# Patient Record
Sex: Female | Born: 1945 | Race: Black or African American | Hispanic: No | Smoking: Never smoker
Health system: Southern US, Community
[De-identification: ages and names within clinical notes are randomized; demographics above are authoritative.]

## PROBLEM LIST (undated history)

## (undated) DIAGNOSIS — I10 Essential (primary) hypertension: Secondary | ICD-10-CM

## (undated) DIAGNOSIS — R000 Tachycardia, unspecified: Secondary | ICD-10-CM

## (undated) DIAGNOSIS — N6019 Diffuse cystic mastopathy of unspecified breast: Secondary | ICD-10-CM

## (undated) DIAGNOSIS — J40 Bronchitis, not specified as acute or chronic: Secondary | ICD-10-CM

## (undated) DIAGNOSIS — K589 Irritable bowel syndrome without diarrhea: Secondary | ICD-10-CM

## (undated) DIAGNOSIS — I82A11 Acute embolism and thrombosis of right axillary vein: Secondary | ICD-10-CM

## (undated) DIAGNOSIS — IMO0002 Reserved for concepts with insufficient information to code with codable children: Secondary | ICD-10-CM

## (undated) DIAGNOSIS — I499 Cardiac arrhythmia, unspecified: Secondary | ICD-10-CM

## (undated) DIAGNOSIS — Z780 Asymptomatic menopausal state: Secondary | ICD-10-CM

## (undated) DIAGNOSIS — K7689 Other specified diseases of liver: Secondary | ICD-10-CM

## (undated) DIAGNOSIS — F329 Major depressive disorder, single episode, unspecified: Secondary | ICD-10-CM

## (undated) DIAGNOSIS — R079 Chest pain, unspecified: Secondary | ICD-10-CM

## (undated) DIAGNOSIS — R7302 Impaired glucose tolerance (oral): Secondary | ICD-10-CM

## (undated) DIAGNOSIS — I742 Embolism and thrombosis of arteries of the upper extremities: Secondary | ICD-10-CM

## (undated) DIAGNOSIS — F32A Depression, unspecified: Secondary | ICD-10-CM

## (undated) DIAGNOSIS — K5792 Diverticulitis of intestine, part unspecified, without perforation or abscess without bleeding: Secondary | ICD-10-CM

## (undated) HISTORY — DX: Major depressive disorder, single episode, unspecified: F32.9

## (undated) HISTORY — DX: Asymptomatic menopausal state: Z78.0

## (undated) HISTORY — PX: CHOLECYSTECTOMY: SHX55

## (undated) HISTORY — DX: Chest pain, unspecified: R07.9

## (undated) HISTORY — PX: TUBAL LIGATION: SHX77

## (undated) HISTORY — DX: Depression, unspecified: F32.A

## (undated) HISTORY — PX: BREAST SURGERY: SHX581

## (undated) HISTORY — PX: EXTERNAL FIXATION ANKLE FRACTURE: SHX1548

## (undated) HISTORY — DX: Other specified diseases of liver: K76.89

## (undated) HISTORY — DX: Impaired glucose tolerance (oral): R73.02

## (undated) HISTORY — PX: ABDOMINAL HYSTERECTOMY: SHX81

## (undated) HISTORY — PX: BACK SURGERY: SHX140

## (undated) HISTORY — DX: Diffuse cystic mastopathy of unspecified breast: N60.19

## (undated) HISTORY — PX: KNEE ARTHROCENTESIS: SUR44

## (undated) HISTORY — DX: Acute embolism and thrombosis of right axillary vein: I82.A11

## (undated) HISTORY — DX: Irritable bowel syndrome, unspecified: K58.9

---

## 2001-01-26 ENCOUNTER — Ambulatory Visit (HOSPITAL_COMMUNITY): Admission: RE | Admit: 2001-01-26 | Discharge: 2001-01-26 | Payer: Self-pay | Admitting: Cardiology

## 2001-02-27 ENCOUNTER — Encounter: Payer: Self-pay | Admitting: Family Medicine

## 2001-02-27 ENCOUNTER — Ambulatory Visit (HOSPITAL_COMMUNITY): Admission: RE | Admit: 2001-02-27 | Discharge: 2001-02-27 | Payer: Self-pay | Admitting: Family Medicine

## 2001-05-22 ENCOUNTER — Encounter: Payer: Self-pay | Admitting: Emergency Medicine

## 2001-05-22 ENCOUNTER — Emergency Department (HOSPITAL_COMMUNITY): Admission: EM | Admit: 2001-05-22 | Discharge: 2001-05-22 | Payer: Self-pay | Admitting: Emergency Medicine

## 2001-10-10 ENCOUNTER — Emergency Department (HOSPITAL_COMMUNITY): Admission: EM | Admit: 2001-10-10 | Discharge: 2001-10-10 | Payer: Self-pay | Admitting: Emergency Medicine

## 2002-04-02 ENCOUNTER — Ambulatory Visit (HOSPITAL_COMMUNITY): Admission: RE | Admit: 2002-04-02 | Discharge: 2002-04-02 | Payer: Self-pay | Admitting: Family Medicine

## 2002-04-02 ENCOUNTER — Encounter: Payer: Self-pay | Admitting: Family Medicine

## 2002-04-03 ENCOUNTER — Ambulatory Visit (HOSPITAL_COMMUNITY): Admission: RE | Admit: 2002-04-03 | Discharge: 2002-04-03 | Payer: Self-pay | Admitting: Family Medicine

## 2002-04-03 ENCOUNTER — Encounter: Payer: Self-pay | Admitting: Family Medicine

## 2002-07-20 ENCOUNTER — Encounter: Payer: Self-pay | Admitting: Family Medicine

## 2002-07-20 ENCOUNTER — Ambulatory Visit (HOSPITAL_COMMUNITY): Admission: RE | Admit: 2002-07-20 | Discharge: 2002-07-20 | Payer: Self-pay | Admitting: Family Medicine

## 2002-07-23 ENCOUNTER — Encounter: Payer: Self-pay | Admitting: Family Medicine

## 2002-07-23 ENCOUNTER — Ambulatory Visit (HOSPITAL_COMMUNITY): Admission: RE | Admit: 2002-07-23 | Discharge: 2002-07-23 | Payer: Self-pay | Admitting: Family Medicine

## 2002-08-03 ENCOUNTER — Encounter: Payer: Self-pay | Admitting: Family Medicine

## 2002-08-03 ENCOUNTER — Ambulatory Visit (HOSPITAL_COMMUNITY): Admission: RE | Admit: 2002-08-03 | Discharge: 2002-08-03 | Payer: Self-pay | Admitting: Family Medicine

## 2002-08-10 ENCOUNTER — Encounter: Payer: Self-pay | Admitting: Family Medicine

## 2002-08-10 ENCOUNTER — Ambulatory Visit (HOSPITAL_COMMUNITY): Admission: RE | Admit: 2002-08-10 | Discharge: 2002-08-10 | Payer: Self-pay | Admitting: Family Medicine

## 2003-05-08 ENCOUNTER — Ambulatory Visit (HOSPITAL_COMMUNITY): Admission: RE | Admit: 2003-05-08 | Discharge: 2003-05-08 | Payer: Self-pay | Admitting: Family Medicine

## 2003-05-08 ENCOUNTER — Encounter: Payer: Self-pay | Admitting: Family Medicine

## 2003-05-13 ENCOUNTER — Ambulatory Visit (HOSPITAL_COMMUNITY): Admission: RE | Admit: 2003-05-13 | Discharge: 2003-05-13 | Payer: Self-pay | Admitting: Family Medicine

## 2003-05-13 ENCOUNTER — Encounter: Payer: Self-pay | Admitting: Family Medicine

## 2003-08-29 ENCOUNTER — Ambulatory Visit (HOSPITAL_COMMUNITY): Admission: RE | Admit: 2003-08-29 | Discharge: 2003-08-29 | Payer: Self-pay | Admitting: Family Medicine

## 2003-09-09 ENCOUNTER — Ambulatory Visit (HOSPITAL_COMMUNITY): Admission: RE | Admit: 2003-09-09 | Discharge: 2003-09-09 | Payer: Self-pay | Admitting: Family Medicine

## 2004-07-03 ENCOUNTER — Ambulatory Visit (HOSPITAL_COMMUNITY): Admission: RE | Admit: 2004-07-03 | Discharge: 2004-07-03 | Payer: Self-pay | Admitting: Family Medicine

## 2004-07-16 ENCOUNTER — Encounter (HOSPITAL_COMMUNITY): Admission: RE | Admit: 2004-07-16 | Discharge: 2004-08-15 | Payer: Self-pay | Admitting: Family Medicine

## 2004-07-28 ENCOUNTER — Ambulatory Visit: Payer: Self-pay | Admitting: *Deleted

## 2004-07-28 ENCOUNTER — Observation Stay (HOSPITAL_COMMUNITY): Admission: AD | Admit: 2004-07-28 | Discharge: 2004-07-29 | Payer: Self-pay | Admitting: Family Medicine

## 2004-07-29 ENCOUNTER — Ambulatory Visit: Payer: Self-pay | Admitting: Cardiovascular Disease

## 2004-08-07 ENCOUNTER — Ambulatory Visit: Payer: Self-pay | Admitting: *Deleted

## 2004-10-23 ENCOUNTER — Ambulatory Visit (HOSPITAL_COMMUNITY): Admission: RE | Admit: 2004-10-23 | Discharge: 2004-10-23 | Payer: Self-pay | Admitting: Family Medicine

## 2004-12-04 ENCOUNTER — Ambulatory Visit (HOSPITAL_COMMUNITY): Admission: RE | Admit: 2004-12-04 | Discharge: 2004-12-04 | Payer: Self-pay | Admitting: Internal Medicine

## 2004-12-04 ENCOUNTER — Ambulatory Visit: Payer: Self-pay | Admitting: Internal Medicine

## 2004-12-04 HISTORY — PX: COLONOSCOPY: SHX174

## 2005-01-21 ENCOUNTER — Emergency Department (HOSPITAL_COMMUNITY): Admission: EM | Admit: 2005-01-21 | Discharge: 2005-01-21 | Payer: Self-pay | Admitting: Emergency Medicine

## 2005-04-23 ENCOUNTER — Ambulatory Visit (HOSPITAL_COMMUNITY): Admission: RE | Admit: 2005-04-23 | Discharge: 2005-04-23 | Payer: Self-pay | Admitting: Family Medicine

## 2005-07-14 ENCOUNTER — Ambulatory Visit (HOSPITAL_COMMUNITY): Admission: RE | Admit: 2005-07-14 | Discharge: 2005-07-14 | Payer: Self-pay | Admitting: Family Medicine

## 2005-07-17 ENCOUNTER — Ambulatory Visit (HOSPITAL_COMMUNITY): Admission: RE | Admit: 2005-07-17 | Discharge: 2005-07-17 | Payer: Self-pay | Admitting: Family Medicine

## 2005-08-13 ENCOUNTER — Ambulatory Visit (HOSPITAL_COMMUNITY): Admission: RE | Admit: 2005-08-13 | Discharge: 2005-08-13 | Payer: Self-pay | Admitting: Neurosurgery

## 2005-09-09 ENCOUNTER — Encounter: Admission: RE | Admit: 2005-09-09 | Discharge: 2005-09-09 | Payer: Self-pay | Admitting: Neurosurgery

## 2005-09-21 ENCOUNTER — Encounter: Admission: RE | Admit: 2005-09-21 | Discharge: 2005-09-21 | Payer: Self-pay | Admitting: Neurosurgery

## 2006-07-29 ENCOUNTER — Ambulatory Visit (HOSPITAL_COMMUNITY): Admission: RE | Admit: 2006-07-29 | Discharge: 2006-07-29 | Payer: Self-pay | Admitting: Family Medicine

## 2007-01-24 ENCOUNTER — Encounter: Payer: Self-pay | Admitting: Emergency Medicine

## 2007-01-24 ENCOUNTER — Inpatient Hospital Stay (HOSPITAL_COMMUNITY): Admission: AD | Admit: 2007-01-24 | Discharge: 2007-01-28 | Payer: Self-pay | Admitting: Cardiology

## 2007-01-24 ENCOUNTER — Ambulatory Visit: Payer: Self-pay | Admitting: Cardiology

## 2007-02-03 ENCOUNTER — Ambulatory Visit: Payer: Self-pay

## 2007-02-03 ENCOUNTER — Ambulatory Visit: Payer: Self-pay | Admitting: Cardiology

## 2007-02-06 ENCOUNTER — Emergency Department (HOSPITAL_COMMUNITY): Admission: EM | Admit: 2007-02-06 | Discharge: 2007-02-06 | Payer: Self-pay | Admitting: Emergency Medicine

## 2007-02-09 ENCOUNTER — Ambulatory Visit (HOSPITAL_COMMUNITY): Admission: RE | Admit: 2007-02-09 | Discharge: 2007-02-09 | Payer: Self-pay | Admitting: Cardiology

## 2007-02-09 ENCOUNTER — Ambulatory Visit: Payer: Self-pay | Admitting: Cardiology

## 2007-02-24 ENCOUNTER — Ambulatory Visit: Payer: Self-pay | Admitting: Cardiology

## 2007-04-10 ENCOUNTER — Emergency Department (HOSPITAL_COMMUNITY): Admission: EM | Admit: 2007-04-10 | Discharge: 2007-04-10 | Payer: Self-pay | Admitting: Emergency Medicine

## 2007-08-04 ENCOUNTER — Ambulatory Visit (HOSPITAL_COMMUNITY): Admission: RE | Admit: 2007-08-04 | Discharge: 2007-08-04 | Payer: Self-pay | Admitting: Family Medicine

## 2007-08-18 ENCOUNTER — Ambulatory Visit (HOSPITAL_COMMUNITY): Admission: RE | Admit: 2007-08-18 | Discharge: 2007-08-18 | Payer: Self-pay | Admitting: Family Medicine

## 2007-09-29 ENCOUNTER — Ambulatory Visit: Payer: Self-pay | Admitting: Internal Medicine

## 2007-10-06 ENCOUNTER — Ambulatory Visit (HOSPITAL_COMMUNITY): Admission: RE | Admit: 2007-10-06 | Discharge: 2007-10-06 | Payer: Self-pay | Admitting: Internal Medicine

## 2007-10-06 ENCOUNTER — Ambulatory Visit: Payer: Self-pay | Admitting: Internal Medicine

## 2007-10-06 ENCOUNTER — Encounter: Payer: Self-pay | Admitting: Internal Medicine

## 2007-11-03 ENCOUNTER — Ambulatory Visit: Payer: Self-pay | Admitting: Internal Medicine

## 2007-11-17 ENCOUNTER — Encounter (HOSPITAL_COMMUNITY): Admission: RE | Admit: 2007-11-17 | Discharge: 2007-12-17 | Payer: Self-pay | Admitting: Internal Medicine

## 2008-02-26 ENCOUNTER — Ambulatory Visit (HOSPITAL_COMMUNITY): Admission: EM | Admit: 2008-02-26 | Discharge: 2008-02-26 | Payer: Self-pay | Admitting: Emergency Medicine

## 2008-02-26 ENCOUNTER — Ambulatory Visit: Payer: Self-pay | Admitting: Gastroenterology

## 2008-02-26 ENCOUNTER — Encounter: Payer: Self-pay | Admitting: Gastroenterology

## 2008-07-16 ENCOUNTER — Encounter (HOSPITAL_COMMUNITY): Admission: RE | Admit: 2008-07-16 | Discharge: 2008-08-15 | Payer: Self-pay | Admitting: Family Medicine

## 2008-08-09 ENCOUNTER — Ambulatory Visit (HOSPITAL_COMMUNITY): Admission: RE | Admit: 2008-08-09 | Discharge: 2008-08-09 | Payer: Self-pay | Admitting: Family Medicine

## 2008-08-29 DIAGNOSIS — F329 Major depressive disorder, single episode, unspecified: Secondary | ICD-10-CM

## 2008-08-29 DIAGNOSIS — M1990 Unspecified osteoarthritis, unspecified site: Secondary | ICD-10-CM

## 2008-08-29 DIAGNOSIS — K7689 Other specified diseases of liver: Secondary | ICD-10-CM | POA: Insufficient documentation

## 2008-08-29 DIAGNOSIS — E7800 Pure hypercholesterolemia, unspecified: Secondary | ICD-10-CM

## 2008-08-29 DIAGNOSIS — K5900 Constipation, unspecified: Secondary | ICD-10-CM | POA: Insufficient documentation

## 2008-08-29 DIAGNOSIS — F3289 Other specified depressive episodes: Secondary | ICD-10-CM | POA: Insufficient documentation

## 2008-08-29 DIAGNOSIS — R110 Nausea: Secondary | ICD-10-CM

## 2008-08-29 DIAGNOSIS — K449 Diaphragmatic hernia without obstruction or gangrene: Secondary | ICD-10-CM | POA: Insufficient documentation

## 2008-08-29 DIAGNOSIS — I10 Essential (primary) hypertension: Secondary | ICD-10-CM

## 2008-08-29 DIAGNOSIS — R109 Unspecified abdominal pain: Secondary | ICD-10-CM | POA: Insufficient documentation

## 2008-08-29 DIAGNOSIS — K279 Peptic ulcer, site unspecified, unspecified as acute or chronic, without hemorrhage or perforation: Secondary | ICD-10-CM | POA: Insufficient documentation

## 2008-08-29 DIAGNOSIS — K920 Hematemesis: Secondary | ICD-10-CM

## 2008-08-30 HISTORY — PX: CARDIAC CATHETERIZATION: SHX172

## 2008-10-11 ENCOUNTER — Inpatient Hospital Stay (HOSPITAL_COMMUNITY): Admission: RE | Admit: 2008-10-11 | Discharge: 2008-10-17 | Payer: Self-pay | Admitting: Neurosurgery

## 2008-11-12 ENCOUNTER — Encounter (HOSPITAL_COMMUNITY): Admission: RE | Admit: 2008-11-12 | Discharge: 2008-12-12 | Payer: Self-pay | Admitting: Neurosurgery

## 2008-12-13 ENCOUNTER — Encounter (HOSPITAL_COMMUNITY): Admission: RE | Admit: 2008-12-13 | Discharge: 2009-01-12 | Payer: Self-pay | Admitting: Neurosurgery

## 2008-12-18 ENCOUNTER — Emergency Department (HOSPITAL_COMMUNITY): Admission: EM | Admit: 2008-12-18 | Discharge: 2008-12-18 | Payer: Self-pay | Admitting: Emergency Medicine

## 2009-02-03 ENCOUNTER — Encounter (HOSPITAL_COMMUNITY): Admission: RE | Admit: 2009-02-03 | Discharge: 2009-03-05 | Payer: Self-pay | Admitting: Neurosurgery

## 2009-07-21 ENCOUNTER — Ambulatory Visit (HOSPITAL_COMMUNITY): Admission: RE | Admit: 2009-07-21 | Discharge: 2009-07-21 | Payer: Self-pay | Admitting: Family Medicine

## 2009-12-15 ENCOUNTER — Encounter (INDEPENDENT_AMBULATORY_CARE_PROVIDER_SITE_OTHER): Payer: Self-pay

## 2010-03-13 ENCOUNTER — Inpatient Hospital Stay (HOSPITAL_COMMUNITY): Admission: EM | Admit: 2010-03-13 | Discharge: 2010-03-17 | Payer: Self-pay | Admitting: Emergency Medicine

## 2010-09-17 ENCOUNTER — Ambulatory Visit (HOSPITAL_COMMUNITY)
Admission: RE | Admit: 2010-09-17 | Discharge: 2010-09-17 | Payer: Self-pay | Source: Home / Self Care | Attending: Family Medicine | Admitting: Family Medicine

## 2010-09-29 NOTE — Letter (Signed)
Summary: Recall Colonoscopy/Endoscopy, Change to Office Visit  [facility]  [address]   [place], [place] [address]   Phone: [phone]  Fax: [phone]      December 15, 2009   [name] [address] [place], [place]  [address] 1946-02-10   Dear [name],   According to our records, it is time for you to schedule a Colonoscopy/Endoscopy. However, after reviewing your medical record, we recommend an office visit in order to determine your need for a repeat procedure.  Please call [phone] at your convenience to schedule an office visit. If you have any questions or concerns, please feel free to contact our office.   Sincerely,   Cloria Spring LPN  [facility] Ph: [phone]   Fax: [phone]

## 2010-11-14 LAB — DIFFERENTIAL
Basophils Absolute: 0 10*3/uL (ref 0.0–0.1)
Basophils Absolute: 0 K/uL (ref 0.0–0.1)
Basophils Absolute: 0.1 10*3/uL (ref 0.0–0.1)
Basophils Relative: 0 % (ref 0–1)
Basophils Relative: 1 % (ref 0–1)
Basophils Relative: 1 % (ref 0–1)
Eosinophils Absolute: 0.1 10*3/uL (ref 0.0–0.7)
Eosinophils Absolute: 0.2 10*3/uL (ref 0.0–0.7)
Eosinophils Absolute: 0.2 K/uL (ref 0.0–0.7)
Eosinophils Relative: 5 % (ref 0–5)
Lymphocytes Relative: 33 % (ref 12–46)
Lymphs Abs: 1.6 K/uL (ref 0.7–4.0)
Monocytes Absolute: 0.7 10*3/uL (ref 0.1–1.0)
Monocytes Absolute: 0.8 K/uL (ref 0.1–1.0)
Monocytes Relative: 13 % — ABNORMAL HIGH (ref 3–12)
Monocytes Relative: 17 % — ABNORMAL HIGH (ref 3–12)
Neutro Abs: 2.2 K/uL (ref 1.7–7.7)
Neutro Abs: 2.5 10*3/uL (ref 1.7–7.7)
Neutro Abs: 7 10*3/uL (ref 1.7–7.7)
Neutrophils Relative %: 43 % (ref 43–77)
Neutrophils Relative %: 45 % (ref 43–77)
Neutrophils Relative %: 68 % (ref 43–77)

## 2010-11-14 LAB — CBC
HCT: 33 % — ABNORMAL LOW (ref 36.0–46.0)
HCT: 39.5 % (ref 36.0–46.0)
Hemoglobin: 10.4 g/dL — ABNORMAL LOW (ref 12.0–15.0)
Hemoglobin: 10.9 g/dL — ABNORMAL LOW (ref 12.0–15.0)
Hemoglobin: 13 g/dL (ref 12.0–15.0)
MCH: 25.2 pg — ABNORMAL LOW (ref 26.0–34.0)
MCH: 25.3 pg — ABNORMAL LOW (ref 26.0–34.0)
MCH: 25.5 pg — ABNORMAL LOW (ref 26.0–34.0)
MCHC: 32.9 g/dL (ref 30.0–36.0)
MCHC: 32.9 g/dL (ref 30.0–36.0)
MCV: 76.5 fL — ABNORMAL LOW (ref 78.0–100.0)
MCV: 77.5 fL — ABNORMAL LOW (ref 78.0–100.0)
Platelets: 222 K/uL (ref 150–400)
Platelets: 242 10*3/uL (ref 150–400)
Platelets: 275 K/uL (ref 150–400)
RBC: 4.11 MIL/uL (ref 3.87–5.11)
RBC: 4.26 MIL/uL (ref 3.87–5.11)
RBC: 5.16 MIL/uL — ABNORMAL HIGH (ref 3.87–5.11)
RDW: 17.6 % — ABNORMAL HIGH (ref 11.5–15.5)
RDW: 17.9 % — ABNORMAL HIGH (ref 11.5–15.5)
WBC: 10.3 K/uL (ref 4.0–10.5)
WBC: 4.8 10*3/uL (ref 4.0–10.5)
WBC: 5.8 K/uL (ref 4.0–10.5)

## 2010-11-14 LAB — COMPREHENSIVE METABOLIC PANEL WITH GFR
ALT: 18 U/L (ref 0–35)
ALT: 21 U/L (ref 0–35)
AST: 20 U/L (ref 0–37)
AST: 21 U/L (ref 0–37)
Albumin: 2.9 g/dL — ABNORMAL LOW (ref 3.5–5.2)
Albumin: 3.8 g/dL (ref 3.5–5.2)
Alkaline Phosphatase: 61 U/L (ref 39–117)
Alkaline Phosphatase: 87 U/L (ref 39–117)
BUN: 13 mg/dL (ref 6–23)
BUN: 4 mg/dL — ABNORMAL LOW (ref 6–23)
CO2: 28 meq/L (ref 19–32)
CO2: 29 meq/L (ref 19–32)
Calcium: 8.3 mg/dL — ABNORMAL LOW (ref 8.4–10.5)
Calcium: 9.5 mg/dL (ref 8.4–10.5)
Chloride: 105 meq/L (ref 96–112)
Chloride: 98 meq/L (ref 96–112)
Creatinine, Ser: 0.84 mg/dL (ref 0.4–1.2)
Creatinine, Ser: 0.87 mg/dL (ref 0.4–1.2)
GFR calc non Af Amer: >60 mL/min
GFR calc non Af Amer: >60 mL/min
Glucose, Bld: 106 mg/dL — ABNORMAL HIGH (ref 70–99)
Glucose, Bld: 96 mg/dL (ref 70–99)
Potassium: 3.9 meq/L (ref 3.5–5.1)
Potassium: 3.9 meq/L (ref 3.5–5.1)
Sodium: 135 meq/L (ref 135–145)
Sodium: 136 meq/L (ref 135–145)
Total Bilirubin: 0.4 mg/dL (ref 0.3–1.2)
Total Bilirubin: 0.7 mg/dL (ref 0.3–1.2)
Total Protein: 6.2 g/dL (ref 6.0–8.3)
Total Protein: 7.5 g/dL (ref 6.0–8.3)

## 2010-11-14 LAB — BASIC METABOLIC PANEL
CO2: 28 mEq/L (ref 19–32)
Calcium: 8.1 mg/dL — ABNORMAL LOW (ref 8.4–10.5)
Creatinine, Ser: 0.8 mg/dL (ref 0.4–1.2)
GFR calc Af Amer: >60 mL/min (ref >60)
GFR calc non Af Amer: >60 mL/min (ref >60)
Glucose, Bld: 107 mg/dL — ABNORMAL HIGH (ref 70–99)
Sodium: 135 mEq/L (ref 135–145)

## 2010-11-14 LAB — IRON AND TIBC
Iron: 41 ug/dL — ABNORMAL LOW (ref 42–135)
Saturation Ratios: 16 % — ABNORMAL LOW (ref 20–55)
TIBC: 250 ug/dL (ref 250–470)
UIBC: 209 ug/dL

## 2010-11-14 LAB — URINALYSIS, ROUTINE W REFLEX MICROSCOPIC
Bilirubin Urine: NEGATIVE
Glucose, UA: NEGATIVE mg/dL
Hgb urine dipstick: NEGATIVE
Ketones, ur: NEGATIVE mg/dL
Nitrite: NEGATIVE
Protein, ur: NEGATIVE mg/dL
Specific Gravity, Urine: 1.01 (ref 1.005–1.030)
Urobilinogen, UA: 0.2 mg/dL (ref 0.0–1.0)
pH: 5 (ref 5.0–8.0)

## 2010-11-14 LAB — HEMOCCULT GUIAC POC 1CARD (OFFICE): Fecal Occult Bld: NEGATIVE

## 2010-11-14 LAB — LIPASE, BLOOD: Lipase: 22 U/L (ref 11–59)

## 2010-12-15 LAB — COMPREHENSIVE METABOLIC PANEL
ALT: 27 U/L (ref 0–35)
Alkaline Phosphatase: 74 U/L (ref 39–117)
BUN: 18 mg/dL (ref 6–23)
CO2: 29 mEq/L (ref 19–32)
GFR calc non Af Amer: >60 mL/min (ref >60)
Glucose, Bld: 99 mg/dL (ref 70–99)
Potassium: 4.4 mEq/L (ref 3.5–5.1)
Sodium: 140 mEq/L (ref 135–145)

## 2010-12-15 LAB — CBC
HCT: 41.5 % (ref 36.0–46.0)
Hemoglobin: 13.8 g/dL (ref 12.0–15.0)
MCHC: 33.3 g/dL (ref 30.0–36.0)
RBC: 5.27 MIL/uL — ABNORMAL HIGH (ref 3.87–5.11)

## 2010-12-15 LAB — PROTIME-INR: Prothrombin Time: 13 seconds (ref 11.6–15.2)

## 2011-01-12 NOTE — Assessment & Plan Note (Signed)
[facility]                            CARDIOLOGY OFFICE NOTE   NAME:[name]               MRN:          [id number]  DATE:02/03/2007                            DOB:          [birth year]    PRIMARY CARE PHYSICIAN:  Scott A. Gerda Diss, M.D.   HISTORY OF PRESENT ILLNESS:  [name] is a 65 year old woman, who  underwent cardiac catheterization on May 27 by Dr. Gala Romney.  She tells  me she had a hematoma in her groin upon discharge.  It has continued to  be sore.  She has not had any further syncope or presyncope.  She has  had no fevers, chills or night sweats.  She was recently prescribed some  Darvocet N 100 for this.  She has taken this only once.  She has not had  any calf or foot discoloration or pain.   CURRENT MEDICATIONS:  1. Aspirin 81 mg daily.  2. Lipitor 10 mg daily.   Ultrasound of her groin shows no AV fistula or pseudo-aneurysm.  There  is an overlying hematoma, without impairment of arterial or venous flow.   EXAM:  She is generally well-appearing, in no distress.  The heart rate  is 56, blood pressure 134/69, weight of 213 pounds.  Respiratory effort  is normal.  Lungs are clear to auscultation.  She has a nondisplaced  point of maximal cardiac impulse.  There is a regular rate and rhythm  without murmur, rub or gallop.  The abdomen is soft, nondistended,  nontender.  There is no hepatosplenomegaly.  Bowel sounds are normal.  Right leg has a large hematoma with ecchymosis extending to the mid-  thigh.  Patient says this is no different than when she went home.   IMPRESSION/RECOMMENDATIONS:  Large groin hematoma.  I told her to  continue with the Darvocet on a p.r.n. basis.  Follow up with Dr.  Dietrich Pates in two weeks.     Salvadore Farber, MD  Electronically Signed    WED/MedQ  DD: 02/03/2007  DT: 02/03/2007  Job #: [id number]   cc:   Lorin Picket A. Gerda Diss, MD  Gerrit Friends. Dietrich Pates, MD, [facility]

## 2011-01-12 NOTE — Assessment & Plan Note (Signed)
NAMEMarland Kitchen  [name], [name]   CHIEF COMPLAINT:  Followup right upper quadrant upper abdominal pain and  peptic ulcer disease.   SUBJECTIVE:  The patient is a 65 year old  female.  She was evaluated by  Dr. Jena Gauss for history of chronic epigastric and upper abdominal  discomfort.  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  It has increased in size, but appears to be benign by  ultrasound and CT imaging.  Her LFTs are completely normal.  Her gall  bladder is in situ.  She had an EGD which showed three prepyloric antral  ulcers measuring 2 to 3 mm in dimension.  She had biopsy which showed  mild chronic gastritis and focal mucosal erosion without presence of H.  pylori.  She had negative H. pylori serology.  She denies any NSAID use.  She continues to complain of a band like rope around her upper abdomen  which intermittently tightens.  She describes the pain as lasting about  three minutes.  She does note that the pain radiates around to her right  flank and right mid back.  She does have some nausea, but denies any  vomiting.  The pain does seem to worsen with movement.  At times she  feels as though she is going to pass out.  She has noticed decreased  urinary frequency for about the last week.  She has noted some chills as  well.  She rates the pain 9/10 on pain scale.  She does take lactulose  b.i.d. for constipation.  She has previously failed MiraLax.  She does  work as a Government social research officer and does do some twisting and pulling with  work as well.  She has been taking Protonix 40 mg daily since her EGD.  She has been taking Darvocet p.r.n. for the pain, which does seem to  help.   CURRENT MEDICATIONS:  See the list from 11/03/2007.   ALLERGIES:  Penicillin.   OBJECTIVE:  VITAL SIGNS:  Weight 218 pounds.  Height 65 inches.  Temperature 97.8.  Blood pressure 142/84  and pulse 60.  GENERAL:  [name] is a 65 year old obese female who is alert,  oriented, pleasant, cooperative, in no acute distress.  HEENT:  Sclerae clear, nonicteric.  Conjunctivae pink.  Oropharynx pink  and moist without any lesions.  CHEST:  Heart regular rate and rhythm.  Normal S1, S2 without any  murmurs, clicks, rubs or gallops.  ABDOMEN:  Protuberant with positive bowel sounds x4.  No bruits  auscultated.  Soft, nondistended.  She does have right costal margin  tenderness as well as tenderness to the right flank.  She has abdominal  wall tenderness with a positive Carnett sign.  She does not have rebound  tenderness or guarding.  Am unable to palpate hepatosplenomegaly or  mass.  Exam is limited given patient's body habitus.  EXTREMITIES:  Without clubbing or edema bilaterally.   ASSESSMENT:  1. The patient is a 65 year old female with peptic ulcer disease on      proton pump inhibitor.  She will need followup EGD in three months      to verify healing.  2. She has chronic upper abdominal pain mostly right upper quadrant  and etiology of this pain is unclear.  It remains to be seen how      much pain may be attributed to a large benign appearing hepatic      cyst versus abdominal wall pain versus biliary etiology.  3. She also has some decreased urinary frequency and given her right      flank pain, we will check a urinalysis today.   PLAN:  1. EGD in three months, which would be around May 6. 2009.  2. Check a UA.  3. I have discussed this case with Dr. Jena Gauss and he would like to      pursue a HIDA scan prior to drainage of large hepatic cyst through      interventional radiology.  4. Darvocet-N 100, one p.o. q. 4-6 hours p.r.n. severe pain #20 with      no refills.  5. Continue Protonix 40 mg daily.  6. Further recommendations pending HIDA scan.       Lorenza Burton, N.P.  Electronically Signed     R. Roetta Sessions, M.D.  Electronically Signed     KJ/MEDQ  D:  11/03/2007  T:  11/03/2007  Job:  [id number]   cc:   Lorin Picket A. Gerda Diss, MD

## 2011-01-12 NOTE — Op Note (Signed)
[name], [name]        ACCOUNT NO.:  [id number]   MEDICAL RECORD NO.:  [id number]          PATIENT TYPE:  AMB   LOCATION:  DAY                           FACILITY:  [facility]   PHYSICIAN:  R. Roetta Sessions, M.D. DATE OF BIRTH:  Jun 19, 1946   DATE OF PROCEDURE:  10/06/2007  DATE OF DISCHARGE:                               OPERATIVE REPORT   PROCEDURE:  Esophagogastroduodenoscopy with biopsy.   INDICATIONS FOR PROCEDURE:  A 65 year old lady with a several month  history of epigastric upper abdominal discomfort.  She has a known large  hepatic cyst that dates back a good 10 years.  Her LFTs are completely  normal.  Abdominal ultrasound also came back negative as far as  gallbladder, etc., is concerned.  Otherwise, EGD is now being done.  This approach has been discussed with the patient at length.  Potential  risks, benefits and alternatives have been reviewed and questions  answered.  She is agreeable.  Please see documentation on the medical  record.   PROCEDURE NOTE:  O2 saturation, blood pressure, pulses, and respirations  were monitored throughout the entire procedure.  Conscious sedation with  Versed 4 mg IV and Demerol 75 mg IV in divided doses.   INSTRUMENT:  Pentax video chip system.   FINDINGS:  Examination of the tubular esophagus revealed no mucosal  abnormalities.  EG junction was easily traversed.   STOMACH:  Gastric cavity was empty and insufflated well with air.  A  thorough examination of the gastric mucosa, including a retroflexed view  of the proximal stomach and esophagogastric junction demonstrated three  prepyloric antral ulcers, approximately 2-3 mm in dimensions.  They  appeared to be benign lesions.  The remainder of the gastric mucosa  appeared normal.  The pylorus was patent and easily transversed.  Examination of the bulb and second portion revealed no abnormalities.   THERAPEUTICS/DIAGNOSTIC MANEUVERS PERFORMED:  The ulcerated areas were  biopsied for histologic study.  Patient tolerated the procedure well and  was reactive.   ENDOSCOPY IMPRESSION:  Normal esophagus with three small prepyloric  antral ulcers, otherwise normal gastric mucosa and patent pylorus,  normal D1, D2.   These are suspicious for NSAID-induced lesions; however, there is no  history of NSAID use.  These lesions could easily be contributing to  this lady's epigastric pain.   RECOMMENDATIONS:  1. We will follow up on path, obtain H. pylori serology.  2. We will begin Protonix 40 mg orally daily.  3. Further recommendations to follow.      Jonathon Bellows, M.D.  Electronically Signed     RMR/MEDQ  D:  10/06/2007  T:  10/07/2007  Job:  [id number]   cc:   Dr. Gerda Diss

## 2011-01-12 NOTE — H&P (Signed)
NAMEMarland [name]  MLISS, WEDIN NO.:  [id number]   MEDICAL RECORD NO.:  [id number]          PATIENT TYPE:  INP   LOCATION:  4735                         FACILITY:  [facility]   PHYSICIAN:  Luis Abed, MD, FACCDATE OF BIRTH:  28-Sep-1945   DATE OF ADMISSION:  01/24/2007  DATE OF DISCHARGE:                              HISTORY & PHYSICAL   CARDIOLOGIST:  She had previously been seen by Dr. Vida Roller in  [place]; she will be new to Dr. Ambler Bing.   PRIMARY CARE PHYSICIAN:  Dr. Lilyan Punt.   CHIEF COMPLAINT:  Chest pain.   HISTORY OF PRESENT ILLNESS:  [name] is a 65 year old female  patient with a history of normal coronary arteries by catheterization,  November 2005, who presents to [facility] today in transfer  from [facility] Room with complaints of chest pain.  She began to notice left-sided chest discomfort about a month ago,  somewhat made worse by upper body movements.  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  With lifting or pushing, she  noticed more chest discomfort.  She walks on a daily basis and rides a  stationary bicycle 3 times a week.  Over the last month since she has  been noticing chest discomfort, she denies any exertional chest pain.  Yesterday, she noted that she was quite fatigued.  She began to notice  worsening left-sided chest discomfort; this was off and on throughout  the day.  She had difficulty sleeping last night and thinks that the  chest pain woke her up several times.  She noted shortness of breath  with this as well as dyspnea with exertion.  She did note some nausea as  well as radiation to her left arm today.  She also noted some  diaphoresis.  She eventually called her primary care physician and was  instructed to the emergency room.  Thus far EKG and point-of-care  markers have been negative.  She has received nitroglycerin x3 with  possibly some relief.   She still having some mild chest discomfort at  this time.   PAST MEDICAL HISTORY:  1. Normal coronaries by catheterization, July 29, 2004.  2. EF 65% by catheterization, November 2005.  3. Hypertension.  4. Diet-controlled diabetes mellitus.  5. Gastroesophageal reflux disease/hiatal hernia.  6. Status post total abdominal hysterectomy/bilateral salpingo-      oophorectomy.  7. Status post left knee surgery.   ALLERGIES:  PENICILLIN.   MEDICATIONS PRIOR TO ADMISSION:  1. Atenolol, unknown dosage, half a tablet twice a day.  2. Hydrochlorothiazide 25 mg daily.  3. Axid -- she ran out of this a month ago.   SOCIAL HISTORY:  She lives in [place] with her husband.  She has 3  children.  She denies any tobacco or alcohol abuse.  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   FAMILY HISTORY:  Significant for stroke in her mother at an unknown age.  She has 2 brothers, both with coronary disease; 1 had bypass in his 60s;  1 had a  myocardial infarction in his 50s.   REVIEW OF SYSTEMS:  Please see HPI.  Denies any fevers, chills or cough.  No nocturia, hematuria or dysuria.  She denies any orthopnea or  paroxysmal dyspnea.  She does have occasional bilateral ankle edema.  She did note some palpitations today while she was in the hospital at  [facility].  Her husband notes that she snores and she does  admit to some occasional daytime hypersomnolence.  She also does admit  to decreased sleep.  She does admit to occasional dysphagia with certain  types of foods.  She also notes occasional chest discomfort with certain  types of foods.  She does note water brash symptoms.  The rest of the  review of systems are negative.   PHYSICAL EXAM:  GENERAL:  She is a well-nourished, well-developed  female.  VITAL SIGNS:  She has blood pressure of 120/58, pulse 55, respirations  20, temperature 97.7, oxygen saturation of 100% on 2 L.   HEENT:  Normal.  NECK:  Without JVD.  LYMPHATICS:  Without lymphadenopathy.  ENDOCRINE:  Without thyromegaly.  VASCULAR:  Carotids without bruits bilaterally.  CARDIAC:  Normal S1 and S2.  Regular rate and rhythm without murmur.  LUNGS:  Clear to auscultation bilaterally without wheezing, rhonchi or  rales.  ABDOMEN:  Soft and nontender with normoactive bowel sounds, no  organomegaly, no rebound, no guarding.  EXTREMITIES:  Without clubbing, cyanosis or edema.  MUSCULOSKELETAL:  Without spine or CVA tenderness.  She does have left-  sided chest discomfort with palpation.  NEUROLOGIC:  She is alert and  oriented x3.  Cranial nerves II-XII are grossly intact.  SKIN:  Warm and dry.   RADIOLOGY AND ACCESSORY CLINICAL DATA:  Chest x-ray reveals no acute  disease.   EKG reveals sinus bradycardia with a heart rate of 54, left axis  deviation, nonspecific ST-T wave changes.   LABORATORY DATA:  Sodium 140, potassium 3.7, BUN 20, creatinine 0.84,  glucose 104.  Hemoglobin 12.9, white count 5600, platelet count 245,000.  Point-of-care markers negative x1.   IMPRESSION:  1. Normal coronaries by catheterization, November 2005.  2. Good left ventricular function.  3. Hypertension.  4. Gastroesophageal reflux disease.  5. Diet-controlled diabetes mellitus.  6. Fatigue/daytime hypersomnolence.      a.     Rule out sleep apnea.  7. Family history of coronary artery disease.  8. Obesity.   PLAN:  The patient was also interviewed and examined by Dr. Willa Rough.  At this point in time, we plan to admit the patient for further  evaluation.  She will be maintained on telemetry and we will treat her  with aspirin and Lovenox 1 mg/kg subcu q.12 h.  Her atenolol dosage is  unknown.  We will continue her on Lopressor 12.5 mg twice a day.  We  will also give her Protonix 40 mg daily; will start with an IV dose  today and continue with p.o. dose tomorrow.  She will also be given Ultram p.r.n.  pain.  If her enzymes return positive for myocardial  infarction, she will go for cardiac catheterization tomorrow.  If her  enzymes return negative and she is feeling better in the morning, we  will plan on discharge with outpatient cardiac workup and  gastroenterology evaluation.  If she is still having symptoms in the  morning, then we will proceed with in-hospital stress Myoview scan.  Her  pain is certainly atypical.  She may require  inpatient gastroenterology  evaluation.  Given her fatigue, we will check a TSH.  She should also be  considered for an outpatient sleep study; this would be to rule out  sleep apnea.  We will start by screening her with an overnight pulse  oximetry tonight.      Tereso Newcomer, PA-C      Luis Abed, MD, [facility]  Electronically Signed    SW/MEDQ  D:  01/24/2007  T:  01/25/2007  Job:  [id number]

## 2011-01-12 NOTE — Op Note (Signed)
NAMEDAPHYNE, MIGUEZ        ACCOUNT NO.:  [id number]   MEDICAL RECORD NO.:  [id number]          PATIENT TYPE:  OIB   LOCATION:  3025                         FACILITY:  [facility]   PHYSICIAN:  Coletta Memos, M.D.     DATE OF BIRTH:  08-22-1946   DATE OF PROCEDURE:  10/11/2008  DATE OF DISCHARGE:                               OPERATIVE REPORT   PREOPERATIVE DIAGNOSIS:  Right lower extremity pain.   POSTOPERATIVE DIAGNOSIS:  Right lower extremity pain.   PROCEDURE:  Right L4-5 semihemilaminectomy and diskectomy with  microdissection.   COMPLICATIONS:  None.   FINDINGS:  Thin dura with some dural leaking.  There was no tear in the  dura.  There was no pooching of arachnoid.   OPERATIVE NOTE:  Ms. Enrico was brought to the operating room,  intubated and placed under general anesthetic without difficulty.  She  was rolled prone onto a Wilson frame and all pressure points were  properly padded.  Her back was prepped.  She was draped in a sterile  fashion.  I infiltrated 20 mL of 0.5% lidocaine with 1:200,000 strength  epinephrine into the lumbar region.  I then opened the skin with a #10  blade and I took this initial incision down through the subcutaneous fat  which was significant to the thoracolumbar fascia.  I then exposed the  lamina of L4 and L5 on the right side.  I then performed the  semihemilaminectomy using a high-speed electric drill, removed the  ligament and exposed the thecal sac between L4 and L5.  The microscope  was brought into the operative field and with microdissection, Dr.  Newell Coral and I, then proceeded with the diskectomy.   I opened the disk space and removal was a great deal, of degenerated  disk material.  There was also a good deal of material just in the  lateral recess going out into the neuroforamen.  That was removed also  without difficulty.  I, when retracting the thecal sac, noticed that  there was a very thin area that was leaking spinal  fluid.  There was no  visible tear that I could see.  There was certainly nothing to sew  together though.  We thoroughly decompressed the lateral side of the  disk space on the right side.  I felt that there were no free spaces  left.  Thecal sac and nerve root were decompressed.  I placed a piece of  DuraGen overlying this thin area.  The thecal sac remained inflated  throughout the entire operation.  We had no unusual epidural bleeding  either to indicate that there was a significant loss of spinal fluid.  I  then closed the wound in layered fashion using Vicryl sutures to  reapproximate the thoracolumbar subcutaneous and subcuticular layers.  I  then used a 3-0 nylon suture to reapproximate the skin edges.  Sterile  dressing was applied.           ______________________________  Coletta Memos, M.D.     KC/MEDQ  D:  10/11/2008  T:  10/12/2008  Job:  [id number]

## 2011-01-12 NOTE — Discharge Summary (Signed)
[name], [name]        ACCOUNT NO.:  [id number]   MEDICAL RECORD NO.:  [id number]          PATIENT TYPE:  INP   LOCATION:  3025                         FACILITY:  [facility]   PHYSICIAN:  Coletta Memos, M.D.     DATE OF BIRTH:  13-Apr-1946   DATE OF ADMISSION:  10/11/2008  DATE OF DISCHARGE:  10/17/2008                               DISCHARGE SUMMARY   ADMITTING DIAGNOSIS:  Displaced disk, right L4-5.   DISCHARGE DIAGNOSIS:  Displaced disk, right L4-5.   PROCEDURE:  Right L4-5 semi-hemilaminectomy and diskectomy with  microdissection.   COMPLICATIONS:  None.   INDICATIONS:  [name] presented with right lower extremity pain  of several months' duration.  She had no relief with conservative  treatment and eventually sought to undergo operative decompression.  She  was taken to the operating room on October 11, 2008.  At that time, the  disk was removed.  She had a very thin dura and there was some dural  leaking from this area.  No tear was identified.  A piece of Duragen was  placed and she was kept flat for 72 hours.  Postoperatively, she had  good deal of pain in her back.  She said her right lower extremity felt  better, had some numbness in her great toe on the right side.  At  discharge, she is alert, oriented x4, answering all questions  appropriately.  Memory length attention, span, and fund of knowledge are  all normal.  No leak from the wound.  No headache when she gets up.   DISCHARGE MEDICATIONS:  Vicodin and Flexeril.           ______________________________  Coletta Memos, M.D.     KC/MEDQ  D:  10/17/2008  T:  10/18/2008  Job:  [id number]

## 2011-01-12 NOTE — Cardiovascular Report (Signed)
[name], BARIL NO.:  [id number]   MEDICAL RECORD NO.:  [id number]          PATIENT TYPE:  INP   LOCATION:  4735                         FACILITY:  [facility]   PHYSICIAN:  Bevelyn Buckles. Bensimhon, MDDATE OF BIRTH:  1946/02/22   DATE OF PROCEDURE:  01/25/2007  DATE OF DISCHARGE:                            CARDIAC CATHETERIZATION   PRIMARY CARE PHYSICIAN:  Dr. Lilyan Punt.   CARDIOLOGIST:  Dr. Donnamarie Rossetti.   PATIENT IDENTIFICATION:  [name] is a 65 year old woman with  history of hypertension, diabetes and gastroesophageal reflux disease.  She underwent cardiac catheterization in November 2005 which showed  normal coronaries.  She is relatively active at baseline.  Last night,  she was admitted with chest pain concerning for unstable angina.  EKG  and cardiac markers were normal.  We have discussed the risks and  benefits of cardiac catheterization, and she and her family were eager  to proceed with catheterization.   PROCEDURES PERFORMED:  1. Selective coronary angiography.  2. Left heart catheterization.  3. Left ventriculogram.   DESCRIPTION OF PROCEDURE:  Risks and benefits of the catheterization  were explained.  Consent was signed and placed on the chart.  A 5-French  arterial sheath was placed in the right femoral artery using a modified  Seldinger technique.  Standard catheters including JL-4, JR-4 and angled  pigtail were used for the procedure.  All catheter exchanges were made  over a wire.  There were no apparent complications.   Central aortic pressure 129/57 with a mean of 83.  LV pressure is 150/6  with an EDP of 13.  There is no aortic stenosis.   Left main was short, angiographically normal.   LAD was a long vessel wrapping the apex that  gave off 2 small  diagonals.  There was a 30-40% tubular lesion in the midsection.   Left circumflex was a moderate-sized vessel that gave off a small OM-1,  a small OM-2 and a large branching  OM-3, angiographically normal.   Right coronary artery was a moderate-sized vessel that gave off a  moderate-to-large size acute marginal branch, a small PDA and a small  posterolateral that was angiographically normal.   Left ventriculogram done in the RAO position showed an EF of 65% with no  wall motion abnormalities or mitral regurgitation.   ASSESSMENT:  1. Minimal nonobstructive coronary artery disease in the LAD as      described above.  2. Normal LV function.   PLAN/DISCUSSION:  I suspect her chest pain is noncardiac.  Would  continue with aggressive risk factor modification.  She should be  suitable for discharge home in the morning if her groin remains stable.  She did have some bradycardia, but this seems to have been resolved with  holding her atenolol, and I would continue to hold her beta blockers.      Bevelyn Buckles. Bensimhon, MD  Electronically Signed     DRB/MEDQ  D:  01/25/2007  T:  01/25/2007  Job:  [id number]

## 2011-01-12 NOTE — Op Note (Signed)
NAMEJILLIANNE, [name]        ACCOUNT NO.:  [id number]   MEDICAL RECORD NO.:  [id number]          PATIENT TYPE:  AMB   LOCATION:  DAY                           FACILITY:  [facility]   PHYSICIAN:  Kassie Mends, M.D.      DATE OF BIRTH:  [birth year]   DATE OF PROCEDURE:  02/26/2008  DATE OF DISCHARGE:                               OPERATIVE REPORT   REFERRING PHYSICIAN:  Rhae Lerner. Margretta Ditty, M.D.   PRIMARY PHYSICIAN:  Scott A. Gerda Diss, M.D.   PRIMARY GASTROENTEROLOGIST:  Jonathon Bellows, M.D.   PROCEDURE:  Esophagogastroduodenoscopy with cold forceps biopsy.   INDICATION FOR EXAM:  [name] is a 65 year old female who  presented to the emergency department complaining of vomiting of blood.  She had a laparoscopic cholecystectomy 6 weeks ago.   FINDINGS:  1. Normal esophagus without evidence of Barrett's mass, erosion,      ulceration, or stricture.  2. Patchy erythema in the antrum with occasional erosion.  Biopsies      obtained via cold forceps to evaluate for H. pylori gastritis.  3. Normal duodenal bulb, second portion of the duodenum with moderate      bile staining.   DIAGNOSES:  1. No source of [name]'s hematemesis identified.  2. Mild gastritis.   RECOMMENDATIONS:  1. No aspirin, NSAIDs, or anticoagulation for 14 days.  2. Clear liquids for 24 hours then she may resume her previous diet.  3. Will call [name] with results of her biopsies.  4. She is given a handout on gastritis.  5. She has no high-risk lesion in her upper GI tract so she will be      discharged to home.   MEDICATIONS:  1. Demerol 75 mg IV.  2. Versed 4 mg IV.   PROCEDURE TECHNIQUE:  Physical exam was performed.  Informed consent was  obtained from the patient explaining the benefits, risks, and  alternatives to the procedure.  The patient connected to the monitor and  placed in the left lateral position.  Continuous oxygen was provided by  nasal cannula.  IV medicine  administered through an indwelling cannula.  After administration of sedation , the patient's esophagus was  intubated.  The scope was advanced under direct visualization to the  second portion of the duodenum.  Scope was removed slowly by carefully  examining the color, texture, anatomy, and integrity mucosa on the way  out.  The patient was recovered in endoscopy and discharged home in  satisfactory condition.   PATH:  Reactive gastropathy. Continue PPI.      Kassie Mends, M.D.  Electronically Signed     SM/MEDQ  D:  02/26/2008  T:  02/27/2008  Job:  [id number]   cc:   Lorin Picket A. Gerda Diss, MD  Fax: [phone]   R. Roetta Sessions, M.D.  [address]  [place]  [place] [address]

## 2011-01-12 NOTE — Discharge Summary (Signed)
NAMEFABIANA, [name] NO.:  [id number]   MEDICAL RECORD NO.:  [id number]          PATIENT TYPE:  INP   LOCATION:  4735                         FACILITY:  [facility]   PHYSICIAN:  [name], ANP DATE OF BIRTH:  01-Nov-1945   DATE OF ADMISSION:  01/24/2007  DATE OF DISCHARGE:  01/28/2007                               DISCHARGE SUMMARY   PRIMARY CARDIOLOGIST:  Kathleen Friends. Dietrich Pates, MD, [name]   PRIMARY CARE [name]:  [name]. [name], M.D.   DISCHARGE DIAGNOSIS:  Chest pain.   SECONDARY DIAGNOSES:  Syncope, hypertension with hypotension during this  admission, diet-controlled type 2 diabetes mellitus, GERD, hiatal  hernia, hyperlipidemia, status post total abdominal hysterectomy with  bilateral salpingo-oophorectomy, status post left knee surgery, sinus  bradycardia while on beta blocker therapy.   ALLERGIES:  PENICILLIN.   PROCEDURES:  Left heart cardiac catheterization, head CT.   HISTORY OF PRESENT ILLNESS:  [age]-year-old African-American  female with a prior history of normal coronary arteries by cardiac  catheterization in 2005, who presented to the [facility] emergency room  with a one-month history of left-sided chest discomfort that was worse  with upper body movements.  The day prior to ER visit, she was quite  fatigued and had intermittent symptoms throughout the day, eventually  prompting her to contact her primary care [name], who recommended  presentation to the ED.  In the ED, her enzymes were negative and ECG  was without any acute changes.  She was transferred to [name] for  further evaluation.   [name] COURSE:  [name] ruled out for MI and underwent left  heart cardiac catheterization on May 28, revealing normal coronary  arteries, with only a 30-40 percent stenosis in the mid LAD.  She had an  EF of 65%.  Post catheterization on May 29, while standing to be  weighed, she had a syncopal episode, lasting approximately 5-8  seconds.  She was noted by the R.N. to be unresponsive and when she came to, she  complained of diaphoresis.  Vital signs showed a blood pressure of 95/54  and a heart rate of 54.  There were no arrhythmia on telemetry and her  CBG was 160.  A stat head CT was performed and this was negative for any  acute processes.  Our suspicion was that she likely became hypotensive  as a result of orthostasis and she was aggressively hydrated, with  improvement in symptoms.   Due to bradycardia while on low-dose beta blocker therapy, we have  discontinued her beta blocker.  We note that she was taking atenolol at  home previously.  Further, because of her history of hypertension and  diabetes, we tried to initiate ACE inhibitor therapy, however, she  complained of dizziness and lightheadedness with standing.  She was not  found to be orthostatic, however.  Nonetheless, we have discontinued ACE  inhibitor therapy for the time being and I recommended retrial as an  outpatient.  As the patient has not been able to tolerate any form of  blood pressure medication, we discontinued her HCTZ as well, which she  had been on in the  past.  Following hydration last-night, [name]  is feeling better this morning and is ready for discharge.   DISCHARGE LABS:  Hemoglobin 10.6, hematocrit 32.6, WBC 5.6, platelets  231, MCV 78.8.  Sodium 140, potassium 4.0, chloride 109, CO2 of 26, BUN  15, creatinine 0.65, glucose 113.  PT 13.3, INR 1.0, PTT 36, total  bilirubin 0.6, alkaline phosphatase 60, AST 21, ALT 16, albumin 3.3.  Cardiac enzymes negative x2.  Total cholesterol 176, triglycerides 68,  HDL 42, LDL 120, calcium 8.5.  Hemoglobin A1C is pending.  TSH 1.093.  D-  dimer 0.34.   CT of the head was normal.   Chest x-ray showed no acute disease.   DISPOSITION:  Patient is being discharged home today in good condition.   FOLLOWUP PLANS AND APPOINTMENTS:  She was asked to follow up with Dr.  Gerda [name] in  approximately 1-2 weeks.  Our office will contact her for  follow up with Dr. Dietrich [name] in approximately one month.   DISCHARGE MEDICATIONS:  Aspirin 81 mg daily, Lipitor 10 mg daily.   OUTSTANDING LAB STUDIES:  Hemoglobin A1C  is pending.   DURATION DISCHARGE ENCOUNTER:  Forty-five minutes, including physician  time.      [name], ANP     CB/MEDQ  D:  01/28/2007  T:  01/28/2007  Job:  [id number]   cc:   Lorin Picket A. Kathleen Diss, MD

## 2011-01-12 NOTE — Consult Note (Signed)
[name], [name]        ACCOUNT NO.:  [id number]   MEDICAL RECORD NO.:  [id number]          PATIENT TYPE:  AMB   LOCATION:  DAY                           FACILITY:  [facility]   PHYSICIAN:  Kassie Mends, M.D.      DATE OF BIRTH:  03-21-1946   DATE OF CONSULTATION:  DATE OF DISCHARGE:                                 CONSULTATION   REFERRING PHYSICIAN:  Rhae Lerner. Margretta Ditty, M.D.   PRIMARY PHYSICIAN:  Scott A. Gerda Diss, M.D.   PRIMARY GASTROENTEROLOGIST:  Jonathon Bellows, M.D.   REASON FOR CONSULTATION:  Hematemesis.   HISTORY OF PRESENT ILLNESS:  [name] is a 65 year old female who  has chronic abdominal pain.  She had a large hepatic cyst which is seen  and was seen and evaluated at [facility].  She subsequently had a  laparoscopic cholecystectomy 6 weeks ago.  She complained of waking up  this morning with vomiting blood.  She denies any problems swallowing.  She does have nausea.  She has chronic abdominal pain.  She denied any  black tarry stool or bright red blood per rectum.  She denies the use of  aspirin.  She is only taking Percocet for pain.   PAST MEDICAL HISTORY:  1. Hyperlipidemia.  2. Depression.  3. Hiatal hernia.  4. Diverticulosis.   PAST SURGICAL HISTORY:  1. Tubal ligation.  2. Hysterectomy.  3. Knee surgery   ALLERGIES:  PENICILLIN.   MEDICATIONS:  1. Percocet.  2. Lipitor.  3. Colace.  4. Xanax.  5. AcipHex.  6. Phenergan as needed.   FAMILY HISTORY:  She has a family history of breast cancer, aneurysm and  stroke.   SOCIAL HISTORY:  She denies any tobacco or alcohol use.   REVIEW OF SYSTEMS:  Per the HPI; otherwise, all systems negative.   PHYSICAL EXAM:  Afebrile and hemodynamically stable.  GENERAL:  She is in no apparent distress, alert and oriented x4.  HEENT EXAM:  Atraumatic, normocephalic.  Mouth:  No oral lesions.  Posterior pharynx without erythema or exudate.  NECK:  Full range of motion.  No lymphadenopathy.  LUNGS:   Clear to auscultation bilaterally.  CARDIOVASCULAR:  Shows regular rhythm, no murmur, normal S1 and S2.  ABDOMEN:  Bowel sounds are present, soft, nondistended, mild tenderness  to palpation in all four quadrants without rebound or guarding.  EXTREMITIES:  Have no cyanosis or edema.  NEURO:  She has no focal neurologic deficits.   LABORATORY:  White count 5.1, hemoglobin 12.6, platelets 267.  INR 0.9.  Potassium 5, BUN 17, creatinine 0.69, alk phos 89, AST 24, ALT 18,  albumin 3.5, amylase 44, lipase 20.   ASSESSMENT:  [name] is a 65 year old female with chronic  abdominal pain and status post laparoscopic cholecystectomy.  She  reports having hematemesis.  The differential diagnosis includes Mallory-  Weiss tear, peptic ulcer disease, and low likelihood of arteriovenous  malformation or gastrointestinal bleed secondary to bleeding from the  bile duct.  She reports having a liver biopsy at [facility].  The  reports are not available to me on today.   Thank you  for allowing me to see [name] in consultation.  My  recommendations follow.   RECOMMENDATIONS:  1. Will perform EGD on today and assess the risk for re-bleeding and      need for admission.  2. She should continue her pain medicine and her Phenergan as an      outpatient.      Kassie Mends, M.D.  Electronically Signed     SM/MEDQ  D:  02/26/2008  T:  02/26/2008  Job:  [id number]   cc:   Lorin Picket A. Gerda Diss, MD  Fax: [phone]   R. Roetta Sessions, M.D.  [address]  [place]  [place] [address]

## 2011-01-12 NOTE — Letter (Signed)
February 24, 2007    Scott A. Gerda Diss, MD  [address], [address]  [place], [place] [address]   RE:  Kathleen, AVALOS  MRN:  [id number]  /  DOB:  05-Jun-1946   Dear Lorin Picket:   [name] returns to the office to verify that her post-  catheterization has resolved.  There was no charge for this visit.  She  has continued to have some discomfort in the right inguinal region, but  this is markedly improved.  She is not any analgesics for the past 48  hours.  She wishes to return to work, although she would like to start  with a half day schedule.   CURRENT MEDICATIONS:  Include only aspirin and atorvastatin.   EXAMINATION:  Pleasant woman in no acute distress.  The weight is 215  pounds, 2 pounds more than at her last visit.  Blood pressure 125/70,  heart rate 65 and regular, respirations 16.  There is no residual  hematoma.  The femoral arterial pulse is normal.  There is no arterial  bruit.   LAB:  Total cholesterol is 154 with triglycerides of 95 and LDL of 82.  Coagulation studies were normal at the time of her last visit.   IMPRESSION:  [name] is doing generally well.  There should be no  sequelae as a result of her post-catheterization bleed.  I will be happy  to see this nice woman at any time you deem appropriate.    Sincerely,      Gerrit Friends. Dietrich Pates, MD, [facility]  Electronically Signed    RMR/MedQ  DD: 02/24/2007  DT: 02/24/2007  Job #: [id number]

## 2011-01-12 NOTE — Consult Note (Signed)
[name], [name]        ACCOUNT NO.:  [id number]   MEDICAL RECORD NO.:  [id number]          PATIENT TYPE:  AMB   LOCATION:  DAY                           FACILITY:  [facility]   PHYSICIAN:  R. Roetta Sessions, M.D. DATE OF BIRTH:  November 12, 1945   DATE OF CONSULTATION:  09/29/2007  DATE OF DISCHARGE:                                 CONSULTATION   REASON FOR CONSULTATION:  Large hepatic cyst.   PHYSICIAN REQUESTING CONSULTATION:  Scott A. Gerda Diss, M.D.   HISTORY OF PRESENT ILLNESS:  Ms. Scheidegger is a pleasant 65 year old  African-American female who presents today for further evaluation of a  large hepatic cyst.  She is known to have a large hepatic cyst dating  back at least a couple of years, however, because of the size and the  fact that she has been having some abdominal discomfort, she has been  referred for our opinion.  Looking back through her records, back in  1999 this hepatic cysts was noted on an abdominal ultrasound.  At the  time it was 5.7 cm in diameter.  In 2006 this hepatic cyst measured 7.8  x 5.7 cm in the dome of the liver.  It appeared benign.  Recently on an  abdominal ultrasound which was done for abdominal pain the cyst measured  8.8 x 7.8 x 7.2 cm.  Gallbladder was unremarkable.  Common bile duct  measured 2 mm.  She describes having intermittent upper abdominal pain  off and on for several months.  She states she has been on Prevacid and  Mylanta in the past without any results.  She describes the pain as  indigestion or fullness in the epigastrium.  She has difficulty  describing it and states it just hurts.  It occurs with meals and  without meals.  She complains of a decreased appetite.  She has had an  intentional weight loss of 70 pounds over the last 1 year.  She denies  any typical heartburn-type symptoms.  She has had acid reflux in the  past, however.  Denies any dysphagia, odynophagia, melena or  rectal  bleeding.  She has had constipation  recently, has been taking lactulose  with good results.  Denies any dysuria, hematuria.   CURRENT MEDICATIONS:  Os-Cal daily, Darvocet N 100 every 4 hours, Xanax  0.25 mg b.i.d. as needed but rarely takes, Lipitor 10 mg daily, Celexa  20 mg daily, lactulose b.i.d.   ALLERGIES:  PENICILLIN.   PAST MEDICAL HISTORY:  Hypercholesterolemia, depression, large hepatic  cyst as outlined above.  Prior EGD in 1999 revealed a small sliding  hiatal hernia.  Colonoscopy in April 2006 showed a few small diverticula  at the sigmoid colon along with one at the descending colon, small cecal  polyps, two tiny polyps at the sigmoid colon.  The one at the cecum was  focally adenomatous.  She is due for followup colonoscopy in April 2011.  She has had a hysterectomy, knee surgery and has had benign breast  biopsy, bilateral tubal ligation as well.  Negative cardiac cath.  History of osteoarthritis, hypertension.   FAMILY HISTORY:  She had a  sister with breast cancer.  Mother at age 33  died with aneurysm.  Father died of old age at age 19.  A brother died  of stroke  last week.   SOCIAL HISTORY:  She is married and has three children.  She is employed  at [employer].  She has never been a smoker.  No alcohol use.   REVIEW OF SYSTEMS:  See HPI for GI.  CONSTITUTIONAL:  Intentional weight  loss of 70 pounds over 1 year.  CARDIOPULMONARY:  No chest pain or  shortness of breath.   PHYSICAL EXAMINATION:  VITAL SIGNS:  Weight 218, height 5'5,  temperature 98.4, blood pressure 112/80, pulse 60.  GENERAL:  Pleasant obese African-American female in no acute distress.  SKIN:  Warm and dry.  No jaundice.  HEENT:  Sclerae nonicteric.  Oropharyngeal mucosa moist and pink.  No  lesions, erythema or exudate.  No lymphadenopathy, thyromegaly.  CHEST:  Lungs are clear to auscultation.  CARDIAC:  Exam reveals regular rate and rhythm.  Normal S1-S2.  No  murmurs, rubs or gallops.  ABDOMEN:  Positive bowel  sounds.  The abdomen is soft, obese.  She has  mild epigastric right upper quadrant tenderness to deep palpation.  No  organomegaly or masses appreciated.  No rebound tenderness or guarding.  No abdominal bruits or hernias.  LOWER EXTREMITIES:  No edema.   IMPRESSION:  Ms. Mabin is a pleasant 65 year old lady who presents  with intermittent several-month history of epigastric/upper abdominal  discomfort, fullness.  Occurs with and without meals.  Recently  gallbladder was unremarkable on ultrasound.  Denies any typical  heartburn-type symptoms at this time.  I do not feel that all of her  upper GI symptoms are related to the large hepatic cyst.  With this in  mind, we have offered her an upper endoscopy to further evaluate her  symptoms.  Discussed the risks, alternatives and benefits, and the  patient is agreeable to proceed.  She has a very large hepatic cyst  which has been present dating back since 1999, although it has enlarged  with time but more stable over the last couple of years.  We will review  abdominal ultrasound with radiologist and make further recommendations.  If it is felt that if this cyst is symptomatic we could pursue  aspiration of the cyst down in interventional radiology in [place].   PLAN:  1. LFTs.  2. EGD with Dr. Jena Gauss.  3. Trial of Aciphex 20 mg daily, #20.  Samples provided.  4. We will review x-ray with Dr. Doyce Para the first of the week and make      further recommendations.   I would like to thank Dr. Lilyan Punt for allowing [place] to take part in  the care of this patient.      Tana Coast, P.AJonathon Bellows, M.D.  Electronically Signed    LL/MEDQ  D:  09/29/2007  T:  09/29/2007  Job:  [id number]   cc:   Lorin Picket A. Gerda Diss, MD  Fax: [phone]   R. Roetta Sessions, M.D.  [address]  [place]  [place] [address]

## 2011-01-15 NOTE — Discharge Summary (Signed)
[name], [name] NO.:  [id number]   MEDICAL RECORD NO.:  [id number]          PATIENT TYPE:  OIB   LOCATION:  2856                         FACILITY:  [facility]   PHYSICIAN:  Arturo Morton. Riley Kill, M.D. [facility] OF BIRTH:  Nov 24, 1945   DATE OF ADMISSION:  07/29/2004  DATE OF DISCHARGE:  07/29/2004                           DISCHARGE SUMMARY - REFERRING   DISCHARGE DIAGNOSIS:  1.  Chest pain felt to be noncardiac.  2.  Diabetes mellitus on oral agents.  3.  Hypertension, treated.   HOSPITAL COURSE:  [name] is a 65 year old female who was admitted to  [facility] July 28, 2004, with history of chest pain.  She has  a history of hypertension as well as diet controlled diabetes.  Stress  Cardiolite July 17, 2004, revealed poor exercise tolerance, EF 72%,  there was a subtle area of nonreversible reperfusion in the anterior septal  wall, there was questionable rest attenuation. Because the patient was  having chest pain, she was transferred to [facility] for cardiac  catheterization.  She underwent cardiac catheterization on July 29, 2004, and was found to have angiographically normal coronary arteries and is  scheduled for discharge to home.  She is discharged to home in stable  condition on her home medications which include HCTZ 25 mg daily, potassium  10 mEq daily, Aciphex daily, and OS-Cal daily.  Regular Tylenol as needed.  No straining or lifting over 10 pounds for one week.  Remain on a low fat  diet.  Clean the cath site with soap and water, no scrubbing.  Call for  questions or concerns.  Return for an office visit to have a groin check  with Jae Dire, P.A.-C. next week.  Otherwise, she is to call and schedule  a follow up with Dr. Gerda Diss for further evaluation of her chest pain which  is felt to be noncardiac.       LB/MEDQ  D:  07/29/2004  T:  07/29/2004  Job:  [id number]   cc:   Lorin Picket A. Gerda Diss, MD  [address],  [address]  [place]  [place] [address]  Fax: [phone]

## 2011-01-15 NOTE — Procedures (Signed)
[name], [name]        ACCOUNT NO.:  [id number]   MEDICAL RECORD NO.:  [id number]          PATIENT TYPE:  OBV   LOCATION:  IC06                          FACILITY:  [facility]   PHYSICIAN:  Scott A. Gerda Diss, MD    DATE OF BIRTH:  1945-11-25   DATE OF PROCEDURE:  DATE OF DISCHARGE:  07/29/2004                                EKG INTERPRETATION   PROCEDURE:  EKG.   INTERPRETATION:  Sinus bradycardia.  No acute ST segment changes.  Normal  EKG.      SAL/MEDQ  D:  09/23/2004  T:  09/23/2004  Job:  [id number]

## 2011-01-15 NOTE — Procedures (Signed)
[name], [name] NO.:  [id number]   MEDICAL RECORD NO.:  [id number]           PATIENT TYPE:   LOCATION:                                 FACILITY:   PHYSICIAN:  Donna Bernard, M.D.     DATE OF BIRTH:   DATE OF PROCEDURE:  07/28/2004  DATE OF DISCHARGE:  07/29/2004                                EKG INTERPRETATION   TIME OF PROCEDURE:  1700 hours.   PROCEDURE:  EKG.   DESCRIPTION OF PROCEDURE:  EKG reveals sinus bradycardia with no significant  ST-T changes.  Otherwise within normal limits.     Kristine Royal   WSL/MEDQ  D:  08/25/2004  T:  08/25/2004  Job:  [id number]

## 2011-01-15 NOTE — H&P (Signed)
NAMEBRIGID, [name]        ACCOUNT NO.:  [id number]   MEDICAL RECORD NO.:  [id number]          PATIENT TYPE:  INP   LOCATION:  IC06                          FACILITY:  [facility]   PHYSICIAN:  Scott A. Gerda Diss, MD    DATE OF BIRTH:  09-Aug-1946   DATE OF ADMISSION:  07/28/2004  DATE OF DISCHARGE:  LH                                HISTORY & PHYSICAL   CHIEF COMPLAINT:  Chest pressure and discomfort.   HISTORY OF PRESENT ILLNESS:  A 65 year old black female who presents to the  office with chest pressure.  It woke her up this morning from her sleep at  about 5 or 5:30.  She describes it as substernal, radiating into the  shoulder with some intermittent tingling into the left arm.  She feels very  weak like she is going to pass out.  She is slightly nauseated and slightly  shortness of breath, mainly from a standpoint that it does not feel like she  can take a deep breath.  She denies fevers, chills or cough.  She denies  previous chest pain over the past several days, but she did have chest pain  earlier this month, and she had a stress Cardiolite done which overall  looked pretty good, and most likely had some slight changes due to breast  attenuation.  She was set up to see Dr. Dorethea Clan on December 9 when she had  this event occur today.   PAST MEDICAL HISTORY:  1.  Hypertension.  2.  Impaired glucose tolerance.  3.  Osteoarthritis.  4.  Fibrocystic breast disease.   SURGICAL HISTORY:  A hysterectomy for benign reasons, colonoscopy with  diverticulitis in 2000.  Benign breast biopsy in 1998, and a negative  Cardiolite in 1999.   FAMILY HISTORY:  Breast cancer, hypertension, diabetes, heart disease and  cholesterol.   SOCIAL HISTORY:  She does not smoke.  She is married and has three children.   ALLERGIES:  PENICILLIN.   MEDICATIONS:  1.  Hydrochlorothiazide 25 mg daily.  2.  Kcl 10 mEq one daily.  3.  Vicodin q.4hl p.r.n.  4.  Os-Cal daily.   PHYSICAL EXAMINATION:   GENERAL:  No acute distress.  The patient is somewhat  anxious, but alert and oriented, complaining of chest discomfort.  HEENT:  TMs NLT-NO.  NECK:  Supple.  CHEST:  Clear to auscultation.  RNL.  No crackles.  HEART:  Regular.  No gallop, no murmurs.  ABDOMEN:  Soft, no masses, no tenderness.  EXTREMITIES:  No edema.  SKIN:  Warm and dry.  VITAL SIGNS:  Blood pressure 130/86.   The patient was given a full aspirin, 325 mg in the office along with one  sublingual nitroglycerin before being transported by EMS to ICU.   LABORATORY DATA:  Initial cardiac enzymes negative.   EKG:  No severe, acute changes noted.   ASSESSMENT/PLAN:  1.  Chest pain, even with the Cardiolite test, this could be a false      negative.  I really feel she needs to have further identification done      in order to define if  she does have an myocardial infarction going on.  2.  I recommend that we continue with serial enzymes along with using      heparin plus also cardiology consultation to probably arrange for a      catheterization on the patient.     Scot   SAL/MEDQ  D:  07/28/2004  T:  07/28/2004  Job:  [id number]

## 2011-01-15 NOTE — Procedures (Signed)
[name], [name]        ACCOUNT NO.:  [id number]   MEDICAL RECORD NO.:  [id number]          PATIENT TYPE:  OUT   LOCATION:  RAD                           FACILITY:  [facility]   PHYSICIAN:  Scott A. Gerda Diss, MD    DATE OF BIRTH:  1945-09-24   DATE OF PROCEDURE:  DATE OF DISCHARGE:  07/03/2004                                    STRESS TEST   FINDINGS:  1.  Resting EKG, no acute ST-segment changes are noted.  2.  Stress test/ Bruce protocol, Cardiolite.  Indication, chest discomfort.  3.  ST segment response to exercise:  The patient did not have any      concerning ST-segment responses to exercise.  He was overall fine.  4.  Arrhythmia.  The patient did have occasional PVCs but nothing      pathologic.  5.  Resting EKGs in recovery returned quickly but normal.  6.  Heart rate response to exercise:  The patient obtained target heart rate      at approximately 3-1/2 minutes of exercise.  7.  The test was modified to be a little bit slow in stage 2 because of      dyspnea.  8.  Blood pressure response to exercise.  The patient had moderate blood      pressure response to exercise but nothing severely abnormal.   INTERPRETATION:  Normal EKG portion of the stress test except for exercise  tolerance.  We will work on building up exercise routine, but at this point,  __________ hold doing that until the Cardiolite images are back.     Scot   SAL/MEDQ  D:  07/16/2004  T:  07/16/2004  Job:  [id number]

## 2011-01-15 NOTE — Consult Note (Signed)
[name], Kathleen NO.:  [id number]   MEDICAL RECORD NO.:  [id number]          PATIENT TYPE:  INP   LOCATION:  IC06                          FACILITY:  [facility]   PHYSICIAN:  Vida Roller, M.D.   DATE OF BIRTH:  1946-05-04   DATE OF CONSULTATION:  03/27/2004  DATE OF DISCHARGE:                                   CONSULTATION   DATE OF CONSULTATION:  July 28, 2004.   HISTORY OF PRESENT ILLNESS:  [name] is a 65 year old African  American female with no known coronary artery disease, but risk factors of  diabetes, hyperlipidemia and hypertension who presents through her primary-  care providers office today complaining of discomfort in her chest, left  anterior chest heaviness radiating down her left arm into her back.  It  started when she was lying in bed.  It is associated with shortness of  breath, nausea and diabetes.  She denies any previous episodes of chest  discomfort.  She was seen in their office back two weeks ago complaining of  dyspnea on exertion.  She underwent a stress Cardiolite on November 18 of  this year at [facility] which showed evidence of non-reversible  decreased perfusion in the anteroseptal wall, question of ischemia versus  breast attenuation.  Ejection fraction was normal at 72%.  She has mild  ventilatory defect on her PFT's done recently, likely secondary to her  obesity.  She has had a hysterectomy, a bilateral tubal ligation, breast  biopsy which was benign, and left knee arthroplasty.   SOCIAL HISTORY:  She lives in [place] with her husband.  She is a  uniform Merchandiser, retail.  She is married.  She has three children.  She does not  smoke, drink or use drugs.  Her mother died at age 45 of coronary artery  disease.  Her father died at age 65 of hypertension and kidney disease.  She  is the youngest of 14 siblings.  Two of her brothers have coronary disease,  one sister has had a myocardial infarction.   REVIEW  OF SYSTEMS:  Her review of systems is generally negative except for  that reviewed in the history of present illness.   MEDICATIONS PRIOR TO ADMISSION:  1.  Hydrochlorothiazide 25 mg once a day.  2.  Potassium chloride, 10 mEq a day.  3.  Aciphex.  4.  Os-Cal.   MEDICATIONS IN HOSPITAL:  1.  Aspirin.  2.  Heparin.  3.  Lopressor p.o. 25 mg b.i.d.  4.  Protonix 40 mg a day.  5.  Nitroglycerin drip.   Her discomfort in her chest has been waxing and waning over the course of  the last few weeks.   PHYSICAL EXAMINATION:  GENERAL:  She is an obese, black female, in no  apparent distress except for the waxing and waning discomfort in her chest.  VITAL SIGNS:  Pulse 56, respirations 18, blood pressure 145/52.  She is  saturating 100% on two liters.  HEENT:  Examination of the head, ears, eyes,  nose and throat is unremarkable.  NECK:  Supple.  There is no jugular venous distension  or carotid bruits.  CHEST:  Clear to auscultation.  CARDIAC:  Regular.  She has a 1/6 systolic murmur.  ABDOMEN:  Obese, soft, nontender, normoactive bowel sounds.  EXTREMITIES:  Lower extremities are without significant clubbing, cyanosis  or edema.  Pulses are 1+ throughout.  NEUROLOGIC:  Nonfocal.   STUDIES:  Chest x-ray is pending at the time of this dictation.   Her electrocardiogram shows sinus bradycardia at a rate of 51 with normal  intervals and a normal axis.  She has inverted T waves in the inferior leads  which are, from what we can tell, old from her evaluation from her  Cardiolite exam.   LABORATORY DATA:  White blood cell count 7.0, H&H of 13 and 38, platelets  count 218,000.  Sodium 139, potassium 3.9, chloride 103, bicarbonate 30, BUN  18, creatinine 0.9, blood sugar is 99.  Liver function studies within normal  limits.  One set of cardiac enzymes is not consistent with acute myocardial  infarction.   ASSESSMENT/PLAN:  This is a woman with discomfort in her chest which is not  classic  for coronary disease, but a question of a perfusion defect on  Cardiolite exam with a normal ejection fraction.  He cardiac enzymes and EKG  are not particularly concerning.  However, her primary feels very strongly  that she needs a heart catheterization, and the patient also feels this  would be the appropriate way to risk stratify her on conversation.  Her  hypertension is reasonably well controlled on the nitroglycerin.  Her  diabetes needs to be further addressed, as well as her lipid status.   So, at this point, at the request of both her primary and the patient, we  will facilitate transfer to [facility] for further evaluation including  heart catheterization.  We will continue to treat her with aspirin, heparin  and nitroglycerin, and we will treat her discomfort with a little bit of  morphine sulfate to see if we can control the waxing and waning  characteristics of her pain.   Her repeat electrocardiogram is essentially unchanged from her admission  echocardiogram.  At this point, I do not feel that she has an ongoing,  dangerous acute coronary syndrome.     Trey Paula   JH/MEDQ  D:  07/28/2004  T:  07/28/2004  Job:  [id number]

## 2011-01-15 NOTE — Op Note (Signed)
NAMEAPOLLONIA, AMINI        ACCOUNT NO.:  [id number]   MEDICAL RECORD NO.:  [id number]          PATIENT TYPE:  AMB   LOCATION:  DAY                           FACILITY:  [facility]   PHYSICIAN:  Lionel December, M.D.    DATE OF BIRTH:  06/21/1946   DATE OF PROCEDURE:  12/04/2004  DATE OF DISCHARGE:                                 OPERATIVE REPORT   PROCEDURE:  Colonoscopy.   INDICATION:  [name] is a 65 year old Afro-American female who is here for  screening colonoscopy. Family history is negative for colorectal carcinoma.  Procedure risks were reviewed with the patient and informed consent was  obtained.   PREMEDICATION:  Demerol 50 mg IV, Versed 4 mg IV.   FINDINGS:  Procedure performed in endoscopy suite. The patient's vital signs  and O2 saturations were monitored during the procedure and remained stable.  The patient was placed in left lateral position and rectal examination  performed. No abnormality noted on external or digital exam. Olympus  videoscope was placed in the rectum and advanced under vision into sigmoid  colon. Preparation was satisfactory. She had few scattered diverticula at  sigmoid colon along with one at ascending colon. Scope was advanced to cecum  which was identified by appendiceal orifice and ileocecal valve. Pictures  taken for the record. There was a small polyp above appendiceal orifice  which was ablated via cold biopsy. There two tiny polyps located side-by-  side at sigmoid colon which were ablated via cold biopsy and submitted one  container. Mucosa rest of the colon was normal. Rectal mucosa similarly was  normal. Scope was retroflexed to examine anorectal junction which was  unremarkable. Endoscope was straightened and withdrawn. The patient  tolerated the procedure well.   FINAL DIAGNOSIS:  1.  Few small diverticula at sigmoid colon along with one at ascending      colon.  2.  Small cecal polyp ablated via cold biopsy.  3.  Two tiny  polyps at sigmoid colon that were also ablated via cold biopsy      and submitted in one container.   RECOMMENDATIONS:  1.  Standard instructions given.  2.  High-fiber diet. Citrucel or equivalent one tablespoonful daily if she      is not on high-fiber diet.  3.  I will contact the patient with biopsy results and further      recommendations.      NR/MEDQ  D:  12/04/2004  T:  12/04/2004  Job:  [id number]   cc:   Lorin Picket A. Gerda Diss, MD  [address], [address]  [place]  [place] [address]  Fax: [phone]

## 2011-01-15 NOTE — Cardiovascular Report (Signed)
NAMEKANDEE, ESCALANTE NO.:  [id number]   MEDICAL RECORD NO.:  [id number]          PATIENT TYPE:  OIB   LOCATION:  2856                         FACILITY:  [facility]   PHYSICIAN:  Charlton Haws, M.D.     DATE OF BIRTH:  [birth year]   DATE OF PROCEDURE:  DATE OF DISCHARGE:                              CARDIAC CATHETERIZATION   INDICATIONS:  Chest pain.   Cardiac catheterization is done from the right femoral artery.   Left main coronary artery was normal.   The left anterior descending artery was normal.   Circumflex coronary artery is normal.   Right coronary artery was somewhat small but dominant.  It was normal.   Right ventriculography:  The right ventriculography showed hyperdynamic LV  function, ejection fraction of 65%.  There was no gradient across the aortic  valve and no MR.  The aortic pressure was 129/66, LV pressure is 130/90.   IMPRESSION:  The patient has no evidence of significant coronary artery  disease on RAO ventriculogram.  The ascending aorta and descending thoracic  aorta looked fine with no evidence of dissection.  Her chest pain would  appear to be noncardiac in etiology.  She will be discharged later today to  follow up with her primary care M.D.   The patient tolerated the procedure well.       PN/MEDQ  D:  07/29/2004  T:  07/29/2004  Job:  [id number]   cc:   Vida Roller, M.D.  Fax: [phone]   Scott A. Gerda Diss, MD  [address], [address]  [place]  [place] [address]  Fax: [phone]

## 2011-01-15 NOTE — Procedures (Signed)
NAME:  [name], [name]                  ACCOUNT NO.:  [id number]   MEDICAL RECORD NO.:  [id number]                   PATIENT TYPE:  OUT   LOCATION:  RAD                                  FACILITY:  [facility]   PHYSICIAN:  Edward L. Juanetta Gosling, M.D.             DATE OF BIRTH:  June 05, 1946   DATE OF PROCEDURE:  09/09/2003  DATE OF DISCHARGE:                              PULMONARY FUNCTION TEST   Spirometry shows a mild ventilatory defect without evidence of airflow  obstruction.      ___________________________________________                                            Oneal Deputy Juanetta Gosling, M.D.   ELH/MEDQ  D:  09/09/2003  T:  09/10/2003  Job:  [id number]   cc:   Lorin Picket A. Gerda Diss, M.D.  [address], [address]  [place]  [place] [address]  Fax: [phone]

## 2011-01-15 NOTE — Procedures (Signed)
NAMECLARIECE, [name] NO.:  [id number]   MEDICAL RECORD NO.:  [id number]          PATIENT TYPE:  OBV   LOCATION:  IC06                          FACILITY:  [facility]   PHYSICIAN:  Donna Bernard, M.D.DATE OF BIRTH:  [birth year]   DATE OF PROCEDURE:  07/28/2004  DATE OF DISCHARGE:                                EKG INTERPRETATION   PROCEDURE:  EKG.   DESCRIPTION OF PROCEDURE:  EKG reveals sinus bradycardia with no significant  ST-T changes.   TIME OF PROCEDURE:  1400 hours.     Kristine Royal   WSL/MEDQ  D:  08/25/2004  T:  08/25/2004  Job:  [id number]

## 2011-05-09 ENCOUNTER — Emergency Department (HOSPITAL_COMMUNITY): Payer: Self-pay

## 2011-05-09 ENCOUNTER — Other Ambulatory Visit: Payer: Self-pay

## 2011-05-09 ENCOUNTER — Encounter: Payer: Self-pay | Admitting: Emergency Medicine

## 2011-05-09 ENCOUNTER — Inpatient Hospital Stay (HOSPITAL_COMMUNITY)
Admission: EM | Admit: 2011-05-09 | Discharge: 2011-05-11 | DRG: 313 | Disposition: A | Discharge status: Home / Self Care | Payer: Self-pay | Attending: Internal Medicine | Admitting: Internal Medicine

## 2011-05-09 DIAGNOSIS — R001 Bradycardia, unspecified: Secondary | ICD-10-CM | POA: Diagnosis present

## 2011-05-09 DIAGNOSIS — K279 Peptic ulcer, site unspecified, unspecified as acute or chronic, without hemorrhage or perforation: Secondary | ICD-10-CM | POA: Diagnosis present

## 2011-05-09 DIAGNOSIS — K449 Diaphragmatic hernia without obstruction or gangrene: Secondary | ICD-10-CM

## 2011-05-09 DIAGNOSIS — K5900 Constipation, unspecified: Secondary | ICD-10-CM | POA: Diagnosis present

## 2011-05-09 DIAGNOSIS — R110 Nausea: Secondary | ICD-10-CM

## 2011-05-09 DIAGNOSIS — R0789 Other chest pain: Principal | ICD-10-CM | POA: Diagnosis present

## 2011-05-09 DIAGNOSIS — K920 Hematemesis: Secondary | ICD-10-CM

## 2011-05-09 DIAGNOSIS — R42 Dizziness and giddiness: Secondary | ICD-10-CM | POA: Diagnosis present

## 2011-05-09 DIAGNOSIS — T448X5A Adverse effect of centrally-acting and adrenergic-neuron-blocking agents, initial encounter: Secondary | ICD-10-CM | POA: Diagnosis present

## 2011-05-09 DIAGNOSIS — I10 Essential (primary) hypertension: Secondary | ICD-10-CM | POA: Diagnosis present

## 2011-05-09 DIAGNOSIS — K7689 Other specified diseases of liver: Secondary | ICD-10-CM

## 2011-05-09 DIAGNOSIS — E7800 Pure hypercholesterolemia, unspecified: Secondary | ICD-10-CM

## 2011-05-09 DIAGNOSIS — R079 Chest pain, unspecified: Secondary | ICD-10-CM | POA: Diagnosis present

## 2011-05-09 DIAGNOSIS — E669 Obesity, unspecified: Secondary | ICD-10-CM | POA: Diagnosis present

## 2011-05-09 DIAGNOSIS — E876 Hypokalemia: Secondary | ICD-10-CM | POA: Diagnosis present

## 2011-05-09 DIAGNOSIS — F329 Major depressive disorder, single episode, unspecified: Secondary | ICD-10-CM

## 2011-05-09 DIAGNOSIS — M1990 Unspecified osteoarthritis, unspecified site: Secondary | ICD-10-CM

## 2011-05-09 DIAGNOSIS — R109 Unspecified abdominal pain: Secondary | ICD-10-CM

## 2011-05-09 HISTORY — DX: Reserved for concepts with insufficient information to code with codable children: IMO0002

## 2011-05-09 HISTORY — DX: Diverticulitis of intestine, part unspecified, without perforation or abscess without bleeding: K57.92

## 2011-05-09 HISTORY — DX: Essential (primary) hypertension: I10

## 2011-05-09 LAB — DIFFERENTIAL
Basophils Absolute: 0 10*3/uL (ref 0.0–0.1)
Basophils Relative: 0 % (ref 0–1)
Eosinophils Absolute: 0.3 10*3/uL (ref 0.0–0.7)
Monocytes Relative: 10 % (ref 3–12)
Neutro Abs: 3.2 10*3/uL (ref 1.7–7.7)
Neutrophils Relative %: 30 % — ABNORMAL LOW (ref 43–77)

## 2011-05-09 LAB — CARDIAC PANEL(CRET KIN+CKTOT+MB+TROPI)
Total CK: 144 U/L (ref 7–177)
Troponin I: <0.3 ng/mL (ref <0.30)

## 2011-05-09 LAB — BASIC METABOLIC PANEL
BUN: 24 mg/dL — ABNORMAL HIGH (ref 6–23)
Chloride: 98 mEq/L (ref 96–112)
GFR calc Af Amer: >60 mL/min (ref >60)
GFR calc non Af Amer: 52 mL/min — ABNORMAL LOW (ref >60)
Potassium: 3 mEq/L — ABNORMAL LOW (ref 3.5–5.1)

## 2011-05-09 LAB — CBC
MCH: 25.2 pg — ABNORMAL LOW (ref 26.0–34.0)
MCHC: 32.4 g/dL (ref 30.0–36.0)
Platelets: 318 10*3/uL (ref 150–400)
RDW: 16.3 % — ABNORMAL HIGH (ref 11.5–15.5)

## 2011-05-09 MED ORDER — ASPIRIN 81 MG PO CHEW
324.0000 mg | CHEWABLE_TABLET | Freq: Once | ORAL | Status: AC
Start: 2011-05-09 — End: 2011-05-09
  Administered 2011-05-09: 324 mg via ORAL

## 2011-05-09 MED ORDER — ONDANSETRON HCL 4 MG/2ML IJ SOLN
4.0000 mg | Freq: Once | INTRAMUSCULAR | Status: AC
  Administered 2011-05-09: 4 mg via INTRAVENOUS
  Filled 2011-05-09: qty 2

## 2011-05-09 MED ORDER — MORPHINE SULFATE 4 MG/ML IJ SOLN
4.0000 mg | Freq: Once | INTRAMUSCULAR | Status: AC
  Administered 2011-05-09: 4 mg via INTRAVENOUS
  Filled 2011-05-09: qty 1

## 2011-05-09 MED ORDER — ASPIRIN 81 MG PO CHEW
CHEWABLE_TABLET | ORAL | Status: AC
  Administered 2011-05-09: 324 mg via ORAL
  Filled 2011-05-09: qty 4

## 2011-05-09 NOTE — ED Notes (Signed)
Pt complaining of chest tightness, sob, and high blood pressure since this afternoon

## 2011-05-09 NOTE — ED Notes (Signed)
Placed on 2l o2 per n/c 

## 2011-05-10 ENCOUNTER — Inpatient Hospital Stay (HOSPITAL_COMMUNITY): Payer: Self-pay

## 2011-05-10 ENCOUNTER — Encounter (HOSPITAL_COMMUNITY): Payer: Self-pay | Admitting: Cardiology

## 2011-05-10 ENCOUNTER — Encounter (HOSPITAL_COMMUNITY): Payer: Self-pay

## 2011-05-10 DIAGNOSIS — R072 Precordial pain: Secondary | ICD-10-CM

## 2011-05-10 DIAGNOSIS — R079 Chest pain, unspecified: Secondary | ICD-10-CM

## 2011-05-10 DIAGNOSIS — R42 Dizziness and giddiness: Secondary | ICD-10-CM | POA: Diagnosis present

## 2011-05-10 DIAGNOSIS — E876 Hypokalemia: Secondary | ICD-10-CM | POA: Diagnosis present

## 2011-05-10 DIAGNOSIS — R001 Bradycardia, unspecified: Secondary | ICD-10-CM | POA: Diagnosis present

## 2011-05-10 LAB — URINALYSIS, ROUTINE W REFLEX MICROSCOPIC
Bilirubin Urine: NEGATIVE
Glucose, UA: NEGATIVE mg/dL
Hgb urine dipstick: NEGATIVE
Specific Gravity, Urine: >1.03 — ABNORMAL HIGH (ref 1.005–1.030)
Urobilinogen, UA: 0.2 mg/dL (ref 0.0–1.0)
pH: 5.5 (ref 5.0–8.0)

## 2011-05-10 LAB — COMPREHENSIVE METABOLIC PANEL
Albumin: 3.3 g/dL — ABNORMAL LOW (ref 3.5–5.2)
BUN: 23 mg/dL (ref 6–23)
Calcium: 9.1 mg/dL (ref 8.4–10.5)
GFR calc Af Amer: >60 mL/min (ref >60)
Glucose, Bld: 105 mg/dL — ABNORMAL HIGH (ref 70–99)
Sodium: 136 mEq/L (ref 135–145)
Total Protein: 6.8 g/dL (ref 6.0–8.3)

## 2011-05-10 LAB — CBC
HCT: 39.6 % (ref 36.0–46.0)
Hemoglobin: 12.7 g/dL (ref 12.0–15.0)
MCH: 25 pg — ABNORMAL LOW (ref 26.0–34.0)
MCHC: 32.1 g/dL (ref 30.0–36.0)
RDW: 16.2 % — ABNORMAL HIGH (ref 11.5–15.5)

## 2011-05-10 LAB — CARDIAC PANEL(CRET KIN+CKTOT+MB+TROPI)
CK, MB: 3.2 ng/mL (ref 0.3–4.0)
Troponin I: <0.3 ng/mL (ref <0.30)

## 2011-05-10 LAB — MAGNESIUM: Magnesium: 2 mg/dL (ref 1.5–2.5)

## 2011-05-10 MED ORDER — FLEET ENEMA 7-19 GM/118ML RE ENEM: 1.0000 | ENEMA | RECTAL | Status: DC | PRN

## 2011-05-10 MED ORDER — TRAZODONE HCL 50 MG PO TABS: 25.0000 mg | ORAL_TABLET | Freq: Every evening | ORAL | Status: DC | PRN

## 2011-05-10 MED ORDER — SODIUM CHLORIDE 0.9 % IJ SOLN
INTRAMUSCULAR | Status: AC
  Administered 2011-05-10: 10 mL
  Filled 2011-05-10: qty 10

## 2011-05-10 MED ORDER — ACETAMINOPHEN 650 MG RE SUPP: 650.0000 mg | Freq: Four times a day (QID) | RECTAL | Status: DC | PRN

## 2011-05-10 MED ORDER — PANTOPRAZOLE SODIUM 40 MG PO TBEC
40.0000 mg | DELAYED_RELEASE_TABLET | Freq: Every day | ORAL | Status: DC
  Administered 2011-05-10: 40 mg via ORAL
  Filled 2011-05-10: qty 1

## 2011-05-10 MED ORDER — ENOXAPARIN SODIUM 40 MG/0.4ML ~~LOC~~ SOLN
40.0000 mg | Freq: Every day | SUBCUTANEOUS | Status: DC
  Administered 2011-05-10 – 2011-05-11 (×2): 40 mg via SUBCUTANEOUS
  Filled 2011-05-10 (×2): qty 0.4

## 2011-05-10 MED ORDER — BIOTENE DRY MOUTH MT LIQD
Freq: Two times a day (BID) | OROMUCOSAL | Status: DC
  Administered 2011-05-10 (×2): via OROMUCOSAL

## 2011-05-10 MED ORDER — POTASSIUM CHLORIDE IN NACL 40-0.9 MEQ/L-% IV SOLN
INTRAVENOUS | Status: AC
  Filled 2011-05-10: qty 1000

## 2011-05-10 MED ORDER — POLYETHYLENE GLYCOL 3350 17 G PO PACK: 17.0000 g | PACK | Freq: Every day | ORAL | Status: DC | PRN

## 2011-05-10 MED ORDER — HYDROCHLOROTHIAZIDE 12.5 MG PO CAPS
25.0000 mg | ORAL_CAPSULE | Freq: Every day | ORAL | Status: DC
  Administered 2011-05-10 – 2011-05-11 (×2): 25 mg via ORAL
  Filled 2011-05-10 (×2): qty 2

## 2011-05-10 MED ORDER — PANTOPRAZOLE SODIUM 40 MG IV SOLR
40.0000 mg | Freq: Once | INTRAVENOUS | Status: AC
  Administered 2011-05-10: 40 mg via INTRAVENOUS
  Filled 2011-05-10: qty 40

## 2011-05-10 MED ORDER — ACETAMINOPHEN 325 MG PO TABS
650.0000 mg | ORAL_TABLET | Freq: Four times a day (QID) | ORAL | Status: DC | PRN
Start: 2011-05-10 — End: 2011-05-11
  Administered 2011-05-10 – 2011-05-11 (×2): 650 mg via ORAL
  Filled 2011-05-10 (×2): qty 2

## 2011-05-10 MED ORDER — DICYCLOMINE HCL 20 MG PO TABS
20.0000 mg | ORAL_TABLET | Freq: Three times a day (TID) | ORAL | Status: DC
  Filled 2011-05-10: qty 1

## 2011-05-10 MED ORDER — POTASSIUM CHLORIDE CRYS ER 20 MEQ PO TBCR
40.0000 meq | EXTENDED_RELEASE_TABLET | ORAL | Status: AC
  Administered 2011-05-10 (×2): 40 meq via ORAL
  Filled 2011-05-10 (×2): qty 2

## 2011-05-10 MED ORDER — SODIUM CHLORIDE 0.9 % IV SOLN
INTRAVENOUS | Status: DC
  Administered 2011-05-10: 03:00:00 via INTRAVENOUS
  Filled 2011-05-10 (×3): qty 1000

## 2011-05-10 MED ORDER — MORPHINE SULFATE 2 MG/ML IJ SOLN
1.0000 mg | INTRAMUSCULAR | Status: DC | PRN
  Administered 2011-05-10: 1 mg via INTRAVENOUS
  Filled 2011-05-10: qty 1

## 2011-05-10 MED ORDER — BISACODYL 10 MG RE SUPP: 10.0000 mg | RECTAL | Status: DC | PRN

## 2011-05-10 MED ORDER — ONDANSETRON HCL 4 MG PO TABS: 4.0000 mg | ORAL_TABLET | Freq: Four times a day (QID) | ORAL | Status: DC | PRN

## 2011-05-10 MED ORDER — ASPIRIN EC 81 MG PO TBEC
81.0000 mg | DELAYED_RELEASE_TABLET | Freq: Every day | ORAL | Status: DC
  Administered 2011-05-10 – 2011-05-11 (×2): 81 mg via ORAL
  Filled 2011-05-10 (×2): qty 1

## 2011-05-10 MED ORDER — DICYCLOMINE HCL 10 MG PO CAPS
20.0000 mg | ORAL_CAPSULE | Freq: Three times a day (TID) | ORAL | Status: DC
  Administered 2011-05-10 (×2): 20 mg via ORAL
  Filled 2011-05-10 (×2): qty 2

## 2011-05-10 MED ORDER — POTASSIUM CHLORIDE 10 MEQ/100ML IV SOLN
10.0000 meq | Freq: Once | INTRAVENOUS | Status: AC
  Administered 2011-05-10: 10 meq via INTRAVENOUS
  Filled 2011-05-10: qty 100

## 2011-05-10 MED ORDER — LISINOPRIL 10 MG PO TABS
10.0000 mg | ORAL_TABLET | Freq: Every day | ORAL | Status: DC
  Administered 2011-05-11: 10 mg via ORAL
  Filled 2011-05-10: qty 1

## 2011-05-10 MED ORDER — ATENOLOL 25 MG PO TABS: 25.0000 mg | ORAL_TABLET | Freq: Every day | ORAL | Status: DC

## 2011-05-10 MED ORDER — ONDANSETRON HCL 4 MG/2ML IJ SOLN: 4.0000 mg | Freq: Four times a day (QID) | INTRAMUSCULAR | Status: DC | PRN

## 2011-05-10 NOTE — H&P (Signed)
PCP:   Lilyan Punt, MD   Chief Complaint:  Chest pressure and dizziness,  Lunch time today  HPI: 65yo AAF, with h/o HTN, DM controlled on diet, and obesity, has been having headache past several days; visited PCP and was started on Atenolol 50 mg daily for uncontrolled HTN. While eating out with her husband today patient began to feel very ill and dizzy was having chest pressure radiating to the left side of her chest she described as an 8/10 associated with dizziness and sweating. Patient was transported to the emergency room by her husband, she did not receive nitroglycerin because of her headache, but did receive morphine and aspirin which relieved the pain. She continues to feel weak and dizzy, aggravated by sitting up.  She denies fever cough or cold she denies vomiting or diarrhea she does tend to have acid reflux for which she takes over-the-counter omeprazole.  She had a cardiac catheterization 4 years ago and was found to have a negligible coronary artery disease.  Review of Systems:  The patient denies anorexia, fever, weight loss,, vision loss, decreased hearing, hoarseness,  syncope, dyspnea on exertion, peripheral edema, balance deficits, hemoptysis, abdominal pain, melena, hematochezia, severe indigestion/heartburn, hematuria, incontinence, genital sores, muscle weakness, suspicious skin lesions, transient blindness, difficulty walking, depression, unusual weight change, abnormal bleeding, enlarged lymph nodes, angioedema, and breast masses.  Past Medical History: Past Medical History  Diagnosis Date  . Hypertension   . Diverticulitis   . Cyst on liver  . Diabetes mellitus    Past Surgical History  Procedure Date  . Back surgery   . Cholecystectomy   . Knee arthrocentesis left  . Breast surgery right    cyst removed  . Abdominal hysterectomy   . Tubal ligation     Medications: Prior to Admission medications   Medication Sig Start Date End Date Taking? Authorizing  Provider  atenolol (TENORMIN) 50 MG tablet Take 25 mg by mouth 2 (two) times daily.     Yes Historical Provider, MD  dicyclomine (BENTYL) 20 MG tablet Take 20 mg by mouth 3 (three) times daily.     Yes Historical Provider, MD  PROBIOTIC CAPS Take 1 capsule by mouth daily.     Yes Historical Provider, MD  triamterene-hydrochlorothiazide (MAXZIDE) 75-50 MG per tablet Take 1 tablet by mouth daily. Patient takes 1/2 tablet daily    Yes Historical Provider, MD    Allergies:   Allergies  Allergen Reactions  . Penicillins     REACTION: unknown reaction    Social History:  reports that she has never smoked. She has never used smokeless tobacco. She reports that she does not drink alcohol or use illicit drugs.  Family History: History reviewed. No pertinent family history.  Physical Exam: Filed Vitals:   05/10/11 0315 05/10/11 0320 05/10/11 0325 05/10/11 0531  BP: 117/67 149/77 157/78 155/68  Pulse: 43 45 48 44  Temp: 97.7 F (36.5 C)   97.5 F (36.4 C)  TempSrc: Oral   Oral  Resp: 20     Height:      Weight:      SpO2: 94%   100%   General appearance: alert, cooperative, fatigued and morbidly obese Head: Normocephalic, without obvious abnormality, atraumatic Eyes: conjunctivae/corneas clear. PERRL, Throat: lips, mucosa, and tongue normal; teeth and gums normal Neck: Thick neck; no adenopathy, no carotid bruit, no JVD, supple, symmetrical, trachea midline and thyroid not enlarged, symmetric, no tenderness/mass/nodules Back: symmetric, no curvature. ROM normal. No CVA tenderness. Resp: clear  to auscultation bilaterally Chest wall: no tenderness Cardio: regular rate and rhythm, S1, S2 normal, no murmur, click, rub or gallop GI: obese, soft, non-tender; bowel sounds normal; no masses,  no organomegaly Extremities: extremities normal, atraumatic, no cyanosis or edema Pulses:symetrical Skin: no rash, nor ulcers. Neurologic:   Labs on Admission:   [facility] 05/09/11 2144  NA 136    K 3.0*  CL 98  CO2 30  GLUCOSE 109*  BUN 24*  CREATININE 1.06  CALCIUM 9.8  MG --  PHOS --   No results found for this basename: AST:2,ALT:2,ALKPHOS:2,BILITOT:2,PROT:2,ALBUMIN:2 in the last 72 hours No results found for this basename: LIPASE:2,AMYLASE:2 in the last 72 hours  Basename 05/09/11 2144  WBC 10.9*  NEUTROABS 3.2  HGB 13.2  HCT 40.8  MCV 78.0  PLT 318    Basename 05/09/11 2144  CKTOTAL 144  CKMB 3.8  CKMBINDEX --  TROPONINI <0.30     Radiological Exams on Admission: Dg Chest Portable 1 View  05/09/2011  *RADIOLOGY REPORT*  Clinical Data: Chest pain  PORTABLE CHEST - 1 VIEW  Comparison: 01/24/2007  Findings: Limited by portable technique and patient body habitus. Hypoaeration results in interstitial crowding.  No definite focal consolidation.  Cardiomediastinal contours are unchanged, within normal limits.  Multilevel degenerative changes.  No definite acute osseous abnormality though evaluation is suboptimal due to technical limitations described above.  IMPRESSION: No definite acute process identified.  Original Report Authenticated By: Waneta Martins, M.D.    Assessment/Plan Present on Admission:  .Chest pain, unspecified .Hypokalemia .Bradycardia .Giddiness .HYPERCHOLESTEROLEMIA .DEPRESSION .PEPTIC ULCER DISEASE .HYPERTENSION .CONSTIPATION  Will bring this lady in for a chest pain workup including serial cardiac enzymes and will consider cardiology evaluation in the morning. Because she is somewhat hypokalemic and bradycardic, we'll replete her potassium but reduce the dose of her diuretic and will also reduce the dose of her atenolol for the time being and monitor her blood pressure in hospital.  If her headache persists may consider further cerebral evaluation. Other plans as per orders.  Juanmanuel Marohl 05/10/2011, 6:07 AM

## 2011-05-10 NOTE — Progress Notes (Signed)
*  PRELIMINARY RESULTS* Echocardiogram Echocardiogram Stress Test has been performed.  [name] 05/10/2011, 12:52 PM

## 2011-05-10 NOTE — ED Provider Notes (Signed)
Medical screening examination/treatment/procedure(s) were conducted as a shared visit with non-physician practitioner(s) and myself.  I personally evaluated the patient during the encounter....chest pain and sob c risk factors.  Heart, lung exam normal.  admit  Donnetta Hutching, MD 05/10/11 2342

## 2011-05-10 NOTE — ED Provider Notes (Signed)
History     CSN: [id number] Arrival date & time: 05/09/2011  9:22 PM  Chief Complaint  Patient presents with  . Chest Pain  . Shortness of Breath   Patient is a 65 y.o. female presenting with chest pain and shortness of breath. The history is provided by the patient.  Chest Pain The chest pain began 6 - 12 hours ago. Chest pain occurs constantly. The chest pain is unchanged. Associated with: She also describes increased weakness and lightheadedness,  especially since she was started on atenolol this week. At its most intense, the pain is at 8/10. The pain is currently at 8/10. The quality of the pain is described as pressure-like (She describes a pressure like sensation in her left chest,  but also describes a sharp headache which started this afternoon around 1 pm.). The pain does not radiate. Exacerbated by: nothing. Primary symptoms include shortness of breath. Pertinent negatives for primary symptoms include no fever, no wheezing, no palpitations, no abdominal pain, no nausea and no dizziness.  Associated symptoms include weakness.  Pertinent negatives for associated symptoms include no diaphoresis, no lower extremity edema, no near-syncope, no numbness and no orthopnea. She tried nothing for the symptoms.  Her past medical history is significant for hyperlipidemia and hypertension.  Procedure history is positive for cardiac catheterization. Procedure history comments: she had a cardiac cath in 5/08 which showed minimal disease.[name]    Shortness of Breath  Associated symptoms include chest pain and shortness of breath. Pertinent negatives include no orthopnea, no fever, no sore throat and no wheezing.    Past Medical History  Diagnosis Date  . Hypertension   . Diverticulitis     History reviewed. No pertinent past surgical history.  History reviewed. No pertinent family history.  History  Substance Use Topics  . Smoking status: Never Smoker   . Smokeless tobacco: Not on file  .  Alcohol Use: No    OB History    Grav Para Term Preterm Abortions TAB SAB Ect Mult Living                  Review of Systems  Constitutional: Positive for appetite change. Negative for fever and diaphoresis.  HENT: Negative for congestion, sore throat and neck pain.   Eyes: Negative.   Respiratory: Positive for chest tightness and shortness of breath. Negative for wheezing.   Cardiovascular: Positive for chest pain. Negative for palpitations, orthopnea, leg swelling and near-syncope.  Gastrointestinal: Negative for nausea and abdominal pain.  Genitourinary: Negative.   Musculoskeletal: Negative for joint swelling and arthralgias.  Skin: Negative.  Negative for rash and wound.  Neurological: Positive for weakness. Negative for dizziness, light-headedness, numbness and headaches.  Hematological: Negative.   Psychiatric/Behavioral: Negative.     Physical Exam  BP 140/61  Temp(Src) 98.3 F (36.8 C) (Oral)  Resp 16  Ht 5\' 5"  (1.651 m)  Wt 220 lb (99.791 kg)  BMI 36.61 kg/m2  SpO2 99%  Physical Exam  Nursing note and vitals reviewed. Constitutional: She is oriented to person, place, and time. She appears well-developed and well-nourished.  HENT:  Head: Normocephalic and atraumatic.  Eyes: Conjunctivae are normal.  Neck: Normal range of motion.  Cardiovascular: Regular rhythm, normal heart sounds and intact distal pulses.  Bradycardia present.   No murmur heard. Pulmonary/Chest: Effort normal and breath sounds normal. She has no wheezes.  Abdominal: Soft. Bowel sounds are normal. There is no tenderness.  Musculoskeletal: Normal range of motion. She exhibits no edema and  no tenderness.  Neurological: She is alert and oriented to person, place, and time.  Skin: Skin is warm and dry.  Psychiatric: She has a normal mood and affect.    ED Course  Procedures  Patient received aspirin in ed.  Given morphine 4 mg IV with moderate relief of headache and chest  pressure.  MDM Patient with multiple risk factors for coronary source of pain.  Suspect bradycardia from new medicine atenolol.  Call to hospitalists for admission.       Candis Musa, PA 05/10/11 0023    Date: 05/10/2011  Rate: 50  Rhythm: sinus bradycardia  QRS Axis: normal  Intervals: normal  ST/T Wave abnormalities: normal  Conduction Disutrbances:none  Narrative Interpretation:   Old EKG Reviewed: none available    Candis Musa, PA 05/10/11 0030

## 2011-05-10 NOTE — Progress Notes (Signed)
Stress Lab Nurses Notes - [facility]  LILYANNE MCQUOWN 05/10/2011  Reason for doing test: Chest Pain  Type of test: Stress Echo Inpatient Room 315  Nurse performing test: Parke Poisson, RN  Nuclear Medicine Tech: Not Applicable  Echo Tech: Karrie Doffing  MD performing test: R. Rothbart  Family MD:   Test explained and consent signed: yes  IV started: 20g jelco, No redness or edema and Saline lock from floor  Symptoms: Fatigue and SOB  Treatment/Intervention: None  Reason test stopped: fatigue  After recovery IV was: No redness or edema  Patient to return to Nuc. Med at :NA  Patient discharged: Home  Patient's Condition upon discharge was: stable  Comments: Peak BP during test 188/68 & HR 126.  Recovery BP 128/60 & HR 55.  Symptom resolved in recovery. Erskine Speed T

## 2011-05-10 NOTE — Consult Note (Signed)
CARDIOLOGY CONSULT NOTE  Patient ID: MAHALIE KANNER MRN: [id number] DOB/AGE: 1945/10/15 65 y.o.  Admit date: 05/09/2011 Referring Physician: [facility] Primary Physician: Gerda Diss Primary Cardiologist: Dietrich Pates Reason for Consultation: Chest Pain  HPI: 65 y/o patient of Dr. Dietrich Pates with history of nonobstructive CAD per cardiac cath in 2008, Diabetes, and HTN who came to ER with complaints of feeling chest pressure and dizziness and mild diaphoresis.  She was admitted to R/O MI. She states the symptoms began before eating lunch, "just didn't feel good."  Went to lunch and did not eat very much. Checked her BP and it was found to be normal . Got home and still didn't feel well, chest pressure, headache and diaphoresis began, prompting ER evaluation.  In ER she was found to be hypokalemic and bradycardic. K+ was 3.0. HR 47-50.  Normal BP readings. WBC's were mildly elevated at 10.9. She was treated with potassium replacement, morphine and ASA. She has had no further complaints with the exception of continued HA and some mild "soreness in mychest."   Patient believes that a recently added medication for hypertension cause these symptoms.   Review of systems complete and found to be negative unless listed above  Past Medical History  Diagnosis Date  . Hypertension   . Diverticulitis   . Cyst on liver  . Diabetes mellitus     History reviewed. No pertinent family history.  History   Social History  . Marital Status: Married    Spouse Name: N/A    Number of Children: N/A  . Years of Education: N/A   Occupational History  . Not on file.   Social History Main Topics  . Smoking status: Never Smoker   . Smokeless tobacco: Never Used  . Alcohol Use: No  . Drug Use: No  . Sexually Active: Yes   Other Topics Concern  . Not on file   Social History Narrative  . No narrative on file    Past Surgical History  Procedure Date  . Back surgery   . Cholecystectomy   . Knee arthrocentesis  left  . Breast surgery right    cyst removed  . Abdominal hysterectomy   . Tubal ligation      Prescriptions prior to admission  Medication Sig Dispense Refill  . atenolol (TENORMIN) 50 MG tablet Take 25 mg by mouth 2 (two) times daily.        [name] dicyclomine (BENTYL) 20 MG tablet Take 20 mg by mouth 3 (three) times daily.        [name] PROBIOTIC CAPS Take 1 capsule by mouth daily.        [name] triamterene-hydrochlorothiazide (MAXZIDE) 75-50 MG per tablet Take 1 tablet by mouth daily. Patient takes 1/2 tablet daily         Physical Exam: Blood pressure 107/61, pulse 45, temperature 97.5 F (36.4 C), temperature source Oral, resp. rate 18, height 5\' 5"  (1.651 m), weight 220 lb (99.791 kg), SpO2 100.00%.  General: Well developed, obese, well nourished, in no acute distress Head: Eyes PERRLA, No xanthomas.   Normal cephalic and atramatic  Lungs: Clear bilaterally to auscultation and percussion. Heart: HRRR S1 S2, without MRG.  Pulses are 2+ & equal.            No carotid bruit. No JVD.  No abdominal bruits. No femoral bruits. Abdomen: Bowel sounds are positive, abdomen soft and non-tender without masses or  Hernia's noted. Msk:  Back normal, normal gait. Normal strength and tone for age. Extremities: No clubbing, cyanosis or edema.  DP +1 Neuro: Alert and oriented X 3. Psych:  Good affect, responds appropriately  Labs:   Lab Results  Component Value Date   WBC 7.2 05/10/2011   HGB 12.7 05/10/2011   HCT 39.6 05/10/2011   MCV 78.0 05/10/2011   PLT 299 05/10/2011    Lab 05/10/11 0430  NA 136  K 3.6  CL 100  CO2 28  BUN 23  CREATININE 0.87  CALCIUM 9.1  PROT 6.8  BILITOT 0.2*  ALKPHOS 89  ALT 16  AST 20  GLUCOSE 105*   Lab Results  Component Value Date   CKTOTAL 120 05/10/2011   CKMB 3.2 05/10/2011   TROPONINI <0.30 05/10/2011        Radiology: CXR 05/09/11) Findings: Limited by portable technique and patient body habitus.Hypoaeration results in interstitial  crowding. No definite focal consolidation. Cardiomediastinal contours are unchanged, within normal limits. Multilevel degenerative changes. No definite acute osseous abnormality though evaluation is suboptimal due to technical limitations described above.  IMPRESSION: No definite acute process identified.    RUE:AVWUJ bradycardia 50 bpm.    ASSESSMENT AND PLAN:  1.  Chest Pain: EKG and cardiac enzymes are normal at this time.  She has had a stress test this am, results are pending  If negative, she will be recommended for discharge. She has risk factors for CAD, but recent cath in 2008 showed nonobstructive disease, with the only lesion a tubular mid LAD stenosis of 30%. She was found to be hypokalemic on admission and since replacement of this, pain is essentially eliminated.   2. Bradycardia:  I see no history of thyroid disease. Would check TSH assessment,  3. Hypertension: Currently controlled on atenolol and HCTZ,. If she remains bradycardic, may need to discontinue atenolol and start amlodipine or ACE in this patient with diabetes.  Thank you for the consult. Signed: Bettey Mare. Lawrence NP 05/10/2011, 4:24 PM  Cardiology Attending   Patient interviewed and examined. Above note edited and appended as necessary.   Patient has had occasional episodes of chest discomfort in the past with catheterizations in 2005 and 2008 that were essentially unremarkable. She now presents with recurrent discomfort in the setting of starting a new medication for hypertension, I assume atenolol. She was also hypokalemic as a result of her other antihypertensive, Dyazide.   The use of an ACE inhibitor is reasonable in this woman with diabetes, but may be less effective in a black patient. We will treat her with both amlodipine and lisinopril and discontinue beta blocker and diuretic in light of the adverse effects she has experienced.   Chest pain is quite atypical, it is reproduced by chest wall pressure,  and her stress echocardiogram was negative,  She can be discharged at the discretion of [facility]. We will arrange followup as an outpatient.

## 2011-05-10 NOTE — Progress Notes (Signed)
Subjective: This lady was admitted yesterday with chest pain. The pain she describes doesn't sound cardiac in nature with a dull pressing chest pain radiating more to the left chest associated with nausea and sweating, lasting several hours. Unfortunately, her electrocardiogram was completely normal and cardiac enzymes so far are negative. She denies any heavy lifting, excessive coughing. The pain is not medically worse related to respiration. She did have a cardiac catheterization in 2008 which showed nonobstructive coronary artery disease and at that time the chest pain that she did have at that time was not felt to be due to coronary artery disease. She says this pain is somewhat different.           Physical Exam: Blood pressure 155/68, pulse 44, temperature 97.5 F (36.4 C), temperature source Oral, resp. rate 20, height 5\' 5"  (1.651 m), weight 99.791 kg (220 lb), SpO2 100.00%. She looks systemically well. Heart sounds are present and normal without pericardial rub. Lung fields are clear. She is critically tender in the left anterior chest wall, reproducing her pain.   Investigations: Results for orders placed during the hospital encounter of 05/09/11 (from the past 48 hour(s))  CBC     Status: Abnormal   Collection Time   05/09/11  9:44 PM      Component Value Range Comment   WBC 10.9 (*) 4.0 - 10.5 (K/uL)    RBC 5.23 (*) 3.87 - 5.11 (MIL/uL)    Hemoglobin 13.2  12.0 - 15.0 (g/dL)    HCT 16.1  09.6 - 04.5 (%)    MCV 78.0  78.0 - 100.0 (fL)    MCH 25.2 (*) 26.0 - 34.0 (pg)    MCHC 32.4  30.0 - 36.0 (g/dL)    RDW 40.9 (*) 81.1 - 15.5 (%)    Platelets 318  150 - 400 (K/uL)   DIFFERENTIAL     Status: Abnormal   Collection Time   05/09/11  9:44 PM      Component Value Range Comment   Neutrophils Relative 30 (*) 43 - 77 (%)    Neutro Abs 3.2  1.7 - 7.7 (K/uL)    Lymphocytes Relative 57 (*) 12 - 46 (%)    Lymphs Abs 6.2 (*) 0.7 - 4.0 (K/uL)    Monocytes Relative 10  3 - 12 (%)    Monocytes Absolute 1.1 (*) 0.1 - 1.0 (K/uL)    Eosinophils Relative 3  0 - 5 (%)    Eosinophils Absolute 0.3  0.0 - 0.7 (K/uL)    Basophils Relative 0  0 - 1 (%)    Basophils Absolute 0.0  0.0 - 0.1 (K/uL)   BASIC METABOLIC PANEL     Status: Abnormal   Collection Time   05/09/11  9:44 PM      Component Value Range Comment   Sodium 136  135 - 145 (mEq/L)    Potassium 3.0 (*) 3.5 - 5.1 (mEq/L)    Chloride 98  96 - 112 (mEq/L)    CO2 30  19 - 32 (mEq/L)    Glucose, Bld 109 (*) 70 - 99 (mg/dL)    BUN 24 (*) 6 - 23 (mg/dL)    Creatinine, Ser 9.14  0.50 - 1.10 (mg/dL)    Calcium 9.8  8.4 - 10.5 (mg/dL)    GFR calc non Af Amer 52 (*) >60 (mL/min)    GFR calc Af Amer >60  >60 (mL/min)   CARDIAC PANEL(CRET KIN+CKTOT+MB+TROPI)     Status: Abnormal  Collection Time   05/09/11  9:44 PM      Component Value Range Comment   Total CK 144  7 - 177 (U/L)    CK, MB 3.8  0.3 - 4.0 (ng/mL)    Troponin I <0.30  <0.30 (ng/mL)    Relative Index 2.6 (*) 0.0 - 2.5    CBC     Status: Abnormal   Collection Time   05/10/11  4:30 AM      Component Value Range Comment   WBC 7.2  4.0 - 10.5 (K/uL)    RBC 5.08  3.87 - 5.11 (MIL/uL)    Hemoglobin 12.7  12.0 - 15.0 (g/dL)    HCT 16.1  09.6 - 04.5 (%)    MCV 78.0  78.0 - 100.0 (fL)    MCH 25.0 (*) 26.0 - 34.0 (pg)    MCHC 32.1  30.0 - 36.0 (g/dL)    RDW 40.9 (*) 81.1 - 15.5 (%)    Platelets 299  150 - 400 (K/uL)   COMPREHENSIVE METABOLIC PANEL     Status: Abnormal   Collection Time   05/10/11  4:30 AM      Component Value Range Comment   Sodium 136  135 - 145 (mEq/L)    Potassium 3.6  3.5 - 5.1 (mEq/L)    Chloride 100  96 - 112 (mEq/L)    CO2 28  19 - 32 (mEq/L)    Glucose, Bld 105 (*) 70 - 99 (mg/dL)    BUN 23  6 - 23 (mg/dL)    Creatinine, Ser 9.14  0.50 - 1.10 (mg/dL)    Calcium 9.1  8.4 - 10.5 (mg/dL)    Total Protein 6.8  6.0 - 8.3 (g/dL)    Albumin 3.3 (*) 3.5 - 5.2 (g/dL)    AST 20  0 - 37 (U/L)    ALT 16  0 - 35 (U/L)    Alkaline Phosphatase  89  39 - 117 (U/L)    Total Bilirubin 0.2 (*) 0.3 - 1.2 (mg/dL)    GFR calc non Af Amer >60  >60 (mL/min)    GFR calc Af Amer >60  >60 (mL/min)   MAGNESIUM     Status: Normal   Collection Time   05/10/11  4:30 AM      Component Value Range Comment   Magnesium 2.0  1.5 - 2.5 (mg/dL)   CARDIAC PANEL(CRET KIN+CKTOT+MB+TROPI)     Status: Abnormal   Collection Time   05/10/11  6:00 AM      Component Value Range Comment   Total CK 120  7 - 177 (U/L)    CK, MB 3.2  0.3 - 4.0 (ng/mL)    Troponin I <0.30  <0.30 (ng/mL)    Relative Index 2.7 (*) 0.0 - 2.5     No results found for this or any previous visit (from the past 240 hour(s)).  Dg Chest Portable 1 View  05/09/2011  *RADIOLOGY REPORT*  Clinical Data: Chest pain  PORTABLE CHEST - 1 VIEW  Comparison: 01/24/2007  Findings: Limited by portable technique and patient body habitus. Hypoaeration results in interstitial crowding.  No definite focal consolidation.  Cardiomediastinal contours are unchanged, within normal limits.  Multilevel degenerative changes.  No definite acute osseous abnormality though evaluation is suboptimal due to technical limitations described above.  IMPRESSION: No definite acute process identified.  Original Report Authenticated By: Waneta Martins, M.D.      Medications: I have reviewed the patient's current medications.  Impression: 1. Chest pain, possibly musculoskeletal. 2. Hyperlipidemia. 3. Obesity. 4. Hypertension.     Plan: 1. Cardiology consultation. This lady may require stress test. 2. Possible discharge soon depending on cardiology input.     LOS: 1 day   GOSRANI,NIMISH C 05/10/2011, 9:28 AM

## 2011-05-11 ENCOUNTER — Other Ambulatory Visit: Payer: Self-pay

## 2011-05-11 MED ORDER — LISINOPRIL 10 MG PO TABS: 10.0000 mg | ORAL_TABLET | Freq: Every day | ORAL | Status: DC

## 2011-05-11 MED ORDER — HYDROCHLOROTHIAZIDE 12.5 MG PO CAPS: 25.0000 mg | ORAL_CAPSULE | Freq: Every day | ORAL | Status: DC

## 2011-05-11 MED ORDER — ASPIRIN 81 MG PO TBEC: 81.0000 mg | DELAYED_RELEASE_TABLET | Freq: Every day | ORAL | Status: DC

## 2011-05-11 NOTE — Discharge Summary (Signed)
Physician Discharge Summary  Patient ID: [name] MRN: [id number] DOB/AGE: 1946-08-17 65 y.o.  Admit date: 05/09/2011 Discharge date: 05/11/2011    Discharge Diagnoses:  1. Chest pain, stress test negative. 2. Hypertension. 3. Obesity.   Current Discharge Medication List    START taking these medications   Details  aspirin EC 81 MG EC tablet Take 1 tablet (81 mg total) by mouth daily. Qty: 30 tablet, Refills: 0    hydrochlorothiazide (,MICROZIDE/HYDRODIURIL,) 12.5 MG capsule Take 2 capsules (25 mg total) by mouth daily. Qty: 30 capsule, Refills: 0    lisinopril (PRINIVIL,ZESTRIL) 10 MG tablet Take 1 tablet (10 mg total) by mouth daily. Qty: 30 tablet, Refills: 0   Associated Diagnoses: Unspecified essential hypertension      CONTINUE these medications which have NOT CHANGED   Details  dicyclomine (BENTYL) 20 MG tablet Take 20 mg by mouth 3 (three) times daily.      PROBIOTIC CAPS Take 1 capsule by mouth daily.        STOP taking these medications     atenolol (TENORMIN) 50 MG tablet      triamterene-hydrochlorothiazide (MAXZIDE) 75-50 MG per tablet         Discharged Condition: Stable and improved.    Consults: Cardiology, Dr. Dietrich Pates.  Significant Diagnostic Studies: Dg Chest Portable 1 View  05/09/2011  *RADIOLOGY REPORT*  Clinical Data: Chest pain  PORTABLE CHEST - 1 VIEW  Comparison: 01/24/2007  Findings: Limited by portable technique and patient body habitus. Hypoaeration results in interstitial crowding.  No definite focal consolidation.  Cardiomediastinal contours are unchanged, within normal limits.  Multilevel degenerative changes.  No definite acute osseous abnormality though evaluation is suboptimal due to technical limitations described above.  IMPRESSION: No definite acute process identified.  Original Report Authenticated By: Waneta Martins, M.D.    Lab Results: Results for orders placed during the hospital encounter of 05/09/11  (from the past 48 hour(s))  CBC     Status: Abnormal   Collection Time   05/09/11  9:44 PM      Component Value Range Comment   WBC 10.9 (*) 4.0 - 10.5 (K/uL)    RBC 5.23 (*) 3.87 - 5.11 (MIL/uL)    Hemoglobin 13.2  12.0 - 15.0 (g/dL)    HCT 40.9  81.1 - 91.4 (%)    MCV 78.0  78.0 - 100.0 (fL)    MCH 25.2 (*) 26.0 - 34.0 (pg)    MCHC 32.4  30.0 - 36.0 (g/dL)    RDW 78.2 (*) 95.6 - 15.5 (%)    Platelets 318  150 - 400 (K/uL)   DIFFERENTIAL     Status: Abnormal   Collection Time   05/09/11  9:44 PM      Component Value Range Comment   Neutrophils Relative 30 (*) 43 - 77 (%)    Neutro Abs 3.2  1.7 - 7.7 (K/uL)    Lymphocytes Relative 57 (*) 12 - 46 (%)    Lymphs Abs 6.2 (*) 0.7 - 4.0 (K/uL)    Monocytes Relative 10  3 - 12 (%)    Monocytes Absolute 1.1 (*) 0.1 - 1.0 (K/uL)    Eosinophils Relative 3  0 - 5 (%)    Eosinophils Absolute 0.3  0.0 - 0.7 (K/uL)    Basophils Relative 0  0 - 1 (%)    Basophils Absolute 0.0  0.0 - 0.1 (K/uL)   BASIC METABOLIC PANEL     Status: Abnormal   Collection  Time   05/09/11  9:44 PM      Component Value Range Comment   Sodium 136  135 - 145 (mEq/L)    Potassium 3.0 (*) 3.5 - 5.1 (mEq/L)    Chloride 98  96 - 112 (mEq/L)    CO2 30  19 - 32 (mEq/L)    Glucose, Bld 109 (*) 70 - 99 (mg/dL)    BUN 24 (*) 6 - 23 (mg/dL)    Creatinine, Ser 1.61  0.50 - 1.10 (mg/dL)    Calcium 9.8  8.4 - 10.5 (mg/dL)    GFR calc non Af Amer 52 (*) >60 (mL/min)    GFR calc Af Amer >60  >60 (mL/min)   CARDIAC PANEL(CRET KIN+CKTOT+MB+TROPI)     Status: Abnormal   Collection Time   05/09/11  9:44 PM      Component Value Range Comment   Total CK 144  7 - 177 (U/L)    CK, MB 3.8  0.3 - 4.0 (ng/mL)    Troponin I <0.30  <0.30 (ng/mL)    Relative Index 2.6 (*) 0.0 - 2.5    CBC     Status: Abnormal   Collection Time   05/10/11  4:30 AM      Component Value Range Comment   WBC 7.2  4.0 - 10.5 (K/uL)    RBC 5.08  3.87 - 5.11 (MIL/uL)    Hemoglobin 12.7  12.0 - 15.0 (g/dL)    HCT  09.6  04.5 - 40.9 (%)    MCV 78.0  78.0 - 100.0 (fL)    MCH 25.0 (*) 26.0 - 34.0 (pg)    MCHC 32.1  30.0 - 36.0 (g/dL)    RDW 81.1 (*) 91.4 - 15.5 (%)    Platelets 299  150 - 400 (K/uL)   COMPREHENSIVE METABOLIC PANEL     Status: Abnormal   Collection Time   05/10/11  4:30 AM      Component Value Range Comment   Sodium 136  135 - 145 (mEq/L)    Potassium 3.6  3.5 - 5.1 (mEq/L)    Chloride 100  96 - 112 (mEq/L)    CO2 28  19 - 32 (mEq/L)    Glucose, Bld 105 (*) 70 - 99 (mg/dL)    BUN 23  6 - 23 (mg/dL)    Creatinine, Ser 7.82  0.50 - 1.10 (mg/dL)    Calcium 9.1  8.4 - 10.5 (mg/dL)    Total Protein 6.8  6.0 - 8.3 (g/dL)    Albumin 3.3 (*) 3.5 - 5.2 (g/dL)    AST 20  0 - 37 (U/L)    ALT 16  0 - 35 (U/L)    Alkaline Phosphatase 89  39 - 117 (U/L)    Total Bilirubin 0.2 (*) 0.3 - 1.2 (mg/dL)    GFR calc non Af Amer >60  >60 (mL/min)    GFR calc Af Amer >60  >60 (mL/min)   MAGNESIUM     Status: Normal   Collection Time   05/10/11  4:30 AM      Component Value Range Comment   Magnesium 2.0  1.5 - 2.5 (mg/dL)   CARDIAC PANEL(CRET KIN+CKTOT+MB+TROPI)     Status: Abnormal   Collection Time   05/10/11  6:00 AM      Component Value Range Comment   Total CK 120  7 - 177 (U/L)    CK, MB 3.2  0.3 - 4.0 (ng/mL)    Troponin I <0.30  <  0.30 (ng/mL)    Relative Index 2.7 (*) 0.0 - 2.5    CARDIAC PANEL(CRET KIN+CKTOT+MB+TROPI)     Status: Abnormal   Collection Time   05/10/11  3:54 PM      Component Value Range Comment   Total CK 131  7 - 177 (U/L)    CK, MB 3.4  0.3 - 4.0 (ng/mL)    Troponin I <0.30  <0.30 (ng/mL)    Relative Index 2.6 (*) 0.0 - 2.5    TSH     Status: Normal   Collection Time   05/10/11  4:46 PM      Component Value Range Comment   TSH 3.469  0.350 - 4.500 (uIU/mL)   URINALYSIS, ROUTINE W REFLEX MICROSCOPIC     Status: Abnormal   Collection Time   05/10/11  5:45 PM      Component Value Range Comment   Color, Urine YELLOW  YELLOW     Appearance CLEAR  CLEAR      Specific Gravity, Urine >1.030 (*) 1.005 - 1.030     pH 5.5  5.0 - 8.0     Glucose, UA NEGATIVE  NEGATIVE (mg/dL)    Hgb urine dipstick NEGATIVE  NEGATIVE     Bilirubin Urine NEGATIVE  NEGATIVE     Ketones, ur NEGATIVE  NEGATIVE (mg/dL)    Protein, ur NEGATIVE  NEGATIVE (mg/dL)    Urobilinogen, UA 0.2  0.0 - 1.0 (mg/dL)    Nitrite NEGATIVE  NEGATIVE     Leukocytes, UA NEGATIVE  NEGATIVE  MICROSCOPIC NOT DONE ON URINES WITH NEGATIVE PROTEIN, BLOOD, LEUKOCYTES, NITRITE, OR GLUCOSE <1000 mg/dL.      Hospital Course: This 64 year old lady was admitted with chest pressure associated with dizziness. Please see initial history and physical examination done by Dr. Vania Rea. The patient was appropriately admitted to telemetry and serial cardiac enzymes were cycled. Her pain did improve. Her electrocardiogram did not show any acute ST-T wave changes. She was seen by cardiology, Dr. Dietrich Pates and she underwent a stress test which was negative. Her symptoms started after she began atenolol for her hypertension. Her symptoms are probably related to this. Today she feels well and is symptom free.  Discharge Exam: Blood pressure 91/45, pulse 52, temperature 97.4 F (36.3 C), temperature source Oral, resp. rate 24, height 5\' 5"  (1.651 m), weight 99.791 kg (220 lb), SpO2 100.00%. She looks systemically well. Heart sounds are present and normal without gallop rhythm. Lung fields are clear. She is alert and orientated without any focal neurological signs.  Disposition: Home. She will followup with cardiology within a week.  Discharge Orders    Future Appointments: Provider: Department: Dept Phone: Center:   05/18/2011 1:40 PM Joni Reining, NP [facility] [phone] [id number]     Future Orders Please Complete By Expires   Diet - low sodium heart healthy      Increase activity slowly         Follow-up Information    Follow up with Joni Reining, NP. Make an appointment on  05/18/2011. (1:40 [facility]  Needs labs first)    Contact information:   [address] [place] [address] [phone]          Signed: Wilson Singer 05/11/2011, 8:57 AM

## 2011-05-11 NOTE — Consult Note (Signed)
PROGRESS NOTE: Cardiology  SUBJECTIVE:No chest pain or dyspnea.   Filed Vitals:   05/10/11 0531 05/10/11 1400 05/10/11 2200 05/11/11 0547  BP: 155/68 107/61 105/46 91/45  Pulse: 44 45 82 52  Temp: 97.5 F (36.4 C) 97.5 F (36.4 C) 97.6 F (36.4 C) 97.4 F (36.3 C)  TempSrc: Oral Oral    Resp:  18 24 24   Height:      Weight:      SpO2: 100% 100% 93% 100%    Intake/Output Summary (Last 24 hours) at 05/11/11 0837 Last data filed at 05/10/11 1800  Gross per 24 hour  Intake    180 ml  Output    150 ml  Net     30 ml   PHYSICAL EXAM General: Well developed, well nourished, in no acute distress Head: Eyes PERRLA, No xanthomas.   Normal cephalic and atramatic  Lungs: Clear bilaterally to auscultation and percussion. Heart: HRRR S1 S2, with soft S4 murmur.  Pulses are 2+ & equal.            No carotid bruit. No JVD.  No abdominal bruits. No femoral bruits. Abdomen: Bowel sounds are positive, abdomen soft and non-tender without masses or                  Hernia's noted. Msk:  Back normal, normal gait. Normal strength and tone for age. Extremities: No clubbing, cyanosis or edema.  DP +1 Neuro: Alert and oriented X 3. Psych:  Good affect, responds appropriately  TELEMETRY: Reviewed telemetry-NSR   ASSESSMENT AND PLAN:  1. Chest Pain: EKG and cardiac enzymes are normal at this time. She has had a stress test which was negative, she is recommended for discharge. She has risk factors for CAD, but recent cath in 2008 showed nonobstructive disease. She was found to be hypokalemic on admission and since replacement of this, pain is essentially eliminated.  2. Bradycardia: I see no history of thyroid disease. TSH is WNL. Will stop atenolol.This change has been discussed with the patient. She is now aware not to continue medication at home. 3. Hypertension: Continue lisinopril 10 mg daily.  Will follow-up in the office in 1 week for re-evaluation of BP control. Can consider BID dosing if  necessary.  Check BMET prior appointment for K+ status and renal fx.  Bettey Mare. Lawrence NP [facility]  Cardiology Attending  Symptoms have resolved.  There is no evidence for progression of minimal coronary disease seen on coronary angiography 3 years ago.  Okay for discharge with office followup for management of hypertension and other cardiovascular risk factors.  Zillah Bing, M.D.

## 2011-05-11 NOTE — Progress Notes (Signed)
Pt discharged home today per Dr. Karilyn Cota. Pt's IV site D/C'd and WNL. Pt's VS stable at this time. Pt provided with home medication list, discharge instructions and prescriptions. Pt verbalized understanding. Pt left floor via WC accompanied by Dagoberto Ligas, RN in stable condition.

## 2011-05-12 LAB — URINE CULTURE: Culture  Setup Time: 201209111326

## 2011-05-18 ENCOUNTER — Ambulatory Visit (INDEPENDENT_AMBULATORY_CARE_PROVIDER_SITE_OTHER): Payer: Self-pay | Admitting: Adult Health

## 2011-05-18 ENCOUNTER — Encounter: Payer: Self-pay | Admitting: Adult Health

## 2011-05-18 ENCOUNTER — Encounter: Payer: Self-pay | Admitting: *Deleted

## 2011-05-18 VITALS — BP 124/75 | HR 57 | Resp 18 | Ht 66.0 in | Wt 246.8 lb

## 2011-05-18 DIAGNOSIS — I10 Essential (primary) hypertension: Secondary | ICD-10-CM

## 2011-05-18 DIAGNOSIS — R079 Chest pain, unspecified: Secondary | ICD-10-CM

## 2011-05-18 DIAGNOSIS — Z7901 Long term (current) use of anticoagulants: Secondary | ICD-10-CM

## 2011-05-18 MED ORDER — LISINOPRIL 10 MG PO TABS: 10.0000 mg | ORAL_TABLET | Freq: Every day | ORAL | Status: DC

## 2011-05-18 MED ORDER — HYDROCHLOROTHIAZIDE 12.5 MG PO CAPS: 25.0000 mg | ORAL_CAPSULE | Freq: Every day | ORAL | Status: DC

## 2011-05-18 MED ORDER — NITROGLYCERIN 0.4 MG SL SUBL: 0.4000 mg | SUBLINGUAL_TABLET | SUBLINGUAL | Status: DC | PRN

## 2011-05-18 NOTE — Assessment & Plan Note (Addendum)
She continues to have exertional dyspnea and chest discomfort occuring with and without exertion.  She has normal stress test during hospitalization in early Sept, and nonobstructive disease in 2008. We have discussed this with her and she requests repeat catheterization. We have discussed risks and benefits. She has had two cardiac cath's before and is aware of the procedure. I mentioned that the cardiologist may use her wrist for insertion site and she became quite anxious about this. Now she wishes to think about having the cath or not.  I have given her Rx for NTG sublingual.  We have discussed other reasons for fatigue and shortness of breath, and possibility of doing sleep study has she had the body habitus for OSA.  She wishes to think about this as well. She will call and let us know if she wishes to proceed. The patient has been seen by Dr. Diona Browner on site as well.

## 2011-05-18 NOTE — Progress Notes (Signed)
Patient of Dr. Dietrich Pates seen by me with Ms. Lawrence today. History was reviewed. She was recently hospitalized with chest pain in early September, ruled out for myocardial infarction, and had a negative exercise echocardiogram. Previous history includes mild, nonobstructive CAD at catheterization, most recently in 2008. Since hospital discharge she reports progressive dyspnea on exertion and fatigue, also recent chest "heaviness." She is very concerned about her symptoms, and wanted to discuss proceeding to heart catheterization. Although her symptoms could be multifactorial, perhaps even related to endothelial dysfunction, the recent progression likely warrants additional assessment. We reviewed the risks and benefits of a diagnostic cardiac catheterization (most likely left and right heart) and asked her to consider this further. Procedure can be arranged this week if she agrees.

## 2011-05-18 NOTE — Assessment & Plan Note (Signed)
She was taken off of atenolol secondary to bradycardia, which we thought was contributing to her fatigue and chest discomfort. TSH was ordered and found to be 3.469 during admission.  She is tolerating only lisinopril with stable BP. May consider changing to CCB if she is having vasospastic chest discomfort for BP control. Will reassess on follow-up.

## 2011-05-18 NOTE — Progress Notes (Signed)
HPI: [name] is an anxious 65 y/o obese patient of Dr. Dietrich Pates we are seeing post hospitalization for complaints of chest pain. She was seen in consultation and had stress test that was found to be negative. She has a history of nonobstructive CAD with most recent cardiac cath in 2008, with 30%-40% tubular lesion in the midsection of the LAD.  She was given reassurance and discharged.  Since coming home she is experiencing worsening left sided chest pain, described as heaviness, with radiation to her left upper back and squeezing in the upper left arm. She is having more DOE and more fatigue. She has become very concerned about this and "just wants to stop hurting." She is requesting a repeat cardiac catheterization.  Allergies  Allergen Reactions  . Penicillins     REACTION: unknown reaction    Current Outpatient Prescriptions  Medication Sig Dispense Refill  . aspirin EC 81 MG EC tablet Take 1 tablet (81 mg total) by mouth daily.  30 tablet  0  . dicyclomine (BENTYL) 20 MG tablet Take 20 mg by mouth 3 (three) times daily.        . hydrochlorothiazide (MICROZIDE) 12.5 MG capsule Take 2 capsules (25 mg total) by mouth daily.  60 capsule  6  . lisinopril (PRINIVIL,ZESTRIL) 10 MG tablet Take 1 tablet (10 mg total) by mouth daily.  30 tablet  6  . PROBIOTIC CAPS Take 1 capsule by mouth daily.        . nitroGLYCERIN (NITROSTAT) 0.4 MG SL tablet Place 1 tablet (0.4 mg total) under the tongue every 5 (five) minutes as needed for chest pain.  25 tablet  6    Past Medical History  Diagnosis Date  . Hypertension   . Diverticulitis   . Cyst on liver  . Diabetes mellitus     Past Surgical History  Procedure Date  . Back surgery   . Cholecystectomy   . Knee arthrocentesis left  . Breast surgery right    cyst removed  . Abdominal hysterectomy   . Tubal ligation     ZOX:WRUEAV of systems complete and found to be negative unless listed above PHYSICAL EXAM BP 124/75  Pulse 57   Resp 18  Ht 5\' 6"  (1.676 m)  Wt 246 lb 12.8 oz (111.948 kg)  BMI 39.83 kg/m2  SpO2 98%  EKG: NSR rate of 62 bpm, with Q-waves noted in lead I.  ASSESSMENT AND PLAN

## 2011-05-18 NOTE — Patient Instructions (Signed)
Your physician recommends that you schedule a follow-up appointment in: after cath  Your physician has recommended you make the following change in your medication: KEEP Nitro nearby for chest pain   Your physician has requested that you have a cardiac catheterization. Cardiac catheterization is used to diagnose and/or treat various heart conditions. Doctors may recommend this procedure for a number of different reasons. The most common reason is to evaluate chest pain. Chest pain can be a symptom of coronary artery disease (CAD), and cardiac catheterization can show whether plaque is narrowing or blocking your heart's arteries. This procedure is also used to evaluate the valves, as well as measure the blood flow and oxygen levels in different parts of your heart. For further information please visit [email or web address] Please follow instruction sheet, as given. PLEASE HOLD HCTZ ON THE MORNING OF YOUR HEART CATH!!!!

## 2011-05-19 ENCOUNTER — Ambulatory Visit (HOSPITAL_COMMUNITY)
Admission: RE | Admit: 2011-05-19 | Discharge: 2011-05-19 | Disposition: A | Discharge status: Home / Self Care | Payer: Self-pay | Source: Ambulatory Visit | Attending: Cardiovascular Disease | Admitting: Cardiovascular Disease

## 2011-05-19 DIAGNOSIS — R5381 Other malaise: Secondary | ICD-10-CM | POA: Insufficient documentation

## 2011-05-19 DIAGNOSIS — R0609 Other forms of dyspnea: Secondary | ICD-10-CM | POA: Insufficient documentation

## 2011-05-19 DIAGNOSIS — I2510 Atherosclerotic heart disease of native coronary artery without angina pectoris: Secondary | ICD-10-CM | POA: Insufficient documentation

## 2011-05-19 DIAGNOSIS — R0989 Other specified symptoms and signs involving the circulatory and respiratory systems: Secondary | ICD-10-CM | POA: Insufficient documentation

## 2011-05-19 DIAGNOSIS — E119 Type 2 diabetes mellitus without complications: Secondary | ICD-10-CM | POA: Insufficient documentation

## 2011-05-19 DIAGNOSIS — R079 Chest pain, unspecified: Secondary | ICD-10-CM

## 2011-05-19 DIAGNOSIS — E669 Obesity, unspecified: Secondary | ICD-10-CM | POA: Insufficient documentation

## 2011-05-19 DIAGNOSIS — I10 Essential (primary) hypertension: Secondary | ICD-10-CM | POA: Insufficient documentation

## 2011-05-19 LAB — CBC
HCT: 41.2 % (ref 36.0–46.0)
MCH: 25 pg — ABNORMAL LOW (ref 26.0–34.0)
MCV: 75.9 fL — ABNORMAL LOW (ref 78.0–100.0)
Platelets: 296 10*3/uL (ref 150–400)
RBC: 5.43 MIL/uL — ABNORMAL HIGH (ref 3.87–5.11)

## 2011-05-19 LAB — BASIC METABOLIC PANEL
BUN: 16 mg/dL (ref 6–23)
CO2: 30 mEq/L (ref 19–32)
Calcium: 9.6 mg/dL (ref 8.4–10.5)
Chloride: 97 mEq/L (ref 96–112)
Creatinine, Ser: 0.86 mg/dL (ref 0.50–1.10)

## 2011-05-19 LAB — GLUCOSE, CAPILLARY
Glucose-Capillary: 119 mg/dL — ABNORMAL HIGH (ref 70–99)
Glucose-Capillary: 106 mg/dL — ABNORMAL HIGH (ref 70–99)

## 2011-05-19 LAB — PROTIME-INR: Prothrombin Time: 13.8 seconds (ref 11.6–15.2)

## 2011-05-19 NOTE — Cardiovascular Report (Signed)
  NAMEMICHELA, [name] NO.:  [id number]  MEDICAL RECORD NO.:  [id number]  LOCATION:  [facility]                         FACILITY:  [facility]  PHYSICIAN:  Verne Carrow, MDDATE OF BIRTH:  [birth year]  DATE OF PROCEDURE:  05/19/2011 DATE OF DISCHARGE:                           CARDIAC CATHETERIZATION   PRIMARY CARDIOLOGIST:  Gerrit Friends. Dietrich Pates, MD, [facility]  PROCEDURES PERFORMED: 1. Left heart catheterization. 2. Selective coronary angiography. 3. Left ventricular angiogram.  OPERATOR:  Verne Carrow, MD  INDICATIONS:  Chest pain in an obese African American female with hypertension and diabetes mellitus who underwent cardiac catheterization in 2008 and was found to have a mild 30% stenosis in the mid left anterior descending artery.  The patient has had recent worsening left- sided chest pain described as heaviness with radiation to her left upper back and squeezing in the upper left arm.  She also complained of more dyspnea on exertion and fatigue.  Diagnostic catheterization was arranged by the providers in the [facility] office.  PROCEDURE IN DETAIL:  The patient was brought to the main cardiac catheterization laboratory after signing informed consent for the procedure.  An Freida Busman test was performed on the right wrist and was negative.  Because of this, we elected to proceed with access through the right femoral artery.  The right groin was prepped and draped in a sterile fashion.  Lidocaine 1% was used for local anesthesia.  A 5- French sheath was inserted into the right femoral artery without difficulty.  Standard diagnostic catheters were used to perform selective coronary angiography.  A pigtail catheter was used to perform a left ventricular angiogram.  There were no immediate complications. The patient tolerated the procedure well.  The patient was taken to the recovery area in stable condition.  HEMODYNAMIC FINDINGS:  Central  aortic pressure 141/67.  Left ventricular pressure 147/6/11.  ANGIOGRAPHIC FINDINGS: 1. The left main coronary artery was a short segment and had no     disease. 2. The left anterior descending was a large vessel that coursed to the     apex and gave off a moderate-sized diagonal branch.  The midportion     of the left anterior descending artery had a mild 20% stenosis.     The diagonal branch was free of any disease. 3. Circumflex artery is a moderate-sized vessel that gives off a     moderate-sized bifurcating obtuse marginal branch.  This vessel is     free of any disease. 4. The right coronary artery is a moderate-sized dominant vessel with     no evidence of disease. 5. Left ventricular angiogram was performed in the RAO projection and     it showed normal left ventricular systolic function with ejection     fraction of 65-70%.  IMPRESSION: 1. Mild nonobstructive coronary artery disease. 2. Normal left ventricular systolic function. 3. Noncardiac chest pain.  RECOMMENDATIONS:  I recommend continued medical management.     Verne Carrow, MD     CM/MEDQ  D:  05/19/2011  T:  05/19/2011  Job:  [id number]  cc:   Gerrit Friends. Dietrich Pates, MD, [facility] Bettey Mare. Lyman Bishop, NP  Electronically Signed by Verne Carrow MD on 05/19/2011 04:08:34 PM

## 2011-05-20 ENCOUNTER — Encounter: Payer: Self-pay | Admitting: Adult Health

## 2011-05-20 ENCOUNTER — Telehealth: Payer: Self-pay | Admitting: Adult Health

## 2011-05-20 NOTE — Telephone Encounter (Signed)
PT HAD CATH DONE YESTERDAY AND WOULD LIKE TO TALK TO A NURSE ABOUT IT.

## 2011-05-27 ENCOUNTER — Encounter: Payer: Self-pay | Admitting: Adult Health

## 2011-05-27 ENCOUNTER — Ambulatory Visit (INDEPENDENT_AMBULATORY_CARE_PROVIDER_SITE_OTHER): Payer: Self-pay | Admitting: Adult Health

## 2011-05-27 VITALS — BP 132/66 | HR 56 | Resp 18 | Ht 65.0 in | Wt 245.0 lb

## 2011-05-27 DIAGNOSIS — R079 Chest pain, unspecified: Secondary | ICD-10-CM

## 2011-05-27 LAB — CBC
HCT: 37.9
Hemoglobin: 12.6
MCHC: 33.3
MCV: 77.9 — ABNORMAL LOW
RBC: 4.87
RDW: 15.5

## 2011-05-27 LAB — DIFFERENTIAL
Basophils Absolute: 0
Basophils Relative: 0
Eosinophils Relative: 2
Monocytes Absolute: 0.4

## 2011-05-27 LAB — COMPREHENSIVE METABOLIC PANEL
ALT: 18
BUN: 17
CO2: 29
Calcium: 9.2
Creatinine, Ser: 0.69
GFR calc non Af Amer: >60
Glucose, Bld: 101 — ABNORMAL HIGH
Sodium: 143
Total Protein: 6.9

## 2011-05-27 LAB — LIPASE, BLOOD: Lipase: 20

## 2011-05-27 NOTE — Patient Instructions (Signed)
Your physician recommends that you schedule a follow-up appointment in: 1 year  Please follow a low carb diet - under 2000 calories daily

## 2011-05-27 NOTE — Progress Notes (Signed)
HPI: [name] is an anxious 65 y/o obese patient of Dr. Dietrich Pates we are seeing post hospitalization for complaints of chest pain. She was seen in consultation and had stress test that was found to be negative. She has a history of nonobstructive CAD with most recent cardiac cath in 2008, with 30%-40% tubular lesion in the midsection of the LAD.  She was given reassurance and discharged.  Since coming home she is experiencing worsening left sided chest pain, described as heaviness, with radiation to her left upper back and squeezing in the upper left arm. She is having more DOE and more fatigue. She has become very concerned about this and "just wants to stop hurting." She is requesting a repeat cardiac catheterization. This is completed.  She is here on follow-up after catheterization.  Allergies  Allergen Reactions  . Penicillins     REACTION: unknown reaction    Current Outpatient Prescriptions  Medication Sig Dispense Refill  . aspirin EC 81 MG EC tablet Take 1 tablet (81 mg total) by mouth daily.  30 tablet  0  . dicyclomine (BENTYL) 20 MG tablet Take 20 mg by mouth 3 (three) times daily.        . hydrochlorothiazide (MICROZIDE) 12.5 MG capsule Take 2 capsules (25 mg total) by mouth daily.  60 capsule  6  . lisinopril (PRINIVIL,ZESTRIL) 10 MG tablet Take 1 tablet (10 mg total) by mouth daily.  30 tablet  6  . nitroGLYCERIN (NITROSTAT) 0.4 MG SL tablet Place 1 tablet (0.4 mg total) under the tongue every 5 (five) minutes as needed for chest pain.  25 tablet  6  . omeprazole (PRILOSEC) 20 MG capsule Take 20 mg by mouth daily.        [name] PROBIOTIC CAPS Take 1 capsule by mouth daily.          Past Medical History  Diagnosis Date  . Hypertension   . Diverticulitis   . Cyst on liver  . Diabetes mellitus     Past Surgical History  Procedure Date  . Back surgery   . Cholecystectomy   . Knee arthrocentesis left  . Breast surgery right    cyst removed  . Abdominal hysterectomy     . Tubal ligation     NWG:NFAOZH of systems complete and found to be negative unless listed above PHYSICAL EXAM BP 132/66  Pulse 56  Resp 18  Ht 5\' 5"  (1.651 m)  Wt 245 lb (111.131 kg)  BMI 40.77 kg/m2  SpO2 93% General: Well developed, well nourished, in no acute distress, very anxious Lungs: Clear bilaterally to auscultation and percussion. Heart: HRRR S1 S2, without MRG.  Pulses are 2+ & equal. No carotid bruit. No JVD.  No abdominal bruits. No femoral bruits.  Neuro: Alert and oriented X 3. Psych:  Good affect, responds appropriately  EKG: NSR rate of 62 bpm, with Q-waves noted in lead I.  ASSESSMENT AND PLAN

## 2011-05-27 NOTE — Assessment & Plan Note (Addendum)
I have reviewed cardiac catheterization with her in detail. "Mild nonobstructive CAD, normal LVEF of 65%-70%.  Noncardiac chest pain. She is given ample reassurance.  Spent over 30 minutes with this patient discussing her need to increase exercise, lose wt, and not to take her BP several times a day. She is advised to speak with Dr. Gerda Diss concerning work-up for non cardiac chest pain, RE: GI, musculoskeletal. We will see her in one year. I have given her literature for low carb diet.  I have discussed walking regimen and going to [facility] and swimming. She and her husband verbalize understanding. TSH is WNL and this was also discussed with the patient.  Fatigue is not related to thyroid disease.

## 2011-10-07 ENCOUNTER — Other Ambulatory Visit: Payer: Self-pay | Admitting: Family Medicine

## 2011-10-07 DIAGNOSIS — Z139 Encounter for screening, unspecified: Secondary | ICD-10-CM

## 2011-10-14 ENCOUNTER — Ambulatory Visit (HOSPITAL_COMMUNITY)
Admission: RE | Admit: 2011-10-14 | Discharge: 2011-10-14 | Disposition: A | Discharge status: Home / Self Care | Payer: BC Managed Care – PPO | Source: Ambulatory Visit | Attending: Family Medicine | Admitting: Family Medicine

## 2011-10-14 DIAGNOSIS — Z139 Encounter for screening, unspecified: Secondary | ICD-10-CM

## 2012-01-11 ENCOUNTER — Other Ambulatory Visit: Payer: Self-pay | Admitting: Family Medicine

## 2012-01-11 DIAGNOSIS — R52 Pain, unspecified: Secondary | ICD-10-CM

## 2012-01-12 ENCOUNTER — Ambulatory Visit (HOSPITAL_COMMUNITY)
Admission: RE | Admit: 2012-01-12 | Discharge: 2012-01-12 | Disposition: A | Discharge status: Home / Self Care | Payer: Self-pay | Source: Ambulatory Visit | Attending: Family Medicine | Admitting: Family Medicine

## 2012-01-12 DIAGNOSIS — M79609 Pain in unspecified limb: Secondary | ICD-10-CM | POA: Insufficient documentation

## 2012-01-12 DIAGNOSIS — M7989 Other specified soft tissue disorders: Secondary | ICD-10-CM | POA: Insufficient documentation

## 2012-01-12 DIAGNOSIS — R52 Pain, unspecified: Secondary | ICD-10-CM

## 2012-01-26 ENCOUNTER — Other Ambulatory Visit: Payer: Self-pay | Admitting: Family Medicine

## 2012-01-26 ENCOUNTER — Ambulatory Visit (HOSPITAL_COMMUNITY)
Admission: RE | Admit: 2012-01-26 | Discharge: 2012-01-26 | Disposition: A | Discharge status: Home / Self Care | Payer: Self-pay | Source: Ambulatory Visit | Attending: Family Medicine | Admitting: Family Medicine

## 2012-01-26 DIAGNOSIS — R059 Cough, unspecified: Secondary | ICD-10-CM | POA: Insufficient documentation

## 2012-01-26 DIAGNOSIS — R05 Cough: Secondary | ICD-10-CM

## 2012-01-27 ENCOUNTER — Other Ambulatory Visit: Payer: Self-pay | Admitting: Family Medicine

## 2012-01-27 DIAGNOSIS — M79603 Pain in arm, unspecified: Secondary | ICD-10-CM

## 2012-01-27 DIAGNOSIS — R609 Edema, unspecified: Secondary | ICD-10-CM

## 2012-01-28 ENCOUNTER — Ambulatory Visit (HOSPITAL_COMMUNITY)
Admission: RE | Admit: 2012-01-28 | Discharge: 2012-01-28 | Disposition: A | Discharge status: Home / Self Care | Payer: Self-pay | Source: Ambulatory Visit | Attending: Family Medicine | Admitting: Family Medicine

## 2012-01-28 DIAGNOSIS — M79603 Pain in arm, unspecified: Secondary | ICD-10-CM

## 2012-01-28 DIAGNOSIS — M7989 Other specified soft tissue disorders: Secondary | ICD-10-CM | POA: Insufficient documentation

## 2012-01-28 DIAGNOSIS — R609 Edema, unspecified: Secondary | ICD-10-CM

## 2012-01-28 DIAGNOSIS — M79609 Pain in unspecified limb: Secondary | ICD-10-CM | POA: Insufficient documentation

## 2012-01-28 DIAGNOSIS — I82A19 Acute embolism and thrombosis of unspecified axillary vein: Secondary | ICD-10-CM | POA: Insufficient documentation

## 2012-02-12 ENCOUNTER — Inpatient Hospital Stay (HOSPITAL_COMMUNITY)
Admission: EM | Admit: 2012-02-12 | Discharge: 2012-02-16 | DRG: 563 | Disposition: A | Discharge status: Home / Self Care | Payer: MEDICAID | Attending: Orthopedic Surgery | Admitting: Orthopedic Surgery

## 2012-02-12 ENCOUNTER — Emergency Department (HOSPITAL_COMMUNITY): Payer: Self-pay

## 2012-02-12 ENCOUNTER — Encounter (HOSPITAL_COMMUNITY): Payer: Self-pay | Admitting: Emergency Medicine

## 2012-02-12 DIAGNOSIS — W19XXXA Unspecified fall, initial encounter: Secondary | ICD-10-CM | POA: Diagnosis present

## 2012-02-12 DIAGNOSIS — I742 Embolism and thrombosis of arteries of the upper extremities: Secondary | ICD-10-CM | POA: Diagnosis present

## 2012-02-12 DIAGNOSIS — S40011A Contusion of right shoulder, initial encounter: Secondary | ICD-10-CM

## 2012-02-12 DIAGNOSIS — E119 Type 2 diabetes mellitus without complications: Secondary | ICD-10-CM | POA: Diagnosis present

## 2012-02-12 DIAGNOSIS — Z7901 Long term (current) use of anticoagulants: Secondary | ICD-10-CM

## 2012-02-12 DIAGNOSIS — S82843A Displaced bimalleolar fracture of unspecified lower leg, initial encounter for closed fracture: Principal | ICD-10-CM | POA: Diagnosis present

## 2012-02-12 DIAGNOSIS — I10 Essential (primary) hypertension: Secondary | ICD-10-CM | POA: Diagnosis present

## 2012-02-12 DIAGNOSIS — Z880 Allergy status to penicillin: Secondary | ICD-10-CM

## 2012-02-12 DIAGNOSIS — T45515A Adverse effect of anticoagulants, initial encounter: Secondary | ICD-10-CM

## 2012-02-12 DIAGNOSIS — S82899A Other fracture of unspecified lower leg, initial encounter for closed fracture: Secondary | ICD-10-CM

## 2012-02-12 DIAGNOSIS — Z833 Family history of diabetes mellitus: Secondary | ICD-10-CM

## 2012-02-12 DIAGNOSIS — S93409A Sprain of unspecified ligament of unspecified ankle, initial encounter: Secondary | ICD-10-CM

## 2012-02-12 DIAGNOSIS — S40019A Contusion of unspecified shoulder, initial encounter: Secondary | ICD-10-CM | POA: Diagnosis present

## 2012-02-12 DIAGNOSIS — S82891A Other fracture of right lower leg, initial encounter for closed fracture: Secondary | ICD-10-CM

## 2012-02-12 HISTORY — DX: Embolism and thrombosis of arteries of the upper extremities: I74.2

## 2012-02-12 LAB — CBC
MCH: 24.8 pg — ABNORMAL LOW (ref 26.0–34.0)
MCV: 76.6 fL — ABNORMAL LOW (ref 78.0–100.0)
Platelets: 313 10*3/uL (ref 150–400)
RBC: 5 MIL/uL (ref 3.87–5.11)
RDW: 16.3 % — ABNORMAL HIGH (ref 11.5–15.5)
WBC: 7.2 10*3/uL (ref 4.0–10.5)

## 2012-02-12 LAB — BASIC METABOLIC PANEL
Calcium: 9.2 mg/dL (ref 8.4–10.5)
GFR calc Af Amer: >90 mL/min (ref >90)
GFR calc non Af Amer: 90 mL/min — ABNORMAL LOW (ref >90)
Potassium: 4 mEq/L (ref 3.5–5.1)
Sodium: 137 mEq/L (ref 135–145)

## 2012-02-12 LAB — DIFFERENTIAL
Basophils Absolute: 0 10*3/uL (ref 0.0–0.1)
Eosinophils Absolute: 0.2 10*3/uL (ref 0.0–0.7)
Eosinophils Relative: 3 % (ref 0–5)
Neutrophils Relative %: 60 % (ref 43–77)

## 2012-02-12 LAB — PROTIME-INR: Prothrombin Time: 22.8 seconds — ABNORMAL HIGH (ref 11.6–15.2)

## 2012-02-12 MED ORDER — DICYCLOMINE HCL 20 MG PO TABS
20.0000 mg | ORAL_TABLET | Freq: Three times a day (TID) | ORAL | Status: DC | PRN
  Filled 2012-02-12: qty 1

## 2012-02-12 MED ORDER — METOCLOPRAMIDE HCL 5 MG/ML IJ SOLN
10.0000 mg | Freq: Four times a day (QID) | INTRAMUSCULAR | Status: AC
  Administered 2012-02-12 – 2012-02-14 (×7): 10 mg via INTRAVENOUS
  Filled 2012-02-12 (×7): qty 2

## 2012-02-12 MED ORDER — HYDROCODONE-ACETAMINOPHEN 5-325 MG PO TABS
1.0000 | ORAL_TABLET | ORAL | Status: DC | PRN
  Administered 2012-02-12 – 2012-02-14 (×9): 1 via ORAL
  Filled 2012-02-12 (×9): qty 1

## 2012-02-12 MED ORDER — HYDROCHLOROTHIAZIDE 25 MG PO TABS
25.0000 mg | ORAL_TABLET | Freq: Every day | ORAL | Status: DC
  Administered 2012-02-13 – 2012-02-16 (×4): 25 mg via ORAL
  Filled 2012-02-12 (×5): qty 1

## 2012-02-12 MED ORDER — NITROGLYCERIN 0.4 MG SL SUBL: 0.4000 mg | SUBLINGUAL_TABLET | SUBLINGUAL | Status: DC | PRN

## 2012-02-12 MED ORDER — FENTANYL CITRATE 0.05 MG/ML IJ SOLN
50.0000 ug | Freq: Once | INTRAMUSCULAR | Status: AC
  Administered 2012-02-12: 50 ug via INTRAVENOUS
  Filled 2012-02-12: qty 2

## 2012-02-12 MED ORDER — ONDANSETRON HCL 4 MG/2ML IJ SOLN
4.0000 mg | Freq: Once | INTRAMUSCULAR | Status: AC
  Administered 2012-02-12: 4 mg via INTRAVENOUS
  Filled 2012-02-12: qty 2

## 2012-02-12 MED ORDER — HYDROMORPHONE HCL PF 1 MG/ML IJ SOLN
1.0000 mg | Freq: Once | INTRAMUSCULAR | Status: AC
  Administered 2012-02-12: 1 mg via INTRAVENOUS
  Filled 2012-02-12: qty 1

## 2012-02-12 MED ORDER — POTASSIUM CHLORIDE CRYS ER 10 MEQ PO TBCR
10.0000 meq | EXTENDED_RELEASE_TABLET | Freq: Every morning | ORAL | Status: DC
  Administered 2012-02-13 – 2012-02-16 (×4): 10 meq via ORAL
  Filled 2012-02-12 (×6): qty 1

## 2012-02-12 MED ORDER — ONDANSETRON HCL 4 MG/2ML IJ SOLN
4.0000 mg | Freq: Four times a day (QID) | INTRAMUSCULAR | Status: DC | PRN
  Administered 2012-02-12 (×2): 4 mg via INTRAVENOUS
  Filled 2012-02-12: qty 2

## 2012-02-12 MED ORDER — WARFARIN SODIUM 2.5 MG PO TABS: 2.5000 mg | ORAL_TABLET | Freq: Every day | ORAL | Status: DC

## 2012-02-12 MED ORDER — WARFARIN - PHYSICIAN DOSING INPATIENT
Freq: Every day | Status: DC
  Administered 2012-02-14 – 2012-02-15 (×2)

## 2012-02-12 MED ORDER — METHOCARBAMOL 500 MG PO TABS
500.0000 mg | ORAL_TABLET | Freq: Four times a day (QID) | ORAL | Status: DC | PRN
  Administered 2012-02-12 – 2012-02-16 (×7): 500 mg via ORAL
  Filled 2012-02-12 (×7): qty 1

## 2012-02-12 MED ORDER — ONDANSETRON HCL 4 MG PO TABS
4.0000 mg | ORAL_TABLET | Freq: Once | ORAL | Status: AC
  Administered 2012-02-12: 4 mg via ORAL
  Filled 2012-02-12: qty 1

## 2012-02-12 MED ORDER — HYDROMORPHONE HCL PF 1 MG/ML IJ SOLN: 0.5000 mg | INTRAMUSCULAR | Status: DC | PRN

## 2012-02-12 MED ORDER — SODIUM CHLORIDE 0.9 % IV SOLN
INTRAVENOUS | Status: DC
  Administered 2012-02-12: 1000 mL via INTRAVENOUS
  Administered 2012-02-13 – 2012-02-14 (×2): via INTRAVENOUS

## 2012-02-12 MED ORDER — ONDANSETRON HCL 4 MG/2ML IJ SOLN
4.0000 mg | Freq: Four times a day (QID) | INTRAMUSCULAR | Status: DC | PRN
  Filled 2012-02-12 (×2): qty 2

## 2012-02-12 MED ORDER — MORPHINE SULFATE 4 MG/ML IJ SOLN
4.0000 mg | INTRAMUSCULAR | Status: DC | PRN
  Administered 2012-02-12 – 2012-02-16 (×12): 4 mg via INTRAVENOUS
  Filled 2012-02-12 (×12): qty 1

## 2012-02-12 MED ORDER — PANTOPRAZOLE SODIUM 40 MG PO TBEC
40.0000 mg | DELAYED_RELEASE_TABLET | Freq: Every day | ORAL | Status: DC
  Administered 2012-02-13 – 2012-02-16 (×4): 40 mg via ORAL
  Filled 2012-02-12 (×5): qty 1

## 2012-02-12 MED ORDER — LISINOPRIL 10 MG PO TABS
10.0000 mg | ORAL_TABLET | Freq: Every day | ORAL | Status: DC
  Administered 2012-02-13 – 2012-02-16 (×3): 10 mg via ORAL
  Filled 2012-02-12 (×5): qty 1

## 2012-02-12 MED ORDER — WARFARIN SODIUM 2.5 MG PO TABS
2.5000 mg | ORAL_TABLET | Freq: Every day | ORAL | Status: DC
  Administered 2012-02-12 – 2012-02-15 (×4): 2.5 mg via ORAL
  Filled 2012-02-12 (×4): qty 1

## 2012-02-12 MED ORDER — HYDROMORPHONE HCL PF 1 MG/ML IJ SOLN
0.5000 mg | INTRAMUSCULAR | Status: DC | PRN
  Administered 2012-02-12: 0.5 mg via INTRAVENOUS
  Filled 2012-02-12: qty 1

## 2012-02-12 MED ORDER — PROBIOTIC PO CAPS
1.0000 | ORAL_CAPSULE | Freq: Every morning | ORAL | Status: DC
  Filled 2012-02-12 (×2): qty 1

## 2012-02-12 NOTE — H&P (Signed)
[name] is an 66 y.o. female.   Chief Complaint: right shoulder pain, bilateral ankle pain  HPI: 66 y.o. 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 Hypertension   . Diverticulitis   . Cyst on liver  . Diabetes mellitus   . Blood clot of artery under arm     Past Surgical History  Procedure Date  . Back surgery   . Cholecystectomy   . Knee arthrocentesis left  . Breast surgery right    cyst removed  . Abdominal hysterectomy   . Tubal ligation     Family History  Problem Relation Age of Onset  . Stroke Mother   . Cancer Sister   . Diabetes Sister   . Seizures Brother   . Diabetes Brother    Social History:  reports that she has never smoked. She has never used smokeless tobacco. She reports that she does not drink alcohol or use illicit drugs.  Allergies:  Allergies  Allergen Reactions  . Penicillins Hives    Medications Prior to Admission  Medication Sig Dispense Refill  . dicyclomine (BENTYL) 20 MG tablet Take 20 mg by mouth 3 (three) times daily as needed. For stomach pain      . hydrochlorothiazide (HYDRODIURIL) 25 MG tablet Take 25 mg by mouth daily.      . naproxen (NAPROSYN) 500 MG tablet Take 500 mg by mouth 2 (two) times daily with a meal.      . nitroGLYCERIN (NITROSTAT) 0.4 MG SL tablet Place 0.4 mg under the tongue every 5 (five) minutes x 3 doses as needed.      [name] omeprazole (PRILOSEC) 20 MG capsule Take 20 mg by mouth every morning.       . potassium chloride (K-DUR) 10 MEQ tablet Take 10 mEq by mouth every morning.      [name] PROBIOTIC CAPS Take 1 capsule by mouth every morning.       . warfarin (COUMADIN) 5 MG tablet Take 2.5-5 mg by mouth See admin instructions. Take 1/2 tablet (2.5 mg) on tuesdays,thursdays and sundays.  Take 1  tablet (5mg ) on all other days        Results for orders placed during the hospital encounter of 02/12/12 (from the past 48 hour(s))  PROTIME-INR     Status: Abnormal   Collection Time   02/12/12 11:23 AM      Component Value Range Comment   Prothrombin Time 22.8 (*) 11.6 - 15.2 seconds    INR 1.97 (*) 0.00 - 1.49   CBC     Status: Abnormal   Collection Time   02/12/12 11:23 AM      Component Value Range Comment   WBC 7.2  4.0 - 10.5 K/uL    RBC 5.00  3.87 - 5.11 MIL/uL    Hemoglobin 12.4  12.0 - 15.0 g/dL    HCT 30.8  65.7 - 84.6 %    MCV 76.6 (*) 78.0 - 100.0 fL    MCH 24.8 (*) 26.0 - 34.0 pg    MCHC 32.4  30.0 - 36.0 g/dL    RDW 96.2 (*) 95.2 - 15.5 %    Platelets 313  150 - 400 K/uL   DIFFERENTIAL     Status: Normal  Collection Time   02/12/12 11:23 AM      Component Value Range Comment   Neutrophils Relative 60  43 - 77 %    Neutro Abs 4.3  1.7 - 7.7 K/uL    Lymphocytes Relative 30  12 - 46 %    Lymphs Abs 2.2  0.7 - 4.0 K/uL    Monocytes Relative 7  3 - 12 %    Monocytes Absolute 0.5  0.1 - 1.0 K/uL    Eosinophils Relative 3  0 - 5 %    Eosinophils Absolute 0.2  0.0 - 0.7 K/uL    Basophils Relative 0  0 - 1 %    Basophils Absolute 0.0  0.0 - 0.1 K/uL   BASIC METABOLIC PANEL     Status: Abnormal   Collection Time   02/12/12 11:23 AM      Component Value Range Comment   Sodium 137  135 - 145 mEq/L    Potassium 4.0  3.5 - 5.1 mEq/L    Chloride 100  96 - 112 mEq/L    CO2 28  19 - 32 mEq/L    Glucose, Bld 116 (*) 70 - 99 mg/dL    BUN 17  6 - 23 mg/dL    Creatinine, Ser 4.54  0.50 - 1.10 mg/dL    Calcium 9.2  8.4 - 09.8 mg/dL    GFR calc non Af Amer 90 (*) >90 mL/min    GFR calc Af Amer >90  >90 mL/min    Dg Pelvis 1-2 Views  02/12/2012  *RADIOLOGY REPORT*  Clinical Data: Fall  PELVIS - 1-2 VIEW  Comparison: CT abdomen pelvis dated 03/13/2010  Findings: No fracture or dislocation is seen.  Degenerative changes/joint space narrowing of the bilateral hips, right  greater than left.  Visualized bony pelvis appears intact.  IMPRESSION: No fracture or dislocation is seen.  Original Report Authenticated By: Charline Bills, M.D.   Dg Shoulder Right  02/12/2012  *RADIOLOGY REPORT*  Clinical Data: Fall, arm pain  RIGHT SHOULDER - 2+ VIEW  Comparison: None.  Findings: No fracture or dislocation is seen.  Degenerative changes.  Subacromial spurring.  The visualized soft tissues are unremarkable.  Visualized right lung is essentially clear.  IMPRESSION: No fracture or dislocation is seen.  Original Report Authenticated By: Charline Bills, M.D.   Dg Tibia/fibula Left  02/12/2012  *RADIOLOGY REPORT*  Clinical Data: Fall  LEFT TIBIA AND FIBULA - 2 VIEW  Comparison: None.  Findings: Please refer to ankle radiographs for evaluation of the distal tibia / fibula.  No fracture or dislocation is seen.  Mild degenerative changes of the knee.  Mild soft tissue swelling overlying the distal fibula.  IMPRESSION: No fracture or dislocation is seen in the proximal/mid tibia and fibula.  Original Report Authenticated By: Charline Bills, M.D.   Dg Tibia/fibula Right  02/12/2012  *RADIOLOGY REPORT*  Clinical Data: Fall, ankle pain  RIGHT TIBIA AND FIBULA - 2 VIEW  Comparison: None.  Findings: Please refer to ankle radiographs for evaluation of the distal tibia / fibula.  No fracture or dislocation is seen.  Mild degenerative changes of the knee joint.  The visualized soft tissues are unremarkable.  IMPRESSION: No fracture or dislocation is seen in the proximal/mid tibia and fibula.  Original Report Authenticated By: Charline Bills, M.D.   Dg Ankle Complete Left  02/12/2012  *RADIOLOGY REPORT*  Clinical Data: Fall, bilateral ankle pain/swelling  LEFT ANKLE COMPLETE - 3+ VIEW  Comparison: None.  Findings:  Mild irregularity involving the distal aspect of the medial and lateral malleoli, age indeterminate, tiny avulsion fractures not excluded.  The ankle mortise is otherwise intact.   The base of the fifth metatarsal is unremarkable.  Moderate soft tissue swelling, most prominent laterally.  Plantar calcaneal enthesophyte.  IMPRESSION: Mild irregularity involving the distal aspect of the medial and lateral malleoli, age indeterminate, tiny avulsion fractures not excluded.  Correlate with point tenderness.  Moderate soft tissue swelling, most prominent laterally.  Original Report Authenticated By: Charline Bills, M.D.   Dg Ankle Complete Right  02/12/2012  *RADIOLOGY REPORT*  Clinical Data: Fall, ankle pain  RIGHT ANKLE - COMPLETE 3+ VIEW  Comparison: None.  Findings: Mildly displaced oblique distal fibular fracture.  The ankle mortise is otherwise intact.  The base of the fifth metatarsal is unremarkable.  Plantar and posterior calcaneal enthesophytes.  Small to moderate soft tissue swelling, most prominent laterally.  IMPRESSION: Mildly displaced oblique distal fibular fracture.  Small to moderate soft tissue swelling, most prominent laterally.  Original Report Authenticated By: Charline Bills, M.D.    Review of Systems  Constitutional: Negative.   HENT: Negative.   Eyes: Negative.   Respiratory: Negative.   Cardiovascular: Negative.   Gastrointestinal: Negative.   Genitourinary: Negative.   Musculoskeletal: Positive for myalgias, joint pain and falls.  Skin: Negative.   Neurological: Negative.   Endo/Heme/Allergies: Bruises/bleeds easily.  Psychiatric/Behavioral: Negative.     Blood pressure 132/75, pulse 55, temperature 97.7 F (36.5 C), temperature source Oral, resp. rate 20, height 5\' 6"  (1.676 m), weight 108.8 kg (239 lb 13.8 oz), SpO2 97.00%. Physical Exam  Musculoskeletal:       Vital signs are stable as recorded  General appearance is normal  The patient is alert and oriented x3  The patient's mood and affect are normal  Gait assessment: can not bear weight either leg  The cardiovascular exam reveals normal pulses and temperature without edema  swelling.  The lymphatic system is negative for palpable lymph nodes  The sensory exam is normal.  There are no pathologic reflexes.  Balance is normal.   Exam of the right upper extremity shows swelling from the clot slight decreased range of motion. Stability normal strength normal skin intact no specific tenderness  Right lower extremity is in a splint  Left lower extremity is in an Aircast the neurovascular exam is intact  Left upper extremity normal     Assessment/Plan X-rays are reviewed including a pelvis tib-fib and ankle x-rays shoulder x-rays  Positive findings include a right Weber B. stable ankle fracture fibula only and a left bilateral malleolar avulsion fracture of the ankle which is essentially a bad ankle sprain  Recommend elevation ice and then walking boot for the left leg/ankle in a cast when swelling goes down for the right leg. Continue Coumadin.  Physical therapy consult  And Robaxin for muscle spasms  Fuller Canada 02/12/2012, 4:45 PM

## 2012-02-12 NOTE — Progress Notes (Signed)
Patient ID: [name], female   DOB: 1945/11/06, 66 y.o.   MRN: [id number] Chart and xrays reviewd  No surgery planned   Will be in later today to complete admission

## 2012-02-12 NOTE — ED Provider Notes (Addendum)
History     CSN: [id number]  Arrival date & time 02/12/12  1002   First MD Initiated Contact with Patient 02/12/12 1101      Chief Complaint  Patient presents with  . Fall  . Ankle Pain  . Arm Pain    (Consider location/radiation/quality/duration/timing/severity/associated sxs/prior treatment) HPI Comments: Patient presents to the emergency department by the MS after sustaining a fall down porch steps. The patient states that time she hit her knee, but is basically having pain of both of her ankles her and her right arm. It is of note that the patient has a history of a blood clot in the right arm in the past and she is currently on Coumadin for this. The patient required fentanyl IV for pain which she received by EMS. Patient denies hitting her head she denies any loss of consciousness. She denies any rib or chest area pain.  The history is provided by the patient.    Past Medical History  Diagnosis Date  . Hypertension   . Diverticulitis   . Cyst on liver  . Diabetes mellitus   . Blood clot of artery under arm     Past Surgical History  Procedure Date  . Back surgery   . Cholecystectomy   . Knee arthrocentesis left  . Breast surgery right    cyst removed  . Abdominal hysterectomy   . Tubal ligation     Family History  Problem Relation Age of Onset  . Stroke Mother   . Cancer Sister   . Diabetes Sister   . Seizures Brother   . Diabetes Brother     History  Substance Use Topics  . Smoking status: Never Smoker   . Smokeless tobacco: Never Used  . Alcohol Use: No    OB History    Grav Para Term Preterm Abortions TAB SAB Ect Mult Living   3 3 3       3       Review of Systems  Constitutional: Negative for activity change.       All ROS Neg except as noted in HPI  HENT: Negative for nosebleeds and neck pain.   Eyes: Negative for photophobia and discharge.  Respiratory: Negative for cough, shortness of breath and wheezing.   Cardiovascular: Negative for  chest pain and palpitations.  Gastrointestinal: Negative for abdominal pain and blood in stool.  Genitourinary: Negative for dysuria, frequency and hematuria.  Musculoskeletal: Positive for back pain and arthralgias.  Skin: Negative.   Neurological: Negative for dizziness, seizures and speech difficulty.  Psychiatric/Behavioral: Negative for hallucinations and confusion.    Allergies  Penicillins  Home Medications   Current Outpatient Rx  Name Route Sig Dispense Refill  . ASPIRIN 81 MG PO TBEC Oral Take 1 tablet (81 mg total) by mouth daily. 30 tablet 0  . DICYCLOMINE HCL 20 MG PO TABS Oral Take 20 mg by mouth 3 (three) times daily.      [name] HYDROCHLOROTHIAZIDE 12.5 MG PO CAPS Oral Take 2 capsules (25 mg total) by mouth daily. 60 capsule 6  . LISINOPRIL 10 MG PO TABS Oral Take 1 tablet (10 mg total) by mouth daily. 30 tablet 6  . NITROGLYCERIN 0.4 MG SL SUBL Sublingual Place 1 tablet (0.4 mg total) under the tongue every 5 (five) minutes as needed for chest pain. 25 tablet 6  . OMEPRAZOLE 20 MG PO CPDR Oral Take 20 mg by mouth daily.      [name] PROBIOTIC PO CAPS Oral  Take 1 capsule by mouth daily.        BP 139/50  Pulse 67  Temp 98.1 F (36.7 C) (Oral)  Resp 20  Ht 5\' 6"  (1.676 m)  Wt 240 lb (108.863 kg)  BMI 38.74 kg/m2  SpO2 98%  Physical Exam  Nursing note and vitals reviewed. Constitutional: She is oriented to person, place, and time. She appears well-developed and well-nourished.  Non-toxic appearance.  HENT:  Head: Normocephalic.  Right Ear: Tympanic membrane and external ear normal.  Left Ear: Tympanic membrane and external ear normal.  Eyes: EOM and lids are normal. Pupils are equal, round, and reactive to light.  Neck: Normal range of motion. Neck supple. Carotid bruit is not present.  Cardiovascular: Normal rate, regular rhythm, normal heart sounds, intact distal pulses and normal pulses.   Pulmonary/Chest: Breath sounds normal. No respiratory distress.  Abdominal:  Soft. Bowel sounds are normal. There is no tenderness. There is no guarding.  Musculoskeletal: Normal range of motion.       Pain and swelling of the right and left ankle area. Distal pulse wnl. Splints in place. Good cap refill. No broken skin areas.  Lymphadenopathy:       Head (right side): No submandibular adenopathy present.       Head (left side): No submandibular adenopathy present.    She has no cervical adenopathy.  Neurological: She is alert and oriented to person, place, and time. She has normal strength. No cranial nerve deficit or sensory deficit.  Skin: Skin is warm and dry.  Psychiatric: She has a normal mood and affect. Her speech is normal.    ED Course  Procedures (including critical care time)  Labs Reviewed  PROTIME-INR - Abnormal; Notable for the following:    Prothrombin Time 22.8 (*)     INR 1.97 (*)     All other components within normal limits  CBC - Abnormal; Notable for the following:    MCV 76.6 (*)     MCH 24.8 (*)     RDW 16.3 (*)     All other components within normal limits  BASIC METABOLIC PANEL - Abnormal; Notable for the following:    Glucose, Bld 116 (*)     GFR calc non Af Amer 90 (*)     All other components within normal limits  DIFFERENTIAL   Dg Pelvis 1-2 Views  02/12/2012  *RADIOLOGY REPORT*  Clinical Data: Fall  PELVIS - 1-2 VIEW  Comparison: CT abdomen pelvis dated 03/13/2010  Findings: No fracture or dislocation is seen.  Degenerative changes/joint space narrowing of the bilateral hips, right greater than left.  Visualized bony pelvis appears intact.  IMPRESSION: No fracture or dislocation is seen.  Original Report Authenticated By: Charline Bills, M.D.   Dg Shoulder Right  02/12/2012  *RADIOLOGY REPORT*  Clinical Data: Fall, arm pain  RIGHT SHOULDER - 2+ VIEW  Comparison: None.  Findings: No fracture or dislocation is seen.  Degenerative changes.  Subacromial spurring.  The visualized soft tissues are unremarkable.  Visualized right  lung is essentially clear.  IMPRESSION: No fracture or dislocation is seen.  Original Report Authenticated By: Charline Bills, M.D.   Dg Tibia/fibula Left  02/12/2012  *RADIOLOGY REPORT*  Clinical Data: Fall  LEFT TIBIA AND FIBULA - 2 VIEW  Comparison: None.  Findings: Please refer to ankle radiographs for evaluation of the distal tibia / fibula.  No fracture or dislocation is seen.  Mild degenerative changes of the knee.  Mild soft tissue  swelling overlying the distal fibula.  IMPRESSION: No fracture or dislocation is seen in the proximal/mid tibia and fibula.  Original Report Authenticated By: Charline Bills, M.D.   Dg Tibia/fibula Right  02/12/2012  *RADIOLOGY REPORT*  Clinical Data: Fall, ankle pain  RIGHT TIBIA AND FIBULA - 2 VIEW  Comparison: None.  Findings: Please refer to ankle radiographs for evaluation of the distal tibia / fibula.  No fracture or dislocation is seen.  Mild degenerative changes of the knee joint.  The visualized soft tissues are unremarkable.  IMPRESSION: No fracture or dislocation is seen in the proximal/mid tibia and fibula.  Original Report Authenticated By: Charline Bills, M.D.   Dg Ankle Complete Left  02/12/2012  *RADIOLOGY REPORT*  Clinical Data: Fall, bilateral ankle pain/swelling  LEFT ANKLE COMPLETE - 3+ VIEW  Comparison: None.  Findings: Mild irregularity involving the distal aspect of the medial and lateral malleoli, age indeterminate, tiny avulsion fractures not excluded.  The ankle mortise is otherwise intact.  The base of the fifth metatarsal is unremarkable.  Moderate soft tissue swelling, most prominent laterally.  Plantar calcaneal enthesophyte.  IMPRESSION: Mild irregularity involving the distal aspect of the medial and lateral malleoli, age indeterminate, tiny avulsion fractures not excluded.  Correlate with point tenderness.  Moderate soft tissue swelling, most prominent laterally.  Original Report Authenticated By: Charline Bills, M.D.   Dg Ankle  Complete Right  02/12/2012  *RADIOLOGY REPORT*  Clinical Data: Fall, ankle pain  RIGHT ANKLE - COMPLETE 3+ VIEW  Comparison: None.  Findings: Mildly displaced oblique distal fibular fracture.  The ankle mortise is otherwise intact.  The base of the fifth metatarsal is unremarkable.  Plantar and posterior calcaneal enthesophytes.  Small to moderate soft tissue swelling, most prominent laterally.  IMPRESSION: Mildly displaced oblique distal fibular fracture.  Small to moderate soft tissue swelling, most prominent laterally.  Original Report Authenticated By: Charline Bills, M.D.     No diagnosis found.    MDM  I have reviewed nursing notes, vital signs, and all appropriate lab and imaging results for this patient. Pt was brought to ED by EMS with c/o bilat ankle pain following a fall. Pt fell on porch steps. She is on coumadin. INR 2.12. Hgb and Hct non critical. Pt has a mildly displaced rt  fibular fx. No pelvis fracture.  Left ankle xray reveals mild irregularity of the distal medial and latera malleoli. Pt to be admitted by orthopedics.     Kathie Dike, PA 03/04/12 2320  Kathie Dike, [place] 04/10/12 1718

## 2012-02-12 NOTE — ED Notes (Addendum)
Patient brought in via EMS. Alert and oriented. Airway patent. Patient c/o bilaterally ankle and knee pain, and right arm pain from fall trying to step down from porch. Per patient hit knees with fall and hurt right arm trying to catch self. Patient reports hx of blood clot in right arm. Denies hitting head or LOC. Patient given of fentanyl IV by EMS.

## 2012-02-13 LAB — PROTIME-INR
INR: 2.43 — ABNORMAL HIGH (ref 0.00–1.49)
Prothrombin Time: 26.8 seconds — ABNORMAL HIGH (ref 11.6–15.2)

## 2012-02-13 LAB — CBC
HCT: 35.1 % — ABNORMAL LOW (ref 36.0–46.0)
Platelets: 272 10*3/uL (ref 150–400)
RDW: 16.3 % — ABNORMAL HIGH (ref 11.5–15.5)
WBC: 6.9 10*3/uL (ref 4.0–10.5)

## 2012-02-13 MED ORDER — DICYCLOMINE HCL 10 MG PO CAPS: 20.0000 mg | ORAL_CAPSULE | Freq: Three times a day (TID) | ORAL | Status: DC | PRN

## 2012-02-13 MED ORDER — FLORA-Q PO CAPS
1.0000 | ORAL_CAPSULE | Freq: Every day | ORAL | Status: DC
  Administered 2012-02-13 – 2012-02-16 (×4): 1 via ORAL
  Filled 2012-02-13 (×5): qty 1

## 2012-02-13 NOTE — Progress Notes (Signed)
Subjective: C/o pain  Cramps better Nausea better    Objective: Vital signs in last 24 hours: Temp:  [97.7 F (36.5 C)-98.1 F (36.7 C)] 97.8 F (36.6 C) (06/16 0435) Pulse Rate:  [52-60] 52  (06/16 0435) Resp:  [20] 20  (06/16 0435) BP: (122-132)/(45-76) 128/76 mmHg (06/16 0435) SpO2:  [92 %-99 %] 99 % (06/16 0435) Weight:  [108.8 kg (239 lb 13.8 oz)] 108.8 kg (239 lb 13.8 oz) (06/15 1555)  Intake/Output from previous day: 06/15 0701 - 06/16 0700 In: 661.7 [I.V.:661.7] Out: 750 [Urine:750] Intake/Output this shift: Total I/O In: 120 [P.O.:120] Out: -    Basename 02/13/12 0902 02/12/12 1123  HGB 11.2* 12.4    Basename 02/13/12 0902 02/12/12 1123  WBC 6.9 7.2  RBC 4.53 5.00  HCT 35.1* 38.3  PLT 272 313    Basename 02/12/12 1123  NA 137  K 4.0  CL 100  CO2 28  BUN 17  CREATININE 0.71  GLUCOSE 116*  CALCIUM 9.2    Basename 02/13/12 0902 02/12/12 1123  LABPT -- --  INR 2.43* 1.97*    Neurologically intact Neurovascular intact Sensation intact distally Intact pulses distally Dorsiflexion/Plantar flexion intact  Assessment/Plan: Bilateral ankle fractures   Waiting PT eval    Fuller Canada 02/13/2012, 10:51 AM

## 2012-02-13 NOTE — Progress Notes (Signed)
Recommend cast application right ankl

## 2012-02-13 NOTE — Progress Notes (Signed)
Clinical Social Work Department BRIEF PSYCHOSOCIAL ASSESSMENT 02/13/2012  Patient:  [name], [name]     Account Number:  [id number]     Admit date:  02/12/2012  Clinical Social Worker:  Candie Chroman  Date/Time:  02/13/2012 03:20 PM  Referred by:  Physician  Date Referred:  02/13/2012 Referred for  SNF Placement   Other Referral:   Interview type:  Patient Other interview type:    PSYCHOSOCIAL DATA Living Status:  FAMILY Admitted from facility:   Level of care:   Primary support name:  Calliope Delangel Primary support relationship to patient:  SPOUSE Degree of support available:   available during the day ( works night shift )    CURRENT CONCERNS Current Concerns  Post-Acute Placement   Other Concerns:   Financial concerns r/t no health insurance.    SOCIAL WORK ASSESSMENT / PLAN Pt ia a 66 yr old female living at home prior to hospitalization. Met with pt to assist with d/c planning. Pt fell and has a R ankle fx and L ankle sprain. PT eval is pending. SNF placement may be needed. Pt is willing to consider ST rehab if recommended. Pt does have support at home. Spouse works nights and is available during the day to assist pt. Pt's son is home with her at night. Pt has no health insurance to cover cost of SNF placement. If placement is recommended by PT CSW will request LOG from Director of CSW to assist with placement cost.   Assessment/plan status:  Psychosocial Support/Ongoing Assessment of Needs Other assessment/ plan:   Home with [facility] services and 24/7 family support.   Information/referral to community resources:   D/C planning options reviewed. SNF list to be provided if placement needed.    PATIENT'S/FAMILY'S RESPONSE TO PLAN OF CARE: Pt willing to consider all d/c options.   Cori Razor LCSW [phone]

## 2012-02-14 ENCOUNTER — Encounter (HOSPITAL_COMMUNITY): Payer: Self-pay | Admitting: *Deleted

## 2012-02-14 ENCOUNTER — Encounter (HOSPITAL_COMMUNITY)
Admission: EM | Disposition: A | Discharge status: Home / Self Care | Payer: Self-pay | Source: Home / Self Care | Attending: Orthopedic Surgery

## 2012-02-14 HISTORY — PX: CAST APPLICATION: SHX380

## 2012-02-14 LAB — SURGICAL PCR SCREEN
MRSA, PCR: NEGATIVE
Staphylococcus aureus: NEGATIVE

## 2012-02-14 LAB — PROTIME-INR
INR: 2.54 — ABNORMAL HIGH (ref 0.00–1.49)
Prothrombin Time: 27.8 seconds — ABNORMAL HIGH (ref 11.6–15.2)

## 2012-02-14 LAB — CBC
Hemoglobin: 10.6 g/dL — ABNORMAL LOW (ref 12.0–15.0)
RBC: 4.27 MIL/uL (ref 3.87–5.11)
WBC: 5.8 10*3/uL (ref 4.0–10.5)

## 2012-02-14 SURGERY — APPLICATION, CAST
Anesthesia: LOCAL | Site: Ankle | Laterality: Right

## 2012-02-14 SURGICAL SUPPLY — 12 items
CLOTH BEACON ORANGE TIMEOUT ST (SAFETY) IMPLANT
PAD CAST 4YDX4 CTTN HI CHSV (CAST SUPPLIES) IMPLANT
PADDING CAST ABS 3INX4YD NS (CAST SUPPLIES) ×2
PADDING CAST ABS COTTON 3X4 (CAST SUPPLIES) ×2 IMPLANT
PADDING CAST COTTON 4X4 STRL (CAST SUPPLIES)
PADDING CAST COTTON 6X4 STRL (CAST SUPPLIES) IMPLANT
STOCKINETTE TUBE COTTON 3IN (GAUZE/BANDAGES/DRESSINGS) IMPLANT
STOCKINETTE TUBULAR 6 INCH (GAUZE/BANDAGES/DRESSINGS) IMPLANT
STOCKINETTE TUBULAR COTT 4X25 (GAUZE/BANDAGES/DRESSINGS) ×2 IMPLANT
STOCKINETTE TUBULAR COTTON 3IN (GAUZE/BANDAGES/DRESSINGS)
TAPE CAST 4X4 WHT DELT L NS LF (CAST SUPPLIES) ×4 IMPLANT
WATER STERILE IRR 1000ML POUR (IV SOLUTION) ×4 IMPLANT

## 2012-02-14 NOTE — Procedures (Signed)
Cast applied left ankle   SLNWB   No complications

## 2012-02-14 NOTE — Care Management Note (Signed)
    Page 1 of 2   02/16/2012     10:46:07 AM   CARE MANAGEMENT NOTE 02/16/2012  Patient:  [name], [name]   Account Number:  [id number]  Date Initiated:  02/14/2012  Documentation initiated by:  Sharrie Rothman  Subjective/Objective Assessment:   Pt admitted from home with bilateral ankle fractures. Pt lives with husband and there is a son who lives in the home also. Pt would like to go to SNF if possible.     Action/Plan:   CSW has spoken to pt about SNF. Pt is self pay and understands that it may not be possible. CM offered HH and DME assistance if pt goes home. Will continue to follow.   Anticipated DC Date:  02/15/2012   Anticipated DC Plan:  HOME W HOME HEALTH SERVICES  In-house referral  Clinical Social Worker  Artist      DC Planning Services  CM consult      [facility] Choice  HOME HEALTH  DURABLE MEDICAL EQUIPMENT   Choice offered to / List presented to:  C-1 Patient   DME arranged  BEDSIDE COMMODE  WHEELCHAIR - MANUAL  OTHER - SEE COMMENT      DME agency  [facility]     HH arranged  HH-2 PT      [facility] agency  [facility]   Status of service:  Completed, signed off Medicare Important Message given?   (If response is "NO", the following Medicare IM given date fields will be blank) Date Medicare IM given:   Date Additional Medicare IM given:    Discharge Disposition:  HOME W HOME HEALTH SERVICES  Per UR Regulation:    If discussed at Long Length of Stay Meetings, dates discussed:    Comments:  02/16/12 1038 Arlyss Queen, RN BSN CM Pt discharged home with [facility] PT and DME BSC, wheelchair, and hospital bed. Alroy Bailiff of [facility] is aware of pt discharge and per Crissie Reese of [facility] wheelchair will be delivered to the pts room prior to discharge. Other equipment will be delivered to pts home. CSW is checking prices for EMS transport vs. [facility] transport.  02/15/12 1400 Arlyss Queen, RN BSN CM Pt has been arranged  with [facility] for PT and DME. Alroy Bailiff of [facility] is aware and will collect the pts information from the chart. Alroy Bailiff made pt and family aware of cost for therapy and DME since pt is self pay.   02/14/12 1124 Arlyss Queen, RN BSN CM

## 2012-02-14 NOTE — OR Nursing (Signed)
Report called to Lyda Jester RN.  Pt. Transported back to 307 via her bed

## 2012-02-14 NOTE — Clinical Social Work Psychosocial (Signed)
     Clinical Social Work Department BRIEF PSYCHOSOCIAL ASSESSMENT 02/14/2012  Patient:  [name], [name]     Account Number:  [id number]     Admit date:  02/12/2012  Clinical Social Worker:  [name]  Date/Time:  02/13/2012 03:20 PM  Referred by:  Physician  Date Referred:  02/13/2012 Referred for  SNF Placement   Other Referral:   Interview type:  Patient Other interview type:    PSYCHOSOCIAL DATA Living Status:  FAMILY Admitted from facility:   Level of care:   Primary support name:  [name] Primary support relationship to patient:  SPOUSE Degree of support available:   availble during the day ( works night shift ).  Son and daughter are also available to help w patient at night. States she has many famliy members who will assist.    CURRENT CONCERNS Current Concerns  Post-Acute Placement   Other Concerns:   Financial concerns r/t no health insurance.    SOCIAL WORK ASSESSMENT / PLAN Pt ia a 66 yr old female living at home prior to hospitalization. Met with pt to assist with d/c planning. Pt fell and has a R ankle fx and L ankle sprain. PT eval is pending. SNF placement may be needed. Pt is willing to consider ST rehab if recommended. Pt does have support at home. Spouse works nights and is available during the day to assist pt. Pt's son is home with her at night. Pt has no health insurance to cover cost of SNF placement. If placement is recommended by PT CSW will request LOG from Director of CSW.    Spoke w [name] and asked her to assess status of patient re: Medicaid.  [name] says she has previously advised patient to apply for Medicaid, but patient did not follow through.  Patient states that she was denied to to husband's income.  [name] asked to assess income and assets and call CSW [name] w feasibility of Medicaid application.    Also spoke w [name] re:  LOG.  [name] stated that we could assist w LOG if all family resources have  been exhausted after patient pays for SNF care via her own resources.    PT very concerned about patient being able to care for self at home w family.  States that she is unable to transfer from bed to chair safely, and that she must use bedpan for toileting.   Assessment/plan status:  Psychosocial Support/Ongoing Assessment of Needs Other assessment/ plan:   Home with HH services and 24/7 family support vs SNF placement   Information/referral to community resources:   D/C planning options reviewed. SNF list to be provided if placement needed.    Family given list of SNFs in [place] and [place]. Advised that if LOG is offered, patient will need to select facility willing to accept LOG.    PATIENTS/FAMILYS RESPONSE TO PLAN OF CARE: Pt willing to consider all d/c options.

## 2012-02-14 NOTE — Progress Notes (Signed)
UR chart review completed.  

## 2012-02-14 NOTE — Progress Notes (Signed)
Physical Therapy Treatment Patient Details Name: [name] MRN: [id number] DOB: 06/22/1946 Today's Date: 02/14/2012 Time: 4098-1191 PT Time Calculation (min): 31 min  PT Assessment / Plan / Recommendation Comments on Treatment Session  Pt with less pain this afternoon and seems to feel more comfortable moving her LEs.  Transfer bed to chair with a sliding board is still very labored, but with repetition it became slightly less effortful.  Her obesity is a major obstacle for her as well as underlying deconditioning (she is very sedentary at home due to back dysfunction and spends a lot of time in bed).  I continue to recommend SNF at d.c for this pt.    Follow Up Recommendations  Skilled nursing facility    Barriers to Discharge None      Equipment Recommendations  Defer to next venue    Recommendations for Other Services    Frequency 7X/week   Plan Discharge plan remains appropriate;Frequency remains appropriate    Precautions / Restrictions Precautions Precautions: Fall Required Braces or Orthoses: Other Brace/Splint Other Brace/Splint: CAM walker LLE Restrictions Weight Bearing Restrictions: Yes RLE Weight Bearing: Non weight bearing LLE Weight Bearing: Weight bearing as tolerated   Pertinent Vitals/Pain     Mobility  Bed Mobility Bed Mobility: Supine to Sit;Sit to Supine Supine to Sit: 5: Supervision;HOB elevated (HOB at 30 deg) Sit to Supine: 6: Modified independent (Device/Increase time) Details for Bed Mobility Assistance: instruction needed for using hip and knee musculature to move LEs in bed Transfers Transfers: Lateral/Scoot Transfers (use of sliding board) Sit to Stand: Not tested (comment) Stand to Sit: Not tested (comment) Stand Pivot Transfers: Not tested (comment) Squat Pivot Transfers: Not tested (comment) Lateral/Scoot Transfers: 4: Min guard;With armrests removed Details for Transfer Assistance: transfer is still very labored, but with  repetion it bacame a bit less effortful Ambulation/Gait Ambulation/Gait Assistance: Not tested (comment) (unable)    Exercises     PT Diagnosis: Acute pain (difficulty with transfers)  PT Problem List: Decreased mobility;Decreased activity tolerance;Decreased knowledge of use of DME;Decreased safety awareness;Obesity;Pain PT Treatment Interventions: DME instruction;Functional mobility training;Patient/family education   PT Goals Acute Rehab PT Goals PT Goal Formulation: With patient Time For Goal Achievement: 02/21/12 Potential to Achieve Goals: Good Pt will go Supine/Side to Sit: with supervision;with HOB 0 degrees PT Goal: Supine/Side to Sit - Progress: Progressing toward goal Pt will Transfer Bed to Chair/Chair to Bed: with modified independence;Other (comment) (with sliding board) PT Transfer Goal: Bed to Chair/Chair to Bed - Progress: Progressing toward goal  Visit Information  Last PT Received On: 02/14/12    Subjective Data  Subjective: I'm feeling better Patient Stated Goal: none stated   Cognition  Overall Cognitive Status: Appears within functional limits for tasks assessed/performed Arousal/Alertness: Awake/alert Orientation Level: Appears intact for tasks assessed Behavior During Session: [facility] for tasks performed    Balance  Balance Balance Assessed: No  End of Session PT - End of Session Equipment Utilized During Treatment: Gait belt Activity Tolerance: Patient tolerated treatment well Patient left: in bed;with call bell/phone within reach Nurse Communication: Mobility status    Konrad Penta 02/14/2012, 3:49 PM

## 2012-02-14 NOTE — Progress Notes (Signed)
Appr. 200cc bright yellow urine in cath bag

## 2012-02-14 NOTE — Evaluation (Signed)
Physical Therapy Evaluation Patient Details Name: [name] MRN: [id number] DOB: Aug 03, 1946 Today's Date: 02/14/2012 Time: 4098-1191 PT Time Calculation (min): 91 min  PT Assessment / Plan / Recommendation Clinical Impression  Pt is alert and cooperative, mobility severely compromised by bilateral ankle fx.  Cam walker was fitted to LLE.  She was instructed in bed mobility and able to tolerate sitting at EOB.  However, she cannot tolerate any weight on LLE in order to try to stand.  We opted to instruct in lateral scoot to w/c which she was able to complete from bed to w/c but had to have a sliding board to move from w/c to bed.  This transfer is so laborious that it would not be functional for getting to a BSC.  I strongly recommend SNF at d/c.                             PT Assessment  Patient needs continued PT services    Follow Up Recommendations  Skilled nursing facility    Barriers to Discharge None      lEquipment Recommendations  Defer to next venue    Recommendations for Other Services     Frequency 7X/week    Precautions / Restrictions Precautions Precautions: Fall Required Braces or Orthoses: Other Brace/Splint Other Brace/Splint: CAM walker LLE Restrictions Weight Bearing Restrictions: Yes RLE Weight Bearing: Non weight bearing LLE Weight Bearing: Weight bearing as tolerated   Pertinent Vitals/Pain       Mobility  Bed Mobility Bed Mobility: Supine to Sit;Sit to Supine Supine to Sit: 4: Min guard;HOB elevated Sit to Supine: 4: Min guard;HOB elevated Details for Bed Mobility Assistance: instruction needed for using hip and knee musculature to move LEs in bed Transfers Transfers: Lateral/Scoot Transfers (use of sliding board) Stand to Sit: Not tested (comment) (unable to tolerate weight on LLE) Squat Pivot Transfers: Not tested (comment) Lateral/Scoot Transfers: 4: Min guard;With slide board;With armrests removed (extremely labored and slow  transfer) Ambulation/Gait Ambulation/Gait Assistance: Not tested (comment) (unable)    Exercises     PT Diagnosis: Acute pain (difficulty with transfers)  PT Problem List: Decreased mobility;Decreased activity tolerance;Decreased knowledge of use of DME;Decreased safety awareness;Obesity;Pain PT Treatment Interventions: DME instruction;Functional mobility training;Patient/family education   PT Goals Acute Rehab PT Goals PT Goal Formulation: With patient Time For Goal Achievement: 02/21/12 Potential to Achieve Goals: Good Pt will go Supine/Side to Sit: with supervision;with HOB 0 degrees PT Goal: Supine/Side to Sit - Progress: Goal set today Pt will Transfer Bed to Chair/Chair to Bed: with modified independence;Other (comment) (with sliding board) PT Transfer Goal: Bed to Chair/Chair to Bed - Progress: Goal set today  Visit Information  Last PT Received On: 02/14/12    Subjective Data  Subjective: my  ankles hurt Patient Stated Goal: none stated   Prior Functioning  Home Living Lives With: Spouse Available Help at Discharge: Family Type of Home: House Home Access: Level entry Home Layout: One level Firefighter: Standard Home Adaptive Equipment: None Prior Function Level of Independence: Independent Able to Take Stairs?: Yes Driving: Yes Communication Communication: No difficulties    Cognition  Overall Cognitive Status: Appears within functional limits for tasks assessed/performed Arousal/Alertness: Awake/alert Orientation Level: Appears intact for tasks assessed Behavior During Session: [facility] for tasks performed    Extremity/Trunk Assessment Right Upper Extremity Assessment RUE ROM/Strength/Tone: [facility] for tasks assessed Left Upper Extremity Assessment LUE ROM/Strength/Tone: [facility] for tasks assessed Right Lower  Extremity Assessment RLE ROM/Strength/Tone: Unable to fully assess;Due to pain Left Lower Extremity Assessment LLE ROM/Strength/Tone: Unable to fully  assess;Due to pain   Balance Balance Balance Assessed: No  End of Session PT - End of Session Equipment Utilized During Treatment: Gait belt Activity Tolerance: Patient limited by fatigue;Patient limited by pain Patient left: in bed;with call bell/phone within reach;with family/visitor present Nurse Communication: Mobility status   Konrad Penta 02/14/2012, 12:21 PM

## 2012-02-14 NOTE — Clinical Social Work Placement (Signed)
Clinical Social Work Department CLINICAL SOCIAL WORK PLACEMENT NOTE 02/14/2012  Patient:  [name], [name]  Account Number:  [id number] Admit date:  02/12/2012  Clinical Social Worker:  Sherrlyn Hock  Date/time:  02/14/2012 03:30 PM  Clinical Social Work is seeking post-discharge placement for this patient at the following level of care:   SKILLED NURSING   (*CSW will update this form in Epic as items are completed)   02/14/2012  Patient/family provided with [facility] Department of Clinical Social Work's list of facilities offering this level of care within the geographic area requested by the patient (or if unable, by the patient's family).  02/14/2012  Patient/family informed of their freedom to choose among providers that offer the needed level of care, that participate in Medicare, Medicaid or managed care program needed by the patient, have an available bed and are willing to accept the patient.  02/14/2012  Patient/family informed of [facility]' ownership interest in [facility], as well as of the fact that they are under no obligation to receive care at this facility.  PASARR submitted to EDS on 02/14/2012 PASARR number received from EDS on 02/14/2012  FL2 transmitted to all facilities in geographic area requested by pt/family on  02/14/2012 FL2 transmitted to all facilities within larger geographic area on 02/14/2012  Patient informed that his/her managed care company has contracts with or will negotiate with  certain facilities, including the following:   N/A PROBABLE LOG SUBMITTED TO [facility] ON 6/17 FOR APPROVAL     Patient/family informed of bed offers received:   Patient chooses bed at  Physician recommends and patient chooses bed at    Patient to be transferred to  on   Patient to be transferred to facility by   The following physician request were entered in Epic:   Additional Comments:  Derenda Fennel, LCSW [phone]

## 2012-02-14 NOTE — Clinical Social Work Note (Signed)
Spoke w [name], Artist, about patient's insurance.  Ratliffe states that pt was advised to apply for Medicaid at last hospitalization last year, but patient has not followed up.  Ratliffe will see patient today.  If PT recommends SNF, patient will need LOG to access.  CSW will continue to follow.  Clovis Cao Clinical Social Worker [phone])

## 2012-02-14 NOTE — Clinical Social Work Note (Signed)
CSW met with pt to further discuss d/c planning.  Per Kathie Rhodes, Artist, pt will not qualify for Medicaid due to assets.  Kathie Rhodes will follow up with husband to see about retirement accounts. CSW spoke with pt who reports she cannot pay much up front, which facilities require for admission.  Per Timothy Lasso, CSW Chiropodist, okay to move forward with LOG.  CSW faxed FL2 to surrounding SNFs and will return with bed offers and LOG decision.  LOG submitted for approval to [facility].  CSW to follow up tomorrow.   Derenda Fennel, [place] [phone]

## 2012-02-15 LAB — CBC
MCHC: 31.8 g/dL (ref 30.0–36.0)
Platelets: 269 10*3/uL (ref 150–400)
RDW: 16.3 % — ABNORMAL HIGH (ref 11.5–15.5)
WBC: 6.2 10*3/uL (ref 4.0–10.5)

## 2012-02-15 LAB — PROTIME-INR: INR: 2.12 — ABNORMAL HIGH (ref 0.00–1.49)

## 2012-02-15 MED ORDER — HYDROCODONE-ACETAMINOPHEN 10-325 MG PO TABS
1.0000 | ORAL_TABLET | ORAL | Status: DC | PRN
  Administered 2012-02-15 – 2012-02-16 (×3): 1 via ORAL
  Filled 2012-02-15 (×3): qty 1

## 2012-02-15 MED ORDER — SODIUM CHLORIDE 0.9 % IJ SOLN
INTRAMUSCULAR | Status: AC
  Administered 2012-02-15: 12:00:00
  Filled 2012-02-15: qty 3

## 2012-02-15 NOTE — Progress Notes (Signed)
Subjective: Less pain   Pain greater left than right    Objective: Vital signs in last 24 hours: Temp:  [98.1 F (36.7 C)-98.3 F (36.8 C)] 98.1 F (36.7 C) (06/18 1449) Pulse Rate:  [57-62] 62  (06/18 1449) Resp:  [16] 16  (06/18 1449) BP: (108-139)/(66-70) 139/70 mmHg (06/18 1449) SpO2:  [95 %-96 %] 95 % (06/18 1449)  Intake/Output from previous day: 06/17 0701 - 06/18 0700 In: 804 [P.O.:240; I.V.:564] Out: 1475 [Urine:1475] Intake/Output this shift:     Basename 02/15/12 0525 02/14/12 0502 02/13/12 0902  HGB 10.7* 10.6* 11.2*    Basename 02/15/12 0525 02/14/12 0502  WBC 6.2 5.8  RBC 4.34 4.27  HCT 33.6* 33.4*  PLT 269 287   No results found for this basename: NA:2,K:2,CL:2,CO2:2,BUN:2,CREATININE:2,GLUCOSE:2,CALCIUM:2 in the last 72 hours  Basename 02/15/12 0525 02/14/12 0502  LABPT -- --  INR 2.12* 2.54*    Neurologically intact Neurovascular intact Sensation intact distally Intact pulses distally  Assessment/Plan: Bed search  PT      Fuller Canada 02/15/2012, 7:13 PM

## 2012-02-15 NOTE — Progress Notes (Signed)
Physical Therapy Treatment Patient Details Name: [name] MRN: [id number] DOB: [birth year] Today's Date: 02/15/2012 Time: 7829-5621 PT Time Calculation (min): 49 min  PT Assessment / Plan / Recommendation Comments on Treatment Session  Attempted minimal weight bearing on LLE today but pt is unable to tolerate any weight on this extremity.  Daughter was present during tx and both pt and she were instructed in bed to chair transfers with sliding board.  Daughter understands technique.  Pt still has significant difficulty with transfer due to obesity and deconditioning.  We discussed their home setting and they may need a hospital bed...her beds at home are thick mattresses which will not be conducive for a sliding board.  I continue to recommend short term SNF prior to going home.    Follow Up Recommendations  Home health PT;Skilled nursing facility    Barriers to Discharge        Equipment Recommendations  3 in 1 bedside comode;Wheelchair (measurements);Hospital bed;Other (comment) (removable armrest on BSC, sliding board)    Recommendations for Other Services    Frequency     Plan Discharge plan needs to be updated;Equipment recommendations need to be updated    Precautions / Restrictions Restrictions Weight Bearing Restrictions: Yes RLE Weight Bearing: Non weight bearing   Pertinent Vitals/Pain     Mobility  Bed Mobility Bed Mobility: Scooting to HOB Supine to Sit: 6: Modified independent (Device/Increase time);HOB flat Sit to Supine: 6: Modified independent (Device/Increase time);HOB flat Scooting to HOB: 4: Min guard Transfers Sit to Stand: Other (comment) (attempted-could not tolerate weight on LLE) Lateral/Scoot Transfers: 4: Min guard;With armrests removed Details for Transfer Assistance: transfer continues to be very labored...instructed daughter in transfer technique Ambulation/Gait Ambulation/Gait Assistance: Not tested (comment)    Exercises     PT  Diagnosis:    PT Problem List:   PT Treatment Interventions:     PT Goals Acute Rehab PT Goals PT Goal: Supine/Side to Sit - Progress: Met PT Transfer Goal: Bed to Chair/Chair to Bed - Progress: Progressing toward goal  Visit Information  Last PT Received On: 02/15/12    Subjective Data  Subjective: feels better   Cognition       Balance     End of Session PT - End of Session Equipment Utilized During Treatment: Gait belt (sliding board) Activity Tolerance: Patient tolerated treatment well Patient left: in bed;with call bell/phone within reach;with family/visitor present Nurse Communication: Mobility status    Konrad Penta 02/15/2012, 9:41 AM

## 2012-02-15 NOTE — Progress Notes (Signed)
Physical Therapy Treatment Patient Details Name: [name] MRN: [id number] DOB: November 17, 1945 Today's Date: 02/15/2012 Time: 4098-1191 PT Time Calculation (min): 62 min  PT Assessment / Plan / Recommendation Comments on Treatment Session  Pt is more able to tolerate min weight on LLE today which facilitates bed to chair transfer.She stood momentarily with assist of Huntley Dec lift and subsequently her sliding board transfer is much less labored..  We discussed car transfers and decided that she will need EMS transport at d/c.  She is afraid of going home and not being succesful, so have tried to reassure her.                Follow Up Recommendations  Home health PT    Barriers to Discharge        Equipment Recommendations       Recommendations for Other Services    Frequency     Plan Discharge plan remains appropriate    Precautions / Restrictions     Pertinent Vitals/Pain     Mobility  Bed Mobility Supine to Sit: 6: Modified independent (Device/Increase time);HOB flat Sit to Supine: HOB flat;6: Modified independent (Device/Increase time) Scooting to [facility]: 6: Modified independent (Device/Increase time) Transfers Sit to Stand: 1: +2 Total assist (Sara lift used to begin conditioning LLE to weight bear) Sit to Stand: Patient Percentage: 20% Lateral/Scoot Transfers: 5: Supervision Transfer via Lift Equipment: Hydrographic surveyor Details for Transfer Assistance: after experiencing standing with Huntley Dec lift, pt was able to use LLE to assist with sliding board transfer and was much more adept Ambulation/Gait Ambulation/Gait Assistance: Not tested (comment) Naval architect Mobility: Yes Wheelchair Assistance: 4: Administrator, sports Details (indicate cue type and reason): instructed in using LLE to assist in propelling w/c Wheelchair Propulsion: Both upper extremities Wheelchair Parts Management: Needs assistance    Exercises     PT Diagnosis:    PT Problem  List:   PT Treatment Interventions:     PT Goals Acute Rehab PT Goals PT Transfer Goal: Bed to Chair/Chair to Bed - Progress: Progressing toward goal  Visit Information  Last PT Received On: 02/15/12    Subjective Data  Subjective: plans to go home at d/c   Cognition       Balance     End of Session PT - End of Session Equipment Utilized During Treatment: Gait belt;Other (comment) (sliding board) Patient left: in bed;with call bell/phone within reach    Konrad Penta 02/15/2012, 3:33 PM

## 2012-02-15 NOTE — Clinical Social Work Note (Signed)
CSW met with pt and pt's family.  Pt has decided to return home at d/c.  Dr. Romeo Apple notified of decision and plans d/c for tomorrow.  CM aware for equipment and home health needs.  Pt has adequate family support who are willing to provide assistance.  CSW will sign off unless further needs arise prior to d/c.  Derenda Fennel, LCSW [phone]

## 2012-02-15 NOTE — Clinical Social Work Note (Signed)
Spoke w family, explained that CSW Reubin Milan would follow up w them this afternoon.  Husband and two adult children were present in room, stated that they are discussing how to handle care for mother at discharge.  Appreciated information given by [facility] on cost of various elements of care needed, are working on plan to bring mother home at discharge and provide coverage through family.  State they have a family member who is a CNA and who will assist w care.  Are planning to get necessary equipment and supplies, understand that discharge may occur tomorrow or next day.  At this point, family is declining SNF placement due to cost.  Clovis Cao Clinical Social Worker [phone])

## 2012-02-16 ENCOUNTER — Telehealth: Payer: Self-pay | Admitting: Orthopedic Surgery

## 2012-02-16 MED ORDER — HYDROCODONE-ACETAMINOPHEN 10-325 MG PO TABS: 1.0000 | ORAL_TABLET | ORAL | Status: AC | PRN

## 2012-02-16 MED ORDER — IBUPROFEN 800 MG PO TABS: 800.0000 mg | ORAL_TABLET | Freq: Three times a day (TID) | ORAL | Status: AC | PRN

## 2012-02-16 MED ORDER — WARFARIN SODIUM 2.5 MG PO TABS: 2.5000 mg | ORAL_TABLET | Freq: Every day | ORAL | Status: DC

## 2012-02-16 NOTE — Progress Notes (Signed)
Physical Therapy Treatment Patient Details Name: [name] MRN: [id number] DOB: 1946-02-21 Today's Date: 02/16/2012 Time: 1000-1032 PT Time Calculation (min): 32 min  PT Assessment / Plan / Recommendation Comments on Treatment Session  Pt advised that she has been discharged today.  We again instructed in sliding board transfer, this time bed to [facility]  She was able to perform without assist.  We also initiated standing with a walker at bedside, elevating the bed in order to facilitate transfer.   She did well, able to stand for about 20 sec x 3 reps with walker.  she should be able to transition home at d/c    Follow Up Recommendations       Barriers to Discharge        Equipment Recommendations       Recommendations for Other Services    Frequency     Plan All goals met and education completed, patient dischaged from PT services    Precautions / Restrictions Restrictions Weight Bearing Restrictions: Yes RLE Weight Bearing: Non weight bearing   Pertinent Vitals/Pain     Mobility  Bed Mobility Bed Mobility: Supine to Sit;Sit to Supine Supine to Sit: 6: Modified independent (Device/Increase time);HOB flat Sit to Supine: 6: Modified independent (Device/Increase time);HOB flat Scooting to HOB: 6: Modified independent (Device/Increase time) Transfers Sit to Stand: 3: Mod assist;With upper extremity assist;From bed (bed height elevated, NWB R) Sit to Stand: Patient Percentage: 70% Stand to Sit: 4: Min assist Stand Pivot Transfers: Not tested (comment) Lateral/Scoot Transfers: 6: Modified independent (Device/Increase time);With armrests removed;With slide board Details for Transfer Assistance: Pt now able to tolerate more weight bearing on RLE...able to stand for about 20 sec with walker, x 3 reps with min assist to maintain stance Ambulation/Gait Ambulation/Gait Assistance: Not tested (comment)    Exercises     PT Diagnosis:    PT Problem List:   PT Treatment  Interventions:     PT Goals Acute Rehab PT Goals PT Transfer Goal: Bed to Chair/Chair to Bed - Progress: Met  Visit Information  Last PT Received On: 02/16/12    Subjective Data  Subjective: I might go home today ofr tomorrow   Cognition       Balance     End of Session PT - End of Session Equipment Utilized During Treatment: Gait belt Activity Tolerance: Patient tolerated treatment well Patient left: in bed;with call bell/phone within reach;with family/visitor present    Konrad Penta 02/16/2012, 10:39 AM

## 2012-02-16 NOTE — Discharge Instructions (Signed)
Ankle Fracture A fracture is a break in the bone. A cast or splint is used to protect and keep your injured bone from moving.  HOME CARE INSTRUCTIONS     To lessen the swelling, keep the injured leg elevated while sitting or lying down.   Apply ice to the injury for 15 to 20 minutes, 3 to 4 times per day while awake for 2 days. Put the ice in a plastic bag and place a thin towel between the bag of ice and your cast.   If you have a plaster or fiberglass cast:   Do not try to scratch the skin under the cast using sharp or pointed objects.   Check the skin around the cast every day. You may put lotion on any red or sore areas.   Keep your cast dry and clean.   If you have a plaster splint:   Wear the splint as directed.   You may loosen the elastic around the splint if your toes become numb, tingle, or turn cold or blue.   Do not put pressure on any part of your cast or splint; it may break. Rest your cast only on a pillow the first 24 hours until it is fully hardened.   Your cast or splint can be protected during bathing with a plastic bag. Do not lower the cast or splint into water.   Take medications as directed by your caregiver. Only take over-the-counter or prescription medicines for pain, discomfort, or fever as directed by your caregiver.   Do not drive a vehicle until your caregiver specifically tells you it is safe to do so.   If your caregiver has given you a follow-up appointment, it is very important to keep that appointment. Not keeping the appointment could result in a chronic or permanent injury, pain, and disability. If there is any problem keeping the appointment, you must call back to this facility for assistance.  SEEK IMMEDIATE MEDICAL CARE IF:   Your cast gets damaged or breaks.   You have continued severe pain or more swelling than you did before the cast was put on.   Your skin or toenails below the injury turn blue or gray, or feel cold or numb.   There  is a bad smell or new stains and/or purulent (pus like) drainage coming from under the cast.  If you do not have a window in your cast for observing the wound, a discharge or minor bleeding may show up as a stain on the outside of your cast. Report these findings to your caregiver. MAKE SURE YOU:   Understand these instructions.   Will watch your condition.   Will get help right away if you are not doing well or get worse.  Document Released: 08/13/2000 Document Revised: 08/05/2011 Document Reviewed: 03/19/2008 [facility] Patient Information 2012 [place], [place].  [facility] for physical therapy and bedside commode, wheelchair, slide board, and hospital bed.

## 2012-02-16 NOTE — Clinical Social Work Note (Signed)
Plan is for patient to go to [facility] in [place] for 2 weeks of rehab w LOG.  Z Shon Baton will contact [facility] to let them know we will be doing LOG.  Oncoming CSW Derenda Fennel will finish discharge process.  Clovis Cao Clinical Social Worker [phone])

## 2012-02-16 NOTE — Telephone Encounter (Signed)
At 12:50pm, per voice message, received call from Santa Genera, social worker at [facility], notifying Dr. Romeo Apple of the following information (copied and pasted from patient's chart):   Plan is for patient to go to [facility]  in [place] for 2 weeks of rehab w LOG. Z Shon Baton will contact [facility] to let them know we will be doing LOG. Oncoming CSW Derenda Fennel will finish discharge process.  [name], LCSW  Clinical Social Worker [phone])  I called ph# back, [phone], to relay that we received the message, and to contact our office if any further questions.

## 2012-02-16 NOTE — Discharge Summary (Signed)
Physician Discharge Summary  Patient ID: [name] MRN: [id number] DOB/AGE: 1945/12/28 66 y.o.  Admit date: 02/12/2012 Discharge date: 02/16/2012  Admission Diagnoses: BILATERAL ANKLE FRACTURES   Discharge Diagnoses: SAME Active Problems:  Bilateral ankle fractures  Contusion of shoulder, right   Discharged Condition: stable  Hospital Course: UNREMARKABLE  Consults: None  Significant Diagnostic Studies: labs: INR/Prothrombin Time REMAINED  2-2.3 ON 2.5 MG WARFARIN Treatments: PT  Discharge Exam: Blood pressure 98/51, pulse 74, temperature 98.5 F (36.9 C), temperature source Oral, resp. rate 18, height 5\' 6"  (1.676 m), weight 108.8 kg (239 lb 13.8 oz), SpO2 95.00%. Extremities: extremities normal, atraumatic, no cyanosis or edema  Disposition: 01-Home or Self Care  Discharge Orders    Future Orders Please Complete By Expires   Diet - low sodium heart healthy      Increase activity slowly      Discharge instructions      Comments:   WEIGHT BEARING AS TOLERATED ON THE LEFT NON WEIGHT ON THE RIGHT     Medication List  As of 02/16/2012  8:08 AM   STOP taking these medications         naproxen 500 MG tablet         TAKE these medications         dicyclomine 20 MG tablet   Commonly known as: BENTYL   Take 20 mg by mouth 3 (three) times daily as needed. For stomach pain      hydrochlorothiazide 25 MG tablet   Commonly known as: HYDRODIURIL   Take 25 mg by mouth daily.      HYDROcodone-acetaminophen 10-325 MG per tablet   Commonly known as: NORCO   Take 1 tablet by mouth every 4 (four) hours as needed.      ibuprofen 800 MG tablet   Commonly known as: ADVIL,MOTRIN   Take 1 tablet (800 mg total) by mouth every 8 (eight) hours as needed for pain.      nitroGLYCERIN 0.4 MG SL tablet   Commonly known as: NITROSTAT   Place 0.4 mg under the tongue every 5 (five) minutes x 3 doses as needed.      omeprazole 20 MG capsule   Commonly known as: PRILOSEC     Take 20 mg by mouth every morning.      potassium chloride 10 MEQ tablet   Commonly known as: K-DUR   Take 10 mEq by mouth every morning.      PROBIOTIC Caps   Take 1 capsule by mouth every morning.      warfarin 2.5 MG tablet   Commonly known as: COUMADIN   Take 1 tablet (2.5 mg total) by mouth daily at 6 PM.           Follow-up Information    Follow up with Fuller Canada, MD on 03/09/2012.   Contact information:   [address] [place] [address] [phone]          Signed: Fuller Canada 02/16/2012, 8:08 AM

## 2012-02-16 NOTE — Clinical Social Work Note (Signed)
[facility] agreeable to LOG and report okay to send pt.  Pt's sister is paying for EMS transport.  Pt requests for husband and daughter to not find out about payment.  EMS and RN aware.  Thurston Hole, CSW, completed d/c packet and faxed d/c summary.  Pt agreeable to above.  Derenda Fennel, [place] [phone]

## 2012-02-16 NOTE — Clinical Social Work Placement (Signed)
Clinical Social Work Department CLINICAL SOCIAL WORK PLACEMENT NOTE 02/16/2012  Patient:  [name], [name]  Account Number:  [id number] Admit date:  02/12/2012  Clinical Social Worker:  Derenda Fennel, LCSW  Date/time:  02/14/2012 03:30 PM  Clinical Social Work is seeking post-discharge placement for this patient at the following level of care:   SKILLED NURSING   (*CSW will update this form in Epic as items are completed)   02/14/2012  Patient/family provided with [facility] Department of Clinical Social Work's list of facilities offering this level of care within the geographic area requested by the patient (or if unable, by the patient's family).  02/14/2012  Patient/family informed of their freedom to choose among providers that offer the needed level of care, that participate in Medicare, Medicaid or managed care program needed by the patient, have an available bed and are willing to accept the patient.  02/14/2012  Patient/family informed of [facility]' ownership interest in [facility], as well as of the fact that they are under no obligation to receive care at this facility.  PASARR submitted to EDS on 02/14/2012 PASARR number received from EDS on 02/14/2012  FL2 transmitted to all facilities in geographic area requested by pt/family on  02/14/2012 FL2 transmitted to all facilities within larger geographic area on 02/14/2012  Patient informed that his/her managed care company has contracts with or will negotiate with  certain facilities, including the following:   N/A PROBABLE LOG SUBMITTED TO [facility] ON 6/17 FOR APPROVAL     Patient/family informed of bed offers received:  02/16/2012 Patient chooses bed at [facility] Physician recommends and patient chooses bed at  [facility]  Patient to be transferred to [facility] on  02/16/2012 Patient to be transferred to facility by [facility]  The following physician  request were entered in Epic:   Additional Comments:  Derenda Fennel, LCSW [phone]

## 2012-02-17 NOTE — Progress Notes (Signed)
Patient discharged to SNF in stable condition.  

## 2012-02-17 NOTE — Clinical Social Work Placement (Signed)
     Clinical Social Work Department CLINICAL SOCIAL WORK PLACEMENT NOTE 02/17/2012  Patient:  [name], [name]  Account Number:  [id number] Admit date:  02/12/2012  Clinical Social Worker:  Sherrlyn Hock  Date/time:  02/14/2012 03:30 PM  Clinical Social Work is seeking post-discharge placement for this patient at the following level of care:   SKILLED NURSING   (*CSW will update this form in Epic as items are completed)   02/14/2012  Patient/family provided with [facility] Department of Clinical Social Works list of facilities offering this level of care within the geographic area requested by the patient (or if unable, by the patients family).  02/14/2012  Patient/family informed of their freedom to choose among providers that offer the needed level of care, that participate in Medicare, Medicaid or managed care program needed by the patient, have an available bed and are willing to accept the patient.  02/14/2012  Patient/family informed of [facility] ownership interest in [facility], as well as of the fact that they are under no obligation to receive care at this facility.  PASARR submitted to EDS on 02/14/2012 PASARR number received from EDS on 02/14/2012  FL2 transmitted to all facilities in geographic area requested by pt/family on  02/14/2012 FL2 transmitted to all facilities within larger geographic area on 02/14/2012  Patient informed that his/her managed care company has contracts with or will negotiate with  certain facilities, including the following:   N/A PROBABLE LOG SUBMITTED TO [facility] ON 6/17 FOR APPROVAL     Patient/family informed of bed offers received:  02/16/2012 Patient chooses bed at [facility] Physician recommends and patient chooses bed at  [facility]  Patient to be transferred to [facility] on  02/16/2012 Patient to be transferred to facility by [facility]  The following  physician request were entered in Epic:   Additional Comments:

## 2012-02-21 ENCOUNTER — Encounter (HOSPITAL_COMMUNITY): Payer: Self-pay | Admitting: Orthopedic Surgery

## 2012-03-05 NOTE — ED Provider Notes (Signed)
Medical screening examination/treatment/procedure(s) were performed by non-physician practitioner and as supervising physician I was immediately available for consultation/collaboration.   Laray Anger, DO 03/05/12 0216

## 2012-03-15 ENCOUNTER — Encounter: Payer: Self-pay | Admitting: Orthopedic Surgery

## 2012-03-15 ENCOUNTER — Ambulatory Visit (INDEPENDENT_AMBULATORY_CARE_PROVIDER_SITE_OTHER): Payer: Self-pay | Admitting: Orthopedic Surgery

## 2012-03-15 ENCOUNTER — Ambulatory Visit (INDEPENDENT_AMBULATORY_CARE_PROVIDER_SITE_OTHER): Payer: Self-pay

## 2012-03-15 VITALS — BP 136/80 | Ht 66.0 in | Wt 239.0 lb

## 2012-03-15 DIAGNOSIS — S82891A Other fracture of right lower leg, initial encounter for closed fracture: Secondary | ICD-10-CM

## 2012-03-15 DIAGNOSIS — S82899A Other fracture of unspecified lower leg, initial encounter for closed fracture: Secondary | ICD-10-CM

## 2012-03-15 MED ORDER — HYDROCODONE-ACETAMINOPHEN 10-325 MG PO TABS: 1.0000 | ORAL_TABLET | Freq: Four times a day (QID) | ORAL | Status: AC | PRN

## 2012-03-15 NOTE — Patient Instructions (Signed)
Wear left ankle brace when walking   Full weight bearing on right ankle

## 2012-03-15 NOTE — Progress Notes (Signed)
Patient ID: [name], female   DOB: Oct 03, 1945, 66 y.o.   MRN: [id number] Chief Complaint  Patient presents with  . Ankle Injury    hospital follow up, bilateral ankle fractures, DOI 02/12/12    Right ankle fracture   Left ankle sprain / avulsion fractures   X-rays right ankle normal   Left ankle sprain CAM walker   1 month x-rays right ankle

## 2012-04-05 ENCOUNTER — Telehealth: Payer: Self-pay | Admitting: *Deleted

## 2012-04-05 NOTE — Telephone Encounter (Signed)
She is a self-pay patient  If she is ambulatory and can get to therapy he would be cheaper to have outpatient therapy  However if she can't get a ride to outpatient therapy she would meet the criteria. For home therapy

## 2012-04-10 NOTE — Telephone Encounter (Signed)
[name] states she wants to wait to see Dr Romeo Apple on 8/20 before ordering more PT

## 2012-04-11 NOTE — ED Provider Notes (Signed)
Medical screening examination/treatment/procedure(s) were performed by non-physician practitioner and as supervising physician I was immediately available for consultation/collaboration.   Whalen Trompeter M Chakia Counts, DO 04/11/12 1519 

## 2012-04-12 ENCOUNTER — Emergency Department (HOSPITAL_COMMUNITY)
Admission: EM | Admit: 2012-04-12 | Discharge: 2012-04-12 | Disposition: A | Discharge status: Home / Self Care | Payer: Self-pay | Attending: Emergency Medicine | Admitting: Emergency Medicine

## 2012-04-12 ENCOUNTER — Emergency Department (HOSPITAL_COMMUNITY): Payer: Self-pay

## 2012-04-12 DIAGNOSIS — Z79899 Other long term (current) drug therapy: Secondary | ICD-10-CM | POA: Insufficient documentation

## 2012-04-12 DIAGNOSIS — Z9089 Acquired absence of other organs: Secondary | ICD-10-CM | POA: Insufficient documentation

## 2012-04-12 DIAGNOSIS — R51 Headache: Secondary | ICD-10-CM | POA: Insufficient documentation

## 2012-04-12 DIAGNOSIS — Z7901 Long term (current) use of anticoagulants: Secondary | ICD-10-CM | POA: Insufficient documentation

## 2012-04-12 DIAGNOSIS — I10 Essential (primary) hypertension: Secondary | ICD-10-CM | POA: Insufficient documentation

## 2012-04-12 DIAGNOSIS — E119 Type 2 diabetes mellitus without complications: Secondary | ICD-10-CM | POA: Insufficient documentation

## 2012-04-12 MED ORDER — DIPHENHYDRAMINE HCL 50 MG/ML IJ SOLN
25.0000 mg | Freq: Once | INTRAMUSCULAR | Status: AC
  Administered 2012-04-12: 25 mg via INTRAVENOUS
  Filled 2012-04-12: qty 1

## 2012-04-12 MED ORDER — ONDANSETRON HCL 4 MG/2ML IJ SOLN
4.0000 mg | Freq: Once | INTRAMUSCULAR | Status: AC
  Administered 2012-04-12: 4 mg via INTRAVENOUS
  Filled 2012-04-12: qty 2

## 2012-04-12 MED ORDER — PROCHLORPERAZINE 25 MG RE SUPP: RECTAL | Status: DC

## 2012-04-12 MED ORDER — METOCLOPRAMIDE HCL 5 MG/ML IJ SOLN
10.0000 mg | Freq: Once | INTRAMUSCULAR | Status: AC
  Administered 2012-04-12: 10 mg via INTRAVENOUS
  Filled 2012-04-12: qty 2

## 2012-04-12 MED ORDER — SODIUM CHLORIDE 0.9 % IV SOLN
INTRAVENOUS | Status: DC
  Administered 2012-04-12: 16:00:00 via INTRAVENOUS

## 2012-04-12 NOTE — ED Provider Notes (Signed)
History    This chart was scribed for Ward Givens, MD, MD by Smitty Pluck. The patient was seen in room APA07 and the patient's care was started at 3:57PM.   CSN: [id number]  Arrival date & time 04/12/12  1505   First MD Initiated Contact with Patient 04/12/12 1548      Chief Complaint  Patient presents with  . Headache    (Consider location/radiation/quality/duration/timing/severity/associated sxs/prior treatment) The history is provided by the patient.   [name] is a 66 y.o. female who presents to the Emergency Department complaining of moderate, throbbing headache 1 day ago at 7:30PM. Pt reports sudden onset while going to bed. Pt reports headache was located across right frontal. Pt reports that she could not sleep due to headache. She reports she felt pop over right eye sometime during the night. Reports having right blurred vision and numbness in fingers of both hands. Denies nausea and vomiting. Pt reports that she has been having headaches frequently within the last week. Pt had coumadin checked 1 day ago and it was 1.8.She is to increase her coumadin dose on Mon and Fridays.No photophobia.   Pt has hx of blood clot in her left arm. Denies family hx of blood clots. Pt reports having broken ankles due to falling down steps in April. Denies smoking and drinking alcohol.   PCP is Dr. Lilyan Punt  Ophthalmologist Dr Alexia Freestone in [place] Orthopedist Dr Romeo Apple  Past Medical History  Diagnosis Date  . Hypertension   . Diverticulitis   . Cyst on liver  . Blood clot of artery under arm   . Diabetes mellitus     controlled by diet    Past Surgical History  Procedure Date  . Back surgery   . Cholecystectomy   . Knee arthrocentesis left  . Breast surgery right    cyst removed  . Abdominal hysterectomy   . Tubal ligation   . Cast application 02/14/2012    Procedure: CAST APPLICATION;  Surgeon: Vickki Hearing, MD;  Location: [facility];  Service: Orthopedics;   Laterality: Right;  procedure room     Family History  Problem Relation Age of Onset  . Stroke Mother   . Cancer Sister   . Diabetes Sister   . Seizures Brother   . Diabetes Brother     History  Substance Use Topics  . Smoking status: Never Smoker   . Smokeless tobacco: Never Used  . Alcohol Use: No  Lives with spouse Lives at home  OB History    Grav Para Term Preterm Abortions TAB SAB Ect Mult Living   3 3 3       3       Review of Systems  Neurological: Positive for numbness and headaches.  All other systems reviewed and are negative.  10 Systems reviewed and all are negative for acute change except as noted in the HPI.    Allergies  Penicillins  Home Medications   Current Outpatient Rx  Name Route Sig Dispense Refill  . DICYCLOMINE HCL 20 MG PO TABS Oral Take 20 mg by mouth 3 (three) times daily as needed. For stomach pain    . HYDROCHLOROTHIAZIDE 25 MG PO TABS Oral Take 25 mg by mouth daily.    [name] NITROGLYCERIN 0.4 MG SL SUBL Sublingual Place 0.4 mg under the tongue every 5 (five) minutes x 3 doses as needed.    [name] OMEPRAZOLE 20 MG PO CPDR Oral Take 20 mg by mouth every  morning.     [name] POTASSIUM CHLORIDE ER 10 MEQ PO TBCR Oral Take 10 mEq by mouth every morning.    [name] PROBIOTIC PO CAPS Oral Take 1 capsule by mouth every morning.     . WARFARIN SODIUM 2.5 MG PO TABS Oral Take 1 tablet (2.5 mg total) by mouth daily at 6 PM. 30 tablet 0    BP 129/46  Pulse 73  Temp 98.1 F (36.7 C) (Oral)  Resp 18  Ht 5\' 5"  (1.651 m)  Wt 220 lb (99.791 kg)  BMI 36.61 kg/m2  SpO2 99%  Vital signs normal    Physical Exam  Nursing note and vitals reviewed. Constitutional: She is oriented to person, place, and time. She appears well-developed and well-nourished.  Non-toxic appearance. She does not appear ill. No distress.  HENT:  Head: Normocephalic and atraumatic.  Right Ear: External ear normal.  Left Ear: External ear normal.  Nose: Nose normal. No mucosal edema or  rhinorrhea.  Mouth/Throat: Oropharynx is clear and moist and mucous membranes are normal. No dental abscesses or uvula swelling.       Soreness to right forehead just above the eyebrow   Eyes: EOM are normal. Pupils are equal, round, and reactive to light. Right eye exhibits no discharge and no exudate. Right conjunctiva is injected (diffusely). Right conjunctiva has no hemorrhage.  Neck: Normal range of motion and full passive range of motion without pain. Neck supple.  Cardiovascular: Normal rate, regular rhythm and normal heart sounds.  Exam reveals no gallop and no friction rub.   No murmur heard. Pulmonary/Chest: Effort normal and breath sounds normal. No respiratory distress. She has no wheezes. She has no rhonchi. She has no rales. She exhibits no tenderness and no crepitus.  Abdominal: Soft. Normal appearance and bowel sounds are normal. She exhibits no distension. There is no tenderness. There is no rebound and no guarding.  Musculoskeletal: Normal range of motion. She exhibits no edema and no tenderness.       Moves all extremities well.   Neurological: She is alert and oriented to person, place, and time. She has normal strength. No cranial nerve deficit.       Equal grip strength No pronator drift Fiberglass short leg splint on right with post op shoe Cam walker on left   Skin: Skin is warm, dry and intact. No rash noted. No erythema. No pallor.  Psychiatric: She has a normal mood and affect. Her speech is normal and behavior is normal. Her mood appears not anxious.    ED Course  Procedures (including critical care time) DIAGNOSTIC STUDIES: Oxygen Saturation is 99% on room air, normal by my interpretation.    COORDINATION OF CARE: 4:04PM EDP discusses pt ED treatment with pt  4:04PM EDP ordered medication: Scheduled Meds:   . diphenhydrAMINE  25 mg Intravenous Once  . metoCLOPramide  10 mg Intravenous Once  . ondansetron  4 mg Intravenous Once   Continuous Infusions:    . sodium chloride 100 mL/hr at 04/12/12 1617   PRN Meds:.  5:38PM EDP rechecks pt and discusses pt's labs. Pt's headache is almost gone and she is feeling better after medication.      Ct Head Wo Contrast  04/12/2012  *RADIOLOGY REPORT*  Clinical Data: Headache.  On Coumadin  CT HEAD WITHOUT CONTRAST  Technique:  Contiguous axial images were obtained from the base of the skull through the vertex without contrast.  Comparison: CT 01/26/2007  Findings: Ventricle size is normal.  Negative for intracranial hemorrhage.  Negative for infarct or mass.  No edema.  Calvarium is intact.  IMPRESSION: Normal  Original Report Authenticated By: Camelia Phenes, M.D.      1. Headache    New Prescriptions   PROCHLORPERAZINE (COMPAZINE) 25 MG SUPPOSITORY    Unwrap and insert 1 PR TID  PRN nausea, vomiting or headache    Plan discharge  Devoria Albe, MD, FACEP    MDM   I personally performed the services described in this documentation, which was scribed in my presence. The recorded information has been reviewed and considered.  Devoria Albe, MD, Armando Gang    Ward Givens, MD 04/13/12 [phone]

## 2012-04-12 NOTE — ED Notes (Addendum)
Headache since last pm .  Had blood clot to rt arm in April. Has cast on rt leg and splint on lt leg , Fx both ankles in April  Felt a "pop" in rt forehead last night , and discomfort to rt eye.  Increased coumadin recently.

## 2012-04-18 ENCOUNTER — Encounter: Payer: Self-pay | Admitting: Orthopedic Surgery

## 2012-04-18 ENCOUNTER — Ambulatory Visit (INDEPENDENT_AMBULATORY_CARE_PROVIDER_SITE_OTHER): Payer: Self-pay

## 2012-04-18 ENCOUNTER — Ambulatory Visit (INDEPENDENT_AMBULATORY_CARE_PROVIDER_SITE_OTHER): Payer: Self-pay | Admitting: Orthopedic Surgery

## 2012-04-18 VITALS — BP 110/60 | Ht 65.0 in | Wt 220.0 lb

## 2012-04-18 DIAGNOSIS — S82899A Other fracture of unspecified lower leg, initial encounter for closed fracture: Secondary | ICD-10-CM

## 2012-04-18 DIAGNOSIS — S82891A Other fracture of right lower leg, initial encounter for closed fracture: Secondary | ICD-10-CM

## 2012-04-18 NOTE — Progress Notes (Signed)
Patient ID: [name], female   DOB: [birth year], 66 y.o.   MRN: [id number] Chief Complaint  Patient presents with  . Wound Check    1 month follow up and xrays Right ankle, DOS 02/14/12    BP 110/60  Ht 5\' 5"  (1.651 m)  Wt 220 lb (99.791 kg)  BMI 36.61 kg/m2  Followup visit right lateral malleolus fracture  Left avulsion fractures.  Right ankle treated with a short-leg cast  Left ankle treated with Cam Walker.  The ankle x-ray showed that the fibular fracture is healing the ankle mortise is intact  Clinical exam there's tenderness over the fibula.  The left ankle has some swelling but is stable  Recommend Cam Walker for the right ankle weightbearing as tolerated both ankles return in a month for x-ray right ankle  Resume home health for gait training weight bearing as tolerated

## 2012-04-18 NOTE — Patient Instructions (Addendum)
wbat both leg  [facility]

## 2012-04-24 ENCOUNTER — Telehealth: Payer: Self-pay | Admitting: Orthopedic Surgery

## 2012-04-24 NOTE — Telephone Encounter (Signed)
Patient called, is concerned about call received from [facility], following their receipt of Dr. Mort Sawyers orders for continued home therapy.  Patient relays that she has been unable to pursue insurance with Medicaid at this time, and has no insurance; states has applied for "100% hospital discount" only.  She was quoted a fee of $175 per home visit, per [facility], which she said she would be unable to do.  Is she a candidate for out-patient therapy, or are there other options for the home therapy?  She states she is trying to do home exercise program, but feels she is not doing them correctly. Her ph # is [phone].

## 2012-04-25 NOTE — Telephone Encounter (Signed)
Ok cancel therapy if she cant afford it

## 2012-05-17 ENCOUNTER — Ambulatory Visit (INDEPENDENT_AMBULATORY_CARE_PROVIDER_SITE_OTHER): Payer: Self-pay

## 2012-05-17 ENCOUNTER — Ambulatory Visit (INDEPENDENT_AMBULATORY_CARE_PROVIDER_SITE_OTHER): Payer: Self-pay | Admitting: Orthopedic Surgery

## 2012-05-17 ENCOUNTER — Encounter: Payer: Self-pay | Admitting: Orthopedic Surgery

## 2012-05-17 VITALS — BP 120/78 | Ht 65.0 in | Wt 220.0 lb

## 2012-05-17 DIAGNOSIS — S93409A Sprain of unspecified ligament of unspecified ankle, initial encounter: Secondary | ICD-10-CM

## 2012-05-17 DIAGNOSIS — S82899A Other fracture of unspecified lower leg, initial encounter for closed fracture: Secondary | ICD-10-CM

## 2012-05-17 NOTE — Progress Notes (Signed)
Patient ID: [name], female   DOB: Mar 31, 1946, 66 y.o.   MRN: [id number] Chief Complaint  Patient presents with  . Follow-up    recheck and xray Rt ankle fracture, DOS 02/14/12    The patient is improving chest x-ray of her right ankle today which had a lateral malleolar fracture looks to be clinically healed  Chest and tenderness and swelling and difficulty with her ambulation. We recommend physical therapy for 6-8 weeks and followup for a checkup to see how she is doing  She can remove the Cam Walker. She is weightbearing as tolerated with a walker.

## 2012-05-17 NOTE — Patient Instructions (Addendum)
Progressive activities as tolerated  Gradually increase  activities as tolerated.  CALL HOSPITAL TO SCHEDULE THERAPY APPOINTMENT

## 2012-05-18 ENCOUNTER — Telehealth: Payer: Self-pay | Admitting: Orthopedic Surgery

## 2012-05-18 NOTE — Telephone Encounter (Signed)
Place brace back on for 10 days

## 2012-05-18 NOTE — Telephone Encounter (Signed)
Patient advised.

## 2012-05-18 NOTE — Telephone Encounter (Signed)
[name] says her ankle is hurting worse this morning. She is taking the Hydrocodone every 6 hours and still having pain.  Asking if she can take the Hydrocodone more often, or do you want to add another medicine to go along with this.  She uses [facility] in [place]

## 2012-05-23 ENCOUNTER — Ambulatory Visit (HOSPITAL_COMMUNITY)
Admission: RE | Admit: 2012-05-23 | Discharge: 2012-05-23 | Disposition: A | Discharge status: Home / Self Care | Payer: Self-pay | Source: Ambulatory Visit | Attending: Orthopedic Surgery | Admitting: Orthopedic Surgery

## 2012-05-23 DIAGNOSIS — M25676 Stiffness of unspecified foot, not elsewhere classified: Secondary | ICD-10-CM | POA: Insufficient documentation

## 2012-05-23 DIAGNOSIS — IMO0001 Reserved for inherently not codable concepts without codable children: Secondary | ICD-10-CM | POA: Insufficient documentation

## 2012-05-23 DIAGNOSIS — R262 Difficulty in walking, not elsewhere classified: Secondary | ICD-10-CM | POA: Insufficient documentation

## 2012-05-23 DIAGNOSIS — M6281 Muscle weakness (generalized): Secondary | ICD-10-CM | POA: Insufficient documentation

## 2012-05-23 DIAGNOSIS — M25673 Stiffness of unspecified ankle, not elsewhere classified: Secondary | ICD-10-CM | POA: Insufficient documentation

## 2012-05-23 DIAGNOSIS — M25579 Pain in unspecified ankle and joints of unspecified foot: Secondary | ICD-10-CM | POA: Insufficient documentation

## 2012-05-23 DIAGNOSIS — I10 Essential (primary) hypertension: Secondary | ICD-10-CM | POA: Insufficient documentation
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
## 2012-05-24 ENCOUNTER — Ambulatory Visit (HOSPITAL_COMMUNITY)
Admission: RE | Admit: 2012-05-24 | Discharge: 2012-05-24 | Disposition: A | Discharge status: Home / Self Care | Payer: Self-pay | Source: Ambulatory Visit | Attending: Orthopedic Surgery | Admitting: Orthopedic Surgery

## 2012-05-24 NOTE — Progress Notes (Signed)
Physical Therapy Treatment Patient Details  Name: [name] MRN: [id number] Date of Birth: [birth year]  Today's Date: 05/24/2012 Time: 8295-6213 PT Time Calculation (min): 43 min  Visit#: 2  of 12   Re-eval: 06/22/12  Charge: Gait 23', therex 12', self care 8'  Subjective: Symptoms/Limitations Symptoms: Pt stated pain free this session, has taken pain meds prior this session.  Pt excited she will be able to take off CAM boot on Saturday. Pain Assessment Currently in Pain?: No/denies  Objective:   Exercise/Treatments Standing Gait Training: Outdoor gait training with RW CGA incline slopes x25 min Other Standing Knee Exercises: Romberg with eyes open 10", 40" no HHA  Sidelying Hip ABduction: 15 reps Prone  Hamstring Curl: 15 reps Hip Extension: 15 reps      Physical Therapy Assessment and Plan PT Assessment and Plan Clinical Impression Statement: Began OPPT session per PT POC with focus on proper gait mechanic in outdoor setting ambulating with RW, CGA with incline/decline slopes, pt with no LOB episodes.  Also began low level balance activity, pt did require assistance with LOB episode with romberg stance eyes open.  Pt was able to donn/doff CAM boot with min assistance. PT Plan: Continue outdoor ambulation with RW progress to cane (may want to start indoors with [facility]) and continue low level balance activities to improve safety and pt.'s confidence/ reduce fear of falling.    Goals    Problem List Patient Active Problem List  Diagnosis  . HYPERCHOLESTEROLEMIA  . DEPRESSION  . HYPERTENSION  . PEPTIC ULCER DISEASE  . HIATAL HERNIA  . CONSTIPATION  . HEPATIC CYST  . HEMATEMESIS  . OSTEOARTHRITIS  . NAUSEA ALONE  . ABDOMINAL PAIN, CHRONIC  . Chest pain, unspecified  . Hypokalemia  . Bradycardia  . Giddiness  . Bilateral ankle fractures  . Contusion of shoulder, right  . Ankle fracture  . Ankle sprain    PT - End of Session Activity Tolerance:  Patient tolerated treatment well General Behavior During Session: [facility] for tasks performed Cognition: [facility] for tasks performed  GP    Juel Burrow 05/24/2012, 11:26 AM

## 2012-05-25 ENCOUNTER — Ambulatory Visit (HOSPITAL_COMMUNITY)
Admission: RE | Admit: 2012-05-25 | Discharge: 2012-05-25 | Disposition: A | Discharge status: Home / Self Care | Payer: Self-pay | Source: Ambulatory Visit | Attending: Family Medicine | Admitting: Family Medicine

## 2012-05-25 NOTE — Progress Notes (Signed)
Physical Therapy Treatment Patient Details  Name: [name] MRN: [id number] Date of Birth: [birth year]  Today's Date: 05/25/2012 Time: 1302-1402 PT Time Calculation (min): 60 min  Visit#: 3  of 12   Re-eval: 06/22/12  Charge: Gait 23', NMR 15', therex 12', ice 10'  Subjective: Symptoms/Limitations Symptoms: Pt had MD apt this morning and stated he was happy with progress.  Pt given permission to ambulate wtihout CAM boot.  Pain Assessment Currently in Pain?: No/denies  Objective:   Exercise/Treatments Aerobic Stationary Bike: 6' @ 1.0 seat 8 Standing Heel Raises: 5 reps;Limitations Heel Raises Limitations: Toe raises 10 x Gait Training: Gait training with SPC, min assistance/vc-ing for correct sequencing x 23 min Other Standing Knee Exercises: Romberg with eyes open 1x 1', eyes closed 2x 30" no HHA Other Standing Knee Exercises: Tandem stance 2x 30" no HHA   Modalities Modalities: Cryotherapy Cryotherapy Number Minutes Cryotherapy: 10 Minutes Cryotherapy Location: Ankle Type of Cryotherapy: Ice pack  Physical Therapy Assessment and Plan PT Assessment and Plan Clinical Impression Statement: Began gait training indoors with SPC min assistance, pt with good gait mechanics following demonstration and vc-ing for proper sequencing andtechnique.  Pt able to romberg stance 1' with eyes open no HHA with no LOB following  vc-ing for spatial awareness, min assistance with eyes closed PT Plan: Continue gait training and low level balance activities to improve safety and pt.'s confidence    Goals    Problem List Patient Active Problem List  Diagnosis  . HYPERCHOLESTEROLEMIA  . DEPRESSION  . HYPERTENSION  . PEPTIC ULCER DISEASE  . HIATAL HERNIA  . CONSTIPATION  . HEPATIC CYST  . HEMATEMESIS  . OSTEOARTHRITIS  . NAUSEA ALONE  . ABDOMINAL PAIN, CHRONIC  . Chest pain, unspecified  . Hypokalemia  . Bradycardia  . Giddiness  . Bilateral ankle fractures  .  Contusion of shoulder, right  . Ankle fracture  . Ankle sprain    PT - End of Session Equipment Utilized During Treatment: Gait belt Activity Tolerance: Patient tolerated treatment well General Behavior During Session: [facility] for tasks performed Cognition: [facility] for tasks performed  GP    Juel Burrow 05/25/2012, 4:05 PM

## 2012-05-30 ENCOUNTER — Ambulatory Visit (HOSPITAL_COMMUNITY)
Admission: RE | Admit: 2012-05-30 | Discharge: 2012-05-30 | Disposition: A | Discharge status: Home / Self Care | Payer: Self-pay | Source: Ambulatory Visit | Attending: Orthopedic Surgery | Admitting: Orthopedic Surgery

## 2012-05-30 DIAGNOSIS — M6281 Muscle weakness (generalized): Secondary | ICD-10-CM | POA: Insufficient documentation

## 2012-05-30 DIAGNOSIS — IMO0001 Reserved for inherently not codable concepts without codable children: Secondary | ICD-10-CM | POA: Insufficient documentation

## 2012-05-30 DIAGNOSIS — I10 Essential (primary) hypertension: Secondary | ICD-10-CM | POA: Insufficient documentation

## 2012-05-30 DIAGNOSIS — M25579 Pain in unspecified ankle and joints of unspecified foot: Secondary | ICD-10-CM | POA: Insufficient documentation

## 2012-05-30 DIAGNOSIS — M25673 Stiffness of unspecified ankle, not elsewhere classified: Secondary | ICD-10-CM | POA: Insufficient documentation

## 2012-05-30 DIAGNOSIS — R262 Difficulty in walking, not elsewhere classified: Secondary | ICD-10-CM | POA: Insufficient documentation

## 2012-05-30 DIAGNOSIS — M25676 Stiffness of unspecified foot, not elsewhere classified: Secondary | ICD-10-CM | POA: Insufficient documentation

## 2012-05-30 NOTE — Progress Notes (Signed)
Physical Therapy Treatment Patient Details  Name: [name] MRN: [id number] Date of Birth: 10-Jun-1946  Today's Date: 05/30/2012 Time: 0932-1015 PT Time Calculation (min): 43 min  Visit#: 4  of 12   Re-eval: 06/22/12 Charges:  therex 28', gait 10'  Subjective: Symptoms/Limitations Symptoms: Pt. brought SPC to treatment today.  States she's not having any pain today but has taken a pain pill (had a 2/10 prior to meds). Pain Assessment Currently in Pain?: No/denies   Exercise/Treatments Aerobic Stationary Bike: 6' @ 2.0 seat 8 Standing Heel Raises: 10 reps Heel Raises Limitations: Toe raises 10 x SLS: L: 10", R: 4" max of 3 Gait Training: Gait training with SPC, CGA w/ vc-ing for correct sequencing x 125' in 10 minutes Other Standing Knee Exercises: Tandem stance 2x 30" no HHA    Physical Therapy Assessment and Plan PT Assessment and Plan Clinical Impression Statement: Pt. with improved sequencing and general gait mechanics today using SPC using less assistance.  Noted weakness/ shaking in R LE with gait.  Pt. with personal goal to return to church X 2 weeks using SPC.  Advised to continue using RW outdoors until balance/strength improves. PT Plan: Continue gait training with SPC, low level balance activities and LE strengthening exercises to work towards goals.     Problem List Patient Active Problem List  Diagnosis  . HYPERCHOLESTEROLEMIA  . DEPRESSION  . HYPERTENSION  . PEPTIC ULCER DISEASE  . HIATAL HERNIA  . CONSTIPATION  . HEPATIC CYST  . HEMATEMESIS  . OSTEOARTHRITIS  . NAUSEA ALONE  . ABDOMINAL PAIN, CHRONIC  . Chest pain, unspecified  . Hypokalemia  . Bradycardia  . Giddiness  . Bilateral ankle fractures  . Contusion of shoulder, right  . Ankle fracture  . Ankle sprain      Lurena Nida, PTA/CLT 05/30/2012, 10:26 AM

## 2012-06-01 ENCOUNTER — Ambulatory Visit (HOSPITAL_COMMUNITY)
Admission: RE | Admit: 2012-06-01 | Discharge: 2012-06-01 | Disposition: A | Discharge status: Home / Self Care | Payer: Self-pay | Source: Ambulatory Visit | Attending: Orthopedic Surgery | Admitting: Orthopedic Surgery

## 2012-06-01 NOTE — Progress Notes (Signed)
Physical Therapy Treatment Patient Details  Name: [name] MRN: [id number] Date of Birth: 12/16/1945  Today's Date: 06/01/2012 Time: 0927-1020 PT Time Calculation (min): 53 min 34' therex 8' gait 10'ice   Visit#: 5  of 12   Re-eval: 06/22/12    Authorization:    Authorization Time Period:    Authorization Visit#:   of     Subjective: Symptoms/Limitations Symptoms: Pt c/o of L ankle swelling and 2/10 pain around ankle and in heel. Pain pill taken before appointment  Precautions/Restrictions     Exercise/Treatments Mobility/Balance        Stretches   Aerobic Stationary Bike: 6' @ 2.0 seat 8 Machines for Strengthening   Plyometrics   Standing Heel Raises: 15 reps Heel Raises Limitations: Toe raises x15 SLS: L: 5", R: 2" max of 3 Gait Training: Gait training with SPC, CGA w/ vc-ing for correct sequencing x 125' in 10 minutes Other Standing Knee Exercises: Tandem stance 2x 30" no HHA Seated Long Arc Quad: 20 reps (right) Other Seated Knee Exercises: hip flexion;right only (10 x2) Supine   Sidelying Hip ABduction: 20 reps Prone  Hamstring Curl: 15 reps (2#) Hip Extension: 20 reps   Ankle Stretches   Aerobic Exercises   Machines for Strengthening   Ankle Plyometrics   Ankle Exercises - Standing Other Standing Ankle Exercises: foam standing feet close together 1' x2;no hands Ankle Exercises - Seated   Ankle Exercises - Supine   Ankle Exercises - Sidelying   Modalities Modalities: Cryotherapy Cryotherapy Number Minutes Cryotherapy: 10 Minutes Cryotherapy Location: Ankle (left) Type of Cryotherapy: Ice pack  Physical Therapy Assessment and Plan PT Assessment and Plan Clinical Impression Statement: Patient continues to do well with general gait mechanics with SPC;no LOB.Add weights to LE strengthening exercises next treatment. Continue adding low level balance activities and ankle strengthening. Continue with PT plan of care. PT Plan:  continue with PT plan of care    Goals    Problem List Patient Active Problem List  Diagnosis  . HYPERCHOLESTEROLEMIA  . DEPRESSION  . HYPERTENSION  . PEPTIC ULCER DISEASE  . HIATAL HERNIA  . CONSTIPATION  . HEPATIC CYST  . HEMATEMESIS  . OSTEOARTHRITIS  . NAUSEA ALONE  . ABDOMINAL PAIN, CHRONIC  . Chest pain, unspecified  . Hypokalemia  . Bradycardia  . Giddiness  . Bilateral ankle fractures  . Contusion of shoulder, right  . Ankle fracture  . Ankle sprain    PT - End of Session Equipment Utilized During Treatment: Gait belt Activity Tolerance: Patient tolerated treatment well General Behavior During Session: [facility] for tasks performed Cognition: [facility] for tasks performed  GP    [name] 06/01/2012, 11:05 AM

## 2012-06-02 ENCOUNTER — Ambulatory Visit (HOSPITAL_COMMUNITY)
Admission: RE | Admit: 2012-06-02 | Discharge: 2012-06-02 | Disposition: A | Discharge status: Home / Self Care | Payer: Self-pay | Source: Ambulatory Visit | Attending: Family Medicine | Admitting: Family Medicine

## 2012-06-02 NOTE — Progress Notes (Signed)
Physical Therapy Treatment Patient Details  Name: SHIZUE KASEMAN MRN: [id number] Date of Birth: 05/23/1946  Today's Date: 06/02/2012 Time: 4098-1191 PT Time Calculation (min): 72 min 15' ice 8' gait 30' therex 10' self care  Visit#: 6  of 12   Re-eval: 06/22/12    Authorization:    Authorization Time Period:    Authorization Visit#:   of     Subjective: Symptoms/Limitations Symptoms: I took pain medicine this morning, so I am not in pain, just a little sore in bith heels  Precautions/Restrictions     Exercise/Treatments Mobility/Balance        Stretches   Aerobic Stationary Bike: 6' @ 2.0 seat 8 Machines for Strengthening   Plyometrics   Standing Heel Raises: 15 reps Heel Raises Limitations: Toe raises x15 SLS: L: 5", R: 2" max of 3 Gait Training: Gait training with SPC, CGA w/ vc-ing for correct sequencing x 100' in 7 minutes Other Standing Knee Exercises: Tandem stance 2x 30" no HHA Seated Long Arc Quad: 20 reps (2#) Other Seated Knee Exercises: hip flexion;right only Supine   Sidelying Hip ABduction: 20 reps (2#) Prone  Hamstring Curl: 15 reps (2#) Hip Extension: 20 reps (R;no weight L;2#)   Ankle Stretches   Aerobic Exercises   Machines for Strengthening   Ankle Plyometrics   Ankle Exercises - Standing   Ankle Exercises - Seated   Ankle Exercises - Supine   Ankle Exercises - Sidelying      Physical Therapy Assessment and Plan PT Assessment and Plan Clinical Impression Statement: Patient continues to do well with general gait mechanics with SPC;however SLS still very poor only able to stand on L 5" and R 2" the last two sessions. Sent patient home with HEP exercise for balance and instructed patient on use of shower bench. Continue increase distance and dynamic surfaces with SPC. Continue with Pt plan of care. PT Plan: continue with PT plan of care    Goals    Problem List Patient Active Problem List  Diagnosis  .  HYPERCHOLESTEROLEMIA  . DEPRESSION  . HYPERTENSION  . PEPTIC ULCER DISEASE  . HIATAL HERNIA  . CONSTIPATION  . HEPATIC CYST  . HEMATEMESIS  . OSTEOARTHRITIS  . NAUSEA ALONE  . ABDOMINAL PAIN, CHRONIC  . Chest pain, unspecified  . Hypokalemia  . Bradycardia  . Giddiness  . Bilateral ankle fractures  . Contusion of shoulder, right  . Ankle fracture  . Ankle sprain    PT - End of Session Equipment Utilized During Treatment: Gait belt Activity Tolerance: Patient tolerated treatment well General Behavior During Session: [facility] for tasks performed Cognition: [facility] for tasks performed  GP    Keora Eccleston ATKINSO 06/02/2012, 11:01 AM

## 2012-06-05 ENCOUNTER — Ambulatory Visit (HOSPITAL_COMMUNITY)
Admission: RE | Admit: 2012-06-05 | Discharge: 2012-06-05 | Disposition: A | Discharge status: Home / Self Care | Payer: Self-pay | Source: Ambulatory Visit | Attending: Family Medicine | Admitting: Family Medicine

## 2012-06-05 NOTE — Progress Notes (Addendum)
Physical Therapy Treatment Patient Details  Name: [name] MRN: [id number] Date of Birth: August 13, 1946  Today's Date: 06/05/2012 Time: 0925-1027 PT Time Calculation (min): 62 min Charges: 30' NMR, 15' Manual, 1 ice Visit#: 7  of 12   Re-eval: 06/22/12   Subjective: Symptoms/Limitations Symptoms: Pt reports that she is a little achy this morning.  She states she has not attempted to stand to cook or clean yet.  She has not been out to attend church.   Precautions/Restrictions     Exercise/Treatments Aerobic Stationary Bike: 6' @ 2.0 seat 8 Standing Gait Training: w/SPC in dept and outdoors on [place] x 8' total.  VC for posture and gaze.  Other Standing Knee Exercises: 6 in. Hurdles w/alternating feet w/min A x3 RT Other Standing Knee Exercises: Numbers 1-15 2x (1xw/RUE and 1xw/LUE) on balance beam w/min A  Modalities Modalities: Cryotherapy Manual Therapy Manual Therapy: Joint mobilization Joint Mobilization: Grade I-IV to B calcaneous to improve ROM and decrease pain. x15'  Cryotherapy Cryotherapy Location: Ankle (left)  Physical Therapy Assessment and Plan PT Assessment and Plan Clinical Impression Statement: Treatment consisted of improving overall confidence with SPC and balance.   PT Plan: F/U w/manual techniques.  COntinue to improve confidence with balance and outdoor ambulation.      Goals    Problem List Patient Active Problem List  Diagnosis  . HYPERCHOLESTEROLEMIA  . DEPRESSION  . HYPERTENSION  . PEPTIC ULCER DISEASE  . HIATAL HERNIA  . CONSTIPATION  . HEPATIC CYST  . HEMATEMESIS  . OSTEOARTHRITIS  . NAUSEA ALONE  . ABDOMINAL PAIN, CHRONIC  . Chest pain, unspecified  . Hypokalemia  . Bradycardia  . Giddiness  . Bilateral ankle fractures  . Contusion of shoulder, right  . Ankle fracture  . Ankle sprain    PT Plan of Care PT Patient Instructions: Encouraged to continue with SPC ambulation in her house to improve her confidence  and LE strength.  Educated to start a standing program beginning at 5 minutes and moving up everyday or every other day by 1 minute.  Consulted and Agree with Plan of Care: Patient  GP    [employer], PT 06/05/2012, 10:20 AM

## 2012-06-07 ENCOUNTER — Ambulatory Visit (HOSPITAL_COMMUNITY)
Admission: RE | Admit: 2012-06-07 | Discharge: 2012-06-07 | Disposition: A | Discharge status: Home / Self Care | Payer: Self-pay | Source: Ambulatory Visit | Attending: Orthopedic Surgery | Admitting: Orthopedic Surgery

## 2012-06-07 NOTE — Progress Notes (Signed)
Physical Therapy Treatment Patient Details  Name: [name] MRN: [id number] Date of Birth: [birth year]  Today's Date: 06/07/2012 Time: 4098-1191 (Tx started by Emeline Gins, PTA) PT Time Calculation (min): 56 min  Visit#: 8  of 12   Re-eval: 06/22/12 Assessment Diagnosis: R ankle fracture, L ankle sprain Next MD Visit: Dr. Romeo Apple   Charges: Gait x 15' Manual x 18' Ice x 10'  Subjective: Symptoms/Limitations Symptoms: Pt. states she had a rough afternoon because she did too much yesterday.  States she cooked, cleaned and went to the grocery store; got in bed at 5pm, took a pain pill and did not get up until this morning.  Currently 6/10 without pain meds. Pain Assessment Currently in Pain?: Yes Pain Score:   6 Pain Location: Ankle Pain Orientation: Right;Left  Precautions/Restrictions  Precautions Precautions: None Precaution Comments: WBAT does not need CAM boot  Exercise/Treatments Aerobic Stationary Bike: 6' @ 2.0 seat 8 Standing SLS: L: 8", R: 4" max of 3 Gait Training: w/SPC on even ground working on equalizing step length and wt distribution x 15'   Physical Therapy Assessment and Plan PT Assessment and Plan Clinical Impression Statement: Standing exercises limited by pain this session. Pt requires cueing to equalize stance time and step length. Tx focus on improving gait mechanics and decreasing pain. Manual techniques completed to B ankles. Multiple spasms and adhesions noted in B ankles. These decreased with manual techniques. Ice applied to B ankles at end of session. Pt reports pain decrease to 0/10 at end of session. PT Plan: Continue to improve confidence with balance and outdoor ambulation per PT POC.     Problem List Patient Active Problem List  Diagnosis  . HYPERCHOLESTEROLEMIA  . DEPRESSION  . HYPERTENSION  . PEPTIC ULCER DISEASE  . HIATAL HERNIA  . CONSTIPATION  . HEPATIC CYST  . HEMATEMESIS  . OSTEOARTHRITIS  . NAUSEA ALONE  .  ABDOMINAL PAIN, CHRONIC  . Chest pain, unspecified  . Hypokalemia  . Bradycardia  . Giddiness  . Bilateral ankle fractures  . Contusion of shoulder, right  . Ankle fracture  . Ankle sprain    PT - End of Session Activity Tolerance: Patient tolerated treatment well;Patient limited by pain General Behavior During Session: [facility] for tasks performed Cognition: [facility] for tasks performed PT Plan of Care PT Patient Instructions: Pt advised to ice B ankles throughout the day, especially after standing for long periods. Also advised pt to gradually build up her tolerance for standing activities, rather than standing for most of the day as she did yesterday.  Seth Bake, PTA 06/07/2012, 12:01 PM

## 2012-06-08 ENCOUNTER — Telehealth: Payer: Self-pay | Admitting: Orthopedic Surgery

## 2012-06-08 ENCOUNTER — Ambulatory Visit (HOSPITAL_COMMUNITY)
Admission: RE | Admit: 2012-06-08 | Discharge: 2012-06-08 | Disposition: A | Discharge status: Home / Self Care | Payer: Self-pay | Source: Ambulatory Visit | Attending: Orthopedic Surgery | Admitting: Orthopedic Surgery

## 2012-06-08 NOTE — Progress Notes (Signed)
Physical Therapy Treatment Patient Details  Name: [name] MRN: [id number] Date of Birth: 11/04/1945  Today's Date: 06/08/2012 Time: 0935-1022 PT Time Calculation (min): 47 min Charges:  therex 18', gait 10', manual 8', ice 10' B ankles Visit#: 9  of 12   Re-eval: 06/22/12    Subjective: Symptoms/Limitations Symptoms: Pt. states she's not as sore today.  States she's been practicing standing at home without AD; walking with SPC in house.   States she had to take a pain pill this morning and currently has no pain. Pain Assessment Currently in Pain?: No/denies   Exercise/Treatments Aerobic Stationary Bike: 6' @ 2.0 seat 8 Standing Heel Raises: 15 reps Heel Raises Limitations: Toe raises x15 Lateral Step Up: 10 reps;Both;Step Height: 4";Hand Hold: 2 Forward Step Up: 10 reps;Both;Hand Hold: 1;Step Height: 2" SLS: L: 6", R: 5" max of 3 Gait Training: raining today; amb indoors with SPC X 340'   Modalities Modalities: Cryotherapy Manual Therapy Manual Therapy: Joint mobilization Joint Mobilization: Grade I-IV to B calcaneous to improve ROM and decrease pain. x8'  Cryotherapy Number Minutes Cryotherapy: 10 Minutes Cryotherapy Location: Ankle (Bilateral) Type of Cryotherapy: Ice pack  Physical Therapy Assessment and Plan PT Assessment and Plan Clinical Impression Statement: Able to resume standing activities; added step ups to begin working toward negotiation of stairs.  Pain reported with 4" step up but none with 2" and able to complete.  Noted restrictions with R DF and eversion, L ankle with good motion in all planes.  Overall improved comfort using SPC indoors, however still not comfortable outdoors. PT Plan: Continue to improve confidence with gait outdoors using SPC, improve balance and add gastroc stretch with slant board.     Problem List Patient Active Problem List  Diagnosis  . HYPERCHOLESTEROLEMIA  . DEPRESSION  . HYPERTENSION  . PEPTIC ULCER  DISEASE  . HIATAL HERNIA  . CONSTIPATION  . HEPATIC CYST  . HEMATEMESIS  . OSTEOARTHRITIS  . NAUSEA ALONE  . ABDOMINAL PAIN, CHRONIC  . Chest pain, unspecified  . Hypokalemia  . Bradycardia  . Giddiness  . Bilateral ankle fractures  . Contusion of shoulder, right  . Ankle fracture  . Ankle sprain    PT - End of Session Activity Tolerance: Patient tolerated treatment well;Patient limited by pain General Behavior During Session: [facility] for tasks performed Cognition: [facility] for tasks performed PT Plan of Care PT Patient Instructions: Pt advised to ice B ankles throughout the day, especially after standing for long periods. Also advised pt to gradually build up her tolerance for standing activities and try shifting her weight while standing.   Lurena Nida, PTA/CLT 06/08/2012, 10:38 AM

## 2012-06-08 NOTE — Telephone Encounter (Signed)
Patient called, states physical therapist at [facility] mentioned that she should have support hose, and that a prescription would be needed from Dr. Romeo Apple for ordering through [facility].  Please advise.  Patient ph# is [phone].

## 2012-06-12 ENCOUNTER — Ambulatory Visit (HOSPITAL_COMMUNITY)
Admission: RE | Admit: 2012-06-12 | Discharge: 2012-06-12 | Disposition: A | Discharge status: Home / Self Care | Payer: Self-pay | Source: Ambulatory Visit | Attending: Orthopedic Surgery | Admitting: Orthopedic Surgery

## 2012-06-12 NOTE — Telephone Encounter (Signed)
dont think this is needed

## 2012-06-12 NOTE — Progress Notes (Addendum)
Physical Therapy Treatment Patient Details  Name: ELLOWYN JEFFORDS MRN: [id number] Date of Birth: 02-29-1946  Today's Date: 06/12/2012 Time: 0930-1020 PT Time Calculation (min): 50 min Charges: TA x23', NMR x 8, Self Care: x15 Visit#: 10  of 12   Re-eval: 06/22/12   Subjective: Symptoms/Limitations Symptoms: Pt reports that she is trying hard to make it past standing for greater than 5 minutes, but is having a hard time doing so due to pain.  She reports that her R ankle has had a 'soft pocket" on the outside of her foot, but it has gone down since yesterday.  She reports that she has been walking a lot with her cane in the house.  Pain Assessment Currently in Pain?: Yes Pain Score:   3 Pain Location: Ankle Pain Orientation: Right  Objective: R ankle special tests: - Talar tilt, -Compression, - anterior drawer.  Pain and tenderness with palpation over medial fat pad with mild swelling.  Discussed continuing with ice and common frequency and duration of swelling after ankle injuries.   Exercise/Treatments Ankle Exercises - Standing Other Standing Ankle Exercises: Gait w/SPC w/supervision and cueing for motivation and posture x10 minutes Other Standing Ankle Exercises: TA: Folding towels and walking w/SPC x11 minutes total Standing Numbers 1-15: 2 reps;Balance Beam;Limitations (w/min A- CGA) Numbers 1-15 Limitations: W/independent ambulation w/min A after.  Total 8 minutes   Physical Therapy Assessment and Plan PT Assessment and Plan Clinical Impression Statement: Focused on improving activity tolerance.  Able to complete all exercises in standing position (other than bike) ambulated up to seconday floor to see family memeber w/SPC and supervision.   PT Plan: Focus on pt HEP.  Demo: ankle 4 way w/t-band, foot in/ev, towel crunches.  Encourage outdoor ambulation.      Goals    Problem List Patient Active Problem List  Diagnosis  . HYPERCHOLESTEROLEMIA  . DEPRESSION  .  HYPERTENSION  . PEPTIC ULCER DISEASE  . HIATAL HERNIA  . CONSTIPATION  . HEPATIC CYST  . HEMATEMESIS  . OSTEOARTHRITIS  . NAUSEA ALONE  . ABDOMINAL PAIN, CHRONIC  . Chest pain, unspecified  . Hypokalemia  . Bradycardia  . Giddiness  . Bilateral ankle fractures  . Contusion of shoulder, right  . Ankle fracture  . Ankle sprain    PT - End of Session Activity Tolerance: Patient tolerated treatment well;Patient limited by fatigue General Behavior During Session: [facility] for tasks performed Cognition: [facility] for tasks performed  Jhostin Epps, PT 06/12/2012, 10:23 AM

## 2012-06-13 NOTE — Telephone Encounter (Signed)
Called back to patient, relayed. 

## 2012-06-14 ENCOUNTER — Ambulatory Visit (HOSPITAL_COMMUNITY)
Admission: RE | Admit: 2012-06-14 | Discharge: 2012-06-14 | Disposition: A | Discharge status: Home / Self Care | Payer: Self-pay | Source: Ambulatory Visit | Attending: Orthopedic Surgery | Admitting: Orthopedic Surgery

## 2012-06-14 NOTE — Progress Notes (Signed)
Physical Therapy Treatment Patient Details  Name: [name] MRN: [id number] Date of Birth: 1945-09-12  Today's Date: 06/14/2012 Time: 0930-1030 PT Time Calculation (min): 60 min  Visit#: 11  of 12   Re-eval: 06/22/12  Charge: Gait 15', therex 35', ice 10'  Subjective: Symptoms/Limitations Symptoms: Pt reports ankles more sore than painful, pain scale 2/10 L >R   Pt to stand leaning against counter for 30 minutes last night to cook and clean. Pain Assessment Currently in Pain?: Yes Pain Score:   2 Pain Location: Ankle Pain Orientation: Left;Right (L>R)  Objective:   Exercise/Treatments Aerobic Exercises Stationary Bike: 6' @ 2.0 Ankle Exercises - Standing Other Standing Ankle Exercises: Outdoor gait training with SPC w/ SBA incline slopes thruough grass x 15 minutes Ankle Exercises - Seated Towel Crunch: 1 rep;Limitations Towel Crunch Limitations: BLE Towel Inversion/Eversion: 1 rep;Limitations Towel Inversion/Eversion Limitations: BLE Marble Pickup: 2 sets 5 glass marbles BLE Ankle Exercises - Supine T-Band: next session Ankle Exercises - Sidelying   Modalities Modalities: Cryotherapy Cryotherapy Number Minutes Cryotherapy: 10 Minutes Cryotherapy Location: Ankle (Bilateral) Type of Cryotherapy: Ice pack  Physical Therapy Assessment and Plan PT Assessment and Plan Clinical Impression Statement: Began gait training outdoors with incline/decline slopes and through grass with SBA with no LOB episodes x 15 minutes.  Pt with appropriate gait mechanics, min cueing for posture.  Pt expressed  increased convidence with outdoor gait and encouraged to walk to car with family member rather than have car brought to yard, pt stated she would attempt.  Added towel crunch and marbel pick for intrinsic strengthening and inv/eversion with knee stabilized by therapist for more ankle movement, pt with more difficulty left ankle than right.   PT Plan: Re-assess next session,  continue with current POC for ankle strengthening.  Begin 4-way tband next session and progress to step/stair training when appropriate.      Goals    Problem List Patient Active Problem List  Diagnosis  . HYPERCHOLESTEROLEMIA  . DEPRESSION  . HYPERTENSION  . PEPTIC ULCER DISEASE  . HIATAL HERNIA  . CONSTIPATION  . HEPATIC CYST  . HEMATEMESIS  . OSTEOARTHRITIS  . NAUSEA ALONE  . ABDOMINAL PAIN, CHRONIC  . Chest pain, unspecified  . Hypokalemia  . Bradycardia  . Giddiness  . Bilateral ankle fractures  . Contusion of shoulder, right  . Ankle fracture  . Ankle sprain    PT - End of Session Equipment Utilized During Treatment: Gait belt Activity Tolerance: Patient tolerated treatment well General Behavior During Session: [facility] for tasks performed Cognition: [facility] for tasks performed  GP    Juel Burrow 06/14/2012, 10:56 AM

## 2012-06-16 ENCOUNTER — Ambulatory Visit (HOSPITAL_COMMUNITY)
Admission: RE | Admit: 2012-06-16 | Discharge: 2012-06-16 | Disposition: A | Discharge status: Home / Self Care | Payer: Self-pay | Source: Ambulatory Visit | Attending: Family Medicine | Admitting: Family Medicine

## 2012-06-16 NOTE — Evaluation (Signed)
Physical Therapy Re-Evaluation  Patient Details  Name: [name] MRN: [id number] Date of Birth: 04/23/1946  Today's Date: 06/16/2012 Time: 4098-1191 PT Time Calculation (min): 40 min Charges: 1 MMT, 1 ROM, 15' TE, 15' Self care Visit#: 12  of 16   Re-eval: 06/30/12 Assessment Diagnosis: R ankle fracture, L ankle sprain Next MD Visit: Dr. Warrick Parisian 18th  Subjective Symptoms/Limitations Symptoms: Pt reports that she is doing pretty well.   How long can you stand comfortably?: She was standing to wash dishes, fold clothes, made dinner, and made the bed,   How long can you walk comfortably?: Is walking with a SPC indoors and needs RW when it is wet outside.   Assessment RLE AROM (degrees) Right Ankle Dorsiflexion: 10  (past neutral, was 10 from neutral) Right Ankle Plantar Flexion: 45  (was 37) Right Ankle Inversion: 25  (was 22) Right Ankle Eversion: 30  (was 25) RLE Strength Right Hip Flexion:  (4+/5, was 3+/5) Right Hip Extension: 3/5 (was 2/5) Right Hip ABduction: 4/5 Right Knee Flexion:  (4+/5. was 4/5) Right Knee Extension: 5/5 (was 3+/5) Right Ankle Dorsiflexion: 5/5 (was 4/5) Right Ankle Plantar Flexion: 4/5 (was 4/5) Right Ankle Inversion: 4/5 (was 4/5) Right Ankle Eversion: 4/5 (was 4/5) LLE AROM (degrees) Left Ankle Dorsiflexion: 15  (past neutral, was 5 from neutral) Left Ankle Plantar Flexion: 50  Left Ankle Inversion: 40  (was 40) Left Ankle Eversion: 20  (was 10) LLE Strength Left Hip Flexion: 5/5 Left Hip Extension: 4/5 (was 3/5) Left Hip ABduction: 4/5 (was 3+/5) Left Knee Flexion: 5/5 Left Knee Extension: 5/5 Left Ankle Dorsiflexion: 4/5 Left Ankle Plantar Flexion: 5/5 Left Ankle Inversion: 5/5 (was 5/5) Left Ankle Eversion: 4/5 (was 4/5)  Mobility/Balance  Ambulation/Gait Ambulation/Gait: Yes Ambulation/Gait Assistance: 5: Supervision Assistive device: Straight cane Gait Pattern: Antalgic;Right foot flat;Left foot  flat Gait velocity: decreased Static Standing Balance Single Leg Stance - Right Leg: 3  Single Leg Stance - Left Leg: 13    Exercise/Treatments Standing Stairs: therapy gym stairs 2 RT w/1 handrail and SPC,  demo forward and sideways stair climbing Gait Training: Grass, concrete and curb x8 minutes w/SPC and supervision Ankle Exercises - Supine T-Band: Green T-band BLE x20 all 4 directions  Physical Therapy Assessment and Plan PT Assessment and Plan Clinical Impression Statement: [name] has attended 12 OP PT visits to s/p B ankle injuries after a fall with the following findings: she has met 2/4 STG and is progressing toward 3/3 LTG.  Her greatest challenge remains increased fear with ambulation. She has improved her strength to [facility] overall however continues to be limited with some ankle strength.  She is currently ambulating with a SPC in her household and with some outdoor, however remains fearful of falling while using it on outdoor surfaces.  Today trained with SPC to ascend and descend stairs on grass and curbs w/supervision level to improve confidence  Pt will benefit from skilled therapeutic intervention in order to improve on the following deficits: Difficulty walking;Decreased strength;Decreased range of motion;Decreased balance Rehab Potential: Good PT Frequency: Min 2X/week PT Duration: Other (comment) (2 weeks) PT Treatment/Interventions: Gait training PT Plan: Focus next session on Ther-ex for ankle strength and improve overall balance. Continue to encourage outdoor ambulation and improve confidence.     Goals Home Exercise Program Pt will Perform Home Exercise Program: Independently PT Goal: Perform Home Exercise Program - Progress: Progressing toward goal (working on endurace at home and completing ADL's) PT Short Term Goals Time to  Complete Short Term Goals: 3 weeks PT Short Term Goal 1: Pt will improve her LE strength in order to ambulate x5 minutes without RW in  closed environment PT Short Term Goal 1 - Progress: Met PT Short Term Goal 2: Pt will improve her R and L balance and demo SLS x10 sec on static surface. PT Short Term Goal 2 - Progress: Partly met (13 sec on L, 3 sec on R) PT Short Term Goal 3: Pt will improve her ankle ROM by 5 degrees in each direction  PT Short Term Goal 3 - Progress: Met PT Long Term Goals Time to Complete Long Term Goals: Other (comment) (6 weeks) PT Long Term Goal 1: Pt will improve her LE strength and ambulate independently in indoor and outdoor enviroments x10 minutes.  PT Long Term Goal 1 - Progress: Progressing toward goal PT Long Term Goal 2: Pt will improve her B ankle ROM to [facility] in order to ascend and descend 10 stairs w/1 handrail with reciprocal pattern in order to enter community locations.  PT Long Term Goal 2 - Progress: Progressing toward goal Long Term Goal 3: Pt will improve her dynamic balance and ambulate with supervision on uneven surfaces to attend to her outdoor gardening and church related activities.  Long Term Goal 3 Progress: Progressing toward goal  Problem List Patient Active Problem List  Diagnosis  . HYPERCHOLESTEROLEMIA  . DEPRESSION  . HYPERTENSION  . PEPTIC ULCER DISEASE  . HIATAL HERNIA  . CONSTIPATION  . HEPATIC CYST  . HEMATEMESIS  . OSTEOARTHRITIS  . NAUSEA ALONE  . ABDOMINAL PAIN, CHRONIC  . Chest pain, unspecified  . Hypokalemia  . Bradycardia  . Giddiness  . Bilateral ankle fractures  . Contusion of shoulder, right  . Ankle fracture  . Ankle sprain    PT - End of Session Equipment Utilized During Treatment: Gait belt Activity Tolerance: Patient tolerated treatment well General Behavior During Session: [facility] for tasks performed Cognition: [facility] for tasks performed PT Plan of Care PT Patient Instructions: Encouraged pt to use [facility] and go to  her back porch and use supervision while going outside this weekend.  Consulted and Agree with Plan of Care: Patient;Family  member/caregiver Family Member Consulted: husband  Annett Fabian, PT 06/16/2012, 12:21 PM  Physician Documentation Your signature is required to indicate approval of the treatment plan as stated above.  Please sign and either send electronically or make a copy of this report for your files and return this physician signed original.   Please mark one 1.__approve of plan  2. ___approve of plan with the following conditions.   ______________________________                                                          _____________________ Physician Signature  Date  

## 2012-06-19 ENCOUNTER — Ambulatory Visit (HOSPITAL_COMMUNITY): Payer: Self-pay | Admitting: Physical Therapy

## 2012-06-21 ENCOUNTER — Ambulatory Visit (HOSPITAL_COMMUNITY)
Admission: RE | Admit: 2012-06-21 | Discharge: 2012-06-21 | Disposition: A | Discharge status: Home / Self Care | Payer: Self-pay | Source: Ambulatory Visit | Attending: Orthopedic Surgery | Admitting: Orthopedic Surgery

## 2012-06-21 NOTE — Progress Notes (Signed)
Physical Therapy Treatment Patient Details  Name: [name] MRN: [id number] Date of Birth: Jan 13, 1946  Today's Date: 06/21/2012 Time: 0865-7846 PT Time Calculation (min): 45 min Charges: 35' TE, 10' TA Visit#: 13  of 16   Re-eval: 06/30/12   Subjective: Symptoms/Limitations Symptoms: Pt reports that she did a lot of walking this weekend.  She was unable to go out to her porch because of the rain, however was able to go to church, grocery store and went to visit her brother over in the [facility].  She has also been able to cook.  She has returned her WC.  She remains concerned that she is still very slow with all her actvitites.  Pain Assessment Currently in Pain?: Yes Pain Score:   2 Pain Location: Ankle Pain Orientation: Left;Right (L=R)  Exercise/Treatments Ankle Exercises - Standing Other Standing Ankle Exercises: Retro and Tandem Gait 1 RT each Other Standing Ankle Exercises: Gait training 4 RT w/ Ankle Exercises - Seated Towel Crunch: 3 reps Towel Crunch Limitations: BLE Towel Inversion/Eversion: 5 reps;Weights;Limitations Towel Inversion/Eversion Weights (lbs): 3 Towel Inversion/Eversion Limitations: BLE Ankle Exercises - Supine T-Band: Red given x20 each direction BLE Instruction and demonstration on proper mechanics for donning/doffing LE dressing.  Physical Therapy Assessment and Plan PT Assessment and Plan Clinical Impression Statement: Pt continues to limited by overall fear of falling and decreased activity tolerance.  Today's session encouraged pt to move out of her comfort in order to move weights to the towel and independently don and doff LE clothing.  Pt is limited due to impaired strength and flexibility.  PT Duration:  (2 weeks) PT Plan: Focus next session on Ther-ex for ankle strength and improve overall balance. Continue to encourage outdoor ambulation and improve confidence.     Goals    Problem List Patient Active Problem List    Diagnosis  . HYPERCHOLESTEROLEMIA  . DEPRESSION  . HYPERTENSION  . PEPTIC ULCER DISEASE  . HIATAL HERNIA  . CONSTIPATION  . HEPATIC CYST  . HEMATEMESIS  . OSTEOARTHRITIS  . NAUSEA ALONE  . ABDOMINAL PAIN, CHRONIC  . Chest pain, unspecified  . Hypokalemia  . Bradycardia  . Giddiness  . Bilateral ankle fractures  . Contusion of shoulder, right  . Ankle fracture  . Ankle sprain    PT - End of Session Activity Tolerance: Patient tolerated treatment well General Behavior During Session: [facility] for tasks performed Cognition: [facility] for tasks performed PT Plan of Care PT Home Exercise Plan: red t-band given to pt. and updated with ankle exercises.  PT Patient Instructions: encourageed to continue with community ambulation and improve gait speed. Encouraged to conitnue with independent dressing and activities.  Consulted and Agree with Plan of Care: Patient  [name], PT 06/21/2012, 12:00 PM

## 2012-06-23 ENCOUNTER — Ambulatory Visit (HOSPITAL_COMMUNITY)
Admission: RE | Admit: 2012-06-23 | Discharge: 2012-06-23 | Disposition: A | Discharge status: Home / Self Care | Payer: Self-pay | Source: Ambulatory Visit | Attending: Orthopedic Surgery | Admitting: Orthopedic Surgery

## 2012-06-23 NOTE — Progress Notes (Signed)
Physical Therapy Treatment Patient Details  Name: MITALI SHENEFIELD MRN: [id number] Date of Birth: 1946/03/25  Today's Date: 06/23/2012 Time: 1113-1210 PT Time Calculation (min): 57 min Charges: TE x 30 minutes, Gait x 10, 1 ice Visit#: 13  of 16   Re-eval: 06/30/12 Assessment Diagnosis: R ankle fracture, L ankle sprain Next MD Visit: Dr. Warrick Parisian 18th  Authorization:    Authorization Time Period:    Authorization Visit#:   of     Subjective: Symptoms/Limitations Symptoms: Pt reports that she has been walking around everywhere with her cane.  She has been able to go out in the community with her [facility] and is limited mostly by her activitiy tolerance.  She would like to walk independently.  She feels about 90% better.  How long can you walk comfortably?: walking in the [employer] 30 minutes with a shopping cart.  Pain Assessment Currently in Pain?: Yes Pain Score:   2 (soreness to her heel. )  Precautions/Restrictions     Exercise/Treatments Mobility/Balance        Ankle Stretches Soleus Stretch: 3 reps;30 seconds (BLE) Slant Board Stretch: 3 reps;30 seconds Aerobic Exercises   Machines for Strengthening   Ankle Plyometrics   Ankle Exercises - Standing SLS: 3x30 sec w/intermittent HHA, BLE Other Standing Ankle Exercises: Gait 2x4' w/manual facilition for posture and arm swing Ankle Exercises - Seated Towel Crunch: 3 reps (BLE) Towel Inversion/Eversion: 5 reps Towel Inversion/Eversion Weights (lbs): 3 Towel Inversion/Eversion Limitations: BLE Other Seated Ankle Exercises: Heel Roll outs 5x10 sec holds Ankle Exercises - Supine   Ankle Exercises - Sidelying   Modalities Modalities: Cryotherapy Cryotherapy Number Minutes Cryotherapy: 10 Minutes Cryotherapy Location: Ankle (Bilateral) Type of Cryotherapy: Ice pack  Physical Therapy Assessment and Plan PT Assessment and Plan Clinical Impression Statement: Pt continues to be limited by her fear of  falling and requires max multifmodal cueing for proper posture, arm swimg and to decrease R foot toe out. Pt had obvious fatigue as she felt faint after 30 minutes of activity.  PT Duration:  (2 weeks)    Goals Home Exercise Program Pt will Perform Home Exercise Program: Independently PT Goal: Perform Home Exercise Program - Progress: Met PT Short Term Goals Time to Complete Short Term Goals: 3 weeks PT Short Term Goal 1: Pt will improve her LE strength in order to ambulate x5 minutes without RW in closed environment PT Short Term Goal 1 - Progress: Met PT Short Term Goal 2: Pt will improve her R and L balance and demo SLS x10 sec on static surface. PT Short Term Goal 3: Pt will improve her ankle ROM by 5 degrees in each direction  PT Short Term Goal 3 - Progress: Met PT Long Term Goals Time to Complete Long Term Goals: Other (comment) (6 weeks) PT Long Term Goal 1: Pt will improve her LE strength and ambulate independently in indoor and outdoor enviroments x10 minutes.  PT Long Term Goal 1 - Progress: Progressing toward goal (4 minutes w/mod cueing ) PT Long Term Goal 2: Pt will improve her B ankle ROM to [facility] in order to ascend and descend 10 stairs w/1 handrail with reciprocal pattern in order to enter community locations.  Long Term Goal 3: Pt will improve her dynamic balance and ambulate with supervision on uneven surfaces to attend to her outdoor gardening and church related activities.   Problem List Patient Active Problem List  Diagnosis  . HYPERCHOLESTEROLEMIA  . DEPRESSION  . HYPERTENSION  . PEPTIC ULCER DISEASE  .  HIATAL HERNIA  . CONSTIPATION  . HEPATIC CYST  . HEMATEMESIS  . OSTEOARTHRITIS  . NAUSEA ALONE  . ABDOMINAL PAIN, CHRONIC  . Chest pain, unspecified  . Hypokalemia  . Bradycardia  . Giddiness  . Bilateral ankle fractures  . Contusion of shoulder, right  . Ankle fracture  . Ankle sprain    PT - End of Session Activity Tolerance: Patient tolerated  treatment well General Behavior During Session: [facility] for tasks performed Cognition: [facility] for tasks performed PT Plan of Care Consulted and Agree with Plan of Care: Patient  GP    Adil Tugwell 06/23/2012, 12:13 PM

## 2012-06-27 ENCOUNTER — Ambulatory Visit (HOSPITAL_COMMUNITY)
Admission: RE | Admit: 2012-06-27 | Discharge: 2012-06-27 | Disposition: A | Discharge status: Home / Self Care | Payer: Self-pay | Source: Ambulatory Visit | Attending: Orthopedic Surgery | Admitting: Orthopedic Surgery

## 2012-06-27 NOTE — Progress Notes (Signed)
Physical Therapy Treatment Patient Details  Name: [name] MRN: [id number] Date of Birth: [birth year]  Today's Date: 06/27/2012 Time: 1055-1150 PT Time Calculation (min): 55 min Charges:  therex 40', ice 10' Visit#: 14  of 16   Re-eval: 06/30/12 Diagnosis: R ankle fracture, L ankle sprain Next MD Visit: Dr. Harrison-November 19th   Subjective: Symptoms/Limitations Symptoms: Pt. states she's been hurting worse, however been up/walking more over the weekend; states her L ankle is hurting worse than her R ankle. Pain Assessment Currently in Pain?: Yes Pain Score:   7 Pain Location: Ankle Pain Orientation: Left;Right (L>R; R ankle is 3/10 today)   Exercise/Treatments Ankle Stretches Soleus Stretch: 3 reps;30 seconds;Limitations Soleus Stretch Limitations: bilateral Animal nutritionist Stretch: 3 reps;30 seconds;Limitations Slant Board Stretch Limitations: bilateral Ankle Exercises - Standing Vector Stance: 5 reps;5 seconds;Limitations Vector Stance Limitations: R only SLS: L:4", R:10" max of 3 Other Standing Ankle Exercises: lateral and forward step ups 4" 1 HHA 10 reps each Ankle Exercises - Seated Towel Crunch: 3 reps Towel Crunch Limitations: BLE Towel Inversion/Eversion: 5 reps;Limitations Towel Inversion/Eversion Weights (lbs): 3 Towel Inversion/Eversion Limitations: R Only Other Seated Ankle Exercises: Heel Roll outs 5x10 sec holds   Modalities Modalities: Cryotherapy Cryotherapy Cryotherapy Location: Ankle (Bilateral) Type of Cryotherapy: Ice pack  Physical Therapy Assessment and Plan PT Assessment and Plan Clinical Impression Statement: Pt. with increased pain in L ankle; increased swelling noted.  Unable to complete standing/stab exercises on L LE today and too painful to perform seated inv/ev with towel today.  Pt. instructed to rest L ankle X 2 days and if no better will need to return to MD. Pt. did alot of walking in community with [facility]; no AD in her  home over the weekend. PT Duration:  (2 weeks) PT Plan: Re-evaluate next visit; assess pain in L ankle and whether has decreased/resolved.     Problem List Patient Active Problem List  Diagnosis  . HYPERCHOLESTEROLEMIA  . DEPRESSION  . HYPERTENSION  . PEPTIC ULCER DISEASE  . HIATAL HERNIA  . CONSTIPATION  . HEPATIC CYST  . HEMATEMESIS  . OSTEOARTHRITIS  . NAUSEA ALONE  . ABDOMINAL PAIN, CHRONIC  . Chest pain, unspecified  . Hypokalemia  . Bradycardia  . Giddiness  . Bilateral ankle fractures  . Contusion of shoulder, right  . Ankle fracture  . Ankle sprain    PT - End of Session Activity Tolerance: Patient tolerated treatment well General Behavior During Session: [facility] for tasks performed Cognition: [facility] for tasks performed PT Plan of Care Consulted and Agree with Plan of Care: Patient   Lurena Nida, PTA/CLT 06/27/2012, 11:48 AM

## 2012-06-29 ENCOUNTER — Ambulatory Visit (HOSPITAL_COMMUNITY)
Admission: RE | Admit: 2012-06-29 | Discharge: 2012-06-29 | Disposition: A | Discharge status: Home / Self Care | Payer: Self-pay | Source: Ambulatory Visit | Attending: Orthopedic Surgery | Admitting: Orthopedic Surgery

## 2012-06-29 NOTE — Evaluation (Signed)
Physical Therapy Discharge  Patient Details  Name: [name] MRN: [id number] Date of Birth: 11/29/1945  Today's Date: 06/29/2012 Time: 1102-1140 PT Time Calculation (min): 38 min Charges: 1 MMT, 20' Self Care, 68' TE Visit#: 15  of 16   Re-eval: 06/30/12 Assessment Diagnosis: R ankle fracture, L ankle sprain Next MD Visit: Dr. Harrison-November 19th  Subjective Symptoms/Limitations Symptoms: Pt reports that she is doing her HEP.  She has been able to walk in the community with her [facility].  She reports that she is starting to do a little more everyday.   Pain Assessment Currently in Pain?: Yes Pain Score:   5 Pain Location: Ankle Pain Orientation: Left  Assessment RLE Strength Right Hip Extension:  (4+/5 (was 3/5)) Right Ankle Dorsiflexion: 5/5 (was 5/5) Right Ankle Plantar Flexion:  (4+/5, was 4/5) Right Ankle Inversion:  (4+/5was 4/5) Right Ankle Eversion:  (4+/5was 4/5) LLE Strength Left Ankle Dorsiflexion: 5/5 Left Ankle Plantar Flexion: 5/5 Left Ankle Inversion: 5/5 Left Ankle Eversion: 4/5 (was 4/5)  Mobility/Balance  Ambulation/Gait Ambulation/Gait: Yes Assistive device: Straight cane Gait Pattern: Antalgic Static Standing Balance Single Leg Stance - Right Leg: 3  Single Leg Stance - Left Leg: 13    Exercise/Treatments SLS 5 attempts: R: best 3 sec; L: best 13 sec Gait: x10 minutes independent with cueing for technique and form.     Physical Therapy Assessment and Plan PT Assessment and Plan Clinical Impression Statement: [name] has attended 15 OP PT visits s/p R ankle fracture and L ankle sprain with the following findings: She has met 3/4 STG and 2/3 LTG.  She is ambulating with a SPC in community and independently indoors.  She remains painful  to her B ankles secodnary to increased activity and independence with gait. At this point pt will be d/c from PT with HEP.  PT Duration:  (2 weeks) PT Plan: D/C    Goals Home Exercise  Program Pt will Perform Home Exercise Program: Independently PT Goal: Perform Home Exercise Program - Progress: Met PT Short Term Goals Time to Complete Short Term Goals: 3 weeks PT Short Term Goal 1: Pt will improve her LE strength in order to ambulate x5 minutes without RW in closed environment PT Short Term Goal 1 - Progress: Met PT Short Term Goal 2: Pt will improve her R and L balance and demo SLS x10 sec on static surface. PT Short Term Goal 2 - Progress: Progressing toward goal (13 sec L LE, 3 sec R LE) PT Short Term Goal 3: Pt will improve her ankle ROM by 5 degrees in each direction  PT Short Term Goal 3 - Progress: Met PT Long Term Goals Time to Complete Long Term Goals: Other (comment) (6 weeks) PT Long Term Goal 1: Pt will improve her LE strength and ambulate independently in indoor and outdoor enviroments x10 minutes.  PT Long Term Goal 1 - Progress: Met (w/antalgic gait ) PT Long Term Goal 2: Pt will improve her B ankle ROM to [facility] in order to ascend and descend 10 stairs w/1 handrail with reciprocal pattern in order to enter community locations.  PT Long Term Goal 2 - Progress: Progressing toward goal (step to pattern) Long Term Goal 3: Pt will improve her dynamic balance and ambulate with supervision on uneven surfaces to attend to her outdoor gardening and church related activities.  Long Term Goal 3 Progress: Met (She has attend church 2x. )  Problem List Patient Active Problem List  Diagnosis  .  HYPERCHOLESTEROLEMIA  . DEPRESSION  . HYPERTENSION  . PEPTIC ULCER DISEASE  . HIATAL HERNIA  . CONSTIPATION  . HEPATIC CYST  . HEMATEMESIS  . OSTEOARTHRITIS  . NAUSEA ALONE  . ABDOMINAL PAIN, CHRONIC  . Chest pain, unspecified  . Hypokalemia  . Bradycardia  . Giddiness  . Bilateral ankle fractures  . Contusion of shoulder, right  . Ankle fracture  . Ankle sprain    PT - End of Session Activity Tolerance: Patient tolerated treatment well General Behavior During  Session: [facility] for tasks performed Cognition: [facility] for tasks performed PT Plan of Care Consulted and Agree with Plan of Care: Patient  Annett Fabian, PT 06/29/2012, 11:57 AM  Physician Documentation Your signature is required to indicate approval of the treatment plan as stated above.  Please sign and either send electronically or make a copy of this report for your files and return this physician signed original.   Please mark one 1.__approve of plan  2. ___approve of plan with the following conditions.   ______________________________                                                          _____________________ Physician Signature                                                                                                             Date

## 2012-07-18 ENCOUNTER — Ambulatory Visit (INDEPENDENT_AMBULATORY_CARE_PROVIDER_SITE_OTHER): Payer: Self-pay | Admitting: Orthopedic Surgery

## 2012-07-18 ENCOUNTER — Encounter: Payer: Self-pay | Admitting: Orthopedic Surgery

## 2012-07-18 VITALS — Ht 65.0 in | Wt 220.0 lb

## 2012-07-18 DIAGNOSIS — M79673 Pain in unspecified foot: Secondary | ICD-10-CM

## 2012-07-18 DIAGNOSIS — M79609 Pain in unspecified limb: Secondary | ICD-10-CM

## 2012-07-18 MED ORDER — HYDROCODONE-ACETAMINOPHEN 10-325 MG PO TABS: 1.0000 | ORAL_TABLET | Freq: Four times a day (QID) | ORAL | Status: DC | PRN

## 2012-07-18 NOTE — Progress Notes (Signed)
Patient ID: [name], female   DOB: Feb 05, 1946, 66 y.o.   MRN: [id number] Chief Complaint  Patient presents with  . Follow-up    doi 01/2102 ankle fracture nad ankle sprain     The patient has 2 complaints today one dorsal RIGHT foot pain and 2nd Achilles tendon and ankle joint pain on the LEFT.  Generalized ankle lateral malleolar fracture and a LEFT ankle sprain and was treated with therapy. After bracing and casting.  She has severe pain when she stands up in the morning and has difficulty standing without her cane.  Review of systems is normal. She is diabetic  She is bilateral pes planus. She has tenderness on the dorsum of the RIGHT foot and also in the lateral gutter beneath the lateral malleolus.  She has tenderness and pain in the LEFT Achilles with swelling.  Differential diagnosis includes osteoarthritis of the foot, Achilles tendinitis, posterior tibial tendon dysfunction, grade 1.  Recommend bilateral soft over-the-counter orthotics, and a LEFT heel lift to address the Achilles tendinitis.  Activity as tolerated   I think an evaluation for her diabetic neuropathy is in order because her symptoms did not match or injuries. At this point.  Her fracture has healed and  12 weeks. Should be plenty of time to regain normal function in the RIGHT foot and  6 weeks on the LEFT side related to the ankle sprain

## 2012-07-18 NOTE — Patient Instructions (Addendum)
Home exercises   activities as tolerated  Pick up orthotic and heel lift at [facility]

## 2012-08-30 HISTORY — PX: OTHER SURGICAL HISTORY: SHX169

## 2012-09-14 ENCOUNTER — Other Ambulatory Visit: Payer: Self-pay | Admitting: Family Medicine

## 2012-09-14 DIAGNOSIS — Z139 Encounter for screening, unspecified: Secondary | ICD-10-CM

## 2012-10-17 ENCOUNTER — Ambulatory Visit (HOSPITAL_COMMUNITY)
Admission: RE | Admit: 2012-10-17 | Discharge: 2012-10-17 | Disposition: A | Discharge status: Home / Self Care | Payer: BC Managed Care – PPO | Source: Ambulatory Visit | Attending: Family Medicine | Admitting: Family Medicine

## 2012-10-17 DIAGNOSIS — Z1231 Encounter for screening mammogram for malignant neoplasm of breast: Secondary | ICD-10-CM | POA: Insufficient documentation

## 2012-10-17 DIAGNOSIS — Z139 Encounter for screening, unspecified: Secondary | ICD-10-CM

## 2012-10-20 ENCOUNTER — Other Ambulatory Visit: Payer: Self-pay | Admitting: Family Medicine

## 2012-10-20 DIAGNOSIS — R928 Other abnormal and inconclusive findings on diagnostic imaging of breast: Secondary | ICD-10-CM

## 2012-11-01 ENCOUNTER — Other Ambulatory Visit: Payer: Self-pay | Admitting: Family Medicine

## 2012-11-01 ENCOUNTER — Other Ambulatory Visit (HOSPITAL_COMMUNITY): Payer: Self-pay | Admitting: Family Medicine

## 2012-11-01 ENCOUNTER — Ambulatory Visit (HOSPITAL_COMMUNITY)
Admission: RE | Admit: 2012-11-01 | Discharge: 2012-11-01 | Disposition: A | Discharge status: Home / Self Care | Payer: BC Managed Care – PPO | Source: Ambulatory Visit | Attending: Family Medicine | Admitting: Family Medicine

## 2012-11-01 DIAGNOSIS — N63 Unspecified lump in unspecified breast: Secondary | ICD-10-CM | POA: Insufficient documentation

## 2012-11-01 DIAGNOSIS — R928 Other abnormal and inconclusive findings on diagnostic imaging of breast: Secondary | ICD-10-CM

## 2012-11-15 ENCOUNTER — Inpatient Hospital Stay (HOSPITAL_COMMUNITY)
Admission: EM | Admit: 2012-11-15 | Discharge: 2012-11-17 | DRG: 183 | Disposition: A | Discharge status: Home / Self Care | Payer: BC Managed Care – PPO | Attending: Family Medicine | Admitting: Family Medicine

## 2012-11-15 ENCOUNTER — Encounter (HOSPITAL_COMMUNITY): Payer: Self-pay

## 2012-11-15 ENCOUNTER — Emergency Department (HOSPITAL_COMMUNITY): Payer: BC Managed Care – PPO

## 2012-11-15 DIAGNOSIS — Z7901 Long term (current) use of anticoagulants: Secondary | ICD-10-CM

## 2012-11-15 DIAGNOSIS — I10 Essential (primary) hypertension: Secondary | ICD-10-CM | POA: Diagnosis present

## 2012-11-15 DIAGNOSIS — Z79899 Other long term (current) drug therapy: Secondary | ICD-10-CM

## 2012-11-15 DIAGNOSIS — Z90710 Acquired absence of both cervix and uterus: Secondary | ICD-10-CM

## 2012-11-15 DIAGNOSIS — F438 Other reactions to severe stress: Secondary | ICD-10-CM | POA: Diagnosis present

## 2012-11-15 DIAGNOSIS — Z86718 Personal history of other venous thrombosis and embolism: Secondary | ICD-10-CM

## 2012-11-15 DIAGNOSIS — K7689 Other specified diseases of liver: Secondary | ICD-10-CM | POA: Diagnosis present

## 2012-11-15 DIAGNOSIS — Z880 Allergy status to penicillin: Secondary | ICD-10-CM

## 2012-11-15 DIAGNOSIS — E1140 Type 2 diabetes mellitus with diabetic neuropathy, unspecified: Secondary | ICD-10-CM

## 2012-11-15 DIAGNOSIS — D649 Anemia, unspecified: Secondary | ICD-10-CM | POA: Diagnosis present

## 2012-11-15 DIAGNOSIS — Z9089 Acquired absence of other organs: Secondary | ICD-10-CM

## 2012-11-15 DIAGNOSIS — E119 Type 2 diabetes mellitus without complications: Secondary | ICD-10-CM | POA: Diagnosis present

## 2012-11-15 DIAGNOSIS — R42 Dizziness and giddiness: Secondary | ICD-10-CM | POA: Diagnosis present

## 2012-11-15 DIAGNOSIS — F4389 Other reactions to severe stress: Secondary | ICD-10-CM | POA: Diagnosis present

## 2012-11-15 DIAGNOSIS — K5732 Diverticulitis of large intestine without perforation or abscess without bleeding: Principal | ICD-10-CM | POA: Diagnosis present

## 2012-11-15 LAB — CBC WITH DIFFERENTIAL/PLATELET
Basophils Absolute: 0 10*3/uL (ref 0.0–0.1)
Eosinophils Absolute: 0 10*3/uL (ref 0.0–0.7)
Eosinophils Relative: 0 % (ref 0–5)
MCH: 24.6 pg — ABNORMAL LOW (ref 26.0–34.0)
MCHC: 33.1 g/dL (ref 30.0–36.0)
MCV: 74.3 fL — ABNORMAL LOW (ref 78.0–100.0)
Platelets: 287 10*3/uL (ref 150–400)
RDW: 16.6 % — ABNORMAL HIGH (ref 11.5–15.5)

## 2012-11-15 LAB — COMPREHENSIVE METABOLIC PANEL
ALT: 21 U/L (ref 0–35)
AST: 19 U/L (ref 0–37)
CO2: 30 mEq/L (ref 19–32)
Chloride: 96 mEq/L (ref 96–112)
Creatinine, Ser: 0.88 mg/dL (ref 0.50–1.10)
GFR calc non Af Amer: 68 mL/min — ABNORMAL LOW (ref >90)
Sodium: 135 mEq/L (ref 135–145)
Total Bilirubin: 0.3 mg/dL (ref 0.3–1.2)

## 2012-11-15 LAB — GLUCOSE, CAPILLARY: Glucose-Capillary: 154 mg/dL — ABNORMAL HIGH (ref 70–99)

## 2012-11-15 LAB — PROTIME-INR: INR: 2.42 — ABNORMAL HIGH (ref 0.00–1.49)

## 2012-11-15 LAB — URINALYSIS, ROUTINE W REFLEX MICROSCOPIC
Glucose, UA: NEGATIVE mg/dL
Ketones, ur: NEGATIVE mg/dL
Leukocytes, UA: NEGATIVE
pH: 5.5 (ref 5.0–8.0)

## 2012-11-15 LAB — LIPASE, BLOOD: Lipase: 21 U/L (ref 11–59)

## 2012-11-15 LAB — URINE MICROSCOPIC-ADD ON

## 2012-11-15 MED ORDER — PANTOPRAZOLE SODIUM 40 MG PO TBEC: 40.0000 mg | DELAYED_RELEASE_TABLET | Freq: Every day | ORAL | Status: DC

## 2012-11-15 MED ORDER — SODIUM CHLORIDE 0.9 % IJ SOLN
3.0000 mL | Freq: Two times a day (BID) | INTRAMUSCULAR | Status: DC
  Administered 2012-11-17 (×2): 3 mL via INTRAVENOUS

## 2012-11-15 MED ORDER — POTASSIUM CHLORIDE CRYS ER 10 MEQ PO TBCR
10.0000 meq | EXTENDED_RELEASE_TABLET | Freq: Every morning | ORAL | Status: DC
  Administered 2012-11-16 – 2012-11-17 (×2): 10 meq via ORAL
  Filled 2012-11-15 (×4): qty 1

## 2012-11-15 MED ORDER — ACETAMINOPHEN 650 MG RE SUPP: 650.0000 mg | Freq: Four times a day (QID) | RECTAL | Status: DC | PRN

## 2012-11-15 MED ORDER — IOHEXOL 300 MG/ML  SOLN
50.0000 mL | Freq: Once | INTRAMUSCULAR | Status: AC | PRN
  Administered 2012-11-15: 50 mL via ORAL

## 2012-11-15 MED ORDER — ACETAMINOPHEN 325 MG PO TABS
650.0000 mg | ORAL_TABLET | Freq: Four times a day (QID) | ORAL | Status: DC | PRN
  Administered 2012-11-16 – 2012-11-17 (×2): 650 mg via ORAL
  Filled 2012-11-15 (×2): qty 2

## 2012-11-15 MED ORDER — WARFARIN - PHARMACIST DOSING INPATIENT: Freq: Every day | Status: DC

## 2012-11-15 MED ORDER — HYDROCODONE-ACETAMINOPHEN 5-325 MG PO TABS
1.0000 | ORAL_TABLET | ORAL | Status: DC | PRN
  Administered 2012-11-15 (×2): 1 via ORAL
  Administered 2012-11-16: 2 via ORAL
  Filled 2012-11-15: qty 1
  Filled 2012-11-15: qty 2
  Filled 2012-11-15: qty 1

## 2012-11-15 MED ORDER — HYOSCYAMINE SULFATE 0.125 MG SL SUBL: 0.1250 mg | SUBLINGUAL_TABLET | Freq: Four times a day (QID) | SUBLINGUAL | Status: DC | PRN

## 2012-11-15 MED ORDER — INSULIN ASPART 100 UNIT/ML ~~LOC~~ SOLN: 0.0000 [IU] | Freq: Three times a day (TID) | SUBCUTANEOUS | Status: DC

## 2012-11-15 MED ORDER — PANTOPRAZOLE SODIUM 40 MG PO TBEC
40.0000 mg | DELAYED_RELEASE_TABLET | Freq: Every day | ORAL | Status: DC
  Administered 2012-11-16 – 2012-11-17 (×2): 40 mg via ORAL
  Filled 2012-11-15 (×2): qty 1

## 2012-11-15 MED ORDER — ALUM & MAG HYDROXIDE-SIMETH 200-200-20 MG/5ML PO SUSP
30.0000 mL | Freq: Four times a day (QID) | ORAL | Status: DC | PRN
  Administered 2012-11-16 – 2012-11-17 (×2): 30 mL via ORAL
  Filled 2012-11-15 (×2): qty 30

## 2012-11-15 MED ORDER — IOHEXOL 300 MG/ML  SOLN
100.0000 mL | Freq: Once | INTRAMUSCULAR | Status: AC | PRN
  Administered 2012-11-15: 100 mL via INTRAVENOUS

## 2012-11-15 MED ORDER — WARFARIN SODIUM 2 MG PO TABS
3.0000 mg | ORAL_TABLET | ORAL | Status: AC
  Administered 2012-11-15: 3 mg via ORAL
  Filled 2012-11-15: qty 1

## 2012-11-15 MED ORDER — METRONIDAZOLE IN NACL 5-0.79 MG/ML-% IV SOLN
INTRAVENOUS | Status: AC
  Filled 2012-11-15: qty 300

## 2012-11-15 MED ORDER — METRONIDAZOLE IN NACL 5-0.79 MG/ML-% IV SOLN
500.0000 mg | Freq: Four times a day (QID) | INTRAVENOUS | Status: DC
  Administered 2012-11-15 – 2012-11-17 (×7): 500 mg via INTRAVENOUS
  Filled 2012-11-15 (×20): qty 100

## 2012-11-15 MED ORDER — SODIUM CHLORIDE 0.9 % IV SOLN: 250.0000 mL | INTRAVENOUS | Status: DC | PRN

## 2012-11-15 MED ORDER — MORPHINE SULFATE 2 MG/ML IJ SOLN: 2.0000 mg | INTRAMUSCULAR | Status: DC | PRN

## 2012-11-15 MED ORDER — SODIUM CHLORIDE 0.9 % IV SOLN
INTRAVENOUS | Status: DC
  Administered 2012-11-15: 15:00:00 via INTRAVENOUS

## 2012-11-15 MED ORDER — ONDANSETRON HCL 4 MG/2ML IJ SOLN
4.0000 mg | Freq: Once | INTRAMUSCULAR | Status: AC
  Administered 2012-11-15: 4 mg via INTRAVENOUS
  Filled 2012-11-15: qty 2

## 2012-11-15 MED ORDER — MORPHINE SULFATE 4 MG/ML IJ SOLN
4.0000 mg | Freq: Once | INTRAMUSCULAR | Status: AC
  Administered 2012-11-15: 4 mg via INTRAVENOUS
  Filled 2012-11-15: qty 1

## 2012-11-15 MED ORDER — HYDROCHLOROTHIAZIDE 25 MG PO TABS
25.0000 mg | ORAL_TABLET | Freq: Every day | ORAL | Status: DC
  Administered 2012-11-16 – 2012-11-17 (×2): 25 mg via ORAL
  Filled 2012-11-15 (×2): qty 1

## 2012-11-15 MED ORDER — CIPROFLOXACIN IN D5W 400 MG/200ML IV SOLN
400.0000 mg | Freq: Two times a day (BID) | INTRAVENOUS | Status: DC
  Administered 2012-11-15 – 2012-11-17 (×4): 400 mg via INTRAVENOUS
  Filled 2012-11-15 (×10): qty 200

## 2012-11-15 MED ORDER — ONDANSETRON HCL 4 MG/2ML IJ SOLN: 4.0000 mg | Freq: Four times a day (QID) | INTRAMUSCULAR | Status: DC | PRN

## 2012-11-15 MED ORDER — ONDANSETRON HCL 4 MG PO TABS
4.0000 mg | ORAL_TABLET | Freq: Four times a day (QID) | ORAL | Status: DC | PRN
  Administered 2012-11-16: 4 mg via ORAL
  Filled 2012-11-15: qty 1

## 2012-11-15 MED ORDER — CIPROFLOXACIN IN D5W 400 MG/200ML IV SOLN
INTRAVENOUS | Status: AC
  Filled 2012-11-15: qty 200

## 2012-11-15 MED ORDER — SODIUM CHLORIDE 0.9 % IJ SOLN: 3.0000 mL | INTRAMUSCULAR | Status: DC | PRN

## 2012-11-15 NOTE — H&P (Signed)
History and Physical  [name] ZOX:[id number] DOB: November 26, 1945 DOA: 11/15/2012  Referring physician: Carleene Cooper, MD PCP: Lilyan Punt, MD   Chief Complaint: Abdominal pain  HPI:  67 year old woman presented with left lower or upper abdominal pain. CT of the abdomen and pelvis revealed acute diverticulitis the patient was referred for IV antibiotics and pain control.  Patient had been doing well until 3/18 when she developed left lower quadrant pain described as cramping. Maximum intensity 10/10. No aggravating or alleviating factors. Some nausea but no vomiting. When her pain did not improve overnight and continued into today 3/19 she called her physician who advised her to come to the emergency department. Pain is now better controlled with morphine in the emergency department. She reports a history of diverticulitis in the past which is confirmed by chart review.  In ED:  Afebrile, VSS  CT showed Proximal to mid descending colon diverticulitis. No drainable abscess.  Screening laboratory studies unremarkable.  Review of Systems:  Negative for fever, visual changes, sore throat, rash, new muscle aches, chest pain, SOB, dysuria, bleeding,  Positive for nausea.  Past Medical History  Diagnosis Date  . Hypertension   . Diverticulitis   . Cyst on liver  . Blood clot of artery under arm   . Diabetes mellitus     controlled by diet    Past Surgical History  Procedure Laterality Date  . Back surgery    . Cholecystectomy    . Knee arthrocentesis  left  . Breast surgery  right    cyst removed  . Abdominal hysterectomy    . Tubal ligation    . Cast application  02/14/2012    Procedure: CAST APPLICATION;  Surgeon: Vickki Hearing, MD;  Location: [facility];  Service: Orthopedics;  Laterality: Right;  procedure room     Social History:  reports that she has never smoked. She has never used smokeless tobacco. She reports that she does not drink alcohol or use illicit  drugs.  Allergies  Allergen Reactions  . Penicillins Hives    Family History  Problem Relation Age of Onset  . Stroke Mother   . Cancer Sister   . Diabetes Sister   . Seizures Brother   . Diabetes Brother      Prior to Admission medications   Medication Sig Start Date End Date Taking? Authorizing Provider  Cholecalciferol (VITAMIN D) 2000 UNITS CAPS Take 1 capsule by mouth every morning.   Yes Historical Provider, MD  dicyclomine (BENTYL) 20 MG tablet Take 20 mg by mouth 3 (three) times daily as needed. For stomach pain   Yes Historical Provider, MD  hydrochlorothiazide (HYDRODIURIL) 25 MG tablet Take 25 mg by mouth daily.   Yes Historical Provider, MD  hyoscyamine (LEVSIN SL) 0.125 MG SL tablet Place 0.125 mg under the tongue every 6 (six) hours as needed for cramping or diarrhea or loose stools. 11/15/12  Yes Historical Provider, MD  nitroGLYCERIN (NITROSTAT) 0.4 MG SL tablet Place 0.4 mg under the tongue every 5 (five) minutes x 3 doses as needed. 05/18/11 11/15/12 Yes Jodelle Gross, NP  omeprazole (PRILOSEC) 20 MG capsule Take 20 mg by mouth every morning.    Yes Historical Provider, MD  potassium chloride (K-DUR) 10 MEQ tablet Take 10 mEq by mouth every morning.   Yes Historical Provider, MD  Probiotic Product (PHILLIPS COLON HEALTH) CAPS Take 1 capsule by mouth daily.    Yes Historical Provider, MD  warfarin (COUMADIN) 3 MG tablet Take 3-4.5  mg by mouth as directed. Takes one & one-half tablets (4.5mg  TOTAL) on Mondays, Wednesdays, Fridays, Saturdays, and Sundays. Take one tablet (3mg TOTAL) on Tuesdays and Thursdays. *taken at 5pm*   Yes Historical Provider, MD   Physical Exam: Filed Vitals:   11/15/12 1408 11/15/12 1503  BP: 144/44 124/83  Pulse: 70 84  Temp: 98.1 F (36.7 C)   TempSrc: Oral   Resp: 18 18  Height: 5\' 5"  (1.651 m)   Weight: 99.791 kg (220 lb)   SpO2: 100% 100%    General:  Examined in the emergency department. Appears calm and comfortable Eyes:  PERRL, normal lids, irises  ENT: grossly normal hearing, lips  Neck: no LAD, masses or thyromegaly Cardiovascular: RRR, no m/r/g. No LE edema. Telemetry: SR, no arrhythmias  Respiratory: CTA bilaterally, no w/r/r. Normal respiratory effort. Abdomen: soft, left lower quadrant pain to palpation. No rebound or guarding. Skin: no rash or induration seen  Musculoskeletal: grossly normal tone BUE/BLE Psychiatric: grossly normal mood and affect, speech fluent and appropriate Neurologic: grossly non-focal.  Wt Readings from Last 3 Encounters:  11/15/12 99.791 kg (220 lb)  07/18/12 99.791 kg (220 lb)  05/17/12 99.791 kg (220 lb)    Labs on Admission:  Basic Metabolic Panel:  Recent Labs Lab 11/15/12 1448  NA 135  K 3.6  CL 96  CO2 30  GLUCOSE 121*  BUN 14  CREATININE 0.88  CALCIUM 9.5    Liver Function Tests:  Recent Labs Lab 11/15/12 1448  AST 19  ALT 21  ALKPHOS 82  BILITOT 0.3  PROT 7.7  ALBUMIN 3.5    Recent Labs Lab 11/15/12 1448  LIPASE 21    CBC:  Recent Labs Lab 11/15/12 1448  WBC 10.0  NEUTROABS 5.6  HGB 13.1  HCT 39.6  MCV 74.3*  PLT 287    Radiological Exams on Admission: Ct Abdomen Pelvis W Contrast  11/15/2012  *RADIOLOGY REPORT*  Clinical Data: Left flank pain and left lower quadrant pain. History diverticulitis.  Diabetic hypertensive patient post cholecystectomy and hysterectomy.  CT ABDOMEN AND PELVIS WITH CONTRAST  Technique:  Multidetector CT imaging of the abdomen and pelvis was performed following the standard protocol during bolus administration of intravenous contrast.  Contrast: 50mL OMNIPAQUE IOHEXOL 300 MG/ML  SOLN, OMNIPAQUE IOHEXOL 300 MG/ML  SOLN  Comparison: 03/13/2010.  Findings: Proximal to mid descending colon diverticulitis.  No drainable abscess.  Scattered diverticula throughout remainder of the descending colon and portions of the sigmoid colon.  No inflammation surrounds the appendix.  No free intraperitoneal air.   Enlarging  right lobe liver cyst now measuring 11.4 x 9.3 x 8.2 cm versus prior 9.2 x 8.4 x 7.9 cm.  Enlargement left lobe liver cyst measuring up to 1.4 cm.  Fatty infiltration of the liver.  No worrisome splenic, renal, adrenal or pancreatic lesion.  Post cholecystectomy.  Prior hysterectomy.  Mild atherosclerotic type changes of the aorta without aneurysmal dilation.  Lung bases clear.  Degenerative changes lumbar spine L3-4 through L5-S1.  Bilateral hip joint degenerative changes.  Small sclerotic focus left ilium unchanged.  IMPRESSION: Proximal to mid descending colon diverticulitis.  No drainable abscess.  Enlarging  right lobe liver cyst now measuring 11.4 x 9.3 x 8.2 cm versus prior 9.2 x 8.4 x 7.9 cm.  Enlargement left lobe liver cyst measuring up to 1.4 cm.  Fatty infiltration of the liver.  Please see above appear   Original Report Authenticated By: Lacy Duverney, M.D.  Principal Problem:   Acute diverticulitis Active Problems:   HYPERTENSION   Diabetes mellitus type 2, diet-controlled   Assessment/Plan 1. Acute diverticulitis: No evidence of complicating feature by CT. IV antibiotics, pain control.  2. Diet controlled diabetes: Sliding scale insulin.  3. Hypertension: Stable. Continue hydrochlorothiazide.  4. History of arterial clot and arm: Continue warfarin per pharmacy.  5. Enlarging right/left lobe liver cyst now measuring: Followup as outpatient as clinically indicated.   Code Status: Full code Family Communication: None present  Disposition Plan/Anticipated LOS: admit inpatient, 2 days   Time spent: 45  minutes  Brendia Sacks, MD  [facility] Pager [phone] 11/15/2012, 5:11 PM

## 2012-11-15 NOTE — Progress Notes (Signed)
ANTICOAGULATION CONSULT NOTE - Initial Consult  Pharmacy Consult for Warfarin Indication: History of arterial clot in arm.  Allergies  Allergen Reactions  . Penicillins Hives    Patient Measurements: Height: 5\' 5"  (165.1 cm) Weight: 242 lb 1 oz (109.8 kg) IBW/kg (Calculated) : 57  Vital Signs: Temp: 98 F (36.7 C) (03/19 1857) Temp src: Oral (03/19 1857) BP: 92/73 mmHg (03/19 1857) Pulse Rate: 70 (03/19 1857)  Labs:  Recent Labs  11/15/12 1448  HGB 13.1  HCT 39.6  PLT 287  LABPROT 25.2*  INR 2.42*  CREATININE 0.88    Estimated Creatinine Clearance: 79.6 ml/min (by C-G formula based on Cr of 0.88).   Medical History: Past Medical History  Diagnosis Date  . Hypertension   . Diverticulitis   . Cyst on liver  . Blood clot of artery under arm   . Diabetes mellitus     controlled by diet    Medications:  Prescriptions prior to admission  Medication Sig Dispense Refill  . Cholecalciferol (VITAMIN D) 2000 UNITS CAPS Take 1 capsule by mouth every morning.      . dicyclomine (BENTYL) 20 MG tablet Take 20 mg by mouth 3 (three) times daily as needed. For stomach pain      . hydrochlorothiazide (HYDRODIURIL) 25 MG tablet Take 25 mg by mouth daily.      . hyoscyamine (LEVSIN SL) 0.125 MG SL tablet Place 0.125 mg under the tongue every 6 (six) hours as needed for cramping or diarrhea or loose stools.      . nitroGLYCERIN (NITROSTAT) 0.4 MG SL tablet Place 0.4 mg under the tongue every 5 (five) minutes x 3 doses as needed.      [name] omeprazole (PRILOSEC) 20 MG capsule Take 20 mg by mouth every morning.       . potassium chloride (K-DUR) 10 MEQ tablet Take 10 mEq by mouth every morning.      . Probiotic Product (PHILLIPS COLON HEALTH) CAPS Take 1 capsule by mouth daily.       [name] warfarin (COUMADIN) 3 MG tablet Take 3-4.5 mg by mouth as directed. Takes one & one-half tablets (4.5mg  TOTAL) on Mondays, Wednesdays, Fridays, Saturdays, and Sundays. Take one tablet (3mg TOTAL) on  Tuesdays and Thursdays. *taken at 5pm*        Assessment: Okay for Protocol INR at goal. Acute diverticulitis being treated w/ Cipro and Metronidazole which can increase INR response to warfarin therapy.    Goal of Therapy:  INR 2-3   Plan:  Warfarin 3mg  PO x 1 this evening. Daily PT/INR.  [name] 11/15/2012,7:19 PM

## 2012-11-15 NOTE — ED Notes (Signed)
Pt reports pain to her left side that radiates down to her lower back.  Pt denies any injury to the area.

## 2012-11-15 NOTE — ED Provider Notes (Signed)
History     This chart was scribed for Osvaldo Human, MD, MD by Smitty Pluck, ED Scribe. The patient was seen in room [facility] and the patient's care was started at 2:30 PM.    CSN: [id number]  Arrival date & time 11/15/12  1403      No chief complaint on file.    The history is provided by the patient. No language interpreter was used.   [name] is a 67 y.o. female with hx of diverticulitis, HTN and DM controlled by diet who presents to the Emergency Department complaining of constant, moderate abdominal and left flank pain radiating to left back onset 1 day ago. Pt was seen by Dr. Lilyan Punt and referred here. Breathing deeply aggravates the pain. Pt reports having diarrhea last night. Pt denies injury to back, fall, dysuria, rash, syncope, fever, chills, nausea, vomiting, weakness, cough, SOB and any other pain. Pt reports hx of hysterectomy.   Pt is allergic to penicillin.  She denies drinking alcohol and smoking cigarettes.   Past Medical History  Diagnosis Date  . Hypertension   . Diverticulitis   . Cyst on liver  . Blood clot of artery under arm   . Diabetes mellitus     controlled by diet    Past Surgical History  Procedure Laterality Date  . Back surgery    . Cholecystectomy    . Knee arthrocentesis  left  . Breast surgery  right    cyst removed  . Abdominal hysterectomy    . Tubal ligation    . Cast application  02/14/2012    Procedure: CAST APPLICATION;  Surgeon: Vickki Hearing, MD;  Location: [facility];  Service: Orthopedics;  Laterality: Right;  procedure room     Family History  Problem Relation Age of Onset  . Stroke Mother   . Cancer Sister   . Diabetes Sister   . Seizures Brother   . Diabetes Brother     History  Substance Use Topics  . Smoking status: Never Smoker   . Smokeless tobacco: Never Used  . Alcohol Use: No    OB History   Grav Para Term Preterm Abortions TAB SAB Ect Mult Living   3 3 3       3        Review of Systems 10 Systems reviewed and all are negative for acute change except as noted in the HPI.   Allergies  Penicillins  Home Medications   Current Outpatient Rx  Name  Route  Sig  Dispense  Refill  . Cholecalciferol (VITAMIN D) 2000 UNITS CAPS   Oral   Take 1 capsule by mouth every morning.         . dicyclomine (BENTYL) 20 MG tablet   Oral   Take 20 mg by mouth 3 (three) times daily as needed. For stomach pain         . hydrochlorothiazide (HYDRODIURIL) 25 MG tablet   Oral   Take 25 mg by mouth daily.         [name] HYDROcodone-acetaminophen (NORCO) 10-325 MG per tablet   Oral   Take 1 tablet by mouth every 6 (six) hours as needed. For pain   60 tablet   2   . EXPIRED: nitroGLYCERIN (NITROSTAT) 0.4 MG SL tablet   Sublingual   Place 0.4 mg under the tongue every 5 (five) minutes x 3 doses as needed.         [name] omeprazole (PRILOSEC) 20  MG capsule   Oral   Take 20 mg by mouth every morning.          . potassium chloride (K-DUR) 10 MEQ tablet   Oral   Take 10 mEq by mouth every morning.         . Probiotic Product (PHILLIPS COLON HEALTH) CAPS   Oral   Take 1 capsule by mouth every morning.         . prochlorperazine (COMPAZINE) 25 MG suppository      Unwrap and insert 1 PR TID  PRN nausea, vomiting or headache   6 suppository   0   . warfarin (COUMADIN) 3 MG tablet   Oral   Take 3 mg by mouth every evening. *taken at 5pm           BP 144/44  Pulse 70  Temp(Src) 98.1 F (36.7 C) (Oral)  Resp 18  Ht 5\' 5"  (1.651 m)  Wt 220 lb (99.791 kg)  BMI 36.61 kg/m2  SpO2 100%  Physical Exam  Nursing note and vitals reviewed. Constitutional: She is oriented to person, place, and time. She appears well-developed and well-nourished. No distress.  HENT:  Head: Normocephalic and atraumatic.  Eyes: EOM are normal. Pupils are equal, round, and reactive to light.  Neck: Normal range of motion. Neck supple. No tracheal deviation present.   Cardiovascular: Normal rate, regular rhythm and normal heart sounds.   Pulmonary/Chest: Effort normal and breath sounds normal. No respiratory distress.  Abdominal: Soft. She exhibits no distension and no mass. There is tenderness in the left lower quadrant. There is no rebound and no guarding.  Left flank tenderness   Musculoskeletal: Normal range of motion.  Neurological: She is alert and oriented to person, place, and time.  Skin: Skin is warm and dry.  Psychiatric: She has a normal mood and affect. Her behavior is normal.    ED Course  Procedures (including critical care time) DIAGNOSTIC STUDIES: Oxygen Saturation is 100% on room air, normal by my interpretation.    COORDINATION OF CARE: 2:35 PM Discussed ED treatment with pt and pt agrees.  2:50 PM Ordered:  Medications  0.9 %  sodium chloride infusion (not administered)  morphine 4 MG/ML injection 4 mg (not administered)  ondansetron (ZOFRAN) injection 4 mg (not administered)    4:59 PM Results for orders placed during the hospital encounter of 11/15/12  URINALYSIS, ROUTINE W REFLEX MICROSCOPIC      Result Value Range   Color, Urine YELLOW  YELLOW   APPearance CLEAR  CLEAR   Specific Gravity, Urine >1.030 (*) 1.005 - 1.030   pH 5.5  5.0 - 8.0   Glucose, UA NEGATIVE  NEGATIVE mg/dL   Hgb urine dipstick SMALL (*) NEGATIVE   Bilirubin Urine NEGATIVE  NEGATIVE   Ketones, ur NEGATIVE  NEGATIVE mg/dL   Protein, ur NEGATIVE  NEGATIVE mg/dL   Urobilinogen, UA 2.0 (*) 0.0 - 1.0 mg/dL   Nitrite NEGATIVE  NEGATIVE   Leukocytes, UA NEGATIVE  NEGATIVE  COMPREHENSIVE METABOLIC PANEL      Result Value Range   Sodium 135  135 - 145 mEq/L   Potassium 3.6  3.5 - 5.1 mEq/L   Chloride 96  96 - 112 mEq/L   CO2 30  19 - 32 mEq/L   Glucose, Bld 121 (*) 70 - 99 mg/dL   BUN 14  6 - 23 mg/dL   Creatinine, Ser 1.61  0.50 - 1.10 mg/dL   Calcium 9.5  8.4 -  10.5 mg/dL   Total Protein 7.7  6.0 - 8.3 g/dL   Albumin 3.5  3.5 - 5.2 g/dL    AST 19  0 - 37 U/L   ALT 21  0 - 35 U/L   Alkaline Phosphatase 82  39 - 117 U/L   Total Bilirubin 0.3  0.3 - 1.2 mg/dL   GFR calc non Af Amer 68 (*) >90 mL/min   GFR calc Af Amer 79 (*) >90 mL/min  LIPASE, BLOOD      Result Value Range   Lipase 21  11 - 59 U/L  CBC WITH DIFFERENTIAL      Result Value Range   WBC 10.0  4.0 - 10.5 K/uL   RBC 5.33 (*) 3.87 - 5.11 MIL/uL   Hemoglobin 13.1  12.0 - 15.0 g/dL   HCT 40.9  81.1 - 91.4 %   MCV 74.3 (*) 78.0 - 100.0 fL   MCH 24.6 (*) 26.0 - 34.0 pg   MCHC 33.1  30.0 - 36.0 g/dL   RDW 78.2 (*) 95.6 - 21.3 %   Platelets 287  150 - 400 K/uL   Neutrophils Relative 56  43 - 77 %   Neutro Abs 5.6  1.7 - 7.7 K/uL   Lymphocytes Relative 33  12 - 46 %   Lymphs Abs 3.3  0.7 - 4.0 K/uL   Monocytes Relative 10  3 - 12 %   Monocytes Absolute 1.0  0.1 - 1.0 K/uL   Eosinophils Relative 0  0 - 5 %   Eosinophils Absolute 0.0  0.0 - 0.7 K/uL   Basophils Relative 0  0 - 1 %   Basophils Absolute 0.0  0.0 - 0.1 K/uL  URINE MICROSCOPIC-ADD ON      Result Value Range   Squamous Epithelial / LPF FEW (*) RARE   WBC, UA 0-2  <3 WBC/hpf   RBC / HPF 3-6  <3 RBC/hpf   Bacteria, UA FEW (*) RARE   Ct Abdomen Pelvis W Contrast  11/15/2012  *RADIOLOGY REPORT*  Clinical Data: Left flank pain and left lower quadrant pain. History diverticulitis.  Diabetic hypertensive patient post cholecystectomy and hysterectomy.  CT ABDOMEN AND PELVIS WITH CONTRAST  Technique:  Multidetector CT imaging of the abdomen and pelvis was performed following the standard protocol during bolus administration of intravenous contrast.  Contrast: 50mL OMNIPAQUE IOHEXOL 300 MG/ML  SOLN, OMNIPAQUE IOHEXOL 300 MG/ML  SOLN  Comparison: 03/13/2010.  Findings: Proximal to mid descending colon diverticulitis.  No drainable abscess.  Scattered diverticula throughout remainder of the descending colon and portions of the sigmoid colon.  No inflammation surrounds the appendix.  No free intraperitoneal  air.  Enlarging  right lobe liver cyst now measuring 11.4 x 9.3 x 8.2 cm versus prior 9.2 x 8.4 x 7.9 cm.  Enlargement left lobe liver cyst measuring up to 1.4 cm.  Fatty infiltration of the liver.  No worrisome splenic, renal, adrenal or pancreatic lesion.  Post cholecystectomy.  Prior hysterectomy.  Mild atherosclerotic type changes of the aorta without aneurysmal dilation.  Lung bases clear.  Degenerative changes lumbar spine L3-4 through L5-S1.  Bilateral hip joint degenerative changes.  Small sclerotic focus left ilium unchanged.  IMPRESSION: Proximal to mid descending colon diverticulitis.  No drainable abscess.  Enlarging  right lobe liver cyst now measuring 11.4 x 9.3 x 8.2 cm versus prior 9.2 x 8.4 x 7.9 cm.  Enlargement left lobe liver cyst measuring up to 1.4 cm.  Fatty infiltration  of the liver.  Please see above appear   Original Report Authenticated By: Lacy Duverney, M.D.    CT abdomen/pelvis shows acute diverticulitis.  Will call [facility] to admit her for treatment of diverticulitis.          1. Acute diverticulitis    I personally performed the services described in this documentation, which was scribed in my presence. The recorded information has been reviewed and is accurate.  Osvaldo Human, MD        Carleene Cooper III, MD 11/15/12 [phone]

## 2012-11-16 DIAGNOSIS — I10 Essential (primary) hypertension: Secondary | ICD-10-CM

## 2012-11-16 DIAGNOSIS — E119 Type 2 diabetes mellitus without complications: Secondary | ICD-10-CM

## 2012-11-16 DIAGNOSIS — K5732 Diverticulitis of large intestine without perforation or abscess without bleeding: Principal | ICD-10-CM

## 2012-11-16 LAB — CBC
HCT: 34.9 % — ABNORMAL LOW (ref 36.0–46.0)
MCH: 24.6 pg — ABNORMAL LOW (ref 26.0–34.0)
MCV: 74.7 fL — ABNORMAL LOW (ref 78.0–100.0)
Platelets: 241 10*3/uL (ref 150–400)
RBC: 4.67 MIL/uL (ref 3.87–5.11)
RDW: 16.8 % — ABNORMAL HIGH (ref 11.5–15.5)
WBC: 6.9 10*3/uL (ref 4.0–10.5)

## 2012-11-16 LAB — BASIC METABOLIC PANEL
CO2: 30 mEq/L (ref 19–32)
Calcium: 8.6 mg/dL (ref 8.4–10.5)
Chloride: 100 mEq/L (ref 96–112)
Creatinine, Ser: 0.84 mg/dL (ref 0.50–1.10)
Glucose, Bld: 103 mg/dL — ABNORMAL HIGH (ref 70–99)

## 2012-11-16 LAB — GLUCOSE, CAPILLARY
Glucose-Capillary: 111 mg/dL — ABNORMAL HIGH (ref 70–99)
Glucose-Capillary: 132 mg/dL — ABNORMAL HIGH (ref 70–99)

## 2012-11-16 MED ORDER — WARFARIN SODIUM 2 MG PO TABS
3.0000 mg | ORAL_TABLET | Freq: Once | ORAL | Status: AC
  Administered 2012-11-16: 3 mg via ORAL
  Filled 2012-11-16: qty 1

## 2012-11-16 MED ORDER — POTASSIUM CHLORIDE CRYS ER 20 MEQ PO TBCR
40.0000 meq | EXTENDED_RELEASE_TABLET | Freq: Once | ORAL | Status: AC
  Administered 2012-11-16: 40 meq via ORAL
  Filled 2012-11-16: qty 1

## 2012-11-16 NOTE — Care Management Note (Signed)
    Page 1 of 1   11/17/2012     11:22:46 AM   CARE MANAGEMENT NOTE 11/17/2012  Patient:  [name], [name]   Account Number:  [id number]  Date Initiated:  11/16/2012  Documentation initiated by:  Rosemary Holms  Subjective/Objective Assessment:   Pt admitted with abdominal pain. Lives at home with spouse. No HH needs anticipated or identified.     Action/Plan:   Anticipated DC Date:  11/17/2012   Anticipated DC Plan:  HOME/SELF CARE      DC Planning Services  CM consult      Choice offered to / List presented to:             Status of service:  Completed, signed off Medicare Important Message given?   (If response is "NO", the following Medicare IM given date fields will be blank) Date Medicare IM given:   Date Additional Medicare IM given:    Discharge Disposition:  HOME/SELF CARE  Per UR Regulation:    If discussed at Long Length of Stay Meetings, dates discussed:    Comments:  11/16/12 Rosemary Holms RN BSN CM

## 2012-11-16 NOTE — Progress Notes (Signed)
[facility] PROGRESS NOTE  [name] ZOX:[id number] DOB: 07/19/1946 DOA: 11/15/2012 PCP: Lilyan Punt, MD  Assessment: 1. Acute diverticulitis: Improving symptomatically. Remains afebrile, no leukocytosis. No evidence of complicating feature by CT. IV antibiotics, pain control.  2. Diet controlled diabetes: Stable. Sliding scale insulin.  3. Hypertension: Stable. Continue hydrochlorothiazide.  4. History of arterial clot and arm: Continue warfarin per pharmacy. INR therapeutic. 5. Normocytic anemia: Stable. Followup as outpatient as needed. 6. Enlarging right/left lobe liver cyst now measuring: Followup as outpatient as clinically indicated.   Plan: 1. Continue IV antibiotics. Pain control. Can likely change to oral antibiotics 3/21. 2. Replete potassium. 3. Likely home on 3/21.  Code Status: Full code Family Communication: Discussed with husband at bedside Disposition Plan: As above  Brendia Sacks, MD  [facility]  Pager [phone] If 7PM-7AM, please contact night-coverage at [email or web address], password [facility] 11/16/2012, 9:47 AM  LOS: 1 day   Brief narrative: 67 year old woman presented with left lower or upper abdominal pain. CT of the abdomen and pelvis revealed acute diverticulitis the patient was referred for IV antibiotics and pain control.  Consultants:  None  Procedures:    Antibiotics:  Ciprofloxacin 3/20 >>  Flagyl 3/20 >>  HPI/Subjective: Afebrile, vital signs stable. Overall feels better still has some mid left abdominal pain. No nausea or vomiting. Tolerating diet. 2 stools today.  Objective: Filed Vitals:   11/15/12 1857 11/15/12 2136 11/15/12 2214 11/16/12 0558  BP: 92/73 101/33 142/56 120/62  Pulse: 70 78  63  Temp: 98 F (36.7 C) 98.2 F (36.8 C)  97.6 F (36.4 C)  TempSrc: Oral Oral    Resp: 20 20  20   Height:      Weight: 109.8 kg (242 lb 1 oz)     SpO2: 100% 99%  100%    Intake/Output Summary (Last 24 hours) at  11/16/12 0947 Last data filed at 11/16/12 0420  Gross per 24 hour  Intake    640 ml  Output    100 ml  Net    540 ml   Filed Weights   11/15/12 1408 11/15/12 1857  Weight: 99.791 kg (220 lb) 109.8 kg (242 lb 1 oz)    Exam:  General:  Appears calm and comfortable Cardiovascular: RRR, no m/r/g. No LE edema. Respiratory: CTA bilaterally, no w/r/r. Normal respiratory effort. Abdomen: soft, ntnd Psychiatric: grossly normal mood and affect, speech fluent and appropriate  Data Reviewed: Basic Metabolic Panel:  Recent Labs Lab 11/15/12 1448 11/16/12 0541  NA 135 138  K 3.6 3.1*  CL 96 100  CO2 30 30  GLUCOSE 121* 103*  BUN 14 14  CREATININE 0.88 0.84  CALCIUM 9.5 8.6   Liver Function Tests:  Recent Labs Lab 11/15/12 1448  AST 19  ALT 21  ALKPHOS 82  BILITOT 0.3  PROT 7.7  ALBUMIN 3.5    Recent Labs Lab 11/15/12 1448  LIPASE 21   CBC:  Recent Labs Lab 11/15/12 1448 11/16/12 0541  WBC 10.0 6.9  NEUTROABS 5.6  --   HGB 13.1 11.5*  HCT 39.6 34.9*  MCV 74.3* 74.7*  PLT 287 241   CBG:  Recent Labs Lab 11/15/12 2215 11/16/12 0731  GLUCAP 154* 111*    Studies: Ct Abdomen Pelvis W Contrast  11/15/2012  *RADIOLOGY REPORT*  Clinical Data: Left flank pain and left lower quadrant pain. History diverticulitis.  Diabetic hypertensive patient post cholecystectomy and hysterectomy.  CT ABDOMEN AND PELVIS WITH CONTRAST  Technique:  Multidetector CT imaging of  the abdomen and pelvis was performed following the standard protocol during bolus administration of intravenous contrast.  Contrast: 50mL OMNIPAQUE IOHEXOL 300 MG/ML  SOLN, OMNIPAQUE IOHEXOL 300 MG/ML  SOLN  Comparison: 03/13/2010.  Findings: Proximal to mid descending colon diverticulitis.  No drainable abscess.  Scattered diverticula throughout remainder of the descending colon and portions of the sigmoid colon.  No inflammation surrounds the appendix.  No free intraperitoneal air.  Enlarging  right  lobe liver cyst now measuring 11.4 x 9.3 x 8.2 cm versus prior 9.2 x 8.4 x 7.9 cm.  Enlargement left lobe liver cyst measuring up to 1.4 cm.  Fatty infiltration of the liver.  No worrisome splenic, renal, adrenal or pancreatic lesion.  Post cholecystectomy.  Prior hysterectomy.  Mild atherosclerotic type changes of the aorta without aneurysmal dilation.  Lung bases clear.  Degenerative changes lumbar spine L3-4 through L5-S1.  Bilateral hip joint degenerative changes.  Small sclerotic focus left ilium unchanged.  IMPRESSION: Proximal to mid descending colon diverticulitis.  No drainable abscess.  Enlarging  right lobe liver cyst now measuring 11.4 x 9.3 x 8.2 cm versus prior 9.2 x 8.4 x 7.9 cm.  Enlargement left lobe liver cyst measuring up to 1.4 cm.  Fatty infiltration of the liver.  Please see above appear   Original Report Authenticated By: Lacy Duverney, M.D.     Scheduled Meds: . ciprofloxacin  400 mg Intravenous Q12H  . hydrochlorothiazide  25 mg Oral Daily  . insulin aspart  0-9 Units Subcutaneous TID WC  . metroNIDAZOLE  500 mg Intravenous Q6H  . pantoprazole  40 mg Oral Daily  . potassium chloride  10 mEq Oral q morning - 10a  . sodium chloride  3 mL Intravenous Q12H  . Warfarin - Pharmacist Dosing Inpatient   Does not apply q1800   Continuous Infusions:   Principal Problem:   Acute diverticulitis Active Problems:   HYPERTENSION   Diabetes mellitus type 2, diet-controlled     Brendia Sacks, MD  [facility] Pager [phone] If 7PM-7AM, please contact night-coverage at [email or web address], password [facility] 11/16/2012, 9:47 AM  LOS: 1 day   Time spent: 20 minutes

## 2012-11-16 NOTE — Progress Notes (Signed)
UR Chart Review Completed  

## 2012-11-16 NOTE — Progress Notes (Signed)
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  Allergies  Allergen Reactions  . Penicillins Hives    Patient Measurements: Height: 5\' 5"  (165.1 cm) Weight: 242 lb 1 oz (109.8 kg) IBW/kg (Calculated) : 57  Vital Signs: Temp: 97.6 F (36.4 C) (03/20 0558) BP: 120/62 mmHg (03/20 0558) Pulse Rate: 63 (03/20 0558)  Labs:  Recent Labs  11/15/12 1448 11/16/12 0541  HGB 13.1 11.5*  HCT 39.6 34.9*  PLT 287 241  LABPROT 25.2* 25.5*  INR 2.42* 2.46*  CREATININE 0.88 0.84    Estimated Creatinine Clearance: 83.4 ml/min (by C-G formula based on Cr of 0.84).   Medical History: Past Medical History  Diagnosis Date  . Hypertension   . Diverticulitis   . Cyst on liver  . Blood clot of artery under arm   . Diabetes mellitus     controlled by diet    Medications:  Prescriptions prior to admission  Medication Sig Dispense Refill  . Cholecalciferol (VITAMIN D) 2000 UNITS CAPS Take 1 capsule by mouth every morning.      . dicyclomine (BENTYL) 20 MG tablet Take 20 mg by mouth 3 (three) times daily as needed. For stomach pain      . hydrochlorothiazide (HYDRODIURIL) 25 MG tablet Take 25 mg by mouth daily.      . hyoscyamine (LEVSIN SL) 0.125 MG SL tablet Place 0.125 mg under the tongue every 6 (six) hours as needed for cramping or diarrhea or loose stools.      . nitroGLYCERIN (NITROSTAT) 0.4 MG SL tablet Place 0.4 mg under the tongue every 5 (five) minutes x 3 doses as needed.      [name] omeprazole (PRILOSEC) 20 MG capsule Take 20 mg by mouth every morning.       . potassium chloride (K-DUR) 10 MEQ tablet Take 10 mEq by mouth every morning.      . Probiotic Product (PHILLIPS COLON HEALTH) CAPS Take 1 capsule by mouth daily.       [name] warfarin (COUMADIN) 3 MG tablet Take 3-4.5 mg by mouth as directed. Takes one & one-half tablets (4.5mg  TOTAL) on Mondays, Wednesdays, Fridays, Saturdays, and Sundays. Take one tablet (3mg TOTAL) on  Tuesdays and Thursdays. 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 INR at goal.  No bleeding noted.  Acute diverticulitis being treated w/ Cipro and Metronidazole which can increase INR response to warfarin therapy.    Goal of Therapy:  INR 2-3   Plan:  Warfarin 3mg  PO x 1 this evening. Daily PT/INR.  Elson Clan 11/16/2012,10:39 AM

## 2012-11-17 LAB — PROTIME-INR: Prothrombin Time: 25.7 seconds — ABNORMAL HIGH (ref 11.6–15.2)

## 2012-11-17 MED ORDER — WARFARIN SODIUM 2 MG PO TABS: 3.0000 mg | ORAL_TABLET | ORAL | Status: DC

## 2012-11-17 MED ORDER — METRONIDAZOLE 500 MG PO TABS: 500.0000 mg | ORAL_TABLET | Freq: Four times a day (QID) | ORAL | Status: DC

## 2012-11-17 MED ORDER — CIPROFLOXACIN HCL 500 MG PO TABS: 500.0000 mg | ORAL_TABLET | Freq: Two times a day (BID) | ORAL | Status: DC

## 2012-11-17 MED ORDER — CIPROFLOXACIN HCL 250 MG PO TABS: 500.0000 mg | ORAL_TABLET | Freq: Two times a day (BID) | ORAL | Status: DC

## 2012-11-17 MED ORDER — LORAZEPAM 0.5 MG PO TABS: 0.5000 mg | ORAL_TABLET | Freq: Two times a day (BID) | ORAL | Status: DC | PRN

## 2012-11-17 MED ORDER — WARFARIN SODIUM 3 MG PO TABS: 3.0000 mg | ORAL_TABLET | Freq: Every day | ORAL | Status: DC

## 2012-11-17 MED ORDER — HYDROCODONE-ACETAMINOPHEN 5-325 MG PO TABS: 1.0000 | ORAL_TABLET | ORAL | Status: DC | PRN

## 2012-11-17 NOTE — Progress Notes (Signed)
UR Chart Review Completed  

## 2012-11-17 NOTE — Progress Notes (Signed)
Patient being d/c home with prescriptions and instructions. Verbalizes understanding and IV intact  And no pain or swelling at site. Awaiting to be transport out.

## 2012-11-17 NOTE — Progress Notes (Addendum)
[facility] PROGRESS NOTE  [name] ZOX:[id number] DOB: [birth year] DOA: 11/15/2012 PCP: Lilyan Punt, MD  Assessment: 1. Acute diverticulitis: Continues to improve. Remains afebrile, no leukocytosis. No evidence of complicating feature by CT. IV antibiotics, pain control.  2. Diet controlled diabetes: Well controlled. No supplemental insulin required. 3. Hypertension: Stable. Continue hydrochlorothiazide.  4. History of arterial clot and arm: Continue warfarin per pharmacy. INR therapeutic. 5. Normocytic anemia: Stable. Followup as outpatient as needed. 6. Enlarging right/left lobe liver cyst now measuring: Chronic. Followup as outpatient as clinically indicated. Chronic since 2006.  7. Lightheadedness: Check orthostatics. 8. Situational stress: Trial Ativan.  Plan:             1. Check orthostatics  2. Continue antibiotics. Pain control. 3. Plan discharge on warfarin 3 mg daily while on antibiotics as recommended by pharmacy. 4. Likely home later today.    Code Status: Full code Family Communication: None present Disposition Plan: As above  Brendia Sacks, MD  [facility]  Pager [phone] If 7PM-7AM, please contact night-coverage at [email or web address], password [facility] 11/17/2012, 10:25 AM  LOS: 2 days   Brief narrative: 67 year old woman presented with left lower or upper abdominal pain. CT of the abdomen and pelvis revealed acute diverticulitis the patient was referred for IV antibiotics and pain control.  Consultants:  None  Procedures:  None   Antibiotics:  Ciprofloxacin 3/20 >> 3/27  Flagyl 3/20 >> 3/27  HPI/Subjective: Remains afebrile. Vital signs stable. No narcotics since 6 PM 3/20. He had an episode of lightheadedness when standing earlier today and some nausea. No vomiting. At that point had been feeling well without pain, nausea or vomiting.  Objective: Filed Vitals:   11/15/12 2214 11/16/12 0558 11/16/12 1340 11/17/12 0613  BP:  142/56 120/62 109/68 116/47  Pulse:  63 60 61  Temp:  97.6 F (36.4 C) 97.6 F (36.4 C) 97.6 F (36.4 C)  TempSrc:   Oral Oral  Resp:  20 20 20   Height:      Weight:      SpO2:  100% 99% 93%    Intake/Output Summary (Last 24 hours) at 11/17/12 1025 Last data filed at 11/17/12 0700  Gross per 24 hour  Intake    880 ml  Output      0 ml  Net    880 ml   Filed Weights   11/15/12 1408 11/15/12 1857  Weight: 99.791 kg (220 lb) 109.8 kg (242 lb 1 oz)    Exam:  General:  Appears calm and comfortable Cardiovascular: RRR, no m/r/g. No LE edema. Respiratory: CTA bilaterally, no w/r/r. Normal respiratory effort. Abdomen: soft, ntnd Psychiatric: grossly normal mood and affect, speech fluent and appropriate  Exam current 3/21  Data Reviewed: Basic Metabolic Panel:  Recent Labs Lab 11/15/12 1448 11/16/12 0541  NA 135 138  K 3.6 3.1*  CL 96 100  CO2 30 30  GLUCOSE 121* 103*  BUN 14 14  CREATININE 0.88 0.84  CALCIUM 9.5 8.6   Liver Function Tests:  Recent Labs Lab 11/15/12 1448  AST 19  ALT 21  ALKPHOS 82  BILITOT 0.3  PROT 7.7  ALBUMIN 3.5    Recent Labs Lab 11/15/12 1448  LIPASE 21   CBC:  Recent Labs Lab 11/15/12 1448 11/16/12 0541  WBC 10.0 6.9  NEUTROABS 5.6  --   HGB 13.1 11.5*  HCT 39.6 34.9*  MCV 74.3* 74.7*  PLT 287 241   CBG:  Recent Labs Lab 11/16/12 0731 11/16/12 1134  11/16/12 1557 11/16/12 2039 11/17/12 0742  GLUCAP 111* 97 120* 132* 95    Studies: Ct Abdomen Pelvis W Contrast  11/15/2012  *RADIOLOGY REPORT*  Clinical Data: Left flank pain and left lower quadrant pain. History diverticulitis.  Diabetic hypertensive patient post cholecystectomy and hysterectomy.  CT ABDOMEN AND PELVIS WITH CONTRAST  Technique:  Multidetector CT imaging of the abdomen and pelvis was performed following the standard protocol during bolus administration of intravenous contrast.  Contrast: 50mL OMNIPAQUE IOHEXOL 300 MG/ML  SOLN, OMNIPAQUE  IOHEXOL 300 MG/ML  SOLN  Comparison: 03/13/2010.  Findings: Proximal to mid descending colon diverticulitis.  No drainable abscess.  Scattered diverticula throughout remainder of the descending colon and portions of the sigmoid colon.  No inflammation surrounds the appendix.  No free intraperitoneal air.  Enlarging  right lobe liver cyst now measuring 11.4 x 9.3 x 8.2 cm versus prior 9.2 x 8.4 x 7.9 cm.  Enlargement left lobe liver cyst measuring up to 1.4 cm.  Fatty infiltration of the liver.  No worrisome splenic, renal, adrenal or pancreatic lesion.  Post cholecystectomy.  Prior hysterectomy.  Mild atherosclerotic type changes of the aorta without aneurysmal dilation.  Lung bases clear.  Degenerative changes lumbar spine L3-4 through L5-S1.  Bilateral hip joint degenerative changes.  Small sclerotic focus left ilium unchanged.  IMPRESSION: Proximal to mid descending colon diverticulitis.  No drainable abscess.  Enlarging  right lobe liver cyst now measuring 11.4 x 9.3 x 8.2 cm versus prior 9.2 x 8.4 x 7.9 cm.  Enlargement left lobe liver cyst measuring up to 1.4 cm.  Fatty infiltration of the liver.  Please see above appear   Original Report Authenticated By: Lacy Duverney, M.D.     Scheduled Meds: . ciprofloxacin  400 mg Intravenous Q12H  . hydrochlorothiazide  25 mg Oral Daily  . insulin aspart  0-9 Units Subcutaneous TID WC  . metroNIDAZOLE  500 mg Intravenous Q6H  . pantoprazole  40 mg Oral Daily  . potassium chloride  10 mEq Oral q morning - 10a  . sodium chloride  3 mL Intravenous Q12H  . warfarin  3 mg Oral Q24H  . Warfarin - Pharmacist Dosing Inpatient   Does not apply q1800   Continuous Infusions:   Principal Problem:   Acute diverticulitis Active Problems:   HYPERTENSION   Diabetes mellitus type 2, diet-controlled     Brendia Sacks, MD  [facility] Pager [phone] If 7PM-7AM, please contact night-coverage at [email or web address], password [email or web address] 11/17/2012, 10:25 AM  LOS: 2  days

## 2012-11-17 NOTE — Discharge Summary (Signed)
Physician Discharge Summary  [name] OZH:[id number] DOB: 31-Oct-1945 DOA: 11/15/2012  PCP: Kathleen Punt, MD  Admit date: 11/15/2012 Discharge date: 11/17/2012  Recommendations for Outpatient Follow-up:  1. Followup resolution of diverticulitis. 2. Follow PT/INR while on antibiotics.  Lab Results  Component Value Date   INR 2.48* 11/17/2012   INR 2.46* 11/16/2012   INR 2.42* 11/15/2012    3. Followup normocytic anemia as clinically indicated. 4. Followup chronic liver cysts as clinically indicated.   Follow-up Information   Follow up with Kathleen Punt, MD On 11/21/2012. (10 AM)    Contact information:   [address] [place] [address] [phone]      Discharge Diagnoses:  1. Acute diverticulitis 2. Diet controlled diabetes mellitus, stable 3. Normocytic anemia, stable 4. Chronic right/left liver cysts, enlarging  Discharge Condition: Improved Disposition: Home  Diet recommendation: Diabetic diet, avoiding nuts, seeds, popcorn.  Filed Weights   11/15/12 1408 11/15/12 1857  Weight: 99.791 kg (220 lb) 109.8 kg (242 lb 1 oz)    History of present illness:  67 year old woman presented with left lower or upper abdominal pain. CT of the abdomen and pelvis revealed acute diverticulitis the patient was referred for IV antibiotics and pain control.  Hospital Course:  [name] was admitted to the medical floor and treated with empiric antibiotic therapy for acute diverticulitis. Her hospital course was uncomplicated. Her condition rapidly improved and pain markedly decreased. She is tolerating a diet, maintaining hydration and would like to go home.  1. Acute diverticulitis: Remains afebrile, no leukocytosis. No evidence of complicating feature by CT. finish course of antibiotics as an outpatient.   2. Diet controlled diabetes: Well controlled. No supplemental insulin required. 3. Hypertension: Stable. Continue hydrochlorothiazide.  4. History of  arterial clot and arm: Continue warfarin per pharmacy, recommendation for 3 mg daily until followup with primary care physician. 5. Normocytic anemia: Stable. Followup as outpatient as needed. 6. Enlarging right/left lobe liver cyst now measuring: Chronic. Followup as outpatient as clinically indicated. Chronic since 2006.   7. Lightheadedness: Check orthostatics. 8. Situational stress: Trial Ativan.  Consultants:  None  Procedures:  None  Antibiotics:  Ciprofloxacin 3/20 >> 3/27  Flagyl 3/20 >> 3/27  Discharge Instructions  Discharge Orders   Future Appointments Provider Department Dept Phone   11/21/2012 10:00 AM [facility] [phone]   11/24/2012 3:00 PM Claudia Desanctis [facility] [phone]   Future Orders Complete By Expires     Diet Carb Modified  As directed     Discharge instructions  As directed     Comments:      Be sure to finish ciprofloxacin and metronidazole which are antibiotics to treat your acute infection. Avoid nuts, popcorn, seeds. For now take warfarin 3 mg each day until you followup next week for repeat PT/INR checked. Call your physician or seek immediate medical attention for fever, increased pain or worsening of condition.    Increase activity slowly  As directed         Medication List    TAKE these medications       ciprofloxacin 500 MG tablet  Commonly known as:  CIPRO  Take 1 tablet (500 mg total) by mouth 2 (two) times daily.     dicyclomine 20 MG tablet  Commonly known as:  BENTYL  Take 20 mg by mouth 3 (three) times daily as needed. For stomach pain     hydrochlorothiazide 25 MG tablet  Commonly known as:  HYDRODIURIL  Take 25 mg by mouth daily.     HYDROcodone-acetaminophen 5-325 MG per tablet  Commonly known as:  NORCO/VICODIN  Take 1 tablet by mouth every 4 (four) hours as needed for pain.     hyoscyamine 0.125 MG SL tablet  Commonly known as:  LEVSIN SL  Place 0.125 mg under  the tongue every 6 (six) hours as needed for cramping or diarrhea or loose stools.     LORazepam 0.5 MG tablet  Commonly known as:  ATIVAN  Take 1 tablet (0.5 mg total) by mouth every 12 (twelve) hours as needed for anxiety.     metroNIDAZOLE 500 MG tablet  Commonly known as:  FLAGYL  Take 1 tablet (500 mg total) by mouth every 6 (six) hours.     nitroGLYCERIN 0.4 MG SL tablet  Commonly known as:  NITROSTAT  Place 0.4 mg under the tongue every 5 (five) minutes x 3 doses as needed.     omeprazole 20 MG capsule  Commonly known as:  PRILOSEC  Take 20 mg by mouth every morning.     PHILLIPS COLON HEALTH Caps  Take 1 capsule by mouth daily.     potassium chloride 10 MEQ tablet  Commonly known as:  K-DUR  Take 10 mEq by mouth every morning.     Vitamin D 2000 UNITS Caps  Take 1 capsule by mouth every morning.     warfarin 3 MG tablet  Commonly known as:  COUMADIN  Take 1 tablet (3 mg total) by mouth daily.        The results of significant diagnostics from this hospitalization (including imaging, microbiology, ancillary and laboratory) are listed below for reference.    Significant Diagnostic Studies: Ct Abdomen Pelvis W Contrast  11/15/2012  *RADIOLOGY REPORT*  Clinical Data: Left flank pain and left lower quadrant pain. History diverticulitis.  Diabetic hypertensive patient post cholecystectomy and hysterectomy.  CT ABDOMEN AND PELVIS WITH CONTRAST  Technique:  Multidetector CT imaging of the abdomen and pelvis was performed following the standard protocol during bolus administration of intravenous contrast.  Contrast: 50mL OMNIPAQUE IOHEXOL 300 MG/ML  SOLN, OMNIPAQUE IOHEXOL 300 MG/ML  SOLN  Comparison: 03/13/2010.  Findings: Proximal to mid descending colon diverticulitis.  No drainable abscess.  Scattered diverticula throughout remainder of the descending colon and portions of the sigmoid colon.  No inflammation surrounds the appendix.  No free intraperitoneal air.   Enlarging  right lobe liver cyst now measuring 11.4 x 9.3 x 8.2 cm versus prior 9.2 x 8.4 x 7.9 cm.  Enlargement left lobe liver cyst measuring up to 1.4 cm.  Fatty infiltration of the liver.  No worrisome splenic, renal, adrenal or pancreatic lesion.  Post cholecystectomy.  Prior hysterectomy.  Mild atherosclerotic type changes of the aorta without aneurysmal dilation.  Lung bases clear.  Degenerative changes lumbar spine L3-4 through L5-S1.  Bilateral hip joint degenerative changes.  Small sclerotic focus left ilium unchanged.  IMPRESSION: Proximal to mid descending colon diverticulitis.  No drainable abscess.  Enlarging  right lobe liver cyst now measuring 11.4 x 9.3 x 8.2 cm versus prior 9.2 x 8.4 x 7.9 cm.  Enlargement left lobe liver cyst measuring up to 1.4 cm.  Fatty infiltration of the liver.  Please see above appear   Original Report Authenticated By: Lacy Duverney, M.D.     Labs: Basic Metabolic Panel:  Recent Labs Lab 11/15/12 1448 11/16/12 0541  NA 135 138  K 3.6 3.1*  CL 96 100  CO2 30 30  GLUCOSE 121* 103*  BUN 14 14  CREATININE 0.88 0.84  CALCIUM 9.5 8.6   Liver Function Tests:  Recent Labs Lab 11/15/12 1448  AST 19  ALT 21  ALKPHOS 82  BILITOT 0.3  PROT 7.7  ALBUMIN 3.5    Recent Labs Lab 11/15/12 1448  LIPASE 21   CBC:  Recent Labs Lab 11/15/12 1448 11/16/12 0541  WBC 10.0 6.9  NEUTROABS 5.6  --   HGB 13.1 11.5*  HCT 39.6 34.9*  MCV 74.3* 74.7*  PLT 287 241   CBG:  Recent Labs Lab 11/16/12 1134 11/16/12 1557 11/16/12 2039 11/17/12 0742 11/17/12 1211  GLUCAP 97 120* 132* 95 96    Principal Problem:   Acute diverticulitis Active Problems:   HYPERTENSION   Diabetes mellitus type 2, diet-controlled   Time coordinating discharge: 35 minutes  Signed:  Brendia Sacks, MD [facility] 11/17/2012, 3:56 PM

## 2012-11-17 NOTE — Progress Notes (Signed)
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  Allergies  Allergen Reactions  . Penicillins Hives    Patient Measurements: Height: 5\' 5"  (165.1 cm) Weight: 242 lb 1 oz (109.8 kg) IBW/kg (Calculated) : 57  Vital Signs: Temp: 97.6 F (36.4 C) (03/21 0613) Temp src: Oral (03/21 0613) BP: 116/47 mmHg (03/21 0613) Pulse Rate: 61 (03/21 0613)  Labs:  Recent Labs  11/15/12 1448 11/16/12 0541 11/17/12 0524  HGB 13.1 11.5*  --   HCT 39.6 34.9*  --   PLT 287 241  --   LABPROT 25.2* 25.5* 25.7*  INR 2.42* 2.46* 2.48*  CREATININE 0.88 0.84  --     Estimated Creatinine Clearance: 83.4 ml/min (by C-G formula based on Cr of 0.84).   Medical History: Past Medical History  Diagnosis Date  . Hypertension   . Diverticulitis   . Cyst on liver  . Blood clot of artery under arm   . Diabetes mellitus     controlled by diet    Medications:  Prescriptions prior to admission  Medication Sig Dispense Refill  . Cholecalciferol (VITAMIN D) 2000 UNITS CAPS Take 1 capsule by mouth every morning.      . dicyclomine (BENTYL) 20 MG tablet Take 20 mg by mouth 3 (three) times daily as needed. For stomach pain      . hydrochlorothiazide (HYDRODIURIL) 25 MG tablet Take 25 mg by mouth daily.      . hyoscyamine (LEVSIN SL) 0.125 MG SL tablet Place 0.125 mg under the tongue every 6 (six) hours as needed for cramping or diarrhea or loose stools.      . nitroGLYCERIN (NITROSTAT) 0.4 MG SL tablet Place 0.4 mg under the tongue every 5 (five) minutes x 3 doses as needed.      [name] omeprazole (PRILOSEC) 20 MG capsule Take 20 mg by mouth every morning.       . potassium chloride (K-DUR) 10 MEQ tablet Take 10 mEq by mouth every morning.      . Probiotic Product (PHILLIPS COLON HEALTH) CAPS Take 1 capsule by mouth daily.       [name] warfarin (COUMADIN) 3 MG tablet Take 3-4.5 mg by mouth as directed. Takes one & one-half tablets (4.5mg  TOTAL) on Mondays,  Wednesdays, Fridays, Saturdays, and Sundays. Take one tablet (3mg TOTAL) on Tuesdays and Thursdays. 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 INR at goal.  No bleeding noted.  Acute diverticulitis being treated w/ Cipro and Metronidazole which can increase INR response to warfarin therapy.    Goal of Therapy:  INR 2-3   Plan:  Warfarin 3mg  PO x 1 this evening. Daily PT/INR. **If d/c home on Cipro & Flagyl would recommend decreased dose of warfarin 3mg  daily while on these antibiotics.   Elson Clan 11/17/2012,8:12 AM

## 2012-11-21 ENCOUNTER — Ambulatory Visit: Payer: BC Managed Care – PPO

## 2012-11-21 ENCOUNTER — Encounter: Payer: Self-pay | Admitting: *Deleted

## 2012-11-22 ENCOUNTER — Ambulatory Visit: Payer: BC Managed Care – PPO

## 2012-11-22 ENCOUNTER — Encounter: Payer: Self-pay | Admitting: Family Medicine

## 2012-11-22 ENCOUNTER — Ambulatory Visit (INDEPENDENT_AMBULATORY_CARE_PROVIDER_SITE_OTHER): Payer: BC Managed Care – PPO | Admitting: Family Medicine

## 2012-11-22 VITALS — BP 152/74 | Temp 98.9°F | Ht 65.0 in | Wt 243.0 lb

## 2012-11-22 DIAGNOSIS — I10 Essential (primary) hypertension: Secondary | ICD-10-CM

## 2012-11-22 DIAGNOSIS — E119 Type 2 diabetes mellitus without complications: Secondary | ICD-10-CM

## 2012-11-22 DIAGNOSIS — K7689 Other specified diseases of liver: Secondary | ICD-10-CM

## 2012-11-22 DIAGNOSIS — K5792 Diverticulitis of intestine, part unspecified, without perforation or abscess without bleeding: Secondary | ICD-10-CM

## 2012-11-22 DIAGNOSIS — Z79899 Other long term (current) drug therapy: Secondary | ICD-10-CM

## 2012-11-22 DIAGNOSIS — K5732 Diverticulitis of large intestine without perforation or abscess without bleeding: Secondary | ICD-10-CM

## 2012-11-22 LAB — CBC
MCH: 24.1 pg — ABNORMAL LOW (ref 26.0–34.0)
MCHC: 32.7 g/dL (ref 30.0–36.0)
MCV: 73.5 fL — ABNORMAL LOW (ref 78.0–100.0)
Platelets: 403 10*3/uL — ABNORMAL HIGH (ref 150–400)
RBC: 5.36 MIL/uL — ABNORMAL HIGH (ref 3.87–5.11)

## 2012-11-22 LAB — POCT INR: INR: 1.9

## 2012-11-22 NOTE — Patient Instructions (Signed)
Continue all your medicines as is. See the hematologist on Friday. More than likely they will take you off of the Coumadin. I would like to see back in one month. Call us if any severe abdominal pain fevers vomiting or other problems arise.      Diverticulitis A diverticulum is a small pouch or sac on the colon. Diverticulosis is the presence of these diverticula on the colon. Diverticulitis is the irritation (inflammation) or infection of diverticula. CAUSES  The colon and its diverticula contain bacteria. If food particles block the tiny opening to a diverticulum, the bacteria inside can grow and cause an increase in pressure. This leads to infection and inflammation and is called diverticulitis. SYMPTOMS   Abdominal pain and tenderness. Usually, the pain is located on the left side of your abdomen. However, it could be located elsewhere.  Fever.  Bloating.  Feeling sick to your stomach (nausea).  Throwing up (vomiting).  Abnormal stools. DIAGNOSIS  Your caregiver will take a history and perform a physical exam. Since many things can cause abdominal pain, other tests may be necessary. Tests may include:  Blood tests.  Urine tests.  X-ray of the abdomen.  CT scan of the abdomen. Sometimes, surgery is needed to determine if diverticulitis or other conditions are causing your symptoms. TREATMENT  Most of the time, you can be treated without surgery. Treatment includes:  Resting the bowels by only having liquids for a few days. As you improve, you will need to eat a low-fiber diet.  Intravenous (IV) fluids if you are losing body fluids (dehydrated).  Antibiotic medicines that treat infections may be given.  Pain and nausea medicine, if needed.  Surgery if the inflamed diverticulum has burst. HOME CARE INSTRUCTIONS   Try a clear liquid diet (broth, tea, or water for as long as directed by your caregiver). You may then gradually begin a low-fiber diet as tolerated. A  low-fiber diet is a diet with less than 10 grams of fiber. Choose the foods below to reduce fiber in the diet:  White breads, cereals, rice, and pasta.  Cooked fruits and vegetables or soft fresh fruits and vegetables without the skin.  Ground or well-cooked tender beef, ham, veal, lamb, pork, or poultry.  Eggs and seafood.  After your diverticulitis symptoms have improved, your caregiver may put you on a high-fiber diet. A high-fiber diet includes 14 grams of fiber for every 1000 calories consumed. For a standard 2000 calorie diet, you would need 28 grams of fiber. Follow these diet guidelines to help you increase the fiber in your diet. It is important to slowly increase the amount fiber in your diet to avoid gas, constipation, and bloating.  Choose whole-grain breads, cereals, pasta, and brown rice.  Choose fresh fruits and vegetables with the skin on. Do not overcook vegetables because the more vegetables are cooked, the more fiber is lost.  Choose more nuts, seeds, legumes, dried peas, beans, and lentils.  Look for food products that have greater than 3 grams of fiber per serving on the Nutrition Facts label.  Take all medicine as directed by your caregiver.  If your caregiver has given you a follow-up appointment, it is very important that you go. Not going could result in lasting (chronic) or permanent injury, pain, and disability. If there is any problem keeping the appointment, call to reschedule. SEEK MEDICAL CARE IF:   Your pain does not improve.  You have a hard time advancing your diet beyond clear liquids.  Your bowel movements do not return to normal. SEEK IMMEDIATE MEDICAL CARE IF:   Your pain becomes worse.  You have an oral temperature above 102 F (38.9 C), not controlled by medicine.  You have repeated vomiting.  You have bloody or black, tarry stools.  Symptoms that brought you to your caregiver become worse or are not getting better. MAKE SURE YOU:    Understand these instructions.  Will watch your condition.  Will get help right away if you are not doing well or get worse. Document Released: 05/26/2005 Document Revised: 11/08/2011 Document Reviewed: 09/21/2010 [facility] Patient Information 2013 [place], [place]. Diabetes Meal Planning Guide The diabetes meal planning guide is a tool to help you plan your meals and snacks. It is important for people with diabetes to manage their blood glucose (sugar) levels. Choosing the right foods and the right amounts throughout your day will help control your blood glucose. Eating right can even help you improve your blood pressure and reach or maintain a healthy weight. CARBOHYDRATE COUNTING MADE EASY When you eat carbohydrates, they turn to sugar. This raises your blood glucose level. Counting carbohydrates can help you control this level so you feel better. When you plan your meals by counting carbohydrates, you can have more flexibility in what you eat and balance your medicine with your food intake. Carbohydrate counting simply means adding up the total amount of carbohydrate grams in your meals and snacks. Try to eat about the same amount at each meal. Foods with carbohydrates are listed below. Each portion below is 1 carbohydrate serving or 15 grams of carbohydrates. Ask your dietician how many grams of carbohydrates you should eat at each meal or snack. Grains and Starches  1 slice bread.   English muffin or hotdog/hamburger bun.   cup cold cereal (unsweetened).   cup cooked pasta or rice.   cup starchy vegetables (corn, potatoes, peas, beans, winter squash).  1 tortilla (6 inches).   bagel.  1 waffle or pancake (size of a CD).   cup cooked cereal.  4 to 6 small crackers. *Whole grain is recommended. Fruit  1 cup fresh unsweetened berries, melon, papaya, pineapple.  1 small fresh fruit.   banana or mango.   cup fruit juice (4 oz unsweetened).   cup canned fruit in  natural juice or water.  2 tbs dried fruit.  12 to 15 grapes or cherries. Milk and Yogurt  1 cup fat-free or 1% milk.  1 cup soy milk.  6 oz light yogurt with sugar-free sweetener.  6 oz low-fat soy yogurt.  6 oz plain yogurt. Vegetables  1 cup raw or  cup cooked is counted as 0 carbohydrates or a "free" food.  If you eat 3 or more servings at 1 meal, count them as 1 carbohydrate serving. Other Carbohydrates   oz chips or pretzels.   cup ice cream or frozen yogurt.   cup sherbet or sorbet.  2 inch square cake, no frosting.  1 tbs honey, sugar, jam, jelly, or syrup.  2 small cookies.  3 squares of graham crackers.  3 cups popcorn.  6 crackers.  1 cup broth-based soup.  Count 1 cup casserole or other mixed foods as 2 carbohydrate servings.  Foods with less than 20 calories in a serving may be counted as 0 carbohydrates or a "free" food. You may want to purchase a book or computer software that lists the carbohydrate gram counts of different foods. In addition, the nutrition facts panel on the  labels of the foods you eat are a good source of this information. The label will tell you how big the serving size is and the total number of carbohydrate grams you will be eating per serving. Divide this number by 15 to obtain the number of carbohydrate servings in a portion. Remember, 1 carbohydrate serving equals 15 grams of carbohydrate. SERVING SIZES Measuring foods and serving sizes helps you make sure you are getting the right amount of food. The list below tells how big or small some common serving sizes are.  1 oz.........4 stacked dice.  3 oz........Marland KitchenDeck of cards.  1 tsp.......Marland KitchenTip of little finger.  1 tbs......Marland KitchenMarland KitchenThumb.  2 tbs.......Marland KitchenGolf ball.   cup......Marland KitchenHalf of a fist.  1 cup.......Marland KitchenA fist. SAMPLE DIABETES MEAL PLAN Below is a sample meal plan that includes foods from the grain and starches, dairy, vegetable, fruit, and meat groups. A dietician  can individualize a meal plan to fit your calorie needs and tell you the number of servings needed from each food group. However, controlling the total amount of carbohydrates in your meal or snack is more important than making sure you include all of the food groups at every meal. You may interchange carbohydrate containing foods (dairy, starches, and fruits). The meal plan below is an example of a 2000 calorie diet using carbohydrate counting. This meal plan has 17 carbohydrate servings. Breakfast  1 cup oatmeal (2 carb servings).   cup light yogurt (1 carb serving).  1 cup blueberries (1 carb serving).   cup almonds. Snack  1 large apple (2 carb servings).  1 low-fat string cheese stick. Lunch  Chicken breast salad.  1 cup spinach.   cup chopped tomatoes.  2 oz chicken breast, sliced.  2 tbs low-fat [place] dressing.  12 whole-wheat crackers (2 carb servings).  12 to 15 grapes (1 carb serving).  1 cup low-fat milk (1 carb serving). Snack  1 cup carrots.   cup hummus (1 carb serving). Dinner  3 oz broiled salmon.  1 cup brown rice (3 carb servings). Snack  1  cups steamed broccoli (1 carb serving) drizzled with 1 tsp olive oil and lemon juice.  1 cup light pudding (2 carb servings). DIABETES MEAL PLANNING WORKSHEET Your dietician can use this worksheet to help you decide how many servings of foods and what types of foods are right for you.  BREAKFAST Food Group and Servings / Carb Servings Grain/Starches __________________________________ Dairy __________________________________________ Vegetable ______________________________________ Fruit ___________________________________________ Meat __________________________________________ Fat ____________________________________________ LUNCH Food Group and Servings / Carb Servings Grain/Starches ___________________________________ Dairy ___________________________________________ Fruit  ____________________________________________ Meat ___________________________________________ Fat _____________________________________________ Laural Golden Food Group and Servings / Carb Servings Grain/Starches ___________________________________ Dairy ___________________________________________ Fruit ____________________________________________ Meat ___________________________________________ Fat _____________________________________________ SNACKS Food Group and Servings / Carb Servings Grain/Starches ___________________________________ Dairy ___________________________________________ Vegetable _______________________________________ Fruit ____________________________________________ Meat ___________________________________________ Fat _____________________________________________ DAILY TOTALS Starches _________________________ Vegetable ________________________ Fruit ____________________________ Dairy ____________________________ Meat ____________________________ Fat ______________________________ Document Released: 05/13/2005 Document Revised: 11/08/2011 Document Reviewed: 03/24/2009 [employer] Patient Information 2013 [place], [place].

## 2012-11-22 NOTE — Progress Notes (Signed)
  Subjective:    Patient ID: [name], female    DOB: 05-Jul-1946, 67 y.o.   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  She hasn't no history of prior DVTs, prior diverticulitis has family history diabetes. Review of Systems    see above Objective:   Physical Exam  Lungs clear heart regular pulse normal blood pressure good on recheck abdomen soft no guarding rebound or tenderness extremities no edema      Assessment & Plan:  Encounter for long-term (current) use of other medications - Plan: Protime-INR, Protime-INR, POCT INR  HYPERTENSION  HEPATIC CYST  Diabetes mellitus type 2, diet-controlled - Plan: Hemoglobin A1c, Glucose, random  Acute diverticulitis - Plan: CBC  40 minutes was spent with patient going over her tests from the hospitalization plus also a answering numerous questions about diabetes diverticulitis blood clots. She is to stay on her Coumadin until she sees a hematologist. Most likely they will take her off of the medicine and then most likely due hypercoagulable panel in 2 weeks he may or may not choose to do a scan of her chest. She was also advised low sugar diet regular exercise and to do her lab work she is also advised to keep up with all of her medicines and she is to advised to followupin one month's time for recheck

## 2012-11-24 ENCOUNTER — Encounter (HOSPITAL_COMMUNITY): Payer: BC Managed Care – PPO | Attending: Oncology | Admitting: Oncology

## 2012-11-24 ENCOUNTER — Encounter (HOSPITAL_COMMUNITY): Payer: Self-pay | Admitting: Oncology

## 2012-11-24 VITALS — BP 141/73 | HR 60 | Temp 98.0°F | Resp 18 | Ht 65.0 in | Wt 244.1 lb

## 2012-11-24 DIAGNOSIS — I82A19 Acute embolism and thrombosis of unspecified axillary vein: Secondary | ICD-10-CM

## 2012-11-24 DIAGNOSIS — I82A11 Acute embolism and thrombosis of right axillary vein: Secondary | ICD-10-CM

## 2012-11-24 HISTORY — DX: Acute embolism and thrombosis of right axillary vein: I82.A11

## 2012-11-24 NOTE — Patient Instructions (Addendum)
[facility] Discharge Instructions  RECOMMENDATIONS MADE BY THE CONSULTANT AND ANY TEST RESULTS WILL BE SENT TO YOUR REFERRING PHYSICIAN.  Continue current Coumadin dose as prescribed. We will get you scheduled for an ultrasound of your arm the beginning of next week. If ultrasound is negative we will contact you to stop Coumadin. We will schedule you to come back in apx 3 weeks for additional lab work. Return appointment with MD in 5 weeks.  Thank you for choosing [facility] to provide your oncology and hematology care.  To afford each patient quality time with our providers, please arrive at least 15 minutes before your scheduled appointment time.  With your help, our goal is to use those 15 minutes to complete the necessary work-up to ensure our physicians have the information they need to help with your evaluation and healthcare recommendations.    Effective January 1st, 2014, we ask that you re-schedule your appointment with our physicians should you arrive 10 or more minutes late for your appointment.  We strive to give you quality time with our providers, and arriving late affects you and other patients whose appointments are after yours.    Again, thank you for choosing [facility].  Our hope is that these requests will decrease the amount of time that you wait before being seen by our physicians.       _____________________________________________________________  Should you have questions after your visit to [facility], please contact our office at [phone] between the hours of 8:30 a.m. and 5:00 p.m.  Voicemails left after 4:30 p.m. will not be returned until the following business day.  For prescription refill requests, have your pharmacy contact our office with your prescription refill request.

## 2012-11-24 NOTE — Progress Notes (Signed)
[facility] NEW PATIENT EVALUATION   Name: [name] Date: 11/24/2012 MRN: [id number] DOB: 1946-02-17    CC: Lilyan Punt, MD     DIAGNOSIS: The encounter diagnosis was DVT of axillary vein, acute right.   HISTORY OF PRESENT ILLNESS:[name] is a 67 y.o. African American female who is referred to the [facility] for evaluation of length of therapy of anticoagulation secondary to right upper extremity DVT diagnosed in 01/28/2012. She has a past medical history significant for irritable bowel syndrome, hypertension, osteoarthritis, cyst on liver, and acute diverticulitis last week.  The patient reports that in the springtime of 2013, she and her husband went to [facility] for a trip. She returned and over a weekend in May, she thought she got a bug bite. She had a right arm knot that turned "blue". She had associated right arm pain and edema. She denies any erythema. It did not resolve over the weekend and actually got more painful. As a result she presented to her primary care physician's office. He referred her to the [facility] for a right upper extremity Doppler ultrasound to evaluate for DVT in 01/12/2012. The ultrasound was negative. The discomfort and edema worsened and she was re-referred back to the hospital for a subsequent ultrasound. On 01/28/2012 she was diagnosed with a mild DVT in the right axillary vein with mural thrombus but the vein remained patent.  She denies any venipunctures in that arm near the time of diagnosis or the month before. She denies a family history of blood clots. Her most recent surgery was in 2012.  To his recent discharge from the [facility] for an acute diverticulitis flare. She was treated appropriately and was discharged with Flagyl and Cipro as antibiotics. She is recovering from that but she does continue to have left lower quadrant discomfort on palpation. This is improving.  She  denies any headaches dizziness double vision, fevers, chills, night sweats, nausea, vomiting, diarrhea, constipation, abdominal pain, chest pain, heart palpitations, shortness of breath, increased fatigue, increased weakness, blood in stool, black tarry stool, urinary pain, urinary burning, urinary frequency, hematuria, spontaneous bleeding, gingival bleeding, rectal bleeding, blood per rectum.    she is tolerating Coumadin well but does admit to changes in her dose to keep her INR and goal. Most recently, INR was therapeutic.   FAMILY HISTORY: family history includes Cancer in her sister; Diabetes in her brother and sister; Heart attack in her mother; Heart disease in her brother; Seizures in her brother; and Stroke in her mother.   she denies a family history of blood clots. She missed a family history of hypertension, diabetes, and heart disease.  Mother is deceased at the age of 54 from "old age" and aneurysm. Father is deceased at the age of 34 due to coronary artery disease. She is 13 siblings in all, a sisters and 5 brothers.  Sister deceased at age 10 from myocardial infarction. Sister 53 deceased from natural causes she also had heart disease. Brother deceased at the age of 72 due to cancer. Patient is unknown kind of cancer. Brother deceased at age 52 from heart disease. Brother deceased at the age of 32 from renal failure and aneurysm of the brain. He was on hemodialysis.  Son 40 years old with diabetes mellitus Daughter 28 and healthy Son 35 and healthy  2 grandchildren both of whom are healthy.  PAST MEDICAL HISTORY:  has a past medical history of Hypertension; Diverticulitis;  Cyst (on liver); Blood clot of artery under arm; Diabetes mellitus; Menopause; Fibrocystic breast disease; Impaired glucose tolerance; Hepatic cyst; IBS (irritable bowel syndrome); and DVT of axillary vein, acute right (11/24/2012).       CURRENT MEDICATIONS: See CHL   SOCIAL HISTORY:  Patient is from  [place], [place]. She completed 11 grade of high school. She works as a Architectural technologist. She's been married for 47 years. She lives at home with her husband and 2 sons. She denies any alcohol, tobacco, or illicit drug abuse.   ALLERGIES: Norvasc; Penicillins; and Advair diskus   LABORATORY DATA:  CBC    Component Value Date/Time   WBC 7.5 11/22/2012 0953   RBC 5.36* 11/22/2012 0953   HGB 12.9 11/22/2012 0953   HCT 39.4 11/22/2012 0953   PLT 403* 11/22/2012 0953   MCV 73.5* 11/22/2012 0953   MCH 24.1* 11/22/2012 0953   MCHC 32.7 11/22/2012 0953   RDW 17.0* 11/22/2012 0953   LYMPHSABS 3.3 11/15/2012 1448   MONOABS 1.0 11/15/2012 1448   EOSABS 0.0 11/15/2012 1448   BASOSABS 0.0 11/15/2012 1448      Chemistry      Component Value Date/Time   NA 138 11/16/2012 0541   K 3.1* 11/16/2012 0541   CL 100 11/16/2012 0541   CO2 30 11/16/2012 0541   BUN 14 11/16/2012 0541   CREATININE 0.84 11/16/2012 0541      Component Value Date/Time   CALCIUM 8.6 11/16/2012 0541   ALKPHOS 82 11/15/2012 1448   AST 19 11/15/2012 1448   ALT 21 11/15/2012 1448   BILITOT 0.3 11/15/2012 1448     Lab Results  Component Value Date   INR 1.9 11/22/2012   INR 2.48* 11/17/2012   INR 2.46* 11/16/2012     RADIOGRAPHY:  11/15/2012  *RADIOLOGY REPORT*  Clinical Data: Left flank pain and left lower quadrant pain.  History diverticulitis. Diabetic hypertensive patient post  cholecystectomy and hysterectomy.  CT ABDOMEN AND PELVIS WITH CONTRAST  Technique: Multidetector CT imaging of the abdomen and pelvis was  performed following the standard protocol during bolus  administration of intravenous contrast.  Contrast: 50mL OMNIPAQUE IOHEXOL 300 MG/ML SOLN, OMNIPAQUE  IOHEXOL 300 MG/ML SOLN  Comparison: 03/13/2010.  Findings: Proximal to mid descending colon diverticulitis. No  drainable abscess.  Scattered diverticula throughout remainder of the descending colon  and portions of the sigmoid colon. No  inflammation surrounds the  appendix. No free intraperitoneal air.  Enlarging right lobe liver cyst now measuring 11.4 x 9.3 x 8.2 cm  versus prior 9.2 x 8.4 x 7.9 cm. Enlargement left lobe liver cyst  measuring up to 1.4 cm. Fatty infiltration of the liver.  No worrisome splenic, renal, adrenal or pancreatic lesion. Post  cholecystectomy. Prior hysterectomy.  Mild atherosclerotic type changes of the aorta without aneurysmal  dilation.  Lung bases clear.  Degenerative changes lumbar spine L3-4 through L5-S1. Bilateral  hip joint degenerative changes. Small sclerotic focus left ilium  unchanged.  IMPRESSION:  Proximal to mid descending colon diverticulitis. No drainable  abscess.  Enlarging right lobe liver cyst now measuring 11.4 x 9.3 x 8.2 cm  versus prior 9.2 x 8.4 x 7.9 cm. Enlargement left lobe liver cyst  measuring up to 1.4 cm. Fatty infiltration of the liver.  Please see above appear  Original Report Authenticated By: Lacy Duverney, M.D.    01/28/2012  *RADIOLOGY REPORT*  Clinical Data: Right arm pain and swelling.  RIGHT UPPER EXTREMITY VENOUS ULTRASOUND  Comparison: Right arm Doppler 01/12/2012  Findings: The axillary vein is mildly abnormal. There is  thickening of the wall which does not completely compress. This  causes mild narrowing of the lumen and is compatible with venous  thrombosis. This is not identified on the prior ultrasound. There  is diminished augmentation.  The remainder of the deep venous system on the right arm is normal.  The jugular vein, subclavian vein, and basilic and brachial veins  are normal. The radial and ulnar veins are normal. Mild  thickening is noted in the cephalic vein in the midportion.  IMPRESSION:  Mild DVT in the right axillary vein. The vein remains patent but  is not completely compressible. The lumen is narrowed due to mural  thrombus.  Original Report Authenticated By: Camelia Phenes, M.D.    01/12/2012  *RADIOLOGY  REPORT*  Clinical Data: Right upper extremity pain and edema.  RIGHT UPPER EXTREMITY VENOUS DUPLEX ULTRASOUND  Technique: Gray-scale sonography with graded compression, as well  as color Doppler and duplex ultrasound were performed to evaluate  the upper extremity deep venous system from the level of the  subclavian vein and including the jugular, axillary, basilic and  upper cephalic vein. Spectral Doppler was utilized to evaluate  flow at rest and with distal augmentation maneuvers.  Comparison: None.  Findings: Normal compressibility of the upper extremity deep veins  is demonstrated. No venous filling defects visualized on grayscale  or color Doppler US. Normal direction of flow is seen throughout  the deep veins. Spectral Doppler waveforms show normal morphology  at rest and with distal augmentation.  IMPRESSION:  No evidence of upper extremity deep venous thrombosis.  Original Report Authenticated By: Reola Calkins, M.D.   REVIEW OF SYSTEMS: Patient reports no health concerns.   PHYSICAL EXAM:  height is 5\' 5"  (1.651 m) and weight is 244 lb 1.6 oz (110.723 kg). Her oral temperature is 98 F (36.7 C). Her blood pressure is 141/73 and her pulse is 60. Her respiration is 18.  General appearance: alert, cooperative, appears stated age, no distress and moderately obese Head: Normocephalic, without obvious abnormality, atraumatic Neck: no adenopathy, supple, symmetrical, trachea midline and thyroid not enlarged, symmetric, no tenderness/mass/nodules Lymph nodes: Cervical, supraclavicular, and axillary nodes normal. Resp: clear to auscultation bilaterally and normal percussion bilaterally Back: symmetric, no curvature. ROM normal. No CVA tenderness. Cardio: regular rate and rhythm, S1, S2 normal, no murmur, click, rub or gallop GI: soft, non-tender; bowel sounds normal; no masses,  no organomegaly and mild LLQ discomfort Extremities: extremities normal, atraumatic, no cyanosis or  edema and mild right UE edema Neurologic: Alert and oriented X 3, normal strength and tone. Normal symmetric reflexes. Normal coordination and gait     IMPRESSION:  1. Right UE Mild DVT in the axillary vein, mural thrombus, patent.  Unprovoked. Diagnosed on 01/27/2013 on [place].  Treated since then with Coumadin anticoagulation.  Only easy identifiable risk factor at this time is obesity. 2. Fatty infiltration of liver 3. Obese 4. Irritable bowel syndrome 5. Hypertension 6. Osteoarthritis 7. Cyst on liver 8. Acute diverticulitis    PLAN:  1. I personally reviewed and went over laboratory results with the patient. 2. I personally reviewed and went over radiographic studies with the patient. 3. Continue Coumadin until further directed. If [place] is clear, will D/C Coumadin  4. Right UE doppler US next week. 5. Labs in 2-3 weeks: CBC diff, CMET, Hypercoag panel 6. Return in 4 weeks for follow-up  All questions answered.  Patient knows to call the clinic with any questions or concerns.  Patient and plan discussed with Dr. Glenford Peers and he is in agreement with the aforementioned.  Patient seen and examined by Dr. Mariel Sleet as well.   KEFALAS,THOMAS

## 2012-11-28 ENCOUNTER — Ambulatory Visit (HOSPITAL_COMMUNITY)
Admission: RE | Admit: 2012-11-28 | Discharge: 2012-11-28 | Disposition: A | Discharge status: Home / Self Care | Payer: BC Managed Care – PPO | Source: Ambulatory Visit | Attending: Oncology | Admitting: Oncology

## 2012-11-28 DIAGNOSIS — I82629 Acute embolism and thrombosis of deep veins of unspecified upper extremity: Secondary | ICD-10-CM | POA: Insufficient documentation

## 2012-11-28 DIAGNOSIS — I82A11 Acute embolism and thrombosis of right axillary vein: Secondary | ICD-10-CM

## 2012-11-29 ENCOUNTER — Other Ambulatory Visit (HOSPITAL_COMMUNITY): Payer: Self-pay | Admitting: Oncology

## 2012-11-30 NOTE — Progress Notes (Signed)
Kathleen Punt, MD [address] [place] [address]  DVT of axillary vein, acute right - Plan: CT Chest W Contrast, Rivaroxaban (XARELTO) 15 MG TABS tablet, Rivaroxaban (XARELTO) 20 MG TABS  CURRENT THERAPY: Anticoagulation with Coumadin  INTERVAL HISTORY: [name] 67 y.o. female returns for  regular  visit for followup of  right upper extremity DVT diagnosed in 01/28/2012.  The patient was seen as a new patient on 11/24/2012.  She has a right UE [place] on 01/12/12 which was negative, but a subsequent [place] was positive on 01/28/12.  She was promptly anticoagulated appropriately on Coumadin following Lovenox bridging and this was managed very well by PCP with only 1 instance of subtherapeutic INR which was quickly address and corrected early in her treatment.  We developed a plan of action for her to continue Coumadin until further directed after a right UE doppler was performed as a new baseline and also confirm resolution.  That report follows, but it did show resolution of the original DVT, but there was a new Right subclavian acute thrombosis.  She was therefore informed to continue with Coumadin and she was worked into the clinic schedule sooner than previously planned.   She was admitted to the [facility] on 3/19-3/21/14 for acute diverticulitis.   I personally reviewed and went over radiographic studies with the patient.  Right axillary vein DVT resolved, but she has a new right subclavian DVT that is occlusive.   As a result, we will call her a Coumadin failure since her INR is therapeutic.   We will switch her to Xarelto 15 mg BID x 21 days followed by 20 mg daily thereafter until further directed.  We discussed the risks, benefits, alternatives, and side effects of therapy including increased risk for bleeding.  It was discussed that there is not a reversal mechanism for this medication.  Xarelto has an increased risk of GI bleeding, but lower risk of  intracranial bleeding compared to Coumadin. She was advised to use her seatbelt.    CT of chest with contrast to evaluate for intrinsic compression of vein and/or occult malignancy.  I discussed the reason for this test and she is agreeable for this scan.  We will likely have to get this approved through her insurance.   She denies any other complaints.  She denies any bleeding.    Past Medical History  Diagnosis Date  . Hypertension   . Diverticulitis   . Cyst on liver  . Blood clot of artery under arm   . Diabetes mellitus     controlled by diet  . Menopause   . Fibrocystic breast disease   . Impaired glucose tolerance   . Hepatic cyst   . IBS (irritable bowel syndrome)   . DVT of axillary vein, acute right 11/24/2012    Dx on Jan 28, 2012.      has HYPERCHOLESTEROLEMIA; DEPRESSION; HYPERTENSION; PEPTIC ULCER DISEASE; HIATAL HERNIA; CONSTIPATION; HEPATIC CYST; HEMATEMESIS; OSTEOARTHRITIS; NAUSEA ALONE; ABDOMINAL PAIN, CHRONIC; Chest pain, unspecified; Hypokalemia; Bradycardia; Giddiness; Bilateral ankle fractures; Contusion of shoulder, right; Ankle fracture; Ankle sprain; Diabetes mellitus type 2, diet-controlled; Acute diverticulitis; and DVT of axillary vein, acute right on her problem list.     is allergic to norvasc; penicillins; and advair diskus.  [name] does not currently have medications on file.  Past Surgical History  Procedure Laterality Date  . Back surgery    . Cholecystectomy    . Knee arthrocentesis  left  .  Breast surgery  right    cyst removed  . Abdominal hysterectomy    . Tubal ligation    . Cast application  02/14/2012    Procedure: CAST APPLICATION;  Surgeon: Vickki Hearing, MD;  Location: [facility];  Service: Orthopedics;  Laterality: Right;  procedure room   . Colonoscopy      Denies any headaches, dizziness, double vision, fevers, chills, night sweats, nausea, vomiting, diarrhea, constipation, chest pain, heart palpitations, shortness of  breath, blood in stool, black tarry stool, urinary pain, urinary burning, urinary frequency, hematuria.   PHYSICAL EXAMINATION  ECOG PERFORMANCE STATUS: 0 - Asymptomatic  Filed Vitals:   12/01/12 1206  BP: 130/80  Pulse: 52    GENERAL:alert, no distress, well nourished, well developed, comfortable, cooperative, obese and smiling SKIN: skin color, texture, turgor are normal, no rashes or significant lesions HEAD: Normocephalic, No masses, lesions, tenderness or abnormalities EYES: normal, Conjunctiva are pink and non-injected EARS: External ears normal OROPHARYNX:mucous membranes are moist  NECK: supple, trachea midline LYMPH:  not examined BREAST:not examined LUNGS: clear to auscultation  HEART: regular rate & rhythm, no murmurs, no gallops, S1 normal and S2 normal ABDOMEN:abdomen soft, non-tender, obese and normal bowel sounds BACK: Back symmetric, no curvature. EXTREMITIES:no joint deformities, effusion, or inflammation, no edema, no skin discoloration, no cyanosis  NEURO: alert & oriented x 3 with fluent speech, no focal motor/sensory deficits, gait normal   LABORATORY DATA: CBC    Component Value Date/Time   WBC 7.5 11/22/2012 0953   RBC 5.36* 11/22/2012 0953   HGB 12.9 11/22/2012 0953   HCT 39.4 11/22/2012 0953   PLT 403* 11/22/2012 0953   MCV 73.5* 11/22/2012 0953   MCH 24.1* 11/22/2012 0953   MCHC 32.7 11/22/2012 0953   RDW 17.0* 11/22/2012 0953   LYMPHSABS 3.3 11/15/2012 1448   MONOABS 1.0 11/15/2012 1448   EOSABS 0.0 11/15/2012 1448   BASOSABS 0.0 11/15/2012 1448      RADIOGRAPHIC STUDIES:  11/28/2012  *RADIOLOGY REPORT*  Clinical Data: Right upper extremity DVT  RIGHT UPPER EXTREMITY VENOUS DUPLEX ULTRASOUND  Technique: Gray-scale sonography with graded compression, as well  as color Doppler and duplex ultrasound were performed to evaluate  the upper extremity deep venous system from the level of the  subclavian vein and including the jugular, axillary, basilic  and  upper cephalic vein. Spectral Doppler was utilized to evaluate  flow at rest and with distal augmentation maneuvers.  Comparison: 01/28/2012  Findings: The right internal jugular vein remains widely patent.  Right subclavian vein demonstrates a new echogenic intraluminal  thrombus appearing occlusive and noncompressible compatible with  interval development of right subclavian DVT. Right axillary  nonocclusive DVT demonstrates interval near complete resolution.  Peripherally, the right cephalic, basilic, brachial, radial and  ulnar veins are all patent with normal compressibility and  phasicity. Augmentation not performed.  IMPRESSION:  Interval near complete resolution of the right axillary  nonocclusive DVT  Unfortunately, there has been development of new thrombus in the  right subclavian vein more centrally which appears occlusive.  Original Report Authenticated By: Judie Petit. Shick, M.D.     ASSESSMENT:  1. Right UE Mild DVT in the axillary vein, mural thrombus, patent. Unprovoked. Diagnosed on 01/27/2013 on [place]. Treated since then with Coumadin anticoagulation. Only easy identifiable risk factor at this time is obesity.  2. New acute right subclavian DVT that is occlusive. 3. Coumadin failure, will switch to Xarelto 4. Fatty infiltration of liver  5. Obese  6. Irritable  bowel syndrome  7. Hypertension  8. Osteoarthritis  9. Cyst on liver  10. Acute diverticulitis    PLAN:  1. I personally reviewed and went over radiographic studies with the patient. 2. I personally reviewed and went over radiographic studies with the patient. 3. Patient education regarding new findings.  She will be deemed a Coumadin failure. 4. Will switch patient to 15 mg Xarelto BID x 21 days followed by 20 mg daily thereafter. 5. D/C Coumadin 6. CT of chest with contrast within the next 2 weeks to evaluate for extrinsic compression of vein and/or occult malignancy. 7. Hypercoag panel as scheduled 8.  Patient education provided. 9. Patient education regarding Xarelto provided.   10. Return as scheduled.    All questions were answered. The patient knows to call the clinic with any problems, questions or concerns. We can certainly see the patient much sooner if necessary.  The patient and plan discussed with Glenford Peers, MD and he is in agreement with the aforementioned.   [name]

## 2012-12-01 ENCOUNTER — Encounter (HOSPITAL_COMMUNITY): Payer: BC Managed Care – PPO | Attending: Oncology | Admitting: Oncology

## 2012-12-01 VITALS — BP 130/80 | HR 52

## 2012-12-01 DIAGNOSIS — Z86718 Personal history of other venous thrombosis and embolism: Secondary | ICD-10-CM

## 2012-12-01 DIAGNOSIS — I82B19 Acute embolism and thrombosis of unspecified subclavian vein: Secondary | ICD-10-CM

## 2012-12-01 DIAGNOSIS — I82A11 Acute embolism and thrombosis of right axillary vein: Secondary | ICD-10-CM

## 2012-12-01 DIAGNOSIS — Z7901 Long term (current) use of anticoagulants: Secondary | ICD-10-CM

## 2012-12-01 DIAGNOSIS — I82A19 Acute embolism and thrombosis of unspecified axillary vein: Secondary | ICD-10-CM | POA: Insufficient documentation

## 2012-12-01 MED ORDER — RIVAROXABAN 20 MG PO TABS: 20.0000 mg | ORAL_TABLET | Freq: Every day | ORAL | Status: DC

## 2012-12-01 MED ORDER — RIVAROXABAN 15 MG PO TABS: ORAL_TABLET | ORAL | Status: DC

## 2012-12-01 NOTE — Patient Instructions (Addendum)
.  [facility] Discharge Instructions  RECOMMENDATIONS MADE BY THE CONSULTANT AND ANY TEST RESULTS WILL BE SENT TO YOUR REFERRING PHYSICIAN.  EXAM FINDINGS BY THE PHYSICIAN TODAY AND SIGNS OR SYMPTOMS TO REPORT TO CLINIC OR PRIMARY PHYSICIAN:  Exam per T. Jacalyn Lefevre PA  MEDICATIONS PRESCRIBED: Stop coumadin today. Start (tomorrow) xarelto 15 mg twice daily for 21 days then you will take 20 mg daily thereafter.    INSTRUCTIONS GIVEN AND DISCUSSED: CT scans in the next 2 weeks Hyper coag panel (labs) on 4/17 as scheduled  SPECIAL INSTRUCTIONS/FOLLOW-UP: Return in 1 month.   Thank you for choosing [facility] to provide your oncology and hematology care.  To afford each patient quality time with our providers, please arrive at least 15 minutes before your scheduled appointment time.  With your help, our goal is to use those 15 minutes to complete the necessary work-up to ensure our physicians have the information they need to help with your evaluation and healthcare recommendations.    Effective January 1st, 2014, we ask that you re-schedule your appointment with our physicians should you arrive 10 or more minutes late for your appointment.  We strive to give you quality time with our providers, and arriving late affects you and other patients whose appointments are after yours.    Again, thank you for choosing [facility].  Our hope is that these requests will decrease the amount of time that you wait before being seen by our physicians.       _____________________________________________________________  Should you have questions after your visit to [facility], please contact our office at [phone] between the hours of 8:30 a.m. and 5:00 p.m.  Voicemails left after 4:30 p.m. will not be returned until the following business day.  For prescription refill requests, have your pharmacy contact our office with your prescription  refill request.

## 2012-12-02 ENCOUNTER — Other Ambulatory Visit: Payer: Self-pay | Admitting: *Deleted

## 2012-12-02 DIAGNOSIS — Z7901 Long term (current) use of anticoagulants: Secondary | ICD-10-CM | POA: Insufficient documentation

## 2012-12-02 DIAGNOSIS — Z79899 Other long term (current) drug therapy: Secondary | ICD-10-CM

## 2012-12-08 ENCOUNTER — Ambulatory Visit (HOSPITAL_COMMUNITY)
Admission: RE | Admit: 2012-12-08 | Discharge: 2012-12-08 | Disposition: A | Discharge status: Home / Self Care | Payer: BC Managed Care – PPO | Source: Ambulatory Visit | Attending: Oncology | Admitting: Oncology

## 2012-12-08 DIAGNOSIS — E119 Type 2 diabetes mellitus without complications: Secondary | ICD-10-CM | POA: Insufficient documentation

## 2012-12-08 DIAGNOSIS — I82A11 Acute embolism and thrombosis of right axillary vein: Secondary | ICD-10-CM

## 2012-12-08 DIAGNOSIS — M7989 Other specified soft tissue disorders: Secondary | ICD-10-CM | POA: Insufficient documentation

## 2012-12-08 DIAGNOSIS — I82A19 Acute embolism and thrombosis of unspecified axillary vein: Secondary | ICD-10-CM | POA: Insufficient documentation

## 2012-12-08 DIAGNOSIS — I10 Essential (primary) hypertension: Secondary | ICD-10-CM | POA: Insufficient documentation

## 2012-12-13 ENCOUNTER — Other Ambulatory Visit: Payer: Self-pay | Admitting: Family Medicine

## 2012-12-14 ENCOUNTER — Encounter (HOSPITAL_BASED_OUTPATIENT_CLINIC_OR_DEPARTMENT_OTHER): Payer: BC Managed Care – PPO

## 2012-12-14 DIAGNOSIS — I82B19 Acute embolism and thrombosis of unspecified subclavian vein: Secondary | ICD-10-CM

## 2012-12-14 DIAGNOSIS — I82A11 Acute embolism and thrombosis of right axillary vein: Secondary | ICD-10-CM

## 2012-12-14 LAB — ANTITHROMBIN III: AntiThromb III Func: 95 % (ref 75–120)

## 2012-12-14 LAB — COMPREHENSIVE METABOLIC PANEL
ALT: 19 U/L (ref 0–35)
AST: 22 U/L (ref 0–37)
Calcium: 9.8 mg/dL (ref 8.4–10.5)
Creatinine, Ser: 0.78 mg/dL (ref 0.50–1.10)
Sodium: 137 mEq/L (ref 135–145)
Total Protein: 7.5 g/dL (ref 6.0–8.3)

## 2012-12-14 LAB — HOMOCYSTEINE: Homocysteine: 11.9 umol/L (ref 4.0–15.4)

## 2012-12-14 LAB — CBC WITH DIFFERENTIAL/PLATELET
Basophils Relative: 0 % (ref 0–1)
HCT: 39 % (ref 36.0–46.0)
Hemoglobin: 12.9 g/dL (ref 12.0–15.0)
Lymphs Abs: 3.1 10*3/uL (ref 0.7–4.0)
MCH: 24.8 pg — ABNORMAL LOW (ref 26.0–34.0)
MCHC: 33.1 g/dL (ref 30.0–36.0)
Monocytes Absolute: 0.4 10*3/uL (ref 0.1–1.0)
Monocytes Relative: 6 % (ref 3–12)
Neutro Abs: 2.7 10*3/uL (ref 1.7–7.7)
Neutrophils Relative %: 43 % (ref 43–77)
RBC: 5.21 MIL/uL — ABNORMAL HIGH (ref 3.87–5.11)

## 2012-12-14 NOTE — Progress Notes (Signed)
Labs drawn today for cbc/diff,cmp,hypercoag panel

## 2012-12-15 LAB — PROTEIN S ACTIVITY: Protein S Activity: 81 % (ref 69–129)

## 2012-12-15 LAB — LUPUS ANTICOAGULANT PANEL
Lupus Anticoagulant: NOT DETECTED
dRVVT Incubated 1:1 Mix: 41.5 secs (ref <42.9)

## 2012-12-15 LAB — FACTOR 5 LEIDEN

## 2012-12-15 LAB — CARDIOLIPIN ANTIBODIES, IGG, IGM, IGA
Anticardiolipin IgA: 6 APL U/mL — ABNORMAL LOW (ref <22)
Anticardiolipin IgM: 0 MPL U/mL — ABNORMAL LOW (ref <11)

## 2012-12-15 LAB — BETA-2-GLYCOPROTEIN I ABS, IGG/M/A
Beta-2-Glycoprotein I IgA: 0 A Units (ref <20)
Beta-2-Glycoprotein I IgM: 1 M Units (ref <20)

## 2012-12-25 ENCOUNTER — Ambulatory Visit (INDEPENDENT_AMBULATORY_CARE_PROVIDER_SITE_OTHER): Payer: BC Managed Care – PPO | Admitting: Family Medicine

## 2012-12-25 ENCOUNTER — Encounter: Payer: Self-pay | Admitting: Family Medicine

## 2012-12-25 VITALS — BP 138/70 | HR 80 | Ht 64.25 in | Wt 245.2 lb

## 2012-12-25 DIAGNOSIS — I82A11 Acute embolism and thrombosis of right axillary vein: Secondary | ICD-10-CM

## 2012-12-25 DIAGNOSIS — I82A19 Acute embolism and thrombosis of unspecified axillary vein: Secondary | ICD-10-CM

## 2012-12-25 NOTE — Patient Instructions (Signed)
Tylenol 500 mg one or two every 4 hours as needed for pain //no greater than 5 per day  No anti-inflamatories  Questions  1) is coumadin just as good as Xerelto? Is there any distinct advantage to using one or the other?  2) does do lab testing indicate a clotting disorder for which a lifelong blood thinner is necessary?  3) if there is a blood disorder is this something that might children should be tested for?  4) do you feel that the risk of having a severe or life-threatening bleed is equal with both treatments?

## 2012-12-25 NOTE — Progress Notes (Signed)
  Subjective:    Patient ID: [name], female    DOB: 14-Feb-1946, 67 y.o.   MRN: [id number]  HPIpatient comes in today she's very concerned about the new drug she is taking. The specialist placed her on Xarelto she is worried that this stroke could cause fatal bleeding. I tried to do the best I could to educate her about the medication and how it may help in some ways that Coumadin does not. That both drugs have her risk of bleeding but it is true that Coumadin can be reversed with vitamin K where his knee medicine cannot. She also 1 and a note the results of her blood work and her CAT scan. She denies any chest tightness pressure pain shortness of breath. She states that she is trying to watch how she eats although she has noticed that her appetite has been increasing.  Past medical history-blood clots the recent CAT scan and lab work family history reviewed. Negative for any reoccurring blood clots.    Review of Systemsnegative for chest pain shortness of breath fevers. Please see above.     Objective:   Physical Exam Vital signs stable. Lungs are clear. Pulses normal. Heart regular. Extremities no edema.       Assessment & Plan:  Blood clot right arm-she is to continue her current medication until she sees a specialist to discuss the lab results. There is a possibility there could be a clotting disorder and may need some adjustments or potentially lifelong anticoagulation. In addition to this I talked about a CAT scan with her. 25 minutes was spent with patient discussing all these issues. If she does go back on Coumadin we would monitor her her here and keep her INR between 2.5 and 3.0.

## 2013-01-08 ENCOUNTER — Encounter (HOSPITAL_COMMUNITY): Payer: Self-pay | Admitting: Oncology

## 2013-01-08 ENCOUNTER — Encounter (HOSPITAL_COMMUNITY): Payer: BC Managed Care – PPO | Attending: Oncology | Admitting: Oncology

## 2013-01-08 VITALS — BP 143/70 | HR 59 | Temp 97.9°F | Resp 16 | Wt 243.5 lb

## 2013-01-08 DIAGNOSIS — I82A19 Acute embolism and thrombosis of unspecified axillary vein: Secondary | ICD-10-CM

## 2013-01-08 DIAGNOSIS — I82A11 Acute embolism and thrombosis of right axillary vein: Secondary | ICD-10-CM

## 2013-01-08 MED ORDER — RIVAROXABAN 20 MG PO TABS: 20.0000 mg | ORAL_TABLET | Freq: Every day | ORAL | Status: DC

## 2013-01-08 NOTE — Patient Instructions (Addendum)
[facility] Discharge Instructions  RECOMMENDATIONS MADE BY THE CONSULTANT AND ANY TEST RESULTS WILL BE SENT TO YOUR REFERRING PHYSICIAN.  EXAM FINDINGS BY THE PHYSICIAN TODAY AND SIGNS OR SYMPTOMS TO REPORT TO CLINIC OR PRIMARY PHYSICIAN: Discussion by MD.  Bonita Quin can go on with your life, but just be careful with some activities.  MEDICATIONS PRESCRIBED:  Continue the xarelto.  INSTRUCTIONS GIVEN AND DISCUSSED: Report swelling of extremities, sudden onset chest discomfort, etc. (go to ED first)  SPECIAL INSTRUCTIONS/FOLLOW-UP: Follow-up in 2 months to see PA.  Thank you for choosing [facility] to provide your oncology and hematology care.  To afford each patient quality time with our providers, please arrive at least 15 minutes before your scheduled appointment time.  With your help, our goal is to use those 15 minutes to complete the necessary work-up to ensure our physicians have the information they need to help with your evaluation and healthcare recommendations.    Effective January 1st, 2014, we ask that you re-schedule your appointment with our physicians should you arrive 10 or more minutes late for your appointment.  We strive to give you quality time with our providers, and arriving late affects you and other patients whose appointments are after yours.    Again, thank you for choosing [facility].  Our hope is that these requests will decrease the amount of time that you wait before being seen by our physicians.       _____________________________________________________________  Should you have questions after your visit to [facility], please contact our office at [phone] between the hours of 8:30 a.m. and 5:00 p.m.  Voicemails left after 4:30 p.m. will not be returned until the following business day.  For prescription refill requests, have your pharmacy contact our office with your prescription refill request.

## 2013-01-08 NOTE — Progress Notes (Signed)
#  1 DVT of the right axillary vein in the setting of Coumadin dosing, typically therapeutic. She is now on xarelto 20 mg once a day  This very pleasant lady was diagnosed in May of 2013 with right upper extremity DVT, that was unprovoked. She had no recent surgery, or trauma. She had taken a car ride [facility] but states that she had gotten out of the car and walk around a little bit. She has never had a leg DVT.  The laboratory work by her primary care physician that we still of her INRs were essentially therapeutic the vast majority the time.  We did check to see if her clot had resolved and indeed the repeat evaluation showed new clot beyond the area of the old clot. This was therefore very concerning for Coumadin failure. We therefore switched her to xarelto in 2 weeks after stopping Coumadin we did a hypercoagulable panel. The 2 abnormalities revolved around proteins S. and protein C and we will repeat those today to see if they were real.  Am wondering if they were related to the acute clot.  She is very very worried about being on xarelto. She is afraid of bleeding today is essentially. Because of that we had a long discussion about a second opinion with the people at [facility] and she would like [place] to pursue getting her appointment which I think is very reasonable.  I think she is at risk of further clotting potentially but my own recommendation would have been to continue her on xarelto for 12 months and then stop and see if she has recurrence. Only she has recurrence what I recommended lifelong xarelto we'll we will see what the people at [facility] recommend.

## 2013-01-09 LAB — PROTEIN S ACTIVITY: Protein S Activity: 107 % (ref 69–129)

## 2013-01-10 LAB — PROTEIN C, TOTAL: Protein C, Total: 88 % (ref 72–160)

## 2013-01-25 ENCOUNTER — Encounter: Payer: Self-pay | Admitting: Family Medicine

## 2013-01-25 ENCOUNTER — Ambulatory Visit (INDEPENDENT_AMBULATORY_CARE_PROVIDER_SITE_OTHER): Payer: BC Managed Care – PPO | Admitting: Family Medicine

## 2013-01-25 VITALS — BP 132/84 | HR 70 | Ht 64.25 in | Wt 245.1 lb

## 2013-01-25 DIAGNOSIS — R5383 Other fatigue: Secondary | ICD-10-CM

## 2013-01-25 DIAGNOSIS — I82A19 Acute embolism and thrombosis of unspecified axillary vein: Secondary | ICD-10-CM

## 2013-01-25 DIAGNOSIS — E7800 Pure hypercholesterolemia, unspecified: Secondary | ICD-10-CM

## 2013-01-25 DIAGNOSIS — I10 Essential (primary) hypertension: Secondary | ICD-10-CM

## 2013-01-25 DIAGNOSIS — I82A11 Acute embolism and thrombosis of right axillary vein: Secondary | ICD-10-CM

## 2013-01-25 DIAGNOSIS — M2550 Pain in unspecified joint: Secondary | ICD-10-CM

## 2013-01-25 DIAGNOSIS — Z79899 Other long term (current) drug therapy: Secondary | ICD-10-CM

## 2013-01-25 DIAGNOSIS — R5381 Other malaise: Secondary | ICD-10-CM

## 2013-01-25 LAB — LIPID PANEL
HDL: 46 mg/dL (ref >39)
LDL Cholesterol: 130 mg/dL — ABNORMAL HIGH (ref 0–99)

## 2013-01-25 LAB — BASIC METABOLIC PANEL
CO2: 31 mEq/L (ref 19–32)
Calcium: 9.6 mg/dL (ref 8.4–10.5)
Chloride: 101 mEq/L (ref 96–112)
Potassium: 4.1 mEq/L (ref 3.5–5.3)
Sodium: 137 mEq/L (ref 135–145)

## 2013-01-25 LAB — CBC WITH DIFFERENTIAL/PLATELET
Basophils Absolute: 0 10*3/uL (ref 0.0–0.1)
Eosinophils Absolute: 0.2 10*3/uL (ref 0.0–0.7)
Eosinophils Relative: 2 % (ref 0–5)
Lymphocytes Relative: 53 % — ABNORMAL HIGH (ref 12–46)
Lymphs Abs: 3.4 10*3/uL (ref 0.7–4.0)
MCH: 24.3 pg — ABNORMAL LOW (ref 26.0–34.0)
Neutrophils Relative %: 38 % — ABNORMAL LOW (ref 43–77)
Platelets: 325 10*3/uL (ref 150–400)
RBC: 5.3 MIL/uL — ABNORMAL HIGH (ref 3.87–5.11)
RDW: 17.5 % — ABNORMAL HIGH (ref 11.5–15.5)

## 2013-01-25 LAB — TSH: TSH: 1.176 u[IU]/mL (ref 0.350–4.500)

## 2013-01-25 NOTE — Progress Notes (Signed)
  Subjective:    Patient ID: [name], female    DOB: Mar 19, 1946, 67 y.o.   MRN: [id number]  Hypertension This is a chronic problem. The current episode started more than 1 year ago. The problem is unchanged. The problem is controlled. Associated symptoms include malaise/fatigue. Pertinent negatives include no chest pain. (Cough) There are no associated agents to hypertension. There are no known risk factors for coronary artery disease. Treatments tried: HCTZ. The current treatment provides significant improvement. There are no compliance problems.       Review of Systems  Constitutional: Positive for malaise/fatigue. Negative for appetite change.  HENT: Negative for congestion, rhinorrhea and neck stiffness.   Respiratory: Negative for apnea, cough, choking and chest tightness.   Cardiovascular: Negative for chest pain and leg swelling.  Gastrointestinal: Negative for abdominal pain and abdominal distention.  Endocrine: Positive for cold intolerance. Negative for heat intolerance, polydipsia and polyphagia.  Psychiatric/Behavioral: Negative for behavioral problems and agitation.       Objective:   Physical Exam  Vitals reviewed. Constitutional: She appears well-developed and well-nourished.  HENT:  Head: Normocephalic.  Neck: Normal range of motion.  Cardiovascular: Normal rate, regular rhythm and normal heart sounds.   No murmur heard. Pulmonary/Chest: Breath sounds normal. No respiratory distress. She has no wheezes.  Abdominal: Soft.  Musculoskeletal: She exhibits no edema.  r arm upper swelling due to DVT  Lymphadenopathy:    She has no cervical adenopathy.  Skin: She is not diaphoretic.          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

## 2013-01-26 ENCOUNTER — Other Ambulatory Visit: Payer: Self-pay | Admitting: *Deleted

## 2013-01-26 ENCOUNTER — Encounter: Payer: Self-pay | Admitting: Family Medicine

## 2013-01-26 DIAGNOSIS — Z7901 Long term (current) use of anticoagulants: Secondary | ICD-10-CM

## 2013-01-30 ENCOUNTER — Other Ambulatory Visit: Payer: Self-pay | Admitting: Family Medicine

## 2013-01-30 ENCOUNTER — Other Ambulatory Visit (HOSPITAL_COMMUNITY): Payer: Self-pay | Admitting: Oncology

## 2013-01-30 DIAGNOSIS — I82A11 Acute embolism and thrombosis of right axillary vein: Secondary | ICD-10-CM

## 2013-01-30 MED ORDER — RIVAROXABAN 20 MG PO TABS: 20.0000 mg | ORAL_TABLET | Freq: Every day | ORAL | Status: DC

## 2013-02-19 ENCOUNTER — Telehealth: Payer: Self-pay | Admitting: Family Medicine

## 2013-02-19 MED ORDER — POTASSIUM CHLORIDE CRYS ER 10 MEQ PO TBCR: 10.0000 meq | EXTENDED_RELEASE_TABLET | Freq: Every day | ORAL | Status: DC

## 2013-02-19 MED ORDER — HYDROCHLOROTHIAZIDE 25 MG PO TABS: 25.0000 mg | ORAL_TABLET | Freq: Every day | ORAL | Status: DC

## 2013-02-19 NOTE — Telephone Encounter (Signed)
FYI she would like a 3 month RX of each because her insurance is running out.

## 2013-02-19 NOTE — Telephone Encounter (Signed)
Patient would like a refill of her hydrochlorothiazide and potassium to [facility] in [place]

## 2013-03-11 NOTE — Progress Notes (Signed)
Kathleen Punt, MD [address] [place] [address]  DVT of axillary vein, acute right  CURRENT THERAPY: Stopping Xarelto today and observing  INTERVAL HISTORY: [name] 67 y.o. female returns for  regular  visit for followup of DVT of the right axillary vein in the setting of Coumadin anticoagulation, typically therapeutic. She is now on xarelto 20 mg once a day.  "This very pleasant lady was diagnosed in May of 2013 with right upper extremity DVT, that was unprovoked. She had no recent surgery, or trauma. She had taken a car ride [facility] but states that she had gotten out of the car and walk around a little bit. She has never had a leg DVT.  The laboratory work by her primary care physician that we still of her INRs were essentially therapeutic the vast majority the time.  We did check to see if her clot had resolved and indeed the repeat evaluation showed new clot beyond the area of the old clot. This was therefore very concerning for Coumadin failure."  In April 2014, the patient had a hypercoagulable panel showing some abnormalities in her protein c + s tests, but when repeated in May 2014, these tests were WNL and therefore the abnormalities in April were secondary to an acute clot.  Our recommendations were for Xarelto anticoagulation x 12 months, but the patient was concerned about bleeding associated with the medication.  As a result, we set her up for a consultation at [facility].  She was seen by Dr. Isaiah Serge who performed right UE IR venogram which did not demonstrate a DVT.  This conflicts with the 11/28/2012 Doppler study performed at [facility] which showed a development of a new thrombus in the right subclavian vein which appeared to be occlusive. The interventional radiology venogram of course is more specific and thus we appreciate the recommendations from [facility] coagulation clinic, Dr. Isaiah Serge.  The patient continued her  Xarelto and I've informed her to discontinue this medication today. Dr. Mariel Sleet personally spoke to Dr. Isaiah Serge and that was the conclusion of the phone conversation.  We'll continue to follow her for the next year or 2 and then likely release her from the clinic if she does not have a recurrent DVT.  I spent a significant amount time with the patient reviewing the signs and symptoms of DVT and PE. In her discharge instructions, she will be given education regarding both disorders. She is to contact the clinic or her primary care physician if she experiences these signs or symptoms.  The patient was informed to hold her Xarelto and she may bring this to the clinic in the future for proper disposal.  Hematologically, the patient denies any complaints and ROS questioning is negative.    Past Medical History  Diagnosis Date  . Hypertension   . Diverticulitis   . Cyst on liver  . Blood clot of artery under arm   . Diabetes mellitus     controlled by diet  . Menopause   . Fibrocystic breast disease   . Impaired glucose tolerance   . Hepatic cyst   . IBS (irritable bowel syndrome)   . DVT of axillary vein, acute right 11/24/2012    Dx on Jan 28, 2012.      has HYPERCHOLESTEROLEMIA; DEPRESSION; HYPERTENSION; PEPTIC ULCER DISEASE; HIATAL HERNIA; CONSTIPATION; HEPATIC CYST; HEMATEMESIS; OSTEOARTHRITIS; NAUSEA ALONE; ABDOMINAL PAIN, CHRONIC; Chest pain, unspecified; Hypokalemia; Bradycardia; Giddiness; Bilateral ankle fractures; Contusion of  shoulder, right; Ankle fracture; Ankle sprain; Diabetes mellitus type 2, diet-controlled; Acute diverticulitis; and DVT of axillary vein, acute right on her problem list.     is allergic to norvasc; penicillins; and advair diskus.  Ms. Reitter does not currently have medications on file.  Past Surgical History  Procedure Laterality Date  . Back surgery    . Cholecystectomy    . Knee arthrocentesis  left  . Breast surgery  right    cyst removed  .  Abdominal hysterectomy    . Tubal ligation    . Cast application  02/14/2012    Procedure: CAST APPLICATION;  Surgeon: Vickki Hearing, MD;  Location: [facility];  Service: Orthopedics;  Laterality: Right;  procedure room   . Colonoscopy      Denies any headaches, dizziness, double vision, fevers, chills, night sweats, nausea, vomiting, diarrhea, constipation, chest pain, heart palpitations, shortness of breath, blood in stool, black tarry stool, urinary pain, urinary burning, urinary frequency, hematuria.   PHYSICAL EXAMINATION  ECOG PERFORMANCE STATUS: 0 - Asymptomatic  Filed Vitals:   03/12/13 0900  BP: 138/77  Pulse: 62  Temp: 97.9 F (36.6 C)  Resp: 16    GENERAL:alert, no distress, well nourished, well developed, comfortable, cooperative, obese and smiling SKIN: skin color, texture, turgor are normal, no rashes or significant lesions HEAD: Normocephalic, No masses, lesions, tenderness or abnormalities EYES: normal, PERRLA, EOMI, Conjunctiva are pink and non-injected EARS: External ears normal OROPHARYNX:mucous membranes are moist  NECK: supple, no adenopathy, thyroid normal size, non-tender, without nodularity, no stridor, non-tender, trachea midline LYMPH:  no palpable lymphadenopathy BREAST:not examined LUNGS: clear to auscultation and percussion HEART: regular rate & rhythm, no murmurs, no gallops, S1 normal and S2 normal ABDOMEN:abdomen soft, non-tender, obese and normal bowel sounds BACK: Back symmetric, no curvature. EXTREMITIES:less then 2 second capillary refill, no joint deformities, effusion, or inflammation, no skin discoloration, no clubbing, no cyanosis, minimal right UE edema  NEURO: alert & oriented x 3 with fluent speech, no focal motor/sensory deficits, gait normal   LABORATORY DATA: CBC    Component Value Date/Time   WBC 6.4 01/25/2013 1027   RBC 5.30* 01/25/2013 1027   HGB 12.9 01/25/2013 1027   HCT 39.3 01/25/2013 1027   PLT 325 01/25/2013 1027   MCV  74.2* 01/25/2013 1027   MCH 24.3* 01/25/2013 1027   MCHC 32.8 01/25/2013 1027   RDW 17.5* 01/25/2013 1027   LYMPHSABS 3.4 01/25/2013 1027   MONOABS 0.5 01/25/2013 1027   EOSABS 0.2 01/25/2013 1027   BASOSABS 0.0 01/25/2013 1027      Chemistry      Component Value Date/Time   NA 137 01/25/2013 1027   K 4.1 01/25/2013 1027   CL 101 01/25/2013 1027   CO2 31 01/25/2013 1027   BUN 18 01/25/2013 1027   CREATININE 0.83 01/25/2013 1027   CREATININE 0.78 12/14/2012 0936      Component Value Date/Time   CALCIUM 9.6 01/25/2013 1027   ALKPHOS 76 12/14/2012 0936   AST 22 12/14/2012 0936   ALT 19 12/14/2012 0936   BILITOT 0.2* 12/14/2012 0936       ASSESSMENT:  1. Right UE Mild DVT in the axillary vein, mural thrombus, patent. Unprovoked. Diagnosed on 01/27/2013 on [place]. Treated with Coumadin, but subsequently  diagnosed with a right upper extremity new occlusive blood clots on 11/28/2012. She was therefore switched to Xarelto anticoagulations and referred to [facility] coagulation clinic and seen by Dr. Isaiah Serge.  Subsequent interventional radiology right  upper extremity venogram was negative for DVT. As a result, anticoagulation was discontinued and we will follow her.  2. New acute right subclavian DVT that is occlusive diagnosed on doppler US on 11/28/2012 at which time the patient was switched to Xarelto, but subsequently at [facility], venogram of right upper extremity was negative for DVT.  3. Fatty infiltration of liver  4. Obese  5. Irritable bowel syndrome  6. Hypertension  7. Osteoarthritis  8. Cyst on liver  9. Acute diverticulitis   Patient Active Problem List   Diagnosis Date Noted  . DVT of axillary vein, acute right 11/24/2012  . Diabetes mellitus type 2, diet-controlled 11/15/2012  . Acute diverticulitis 11/15/2012  . Ankle fracture 05/17/2012  . Ankle sprain 05/17/2012  . Bilateral ankle fractures 02/12/2012  . Contusion of shoulder, right 02/12/2012  . Chest pain, unspecified 05/10/2011  .  Hypokalemia 05/10/2011  . Bradycardia 05/10/2011  . Giddiness 05/10/2011  . HYPERCHOLESTEROLEMIA 08/29/2008  . DEPRESSION 08/29/2008  . HYPERTENSION 08/29/2008  . PEPTIC ULCER DISEASE 08/29/2008  . HIATAL HERNIA 08/29/2008  . CONSTIPATION 08/29/2008  . HEPATIC CYST 08/29/2008  . HEMATEMESIS 08/29/2008  . OSTEOARTHRITIS 08/29/2008  . NAUSEA ALONE 08/29/2008  . ABDOMINAL PAIN, CHRONIC 08/29/2008     PLAN:  1. I personally reviewed and went over laboratory results with the patient. 2. I personally reviewed and went over radiographic studies with the patient. 3. Chart reviewed 4. D/C Xarelto. 5. Return in 6 months for follow-up   THERAPY PLAN:  The patient will discontinue her her and coagulation as of today, 03/12/2013. We'll see her in 6 months for followup. She was educated on signs and symptoms of DVT and PE. We'll follow her over the next one to 2 years and release her to the clinic if she does not have any recurrent DVTs. She'll follow the primary care physician as scheduled.   All questions were answered. The patient knows to call the clinic with any problems, questions or concerns. We can certainly see the patient much sooner if necessary.  Patient and plan discussed with Dr. Erline Hau and he is in agreement with the aforementioned.   KEFALAS,THOMAS

## 2013-03-12 ENCOUNTER — Encounter (HOSPITAL_COMMUNITY): Payer: BC Managed Care – PPO | Attending: Oncology | Admitting: Oncology

## 2013-03-12 ENCOUNTER — Encounter (HOSPITAL_COMMUNITY): Payer: Self-pay | Admitting: Oncology

## 2013-03-12 VITALS — BP 138/77 | HR 62 | Temp 97.9°F | Resp 16 | Wt 249.0 lb

## 2013-03-12 DIAGNOSIS — I82A19 Acute embolism and thrombosis of unspecified axillary vein: Secondary | ICD-10-CM

## 2013-03-12 DIAGNOSIS — I82A11 Acute embolism and thrombosis of right axillary vein: Secondary | ICD-10-CM

## 2013-03-12 NOTE — Patient Instructions (Addendum)
[facility] Discharge Instructions  RECOMMENDATIONS MADE BY THE CONSULTANT AND ANY TEST RESULTS WILL BE SENT TO YOUR REFERRING PHYSICIAN.  EXAM FINDINGS BY THE PHYSICIAN TODAY AND SIGNS OR SYMPTOMS TO REPORT TO CLINIC OR PRIMARY PHYSICIAN: Exam and discussion by PA.    MEDICATIONS PRESCRIBED:  Stop the xarelto  INSTRUCTIONS GIVEN AND DISCUSSED: Report swelling of extremities or other problems  SPECIAL INSTRUCTIONS/FOLLOW-UP: Follow-up in 6 months.  Thank you for choosing [facility] to provide your oncology and hematology care.  To afford each patient quality time with our providers, please arrive at least 15 minutes before your scheduled appointment time.  With your help, our goal is to use those 15 minutes to complete the necessary work-up to ensure our physicians have the information they need to help with your evaluation and healthcare recommendations.    Effective January 1st, 2014, we ask that you re-schedule your appointment with our physicians should you arrive 10 or more minutes late for your appointment.  We strive to give you quality time with our providers, and arriving late affects you and other patients whose appointments are after yours.    Again, thank you for choosing [facility].  Our hope is that these requests will decrease the amount of time that you wait before being seen by our physicians.       _____________________________________________________________  Should you have questions after your visit to [facility], please contact our office at [phone] between the hours of 8:30 a.m. and 5:00 p.m.  Voicemails left after 4:30 p.m. will not be returned until the following business day.  For prescription refill requests, have your pharmacy contact our office with your prescription refill request.     Venous Thromboembolism, Prevention A venous thromboembolism is a blood clot that forms in a vein. A blood  clot in a deep vein is called a deep venous thrombosis (DVT). A blood clot in the lungs is called a pulmonary embolism (PE). Blood clots are dangerous and can cause death. Blood clots can form in the:  Lungs.  Legs.  Arms. CAUSES  A blood clot can form in a vein from different conditions. A blood clot can develop due to:  Blood flow within a vein that is sluggish or very slow.  Medical conditions that make the blood clot easily.  Vein damage. RISK FACTORS Risk factors can increase your risk of developing a blood clot. Risk factors can include:  Smoking.  Obesity.  Age.  Immobility or sedentary lifestyle.  Sitting or standing for long periods of time.  Chronic or long-term bedrest.  Medical or past history of blood clots.  Family history of blood clots.  Hip, leg, or pelvis injury or trauma.  Major surgery, especially surgery on the hip, knee, or abdomen.  Pregnancy and childbirth.  Birth control pills and hormone replacement therapy.  Medical conditions such as  Peripheral vascular disease (PVD).  Diabetes.  Cancer. SYMPTOMS  Symptoms of VTE can depend on where the clot is located and if the clot breaks off and travels to another organ. Sometimes, there may be no symptoms.   DVT symptoms can include:  Swelling of the leg or arm, especially on one side.  Warmth and redness of the leg or arm, especially on one side.  Pain in an arm or leg. Leg pain may be more noticeable or worse when standing or walking.  PE symptoms can include:  Shortness of breath.  Coughing.  Coughing up blood or blood-tinged mucus (hemoptysis).  Chest pain or chest pain with deep breaths (pleuritic chest pain).  Apprehension, anxiety, or a feeling of impending doom.  Rapid heartbeat. PREVENTION  Exercise regularly. Take a brisk 30 minute walk every day. Staying active and moving around can help prevent blood clots.  Avoid sitting or lying in bed for long periods of  time. Change your position often, especially during a long trip.  Women, especially those over the age of 22, should consider the risks and benefits of taking estrogen medicines. This includes birth control pills and hormone replacement therapy.  Do not smoke, especially if you take estrogen medicines. If you smoke, talk to your caregiver on how to quit.  Eat plenty of fruits and vegetables. Ask your caregiver or dietitian if there are foods you should avoid.  Maintain a weight as suggested by your caregiver.  Wear loose-fitting clothing. Avoid constrictive or tight clothing around your legs or waist.  Try not to bump or injure your legs. Avoid crossing your legs when you are sitting.  Do not use pillows under your knees unless told by your caregiver.  Take all medicines that your caregiver prescribes you.  Wear special stockings (compression stockings or TED hose) if your caregiver prescribes them.  Wearing compression stockings (support hose) can make the leg veins more narrow. This increases blood flow in the legs and can help prevent blood clots.  It is important to wear compression stockings correctly. Do not let them bunch up when you are wearing them. TRAVEL Long distance travel can increase the risk of a blood clot. To prevent a blood clot when traveling:  You should exercise your legs by walking or by pumping your muscles every hour. To help prevent poor circulation on long trips, stand, stretch, and walk up and down the aisle of your airplane, train, or bus as often as possible to get the blood moving.  Do squats if you are able. If you are unable to do squats, raise your foot on the balls of your feet and tighten your lower leg muscles (particularly the calve muscles) while seated. Pointing (flexing and extending) your toes while tightening your calves while seated are also good exercises to do every hour during long trips. They help increase blood flow and reduce risk of  DVT.  Stay well hydrated. Drink water regularly when traveling, especially when you are sitting or immobile for long periods of time.  Use of drugs to prevent DVT during routine travel is not generally recommended. Before taking any drugs to reduce risk of DVT, consult your caregiver. SURGERY AND HOSPITALIZATION  People who are at high risk for a blood clot may be given a blood thinning medicine (anticoagulant) when they are hospitalized even if they are not going to have surgery.  A long trip prior to surgery can increase the risk of a clot for patients undergoing hip and knee replacements. Talk to your caregiver about travel plans before your surgery.  After hip or knee surgery, your caregiver may give you anticoagulants to help prevent blood clots.  Anticoagulants may be given to people at high risk of developing thromboembolism, before, during, or sometimes after surgery, including people with clotting disorders or with a history of past thromboembolism. TRAVEL AFTER SURGERY  In orthopedic surgery, the cutting of bones prompts the body to increase clotting factors in the blood. Due to the size of the bones involved in hip and knee replacements, there is a higher risk  of blood clotting than other orthopedic surgeries.  There is a risk of clotting for up to 4 6 weeks after surgery. Flying or traveling long distances can increase your risk of a clot. As a result, those who travel long distances may need additional preventive measures after their procedure.  Drink only non-alcoholic beverages during your flight, train, or car travel. Alcohol can dehydrate you and increase your risk of getting blood clots. SEEK IMMEDIATE MEDICAL CARE IF:   You develop chest pain.  You develop severe shortness of breath.  You have breathing problems after traveling.  You develop swelling or pain in the leg.  You begin to cough up bloody mucus or phlegm (sputum).  You feel dizzy or faint. Document  Released: 08/04/2009 Document Revised: 05/10/2012 Document Reviewed: 08/04/2009 [facility] Patient Information 2014 [place], [place]. Deep Vein Thrombosis A deep vein thrombosis (DVT) is a blood clot that develops in a deep vein. A DVT is a clot in the deep, larger veins of the leg, arm, or pelvis. These are more dangerous than clots that might form in veins near the surface of the body. A DVT can lead to complications if the clot breaks off and travels in the bloodstream to the lungs.  A DVT can damage the valves in your leg veins, so that instead of flowing upwards, the blood pools in the lower leg. This is called post-thrombotic syndrome, and can result in pain, swelling, discoloration, and sores on the leg. Once identified, a DVT can be treated. It can also be prevented in some circumstances. Once you have had a DVT, you may be at increased risk for a DVT in the future. CAUSES Blood clots form in a vein for different reasons. Usually several things contribute to blood clots. Contributing factors include:  The flow of blood slows down.  The inside of the vein is damaged in some way.  The person has a condition that makes blood clot more easily. Some people are more likely than others to develop blood clots. That is because they have more factors that make clots likely. These are called risk factors. Risk factors include:   Older age, especially over 2 years old.  Having a history of blood clots. This means you have had one before. Or, it means that someone else in your family has had blood clots. You may have a genetic tendency to form clots.  Having major or lengthy surgery. This is especially true for surgery on the hip, knee, or belly (abdomen). Hip surgery is particularly high risk.  Breaking a hip or leg.  Sitting or lying still for a long time. This includes long distance travel, paralysis, or recovery from an illness or surgery.  Cancer, or cancer treatment.  Having a long, thin  tube (catheter) placed inside a vein during a medical procedure.  Being overweight (obese).  Pregnancy and childbirth. Hormone changes make the blood clot more easily during pregnancy. The fetus puts pressure on the veins of the pelvis. There is also risk of injury to veins during delivery or a caesarean. The risk is at its highest just after childbirth.  Medicines with the female hormone estrogen. This includes birth control pills and hormone replacement therapy.  Smoking.  Other circulation or heart problems. SYMPTOMS When a clot forms, it can either partially or totally block the blood flow in that vein. Symptoms of a DVT can include:  Swelling of the leg or arm, especially if one side is much worse.  Warmth and redness  of the leg or arm, especially if one side is much worse.  Pain in an arm or leg. If the clot is in the leg, symptoms may be more noticeable or worse when standing or walking. The symptoms of a DVT that has traveled to the lungs (pulmonary embolism, PE) usually start suddenly, and include:  Shortness of breath.  Coughing.  Coughing up blood or blood-tinged phlegm.  Chest pain. The chest pain is often worse with deep breaths.  Rapid heartbeat. Anyone with these symptoms should get emergency medical treatment right away. Call your local emergency services (911 in [place] if you have these symptoms. DIAGNOSIS If a DVT is suspected, your caregiver will take a full medical history and carry out a physical exam. Tests that also may be required include:  Blood tests, including studies of the clotting properties of the blood.  Ultrasonography to see if you have clots in your legs or lungs.  X-rays to show the flow of blood when dye is injected into the veins (venography).  Studies of your lungs, if you have any chest symptoms. PREVENTION  Exercise the legs regularly. Take a brisk 30 minute walk every day.  Maintain a weight that is appropriate for your  height.  Avoid sitting or lying in bed for long periods of time without moving your legs.  Women, particularly those over the age of 62, should consider the risks and benefits of taking estrogen medicines, including birth control pills.  Do not smoke, especially if you take estrogen medicines.  Long distance travel can increase your risk of DVT. You should exercise your legs by walking or pumping the muscles every hour.  In-hospital prevention:  Many of the risk factors above relate to situations that exist with hospitalization, either for illness, injury, or elective surgery.  Your caregiver will assess you for the need for venous thromboembolism prophylaxis when you are admitted to the hospital. If you are having surgery, your surgeon will assess you the day of or day after surgery.  Prevention may include medical and nonmedical measures. TREATMENT Treatment for DVT helps prevent death and disability. The most common treatment for DVT is blood thinning (anticoagulant) medicine, which reduces the blood's tendency to clot. Anticoagulants can stop new blood clots from forming and old ones from growing. They cannot dissolve existing clots. Your body does this by itself over time. Anticoagulants can be given by mouth, by intravenous (IV) access, or by injection. Your caregiver will determine the best program for you.  Heparin or related medicines (low molecular weight heparin) are usually the first treatment for a blood clot. They act quickly. However, they cannot be taken orally.  Heparin can cause a fall in a component of blood that stops bleeding and forms blood clots (platelets). You will be monitored with blood tests to be sure this does not occur.  Warfarin is an anticoagulant that can be swallowed (taken orally). It takes a few days to start working, so usually heparin or related medicines are used in combination. Once warfarin is working, heparin is usually stopped.  Less commonly,  clot dissolving drugs (thrombolytics) are used to dissolve a DVT. They carry a high risk of bleeding, so they are used mainly in severe cases, where a life or limb is threatened.  Very rarely, a blood clot in the leg needs to be removed surgically.  If you are unable to take anticoagulants, your caregiver may arrange for you to have a filter placed in a main vein in your  belly (abdomen). This filter prevents clots from traveling to your lungs. HOME CARE INSTRUCTIONS  Take all medicines prescribed by your caregiver. Follow the directions carefully.  Warfarin. Most people will continue taking warfarin after hospital discharge. Your caregiver will advise you on the length of treatment (usually 3 6 months, sometimes lifelong).  Too much and too little warfarin are both dangerous. Too much warfarin increases the risk of bleeding. Too little warfarin continues to allow the risk for blood clots. While taking warfarin, you will need to have regular blood tests to measure your blood clotting time. These blood tests usually include both the prothrombin time (PT) and international normalized ratio (INR) tests. The PT and INR results allow your caregiver to adjust your dose of warfarin. The dose can change for many reasons. It is critically important that you take warfarin exactly as prescribed, and that you have your PT and INR levels drawn exactly as directed.  Many foods, especially foods high in vitamin K can interfere with warfarin and affect the PT and INR results. Foods high in vitamin K include spinach, kale, broccoli, cabbage, collard and turnip greens, brussels sprouts, peas, cauliflower, seaweed, and parsley as well as beef and pork liver, green tea, and soybean oil. You should eat a consistent amount of foods high in vitamin K. Avoid major changes in your diet, or notify your caregiver before changing your diet. Arrange a visit with a dietitian to answer your questions.  Many medicines can interfere  with warfarin and affect the PT and INR results. You must tell your caregiver about any and all medicines you take, this includes all vitamins and supplements. Be especially cautious with aspirin and anti-inflammatory medicines. Ask your caregiver before taking these. Do not take or discontinue any prescribed or over-the-counter medicine except on the advice of your caregiver or pharmacist.  Warfarin can have side effects, primarily excessive bruising or bleeding. You will need to hold pressure over cuts for longer than usual. Your caregiver or pharmacist will discuss other potential side effects.  Alcohol can change the body's ability to handle warfarin. It is best to avoid alcoholic drinks or consume only very small amounts while taking warfarin. Notify your caregiver if you change your alcohol intake.  Notify your dentist or other caregivers before procedures.  Activity. Ask your caregiver how soon you can go back to normal activities. It is important to stay active to prevent blood clots. If you are on anticoagulant medicine, avoid contact sports.  Exercise. It is very important to exercise. This is especially important while traveling, sitting or standing for long periods of time. Exercise your legs by walking or by pumping the muscles frequently. Take frequent walks.  Compression stockings. These are tight elastic stockings that apply pressure to the lower legs. This pressure can help keep the blood in the legs from clotting. You may need to wear compressions stockings at home to help prevent a DVT.  Smoking. If you smoke, quit. Ask your caregiver for help with quitting smoking.  Learn as much as you can about DVT. Knowing more about the condition should help you keep it from coming back.  Wear a medical alert bracelet or carry a medical alert card. SEEK MEDICAL CARE IF:  You notice a rapid heartbeat.  You feel weaker or more tired than usual.  You feel faint.  You notice increased  bruising.  You feel your symptoms are not getting better in the time expected.  You believe you are having side effects  of medicine. SEEK IMMEDIATE MEDICAL CARE IF:  You have chest pain.  You have trouble breathing.  You have new or increased swelling or pain in one leg.  You cough up blood.  You notice blood in vomit, in a bowel movement, or in urine. MAKE SURE YOU:  Understand these instructions.  Will watch your condition.  Will get help right away if you are not doing well or get worse. Document Released: 08/16/2005 Document Revised: 05/10/2012 Document Reviewed: 10/08/2010 [facility] Patient Information 2014 [place], [place].

## 2013-03-13 ENCOUNTER — Encounter: Payer: Self-pay | Admitting: Family Medicine

## 2013-04-26 ENCOUNTER — Ambulatory Visit (INDEPENDENT_AMBULATORY_CARE_PROVIDER_SITE_OTHER): Payer: No Typology Code available for payment source | Admitting: Family Medicine

## 2013-04-26 ENCOUNTER — Encounter: Payer: Self-pay | Admitting: Family Medicine

## 2013-04-26 VITALS — BP 148/82 | Ht 65.0 in | Wt 249.8 lb

## 2013-04-26 DIAGNOSIS — K921 Melena: Secondary | ICD-10-CM

## 2013-04-26 DIAGNOSIS — I10 Essential (primary) hypertension: Secondary | ICD-10-CM

## 2013-04-26 DIAGNOSIS — K5900 Constipation, unspecified: Secondary | ICD-10-CM

## 2013-04-26 DIAGNOSIS — I82A19 Acute embolism and thrombosis of unspecified axillary vein: Secondary | ICD-10-CM

## 2013-04-26 DIAGNOSIS — I82A11 Acute embolism and thrombosis of right axillary vein: Secondary | ICD-10-CM

## 2013-04-26 DIAGNOSIS — E7800 Pure hypercholesterolemia, unspecified: Secondary | ICD-10-CM

## 2013-04-26 MED ORDER — DICYCLOMINE HCL 20 MG PO TABS: 20.0000 mg | ORAL_TABLET | Freq: Three times a day (TID) | ORAL | Status: DC | PRN

## 2013-04-26 NOTE — Progress Notes (Signed)
  Subjective:    Patient ID: [name], female    DOB: [birth year], 67 y.o.   MRN: [id number]  HPI Here today for check up.  Patient does relate intermittent epigastric pain discomfort but it's not occurred lately she states she is using her on omeprazole and it's doing very well. Needs a refill on medication.  She does relate constipation she uses some Benefiber in a very small amount of MiraLax. She states her bowel movements when she was constipated had a small streak of blood in it but she has not seen any other blood she had a colonoscopy 2 years ago. Recently she saw specialist at [facility] regarding blood clots they told her they did not find any evidence of blood clots and they told her not to be on Xarelto. They drew some blood and they told her that results would be sent for 2 either the hematologist or [place].   Wants to see what type of exercise machines she can use at the [facility]. She relates she does walk her therapy she wonders if she can use weight machines ellipticals or treadmill  Has headaches and dizziness intermittently, may be related to high BPs. She said her BP was high on Tuesday, reading 184/80. She states when she saw the specialist her blood pressure was moderately elevated. She does try to watch her diet she is trying to lose weight  Past medical history obesity history of blood clots hypertension She does not smoke    Review of Systems Denies chest pain shortness of breath nausea vomiting denies swelling in the legs she does have chronic swelling in the right arm    Objective:   Physical Exam Lungs are clear hearts regular pulse normal blood pressure on 2 checks with a large cuff in the left and right arm came in at 130/76 Extremities she does have some edema in the right upper arm but no tenderness and no swelling in her legs       Assessment & Plan:  HTN-currently under good control. The more that she does aerobic activity do better her blood pressure will  be No sign of blood clots currently no need for anticoagulants. Await the results from [facility] Constipation increase MiraLax daily Hemoccult cards x3 no need for colonoscopy currently Stroke and heart attack prevention given her history of cholesterol age diabetes and hypertension I recommend baby aspirin 81 mg every day also recommend that she continue on omeprazole as a protectant for her heart. 25 minutes spent in patient [phone]

## 2013-04-26 NOTE — Patient Instructions (Signed)
1 capful in 8 oz once or twice daily ( miralax)

## 2013-05-04 ENCOUNTER — Telehealth: Payer: Self-pay | Admitting: Family Medicine

## 2013-05-04 NOTE — Telephone Encounter (Signed)
Spoke with patient for clarification.  Pt states that her insurance company contacted her and stated that Dicyclomine(Bentyl) is too expensive and that we need to either try to get authorization or change pt's med to something similar.  Pt states that she's tried other meds and this is the only one that works.  I will contact insurance company to try to get list of alternatives or wll fax in prior auth request.  May need dictated letter from doctor, will forward to Dr. Lorin Picket if that's needed. Ph# [phone], Fx# [phone] = Tedd Sias

## 2013-05-04 NOTE — Telephone Encounter (Signed)
Patient says that we need to send in a prior auth for her medicine dicyclomine because her insurance wont pay for it. I explained to her that the pharmacy would have to send [place] a denial and we would then work on and it and she told me that she spoke with the lady at the pharmacy and Medicare and they told her that we would have to call Medicare to do this prior auth.    Medicare [phone]

## 2013-05-15 ENCOUNTER — Telehealth: Payer: Self-pay | Admitting: Family Medicine

## 2013-05-15 NOTE — Telephone Encounter (Signed)
error 

## 2013-05-15 NOTE — Telephone Encounter (Addendum)
Obtained authorization from [place] for pt's Bentyl (dicyclomine).  Auth expires 08/29/13.  Tried to call pt to notify, had to [facility] with a gentleman that answered. Called [phone] and spoke with Hezzie Bump

## 2013-05-15 NOTE — Telephone Encounter (Signed)
Patient called back, explained that med is approved until 08/29/13.

## 2013-06-27 ENCOUNTER — Encounter: Payer: Self-pay | Admitting: Family Medicine

## 2013-06-27 ENCOUNTER — Ambulatory Visit (INDEPENDENT_AMBULATORY_CARE_PROVIDER_SITE_OTHER): Payer: No Typology Code available for payment source | Admitting: Family Medicine

## 2013-06-27 ENCOUNTER — Telehealth (HOSPITAL_COMMUNITY): Payer: Self-pay | Admitting: Dietician

## 2013-06-27 VITALS — BP 130/82 | Ht 65.5 in | Wt 251.1 lb

## 2013-06-27 DIAGNOSIS — E1165 Type 2 diabetes mellitus with hyperglycemia: Secondary | ICD-10-CM

## 2013-06-27 DIAGNOSIS — R7301 Impaired fasting glucose: Secondary | ICD-10-CM

## 2013-06-27 DIAGNOSIS — K5900 Constipation, unspecified: Secondary | ICD-10-CM

## 2013-06-27 DIAGNOSIS — I10 Essential (primary) hypertension: Secondary | ICD-10-CM

## 2013-06-27 DIAGNOSIS — Z23 Encounter for immunization: Secondary | ICD-10-CM

## 2013-06-27 LAB — POCT GLYCOSYLATED HEMOGLOBIN (HGB A1C): Hemoglobin A1C: 7

## 2013-06-27 MED ORDER — DICYCLOMINE HCL 10 MG PO CAPS: 10.0000 mg | ORAL_CAPSULE | Freq: Three times a day (TID) | ORAL | Status: DC | PRN

## 2013-06-27 MED ORDER — METFORMIN HCL 500 MG PO TABS: 500.0000 mg | ORAL_TABLET | Freq: Two times a day (BID) | ORAL | Status: DC

## 2013-06-27 MED ORDER — POLYETHYLENE GLYCOL 3350 17 GM/SCOOP PO POWD: 17.0000 g | Freq: Two times a day (BID) | ORAL | Status: DC | PRN

## 2013-06-27 NOTE — Progress Notes (Signed)
  Subjective:    Patient ID: [name], female    DOB: 1945/09/30, 67 y.o.   MRN: [id number]  Hypertension This is a chronic problem. The current episode started more than 1 year ago. The problem has been gradually improving since onset. The problem is controlled. There are no associated agents to hypertension. There are no known risk factors for coronary artery disease. Treatments tried: HCTZ. The current treatment provides significant improvement. There are no compliance problems.   Constipation This is a new problem. The current episode started 1 to 4 weeks ago. The problem has been gradually worsening since onset. Her stool frequency is 1 time per day. The stool is described as firm. The patient is on a high fiber diet. There has been adequate water intake. Associated symptoms include abdominal pain. She has tried laxatives and diet changes for the symptoms. The treatment provided no relief.   patient denies excessive thirst or urination.  Results of recent blood work reviewed with her I told her that her glucose slightly up earlier this year that we should check hemoglobin A1c. She does have some family history diabetes. Does not smoke.    Review of Systems  Gastrointestinal: Positive for abdominal pain and constipation.       Objective:   Physical Exam    Foot exam normal no edema lungs are clear no crackles respiratory rate normal heart is regular pulse normal blood pressure good abdomen soft obese    Assessment & Plan:  #1 constipation that she could be do to Bentyl I would recommend trying 10 mg 3 times a day as needed. In addition to this Glycolax was prescribed. Patient is up-to-date on her colonoscopy. Increased water intake as well. She will let us know if not doing well with this regimen over the next month if so then may try Amitiza  #2 HTN good control continue current measures  #3 new onset diabetes education given referral to dietitian metformin 500 mg twice  a day check sugars periodically sinus readings. Followup in 3 months recheck hemoglobin A1c 30 minutes spent with patient

## 2013-06-27 NOTE — Patient Instructions (Signed)

## 2013-06-27 NOTE — Telephone Encounter (Signed)
Received voicemail from pt left at [phone]. Pt referred by Dr. Gerda Diss to register for diabetes class.

## 2013-06-27 NOTE — Telephone Encounter (Signed)
Called back at 1538. Pt registered to attend diabetes class on 07/03/13 at 1000.

## 2013-06-28 ENCOUNTER — Telehealth: Payer: Self-pay | Admitting: Family Medicine

## 2013-06-28 NOTE — Telephone Encounter (Signed)
Patient was seen by Dr. Lorin Picket on 06/27/2013.  States the instructions on the metFORMIN (GLUCOPHAGE) 500 MG tablet states to take 1 tablet twice a day with a meal.  However, she thought Dr. Lorin Picket informed her to take 1/2 tablet twice with meals.  States her glucose is 106 this AM.  States she is going to STOP eating her breakfast and wait for our phone call.  Please call patient to let her know what dosage is correct.

## 2013-06-28 NOTE — Telephone Encounter (Signed)
Per Dr. Lorin Picket- take half a tablet twice a day for up to a week till get use to med and then go to one tablet twice a day. Patient notified.

## 2013-07-03 ENCOUNTER — Encounter (HOSPITAL_COMMUNITY): Payer: Self-pay | Admitting: Dietician

## 2013-07-03 NOTE — Progress Notes (Signed)
[facility] Diabetes Class Completion  Date:July 03, 2013  Time: 1000  Pt attended [facility] Education Class on July 03, 2013.   Patient was educated on the following topics:   -Survival skills (signs and symptoms of hyperglycemia and hypoglycemia, treatment for hypoglycemia, ideal levels for fasting and postprandial blood sugars, goal Hgb A1c level, foot care basics)  -Recommendations for physical activity   -Carbohydrate metabolism in relation to diabetes   -Meal planning (sources of carbohydrate, carbohydrate counting, meal planning strategies, food label reading, and portion control).  Handouts provided:  -"Diabetes and You: Taking Charge of Your Health"  -"Carbohydrate Counting and Meal Planning"  -"Your Guide to Better Office Visits"   Libby Goehring A. Emrie Gayle, RD, LDN  

## 2013-07-09 ENCOUNTER — Ambulatory Visit (INDEPENDENT_AMBULATORY_CARE_PROVIDER_SITE_OTHER): Payer: No Typology Code available for payment source | Admitting: Family Medicine

## 2013-07-09 ENCOUNTER — Encounter: Payer: Self-pay | Admitting: Family Medicine

## 2013-07-09 VITALS — BP 128/80 | Temp 97.9°F | Ht 65.0 in

## 2013-07-09 DIAGNOSIS — R109 Unspecified abdominal pain: Secondary | ICD-10-CM

## 2013-07-09 DIAGNOSIS — R252 Cramp and spasm: Secondary | ICD-10-CM

## 2013-07-09 NOTE — Progress Notes (Signed)
  Subjective:    Patient ID: [name], female    DOB: 1946-07-07, 67 y.o.   MRN: [id number]  HPIHaving left sided abdominal pain, cramps in legs, dizziness, headache and weakness. Started last Thursday. Blood sugar was low last week.  Cramps in legs last Thursday in legs and upper back Nauseated, weak, then diarrhea (no blood) Eat for lunch Complained of Ha on Friday Tremors on Friday, had low glucose readings over the weekend,  Recently started on Metformin 1/2 tablet Also with sweats Ever since starting metformin she has had problems with this. Abdominal cramps gas discomfort  Review of Systems  Constitutional: Positive for fatigue. Negative for fever.  HENT: Negative for sneezing and sore throat.   Respiratory: Negative for cough, choking and chest tightness.   Cardiovascular: Negative for chest pain.  Gastrointestinal: Negative for abdominal pain and abdominal distention.       Objective:   Physical Exam  Vitals reviewed. Constitutional: She appears well-developed and well-nourished.  HENT:  Head: Normocephalic.  Mouth/Throat: No oropharyngeal exudate.  Cardiovascular: Normal rate, regular rhythm and normal heart sounds.   No murmur heard. Pulmonary/Chest: Effort normal and breath sounds normal. No respiratory distress. She has no wheezes.  Abdominal: Soft. She exhibits no distension and no mass. There is no tenderness.  Musculoskeletal: She exhibits no edema.  Lymphadenopathy:    She has no cervical adenopathy.          Assessment & Plan:  Probable intolerance to metformin I recommend stopping metformin watch diet closely exercise should get better with all of this check A1c again in 3 months time along with the other standard lab work metabolic 7, lipid, liver, urine micro-protein

## 2013-07-18 ENCOUNTER — Telehealth: Payer: Self-pay | Admitting: Family Medicine

## 2013-07-18 NOTE — Telephone Encounter (Signed)
[facility] concerned about dicyclomine use in seniors. I am aware of this. In my opinion benefit outweighs risk. We will see how she is doing with this when she follows up. She was instructed to use this on a when necessary basis no more than 3 times a day.

## 2013-07-18 NOTE — Telephone Encounter (Signed)
[facility] for medication.

## 2013-08-31 ENCOUNTER — Ambulatory Visit (INDEPENDENT_AMBULATORY_CARE_PROVIDER_SITE_OTHER): Payer: Medicare HMO | Admitting: Family Medicine

## 2013-08-31 ENCOUNTER — Encounter: Payer: Self-pay | Admitting: Family Medicine

## 2013-08-31 VITALS — BP 148/82 | Ht 65.0 in

## 2013-08-31 DIAGNOSIS — S46811A Strain of other muscles, fascia and tendons at shoulder and upper arm level, right arm, initial encounter: Secondary | ICD-10-CM

## 2013-08-31 DIAGNOSIS — S46819A Strain of other muscles, fascia and tendons at shoulder and upper arm level, unspecified arm, initial encounter: Secondary | ICD-10-CM

## 2013-08-31 DIAGNOSIS — S43499A Other sprain of unspecified shoulder joint, initial encounter: Secondary | ICD-10-CM

## 2013-08-31 MED ORDER — NAPROXEN 500 MG PO TABS: 500.0000 mg | ORAL_TABLET | Freq: Two times a day (BID) | ORAL | Status: DC

## 2013-08-31 MED ORDER — CHLORZOXAZONE 500 MG PO TABS: 500.0000 mg | ORAL_TABLET | Freq: Three times a day (TID) | ORAL | Status: DC | PRN

## 2013-08-31 NOTE — Progress Notes (Signed)
   Subjective:    Patient ID: [name], female    DOB: Oct 18, 1945, 68 y.o.   MRN: [id number]  HPI  Patient arrives with complaint of back pain for a week.  Patient don't recall a specific injury but has been doing a lot of lifting lately. Pain right side shoulder blade area Worse with laying down Doing some stretching and exercises Trying tylenol-little help PMH benign Review of Systems Denies shortness breath chest pressure tightness pain mainly pain in the back scapular region does not radiate down the arm    Objective:   Physical Exam Tenderness on the right scapula region lungs clear hearts regular low back nontender good range of motion of the right shoulder no sign of any type of rotator cuff injury       Assessment & Plan:  Scapula pain discomfort should gradually get better muscle relaxers anti-inflammatory use followup if progressive troubles

## 2013-09-10 ENCOUNTER — Other Ambulatory Visit: Payer: Self-pay | Admitting: *Deleted

## 2013-09-11 NOTE — Progress Notes (Signed)
[place] [id number]  DVT of axillary vein, acute right - Plan: D-dimer, quantitative, D-dimer, quantitative, CANCELED: D-dimer, quantitative  CURRENT THERAPY: Observation.  Finished 12 months of anticoagulation on 03/12/2013.  INTERVAL HISTORY: [name] 68 y.o. female returns for  regular  visit for followup of DVT of the right axillary vein in the setting of Coumadin anticoagulation, typically therapeutic. She is now on xarelto 20 mg once a day.  "This very pleasant lady was diagnosed in May of 2013 with right upper extremity DVT, that was unprovoked. She had no recent surgery, or trauma. She had taken a car ride [facility] but states that she had gotten out of the car and walk around a little bit. She has never had a leg DVT.  The laboratory work by her primary care physician that we still of her INRs were essentially therapeutic the vast majority the time.   We did check to see if her clot had resolved and indeed the repeat evaluation showed new clot beyond the area of the old clot. This was therefore very concerning for Coumadin failure."  In April 2014, the patient had a hypercoagulable panel showing some abnormalities in her protein c + s tests, but when repeated in May 2014, these tests were WNL and therefore the abnormalities in April were secondary to an acute clot.  Our recommendations were for Xarelto anticoagulation x 12 months, but the patient was concerned about bleeding associated with the medication. As a result, we set her up for a consultation at [facility]. She was seen by Dr. Joan Flores who performed right UE IR venogram which did not demonstrate a DVT. This conflicts with the 55/73/2202 Doppler study performed at [facility] which showed a development of a new thrombus in the right subclavian vein which appeared to be occlusive. The venogram of course is more specific and thus we appreciate the  recommendations from [facility] coagulation clinic, Dr. Joan Flores.  The patient continued her Xarelto and completed 12 months worth of anticoagulation.  She completed this on 03/12/2013.  She reports some right UE edema, which is minimal-mild on exam.  She also notes some discomfort which she describes as aching.  I provided patient education regarding post-thrombotic edema in addition to post-thrombotic discomfort.  I drew an illustration of a vein and artery and I compared and contrasted these two vessels within the body.  I provided her education regarding veinous valves and their role.  We discussed the likely damage of these valves from her clot burden which is the cause of her edema.  I have offered her a referral to the lymphedema clinic and she has denied at this time.   She asks about water aerobics which I strongly recommend for this obese person.  Water aerobics are easier on the joints and provide significant resistance with exercising.  I do not see a contra-indication for this.   Hematologically, she denies any complaints and ROS questioning is negative.  No signs of symptoms of VTE including pain, heat, redness, edema, SOB, and chest pain.    Past Medical History  Diagnosis Date  . Hypertension   . Diverticulitis   . Cyst on liver  . Blood clot of artery under arm   . Diabetes mellitus     controlled by diet  . Menopause   . Fibrocystic breast disease   . Impaired glucose tolerance   . Hepatic cyst   .  IBS (irritable bowel syndrome)   . DVT of axillary vein, acute right 11/24/2012    Dx on Jan 28, 2012.      has HYPERCHOLESTEROLEMIA; DEPRESSION; HYPERTENSION; PEPTIC ULCER DISEASE; HIATAL HERNIA; CONSTIPATION; HEPATIC CYST; HEMATEMESIS; OSTEOARTHRITIS; NAUSEA ALONE; ABDOMINAL PAIN, CHRONIC; Chest pain, unspecified; Hypokalemia; Bradycardia; Giddiness; Bilateral ankle fractures; Contusion of shoulder, right; Ankle fracture; Ankle sprain; Diabetes mellitus type 2,  diet-controlled; Acute diverticulitis; DVT of axillary vein, acute right; and Diabetes type 2, uncontrolled on her problem list.     is allergic to norvasc; penicillins; and advair diskus.  [name] had no medications administered during this visit.  Past Surgical History  Procedure Laterality Date  . Back surgery    . Cholecystectomy    . Knee arthrocentesis  left  . Breast surgery  right    cyst removed  . Abdominal hysterectomy    . Tubal ligation    . Cast application  0/04/3817    Procedure: CAST APPLICATION;  Surgeon: Carole Civil, MD;  Location: [facility];  Service: Orthopedics;  Laterality: Right;  procedure room   . Colonoscopy      Denies any headaches, dizziness, double vision, fevers, chills, night sweats, nausea, vomiting, diarrhea, constipation, chest pain, heart palpitations, shortness of breath, blood in stool, black tarry stool, urinary pain, urinary burning, urinary frequency, hematuria.   PHYSICAL EXAMINATION  ECOG PERFORMANCE STATUS: 0 - Asymptomatic  Filed Vitals:   09/12/13 1000  BP: 136/49  Pulse: 62  Temp: 97.8 F (36.6 C)  Resp: 16    GENERAL:alert, no distress, well nourished, well developed, comfortable, cooperative, obese and smiling SKIN: skin color, texture, turgor are normal, no rashes or significant lesions HEAD: Normocephalic, No masses, lesions, tenderness or abnormalities EYES: normal, PERRLA, EOMI, Conjunctiva are pink and non-injected EARS: External ears normal OROPHARYNX:mucous membranes are moist  NECK: supple, trachea midline LYMPH:  not examined BREAST:not examined LUNGS: clear to auscultation  HEART: regular rate & rhythm, no murmurs and no gallops ABDOMEN:non-tender and obese BACK: Back symmetric, no curvature. EXTREMITIES:less then 2 second capillary refill, no joint deformities, effusion, or inflammation, no skin discoloration, no clubbing, no cyanosis, positive findings:  Minimal right UE edema  NEURO: alert &  oriented x 3 with fluent speech, no focal motor/sensory deficits, gait normal    LABORATORY DATA: CBC    Component Value Date/Time   WBC 6.4 01/25/2013 1027   RBC 5.30* 01/25/2013 1027   HGB 12.9 01/25/2013 1027   HCT 39.3 01/25/2013 1027   PLT 325 01/25/2013 1027   MCV 74.2* 01/25/2013 1027   MCH 24.3* 01/25/2013 1027   MCHC 32.8 01/25/2013 1027   RDW 17.5* 01/25/2013 1027   LYMPHSABS 3.4 01/25/2013 1027   MONOABS 0.5 01/25/2013 1027   EOSABS 0.2 01/25/2013 1027   BASOSABS 0.0 01/25/2013 1027     ASSESSMENT:  1. Right UE Mild DVT in the axillary vein, mural thrombus, patent. Unprovoked. Diagnosed on 01/27/2013 on [place]. Treated with Coumadin, but subsequently diagnosed with a right upper extremity new occlusive blood clots on 11/28/2012. She was therefore switched to Xarelto anticoagulations and referred to [facility] coagulation clinic and seen by Dr. Joan Flores. Subsequent interventional radiology right upper extremity venogram was negative for DVT. As a result, anticoagulation was discontinued on 03/12/2013 and we will follow her.  2. New acute right subclavian DVT that is occlusive diagnosed on doppler US on 11/28/2012 at which time the patient was switched to Xarelto, but subsequently at [facility], venogram of right upper  extremity was negative for DVT.  3. Fatty infiltration of liver  4. Obese  5. Irritable bowel syndrome  6. Hypertension  7. Osteoarthritis  8. Cyst on liver  9. Acute diverticulitis   Patient Active Problem List   Diagnosis Date Noted  . Diabetes type 2, uncontrolled 06/27/2013  . DVT of axillary vein, acute right 11/24/2012  . Diabetes mellitus type 2, diet-controlled 11/15/2012  . Acute diverticulitis 11/15/2012  . Ankle fracture 05/17/2012  . Ankle sprain 05/17/2012  . Bilateral ankle fractures 02/12/2012  . Contusion of shoulder, right 02/12/2012  . Chest pain, unspecified 05/10/2011  . Hypokalemia 05/10/2011  . Bradycardia 05/10/2011  . Giddiness 05/10/2011  .  HYPERCHOLESTEROLEMIA 08/29/2008  . DEPRESSION 08/29/2008  . HYPERTENSION 08/29/2008  . PEPTIC ULCER DISEASE 08/29/2008  . HIATAL HERNIA 08/29/2008  . CONSTIPATION 08/29/2008  . HEPATIC CYST 08/29/2008  . HEMATEMESIS 08/29/2008  . OSTEOARTHRITIS 08/29/2008  . NAUSEA ALONE 08/29/2008  . ABDOMINAL PAIN, CHRONIC 08/29/2008     PLAN:  1. I personally reviewed and went over laboratory results with the patient. 2. Next screening mammogram is due in March 2015. 3. D-Dimer today.  If elevated, will need to consider US/work-up 4. Patient educated on signs and symptoms of VTE 5. Follow-up with PCP as directed 6. Recommended referral to lymphedema clinic for right UE edema (which is minimal-mild).  She has declined at this time. 7. Patient education provided regarding post-thrombotic edema 8. Patient education regarding post-thrombotic discomfort. 9. Recommend a return to H2O aerobics 10. Return in 6 months for follow-up.    THERAPY PLAN:  The patient will discontinue her her and coagulation as of today, 03/12/2013. We'll see her in 6 months for followup. She was educated on signs and symptoms of VTE. We'll follow her over the next 18 months and release her from the clinic if she does not have any recurrent VTE. She'll follow the primary care physician as scheduled.    All questions were answered. The patient knows to call the clinic with any problems, questions or concerns. We can certainly see the patient much sooner if necessary.  Patient and plan discussed with Dr. Farrel Gobble and he is in agreement with the aforementioned.   KEFALAS,THOMAS

## 2013-09-12 ENCOUNTER — Encounter (HOSPITAL_COMMUNITY): Payer: Self-pay | Admitting: Oncology

## 2013-09-12 ENCOUNTER — Encounter (HOSPITAL_BASED_OUTPATIENT_CLINIC_OR_DEPARTMENT_OTHER): Payer: Medicare HMO

## 2013-09-12 ENCOUNTER — Encounter (HOSPITAL_COMMUNITY): Payer: Medicare HMO | Attending: Oncology | Admitting: Oncology

## 2013-09-12 VITALS — BP 136/49 | HR 62 | Temp 97.8°F | Resp 16 | Wt 246.3 lb

## 2013-09-12 DIAGNOSIS — K589 Irritable bowel syndrome without diarrhea: Secondary | ICD-10-CM | POA: Insufficient documentation

## 2013-09-12 DIAGNOSIS — I82A19 Acute embolism and thrombosis of unspecified axillary vein: Secondary | ICD-10-CM | POA: Insufficient documentation

## 2013-09-12 DIAGNOSIS — Z7901 Long term (current) use of anticoagulants: Secondary | ICD-10-CM | POA: Insufficient documentation

## 2013-09-12 DIAGNOSIS — I82A11 Acute embolism and thrombosis of right axillary vein: Secondary | ICD-10-CM

## 2013-09-12 DIAGNOSIS — E119 Type 2 diabetes mellitus without complications: Secondary | ICD-10-CM | POA: Insufficient documentation

## 2013-09-12 DIAGNOSIS — I10 Essential (primary) hypertension: Secondary | ICD-10-CM | POA: Insufficient documentation

## 2013-09-12 LAB — D-DIMER, QUANTITATIVE: D-Dimer, Quant: 0.45 ug/mL-FEU (ref 0.00–0.48)

## 2013-09-12 NOTE — Patient Instructions (Signed)
[facility] Discharge Instructions  RECOMMENDATIONS MADE BY THE CONSULTANT AND ANY TEST RESULTS WILL BE SENT TO YOUR REFERRING PHYSICIAN.  EXAM FINDINGS BY THE PHYSICIAN TODAY AND SIGNS OR SYMPTOMS TO REPORT TO CLINIC OR PRIMARY PHYSICIAN: Exam and findings as discussed by Robynn Pane, PA-C.  Will check some blood work today and if there are any abnormalities we will be in touch.  MEDICATIONS PRESCRIBED:  none  INSTRUCTIONS/FOLLOW-UP: Follow-up in 6 months.  Thank you for choosing [facility] to provide your oncology and hematology care.  To afford each patient quality time with our providers, please arrive at least 15 minutes before your scheduled appointment time.  With your help, our goal is to use those 15 minutes to complete the necessary work-up to ensure our physicians have the information they need to help with your evaluation and healthcare recommendations.    Effective January 1st, 2014, we ask that you re-schedule your appointment with our physicians should you arrive 10 or more minutes late for your appointment.  We strive to give you quality time with our providers, and arriving late affects you and other patients whose appointments are after yours.    Again, thank you for choosing [facility].  Our hope is that these requests will decrease the amount of time that you wait before being seen by our physicians.       _____________________________________________________________  Should you have questions after your visit to [facility], please contact our office at [phone] between the hours of 8:30 a.m. and 5:00 p.m.  Voicemails left after 4:30 p.m. will not be returned until the following business day.  For prescription refill requests, have your pharmacy contact our office with your prescription refill request.

## 2013-09-12 NOTE — Progress Notes (Signed)
Labs drawn today for dimer 

## 2013-09-25 ENCOUNTER — Other Ambulatory Visit: Payer: Self-pay | Admitting: Family Medicine

## 2013-09-25 DIAGNOSIS — Z09 Encounter for follow-up examination after completed treatment for conditions other than malignant neoplasm: Secondary | ICD-10-CM

## 2013-09-28 ENCOUNTER — Encounter: Payer: Self-pay | Admitting: Family Medicine

## 2013-09-28 ENCOUNTER — Ambulatory Visit (INDEPENDENT_AMBULATORY_CARE_PROVIDER_SITE_OTHER): Payer: Medicare HMO | Admitting: Family Medicine

## 2013-09-28 VITALS — BP 128/82 | Ht 65.0 in | Wt 250.0 lb

## 2013-09-28 DIAGNOSIS — E7800 Pure hypercholesterolemia, unspecified: Secondary | ICD-10-CM

## 2013-09-28 DIAGNOSIS — E119 Type 2 diabetes mellitus without complications: Secondary | ICD-10-CM

## 2013-09-28 DIAGNOSIS — M549 Dorsalgia, unspecified: Secondary | ICD-10-CM

## 2013-09-28 DIAGNOSIS — E876 Hypokalemia: Secondary | ICD-10-CM

## 2013-09-28 LAB — BASIC METABOLIC PANEL
BUN: 16 mg/dL (ref 6–23)
CO2: 32 mEq/L (ref 19–32)
Calcium: 9.7 mg/dL (ref 8.4–10.5)
Chloride: 98 mEq/L (ref 96–112)
Creat: 0.78 mg/dL (ref 0.50–1.10)
GLUCOSE: 94 mg/dL (ref 70–99)
POTASSIUM: 3.9 meq/L (ref 3.5–5.3)
SODIUM: 138 meq/L (ref 135–145)

## 2013-09-28 LAB — HEPATIC FUNCTION PANEL
ALT: 21 U/L (ref 0–35)
AST: 19 U/L (ref 0–37)
Albumin: 4.3 g/dL (ref 3.5–5.2)
Alkaline Phosphatase: 88 U/L (ref 39–117)
BILIRUBIN DIRECT: 0.1 mg/dL (ref 0.0–0.3)
Indirect Bilirubin: 0.3 mg/dL (ref 0.2–1.2)
TOTAL PROTEIN: 7.7 g/dL (ref 6.0–8.3)
Total Bilirubin: 0.4 mg/dL (ref 0.2–1.2)

## 2013-09-28 LAB — HEMOGLOBIN A1C
HEMOGLOBIN A1C: 6.7 % — AB (ref <5.7)
MEAN PLASMA GLUCOSE: 146 mg/dL — AB (ref <117)

## 2013-09-28 LAB — LIPID PANEL
CHOL/HDL RATIO: 4 ratio
Cholesterol: 186 mg/dL (ref 0–200)
HDL: 47 mg/dL (ref >39)
LDL Cholesterol: 121 mg/dL — ABNORMAL HIGH (ref 0–99)
Triglycerides: 92 mg/dL (ref <150)
VLDL: 18 mg/dL (ref 0–40)

## 2013-09-28 NOTE — Patient Instructions (Signed)
Do your labs today  Follow up in spring

## 2013-09-28 NOTE — Progress Notes (Signed)
   Subjective:    Patient ID: [name], female    DOB: 1945-12-01, 68 y.o.   MRN: [id number]  HPI Patient arrives for a follow up on back pain and back spasms. Patient states they are doing better-not as frequent. Patient denies any numbness into the hands. Denies any weakness. No difficulty breathing just soreness in her right upper back. She also has history diabetes hyperlipidemia and other problems for which I've told her she needs followup in the spring Review of Systems    see above. Objective:   Physical Exam Mild-to-moderate tenderness in the lower upper back. Lungs are clear hearts regular She does have chronic edema the right arm related to previous DVT       Assessment & Plan:  #1 previous DVT-no evidence of recurrent DVT. Oncology took her off of blood thinners. Does loss she doesn't have a reoccurrence than that will work fine. If she does have a reoccurrence we will have to rethink possible lifelong anticoagulants  #2 the chronic edema the right arm can be helped with a compression sleeve if she would like but she would have to wear that regularly at this present moment she isn't sure she will contact us  #3 right upper back muscle strain-continue current measures mild improvement  #4 chronic health issues orders for lab work was given she will do these and she will followup in the spring time for a comprehensive checkup regarding these issues

## 2013-09-29 LAB — MICROALBUMIN, URINE: Microalb, Ur: 0.52 mg/dL (ref 0.00–1.89)

## 2013-10-03 NOTE — Progress Notes (Signed)
Patient notified and verbalized understanding of the test results. No further questions. 

## 2013-11-07 ENCOUNTER — Ambulatory Visit (HOSPITAL_COMMUNITY)
Admission: RE | Admit: 2013-11-07 | Discharge: 2013-11-07 | Disposition: A | Discharge status: Home / Self Care | Payer: Medicare HMO | Source: Ambulatory Visit | Attending: Family Medicine | Admitting: Family Medicine

## 2013-11-07 ENCOUNTER — Encounter (HOSPITAL_COMMUNITY): Payer: Medicare HMO

## 2013-11-07 ENCOUNTER — Other Ambulatory Visit: Payer: Self-pay | Admitting: Family Medicine

## 2013-11-07 DIAGNOSIS — Z09 Encounter for follow-up examination after completed treatment for conditions other than malignant neoplasm: Secondary | ICD-10-CM

## 2013-11-07 DIAGNOSIS — Z1231 Encounter for screening mammogram for malignant neoplasm of breast: Secondary | ICD-10-CM | POA: Insufficient documentation

## 2013-11-13 ENCOUNTER — Encounter: Payer: Self-pay | Admitting: Family Medicine

## 2013-11-13 ENCOUNTER — Ambulatory Visit (INDEPENDENT_AMBULATORY_CARE_PROVIDER_SITE_OTHER): Payer: Medicare HMO | Admitting: Family Medicine

## 2013-11-13 VITALS — BP 132/88 | Ht 65.5 in | Wt 243.0 lb

## 2013-11-13 DIAGNOSIS — R5383 Other fatigue: Secondary | ICD-10-CM

## 2013-11-13 DIAGNOSIS — Z23 Encounter for immunization: Secondary | ICD-10-CM

## 2013-11-13 DIAGNOSIS — R5381 Other malaise: Secondary | ICD-10-CM

## 2013-11-13 DIAGNOSIS — Z0189 Encounter for other specified special examinations: Secondary | ICD-10-CM

## 2013-11-13 MED ORDER — METFORMIN HCL ER 500 MG PO TB24: 500.0000 mg | ORAL_TABLET | Freq: Every day | ORAL | Status: DC

## 2013-11-13 NOTE — Progress Notes (Signed)
   Subjective:    Patient ID: [name], female    DOB: 02/09/1946, 68 y.o.   MRN: [id number]  HPIMed check up. Patient had bloodwork Jan 30th. A1C was done then. Her lab work showed that her A1c is 6.7 it was 7.0 her morning sugars are actually looking pretty good she will now start checking her sugars periodically in the evening   Started last Friday, Pain in neck, left breast, arms, and back. She does a lot of physical activity at the [facility] she wonders if this could be causing problems  She does state occasional swelling in the left ankle  She also states the pain into the breast concerns her little bit she isn't sure if it soreness or a problem with the breast she has not felt any problems she is up-to-date on her mammogram  Fatigue for the past month. She does say she is trying eat healthier trying to become more physically active. Her LDL is mildly elevated but currently I feel that if this continues to stay elevated than we will pursue statin   Review of Systems  Constitutional: Negative for activity change, appetite change and fatigue.  Endocrine: Negative for polydipsia and polyphagia.  Genitourinary: Negative for frequency.  Neurological: Negative for weakness.  Psychiatric/Behavioral: Negative for confusion.       Objective:   Physical Exam  Vitals reviewed. Constitutional: She appears well-nourished. No distress.  Cardiovascular: Normal rate, regular rhythm and normal heart sounds.   No murmur heard. Pulmonary/Chest: Effort normal and breath sounds normal. No respiratory distress.  Musculoskeletal: She exhibits no edema.  Lymphadenopathy:    She has no cervical adenopathy.  Neurological: She is alert. She exhibits normal muscle tone.  Psychiatric: Her behavior is normal.          Assessment & Plan:  Pedal edema-only on the left ankle the calves are normal I find no evidence of any type problem most likely venous insufficiency  Muscle and back  pain-she has muscle pain in her upper back upper chest and arms related to a lot of exercises she's been doing she's been doing water aerobics and an exercise bike. I told her she could take over-the-counter Aleve if she would like to  DM-A1c doesn't look better than what it has been but she needs to be on metformin we will use extended release 500 mg once a day see if she tolerates it she will let us know.  Breast pain her breast exam is normal bilateral mammogram done last year was normal. Patient is doing a fair amount of physical activity this could be causing musculoskeletal pain in the pectoralis muscles absolutely no masses were felt  She will do a cholesterol profile before the next visit along with an A1c if continued elevation may need further intervention including statins and ACE inhibitor. Lipid, metastases 7, hemoglobin A1c, and urine micro-protein before next

## 2013-11-15 ENCOUNTER — Ambulatory Visit (HOSPITAL_COMMUNITY)
Admission: RE | Admit: 2013-11-15 | Discharge: 2013-11-15 | Disposition: A | Discharge status: Home / Self Care | Payer: Medicare HMO | Source: Ambulatory Visit | Attending: Family Medicine | Admitting: Family Medicine

## 2013-11-15 DIAGNOSIS — Z951 Presence of aortocoronary bypass graft: Secondary | ICD-10-CM | POA: Insufficient documentation

## 2013-12-26 ENCOUNTER — Ambulatory Visit: Payer: Medicare HMO | Admitting: Family Medicine

## 2014-03-11 NOTE — Progress Notes (Signed)
Kathleen Lange, MD [place] [id number]  DVT of axillary vein, acute right - Plan: CBC, D-dimer, quantitative  CURRENT THERAPY: Observation. Finished 12 months of anticoagulation on 03/12/2013.  INTERVAL HISTORY: [name] 68 y.o. female returns for  regular  visit for followup of DVT of the right axillary vein in the setting of Coumadin anticoagulation, typically therapeutic. She is S/P 12 months of anticoagulation with xarelto, finishing on 03/12/2013.  I personally reviewed and went over laboratory results with the patient.  The results are noted within this dictation.  I personally reviewed and went over radiographic studies with the patient.  The results are noted within this dictation.    She has a few complaints that are related to her chronic diseases: 1. Dizziness, which may be related to episodes of hypo/hyperglycemia 2. 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  This will hopefully become less frequent and resolve as time passes, but may last a lifetime.  I have encouraged her maintain her appointment with Dr. Wolfgang [name] on Friday as scheduled as she notes episodes of hypoglycemia (glucose in the 60's) and it is during these times that she experiences dizziness.  She may needs some altering of her glycemic medications, which I will defer to Dr. Wolfgang [name].  Hematologically, she denies any complaints and ROS questioning is negative.    Past Medical History  Diagnosis Date  . Hypertension   . Diverticulitis   . Cyst on liver  . Blood clot of artery under arm   . Diabetes mellitus     controlled by diet  . Menopause   . Fibrocystic breast disease   . Impaired glucose tolerance   . Hepatic cyst   . IBS (irritable bowel syndrome)   . DVT of axillary  vein, acute right 11/24/2012    Dx on Jan 28, 2012.      has HYPERCHOLESTEROLEMIA; DEPRESSION; HYPERTENSION; PEPTIC ULCER DISEASE; HIATAL HERNIA; HEPATIC CYST; OSTEOARTHRITIS; ABDOMINAL PAIN, CHRONIC; Chest pain, unspecified; Bradycardia; Giddiness; Diabetes mellitus type 2, diet-controlled; DVT of axillary vein, acute right; and Diabetes type 2, uncontrolled on her problem list.     is allergic to norvasc; penicillins; advair diskus; and metformin and related.  [name] does not currently have medications on file.  Past Surgical History  Procedure Laterality Date  . Back surgery    . Cholecystectomy    . Knee arthrocentesis  left  . Breast surgery  right    cyst removed  . Abdominal hysterectomy    . Tubal ligation    . Cast application  6/72/0947    Procedure: CAST APPLICATION;  Surgeon: Kathleen Civil, MD;  Location: [facility];  Service: Orthopedics;  Laterality: Right;  procedure room   . Colonoscopy      Denies any headaches, dizziness, double vision, fevers, chills, night sweats, nausea, vomiting, diarrhea, constipation, heart palpitations, shortness of breath, blood in stool, black tarry stool, urinary pain, urinary burning, urinary frequency, hematuria.   PHYSICAL EXAMINATION  ECOG PERFORMANCE STATUS: 1 - Symptomatic but completely ambulatory  Filed Vitals:   03/13/14 1000  BP: 151/61  Pulse: 85  Temp: 98 F (36.7 C)  Resp: 18    GENERAL:alert, no distress, well nourished, well developed, comfortable, cooperative, obese and smiling SKIN: skin color, texture, turgor are normal, no rashes or  significant lesions HEAD: Normocephalic, No masses, lesions, tenderness or abnormalities EYES: normal, PERRLA, EOMI, Conjunctiva are pink and non-injected EARS: External ears normal OROPHARYNX:mucous membranes are moist  NECK: supple, trachea midline LYMPH:  not examined BREAST:not examined LUNGS: not examined HEART: not examined ABDOMEN:obese BACK: Back symmetric,  no curvature. EXTREMITIES:no skin discoloration, no cyanosis  NEURO: alert & oriented x 3 with fluent speech, no focal motor/sensory deficits, gait normal   LABORATORY DATA: CBC    Component Value Date/Time   WBC 6.4 01/25/2013 1027   RBC 5.30* 01/25/2013 1027   HGB 12.9 01/25/2013 1027   HCT 39.3 01/25/2013 1027   PLT 325 01/25/2013 1027   MCV 74.2* 01/25/2013 1027   MCH 24.3* 01/25/2013 1027   MCHC 32.8 01/25/2013 1027   RDW 17.5* 01/25/2013 1027   LYMPHSABS 3.4 01/25/2013 1027   MONOABS 0.5 01/25/2013 1027   EOSABS 0.2 01/25/2013 1027   BASOSABS 0.0 01/25/2013 1027      Chemistry      Component Value Date/Time   NA 138 09/28/2013 1115   K 3.9 09/28/2013 1115   CL 98 09/28/2013 1115   CO2 32 09/28/2013 1115   BUN 16 09/28/2013 1115   CREATININE 0.78 09/28/2013 1115   CREATININE 0.78 12/14/2012 0936      Component Value Date/Time   CALCIUM 9.7 09/28/2013 1115   ALKPHOS 88 09/28/2013 1115   AST 19 09/28/2013 1115   ALT 21 09/28/2013 1115   BILITOT 0.4 09/28/2013 1115     Lab Results  Component Value Date   DDIMER 0.45 09/12/2013      ASSESSMENT:  1. Right UE Mild DVT in the axillary vein, mural thrombus, patent. Unprovoked. Diagnosed on 01/27/2013 on [place]. Treated with Coumadin, but subsequently diagnosed with a right upper extremity new occlusive blood clots on 11/28/2012. She was therefore switched to Xarelto anticoagulations and referred to [facility] coagulation clinic and seen by Dr. Joan [name]. Subsequent interventional radiology right upper extremity venogram was negative for DVT. As a result, anticoagulation was discontinued on 03/12/2013 and we will follow her.  2. New acute right subclavian DVT that is occlusive diagnosed on doppler US on 11/28/2012 at which time the patient was switched to Xarelto, but subsequently at [facility], venogram of right upper extremity was negative for DVT.  3. Fatty infiltration of liver  4. Obese  5. Irritable bowel syndrome  6. Hypertension  7. Osteoarthritis   8. Cyst on liver  9. Acute diverticulitis   Patient Active Problem List   Diagnosis Date Noted  . Diabetes type 2, uncontrolled 06/27/2013  . DVT of axillary vein, acute right 11/24/2012  . Diabetes mellitus type 2, diet-controlled 11/15/2012  . Chest pain, unspecified 05/10/2011  . Bradycardia 05/10/2011  . Giddiness 05/10/2011  . HYPERCHOLESTEROLEMIA 08/29/2008  . DEPRESSION 08/29/2008  . HYPERTENSION 08/29/2008  . PEPTIC ULCER DISEASE 08/29/2008  . HIATAL HERNIA 08/29/2008  . HEPATIC CYST 08/29/2008  . OSTEOARTHRITIS 08/29/2008  . ABDOMINAL PAIN, CHRONIC 08/29/2008     PLAN:  1. I personally reviewed and went over laboratory results with the patient.  The results are noted within this dictation. 2. I personally reviewed and went over radiographic studies with the patient.  The results are noted within this dictation.   3. Labs today: CBC, D-dimer 4. Labs in 6 months: CBC, D-Dimer 5. Return in 6 months for follow-up   THERAPY PLAN:  We will follow the patient for another 6 months, and if all is well, then we will  release her from the [facility] to follow-up with her primary care provider.  All questions were answered. The patient knows to call the clinic with any problems, questions or concerns. We can certainly see the patient much sooner if necessary.  Patient and plan discussed with Dr. Farrel Gobble and he is in agreement with the aforementioned.   KEFALAS,THOMAS 03/13/2014

## 2014-03-13 ENCOUNTER — Encounter (HOSPITAL_COMMUNITY): Payer: Self-pay | Admitting: Oncology

## 2014-03-13 ENCOUNTER — Encounter (HOSPITAL_BASED_OUTPATIENT_CLINIC_OR_DEPARTMENT_OTHER): Payer: Medicare HMO

## 2014-03-13 ENCOUNTER — Encounter (HOSPITAL_COMMUNITY): Payer: Medicare HMO | Attending: Oncology | Admitting: Oncology

## 2014-03-13 VITALS — BP 151/61 | HR 85 | Temp 98.0°F | Resp 18 | Wt 249.0 lb

## 2014-03-13 DIAGNOSIS — I82A11 Acute embolism and thrombosis of right axillary vein: Secondary | ICD-10-CM

## 2014-03-13 DIAGNOSIS — R079 Chest pain, unspecified: Secondary | ICD-10-CM | POA: Diagnosis not present

## 2014-03-13 DIAGNOSIS — I82A19 Acute embolism and thrombosis of unspecified axillary vein: Secondary | ICD-10-CM | POA: Diagnosis not present

## 2014-03-13 DIAGNOSIS — I82B19 Acute embolism and thrombosis of unspecified subclavian vein: Secondary | ICD-10-CM | POA: Diagnosis not present

## 2014-03-13 DIAGNOSIS — E119 Type 2 diabetes mellitus without complications: Secondary | ICD-10-CM | POA: Diagnosis not present

## 2014-03-13 DIAGNOSIS — I10 Essential (primary) hypertension: Secondary | ICD-10-CM | POA: Diagnosis not present

## 2014-03-13 LAB — CBC
HCT: 39.6 % (ref 36.0–46.0)
Hemoglobin: 12.9 g/dL (ref 12.0–15.0)
MCH: 24.8 pg — AB (ref 26.0–34.0)
MCHC: 32.6 g/dL (ref 30.0–36.0)
MCV: 76.2 fL — AB (ref 78.0–100.0)
PLATELETS: 329 10*3/uL (ref 150–400)
RBC: 5.2 MIL/uL — ABNORMAL HIGH (ref 3.87–5.11)
RDW: 16.4 % — ABNORMAL HIGH (ref 11.5–15.5)
WBC: 5.5 10*3/uL (ref 4.0–10.5)

## 2014-03-13 LAB — D-DIMER, QUANTITATIVE: D-Dimer, Quant: 0.54 ug/mL-FEU — ABNORMAL HIGH (ref 0.00–0.48)

## 2014-03-13 NOTE — Patient Instructions (Signed)
[facility] Discharge Instructions  RECOMMENDATIONS MADE BY THE CONSULTANT AND ANY TEST RESULTS WILL BE SENT TO YOUR REFERRING PHYSICIAN.  EXAM FINDINGS BY THE PHYSICIAN TODAY AND SIGNS OR SYMPTOMS TO REPORT TO CLINIC OR PRIMARY PHYSICIAN: Exam and findings as discussed by Robynn Pane, PA-C.  Will check labs today and if there are any concerns we will contact you.  Report any unequal swelling of your extremities or other concerns.     INSTRUCTIONS/FOLLOW-UP: Follow-up in 6 months with labs and office visit.  If all is well in 6 months we will probably discharge you from clinic.  Thank you for choosing [facility] to provide your oncology and hematology care.  To afford each patient quality time with our providers, please arrive at least 15 minutes before your scheduled appointment time.  With your help, our goal is to use those 15 minutes to complete the necessary work-up to ensure our physicians have the information they need to help with your evaluation and healthcare recommendations.    Effective January 1st, 2014, we ask that you re-schedule your appointment with our physicians should you arrive 10 or more minutes late for your appointment.  We strive to give you quality time with our providers, and arriving late affects you and other patients whose appointments are after yours.    Again, thank you for choosing [facility].  Our hope is that these requests will decrease the amount of time that you wait before being seen by our physicians.       _____________________________________________________________  Should you have questions after your visit to [facility], please contact our office at [phone] between the hours of 8:30 a.m. and 4:30 p.m.  Voicemails left after 4:30 p.m. will not be returned until the following business day.  For prescription refill requests, have your pharmacy contact our office with your  prescription refill request.    _______________________________________________________________  We hope that we have given you very good care.  You may receive a patient satisfaction survey in the mail, please complete it and return it as soon as possible.  We value your feedback!  _______________________________________________________________  Have you asked about our STAR program?  STAR stands for Survivorship Training and Rehabilitation, and this is a nationally recognized cancer care program that focuses on survivorship and rehabilitation.  Cancer and cancer treatments may cause problems, such as, pain, making you feel tired and keeping you from doing the things that you need or want to do. Cancer rehabilitation can help. Our goal is to reduce these troubling effects and help you have the best quality of life possible.  You may receive a survey from a nurse that asks questions about your current state of health.  Based on the survey results, all eligible patients will be referred to the [facility] program for an evaluation so we can better serve you!  A frequently asked questions sheet is available upon request.

## 2014-03-13 NOTE — Progress Notes (Signed)
LABS FOR DDIM,CBC.

## 2014-03-15 ENCOUNTER — Ambulatory Visit (INDEPENDENT_AMBULATORY_CARE_PROVIDER_SITE_OTHER): Payer: Medicare HMO | Admitting: Family Medicine

## 2014-03-15 ENCOUNTER — Encounter: Payer: Self-pay | Admitting: Family Medicine

## 2014-03-15 ENCOUNTER — Other Ambulatory Visit: Payer: Self-pay | Admitting: Family Medicine

## 2014-03-15 VITALS — BP 122/78 | Ht 65.5 in | Wt 248.0 lb

## 2014-03-15 DIAGNOSIS — R790 Abnormal level of blood mineral: Secondary | ICD-10-CM

## 2014-03-15 DIAGNOSIS — E7800 Pure hypercholesterolemia, unspecified: Secondary | ICD-10-CM

## 2014-03-15 DIAGNOSIS — I10 Essential (primary) hypertension: Secondary | ICD-10-CM

## 2014-03-15 DIAGNOSIS — R5383 Other fatigue: Secondary | ICD-10-CM

## 2014-03-15 DIAGNOSIS — R079 Chest pain, unspecified: Secondary | ICD-10-CM

## 2014-03-15 DIAGNOSIS — R5381 Other malaise: Secondary | ICD-10-CM

## 2014-03-15 DIAGNOSIS — E119 Type 2 diabetes mellitus without complications: Secondary | ICD-10-CM

## 2014-03-15 LAB — POCT GLYCOSYLATED HEMOGLOBIN (HGB A1C): Hemoglobin A1C: 6

## 2014-03-15 LAB — CBC WITH DIFFERENTIAL/PLATELET
Basophils Absolute: 0 10*3/uL (ref 0.0–0.1)
Basophils Relative: 0 % (ref 0–1)
EOS ABS: 0.2 10*3/uL (ref 0.0–0.7)
Eosinophils Relative: 3 % (ref 0–5)
HCT: 40.2 % (ref 36.0–46.0)
HEMOGLOBIN: 13.2 g/dL (ref 12.0–15.0)
Lymphocytes Relative: 52 % — ABNORMAL HIGH (ref 12–46)
Lymphs Abs: 3 10*3/uL (ref 0.7–4.0)
MCH: 24.6 pg — AB (ref 26.0–34.0)
MCHC: 32.8 g/dL (ref 30.0–36.0)
MCV: 74.9 fL — AB (ref 78.0–100.0)
MONO ABS: 0.4 10*3/uL (ref 0.1–1.0)
Monocytes Relative: 7 % (ref 3–12)
Neutro Abs: 2.2 10*3/uL (ref 1.7–7.7)
Neutrophils Relative %: 38 % — ABNORMAL LOW (ref 43–77)
Platelets: 348 10*3/uL (ref 150–400)
RBC: 5.37 MIL/uL — ABNORMAL HIGH (ref 3.87–5.11)
RDW: 16.6 % — ABNORMAL HIGH (ref 11.5–15.5)
WBC: 5.8 10*3/uL (ref 4.0–10.5)

## 2014-03-15 LAB — BASIC METABOLIC PANEL
BUN: 18 mg/dL (ref 6–23)
CO2: 28 meq/L (ref 19–32)
Calcium: 10 mg/dL (ref 8.4–10.5)
Chloride: 99 mEq/L (ref 96–112)
Creat: 0.84 mg/dL (ref 0.50–1.10)
GLUCOSE: 98 mg/dL (ref 70–99)
POTASSIUM: 4.5 meq/L (ref 3.5–5.3)
Sodium: 138 mEq/L (ref 135–145)

## 2014-03-15 LAB — HEPATIC FUNCTION PANEL
ALT: 18 U/L (ref 0–35)
AST: 18 U/L (ref 0–37)
Albumin: 4 g/dL (ref 3.5–5.2)
Alkaline Phosphatase: 85 U/L (ref 39–117)
BILIRUBIN DIRECT: 0.1 mg/dL (ref 0.0–0.3)
BILIRUBIN TOTAL: 0.4 mg/dL (ref 0.2–1.2)
Indirect Bilirubin: 0.3 mg/dL (ref 0.2–1.2)
Total Protein: 7.2 g/dL (ref 6.0–8.3)

## 2014-03-15 LAB — TSH: TSH: 1.351 u[IU]/mL (ref 0.350–4.500)

## 2014-03-15 LAB — LIPID PANEL
CHOL/HDL RATIO: 3.8 ratio
Cholesterol: 173 mg/dL (ref 0–200)
HDL: 45 mg/dL (ref >39)
LDL Cholesterol: 113 mg/dL — ABNORMAL HIGH (ref 0–99)
Triglycerides: 74 mg/dL (ref <150)
VLDL: 15 mg/dL (ref 0–40)

## 2014-03-15 MED ORDER — TRAMADOL HCL 50 MG PO TABS: 50.0000 mg | ORAL_TABLET | Freq: Four times a day (QID) | ORAL | Status: DC | PRN

## 2014-03-15 NOTE — Progress Notes (Signed)
   Subjective:    Patient ID: [name], female    DOB: 1945/10/04, 68 y.o.   MRN: [id number]  Diabetes She presents for her follow-up diabetic visit. She has type 2 diabetes mellitus. Pertinent negatives for hypoglycemia include no confusion. Associated symptoms include chest pain. Pertinent negatives for diabetes include no fatigue, no polydipsia, no polyphagia and no weakness. Risk factors for coronary artery disease include diabetes mellitus, obesity and post-menopausal. Current diabetic treatment includes oral agent (monotherapy).   Patient with complaint of nausea,chronic cough , burning in chest--she relates soreness pain discomfort in the left side of her chest hurts with certain movements hurts with certain activities denies any substernal aching with activity. , left breast pain-she relates breast pain she is concerned about this. , weakness-she relates a lot of weakness and tiredness over the past few months she wonders if something is gone wrong. She states her appetite is okay denies excessive thirst or urination denies sweats chills or fever , lack of energy , numbness in hands and numbness in feet and toes.-This comes and goes.   Review of Systems  Constitutional: Negative for activity change, appetite change and fatigue.  HENT: Negative for congestion.   Respiratory: Negative for cough and shortness of breath.   Cardiovascular: Positive for chest pain. Negative for leg swelling.  Gastrointestinal: Negative for abdominal pain.  Endocrine: Negative for polydipsia and polyphagia.  Genitourinary: Negative for frequency.  Neurological: Negative for weakness.  Psychiatric/Behavioral: Negative for confusion.       Objective:   Physical Exam  Vitals reviewed. Constitutional: She appears well-nourished. No distress.  Cardiovascular: Normal rate, regular rhythm and normal heart sounds.   No murmur heard. Pulmonary/Chest: Effort normal and breath sounds normal. No  respiratory distress.  Musculoskeletal: She exhibits no edema.  Lymphadenopathy:    She has no cervical adenopathy.  Neurological: She is alert. She exhibits normal muscle tone.  Psychiatric: Her behavior is normal.   Breast exam is normal no masses are felt significant tenderness in chest wall on the left side to the touch.  Diabetic foot exam completed patient does not have any neuropathy      Assessment & Plan:  1. Type II or unspecified type diabetes mellitus without mention of complication, not stated as uncontrolled Her A1c looks good she needs to continue current dietary measures medication and physical activity - POCT glycosylated hemoglobin (Hb A1C) - Microalbumin, urine  2. HYPERTENSION Blood pressure good continue current measures activity as well low-salt diet - Basic metabolic panel  3. Diabetes mellitus type 2, diet-controlled Diabetes good control as per above - Basic metabolic panel  4. Chest pain, unspecified I believe this chest pain is musculoskeletal should gradually get better over the next week if not let us know I do not feel the patient needs a stress test based on the symptoms - PR ELECTROCARDIOGRAM, COMPLETE - Basic metabolic panel  5. HYPERCHOLESTEROLEMIA She has a history hyperlipidemia she was told the importance of getting LDL below 100. - Lipid panel - Hepatic function panel - Basic metabolic panel  6. Other malaise and fatigue She's been having fatigue and tiredness we will check a CBC as well as thyroid function. Encourage weight loss regular physical activity. She denies sleep apnea symptoms. Denies being depressed. - CBC with Differential - TSH

## 2014-03-16 LAB — MICROALBUMIN, URINE: Microalb, Ur: 0.5 mg/dL (ref 0.00–1.89)

## 2014-03-18 LAB — IRON AND TIBC
%SAT: 16 % — AB (ref 20–55)
Iron: 63 ug/dL (ref 42–145)
TIBC: 388 ug/dL (ref 250–470)
UIBC: 325 ug/dL (ref 125–400)

## 2014-03-18 LAB — FERRITIN: FERRITIN: 78 ng/mL (ref 10–291)

## 2014-03-19 NOTE — Progress Notes (Signed)
Patient notified and verbalized understanding of the test results. Waiting the results of the ferritin and TIBC.

## 2014-03-20 NOTE — Progress Notes (Signed)
Patient notified and verbalized understanding of the test results. No further questions. She will pick up the hemoccult cards.

## 2014-03-20 NOTE — Addendum Note (Signed)
Addended byCharolotte Capuchin D on: 03/20/2014 01:44 PM   Modules accepted: Orders

## 2014-03-26 ENCOUNTER — Telehealth: Payer: Self-pay | Admitting: Family Medicine

## 2014-03-26 MED ORDER — DICLOFENAC SODIUM 75 MG PO TBEC: 75.0000 mg | DELAYED_RELEASE_TABLET | Freq: Two times a day (BID) | ORAL | Status: DC

## 2014-03-26 NOTE — Telephone Encounter (Signed)
Seen on 7/17  Dr Bary Leriche note: Patient with complaint of nausea,chronic cough, burning in chest--she relates soreness pain discomfort in the left side of her chest hurts with certain movements hurts with certain activities denies any substernal aching with activity.  4. Chest pain, unspecified  I believe this chest pain is musculoskeletal should gradually get better over the next week if not let us know I do not feel the patient needs a stress test based on the symptoms  - PR ELECTROCARDIOGRAM, COMPLETE  - Basic metabolic panel    "Let the patient know that all of her lab work overall looks good cholesterol" good range liver and kidney functions are good. Keep well as is."

## 2014-03-26 NOTE — Telephone Encounter (Signed)
Patient notified and verbalized understanding. 

## 2014-03-26 NOTE — Telephone Encounter (Signed)
Patient says that Dr. Nicki Reaper gave her Tramadol HCL 50 mg to address her chest pains, but she feels it is making her chest pains worse. Please advise ASAP.

## 2014-03-26 NOTE — Telephone Encounter (Signed)
Note reviewed, chest wall pain can take quite awhile to go away. Stop tramadol. voltaren 75 numb 28 onw bid with food  f u with dr Nicki Reaper next wk if pain perswists

## 2014-03-29 ENCOUNTER — Encounter: Payer: Self-pay | Admitting: Family Medicine

## 2014-06-18 ENCOUNTER — Ambulatory Visit (INDEPENDENT_AMBULATORY_CARE_PROVIDER_SITE_OTHER): Payer: Medicare HMO | Admitting: Family Medicine

## 2014-06-18 ENCOUNTER — Encounter: Payer: Self-pay | Admitting: Family Medicine

## 2014-06-18 ENCOUNTER — Telehealth: Payer: Self-pay | Admitting: Family Medicine

## 2014-06-18 VITALS — BP 132/82 | Ht 65.5 in | Wt 250.0 lb

## 2014-06-18 DIAGNOSIS — E119 Type 2 diabetes mellitus without complications: Secondary | ICD-10-CM

## 2014-06-18 DIAGNOSIS — R109 Unspecified abdominal pain: Secondary | ICD-10-CM

## 2014-06-18 DIAGNOSIS — E785 Hyperlipidemia, unspecified: Secondary | ICD-10-CM

## 2014-06-18 DIAGNOSIS — Z7901 Long term (current) use of anticoagulants: Secondary | ICD-10-CM

## 2014-06-18 DIAGNOSIS — Z23 Encounter for immunization: Secondary | ICD-10-CM

## 2014-06-18 LAB — POCT GLYCOSYLATED HEMOGLOBIN (HGB A1C): Hemoglobin A1C: 8

## 2014-06-18 MED ORDER — METFORMIN HCL 500 MG PO TABS: 500.0000 mg | ORAL_TABLET | Freq: Two times a day (BID) | ORAL | Status: DC

## 2014-06-18 NOTE — Telephone Encounter (Signed)
Please notify me when OV note for today's visit is done.  I need it to process precert for pt's CT Abd/Pelv with contrast, test is set up for Friday 06/21/14 & takes time for decision

## 2014-06-18 NOTE — Progress Notes (Signed)
   Subjective:    Patient ID: [name], female    DOB: [birth year], 68 y.o.   MRN: [id number]  Diabetes She presents for her follow-up diabetic visit. She has type 2 diabetes mellitus. There are no hypoglycemic associated symptoms. (Weakness) Current diabetic treatment includes oral agent (monotherapy). She is compliant with treatment all of the time. She participates in exercise intermittently. She monitors blood glucose at home 1-2 x per week. Her overall blood glucose range is 90-110 mg/dl. She does not see a podiatrist.Eye exam is current.   Pelvic pain for weeks, she has had mid abdominal and lower pelvic pain that's been going on for the past several weeks wakes her at night she describes pain as severe she also relates significant constipation along with this. Up-to-date on colonoscopy denies rectal bleeding  Legs are cramping at night   Review of Systems She describes over a month of severe abdominal pain no rectal bleeding but she also relates severe constipation which is a change for her she is up-to-date on colonoscopy. She denies any chest tightness pressure pain shortness of breath she does relate some burning in her feet.    Objective:   Physical Exam Lungs are clear heart is regular abdomen tender throughout the abdomen more in the midabdomen left lower quadrant no guarding or rebound but she relates as being very painful. Extremities no edema       Assessment & Plan:  #1 abdominal pain/pelvic pain she has a history of diverticula disease she's had 3-4 weeks of increasing pain and discomfort that wakes her up at night describes as a severe aching pain both in the midabdomen in the left lower carotid red she is tender on exam I believe this patient would be best served by having a CT scan of the abdomen and pelvis.  #2 diabetes subpar control she needs to do much better job watching her sugars in exercising and trying to bring her numbers under better control  increase metformin as directed.

## 2014-06-18 NOTE — Telephone Encounter (Signed)
Her note has been dictated thank you if you need of additional information please ask me

## 2014-06-19 LAB — CBC WITH DIFFERENTIAL/PLATELET
BASOS PCT: 0 % (ref 0–1)
Basophils Absolute: 0 10*3/uL (ref 0.0–0.1)
Eosinophils Absolute: 0.2 10*3/uL (ref 0.0–0.7)
Eosinophils Relative: 3 % (ref 0–5)
HCT: 39.4 % (ref 36.0–46.0)
Hemoglobin: 12.9 g/dL (ref 12.0–15.0)
LYMPHS ABS: 2.8 10*3/uL (ref 0.7–4.0)
Lymphocytes Relative: 44 % (ref 12–46)
MCH: 24.6 pg — AB (ref 26.0–34.0)
MCHC: 32.7 g/dL (ref 30.0–36.0)
MCV: 75.2 fL — ABNORMAL LOW (ref 78.0–100.0)
MONOS PCT: 9 % (ref 3–12)
Monocytes Absolute: 0.6 10*3/uL (ref 0.1–1.0)
NEUTROS ABS: 2.8 10*3/uL (ref 1.7–7.7)
Neutrophils Relative %: 44 % (ref 43–77)
PLATELETS: 328 10*3/uL (ref 150–400)
RBC: 5.24 MIL/uL — AB (ref 3.87–5.11)
RDW: 16 % — ABNORMAL HIGH (ref 11.5–15.5)
WBC: 6.3 10*3/uL (ref 4.0–10.5)

## 2014-06-19 LAB — HEPATIC FUNCTION PANEL
ALBUMIN: 4.1 g/dL (ref 3.5–5.2)
ALT: 19 U/L (ref 0–35)
AST: 18 U/L (ref 0–37)
Alkaline Phosphatase: 76 U/L (ref 39–117)
Bilirubin, Direct: 0.1 mg/dL (ref 0.0–0.3)
Indirect Bilirubin: 0.4 mg/dL (ref 0.2–1.2)
TOTAL PROTEIN: 7.1 g/dL (ref 6.0–8.3)
Total Bilirubin: 0.5 mg/dL (ref 0.2–1.2)

## 2014-06-19 LAB — BASIC METABOLIC PANEL
BUN: 20 mg/dL (ref 6–23)
CALCIUM: 9.7 mg/dL (ref 8.4–10.5)
CHLORIDE: 101 meq/L (ref 96–112)
CO2: 31 meq/L (ref 19–32)
CREATININE: 0.79 mg/dL (ref 0.50–1.10)
GLUCOSE: 101 mg/dL — AB (ref 70–99)
Potassium: 4.5 mEq/L (ref 3.5–5.3)
Sodium: 141 mEq/L (ref 135–145)

## 2014-06-19 LAB — LIPID PANEL
CHOLESTEROL: 167 mg/dL (ref 0–200)
HDL: 43 mg/dL (ref >39)
LDL Cholesterol: 107 mg/dL — ABNORMAL HIGH (ref 0–99)
TRIGLYCERIDES: 83 mg/dL (ref <150)
Total CHOL/HDL Ratio: 3.9 Ratio
VLDL: 17 mg/dL (ref 0–40)

## 2014-06-19 LAB — LIPASE: Lipase: 10 U/L (ref 0–75)

## 2014-06-20 ENCOUNTER — Other Ambulatory Visit: Payer: Self-pay

## 2014-06-20 MED ORDER — PRAVASTATIN SODIUM 20 MG PO TABS: ORAL_TABLET | ORAL | Status: DC

## 2014-06-21 ENCOUNTER — Ambulatory Visit (HOSPITAL_COMMUNITY)
Admission: RE | Admit: 2014-06-21 | Discharge: 2014-06-21 | Disposition: A | Discharge status: Home / Self Care | Payer: Medicare HMO | Source: Ambulatory Visit | Attending: Family Medicine | Admitting: Family Medicine

## 2014-06-21 DIAGNOSIS — R109 Unspecified abdominal pain: Secondary | ICD-10-CM

## 2014-06-21 DIAGNOSIS — K7689 Other specified diseases of liver: Secondary | ICD-10-CM | POA: Insufficient documentation

## 2014-06-21 DIAGNOSIS — K760 Fatty (change of) liver, not elsewhere classified: Secondary | ICD-10-CM | POA: Insufficient documentation

## 2014-06-21 DIAGNOSIS — R102 Pelvic and perineal pain: Secondary | ICD-10-CM | POA: Diagnosis present

## 2014-06-21 MED ORDER — IOHEXOL 300 MG/ML  SOLN
100.0000 mL | Freq: Once | INTRAMUSCULAR | Status: AC | PRN
  Administered 2014-06-21: 100 mL via INTRAVENOUS

## 2014-06-21 MED ORDER — SODIUM CHLORIDE 0.9 % IJ SOLN
INTRAMUSCULAR | Status: AC
  Filled 2014-06-21: qty 45

## 2014-06-25 ENCOUNTER — Other Ambulatory Visit: Payer: Self-pay | Admitting: *Deleted

## 2014-06-25 DIAGNOSIS — R109 Unspecified abdominal pain: Secondary | ICD-10-CM

## 2014-07-01 ENCOUNTER — Encounter: Payer: Self-pay | Admitting: Family Medicine

## 2014-07-03 ENCOUNTER — Encounter: Payer: Self-pay | Admitting: Family Medicine

## 2014-07-03 ENCOUNTER — Ambulatory Visit (INDEPENDENT_AMBULATORY_CARE_PROVIDER_SITE_OTHER): Payer: Medicare HMO | Admitting: Family Medicine

## 2014-07-03 VITALS — BP 134/80 | Ht 65.0 in | Wt 248.0 lb

## 2014-07-03 DIAGNOSIS — E118 Type 2 diabetes mellitus with unspecified complications: Secondary | ICD-10-CM

## 2014-07-03 DIAGNOSIS — Z23 Encounter for immunization: Secondary | ICD-10-CM

## 2014-07-03 LAB — POCT GLUCOSE (DEVICE FOR HOME USE): POC Glucose: 97 mg/dl (ref 70–99)

## 2014-07-03 MED ORDER — OMEPRAZOLE 20 MG PO CPDR: 20.0000 mg | DELAYED_RELEASE_CAPSULE | Freq: Every day | ORAL | Status: DC | PRN

## 2014-07-03 MED ORDER — DICYCLOMINE HCL 10 MG PO CAPS: 10.0000 mg | ORAL_CAPSULE | Freq: Three times a day (TID) | ORAL | Status: DC | PRN

## 2014-07-03 MED ORDER — HYDROCHLOROTHIAZIDE 25 MG PO TABS: 25.0000 mg | ORAL_TABLET | Freq: Every day | ORAL | Status: DC

## 2014-07-03 NOTE — Progress Notes (Signed)
   Subjective:    Patient ID: [name], female    DOB: 1945/12/25, 68 y.o.   MRN: [id number]  HPIfollow up on abd pain. Had CT scan and bloodwork. Pt states abd pain is some better. Needs refill on bentyl A1C done on bloodwork. Pt brought in blood sugar readings. Pt checked bloodsugar this am and it was 61. Recheck bloodsugar in office today and it was 97 after eating a snack.  Needs refill on HCTZ Having headaches. Pt thinks it is from stress. Had a death in the family and has been dealing with a lot of people in her home.  Needs prevnar 13.  PMH benign Review of Systems  Constitutional: Negative for activity change, appetite change and fatigue.  Endocrine: Negative for polydipsia and polyphagia.  Genitourinary: Negative for frequency.  Neurological: Negative for weakness.  Psychiatric/Behavioral: Negative for confusion.      patient relates intermittent cramping in the abdomen no bloody stools. Objective:   Physical Exam  Constitutional: She appears well-nourished. No distress.  Cardiovascular: Normal rate, regular rhythm and normal heart sounds.   No murmur heard. Pulmonary/Chest: Effort normal and breath sounds normal. No respiratory distress.  Abdominal: Soft. She exhibits no distension. There is no tenderness.  Musculoskeletal: She exhibits no edema.  Lymphadenopathy:    She has no cervical adenopathy.  Neurological: She is alert. She exhibits normal muscle tone.  Psychiatric: Her behavior is normal.  Vitals reviewed. she has subjective discomfort in her abdomen no guarding rebound or tenderness        Assessment & Plan:  If her bowel movements proceed to start becoming watery then she may end up needing that have adjustments on her metformin  Her low sugar spell this morning was because she skipped supper the importance of regular eating was discussed and healthy eating. If ongoing low sugars notify us otherwise follow-up 3 months

## 2014-07-31 ENCOUNTER — Other Ambulatory Visit: Payer: Self-pay | Admitting: Family Medicine

## 2014-07-31 ENCOUNTER — Ambulatory Visit (INDEPENDENT_AMBULATORY_CARE_PROVIDER_SITE_OTHER): Payer: Commercial Managed Care - HMO | Admitting: Family Medicine

## 2014-07-31 ENCOUNTER — Encounter: Payer: Self-pay | Admitting: Family Medicine

## 2014-07-31 VITALS — BP 132/76 | Ht 65.0 in | Wt 246.0 lb

## 2014-07-31 DIAGNOSIS — R109 Unspecified abdominal pain: Secondary | ICD-10-CM

## 2014-07-31 DIAGNOSIS — R0600 Dyspnea, unspecified: Secondary | ICD-10-CM

## 2014-07-31 DIAGNOSIS — R609 Edema, unspecified: Secondary | ICD-10-CM

## 2014-07-31 DIAGNOSIS — M79602 Pain in left arm: Secondary | ICD-10-CM

## 2014-07-31 NOTE — Progress Notes (Signed)
   Subjective:    Patient ID: [name], female    DOB: [birth year], 68 y.o.   MRN: [id number]  Abdominal Pain This is a new problem. The current episode started 1 to 4 weeks ago. The quality of the pain is sharp. The pain is aggravated by certain positions and eating. Treatments tried: rest, dicyclomine. The treatment provided no relief.  Had CT scan on 06/21/14.   Follow up on blood sugar. Sugars running between 66- 80's. Last A1C 8.0 on 06/18/14.  Painful knot on left arm. Started about 1 month ago. She has a history of blood clot in the right arm  Review of Systems  Gastrointestinal: Positive for abdominal pain.   she denies fever chills sweats having intermittent nausea no vomiting intermittent diarrhea as well     Objective:   Physical Exam mild tenderness in the bicep area no swelling noted. Lungs clear hearts regular pulse normal abdomen is soft no guarding rebound or tenderness  Should be noted that the patient has had a CT examination of her abdomen which did not show any tumor she is up-to-date on colonoscopy she had a complete hysterectomy in the past    Assessment & Plan:  Left arm pain-she has a history of a DVT in the right subclavian she is now complaining of left bicep pain and discomfort she denies any swelling but she is worried about a blood clot we will go ahead and set her up for an ultrasound. There is no swelling of that arm but there is pain in she has a history of a blood clot  DM-she states recently her sugar levels have been low but we may have put her on a different medicine because I believe the medication is causing her trouble  Abd pain-I believe that this could be related to metformin stop the metformin. Also patient to follow her sugars and those readings back to [place] more than likely will need to be put on a different diabetic medicine.

## 2014-07-31 NOTE — Patient Instructions (Signed)
Stop metformin  Send [place] readings in 1 week with update on the abdominal pain

## 2014-08-01 ENCOUNTER — Ambulatory Visit (HOSPITAL_COMMUNITY)
Admission: RE | Admit: 2014-08-01 | Discharge: 2014-08-01 | Disposition: A | Discharge status: Home / Self Care | Payer: Commercial Managed Care - HMO | Source: Ambulatory Visit | Attending: Family Medicine | Admitting: Family Medicine

## 2014-08-01 DIAGNOSIS — M79602 Pain in left arm: Secondary | ICD-10-CM | POA: Diagnosis not present

## 2014-08-01 LAB — D-DIMER, QUANTITATIVE: D-Dimer, Quant: 0.48 ug/mL-FEU (ref 0.00–0.48)

## 2014-08-08 ENCOUNTER — Telehealth: Payer: Self-pay | Admitting: Family Medicine

## 2014-08-08 DIAGNOSIS — R1030 Lower abdominal pain, unspecified: Secondary | ICD-10-CM

## 2014-08-08 NOTE — Telephone Encounter (Signed)
Pt is calling to give info on her abd pain, that she is yes still having  Lower abd pain in the pelvic area.   She was also told to take one weeks worth of readings, they are as Follows  12/2  66 12/3  131  pm 125 12/4  108 12/5   48, 135, 182  pm 118  (pt was having trouble with reading in the am, hence the 3 readings)   12/6  111  pm 120  12/7  112  pm  145  12/8  113 pm    158  12/9  114  pm    126 12/10 116

## 2014-08-08 NOTE — Telephone Encounter (Signed)
Spoke with patient and Dr. Nicki Reaper. Pt's abdominal pain did not improve with stopping the Metformin. So, Dr. Nicki Reaper told her to start back on it. Take Miralax for the constipation. I put in referral for GI. Pt notified and verbalized understanding.

## 2014-08-08 NOTE — Telephone Encounter (Signed)
Blood sugars overall are acceptable. I would watch diet stay physically active be careful with portions. I also recommend repeat A1c in 3 months. As for the lower abdominal pain nurses please talk with the patient confirm with the patient what is going on. I recommend referral to gastroenterology. There may be other tests if they need to do. Please discuss with the patient first then make referral thank you

## 2014-08-09 ENCOUNTER — Encounter: Payer: Self-pay | Admitting: Gastroenterology

## 2014-09-12 ENCOUNTER — Encounter (HOSPITAL_COMMUNITY): Payer: Commercial Managed Care - HMO | Attending: Oncology | Admitting: Oncology

## 2014-09-12 ENCOUNTER — Ambulatory Visit (HOSPITAL_COMMUNITY): Payer: Medicare HMO | Admitting: Oncology

## 2014-09-12 ENCOUNTER — Encounter (HOSPITAL_BASED_OUTPATIENT_CLINIC_OR_DEPARTMENT_OTHER): Payer: Commercial Managed Care - HMO

## 2014-09-12 ENCOUNTER — Encounter (HOSPITAL_COMMUNITY): Payer: Self-pay | Admitting: Oncology

## 2014-09-12 ENCOUNTER — Other Ambulatory Visit (HOSPITAL_COMMUNITY): Payer: Medicare HMO

## 2014-09-12 VITALS — BP 148/45 | HR 66 | Temp 98.2°F | Resp 18 | Wt 241.7 lb

## 2014-09-12 DIAGNOSIS — I82621 Acute embolism and thrombosis of deep veins of right upper extremity: Secondary | ICD-10-CM | POA: Diagnosis not present

## 2014-09-12 DIAGNOSIS — I82A11 Acute embolism and thrombosis of right axillary vein: Secondary | ICD-10-CM

## 2014-09-12 LAB — COMPREHENSIVE METABOLIC PANEL
ALBUMIN: 4.2 g/dL (ref 3.5–5.2)
ALK PHOS: 79 U/L (ref 39–117)
ALT: 21 U/L (ref 0–35)
AST: 22 U/L (ref 0–37)
Anion gap: 7 (ref 5–15)
BILIRUBIN TOTAL: 0.4 mg/dL (ref 0.3–1.2)
BUN: 20 mg/dL (ref 6–23)
CALCIUM: 9.3 mg/dL (ref 8.4–10.5)
CO2: 30 mmol/L (ref 19–32)
Chloride: 104 mEq/L (ref 96–112)
Creatinine, Ser: 0.87 mg/dL (ref 0.50–1.10)
GFR calc non Af Amer: 68 mL/min — ABNORMAL LOW (ref >90)
GFR, EST AFRICAN AMERICAN: 79 mL/min — AB (ref >90)
GLUCOSE: 93 mg/dL (ref 70–99)
Potassium: 3.6 mmol/L (ref 3.5–5.1)
Sodium: 141 mmol/L (ref 135–145)
Total Protein: 7.7 g/dL (ref 6.0–8.3)

## 2014-09-12 LAB — CBC WITH DIFFERENTIAL/PLATELET
BASOS ABS: 0 10*3/uL (ref 0.0–0.1)
Basophils Relative: 1 % (ref 0–1)
Eosinophils Absolute: 0.1 10*3/uL (ref 0.0–0.7)
Eosinophils Relative: 2 % (ref 0–5)
HCT: 39.8 % (ref 36.0–46.0)
Hemoglobin: 12.3 g/dL (ref 12.0–15.0)
LYMPHS ABS: 3 10*3/uL (ref 0.7–4.0)
Lymphocytes Relative: 46 % (ref 12–46)
MCH: 24 pg — AB (ref 26.0–34.0)
MCHC: 30.9 g/dL (ref 30.0–36.0)
MCV: 77.6 fL — ABNORMAL LOW (ref 78.0–100.0)
Monocytes Absolute: 0.6 10*3/uL (ref 0.1–1.0)
Monocytes Relative: 9 % (ref 3–12)
Neutro Abs: 2.8 10*3/uL (ref 1.7–7.7)
Neutrophils Relative %: 42 % — ABNORMAL LOW (ref 43–77)
Platelets: 356 10*3/uL (ref 150–400)
RBC: 5.13 MIL/uL — ABNORMAL HIGH (ref 3.87–5.11)
RDW: 16.6 % — AB (ref 11.5–15.5)
WBC: 6.5 10*3/uL (ref 4.0–10.5)

## 2014-09-12 LAB — D-DIMER, QUANTITATIVE: D-Dimer, Quant: 0.45 ug/mL-FEU (ref 0.00–0.48)

## 2014-09-12 NOTE — Patient Instructions (Signed)
[place] at [facility]  Discharge Instructions:  We repeat labs today.   We will repeat some minor labs in 3 months.  Return in 3 months for follow-up. _______________________________________________________________  Thank you for choosing [place] at [facility] to provide your oncology and hematology care.  To afford each patient quality time with our providers, please arrive at least 15 minutes before your scheduled appointment.  You need to re-schedule your appointment if you arrive 10 or more minutes late.  We strive to give you quality time with our providers, and arriving late affects you and other patients whose appointments are after yours.  Also, if you no show three or more times for appointments you may be dismissed from the clinic.  Again, thank you for choosing [place] at [place] hope is that these requests will allow you access to exceptional care and in a timely manner. _______________________________________________________________  If you have questions after your visit, please contact our office at [phone] between the hours of 8:30 a.m. and 5:00 p.m. Voicemails left after 4:30 p.m. will not be returned until the following business day. _______________________________________________________________  For prescription refill requests, have your pharmacy contact our office. _______________________________________________________________  Recommendations made by the consultant and any test results will be sent to your referring physician. _______________________________________________________________

## 2014-09-12 NOTE — Assessment & Plan Note (Signed)
DVT of the right axillary vein in the setting of Coumadin anticoagulation, typically therapeutic. She is S/P 12 months of anticoagulation with xarelto, finishing on 03/12/2013.  She has been seen by Dr. Joan Flores at [facility] in June 2014.  Her D-Dimer remains mildly elevated.  Labs today: CBC diff, CMET, lupus anticoagulant, Protein C and S total and activity, and D-Dimer.  Labs in 3 months: CBC diff and D-dimer.  Return in 3 months for follow-up and discussion regarding future plan.

## 2014-09-12 NOTE — Progress Notes (Signed)
Kathleen Lange, MD [place] [address]  DVT of axillary vein, acute right - Plan: CBC with Differential, Comprehensive metabolic panel, Lupus anticoagulant panel, Protein C activity, Protein C, total, Protein S activity, Protein S, total, D-dimer, quantitative, CBC with Differential, Comprehensive metabolic panel, Lupus anticoagulant panel, Protein C activity, Protein C, total, Protein S activity, Protein S, total, D-dimer, quantitative  CURRENT THERAPY:Observation. Finished 12 months of anticoagulation on 03/12/2013.  INTERVAL HISTORY: [name] 69 y.o. female returns for followup of DVT of the right axillary vein in the setting of Coumadin anticoagulation, typically therapeutic. She is S/P 12 months of anticoagulation with xarelto, finishing on 03/12/2013.   We will update labs today.  I personally reviewed and went over radiographic studies with the patient.  The results are noted within this dictation.  Left UE [place] is negative for any DVT on 08/01/2014.  She reports a left antecubital discomfort that she reported to [name] who performed the [place].  She notes that it comes and goes now, but is better.    She notes some abdominal discomfort that is going to be evaluated by [name] very soon.  She had a CT abd/pelvis which was stable.  She notes some cramping in her muscles and I will defer this to her primary care provider.  This may be medication induced.  Hematologically, she denies any signs or symptoms of active clotting.   Past Medical History  Diagnosis Date  . Hypertension   . Diverticulitis   . Cyst on liver  . Blood clot of artery under arm   . Diabetes mellitus     controlled by diet  . Menopause   . Fibrocystic breast disease   . Impaired glucose tolerance   . Hepatic cyst   . IBS (irritable bowel syndrome)   . DVT of axillary vein, acute right 11/24/2012    Dx on Jan 28, 2012.      has HYPERCHOLESTEROLEMIA;  DEPRESSION; HYPERTENSION; PEPTIC ULCER DISEASE; HIATAL HERNIA; HEPATIC CYST; OSTEOARTHRITIS; ABDOMINAL PAIN, CHRONIC; Chest pain, unspecified; Bradycardia; Giddiness; Diabetes mellitus type 2, diet-controlled; DVT of axillary vein, acute right; and Diabetes type 2, uncontrolled on her problem list.     is allergic to norvasc; penicillins; and advair diskus.  [name] had no medications administered during this visit.  Past Surgical History  Procedure Laterality Date  . Back surgery    . Cholecystectomy    . Knee arthrocentesis  left  . Breast surgery  right    cyst removed  . Abdominal hysterectomy    . Tubal ligation    . Cast application  0/93/2355    Procedure: CAST APPLICATION;  Surgeon: Kathleen Civil, MD;  Location: [facility];  Service: Orthopedics;  Laterality: Right;  procedure room   . Colonoscopy      Denies any headaches, dizziness, double vision, fevers, chills, night sweats, nausea, vomiting, diarrhea, constipation, chest pain, heart palpitations, shortness of breath, blood in stool, black tarry stool, urinary pain, urinary burning, urinary frequency, hematuria.   PHYSICAL EXAMINATION  ECOG PERFORMANCE STATUS: 1 - Symptomatic but completely ambulatory  Filed Vitals:   09/12/14 1034  BP: 148/45  Pulse: 66  Temp: 98.2 F (36.8 C)  Resp: 18    GENERAL:alert, no distress, well nourished, well developed, comfortable, cooperative and smiling SKIN: skin color, texture, turgor are normal, no rashes or significant lesions HEAD: Normocephalic, No masses, lesions, tenderness or abnormalities EYES: normal, PERRLA, EOMI,  Conjunctiva are pink and non-injected EARS: External ears normal OROPHARYNX:mucous membranes are moist  NECK: supple, trachea midline LYMPH:  not examined BREAST:not examined LUNGS: not examined HEART: not examined ABDOMEN:obese BACK: Back symmetric, no curvature. EXTREMITIES:less then 2 second capillary refill, no joint deformities, effusion,  or inflammation, no skin discoloration, negative findings: no erythema, heat, or pain on palpation of UE and LE.  Negative Homan's sign bilaterally.  NEURO: alert & oriented x 3 with fluent speech, no focal motor/sensory deficits, gait normal   LABORATORY DATA: CBC    Component Value Date/Time   WBC 6.3 06/19/2014 0833   RBC 5.24* 06/19/2014 0833   HGB 12.9 06/19/2014 0833   HCT 39.4 06/19/2014 0833   PLT 328 06/19/2014 0833   MCV 75.2* 06/19/2014 0833   MCH 24.6* 06/19/2014 0833   MCHC 32.7 06/19/2014 0833   RDW 16.0* 06/19/2014 0833   LYMPHSABS 2.8 06/19/2014 0833   MONOABS 0.6 06/19/2014 0833   EOSABS 0.2 06/19/2014 0833   BASOSABS 0.0 06/19/2014 0833      Chemistry      Component Value Date/Time   NA 141 06/19/2014 0833   K 4.5 06/19/2014 0833   CL 101 06/19/2014 0833   CO2 31 06/19/2014 0833   BUN 20 06/19/2014 0833   CREATININE 0.79 06/19/2014 0833   CREATININE 0.78 12/14/2012 0936      Component Value Date/Time   CALCIUM 9.7 06/19/2014 0833   ALKPHOS 76 06/19/2014 0833   AST 18 06/19/2014 0833   ALT 19 06/19/2014 0833   BILITOT 0.5 06/19/2014 0833        RADIOGRAPHIC STUDIES:  08/01/2014  CLINICAL DATA: Left biceps pain for approximately 1 month. History of prior DVT involving the right axillary and subclavian veins (11/2012). Evaluate for left upper extremity DVT.  EXAM: LEFT UPPER EXTREMITY VENOUS DOPPLER ULTRASOUND  TECHNIQUE: Gray-scale sonography with graded compression, as well as color Doppler and duplex ultrasound were performed to evaluate the upper extremity deep venous system from the level of the subclavian vein and including the jugular, axillary, basilic, radial, ulnar and upper cephalic vein. Spectral Doppler was utilized to evaluate flow at rest and with distal augmentation maneuvers.  COMPARISON: None.  FINDINGS: Contralateral Subclavian Vein: Respiratory phasicity is normal and symmetric with the symptomatic side. No  evidence of acute or chronic thrombus. Normal compressibility.  Internal Jugular Vein: No evidence of thrombus. Normal compressibility, respiratory phasicity and response to augmentation.  Subclavian Vein: No evidence of thrombus. Normal compressibility, respiratory phasicity and response to augmentation.  Axillary Vein: No evidence of thrombus. Normal compressibility, respiratory phasicity and response to augmentation.  Cephalic Vein: No evidence of thrombus. Normal compressibility, respiratory phasicity and response to augmentation.  Basilic Vein: No evidence of thrombus. Normal compressibility, respiratory phasicity and response to augmentation.  Brachial Veins: No evidence of thrombus. Normal compressibility, respiratory phasicity and response to augmentation.  Radial Veins: No evidence of thrombus. Normal compressibility, respiratory phasicity and response to augmentation.  Ulnar Veins: No evidence of thrombus. Normal compressibility, respiratory phasicity and response to augmentation.  Venous Reflux: None visualized.  Other Findings: None visualized.  IMPRESSION: 1. No evidence of DVT within the left upper extremity. 2. No evidence of acute or chronic DVT within the imaged contralateral right subclavian vein.   Electronically Signed  By: Sandi Mariscal M.D.  On: 08/01/2014 11:23    ASSESSMENT AND PLAN:  DVT of axillary vein, acute right DVT of the right axillary vein in the setting of Coumadin anticoagulation, typically therapeutic.  She is S/P 12 months of anticoagulation with xarelto, finishing on 03/12/2013.  She has been seen by Dr. Joan Flores at [facility] in June 2014.  Her D-Dimer remains mildly elevated.  Labs today: CBC diff, CMET, lupus anticoagulant, Protein C and S total and activity, and D-Dimer.  Labs in 3 months: CBC diff and D-dimer.  Return in 3 months for follow-up and discussion regarding future plan.    THERAPY PLAN:  Return in 3 months for  follow-up.  All questions were answered. The patient knows to call the clinic with any problems, questions or concerns. We can certainly see the patient much sooner if necessary.  Patient and plan discussed with Dr. Ancil Linsey and she is in agreement with the aforementioned.   KEFALAS,THOMAS 09/12/2014

## 2014-09-13 LAB — LUPUS ANTICOAGULANT PANEL
DRVVT: 33.4 secs (ref <42.9)
Lupus Anticoagulant: NOT DETECTED
PTT Lupus Anticoagulant: 36.1 secs (ref 28.0–43.0)

## 2014-09-13 NOTE — Progress Notes (Signed)
Labs for cbcd,cmp,lupac,prca,prct,prsa,prst,ddim

## 2014-09-15 LAB — PROTEIN S, TOTAL: Protein S Ag, Total: 133 % (ref 58–150)

## 2014-09-15 LAB — PROTEIN S ACTIVITY: PROTEIN S ACTIVITY: 109 % (ref 60–145)

## 2014-09-15 LAB — PROTEIN C, TOTAL: Protein C, Total: 106 % (ref 70–140)

## 2014-09-15 LAB — PROTEIN C ACTIVITY: Protein C Activity: 153 % — ABNORMAL HIGH (ref 74–151)

## 2014-09-16 ENCOUNTER — Encounter: Payer: Self-pay | Admitting: Nurse Practitioner

## 2014-09-16 ENCOUNTER — Ambulatory Visit (INDEPENDENT_AMBULATORY_CARE_PROVIDER_SITE_OTHER): Payer: Commercial Managed Care - HMO | Admitting: Nurse Practitioner

## 2014-09-16 ENCOUNTER — Other Ambulatory Visit: Payer: Self-pay

## 2014-09-16 VITALS — BP 154/68 | HR 64 | Temp 97.6°F | Ht 65.0 in | Wt 242.4 lb

## 2014-09-16 DIAGNOSIS — R1030 Lower abdominal pain, unspecified: Secondary | ICD-10-CM

## 2014-09-16 DIAGNOSIS — R634 Abnormal weight loss: Secondary | ICD-10-CM

## 2014-09-16 DIAGNOSIS — R630 Anorexia: Secondary | ICD-10-CM | POA: Diagnosis not present

## 2014-09-16 DIAGNOSIS — R109 Unspecified abdominal pain: Secondary | ICD-10-CM

## 2014-09-16 MED ORDER — PEG 3350-KCL-NA BICARB-NACL 420 G PO SOLR: 4000.0000 mL | ORAL | Status: DC

## 2014-09-16 NOTE — Progress Notes (Signed)
cc'ed to pcp °

## 2014-09-16 NOTE — Patient Instructions (Signed)
1. We will schedule your procedure (colonoscopy) with Dr. Oneida Alar. 2. The night before your procedure, only take half your regular Metformin and the morning of your procedure, do not take any of your metformin. 3. Further recommendations to be based on results of your procedure.

## 2014-09-16 NOTE — Assessment & Plan Note (Signed)
Persistent, constant, moderate to severe abdominal pain with subjective unintentional weight loss of 5 lbs in 3 weeks, new onset worsening fatigue in the past couple months, and decreased appetite. Review of past office visits connfirms weight loss of approximately 10 pounds in the past 3 months, unintentional per patient. Pain not relieved with a bowel movement. Last colonoscopy nearly 10 years ago and will be due this coming year. Has regular bowel movement that are of good consistency. Symptoms not generally consistent with IBS or GERD and unable to rule out more occult process. Will proceed with colonoscopy for further evaluation of colon.   Proceed with TCS with Dr. Oneida Alar in near future: the risks, benefits, and alternatives have been discussed with the patient in detail. The patient states understanding and desires to proceed.

## 2014-09-16 NOTE — Progress Notes (Signed)
Primary Care Physician:  [name], MD Primary Gastroenterologist:  Dr. Oneida Alar  Chief Complaint  Patient presents with  . Abdominal Pain    HPI:   69 year old female presents c/o abdominal pain, referred by her PCP. Initially was thought to be a side effect of metformin, however symptoms did not improve with temporarily stopping the metformin. Per PCP note pain started approximately September 2015 and is mid-abdominal and lower pelvic which is associated with significant constipation. Last colonoscopy 11/2004 with few small diverticula at sigmoid colon and one at ascending colon, small cecal polyp and two tiny sigmoid polyps all removed. Pathology not available in [facility]. CT abdomen/pelvis with contrast done 06/21/2014 showed no acute inflammatory process within the abdomen or pelvis, no hydronephrosis or hydroureter, slight progression of dominant cyst in right hepatic love 11.9 x 9.6 cm (prior exam 11.3 x 9.3 cm), mild hepatic fatty infiltration, normal appendix.  Patient confirms the above information about the onset of the pain and concurrent constipation. The pain is constant but varies in severity from 5-10 on a 10 point scale. It is described as crampy. No significant improvement with a bowel movement. It is located across the lower abdomen and bilateral lower quadrants.  Typically has a bowel movement 2-3 times a day. Her bowel movement is typically a 4 on the [facility] Stool scale. Patient states her PCP thinks she's constipated but she doesn't generally feel that she is. Denies hematochezia and melena. Denies dysphagia. Has scattered diverticula and has had a history of diverticulitis and states this does not feel like a flare of her diverticula. States this most feels like a severe menstrual cramp. Has had a hysterectomy. Has a history of GERD but is well controlled on omeprazole. Admits unintentional weight loss of about 5 pounds in the past 3 weeks. Denies any other upper or lower GI  symptoms.  Past Medical History  Diagnosis Date  . Hypertension   . Diverticulitis   . Cyst on liver  . Blood clot of artery under arm   . Diabetes mellitus     controlled by diet  . Menopause   . Fibrocystic breast disease   . Impaired glucose tolerance   . Hepatic cyst   . IBS (irritable bowel syndrome)   . DVT of axillary vein, acute right 11/24/2012    Dx on Jan 28, 2012.      Past Surgical History  Procedure Laterality Date  . Back surgery    . Cholecystectomy    . Knee arthrocentesis  left  . Breast surgery  right    cyst removed  . Abdominal hysterectomy    . Tubal ligation    . Cast application  4/85/4627    Procedure: CAST APPLICATION;  Surgeon: Carole Civil, MD;  Location: [facility];  Service: Orthopedics;  Laterality: Right;  procedure room   . Colonoscopy  12/04/2004    NUR:Few small diverticula at sigmoid colon/Small cecal polyp ablated     Current Outpatient Prescriptions  Medication Sig Dispense Refill  . acetaminophen (TYLENOL) 500 MG tablet Take 500 mg by mouth every 6 (six) hours as needed.    [name] aspirin 81 MG tablet Take 81 mg by mouth daily.    . Biotin w/ Vitamins C & E (HAIR SKIN & NAILS GUMMIES PO) Take 1 each by mouth 2 (two) times daily.    . Cholecalciferol (VITAMIN D3 PO) Take 1,000 Units by mouth daily.    [name] dicyclomine (BENTYL) 10 MG capsule Take  1 capsule (10 mg total) by mouth 3 (three) times daily as needed for spasms. 90 capsule 3  . hydrochlorothiazide (HYDRODIURIL) 25 MG tablet Take 1 tablet (25 mg total) by mouth daily. 90 tablet 3  . metFORMIN (GLUCOPHAGE) 500 MG tablet Take 1 tablet (500 mg total) by mouth 2 (two) times daily with a meal. 180 tablet 3  . Multiple Vitamins-Minerals (MULTIVITAMIN WITH MINERALS) tablet Take 1 tablet by mouth daily.    [name] omeprazole (PRILOSEC) 20 MG capsule Take 1 capsule (20 mg total) by mouth daily as needed. 90 capsule 3  . potassium chloride (K-DUR,KLOR-CON) 10 MEQ tablet Take 10 mEq by mouth daily.     . pravastatin (PRAVACHOL) 20 MG tablet Take 1/2 tablet daily 45 tablet 1  . Probiotic Product ([place]) CAPS Take by mouth daily.     No current facility-administered medications for this visit.    Allergies as of 09/16/2014 - Review Complete 09/16/2014  Allergen Reaction Noted  . Norvasc [amlodipine besylate]  11/21/2012  . Penicillins Hives   . Advair diskus [fluticasone-salmeterol] Palpitations 11/21/2012    Family History  Problem Relation Age of Onset  . Stroke Mother   . Heart attack Mother   . Cancer Sister   . Diabetes Sister   . Seizures Brother   . Diabetes Brother   . Heart disease Brother     History   Social History  . Marital Status: Married    Spouse Name: N/A    Number of Children: N/A  . Years of Education: N/A   Occupational History  . Not on file.   Social History Main Topics  . Smoking status: Never Smoker   . Smokeless tobacco: Never Used  . Alcohol Use: No  . Drug Use: No  . Sexual Activity: Yes   Other Topics Concern  . Not on file   Social History Narrative    Review of Systems: Gen: Denies any fever, chills, Admits recent worsening fatigue, 4-5 pound unintentional weight loss over the past 3 weeks, decreased of appetite over the past month.  CV: Denies chest pain, heart palpitations, peripheral edema, syncope.  Resp: Denies shortness of breath at rest or with exertion. Denies wheezing Frequent nighttime cough (patient attributes to allergies). Denies shortness of breath while laying down. GI: See HPI. MS: Denies joint pain, muscle weakness, cramps, or limitation of movement.  Derm: Denies rash, itching, dry skin Psych: Denies depression, anxiety, memory loss, and confusion Heme: Denies bruising, bleeding, and enlarged lymph nodes.  Physical Exam: BP 154/68 mmHg  Pulse 64  Temp(Src) 97.6 F (36.4 C)  Ht 5\' 5"  (1.651 m)  Wt 242 lb 6.4 oz (109.952 kg)  BMI 40.34 kg/m2 General:   Alert and oriented. Pleasant and  cooperative. Well-nourished and well-developed.  Head:  Normocephalic and atraumatic. Eyes:  Without icterus, sclera clear and conjunctiva pink.  Ears:  Normal auditory acuity. Mouth:  No deformity or lesions, oral mucosa pink. No OP edema. Neck:  Supple, without mass or thyromegaly. Lungs:  Clear to auscultation bilaterally. No wheezes, rales, or rhonchi. No distress.  Heart:  S1, S2 present without murmurs appreciated.  Abdomen:  +BS, obese, soft, and non-distended. Moderate tenderness to pan-lower abdomen but worse in the LLQ. No HSM noted. No guarding or rebound. No masses appreciated.  Rectal:  Deferred  Msk:  Symmetrical without gross deformities. Normal posture. Pulses:  Normal DP pulses noted. Extremities:  Without clubbing or edema. Neurologic:  Alert and  oriented x4;  grossly normal neurologically. Skin:  Intact without significant lesions or rashes. Psych:  Alert and cooperative. Normal mood and affect.     09/16/2014 10:35 AM

## 2014-09-18 ENCOUNTER — Other Ambulatory Visit (HOSPITAL_COMMUNITY): Payer: Medicare HMO

## 2014-09-18 ENCOUNTER — Ambulatory Visit (HOSPITAL_COMMUNITY): Payer: Medicare HMO | Admitting: Oncology

## 2014-09-24 ENCOUNTER — Encounter (HOSPITAL_COMMUNITY): Payer: Self-pay | Admitting: *Deleted

## 2014-09-24 ENCOUNTER — Encounter (HOSPITAL_COMMUNITY)
Admission: RE | Disposition: A | Discharge status: Home / Self Care | Payer: Self-pay | Source: Ambulatory Visit | Attending: Gastroenterology

## 2014-09-24 ENCOUNTER — Ambulatory Visit (HOSPITAL_COMMUNITY)
Admission: RE | Admit: 2014-09-24 | Discharge: 2014-09-24 | Disposition: A | Discharge status: Home / Self Care | Payer: Commercial Managed Care - HMO | Source: Ambulatory Visit | Attending: Gastroenterology | Admitting: Gastroenterology

## 2014-09-24 DIAGNOSIS — Z7982 Long term (current) use of aspirin: Secondary | ICD-10-CM | POA: Insufficient documentation

## 2014-09-24 DIAGNOSIS — R109 Unspecified abdominal pain: Secondary | ICD-10-CM

## 2014-09-24 DIAGNOSIS — D123 Benign neoplasm of transverse colon: Secondary | ICD-10-CM | POA: Insufficient documentation

## 2014-09-24 DIAGNOSIS — Z86718 Personal history of other venous thrombosis and embolism: Secondary | ICD-10-CM | POA: Diagnosis not present

## 2014-09-24 DIAGNOSIS — I10 Essential (primary) hypertension: Secondary | ICD-10-CM | POA: Diagnosis not present

## 2014-09-24 DIAGNOSIS — R634 Abnormal weight loss: Secondary | ICD-10-CM | POA: Insufficient documentation

## 2014-09-24 DIAGNOSIS — R194 Change in bowel habit: Secondary | ICD-10-CM | POA: Insufficient documentation

## 2014-09-24 DIAGNOSIS — E119 Type 2 diabetes mellitus without complications: Secondary | ICD-10-CM | POA: Diagnosis not present

## 2014-09-24 DIAGNOSIS — R1013 Epigastric pain: Secondary | ICD-10-CM | POA: Insufficient documentation

## 2014-09-24 DIAGNOSIS — Z79899 Other long term (current) drug therapy: Secondary | ICD-10-CM | POA: Diagnosis not present

## 2014-09-24 HISTORY — PX: COLONOSCOPY: SHX5424

## 2014-09-24 LAB — GLUCOSE, CAPILLARY: Glucose-Capillary: 99 mg/dL (ref 70–99)

## 2014-09-24 SURGERY — COLONOSCOPY
Anesthesia: Moderate Sedation

## 2014-09-24 MED ORDER — MIDAZOLAM HCL 5 MG/5ML IJ SOLN
INTRAMUSCULAR | Status: DC | PRN
  Administered 2014-09-24 (×3): 2 mg via INTRAVENOUS

## 2014-09-24 MED ORDER — MEPERIDINE HCL 100 MG/ML IJ SOLN
INTRAMUSCULAR | Status: DC | PRN
  Administered 2014-09-24 (×4): 25 mg via INTRAVENOUS

## 2014-09-24 MED ORDER — MEPERIDINE HCL 100 MG/ML IJ SOLN
INTRAMUSCULAR | Status: AC
  Filled 2014-09-24: qty 2

## 2014-09-24 MED ORDER — SODIUM CHLORIDE 0.9 % IV SOLN
INTRAVENOUS | Status: DC
  Administered 2014-09-24: 11:00:00 via INTRAVENOUS

## 2014-09-24 MED ORDER — MIDAZOLAM HCL 5 MG/5ML IJ SOLN
INTRAMUSCULAR | Status: AC
  Filled 2014-09-24: qty 10

## 2014-09-24 NOTE — Discharge Instructions (Addendum)
You had 3 small polyps removed. You have small internal hemorrhoids and diverticulosis IN YOUR RIGHT AND LEFT COLON. You have a floppy left colon.   FOLLOW A HIGH FIBER DIET. AVOID ITEMS THAT CAUSE BLOATING. SEE INFO BELOW.  YOUR BIOPSY WILL BE BACK IN 14 DAYS OR YOU CAN LOOK THEM UP ON MY CHART AFTER JAN 29.  Next colonoscopy in 5-10 years.   Colonoscopy Care After Read the instructions outlined below and refer to this sheet in the next week. These discharge instructions provide you with general information on caring for yourself after you leave the hospital. While your treatment has been planned according to the most current medical practices available, unavoidable complications occasionally occur. If you have any problems or questions after discharge, call DR. Allyn Bertoni, [phone].  ACTIVITY  You may resume your regular activity, but move at a slower pace for the next 24 hours.   Take frequent rest periods for the next 24 hours.   Walking will help get rid of the air and reduce the bloated feeling in your belly (abdomen).   No driving for 24 hours (because of the medicine (anesthesia) used during the test).   You may shower.   Do not sign any important legal documents or operate any machinery for 24 hours (because of the anesthesia used during the test).    NUTRITION  Drink plenty of fluids.   You may resume your normal diet as instructed by your doctor.   Begin with a light meal and progress to your normal diet. Heavy or fried foods are harder to digest and may make you feel sick to your stomach (nauseated).   Avoid alcoholic beverages for 24 hours or as instructed.    MEDICATIONS  You may resume your normal medications.   WHAT YOU CAN EXPECT TODAY  Some feelings of bloating in the abdomen.   Passage of more gas than usual.   Spotting of blood in your stool or on the toilet paper  .  IF YOU HAD POLYPS REMOVED DURING THE COLONOSCOPY:  Eat a soft diet IF YOU  HAVE NAUSEA, BLOATING, ABDOMINAL PAIN, OR VOMITING.    FINDING OUT THE RESULTS OF YOUR TEST Not all test results are available during your visit. DR. Oneida Alar WILL CALL YOU WITHIN 7 DAYS OF YOUR PROCEDUE WITH YOUR RESULTS. Do not assume everything is normal if you have not heard from DR. Stephie Xu IN ONE WEEK, CALL HER OFFICE AT [phone].  SEEK IMMEDIATE MEDICAL ATTENTION AND CALL THE OFFICE: [phone] IF:  You have more than a spotting of blood in your stool.   Your belly is swollen (abdominal distention).   You are nauseated or vomiting.   You have a temperature over 101F.   You have abdominal pain or discomfort that is severe or gets worse throughout the day.  Polyps, Colon  A polyp is extra tissue that grows inside your body. Colon polyps grow in the large intestine. The large intestine, also called the colon, is part of your digestive system. It is a long, hollow tube at the end of your digestive tract where your body makes and stores stool. Most polyps are not dangerous. They are benign. This means they are not cancerous. But over time, some types of polyps can turn into cancer. Polyps that are smaller than a pea are usually not harmful. But larger polyps could someday become or may already be cancerous. To be safe, doctors remove all polyps and test them.   WHO GETS POLYPS?  Anyone can get polyps, but certain people are more likely than others. You may have a greater chance of getting polyps if:  You are over 50.   You have had polyps before.   Someone in your family has had polyps.   Someone in your family has had cancer of the large intestine.   Find out if someone in your family has had polyps. You may also be more likely to get polyps if you:   Eat a lot of fatty foods   Smoke   Drink alcohol   Do not exercise  Eat too much   TREATMENT  The caregiver will remove the polyp during sigmoidoscopy or colonoscopy.  PREVENTION There is not one sure way to prevent  polyps. You might be able to lower your risk of getting them if you:  Eat more fruits and vegetables and less fatty food.   Do not smoke.   Avoid alcohol.   Exercise every day.   Lose weight if you are overweight.   Eating more calcium and folate can also lower your risk of getting polyps. Some foods that are rich in calcium are milk, cheese, and broccoli. Some foods that are rich in folate are chickpeas, kidney beans, and spinach.   High-Fiber Diet A high-fiber diet changes your normal diet to include more whole grains, legumes, fruits, and vegetables. Changes in the diet involve replacing refined carbohydrates with unrefined foods. The calorie level of the diet is essentially unchanged. The Dietary Reference Intake (recommended amount) for adult males is 38 grams per day. For adult females, it is 25 grams per day. Pregnant and lactating women should consume 28 grams of fiber per day. Fiber is the intact part of a plant that is not broken down during digestion. Functional fiber is fiber that has been isolated from the plant to provide a beneficial effect in the body. PURPOSE  Increase stool bulk.   Ease and regulate bowel movements.   Lower cholesterol.  INDICATIONS THAT YOU NEED MORE FIBER  Constipation and hemorrhoids.   Uncomplicated diverticulosis (intestine condition) and irritable bowel syndrome.   Weight management.   As a protective measure against hardening of the arteries (atherosclerosis), diabetes, and cancer.   GUIDELINES FOR INCREASING FIBER IN THE DIET  Start adding fiber to the diet slowly. A gradual increase of about 5 more grams (2 slices of whole-wheat bread, 2 servings of most fruits or vegetables, or 1 bowl of high-fiber cereal) per day is best. Too rapid an increase in fiber may result in constipation, flatulence, and bloating.   Drink enough water and fluids to keep your urine clear or pale yellow. Water, juice, or caffeine-free drinks are recommended.  Not drinking enough fluid may cause constipation.   Eat a variety of high-fiber foods rather than one type of fiber.   Try to increase your intake of fiber through using high-fiber foods rather than fiber pills or supplements that contain small amounts of fiber.   The goal is to change the types of food eaten. Do not supplement your present diet with high-fiber foods, but replace foods in your present diet.  INCLUDE A VARIETY OF FIBER SOURCES  Replace refined and processed grains with whole grains, canned fruits with fresh fruits, and incorporate other fiber sources. White rice, white breads, and most bakery goods contain little or no fiber.   Brown whole-grain rice, buckwheat oats, and many fruits and vegetables are all good sources of fiber. These include: broccoli, Brussels sprouts, cabbage, cauliflower,  beets, sweet potatoes, white potatoes (skin on), carrots, tomatoes, eggplant, squash, berries, fresh fruits, and dried fruits.   Cereals appear to be the richest source of fiber. Cereal fiber is found in whole grains and bran. Bran is the fiber-rich outer coat of cereal grain, which is largely removed in refining. In whole-grain cereals, the bran remains. In breakfast cereals, the largest amount of fiber is found in those with "bran" in their names. The fiber content is sometimes indicated on the label.   You may need to include additional fruits and vegetables each day.   In baking, for 1 cup white flour, you may use the following substitutions:   1 cup whole-wheat flour minus 2 tablespoons.   1/2 cup white flour plus 1/2 cup whole-wheat flour.   Diverticulosis Diverticulosis is a common condition that develops when small pouches (diverticula) form in the wall of the colon. The risk of diverticulosis increases with age. It happens more often in people who eat a low-fiber diet. Most individuals with diverticulosis have no symptoms. Those individuals with symptoms usually experience belly  (abdominal) pain, constipation, or loose stools (diarrhea).  HOME CARE INSTRUCTIONS  Increase the amount of fiber in your diet as directed by your caregiver or dietician. This may reduce symptoms of diverticulosis.   Drink at least 6 to 8 glasses of water each day to prevent constipation.   Try not to strain when you have a bowel movement.   Avoiding nuts and seeds to prevent complications is still an uncertain benefit.       FOODS HAVING HIGH FIBER CONTENT INCLUDE:  Fruits. Apple, peach, pear, tangerine, raisins, prunes.   Vegetables. Brussels sprouts, asparagus, broccoli, cabbage, carrot, cauliflower, romaine lettuce, spinach, summer squash, tomato, winter squash, zucchini.   Starchy Vegetables. Baked beans, kidney beans, lima beans, split peas, lentils, potatoes (with skin).   Grains. Whole wheat bread, brown rice, bran flake cereal, plain oatmeal, white rice, shredded wheat, bran muffins.    SEEK IMMEDIATE MEDICAL CARE IF:  You develop increasing pain or severe bloating.   You have an oral temperature above 101F.   You develop vomiting or bowel movements that are bloody or black.   Hemorrhoids Hemorrhoids are dilated (enlarged) veins around the rectum. Sometimes clots will form in the veins. This makes them swollen and painful. These are called thrombosed hemorrhoids. Causes of hemorrhoids include:  Constipation.   Straining to have a bowel movement.   HEAVY LIFTING HOME CARE INSTRUCTIONS  Eat a well balanced diet and drink 6 to 8 glasses of water every day to avoid constipation. You may also use a bulk laxative.   Avoid straining to have bowel movements.   Keep anal area dry and clean.   Do not use a donut shaped pillow or sit on the toilet for long periods. This increases blood pooling and pain.   Move your bowels when your body has the urge; this will require less straining and will decrease pain and pressure.

## 2014-09-24 NOTE — H&P (Signed)
Primary Care Physician:  [name], MD Primary Gastroenterologist:  Dr. Oneida Alar  Pre-Procedure History & Physical: HPI:  [name] is a 69 y.o. female here for ABDOMINAL PAIN/CHANGE IN BOWEL HABITS.  Past Medical History  Diagnosis Date  . Hypertension   . Diverticulitis   . Cyst on liver  . Blood clot of artery under arm   . Diabetes mellitus     controlled by diet  . Menopause   . Fibrocystic breast disease   . Impaired glucose tolerance   . Hepatic cyst   . IBS (irritable bowel syndrome)   . DVT of axillary vein, acute right 11/24/2012    Dx on Jan 28, 2012.      Past Surgical History  Procedure Laterality Date  . Back surgery    . Cholecystectomy    . Knee arthrocentesis  left  . Breast surgery  right    cyst removed  . Abdominal hysterectomy    . Tubal ligation    . Cast application  1/76/1607    Procedure: CAST APPLICATION;  Surgeon: Carole Civil, MD;  Location: [facility];  Service: Orthopedics;  Laterality: Right;  procedure room   . Colonoscopy  12/04/2004    NUR:Few small diverticula at sigmoid colon/Small cecal polyp ablated     Prior to Admission medications   Medication Sig Start Date End Date Taking? Authorizing Provider  acetaminophen (TYLENOL) 500 MG tablet Take 500 mg by mouth every 6 (six) hours as needed.   Yes Historical Provider, MD  aspirin 81 MG tablet Take 81 mg by mouth daily.   Yes Historical Provider, MD  Biotin w/ Vitamins C & E (HAIR SKIN & NAILS GUMMIES PO) Take 1 each by mouth 2 (two) times daily.   Yes Historical Provider, MD  Cholecalciferol (VITAMIN D3 PO) Take 1,000 Units by mouth daily.   Yes Historical Provider, MD  dicyclomine (BENTYL) 10 MG capsule Take 1 capsule (10 mg total) by mouth 3 (three) times daily as needed for spasms. 07/03/14  Yes [name], MD  hydrochlorothiazide (HYDRODIURIL) 25 MG tablet Take 1 tablet (25 mg total) by mouth daily. 07/03/14  Yes [name], MD  metFORMIN (GLUCOPHAGE) 500 MG  tablet Take 1 tablet (500 mg total) by mouth 2 (two) times daily with a meal. 06/18/14  Yes [name], MD  Multiple Vitamins-Minerals (MULTIVITAMIN WITH MINERALS) tablet Take 1 tablet by mouth daily.   Yes Historical Provider, MD  omeprazole (PRILOSEC) 20 MG capsule Take 1 capsule (20 mg total) by mouth daily as needed. Patient taking differently: Take 20 mg by mouth daily.  07/03/14  Yes [name], MD  polyethylene glycol-electrolytes (TRILYTE) 420 G solution Take 4,000 mLs by mouth as directed. 09/16/14  Yes Danie Binder, MD  potassium chloride (K-DUR,KLOR-CON) 10 MEQ tablet Take 10 mEq by mouth daily.   Yes Historical Provider, MD  pravastatin (PRAVACHOL) 20 MG tablet Take 1/2 tablet daily 06/20/14  Yes [name], MD  Probiotic Product ([place]) CAPS Take by mouth daily.   Yes Historical Provider, MD    Allergies as of 09/16/2014 - Review Complete 09/16/2014  Allergen Reaction Noted  . Norvasc [amlodipine besylate]  11/21/2012  . Penicillins Hives   . Advair diskus [fluticasone-salmeterol] Palpitations 11/21/2012    Family History  Problem Relation Age of Onset  . Stroke Mother   . Heart attack Mother   . Cancer Sister   . Diabetes Sister   . Seizures Brother   .  Diabetes Brother   . Heart disease Brother   . Colon cancer Neg Hx     History   Social History  . Marital Status: Married    Spouse Name: N/A    Number of Children: N/A  . Years of Education: N/A   Occupational History  . Retired    Social History Main Topics  . Smoking status: Never Smoker   . Smokeless tobacco: Never Used  . Alcohol Use: No  . Drug Use: No  . Sexual Activity: Yes   Other Topics Concern  . Not on file   Social History Narrative    Review of Systems: See HPI, otherwise negative ROS   Physical Exam: BP 146/71 mmHg  Pulse 69  Temp(Src) 97.9 F (36.6 C) (Oral)  Resp 20  Ht 5\' 5"  (1.651 m)  Wt 242 lb (109.77 kg)  BMI 40.27 kg/m2  SpO2  96% General:   Alert,  pleasant and cooperative in NAD Head:  Normocephalic and atraumatic. Neck:  Supple; Lungs:  Clear throughout to auscultation.    Heart:  Regular rate and rhythm. Abdomen:  Soft, nontender and nondistended. Normal bowel sounds, without guarding, and without rebound.   Neurologic:  Alert and  oriented x4;  grossly normal neurologically.  Impression/Plan:     Change in bowel habits  PLAN:  1. TCS TODAY

## 2014-09-24 NOTE — Op Note (Signed)
[facility] [address] [place], [address]   COLONOSCOPY PROCEDURE REPORT  PATIENT: [name], [name]  MR#: #[id number] BIRTHDATE: Feb 15, 1946 , 67  yrs. old GENDER: female ENDOSCOPIST: Danie Binder, MD REFERRED BY: PROCEDURE DATE:  2014-10-22 PROCEDURE:   Colonoscopy with biopsy and Colonoscopy with cold biopsy polypectomy INDICATIONS: MEDICATIONS:  DESCRIPTION OF PROCEDURE:    Physical exam was performed.  Informed consent was obtained from the patient after explaining the benefits, risks, and alternatives to procedure.  The patient was connected to monitor and placed in left lateral position. Continuous oxygen was provided by nasal cannula and IV medicine administered through an indwelling cannula.  After administration of sedation and rectal exam, the patients rectum was intubated and the EC-[id number] ([id number])  colonoscope was advanced under direct visualization to the ileum.  The scope was removed slowly by carefully examining the color, texture, anatomy, and integrity mucosa on the way out.  The patient was recovered in endoscopy and discharged home in satisfactory condition.       PREP QUALITY: CECAL W/D TIME: 86.3  minutes  COMPLICATIONS: None  ENDOSCOPIC IMPRESSION:   RECOMMENDATIONS:       _______________________________ eSignedDanie Binder, MD October 22, 2014 2:49 PM   CPT CODES: ICD CODES:  The ICD and CPT codes recommended by this software are interpretations from the data that the clinical staff has captured with the software.  The verification of the translation of this report to the ICD and CPT codes and modifiers is the sole responsibility of the health care institution and practicing physician where this report was generated.  [employer] will not be held responsible for the validity of the ICD and CPT codes included on this report.  AMA assumes no liability for data contained or not contained  herein. CPT is a Designer, television/film set of the [employer].    PATIENT NAME:  [name] MR#: #[id number]

## 2014-09-24 NOTE — Op Note (Signed)
NAMENARELLE, SCHOENING NO.:  [id number]  MEDICAL RECORD NO.:  [id number]  LOCATION:  [facility]                          FACILITY:  [facility]  PHYSICIAN:  Barney Drain, M.D.     DATE OF BIRTH:  August 29, 1946  DATE OF PROCEDURE:  09/24/2014 DATE OF DISCHARGE:  09/24/2014                              OPERATIVE REPORT   REFERRING PROVIDER:  Nicki Reaper A. Luking, MD  PROCEDURE:  Colonoscopy with random colon biopsies, cold forceps biopsy, and cold forceps polypectomy.  FINDINGS: 1. Normal terminal ileum, approximately 5 cm visualized. 2. 3 to 4 mm sessile transverse colon polyps removed via cold forceps. 3. Frequent pancolonic diverticula. 4. Extremely redundant rectosigmoid junction requiring pressure and     the patient being placed in the supine position to intubate the     ileum. 5. Small internal hemorrhoids.  IMPRESSION:  No obvious source for lower abdominal pain and intermittent GI upset identified.  RECOMMENDATIONS: 1. Follow high-fiber diet.  Avoid items that cause bloating. 2. Await biopsies. 3. Next colonoscopy in 5-10 years and consider overtube.  CECAL WITHDRAWAL TIME:  14 minutes.  MEDICATIONS: 1. Demerol 100 mg IV. 2. Versed 7 mg IV.  PROCEDURE TECHNIQUE:  Physical exam was performed.  Informed consent was obtained from the patient after explaining the benefits, risks, and alternatives to the procedure.  PROCEDURE:  The patient was connected to the monitor and placed in left lateral position.  Continuous oxygen was provided by nasal cannula and IV medicine administered through an indwelling cannula.  After administration of sedation and rectal exam, the patient's rectum was intubated, scope was advanced under direct visualization to the terminal ileum.  Scope was removed followed by careful exam of color, texture, anatomy, and integrity of the mucosa on the way out.  The patient was recovered in endoscopy and discharged home in satisfactory  condition.   PATH: NORMAL COLON Bx. 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 PT SHOULD DISCUSS HOLDING MEDS FOR 1 MO TO SEE IF GI UPSET RESOLVES.  SIMPLE ADENOMA x2: NEXT TCS IN 5-10 YEARS.  Barney Drain, M.D.     SF/MEDQ  D:  09/24/2014  T:  09/24/2014  Job:  [id number]  cc:   Nicki Reaper A. Wolfgang Phoenix, MD Fax: [phone]

## 2014-09-25 ENCOUNTER — Ambulatory Visit: Payer: Medicare HMO | Admitting: Family Medicine

## 2014-09-26 ENCOUNTER — Telehealth: Payer: Self-pay | Admitting: Gastroenterology

## 2014-09-26 ENCOUNTER — Ambulatory Visit (INDEPENDENT_AMBULATORY_CARE_PROVIDER_SITE_OTHER): Payer: Commercial Managed Care - HMO | Admitting: Family Medicine

## 2014-09-26 ENCOUNTER — Encounter: Payer: Self-pay | Admitting: Family Medicine

## 2014-09-26 VITALS — BP 132/80 | Temp 98.1°F | Ht 65.0 in | Wt 241.0 lb

## 2014-09-26 DIAGNOSIS — Z79899 Other long term (current) drug therapy: Secondary | ICD-10-CM

## 2014-09-26 DIAGNOSIS — E119 Type 2 diabetes mellitus without complications: Secondary | ICD-10-CM | POA: Diagnosis not present

## 2014-09-26 DIAGNOSIS — E785 Hyperlipidemia, unspecified: Secondary | ICD-10-CM

## 2014-09-26 LAB — HEPATIC FUNCTION PANEL
ALBUMIN: 4.2 g/dL (ref 3.5–5.2)
ALT: 17 U/L (ref 0–35)
AST: 16 U/L (ref 0–37)
Alkaline Phosphatase: 79 U/L (ref 39–117)
BILIRUBIN TOTAL: 0.4 mg/dL (ref 0.2–1.2)
Bilirubin, Direct: 0.1 mg/dL (ref 0.0–0.3)
Indirect Bilirubin: 0.3 mg/dL (ref 0.2–1.2)
Total Protein: 7.2 g/dL (ref 6.0–8.3)

## 2014-09-26 LAB — LIPID PANEL
CHOL/HDL RATIO: 3.4 ratio
CHOLESTEROL: 170 mg/dL (ref 0–200)
HDL: 50 mg/dL (ref >39)
LDL CALC: 103 mg/dL — AB (ref 0–99)
Triglycerides: 84 mg/dL (ref <150)
VLDL: 17 mg/dL (ref 0–40)

## 2014-09-26 LAB — POCT GLYCOSYLATED HEMOGLOBIN (HGB A1C): HEMOGLOBIN A1C: 6.6

## 2014-09-26 NOTE — Telephone Encounter (Signed)
I spoke to pt. She said she had some abdominal pain and she talked to Dr. Nicki Reaper and he told her to take a Bentyl.  She took it and it feels better. She has not had a BM since the procedure and I told her that is normal because she got cleaned out good. She will call tomorrow morning and let us know how she is.  If she has severe pain she will go to the ED.

## 2014-09-26 NOTE — Telephone Encounter (Signed)
PATIENT HAD COLONOSCOPY 09/24/14 AND HER STOMACH IS HURTING IN THE LOWER AREA   PLEASE CALL HER SHE WANTS TO KNOW IF THIS IS NORMAL

## 2014-09-26 NOTE — Progress Notes (Signed)
   Subjective:    Patient ID: CERIAH KOHLER, female    DOB: 1945/10/08, 69 y.o.   MRN: [id number]  Diabetes She presents for her follow-up diabetic visit. She has type 2 diabetes mellitus. She is following a diabetic diet. She never participates in exercise. Her breakfast blood glucose range is generally 90-110 mg/dl. She does not see a podiatrist.Eye exam is current.   Follow up on abd pain. Had colonoscopy on Tuesday.  Pt states she does not have an appetitie.    Review of Systems She denies vomiting some nausea since the colonoscopy denies wheezing difficulty breathing. States her sugars have been doing better she has been trying to lose weight    Objective:   Physical Exam  Lungs clear heart trigger pulse normal blood pressure good abdomen soft no guarding rebound or tenderness extremities no edema      Assessment & Plan:  #1 patient is done a great job with her diabetes getting under better control watching her diet losing weight she was encouraged to continue current regimen she would like to reduce the metformin to a half tablet twice daily. We can do that. Recheck A1c in approximately 3 months.  #2 intermittent abdominal pain related to the recent colonoscopy that she had this should gradually get better over the next week if problems call gastroenterology or follow-up with [place]  #3 weight loss patient doing fantastic she needs to keep up the effort  #4 she is tolerating the low-dose pravastatin she will go ahead with continuing this check lipid liver profile in 8 weeks

## 2014-09-27 NOTE — Telephone Encounter (Signed)
REVIEWED. AGREE. NO ADDITIONAL RECOMMENDATIONS. 

## 2014-10-01 MED ORDER — PRAVASTATIN SODIUM 20 MG PO TABS: 20.0000 mg | ORAL_TABLET | Freq: Every day | ORAL | Status: DC

## 2014-10-01 NOTE — Progress Notes (Signed)
Patient notified and verbalized understanding of the test results. No further questions. 

## 2014-10-01 NOTE — Addendum Note (Signed)
Addended byCharolotte Capuchin D on: 10/01/2014 10:33 AM   Modules accepted: Orders

## 2014-10-03 NOTE — Telephone Encounter (Addendum)
Please call pt. She had TWO simple adenomas removed. HER RANDOM COLON BIOPSIES ARE NORMAL. HER . GI UPSET IS MOST LIKELY DUE TO METFORMIN.    PT SHOULD DISCUSS WITH HER PCP A TRIAL OF HOLDING MEDS FOR 1 MO TO SEE IF GI UPSET RESOLVES. FOLLOW A HIGH FIBER DIET. OPV IN 4 MOS E30 RLQ ABD PAIN/LOOSE STOOLS NEXT TCS IN 5 YEARS WITH AN OVERTUBE AND ENDOCUFF.

## 2014-10-04 ENCOUNTER — Encounter: Payer: Self-pay | Admitting: Gastroenterology

## 2014-10-04 NOTE — Telephone Encounter (Signed)
APPOINTMENT MADE AND LETTER SENT, ON RECALL FOR TCS °

## 2014-10-04 NOTE — Telephone Encounter (Signed)
Pt is aware of results. 

## 2014-10-08 ENCOUNTER — Encounter (HOSPITAL_COMMUNITY): Payer: Self-pay | Admitting: Gastroenterology

## 2014-10-08 ENCOUNTER — Telehealth: Payer: Self-pay | Admitting: Family Medicine

## 2014-10-08 ENCOUNTER — Other Ambulatory Visit: Payer: Self-pay | Admitting: Family Medicine

## 2014-10-08 NOTE — Telephone Encounter (Signed)
Left message to return call 

## 2014-10-08 NOTE — Telephone Encounter (Signed)
Dr Oneida Alar advised her to call here to request that her Metformin be changed Due to it causing her a great deal of Upper GI issues.   [facility] Mail order   Question please call the patient

## 2014-10-08 NOTE — Telephone Encounter (Signed)
Typically in a situation like this we will use Tradjenta or Januvia these are medications that can help keep diabetes under control with a low risk of side effects and low risk of low sugar. Unfortunately these medicines do not come in generic. Most insurances will cover the medicine but it may be a higher co-pay. Please discuss with the patient to make sure that she is on board with this. Obviously we will stop the metformin. This should be listed in her Epic as stopped as well as listed as side effects under medication allergies. Thank you

## 2014-10-09 MED ORDER — LINAGLIPTIN 5 MG PO TABS: 5.0000 mg | ORAL_TABLET | Freq: Every day | ORAL | Status: DC

## 2014-10-09 NOTE — Telephone Encounter (Signed)
Patient states that she is willing to try the Tradjenta or Januvia. Send to [facility] order.

## 2014-10-09 NOTE — Telephone Encounter (Signed)
Rx sent electronically to pharmacy. Patient notified. 

## 2014-10-09 NOTE — Telephone Encounter (Signed)
Tradjenta 5 mg, 1 daily #30, 4 refills. It would not surprise me if her insurance covers a similar medicine we may have to change it

## 2014-11-07 ENCOUNTER — Other Ambulatory Visit: Payer: Self-pay | Admitting: Family Medicine

## 2014-11-12 ENCOUNTER — Telehealth: Payer: Self-pay | Admitting: *Deleted

## 2014-11-12 MED ORDER — GLIPIZIDE 5 MG PO TABS: 2.5000 mg | ORAL_TABLET | Freq: Two times a day (BID) | ORAL | Status: DC

## 2014-11-12 NOTE — Telephone Encounter (Signed)
Pt notified and verbalized understanding of: Catha Gosselin will need additional medication. I would recommend glipizide 5 mg tablet. I would recommend half of a tablet in the morning, half a tablet at suppertime. #30, 4 refills, she should keep track of her sugars at least 3-4 days per week in the morning and 3-4 days per week near supper or bedtime. She should eat healthy but should not skip meals. If low sugar spells let us know. Regular follow-up somewhere in a proximally 4 weeks would be a good idea, continue the other medication. 30 day supply sent to [facility] and 90 day sent to mail order.

## 2014-11-12 NOTE — Telephone Encounter (Signed)
Talk with patient, she will need additional medication. I would recommend glipizide 5 mg tablet. I would recommend half of a tablet in the morning, half a tablet at suppertime. #30, 4 refills, she should keep track of her sugars at least 3-4 days per week in the morning and 3-4 days per week near supper or bedtime. She should eat healthy but should not skip meals. If low sugar spells let us know. Regular follow-up somewhere in a proximally 4 weeks would be a good idea, continue the other medication

## 2014-11-12 NOTE — Telephone Encounter (Signed)
Pt said for the past 3-4 days, her blood sugars have been running high. She said in the mornings, they run about 150s and in the evenings its about 218.  Pt said she has had blurry vision with the high blood sugars.   Pt was recently put on Tradjenta 5 mg. She said the Metformin gave her abdominal pain.  Pt seen on 01/28 for diabetic check up. Next appt is 12/26/14

## 2014-11-20 ENCOUNTER — Telehealth: Payer: Self-pay | Admitting: Family Medicine

## 2014-11-20 NOTE — Telephone Encounter (Signed)
Patient notified that script will be faxed to pharmacy today.

## 2014-11-20 NOTE — Telephone Encounter (Signed)
Pt is needing a refill on her lancets and strips. The prodigy blood glucose strips and lancets. She  Needs these called into [facility].

## 2014-11-28 ENCOUNTER — Other Ambulatory Visit: Payer: Self-pay | Admitting: Family Medicine

## 2014-11-28 DIAGNOSIS — Z1231 Encounter for screening mammogram for malignant neoplasm of breast: Secondary | ICD-10-CM

## 2014-12-06 ENCOUNTER — Other Ambulatory Visit (HOSPITAL_COMMUNITY): Payer: Self-pay

## 2014-12-06 DIAGNOSIS — I82A11 Acute embolism and thrombosis of right axillary vein: Secondary | ICD-10-CM

## 2014-12-09 ENCOUNTER — Other Ambulatory Visit: Payer: Self-pay | Admitting: Family Medicine

## 2014-12-09 ENCOUNTER — Ambulatory Visit (HOSPITAL_COMMUNITY)
Admission: RE | Admit: 2014-12-09 | Discharge: 2014-12-09 | Disposition: A | Discharge status: Home / Self Care | Payer: Commercial Managed Care - HMO | Source: Ambulatory Visit | Attending: Family Medicine | Admitting: Family Medicine

## 2014-12-09 DIAGNOSIS — Z1231 Encounter for screening mammogram for malignant neoplasm of breast: Secondary | ICD-10-CM | POA: Diagnosis not present

## 2014-12-11 NOTE — Progress Notes (Signed)
[name], MD [place] [id number]  DVT of axillary vein, acute right - Plan: US Venous Img Upper Uni Right, CANCELED: US Venous Img Lower Unilateral Right  CURRENT THERAPY: Surveillance  INTERVAL HISTORY: GIAVANNI [name] 69 y.o. female returns for followup of DVT of the right axillary vein in the setting of Coumadin anticoagulation, typically therapeutic. She is S/P 12 months of anticoagulation with xarelto, finishing on 03/12/2013.   I personally reviewed and went over laboratory results with the patient.  The results are noted within this dictation.  Labs in Jan 2016 demonstrate normal lupus anticoagulant, protein c, and protein s testing.    I personally reviewed and went over radiographic studies with the patient.  The results are noted within this dictation. Her mammogram is BIRADS 1.  She reports Right UE edema pain.  She notes discomfort on the anterior aspect of deltoid.  Exam is benign.  D-Dimer today is WNL.  I have a low clinic suspicion for DVT, but given her history, I will perform an [place] today to verify.  If negative, she will be sent home, if positive, she will return to the clinic for further recommendations.    Hematologically, she denies any complaints and ROS questioning is negative.   Past Medical History  Diagnosis Date  . Hypertension   . Diverticulitis   . Cyst on liver  . Blood clot of artery under arm   . Diabetes mellitus     controlled by diet  . Menopause   . Fibrocystic breast disease   . Impaired glucose tolerance   . Hepatic cyst   . IBS (irritable bowel syndrome)   . DVT of axillary vein, acute right 11/24/2012    Dx on Jan 28, 2012.      has HYPERCHOLESTEROLEMIA; DEPRESSION; HYPERTENSION; PEPTIC ULCER DISEASE; HIATAL HERNIA; HEPATIC CYST; OSTEOARTHRITIS; ABDOMINAL PAIN, CHRONIC; Chest pain, unspecified; Bradycardia; Giddiness; Diabetes mellitus type 2, diet-controlled; DVT of axillary vein, acute right;  Diabetes type 2, uncontrolled; Abdominal pain; Decreased appetite; Unintentional weight loss; AP (abdominal pain); and Loss of weight on her problem list.     is allergic to norvasc; penicillins; advair diskus; and metformin and related.  Ms. Consuegra does not currently have medications on file.  Past Surgical History  Procedure Laterality Date  . Back surgery    . Cholecystectomy    . Knee arthrocentesis  left  . Breast surgery  right    cyst removed  . Abdominal hysterectomy    . Tubal ligation    . Cast application  9/74/1638    Procedure: CAST APPLICATION;  Surgeon: Carole Civil, MD;  Location: [facility];  Service: Orthopedics;  Laterality: Right;  procedure room   . Colonoscopy  12/04/2004    NUR:Few small diverticula at sigmoid colon/Small cecal polyp ablated   . Colonoscopy N/A 09/24/2014    Procedure: COLONOSCOPY;  Surgeon: Danie Binder, MD;  Location: [facility] SUITE;  Service: Endoscopy;  Laterality: N/A;  830-rescheduled to 09/24/14 @ 12:30pm Ginger notified pt     Denies any headaches, dizziness, double vision, fevers, chills, night sweats, nausea, vomiting, diarrhea, constipation, chest pain, heart palpitations, shortness of breath, blood in stool, black tarry stool, urinary pain, urinary burning, urinary frequency, hematuria.   PHYSICAL EXAMINATION  ECOG PERFORMANCE STATUS: 1 - Symptomatic but completely ambulatory  Filed Vitals:   12/12/14 0951  BP: 154/57  Pulse: 51  Temp: 97.7 F (36.5 C)  Resp:  15    GENERAL:alert, no distress, well nourished, well developed, comfortable, cooperative, obese and smiling SKIN: skin color, texture, turgor are normal, no rashes or significant lesions HEAD: Normocephalic, No masses, lesions, tenderness or abnormalities EYES: normal, PERRLA, EOMI, Conjunctiva are pink and non-injected EARS: External ears normal OROPHARYNX:lips, buccal mucosa, and tongue normal and mucous membranes are moist  NECK: supple, no adenopathy,  thyroid normal size, non-tender, without nodularity, no stridor, non-tender, trachea midline LYMPH:  no palpable lymphadenopathy BREAST:not examined LUNGS: clear to auscultation  HEART: regular rate & rhythm, no murmurs and no gallops ABDOMEN:abdomen soft, non-tender, obese and normal bowel sounds BACK: Back symmetric, no curvature., No CVA tenderness EXTREMITIES:less then 2 second capillary refill, no skin discoloration, no clubbing, no cyanosis, positive findings:  Right upper right extremity proximal to olecranon mild edema with some minimal tenderness to palpation on anterior aspect of deltoid without erythema or heat.  NEURO: alert & oriented x 3 with fluent speech, no focal motor/sensory deficits, gait normal   LABORATORY DATA: CBC    Component Value Date/Time   WBC 6.1 12/12/2014 0949   RBC 5.51* 12/12/2014 0949   HGB 13.2 12/12/2014 0949   HCT 41.3 12/12/2014 0949   PLT 311 12/12/2014 0949   MCV 75.0* 12/12/2014 0949   MCH 24.0* 12/12/2014 0949   MCHC 32.0 12/12/2014 0949   RDW 16.8* 12/12/2014 0949   LYMPHSABS 3.1 12/12/2014 0949   MONOABS 0.5 12/12/2014 0949   EOSABS 0.2 12/12/2014 0949   BASOSABS 0.0 12/12/2014 0949      Chemistry      Component Value Date/Time   NA 141 09/12/2014 1111   K 3.6 09/12/2014 1111   CL 104 09/12/2014 1111   CO2 30 09/12/2014 1111   BUN 20 09/12/2014 1111   CREATININE 0.87 09/12/2014 1111   CREATININE 0.79 06/19/2014 0833      Component Value Date/Time   CALCIUM 9.3 09/12/2014 1111   ALKPHOS 79 09/26/2014 1019   AST 16 09/26/2014 1019   ALT 17 09/26/2014 1019   BILITOT 0.4 09/26/2014 1019     Lab Results  Component Value Date   DDIMER 0.46 12/12/2014    RADIOGRAPHIC STUDIES:  Mm Digital Screening Bilateral  12/11/2014   CLINICAL DATA:  Screening.  EXAM: DIGITAL SCREENING BILATERAL MAMMOGRAM WITH CAD  COMPARISON:  Previous exam(s).  ACR Breast Density Category b: There are scattered areas of fibroglandular density.   FINDINGS: There are no findings suspicious for malignancy. Images were processed with CAD.  IMPRESSION: No mammographic evidence of malignancy. A result letter of this screening mammogram will be mailed directly to the patient.  RECOMMENDATION: Screening mammogram in one year. (Code:SM-B-01Y)  BI-RADS CATEGORY  1: Negative.   Electronically Signed   By: Lillia Mountain M.D.   On: 12/11/2014 14:43      ASSESSMENT AND PLAN:  DVT of axillary vein, acute right DVT of the right axillary vein in the setting of Coumadin anticoagulation, typically therapeutic. She is S/P 12 months of anticoagulation with xarelto, finishing on 03/12/2013.  She has been seen by Dr. Joan Flores at [facility] in June 2014.    Labs today: CBC diff, and D-Dimer.   Due to complaints, I will set her up for an UR of right arm to evaluate for DVT.  I have a low clinical suspicion for DVT.  I provided her education regarding post-DVT pain and edema.  Return in 6 months for follow-up and discussion regarding future plan versus discharge from the clinic.  THERAPY PLAN: Continue surveillance.  She is probably appropriate for discharge from the [facility] if no problems when she returns in 6 months.  Will defer to Dr. Whitney Muse.  All questions were answered. The patient knows to call the clinic with any problems, questions or concerns. We can certainly see the patient much sooner if necessary.  Patient and plan discussed with Dr. Ancil Linsey and she is in agreement with the aforementioned.   This note is electronically signed by: Robynn Pane 12/12/2014 10:30 AM

## 2014-12-11 NOTE — Assessment & Plan Note (Addendum)
DVT of the right axillary vein in the setting of Coumadin anticoagulation, typically therapeutic. She is S/P 12 months of anticoagulation with xarelto, finishing on 03/12/2013.  She has been seen by Dr. Joan Flores at [facility] in June 2014.    Labs today: CBC diff, and D-Dimer.   Due to complaints, I will set her up for an UR of right arm to evaluate for DVT.  I have a low clinical suspicion for DVT.  I provided her education regarding post-DVT pain and edema.  Return in 6 months for follow-up and discussion regarding future plan versus discharge from the clinic.

## 2014-12-12 ENCOUNTER — Ambulatory Visit (HOSPITAL_COMMUNITY)
Admission: RE | Admit: 2014-12-12 | Discharge: 2014-12-12 | Disposition: A | Discharge status: Home / Self Care | Payer: Commercial Managed Care - HMO | Source: Ambulatory Visit | Attending: Oncology | Admitting: Oncology

## 2014-12-12 ENCOUNTER — Encounter (HOSPITAL_COMMUNITY): Payer: Self-pay | Admitting: Oncology

## 2014-12-12 ENCOUNTER — Encounter (HOSPITAL_COMMUNITY): Payer: Commercial Managed Care - HMO

## 2014-12-12 ENCOUNTER — Encounter (HOSPITAL_COMMUNITY): Payer: Commercial Managed Care - HMO | Attending: Oncology | Admitting: Oncology

## 2014-12-12 VITALS — BP 154/57 | HR 51 | Temp 97.7°F | Resp 18 | Wt 240.7 lb

## 2014-12-12 DIAGNOSIS — M79601 Pain in right arm: Secondary | ICD-10-CM | POA: Insufficient documentation

## 2014-12-12 DIAGNOSIS — Z7901 Long term (current) use of anticoagulants: Secondary | ICD-10-CM

## 2014-12-12 DIAGNOSIS — I82A11 Acute embolism and thrombosis of right axillary vein: Secondary | ICD-10-CM

## 2014-12-12 DIAGNOSIS — Z86718 Personal history of other venous thrombosis and embolism: Secondary | ICD-10-CM

## 2014-12-12 DIAGNOSIS — M79621 Pain in right upper arm: Secondary | ICD-10-CM | POA: Diagnosis not present

## 2014-12-12 DIAGNOSIS — I82621 Acute embolism and thrombosis of deep veins of right upper extremity: Secondary | ICD-10-CM | POA: Diagnosis not present

## 2014-12-12 DIAGNOSIS — M7989 Other specified soft tissue disorders: Secondary | ICD-10-CM | POA: Diagnosis not present

## 2014-12-12 LAB — CBC WITH DIFFERENTIAL/PLATELET
BASOS PCT: 1 % (ref 0–1)
Basophils Absolute: 0 10*3/uL (ref 0.0–0.1)
EOS ABS: 0.2 10*3/uL (ref 0.0–0.7)
Eosinophils Relative: 3 % (ref 0–5)
HCT: 41.3 % (ref 36.0–46.0)
Hemoglobin: 13.2 g/dL (ref 12.0–15.0)
LYMPHS ABS: 3.1 10*3/uL (ref 0.7–4.0)
Lymphocytes Relative: 49 % — ABNORMAL HIGH (ref 12–46)
MCH: 24 pg — AB (ref 26.0–34.0)
MCHC: 32 g/dL (ref 30.0–36.0)
MCV: 75 fL — ABNORMAL LOW (ref 78.0–100.0)
Monocytes Absolute: 0.5 10*3/uL (ref 0.1–1.0)
Monocytes Relative: 8 % (ref 3–12)
NEUTROS PCT: 39 % — AB (ref 43–77)
Neutro Abs: 2.4 10*3/uL (ref 1.7–7.7)
PLATELETS: 311 10*3/uL (ref 150–400)
RBC: 5.51 MIL/uL — AB (ref 3.87–5.11)
RDW: 16.8 % — ABNORMAL HIGH (ref 11.5–15.5)
WBC: 6.1 10*3/uL (ref 4.0–10.5)

## 2014-12-12 LAB — D-DIMER, QUANTITATIVE: D-Dimer, Quant: 0.46 ug/mL-FEU (ref 0.00–0.48)

## 2014-12-12 NOTE — Patient Instructions (Signed)
..  [place] at [facility] Discharge Instructions  RECOMMENDATIONS MADE BY THE CONSULTANT AND ANY TEST RESULTS WILL BE SENT TO YOUR REFERRING PHYSICIAN.  U/S today of right arm If this is negative they will let you go home, follow their instructions once complete Return in 6 months, we will decide at that time if you can be released from clinic  Thank you for choosing [place] at [facility] to provide your oncology and hematology care.  To afford each patient quality time with our provider, please arrive at least 15 minutes before your scheduled appointment time.    You need to re-schedule your appointment should you arrive 10 or more minutes late.  We strive to give you quality time with our providers, and arriving late affects you and other patients whose appointments are after yours.  Also, if you no show three or more times for appointments you may be dismissed from the clinic at the providers discretion.     Again, thank you for choosing [facility].  Our hope is that these requests will decrease the amount of time that you wait before being seen by our physicians.       _____________________________________________________________  Should you have questions after your visit to [facility], please contact our office at [phone] between the hours of 8:30 a.m. and 4:30 p.m.  Voicemails left after 4:30 p.m. will not be returned until the following business day.  For prescription refill requests, have your pharmacy contact our office.

## 2014-12-12 NOTE — Progress Notes (Signed)
Labs drawn

## 2014-12-24 ENCOUNTER — Telehealth: Payer: Self-pay | Admitting: Family Medicine

## 2014-12-24 NOTE — Telephone Encounter (Signed)
Pt got a call from [facility] today and it told her to call our office to let us know whom was  Her eye doctor..she uses [facility] here in [place]   Just an Pharmacist, hospital

## 2014-12-26 ENCOUNTER — Ambulatory Visit (INDEPENDENT_AMBULATORY_CARE_PROVIDER_SITE_OTHER): Payer: Commercial Managed Care - HMO | Admitting: Family Medicine

## 2014-12-26 ENCOUNTER — Encounter: Payer: Self-pay | Admitting: Family Medicine

## 2014-12-26 VITALS — BP 112/78 | Ht 65.0 in | Wt 240.4 lb

## 2014-12-26 DIAGNOSIS — D508 Other iron deficiency anemias: Secondary | ICD-10-CM

## 2014-12-26 DIAGNOSIS — K5900 Constipation, unspecified: Secondary | ICD-10-CM

## 2014-12-26 DIAGNOSIS — E119 Type 2 diabetes mellitus without complications: Secondary | ICD-10-CM

## 2014-12-26 DIAGNOSIS — D510 Vitamin B12 deficiency anemia due to intrinsic factor deficiency: Secondary | ICD-10-CM

## 2014-12-26 DIAGNOSIS — R5383 Other fatigue: Secondary | ICD-10-CM | POA: Diagnosis not present

## 2014-12-26 DIAGNOSIS — G629 Polyneuropathy, unspecified: Secondary | ICD-10-CM

## 2014-12-26 LAB — POCT GLYCOSYLATED HEMOGLOBIN (HGB A1C): Hemoglobin A1C: 6.2

## 2014-12-26 NOTE — Progress Notes (Signed)
   Subjective:    Patient ID: [name], female    DOB: [birth year], 69 y.o.   MRN: [id number]  Diabetes She presents for her follow-up diabetic visit. She has type 2 diabetes mellitus. Her disease course has been stable. There are no hypoglycemic associated symptoms. Pertinent negatives for hypoglycemia include no confusion. There are no diabetic associated symptoms. Pertinent negatives for diabetes include no chest pain, no fatigue, no polydipsia, no polyphagia and no weakness. There are no hypoglycemic complications. Symptoms are stable. There are no diabetic complications. There are no known risk factors for coronary artery disease. Current diabetic treatment includes oral agent (dual therapy). She is compliant with treatment all of the time.   Patient states that she has a lot of issues with constipation.  This has been present for a while. bm every 2 to 3 days No blood up to date colonoscopy  Patient states that she doesn't have any energy and she has abdominal discomfort also.   Patient states that her feet and finger stay numb all the time.   Review of Systems  Constitutional: Negative for activity change, appetite change and fatigue.  HENT: Negative for congestion.   Respiratory: Negative for cough.   Cardiovascular: Negative for chest pain.  Gastrointestinal: Negative for abdominal pain.  Endocrine: Negative for polydipsia and polyphagia.  Neurological: Negative for weakness.  Psychiatric/Behavioral: Negative for confusion.       Objective:   Physical Exam  Constitutional: She appears well-nourished. No distress.  Cardiovascular: Normal rate, regular rhythm and normal heart sounds.   No murmur heard. Pulmonary/Chest: Effort normal and breath sounds normal. No respiratory distress.  Abdominal: Soft.  Musculoskeletal: She exhibits no edema.  Lymphadenopathy:    She has no cervical adenopathy.  Neurological: She is alert. She exhibits normal muscle tone.    Psychiatric: Her behavior is normal.  Vitals reviewed.         Assessment & Plan:  Increase miralax to twice daily, may use metamucil  1. Type 2 diabetes mellitus without complication Diabetes decent control continue current measures of low sugars let us know may need to reduce medicine of low sugars - POCT glycosylated hemoglobin (Hb A1C)  2. Other iron deficiency anemias Fatigue and tiredness has history of our deficiency check lab work await the results - Iron Binding Cap (TIBC) - Ferritin  3. Pernicious anemia Patient name peripheral neuropathy need to check B12 level wait the results - Vitamin B12  4. Other fatigue Fatigue and tiredness probably related to age and obesity encouraged regular physical activity and healthy eating. Await results of other tests  5. Peripheral neuropathy Neuropathy could be related B12 doubtful that it's related to diabetes but that could be. May need a trial off of pravastatin for 60 days causing could be related to cholesterol medicine symptoms in the fingers and feet  6. Constipation, unspecified constipation type She is up-to-date on colonoscopy we will double up on MiraLAX if that doesn't help we may have to try one of the expensive medicines for constipation  Patient relates left ankle at times gives way I showed her some strengthening exercises the physical exam was normal greater and 25 minutes spent with this patient

## 2014-12-27 LAB — IRON AND TIBC
Iron Saturation: 13 % — ABNORMAL LOW (ref 15–55)
Iron: 50 ug/dL (ref 27–139)
TIBC: 400 ug/dL (ref 250–450)
UIBC: 350 ug/dL (ref 118–369)

## 2014-12-27 LAB — VITAMIN B12: Vitamin B-12: 424 pg/mL (ref 211–946)

## 2014-12-27 LAB — FERRITIN: FERRITIN: 53 ng/mL (ref 15–150)

## 2014-12-29 ENCOUNTER — Encounter: Payer: Self-pay | Admitting: Family Medicine

## 2015-01-07 ENCOUNTER — Telehealth: Payer: Self-pay | Admitting: Family Medicine

## 2015-01-07 MED ORDER — LINAGLIPTIN 5 MG PO TABS: 5.0000 mg | ORAL_TABLET | Freq: Every day | ORAL | Status: DC

## 2015-01-07 NOTE — Telephone Encounter (Signed)
Rx sent electronically to pharmacy. Patient notified. 

## 2015-01-07 NOTE — Telephone Encounter (Signed)
TRADJENTA 5 MG TABS tablet   Pt would like a renewal on this med via [employer] order   Will be out within 3 days

## 2015-01-14 ENCOUNTER — Telehealth: Payer: Self-pay | Admitting: Family Medicine

## 2015-01-14 NOTE — Telephone Encounter (Signed)
Rx prior auth/tier exception is APPROVED for pt's linagliptin (TRADJENTA) 5 MG TABS tablet , approved through [facility] 08/30/14-08/30/15, ref# [id number]

## 2015-01-15 ENCOUNTER — Ambulatory Visit (INDEPENDENT_AMBULATORY_CARE_PROVIDER_SITE_OTHER): Payer: Commercial Managed Care - HMO | Admitting: Family Medicine

## 2015-01-15 ENCOUNTER — Encounter: Payer: Self-pay | Admitting: Family Medicine

## 2015-01-15 VITALS — BP 120/88 | Temp 98.1°F | Ht 65.0 in

## 2015-01-15 DIAGNOSIS — N61 Inflammatory disorders of breast: Secondary | ICD-10-CM | POA: Diagnosis not present

## 2015-01-15 MED ORDER — DOXYCYCLINE HYCLATE 100 MG PO CAPS: 100.0000 mg | ORAL_CAPSULE | Freq: Two times a day (BID) | ORAL | Status: DC

## 2015-01-15 MED ORDER — KETOCONAZOLE 2 % EX CREA: 1.0000 "application " | TOPICAL_CREAM | Freq: Two times a day (BID) | CUTANEOUS | Status: DC

## 2015-01-15 NOTE — Patient Instructions (Signed)
Warm compresses 10 minutes every 2 hours when awake  If abscess call  Recheck in 2 weeks

## 2015-01-15 NOTE — Progress Notes (Signed)
   Subjective:    Patient ID: [name], female    DOB: [birth year], 69 y.o.   MRN: [id number]  HPI Patient in today because she has a boil on the left breast. Patient states that boil has now started draining. Symptoms became present yesterday.   Denies having high fever chills but relates soreness and pain. Review of Systems    ROS per above Objective:   Physical Exam  On physical exam lungs clear heart regular she does have tenderness left lower part of breast this appears to be more of a localized infection      Assessment & Plan:  Localized cellulitis dockside can twice a day ketoconazole cream for localized yeast infection warm compresses follow-up in 2 weeks for recheck

## 2015-01-29 ENCOUNTER — Encounter: Payer: Self-pay | Admitting: Family Medicine

## 2015-01-29 ENCOUNTER — Ambulatory Visit (INDEPENDENT_AMBULATORY_CARE_PROVIDER_SITE_OTHER): Payer: Commercial Managed Care - HMO | Admitting: Family Medicine

## 2015-01-29 VITALS — BP 138/88 | Temp 98.0°F | Ht 65.0 in | Wt 242.0 lb

## 2015-01-29 DIAGNOSIS — N61 Inflammatory disorders of breast: Secondary | ICD-10-CM | POA: Diagnosis not present

## 2015-01-29 MED ORDER — CIPROFLOXACIN HCL 500 MG PO TABS: 500.0000 mg | ORAL_TABLET | Freq: Two times a day (BID) | ORAL | Status: DC

## 2015-01-29 NOTE — Progress Notes (Signed)
   Subjective:    Patient ID: [name], female    DOB: [birth year], 69 y.o.   MRN: [id number]  HPI Follow up on abscess on left breast. Pt states she is in a lot of pain. Pain when clothing touches breast. Finished doxy. Using ketoconazole cream twice daily. Pt states the pain is so bad that she thought she was having a heart attack and she is having nausea from the pain.   Pt states no other concerns today.  Patient denies chest pressure tightness pain she just states the breast is very tender. Denies fever chills or drainage  Review of Systems     Objective:   Physical Exam On physical exam lungs clear hearts regular chest wall nontender left breast tender no redness seen but there is some swelling around the area where the cellulitis was. I see no sign of abscess. Patient states the pain is severe.       Assessment & Plan:  Breast pain discomfort change antibodies recheck her again in 7-10 days I find no palpable mass side don't recommend any type of ultrasound or mammogram at this point.

## 2015-02-03 ENCOUNTER — Encounter: Payer: Self-pay | Admitting: Nurse Practitioner

## 2015-02-03 ENCOUNTER — Ambulatory Visit (INDEPENDENT_AMBULATORY_CARE_PROVIDER_SITE_OTHER): Payer: Commercial Managed Care - HMO | Admitting: Nurse Practitioner

## 2015-02-03 ENCOUNTER — Encounter (INDEPENDENT_AMBULATORY_CARE_PROVIDER_SITE_OTHER): Payer: Self-pay

## 2015-02-03 VITALS — BP 155/60 | HR 55 | Temp 97.0°F | Ht 64.0 in | Wt 242.0 lb

## 2015-02-03 DIAGNOSIS — R1030 Lower abdominal pain, unspecified: Secondary | ICD-10-CM | POA: Diagnosis not present

## 2015-02-03 DIAGNOSIS — K5900 Constipation, unspecified: Secondary | ICD-10-CM

## 2015-02-03 NOTE — Patient Instructions (Signed)
1. I'm providing a sample of Amitiza for you. Take this medication twice a day, once in the morning and once in the evening. Take this medication with a full meal to help prevent nausea side effects. 2. We will provide samples for 2 weeks. Please call us near the end of the 2 weeks and let us know if this medication is working for you. If it is we can for about a further prescription. If it is not, we can try different medication. 3. Return for follow-up in 3 months to assess the status of your symptoms.

## 2015-02-03 NOTE — Progress Notes (Signed)
Referring Provider: [name], MD Primary Care Physician:  [name], MD Primary GI:  Dr. Oneida Alar  Chief Complaint  Patient presents with  . Follow-up    HPI:   69 year old female presents on follow-up for abdominal pain and loose stools. Colonoscopy performed 09/24/14 found no source for symptoms, simple adenoma x 2, recommended  Repeat colonoscopy 5-10 years. Likely etiology of symptoms determined to be metformin. Patient switched off metformin by PCP and onto Tradjenta and glipizide. Hgb a1c in April was 6.2 (improved from 6.6 in January).  Today she states her symptoms have improved since coming off metformin. Currently is having significant constipation with hard stools. Has tried MiraLax and fiber supplement without success. Is having a bowel movement about once a day with hard stools consistent with [place] 2-3 and straining. Is having some lower abdominal abdominal pain (though much improved), and her pain improves after a bowel movement. Her pain and hard stools are consistent at or near 100% of the time. Denies hematochezia and melena. Denies other abdominal pain, N/V, chest pain, worsening dyspnea, dizziness, syncope, near syncope. Denies any other upper or lower GI symptoms.  Past Medical History  Diagnosis Date  . Hypertension   . Diverticulitis   . Cyst on liver  . Blood clot of artery under arm   . Diabetes mellitus     controlled by diet  . Menopause   . Fibrocystic breast disease   . Impaired glucose tolerance   . Hepatic cyst   . IBS (irritable bowel syndrome)   . DVT of axillary vein, acute right 11/24/2012    Dx on Jan 28, 2012.      Past Surgical History  Procedure Laterality Date  . Back surgery    . Cholecystectomy    . Knee arthrocentesis  left  . Breast surgery  right    cyst removed  . Abdominal hysterectomy    . Tubal ligation    . Cast application  2/29/7989    Procedure: CAST APPLICATION;  Surgeon: Carole Civil, MD;  Location: [facility]  ORS;  Service: Orthopedics;  Laterality: Right;  procedure room   . Colonoscopy  12/04/2004    NUR:Few small diverticula at sigmoid colon/Small cecal polyp ablated   . Colonoscopy N/A 09/24/2014    SLF: small internal hemorrhoids    Current Outpatient Prescriptions  Medication Sig Dispense Refill  . acetaminophen (TYLENOL) 500 MG tablet Take 500 mg by mouth every 6 (six) hours as needed.    . Alcohol Swabs (B-D SINGLE USE SWABS REGULAR) PADS USE AS DIRECTED 100 each 3  . aspirin 81 MG tablet Take 81 mg by mouth daily.    . Biotin w/ Vitamins C & E (HAIR SKIN & NAILS GUMMIES PO) Take 1 each by mouth 2 (two) times daily.    . Cholecalciferol (VITAMIN D3 PO) Take 1,000 Units by mouth daily.    . ciprofloxacin (CIPRO) 500 MG tablet Take 1 tablet (500 mg total) by mouth 2 (two) times daily. 20 tablet 0  . dicyclomine (BENTYL) 10 MG capsule Take 1 capsule (10 mg total) by mouth 3 (three) times daily as needed for spasms. 90 capsule 3  . glipiZIDE (GLUCOTROL) 5 MG tablet TAKE 1/2 TABLET TWICE DAILY  BEFORE  A  MEAL 90 tablet 0  . hydrochlorothiazide (HYDRODIURIL) 25 MG tablet Take 1 tablet (25 mg total) by mouth daily. 90 tablet 3  . ketoconazole (NIZORAL) 2 % cream Apply 1 application topically 2 (  two) times daily. 30 g 4  . linagliptin (TRADJENTA) 5 MG TABS tablet Take 1 tablet (5 mg total) by mouth daily. 90 tablet 0  . Multiple Vitamins-Minerals (MULTIVITAMIN WITH MINERALS) tablet Take 1 tablet by mouth daily.    [name] omeprazole (PRILOSEC) 20 MG capsule Take 1 capsule (20 mg total) by mouth daily as needed. (Patient taking differently: Take 20 mg by mouth daily. ) 90 capsule 3  . potassium chloride (K-DUR,KLOR-CON) 10 MEQ tablet TAKE 1 TABLET EVERY DAY 90 tablet 3  . pravastatin (PRAVACHOL) 20 MG tablet Take 1 tablet (20 mg total) by mouth daily. 90 tablet 1  . Probiotic Product ([place]) CAPS Take by mouth daily.     No current facility-administered medications for this visit.     Allergies as of 02/03/2015 - Review Complete 02/03/2015  Allergen Reaction Noted  . Norvasc [amlodipine besylate]  11/21/2012  . Penicillins Hives   . Advair diskus [fluticasone-salmeterol] Palpitations 11/21/2012  . Metformin and related  09/28/2013    Family History  Problem Relation Age of Onset  . Stroke Mother   . Heart attack Mother   . Cancer Sister   . Diabetes Sister   . Seizures Brother   . Diabetes Brother   . Heart disease Brother   . Colon cancer Neg Hx     History   Social History  . Marital Status: Married    Spouse Name: N/A  . Number of Children: N/A  . Years of Education: N/A   Occupational History  . Retired    Social History Main Topics  . Smoking status: Never Smoker   . Smokeless tobacco: Never Used  . Alcohol Use: No  . Drug Use: No  . Sexual Activity: Yes   Other Topics Concern  . None   Social History Narrative    Review of Systems: 10 point ROS negative exept as noted in HPI.   Physical Exam: BP 155/60 mmHg  Pulse 55  Temp(Src) 97 F (36.1 C)  Ht 5\' 4"  (1.626 m)  Wt 242 lb (109.77 kg)  BMI 41.52 kg/m2 General:   Alert and oriented. Pleasant and cooperative. Well-nourished and well-developed.  Head:  Normocephalic and atraumatic. Eyes:  Without icterus, sclera clear and conjunctiva pink.  Ears:  Normal auditory acuity. Cardiovascular:  S1, S2 present without murmurs appreciated. Normal pulses noted. Extremities without clubbing or edema. Respiratory:  Clear to auscultation bilaterally. No wheezes, rales, or rhonchi. No distress.  Gastrointestinal:  +BS, obese, soft, non-distended. Mild RLQ and lower abdominal pain noted. No HSM noted. No guarding or rebound. No masses appreciated.  Rectal:  Deferred  Skin:  Intact without significant lesions or rashes. Neurologic:  Alert and oriented x4;  grossly normal neurologically. Psych:  Alert and cooperative. Normal mood and affect. Heme/Lymph/Immune: No excessive bruising  noted.    02/03/2015 8:22 AM

## 2015-02-03 NOTE — Assessment & Plan Note (Signed)
Patient states abdominal pain is much improved since stopping metformin. However is having some residual pain as well as constipation. She has tried MiraLAX, fiber, stopping Bentyl, none of which has helped. Her abdominal pain is relieved with a bowel movement. Her bowel movements occur daily, but she has a sensation of incomplete emptying as well as hard stools that require straining. No hematochezia or melena. No other red flag/warning signs or symptoms. We'll trial her on Amitiza 24 g twice a day, with food, and provide samples for 2 weeks. We'll request patient called with the results of her medication to consider further prescription. Return for reevaluation in 3 months.

## 2015-02-03 NOTE — Assessment & Plan Note (Addendum)
Patient with constipation. States she has a bowel movement every day, however her stools are hard and require straining. Has increased the fiber in her diet as well as added MiraLAX as well as stopped Bentyl without success. Her symptoms occur with abdominal pain, which is relieved when she does have a bowel movement. We'll trial her on Amitiza 24 g twice a day with food for idiopathic constipation versus IBS D. We'll provide samples for 2 weeks and request that she call us with the results for effectiveness. Return for follow-up in 3 months. No red flag/warning signs or symptoms at this time. Recent colonoscopy unremarkable except for tubular adenoma polyps 2.

## 2015-02-04 NOTE — Progress Notes (Signed)
CC'ED TO PCP 

## 2015-02-06 ENCOUNTER — Encounter: Payer: Self-pay | Admitting: Family Medicine

## 2015-02-06 ENCOUNTER — Ambulatory Visit (INDEPENDENT_AMBULATORY_CARE_PROVIDER_SITE_OTHER): Payer: Commercial Managed Care - HMO | Admitting: Family Medicine

## 2015-02-06 VITALS — BP 122/80 | Ht 65.0 in | Wt 240.5 lb

## 2015-02-06 DIAGNOSIS — K5901 Slow transit constipation: Secondary | ICD-10-CM | POA: Diagnosis not present

## 2015-02-06 DIAGNOSIS — N61 Inflammatory disorders of breast: Secondary | ICD-10-CM

## 2015-02-06 NOTE — Progress Notes (Signed)
   Subjective:    Patient ID: [name], female    DOB: 12-31-1945, 69 y.o.   MRN: [id number]  HPI Patient is here today for a follow up visit on left breast pain.  Patient states that her breast pain has improved a lot. Currently taking Cipro. She relates minimal soreness. Mammogram earlier this year was negative.  Patient states that her gastroenterologist prescribed her Amitiza and it is causing stomach and back pain.  Patient had a long discussion today stating that she is having left side abdominal pain and discomfort since taking the new medication. She states she has had significant constipation for quite some time. She is up-to-date on colonoscopy area  Review of Systems Denies fever chills rectal bleeding.    Objective:   Physical Exam  Lungs are clear hearts regular left breast no swelling no masses are felt subjective soreness generalized.  Abdomen soft minimal left sided tenderness. No guarding or rebound      Assessment & Plan:  Constipation I recommend she try the medication once daily over the next week see if she can adjust to it if it continues to give her cramping sensation on the left side she should discuss this with the gastroenterologist. She is to try to get adequate water and fiber into her diet  Left breast pain cellulitis actually looks much better I find no evidence of cancer I recommend she follow-up in July for her comprehensive checkup

## 2015-02-19 ENCOUNTER — Telehealth: Payer: Self-pay

## 2015-02-19 NOTE — Telephone Encounter (Signed)
Please contact the patient and/or the insurance company and see what they mean. If she is on [employer] we should have vouchers as well. Not sure if the ins company is referring to a PA or to a possible drug assistance program ("[employer]" or other).

## 2015-02-19 NOTE — Telephone Encounter (Signed)
Pt is calling to let us know that the Amitiza 24 mcg is working. She would like to get a couple more boxes of samples and have a RX called into her pharmacy. Please advise

## 2015-02-19 NOTE — Telephone Encounter (Signed)
Pt came in to get her samples and pt stated she spoke with her insurance company pt said the insurance told her the medication will cost 199$ they also told her to get the office to call and maybe they will come down, I told pt to let us know when she runs out of samples. Please advise

## 2015-02-20 NOTE — Telephone Encounter (Signed)
Called pharmacy, left a voicemail- asked them to check and let me know if the pt needs a PA or not.

## 2015-03-11 ENCOUNTER — Telehealth: Payer: Self-pay | Admitting: Gastroenterology

## 2015-03-11 MED ORDER — LUBIPROSTONE 24 MCG PO CAPS: 24.0000 ug | ORAL_CAPSULE | Freq: Two times a day (BID) | ORAL | Status: DC

## 2015-03-11 NOTE — Telephone Encounter (Signed)
Pt came to front office asking about her PA on her prescription that would cost her $199 and she needed more samples of Amitiza 29mcg. JL is aware

## 2015-03-11 NOTE — Addendum Note (Signed)
Addended by: Mahala Menghini on: 03/11/2015 12:10 PM   Modules accepted: Orders

## 2015-03-11 NOTE — Telephone Encounter (Signed)
Gave #4 boxes of amitiza 82mcg. rx has not been sent to the pharmacy yet. I do not know what the pharmacy needs until they get an rx.    Pt needs rx sent to [facility] mail order.

## 2015-03-11 NOTE — Telephone Encounter (Signed)
RX done. 

## 2015-03-25 ENCOUNTER — Ambulatory Visit (INDEPENDENT_AMBULATORY_CARE_PROVIDER_SITE_OTHER): Payer: Commercial Managed Care - HMO | Admitting: Family Medicine

## 2015-03-25 ENCOUNTER — Encounter: Payer: Self-pay | Admitting: Family Medicine

## 2015-03-25 VITALS — BP 122/76 | Ht 65.0 in | Wt 245.0 lb

## 2015-03-25 DIAGNOSIS — E785 Hyperlipidemia, unspecified: Secondary | ICD-10-CM

## 2015-03-25 DIAGNOSIS — Z79899 Other long term (current) drug therapy: Secondary | ICD-10-CM

## 2015-03-25 DIAGNOSIS — E119 Type 2 diabetes mellitus without complications: Secondary | ICD-10-CM

## 2015-03-25 DIAGNOSIS — R109 Unspecified abdominal pain: Secondary | ICD-10-CM

## 2015-03-25 MED ORDER — PRAVASTATIN SODIUM 20 MG PO TABS: 20.0000 mg | ORAL_TABLET | Freq: Every day | ORAL | Status: DC

## 2015-03-25 MED ORDER — CITALOPRAM HYDROBROMIDE 10 MG PO TABS: 10.0000 mg | ORAL_TABLET | Freq: Every day | ORAL | Status: DC

## 2015-03-25 MED ORDER — LINAGLIPTIN 5 MG PO TABS: 5.0000 mg | ORAL_TABLET | Freq: Every day | ORAL | Status: DC

## 2015-03-25 MED ORDER — GLIPIZIDE 5 MG PO TABS: ORAL_TABLET | ORAL | Status: DC

## 2015-03-25 MED ORDER — LUBIPROSTONE 24 MCG PO CAPS: 24.0000 ug | ORAL_CAPSULE | Freq: Two times a day (BID) | ORAL | Status: DC

## 2015-03-25 NOTE — Progress Notes (Signed)
   Subjective:    Patient ID: [name], female    DOB: 06/08/1946, 69 y.o.   MRN: [id number]  Diabetes She presents for her follow-up diabetic visit. She has type 2 diabetes mellitus. There are no hypoglycemic associated symptoms. Pertinent negatives for hypoglycemia include no confusion. There are no diabetic associated symptoms. Pertinent negatives for diabetes include no chest pain, no fatigue, no polydipsia, no polyphagia and no weakness. There are no hypoglycemic complications. There are no diabetic complications. There are no known risk factors for coronary artery disease. Current diabetic treatment includes oral agent (monotherapy). She is compliant with treatment all of the time.   Patient states that she feels dizzy and she feels like she is going to pass out. This has been present for about 2 weeks now. Fatigue also noted.  She states at times she feels dizzy when she sitting still but her blood pressure and sugars are overall in good  Patient states that she feels "sick on her stomach all the time". Patient states she feels bloated also. This has been present for 3 weeks.    patient denies being depressed but does relate being stressed   Review of Systems  Constitutional: Negative for activity change, appetite change and fatigue.  HENT: Negative for congestion.   Respiratory: Negative for cough.   Cardiovascular: Negative for chest pain.  Gastrointestinal: Negative for abdominal pain.  Endocrine: Negative for polydipsia and polyphagia.  Neurological: Negative for weakness.  Psychiatric/Behavioral: Negative for confusion.       Objective:   Physical Exam  Constitutional: She appears well-nourished. No distress.  Cardiovascular: Normal rate, regular rhythm and normal heart sounds.   No murmur heard. Pulmonary/Chest: Effort normal and breath sounds normal. No respiratory distress.  Musculoskeletal: She exhibits no edema.  Lymphadenopathy:    She has no cervical  adenopathy.  Neurological: She is alert. She exhibits normal muscle tone.  Psychiatric: Her behavior is normal.  Vitals reviewed.    no unilateral weakness      Assessment & Plan:  1. Type 2 diabetes mellitus without complication  we will check A1c she was encouraged to eat healthy and stay physically active - Hemoglobin A1c - Microalbumin, urine  2. Hyperlipidemia  cholesterol needs to be checked in the past been looking good - Lipid panel  3. Abdominal pain, unspecified abdominal location  it was encouraged for her to get her medication I resected it to her pharmacy with a message that she never got the first one- Amitiza  The gastroenterologist on this  4. High risk medication use  Will need to check potassium and kidney functions   I believe the patient is also having stress-related symptoms I recommend Celexa 10 mg daily follow-up in 3 months at times patient is depressed about her son's health with diabetes plus she also has take care of other family members patient relates she is often stressed and sometimes overwhelmed - Basic metabolic panel - Hepatic function panel

## 2015-03-26 ENCOUNTER — Ambulatory Visit: Payer: Commercial Managed Care - HMO | Admitting: Family Medicine

## 2015-03-26 DIAGNOSIS — Z79899 Other long term (current) drug therapy: Secondary | ICD-10-CM | POA: Diagnosis not present

## 2015-03-26 DIAGNOSIS — E785 Hyperlipidemia, unspecified: Secondary | ICD-10-CM | POA: Diagnosis not present

## 2015-03-26 DIAGNOSIS — E119 Type 2 diabetes mellitus without complications: Secondary | ICD-10-CM | POA: Diagnosis not present

## 2015-03-27 LAB — BASIC METABOLIC PANEL
BUN/Creatinine Ratio: 21 (ref 11–26)
BUN: 18 mg/dL (ref 8–27)
CHLORIDE: 102 mmol/L (ref 97–108)
CO2: 20 mmol/L (ref 18–29)
Calcium: 9.3 mg/dL (ref 8.7–10.3)
Creatinine, Ser: 0.86 mg/dL (ref 0.57–1.00)
GFR calc Af Amer: 81 mL/min/{1.73_m2} (ref >59)
GFR calc non Af Amer: 70 mL/min/{1.73_m2} (ref >59)
Glucose: 95 mg/dL (ref 65–99)
POTASSIUM: 6.4 mmol/L — AB (ref 3.5–5.2)
Sodium: 142 mmol/L (ref 134–144)

## 2015-03-27 LAB — HEPATIC FUNCTION PANEL
ALT: 17 IU/L (ref 0–32)
AST: 30 IU/L (ref 0–40)
Albumin: 4.1 g/dL (ref 3.6–4.8)
Alkaline Phosphatase: 81 IU/L (ref 39–117)
Bilirubin Total: 0.4 mg/dL (ref 0.0–1.2)
Bilirubin, Direct: 0.06 mg/dL (ref 0.00–0.40)
TOTAL PROTEIN: 7.3 g/dL (ref 6.0–8.5)

## 2015-03-27 LAB — HEMOGLOBIN A1C
Est. average glucose Bld gHb Est-mCnc: 137 mg/dL
HEMOGLOBIN A1C: 6.4 % — AB (ref 4.8–5.6)

## 2015-03-27 LAB — LIPID PANEL
Chol/HDL Ratio: 2.9 ratio units (ref 0.0–4.4)
Cholesterol, Total: 145 mg/dL (ref 100–199)
HDL: 50 mg/dL (ref >39)
LDL CALC: 79 mg/dL (ref 0–99)
Triglycerides: 78 mg/dL (ref 0–149)
VLDL Cholesterol Cal: 16 mg/dL (ref 5–40)

## 2015-03-27 LAB — MICROALBUMIN, URINE

## 2015-04-01 ENCOUNTER — Telehealth: Payer: Self-pay | Admitting: Family Medicine

## 2015-04-01 ENCOUNTER — Other Ambulatory Visit: Payer: Self-pay | Admitting: *Deleted

## 2015-04-01 DIAGNOSIS — Z79899 Other long term (current) drug therapy: Secondary | ICD-10-CM

## 2015-04-01 DIAGNOSIS — E875 Hyperkalemia: Secondary | ICD-10-CM

## 2015-04-01 NOTE — Telephone Encounter (Signed)
Unfortunately diabetes is a very expensive illness. The cheapest medications are glipizide which she is on currently and metformin which she stated caused intestinal upset. If she cannot afford this knee medication/Tradjenta then the next step would be trying a higher dose of glipizide. Please talk with the patient to see what she would like to do. The other medicine that is very expensive is the medication for constipation that was prescribed by the gastroenterologist/as a courtesy to the patient we sent in refills the name of this medication is Amatiza. This particular medication is very expensive. The only other option I could think of would be taking in a larger dose of stool softeners such as MiraLAX. I would suggest that the patient talk with gastroenterology to see if they have anything else cheaper. The other thing that she can do is to call her insurance company to see if they have any recommendations.

## 2015-04-01 NOTE — Telephone Encounter (Signed)
Pt states the two scripts that you ordered for her will cost her over $500  linagliptin (TRADJENTA) 5 MG TABS tablet An her anti depressant you ordered this same day ( she didn't know  Which one that was)  She needs to know what you want her to use now or what you can reorder   [name]

## 2015-04-02 NOTE — Telephone Encounter (Signed)
To help guide this decision I would recommend for the next week that she check her sugar in the morning and before supper and either write it on a sheet of paper and bring to the office or call that to [place] in one week then we can make adjustments on her glipizide thank you

## 2015-04-02 NOTE — Telephone Encounter (Signed)
Notified patient to help guide this decision he would recommend for the next week that she check her sugar in the morning and before supper and either write it on a sheet of paper and bring to the office or call that to [place] in one week then we can make adjustments on her glipizide. Patient verbalized understanding.

## 2015-04-02 NOTE — Telephone Encounter (Signed)
Patient states that she wants to just increase the dosage of her glipizide.

## 2015-04-10 ENCOUNTER — Telehealth: Payer: Self-pay | Admitting: Family Medicine

## 2015-04-10 NOTE — Telephone Encounter (Signed)
Pt didn't want to drop her sugar reading so I am putting them in a message  For you to see as follows:   8/4    9:15am   116          5:50pm   100  8/5    7:17am   93          5:35pm   115  8/6    8:32am   131          7:30pm   126  8/7    7:54am   120           8:00pm   154  8/8    8:51am   113          6:25pm   147  8/9    9:00am   100          6:45pm   107  8/10  10:20am  90           7:00pm   84  8/11  8:00am    130

## 2015-04-10 NOTE — Telephone Encounter (Signed)
Clarify with pt glipizide, also clarify I believe she is not taking Tradjenta and amitiza due to cost if that is so then remove them from her epic med list please!

## 2015-04-10 NOTE — Telephone Encounter (Signed)
TCNA (vm box not setup) 

## 2015-04-15 ENCOUNTER — Other Ambulatory Visit: Payer: Self-pay | Admitting: Family Medicine

## 2015-04-17 ENCOUNTER — Other Ambulatory Visit: Payer: Self-pay

## 2015-04-17 ENCOUNTER — Telehealth: Payer: Self-pay | Admitting: Family Medicine

## 2015-04-17 DIAGNOSIS — E875 Hyperkalemia: Secondary | ICD-10-CM | POA: Diagnosis not present

## 2015-04-17 DIAGNOSIS — Z79899 Other long term (current) drug therapy: Secondary | ICD-10-CM | POA: Diagnosis not present

## 2015-04-17 NOTE — Telephone Encounter (Signed)
Patient is taking glipizide 5 mg 1/2 tab po BID. Patient is no longer taking Tradjenta or Amitiza.

## 2015-04-17 NOTE — Telephone Encounter (Signed)
Pt dropped off her sugar reading. Message in box.

## 2015-04-17 NOTE — Telephone Encounter (Signed)
I would recommend increasing glipizide use 5 mg tablet. At this time use 5 mg in the morning and one half of a 5 mg at supper. If any low sugar spells happen on a regular basis please notify us. Continue to eat healthy do not skip meals May send [place] more readings again in several weeks time. There is no need to do frequent sugar checks unless she feels it's getting low otherwise she could do 3-4 days in the morning and 3-4 days at suppertime per week. If she sees her suppertime readings are consistently low please call the office. Update Epic with the new dosage

## 2015-04-17 NOTE — Telephone Encounter (Signed)
Notified patient Dr. Nicki Reaper would recommend increasing glipizide use 5 mg tablet. At this time use 5 mg in the morning and one half of a 5 mg at supper. If any low sugar spells happen on a regular basis please notify us. Continue to eat healthy do not skip meals May send [place] more readings again in several weeks time. There is no need to do frequent sugar checks unless she feels it's getting low otherwise she could do 3-4 days in the morning and 3-4 days at suppertime per week. If she sees her suppertime readings are consistently low please call the office. Patient verbalized understanding.

## 2015-04-17 NOTE — Addendum Note (Signed)
Addended by: Jesusita Oka on: 04/17/2015 04:24 PM   Modules accepted: Orders, Medications

## 2015-04-17 NOTE — Telephone Encounter (Signed)
[facility] with husband

## 2015-04-17 NOTE — Telephone Encounter (Signed)
This was reviewed please see other message

## 2015-04-18 ENCOUNTER — Encounter: Payer: Self-pay | Admitting: Family Medicine

## 2015-04-18 LAB — BASIC METABOLIC PANEL
BUN/Creatinine Ratio: 20 (ref 11–26)
BUN: 17 mg/dL (ref 8–27)
CO2: 26 mmol/L (ref 18–29)
Calcium: 9.5 mg/dL (ref 8.7–10.3)
Chloride: 98 mmol/L (ref 97–108)
Creatinine, Ser: 0.83 mg/dL (ref 0.57–1.00)
GFR calc non Af Amer: 73 mL/min/{1.73_m2} (ref >59)
GFR, EST AFRICAN AMERICAN: 84 mL/min/{1.73_m2} (ref >59)
Glucose: 93 mg/dL (ref 65–99)
POTASSIUM: 4.4 mmol/L (ref 3.5–5.2)
Sodium: 137 mmol/L (ref 134–144)

## 2015-05-01 ENCOUNTER — Telehealth: Payer: Self-pay

## 2015-05-01 NOTE — Telephone Encounter (Signed)
[phone] PLEASE CALL PATIENT, HAS QUESTION ABOUT GOING TO THE BATHROOM.  TOOK A LAXATIVE AND NOW SHE IS CONSTIPATED.

## 2015-05-01 NOTE — Telephone Encounter (Signed)
I called pt. She said she took the Amitiza 24 mcg bid that she was given in the office, and it helped a lot. She could not afford it, too expensive. Stool softners did not help that much. So she took 3 Ducolax tablets at once and had a lot of cramping. She is taking metamucil daily now and added fiber to her diet.  However, she just cannot have a productive BM and it feels like a knot in her stomach. Please advise!

## 2015-05-06 ENCOUNTER — Ambulatory Visit (INDEPENDENT_AMBULATORY_CARE_PROVIDER_SITE_OTHER): Payer: Commercial Managed Care - HMO | Admitting: Nurse Practitioner

## 2015-05-06 ENCOUNTER — Encounter: Payer: Self-pay | Admitting: Nurse Practitioner

## 2015-05-06 VITALS — BP 145/73 | HR 47 | Temp 97.6°F | Ht 65.0 in | Wt 242.4 lb

## 2015-05-06 DIAGNOSIS — K5900 Constipation, unspecified: Secondary | ICD-10-CM | POA: Diagnosis not present

## 2015-05-06 NOTE — Patient Instructions (Signed)
1. We will send and Linzess 145 g free to take once a day. We'll give you samples for 2 weeks 2. Call us and let us know how her symptoms are doing on Linzess. Next line if you do well with that, we can send in a prescription to your pharmacy.  3. Follow-up in 3 months. 4. Have a great anniversary!!

## 2015-05-06 NOTE — Progress Notes (Signed)
Referring Provider: [name], MD Primary Care Physician:  [name], MD Primary GI:  Dr. Oneida Alar  Chief Complaint  Patient presents with  . Constipation    HPI:   69 year old female presents to follow up on constipation. Has tried MiraLAX in spite of supplementation without success. Was trialed on Amitiza which worked well, however she cannot afford it. Per telephone note she tried 3 Dulcolax tabs and wants to have a lot of cramping. She is taking Metamucil daily now and added fiber to her diet, however she still feels she is unable to have a productive bowel movement. Colonoscopy is up-to-date.  Today she states she is still intermittently constipated. Her stools are hard and she has to strain. Has good amount of fiber in her diet and adequate water intake. She had 2 days of significant cramping on dulcolax. Denies hematochezia, melena, fever, chills. Did have a couple episodes of nausea and vomiting when she was having cramping from the Dulcolax, but that resolved within 24 hours. Denies hemorrhoid symptoms. Denies chest pain, dyspnea, dizziness, lightheadedness, syncope, near syncope. Denies any other upper or lower GI symptoms.  Past Medical History  Diagnosis Date  . Hypertension   . Diverticulitis   . Cyst on liver  . Blood clot of artery under arm   . Diabetes mellitus     controlled by diet  . Menopause   . Fibrocystic breast disease   . Impaired glucose tolerance   . Hepatic cyst   . IBS (irritable bowel syndrome)   . DVT of axillary vein, acute right 11/24/2012    Dx on Jan 28, 2012.      Past Surgical History  Procedure Laterality Date  . Back surgery    . Cholecystectomy    . Knee arthrocentesis  left  . Breast surgery  right    cyst removed  . Abdominal hysterectomy    . Tubal ligation    . Cast application  7/86/7672    Procedure: CAST APPLICATION;  Surgeon: Carole Civil, MD;  Location: [facility];  Service: Orthopedics;  Laterality: Right;   procedure room   . Colonoscopy  12/04/2004    NUR:Few small diverticula at sigmoid colon/Small cecal polyp ablated   . Colonoscopy N/A 09/24/2014    SLF: small internal hemorrhoids    Current Outpatient Prescriptions  Medication Sig Dispense Refill  . acetaminophen (TYLENOL) 500 MG tablet Take 500 mg by mouth every 6 (six) hours as needed.    . Alcohol Swabs (B-D SINGLE USE SWABS REGULAR) PADS USE AS DIRECTED 100 each 3  . aspirin 81 MG tablet Take 81 mg by mouth daily.    . Biotin w/ Vitamins C & E (HAIR SKIN & NAILS GUMMIES PO) Take 1 each by mouth 2 (two) times daily.    . Cholecalciferol (VITAMIN D3 PO) Take 1,000 Units by mouth daily.    . citalopram (CELEXA) 10 MG tablet Take 1 tablet (10 mg total) by mouth daily. 90 tablet 1  . dicyclomine (BENTYL) 10 MG capsule Take 1 capsule (10 mg total) by mouth 3 (three) times daily as needed for spasms. 90 capsule 3  . glipiZIDE (GLUCOTROL) 5 MG tablet Take 1 tab po q am and 1/2 tab po suppertime    . hydrochlorothiazide (HYDRODIURIL) 25 MG tablet TAKE 1 TABLET EVERY DAY 90 tablet 0  . ketoconazole (NIZORAL) 2 % cream Apply 1 application topically 2 (two) times daily. 30 g 4  . Multiple Vitamins-Minerals (  MULTIVITAMIN WITH MINERALS) tablet Take 1 tablet by mouth daily.    [name] omeprazole (PRILOSEC) 20 MG capsule Take 1 capsule (20 mg total) by mouth daily as needed. (Patient taking differently: Take 20 mg by mouth daily. ) 90 capsule 3  . potassium chloride (K-DUR,KLOR-CON) 10 MEQ tablet TAKE 1 TABLET EVERY DAY 90 tablet 3  . pravastatin (PRAVACHOL) 20 MG tablet Take 1 tablet (20 mg total) by mouth daily. 90 tablet 1  . Probiotic Product (Trooper) CAPS Take by mouth daily.     No current facility-administered medications for this visit.    Allergies as of 05/06/2015 - Review Complete 05/06/2015  Allergen Reaction Noted  . Norvasc [amlodipine besylate]  11/21/2012  . Penicillins Hives   . Advair diskus [fluticasone-salmeterol]  Palpitations 11/21/2012  . Metformin and related  09/28/2013    Family History  Problem Relation Age of Onset  . Stroke Mother   . Heart attack Mother   . Cancer Sister   . Diabetes Sister   . Seizures Brother   . Diabetes Brother   . Heart disease Brother   . Colon cancer Neg Hx     Social History   Social History  . Marital Status: Married    Spouse Name: N/A  . Number of Children: N/A  . Years of Education: N/A   Occupational History  . Retired    Social History Main Topics  . Smoking status: Never Smoker   . Smokeless tobacco: Never Used  . Alcohol Use: No  . Drug Use: No  . Sexual Activity: Yes   Other Topics Concern  . None   Social History Narrative    Review of Systems: 10-point ROS negative except as per HPI.   Physical Exam: BP 145/73 mmHg  Pulse 47  Temp(Src) 97.6 F (36.4 C) (Oral)  Ht 5\' 5"  (1.651 m)  Wt 242 lb 6.4 oz (109.952 kg)  BMI 40.34 kg/m2 General:   Alert and oriented. Pleasant and cooperative. Well-nourished and well-developed.  Head:  Normocephalic and atraumatic. Eyes:  Without icterus, sclera clear and conjunctiva pink.  Ears:  Normal auditory acuity. Cardiovascular:  S1, S2 present without murmurs appreciated. Normal pulses noted. Extremities without clubbing or edema. Respiratory:  Clear to auscultation bilaterally. No wheezes, rales, or rhonchi. No distress.  Gastrointestinal:  +BS, soft, and non-distended. Mild lower abdominal TTP. No HSM noted. No guarding or rebound. No masses appreciated.  Rectal:  Deferred  Neurologic:  Alert and oriented x4;  grossly normal neurologically. Psych:  Alert and cooperative. Normal mood and affect. Heme/Lymph/Immune: No excessive bruising noted.    05/06/2015 9:38 AM

## 2015-05-07 NOTE — Telephone Encounter (Signed)
This was addressed at her office visit. See OV dated 05/06/15 for details.

## 2015-05-08 NOTE — Assessment & Plan Note (Signed)
69 year old female with continued constipation. Amitiza was trialed last time and worked well, however she cannot afford it with her co-pay. She tried over-the-counter Dulcolax which caused intestinal spasming and discomfort. No red flag/warning signs or symptoms. Today we'll trial her on Linzess 145 g daily, provide samples for 2 weeks. She will call us with the results of the medication and benefits effective we can send in prescription for further refills can also consider increasing the dose if it is not quite as effective as intended. Follow-up in 3 months.

## 2015-05-08 NOTE — Telephone Encounter (Signed)
Thank you :)

## 2015-05-08 NOTE — Progress Notes (Signed)
cc'ed to pcp °

## 2015-05-27 ENCOUNTER — Other Ambulatory Visit: Payer: Self-pay

## 2015-05-28 ENCOUNTER — Telehealth: Payer: Self-pay

## 2015-05-28 MED ORDER — LINACLOTIDE 145 MCG PO CAPS: 145.0000 ug | ORAL_CAPSULE | Freq: Every day | ORAL | Status: DC

## 2015-05-28 NOTE — Telephone Encounter (Signed)
Pt sent someone by in reference to her Linzess prescription. It was sent earlier today to [facility]. I gave 2 boxes of Linzess 145 mcg ( #8 tablets) til she can get the prescription from the mail order.

## 2015-06-06 ENCOUNTER — Encounter: Payer: Self-pay | Admitting: *Deleted

## 2015-06-06 NOTE — Progress Notes (Signed)
[place] Work  Clinical Social Work was referred by patient for assessment of psychosocial needs due to possible transportation concerns.  Clinical Social Worker contacted patient via phone and she reports to plan to have her husband take her to her upcoming appointments. No other needs noted.   Clinical Social Work interventions: Resource education Loren Racer, [place] Worker [place]  [place] Phone: [phone] Fax: [phone]

## 2015-06-10 ENCOUNTER — Telehealth: Payer: Self-pay | Admitting: Gastroenterology

## 2015-06-10 NOTE — Telephone Encounter (Signed)
I called pt and gave her 2 boxes of Linzess 145 mcg. I also asked her to call [facility] and ask about the Rx that was sent in since it was sent in on 05/28/2015 and she said that she would.

## 2015-06-10 NOTE — Telephone Encounter (Signed)
FYI for the samples to Walden Field, NP who saw her in office last.

## 2015-06-10 NOTE — Telephone Encounter (Signed)
Patient called back again this afternoon to tell us that she had called her insurance company [facility]) and they said that we needed to call them and let them know why she needs Linzess.  I told the patient that the nurse said that she needed to call the insurance company back and have them fax [place] whatever papers they need for [place] to fill out and we did send in prescription with 3 refills and if they needed anything else they should have called our office. Patient is aggravated that she can't get her refills without having to do this every time and she ends up without her medication. I told her that we have samples for her up front and she will come by tomorrow to pick them up. I told her again to call her insurance company, but she said she isn't calling them that she has already called them and they want [place] to call with the information. She said their number was [phone]

## 2015-06-10 NOTE — Telephone Encounter (Signed)
Patient called to see if we had called in her prescription of Linzess. I told her she had 3 refills on Linzess and it was called in on Sept. 28 to [facility] mail order. She said she hasn't received anything from them and she was out of her medicine and could she get samples. Please advise and call her at [phone] and she said to leave message if she doesn't get to answer.

## 2015-06-11 NOTE — Telephone Encounter (Signed)
Noted  

## 2015-06-13 ENCOUNTER — Encounter (HOSPITAL_COMMUNITY): Payer: Commercial Managed Care - HMO | Attending: Hematology & Oncology | Admitting: Hematology & Oncology

## 2015-06-13 VITALS — BP 133/62 | HR 60 | Temp 97.7°F | Resp 18 | Wt 246.0 lb

## 2015-06-13 DIAGNOSIS — Z86718 Personal history of other venous thrombosis and embolism: Secondary | ICD-10-CM

## 2015-06-13 DIAGNOSIS — D563 Thalassemia minor: Secondary | ICD-10-CM

## 2015-06-13 DIAGNOSIS — M25471 Effusion, right ankle: Secondary | ICD-10-CM | POA: Diagnosis not present

## 2015-06-13 DIAGNOSIS — I808 Phlebitis and thrombophlebitis of other sites: Secondary | ICD-10-CM | POA: Diagnosis not present

## 2015-06-13 DIAGNOSIS — I82A11 Acute embolism and thrombosis of right axillary vein: Secondary | ICD-10-CM

## 2015-06-13 DIAGNOSIS — Z23 Encounter for immunization: Secondary | ICD-10-CM | POA: Diagnosis not present

## 2015-06-13 DIAGNOSIS — M25472 Effusion, left ankle: Secondary | ICD-10-CM | POA: Diagnosis not present

## 2015-06-13 LAB — D-DIMER, QUANTITATIVE (NOT AT ARMC): D DIMER QUANT: 0.5 ug{FEU}/mL — AB (ref 0.00–0.48)

## 2015-06-13 MED ORDER — INFLUENZA VAC SPLIT QUAD 0.5 ML IM SUSY
0.5000 mL | PREFILLED_SYRINGE | Freq: Once | INTRAMUSCULAR | Status: AC
  Administered 2015-06-13: 0.5 mL via INTRAMUSCULAR
  Filled 2015-06-13: qty 0.5

## 2015-06-13 NOTE — Progress Notes (Signed)
[name] presents today for injection per MD orders. Fluarix administered IM in left Upper Arm. Administration without incident. Patient tolerated well. [name] presented for labwork. Labs per MD order drawn via Peripheral Line 23 gauge needle inserted in left antecubital.  Good blood return present. Procedure without incident.  Needle removed intact. Patient tolerated procedure well.

## 2015-06-13 NOTE — Progress Notes (Signed)
Kathleen Lange, MD [name] [address]  DVT of axillary vein, acute right - Plan: Influenza vac split quadrivalent PF (FLUARIX) injection 0.5 mL, D-dimer, quantitative, D-dimer, quantitative  CURRENT THERAPY: Surveillance  INTERVAL HISTORY: [name] 69 y.o. female returns for followup of DVT of the right axillary vein in the setting of Coumadin anticoagulation, typically therapeutic. She is S/P 12 months of anticoagulation with xarelto, finishing on 03/12/2013.   [name] is here today with her husband. She says she's been doing good, but at times she still has problems with her arm where it hurts. But other than that, she says she has no major issues.  She says she feels good sometimes, and that she gets mammograms every year, and received her colonoscopy.  She has had a hysterectomy. She is up to date on her well-care.  Her PCP is [name]. He follows her cholesterol and "everything else."  Today [name] complains that her right arm is getting bigger, hurts and aches. She says it does this often, but she just goes on.  When she had her first blood clot, she hadn't been in the hospital or had an IV or anything; she just noticed it. She says she was outside and thought something had bit her. She said it turned blue, and was "like an egg." She decided to go to the doctor because it hurt and was unusual.  She was put on Coumadin, and at that time she was going every week to check on her blood. Then she broke both of her ankles at the same time. She had to go into a nursing home for recovery from this, and had to have physical therapy. She stayed there for 2 weeks, then came home and had 6 weeks of physical therapy. She had no blood clots in her legs at that time. She says she wasn't even active for an entire year.  She was switched to Xarelto to help keep her from going to get the INR's. She had X-rays again last year and  didn't have any more blood clots.  [name] denies any family history of DVT. She used to take birth control pills, and never had any problems with those.  She says she doesn't really have free time because she spends most of her time taking care of other people.  She has thirteen siblings; she's the youngest of 21. She has 3 children and 2 grandchildren. Ten of her siblings are still living.  [name] reports a lot of swelling in her ankles, and sometimes she can't get her shoe on. She says it swells at all times, and doesn't go down. She states it's been like that for a while. She says her left ankle is worse than the right, "it's like a knot or something." She does note that this started after her ankle fractures.   Past Medical History  Diagnosis Date  . Hypertension   . Diverticulitis   . Cyst on liver  . Blood clot of artery under arm   . Diabetes mellitus     controlled by diet  . Menopause   . Fibrocystic breast disease   . Impaired glucose tolerance   . Hepatic cyst   . IBS (irritable bowel syndrome)   . DVT of axillary vein, acute right 11/24/2012    Dx on Jan 28, 2012.      has HYPERCHOLESTEROLEMIA; DEPRESSION; HYPERTENSION; PEPTIC ULCER DISEASE; HIATAL HERNIA;  HEPATIC CYST; OSTEOARTHRITIS; ABDOMINAL PAIN, CHRONIC; Chest pain, unspecified; Bradycardia; Giddiness; Diabetes mellitus type 2, diet-controlled ([place]); DVT of axillary vein, acute right; Diabetes type 2, uncontrolled ([place]); Abdominal pain; Decreased appetite; Unintentional weight loss; [facility] (abdominal pain); Loss of weight; and Constipation on her problem list.     is allergic to norvasc; penicillins; advair diskus; and metformin and related.  [name] does not currently have medications on file.  Past Surgical History  Procedure Laterality Date  . Back surgery    . Cholecystectomy    . Knee arthrocentesis  left  . Breast surgery  right    cyst removed  . Abdominal hysterectomy    .  Tubal ligation    . Cast application  11/12/1759    Procedure: CAST APPLICATION;  Surgeon: Carole Civil, MD;  Location: [facility];  Service: Orthopedics;  Laterality: Right;  procedure room   . Colonoscopy  12/04/2004    NUR:Few small diverticula at sigmoid colon/Small cecal polyp ablated   . Colonoscopy N/A 09/24/2014    SLF: small internal hemorrhoids    Denies any headaches, dizziness, double vision, fevers, chills, night sweats, nausea, vomiting, diarrhea, constipation, chest pain, heart palpitations, shortness of breath, blood in stool, black tarry stool, urinary pain, urinary burning, urinary frequency, hematuria.  14 point review of systems was performed and is negative except as detailed under history of present illness and above  PHYSICAL EXAMINATION  ECOG PERFORMANCE STATUS: 1 - Symptomatic but completely ambulatory  Filed Vitals:   06/13/15 1234  BP: 133/62  Pulse: 60  Temp: 97.7 F (36.5 C)  Resp: 18    GENERAL:alert, no distress, well nourished, well developed, comfortable, cooperative, obese and smiling SKIN: skin color, texture, turgor are normal, no rashes or significant lesions HEAD: Normocephalic, No masses, lesions, tenderness or abnormalities EYES: normal, PERRLA, EOMI, Conjunctiva are pink and non-injected EARS: External ears normal OROPHARYNX:lips, buccal mucosa, and tongue normal and mucous membranes are moist  NECK: supple, no adenopathy, thyroid normal size, non-tender, without nodularity, no stridor, non-tender, trachea midline LYMPH:  no palpable lymphadenopathy BREAST:not examined LUNGS: clear to auscultation  HEART: regular rate & rhythm, no murmurs and no gallops ABDOMEN:abdomen soft, non-tender, obese and normal bowel sounds BACK: Back symmetric, no curvature., No CVA tenderness EXTREMITIES:less then 2 second capillary refill, no skin discoloration, no clubbing, no cyanosis, positive findings:  Right upper right extremity proximal to olecranon  mild edema with some minimal tenderness to palpation on anterior aspect of deltoid without erythema or heat.  NEURO: alert & oriented x 3 with fluent speech, no focal motor/sensory deficits, gait normal  RADIOLOGY:   *RADIOLOGY REPORT*  Clinical Data: Right upper extremity DVT  RIGHT UPPER EXTREMITY VENOUS DUPLEX ULTRASOUND  Technique: Gray-scale sonography with graded compression, as well as color Doppler and duplex ultrasound were performed to evaluate the upper extremity deep venous system from the level of the subclavian vein and including the jugular, axillary, basilic and upper cephalic vein. Spectral Doppler was utilized to evaluate flow at rest and with distal augmentation maneuvers.  Comparison: 01/28/2012  Findings: The right internal jugular vein remains widely patent. Right subclavian vein demonstrates a new echogenic intraluminal thrombus appearing occlusive and noncompressible compatible with interval development of right subclavian DVT. Right axillary nonocclusive DVT demonstrates interval near complete resolution. Peripherally, the right cephalic, basilic, brachial, radial and ulnar veins are all patent with normal compressibility and phasicity. Augmentation not performed.  IMPRESSION: Interval near complete resolution of the right axillary nonocclusive DVT  Unfortunately,  there has been development of new thrombus in the right subclavian vein more centrally which appears occlusive.   Original Report Authenticated By: Jerilynn Mages. Shick, M.D      LABORATORY DATA: I have reviewed the data as listed  CBC    Component Value Date/Time   WBC 6.1 12/12/2014 0949   RBC 5.51* 12/12/2014 0949   HGB 13.2 12/12/2014 0949   HCT 41.3 12/12/2014 0949   PLT 311 12/12/2014 0949   MCV 75.0* 12/12/2014 0949   MCH 24.0* 12/12/2014 0949   MCHC 32.0 12/12/2014 0949   RDW 16.8* 12/12/2014 0949   LYMPHSABS 3.1 12/12/2014 0949   MONOABS 0.5 12/12/2014 0949    EOSABS 0.2 12/12/2014 0949   BASOSABS 0.0 12/12/2014 0949      Chemistry      Component Value Date/Time   NA 137 04/17/2015 0856   NA 141 09/12/2014 1111   K 4.4 04/17/2015 0856   CL 98 04/17/2015 0856   CO2 26 04/17/2015 0856   BUN 17 04/17/2015 0856   BUN 20 09/12/2014 1111   CREATININE 0.83 04/17/2015 0856   CREATININE 0.79 06/19/2014 0833      Component Value Date/Time   CALCIUM 9.5 04/17/2015 0856   ALKPHOS 81 03/26/2015 0811   AST 30 03/26/2015 0811   ALT 17 03/26/2015 0811   BILITOT 0.4 03/26/2015 0811   BILITOT 0.4 09/26/2014 1019     Lab Results  Component Value Date   DDIMER 0.46 12/12/2014       ASSESSMENT AND PLAN:  History of DVT RUE Beta Thalassemia Trait   Continue surveillance.  She is probably appropriate for discharge from the [facility] if no problems when she returns in 6 months.    With regards to her ankle swelling, I recommend she wears compression stockings. I advised Mrs. Matthew that her swelling is probably a chronic post-injury result of her broken ankles. The swelling is gone in the am and worse in the evening. It may also be secondary to venous insufficiency.  I will order a D-dimer test for her today. She worries a lot about recurrent blood clots. I reassured her.  She was seen at [facility] by Dr. Joan Flores:  Venous thromboembolism: First episode, reportedly "mild" right arm axillary DVT diagnosed 01/26/2012 (report available to me) after more than 1 year (probably > 2 yrs) of symptoms that had worsened for the preceding few weeks in 12-2011. VTE risk factors:(a) obesity. Nothing else. Thrombophilia workup negative. On warfarin thereafter until on 11/28/2012 when a second episode of VTE was reportedly diagnosed-no new symptoms at that time: Reportedly new Right subclavian vein DVT (report available to me), but almost complete resolution of the axillary vein clot. Switched from warfarin to Xarelto at that time. It is somewhat surprising that the patient  would have developed a new arm DVT (right subclavian vein) while on full dose anticoagulation and without any worsening of symptoms, and also that she would have dissolved the other clot at the same time. Thus, I wonder whether the reading of the ultrasound was discrepant due to different technique or technologist. On Xarelto until June 2014- Negative d-dimer led to discontinuation of Xarelto. Negative d-dimer today suggests low wish risk for recurrent VTE.  The Doppler ultrasound at [facility] on 02/06/2013 clearly shows evidence of old axillary vein DVT; the venogram however shows completely patent venous system in the right arm. I conclude that the patient has had a right axillary DVT at some point in the past, approximately 3 years  ago. By history this appears to have been an unprovoked event. I am not convinced that she had a recurrence on 11/28/2012 and am rather doubtful that she did, as she had no new symptoms in April 2014, and as she is obese which decreases the sensitivity of Doppler ultrasound to detect new clot and increases the false positive rate.   PLAN 1. Continue without blood thinners. 2. No follow-up with me was arranged  Odis Hollingshead, MD Professor Department of Medicine, Division of Hematology-Oncology [facility] [address] [place], [place] [address] [email or web address]   Orders Placed This Encounter  Procedures  . D-dimer, quantitative  . D-dimer, quantitative    Standing Status: Future     Number of Occurrences:      Standing Expiration Date: 06/12/2016   All questions were answered. The patient knows to call the clinic with any problems, questions or concerns. We can certainly see the patient much sooner if necessary.  This document serves as a record of services personally performed by Ancil Linsey, MD. It was created on her behalf by Toni Amend, a trained medical scribe. The creation of this record is based on the scribe's personal  observations and the provider's statements to them. This document has been checked and approved by the attending provider.  I have reviewed the above documentation for accuracy and completeness, and I agree with the above.  This note is electronically signed by: Molli Hazard, MD  06/13/2015 3:27 PM

## 2015-06-13 NOTE — Patient Instructions (Signed)
[place] at [facility] Discharge Instructions  RECOMMENDATIONS MADE BY THE CONSULTANT AND ANY TEST RESULTS WILL BE SENT TO YOUR REFERRING PHYSICIAN.  Exam per Dr.Penland. Lab work today. We will call if there are any unexpected/abnormal results. Return in 6 months for follow up.  Thank you for choosing [place] at [facility] to provide your oncology and hematology care.  To afford each patient quality time with our provider, please arrive at least 15 minutes before your scheduled appointment time.    You need to re-schedule your appointment should you arrive 10 or more minutes late.  We strive to give you quality time with our providers, and arriving late affects you and other patients whose appointments are after yours.  Also, if you no show three or more times for appointments you may be dismissed from the clinic at the providers discretion.     Again, thank you for choosing [facility].  Our hope is that these requests will decrease the amount of time that you wait before being seen by our physicians.       _____________________________________________________________  Should you have questions after your visit to [facility], please contact our office at [phone] between the hours of 8:30 a.m. and 4:30 p.m.  Voicemails left after 4:30 p.m. will not be returned until the following business day.  For prescription refill requests, have your pharmacy contact our office.

## 2015-06-16 ENCOUNTER — Other Ambulatory Visit: Payer: Self-pay | Admitting: Family Medicine

## 2015-06-17 ENCOUNTER — Encounter (HOSPITAL_COMMUNITY): Payer: Self-pay | Admitting: Hematology & Oncology

## 2015-06-19 NOTE — Telephone Encounter (Signed)
Tried to do a PA for this and it came back saying a PA was not needed. [facility], I spoke with 2 different people, was on hold for 23 minutes and got disconnected as I was being transferred to a 3rd person to talk to. They could not tell me why it had been denied at the pharmacy. I will try to call back.

## 2015-06-25 ENCOUNTER — Ambulatory Visit (INDEPENDENT_AMBULATORY_CARE_PROVIDER_SITE_OTHER): Payer: Commercial Managed Care - HMO | Admitting: Family Medicine

## 2015-06-25 ENCOUNTER — Encounter: Payer: Self-pay | Admitting: Family Medicine

## 2015-06-25 VITALS — BP 150/80 | Ht 65.0 in | Wt 247.4 lb

## 2015-06-25 DIAGNOSIS — I10 Essential (primary) hypertension: Secondary | ICD-10-CM | POA: Diagnosis not present

## 2015-06-25 DIAGNOSIS — E119 Type 2 diabetes mellitus without complications: Secondary | ICD-10-CM | POA: Diagnosis not present

## 2015-06-25 LAB — POCT GLYCOSYLATED HEMOGLOBIN (HGB A1C): Hemoglobin A1C: 6.8

## 2015-06-25 MED ORDER — LOSARTAN POTASSIUM 50 MG PO TABS: 50.0000 mg | ORAL_TABLET | Freq: Every day | ORAL | Status: DC

## 2015-06-25 MED ORDER — PRAVASTATIN SODIUM 20 MG PO TABS: 20.0000 mg | ORAL_TABLET | Freq: Every day | ORAL | Status: DC

## 2015-06-25 MED ORDER — GLIPIZIDE 5 MG PO TABS: ORAL_TABLET | ORAL | Status: DC

## 2015-06-25 NOTE — Progress Notes (Signed)
   Subjective:    Patient ID: [name], female    DOB: 03-19-1946, 70 y.o.   MRN: [id number]  Diabetes She presents for her follow-up diabetic visit. She has type 2 diabetes mellitus. No MedicAlert identification noted. Pertinent negatives for hypoglycemia include no confusion. Pertinent negatives for diabetes include no chest pain, no fatigue, no polydipsia, no polyphagia and no weakness. She has not had a previous visit with a dietitian. She does not see a podiatrist.Eye exam is not current.   Patient would like to discuss weight loss, patient needs refill on Glipizide and would like to discuss Linzess.  Hemglobin A1c today is: 6.8 Patient's blood pressure higher than what I like to see it. Patient's potassium slightly elevated on previous lab work repeat metabolic 7 Review of Systems  Constitutional: Negative for activity change, appetite change and fatigue.  HENT: Negative for congestion.   Respiratory: Negative for cough.   Cardiovascular: Negative for chest pain.  Gastrointestinal: Negative for abdominal pain.  Endocrine: Negative for polydipsia and polyphagia.  Neurological: Positive for numbness. Negative for weakness.  Psychiatric/Behavioral: Negative for confusion.       Objective:   Physical Exam  Constitutional: She appears well-nourished. No distress.  Cardiovascular: Normal rate, regular rhythm and normal heart sounds.   No murmur heard. Pulmonary/Chest: Effort normal and breath sounds normal. No respiratory distress.  Musculoskeletal: She exhibits no edema.  Lymphadenopathy:    She has no cervical adenopathy.  Neurological: She is alert. She exhibits normal muscle tone.  Psychiatric: Her behavior is normal.  Vitals reviewed. foot exam normal  25 minutes was spent with the patient. Greater than half the time was spent in discussion and answering questions and counseling regarding the issues that the patient came in for today.  Patient is concerned  about her weight I share her concern but I do not recommend medication for weight loss because of her health issues     Assessment & Plan:  diabetes-patient states she has not been doing a good job watching how she eats. I told her that it's very important for her to check her sugars couple times on a frequent basis over the next few weeks send me the readings. May need to adjust her medications.  HTN add losartan. Continue HCTZ. Follow-up several weeks for wellness exam recheck blood pressure at that timepatient instructed to stop potassium supplements when she gets her losartan.  Chronic constipation. Saw specialist they recommended Linzess, she has had a difficult time getting this medication. I instructed her to call the gastroenterologist office so that they can help get prior approval for this. The patient states that she called her insurance company was told by [employer] that the doctor would have to call  Right arm pain-patient recently saw Dr. Whitney Muse. They told her that they would do d-dimer and let her know the results. Patient states she has not heard of the results at this point. D-dimer slightly elevated. We will have hematology let the patient know if she needs Doppler studies or other testing.

## 2015-06-26 ENCOUNTER — Encounter: Payer: Self-pay | Admitting: Family Medicine

## 2015-06-26 LAB — BASIC METABOLIC PANEL
BUN/Creatinine Ratio: 26 (ref 11–26)
BUN: 19 mg/dL (ref 8–27)
CALCIUM: 9.8 mg/dL (ref 8.7–10.3)
CHLORIDE: 100 mmol/L (ref 97–106)
CO2: 23 mmol/L (ref 18–29)
Creatinine, Ser: 0.73 mg/dL (ref 0.57–1.00)
GFR, EST AFRICAN AMERICAN: 99 mL/min/{1.73_m2} (ref >59)
GFR, EST NON AFRICAN AMERICAN: 85 mL/min/{1.73_m2} (ref >59)
Glucose: 90 mg/dL (ref 65–99)
POTASSIUM: 4.6 mmol/L (ref 3.5–5.2)
Sodium: 142 mmol/L (ref 136–144)

## 2015-07-02 ENCOUNTER — Ambulatory Visit: Payer: Commercial Managed Care - HMO

## 2015-07-02 VITALS — BP 158/80

## 2015-07-02 DIAGNOSIS — Z0130 Encounter for examination of blood pressure without abnormal findings: Secondary | ICD-10-CM

## 2015-07-02 MED ORDER — LOSARTAN POTASSIUM 50 MG PO TABS: 50.0000 mg | ORAL_TABLET | Freq: Every day | ORAL | Status: DC

## 2015-07-02 NOTE — Progress Notes (Signed)
   Subjective:    Patient ID: [name], female    DOB: April 05, 1946, 69 y.o.   MRN: [id number]  HPI Patient in today for nurse visit to check BP. Blood pressure was 158/80. Patient verbalized that she has not been taking Losartan due to not receiving it in the mail. Discussed with Dr.Scott Luking in real time. Was instructed to send 30 day supply to local pharmacy and have patient come back in a week for another nurse visit to have BP checked while on medication. Patient verbalized understanding   Review of Systems     Objective:   Physical Exam        Assessment & Plan:

## 2015-07-03 ENCOUNTER — Telehealth: Payer: Self-pay | Admitting: Family Medicine

## 2015-07-03 NOTE — Telephone Encounter (Signed)
Pt was seen yesterday for a bp check. Pt states that after taking the bp meds her bp is 128/54 with a hr of 59. Pt wants to know if that is where it should be. Please advise.

## 2015-07-03 NOTE — Telephone Encounter (Signed)
Needs to give more than one or two don med f u as sched

## 2015-07-03 NOTE — Telephone Encounter (Signed)
Discussed with pt

## 2015-07-03 NOTE — Telephone Encounter (Signed)
Pt came in yesterday for a nurse visit for a bp check. Pt had not been taking bp med because she states it was not sent to pharm. Started losartan 50mg  last night and took one today. She felt sluggish and drained, no energy. Went to [facility] and had bp taken.

## 2015-07-08 NOTE — Telephone Encounter (Signed)
Finally talked to [facility], pt is in the "doughnut hole" and they are going to charge her $265.75 for it. They need her approval prior to shipping it to her. They have not been able to get in touch with her. Tried to call pt- NA

## 2015-07-09 ENCOUNTER — Ambulatory Visit: Payer: Commercial Managed Care - HMO | Admitting: *Deleted

## 2015-07-09 VITALS — BP 128/78

## 2015-07-09 DIAGNOSIS — I10 Essential (primary) hypertension: Secondary | ICD-10-CM

## 2015-07-09 NOTE — Progress Notes (Signed)
   Subjective:    Patient ID: [name], female    DOB: Oct 13, 1945, 69 y.o.   MRN: [id number]  HPI  Patient arrives for a blood pressure check. Patient states that the losartin 50mg  is making her feel drained. BP today is 128/78. Discussed with Dr [name]- Blood pressure looks great, Patient may take half a pill of losartin for now and keep follow up appt next week with Dr Nicki Reaper. Discussed with patient. Patient verbalized understanding.  Review of Systems     Objective:   Physical Exam        Assessment & Plan:

## 2015-07-10 NOTE — Telephone Encounter (Signed)
Tried to call pt- NA 

## 2015-07-14 ENCOUNTER — Telehealth: Payer: Self-pay | Admitting: Family Medicine

## 2015-07-14 NOTE — Telephone Encounter (Signed)
Patient wanted you to know that her blood pressure for today is 140/43 and pulse 62. She had to cancel her appointment for tomorrow because husband is in hospital at [facility]. She is still taking a whole blood pressure pill instead of half and she is having to nurse in the hospital taking her blood pressure reading while her husband is still their.

## 2015-07-14 NOTE — Telephone Encounter (Signed)
Letter mailed to the pt. 

## 2015-07-14 NOTE — Telephone Encounter (Signed)
Spoke with patient and informed her per Dr.Scott Luking-Patient should take a whole tablet of losartan. Also hydrochlorothiazide each morning as well watch diet closely reschedule follow-up once husband out of hospital. Patient verbalized understanding and stated that she is currently taking one whole tablet and hctz.

## 2015-07-14 NOTE — Telephone Encounter (Signed)
Patient should take a whole tablet of losartan. Also hydrochlorothiazide each morning as well watch diet closely reschedule follow-up once husband out of hospital

## 2015-07-15 ENCOUNTER — Encounter: Payer: Commercial Managed Care - HMO | Admitting: Family Medicine

## 2015-07-16 ENCOUNTER — Encounter: Payer: Self-pay | Admitting: Family Medicine

## 2015-07-16 ENCOUNTER — Ambulatory Visit (INDEPENDENT_AMBULATORY_CARE_PROVIDER_SITE_OTHER): Payer: Commercial Managed Care - HMO | Admitting: Family Medicine

## 2015-07-16 VITALS — BP 130/68 | Ht 65.0 in | Wt 247.0 lb

## 2015-07-16 DIAGNOSIS — I951 Orthostatic hypotension: Secondary | ICD-10-CM | POA: Diagnosis not present

## 2015-07-16 DIAGNOSIS — I10 Essential (primary) hypertension: Secondary | ICD-10-CM | POA: Diagnosis not present

## 2015-07-16 MED ORDER — HYDROCHLOROTHIAZIDE 12.5 MG PO TABS: 12.5000 mg | ORAL_TABLET | Freq: Every day | ORAL | Status: DC

## 2015-07-16 NOTE — Progress Notes (Signed)
   Subjective:    Patient ID: [name], female    DOB: 28-Aug-1946, 69 y.o.   MRN: [id number]  Hypertension This is a chronic problem. The current episode started more than 1 year ago. The problem is uncontrolled. There are no associated agents to hypertension. There are no known risk factors for coronary artery disease. Treatments tried: losartan. The current treatment provides mild improvement. There are no compliance problems.    Patient states that she has headaches, fatigue and doesn't rest.  patient is been very stressed recently having to take care of her sick husband present hospital as well as her sick sun  Patient states that she feels like she is going to pass out. Patient has no energy.   Review of Systems     She denies chest pressure tightness pain shortness of breath relates intermittent headaches fatigue and tiredness Objective:   Physical Exam  Constitutional: She appears well-nourished. No distress.  Cardiovascular: Normal rate, regular rhythm and normal heart sounds.   No murmur heard. Pulmonary/Chest: Effort normal and breath sounds normal. No respiratory distress.  Musculoskeletal: She exhibits no edema.  Lymphadenopathy:    She has no cervical adenopathy.  Neurological: She is alert. She exhibits normal muscle tone.  Psychiatric: Her behavior is normal.  Vitals reviewed.   blood pressure checked laying sitting standing significant drop in diastolic pressure       Assessment & Plan:   orthostasis-recommend decreasing HCTZ use 12.5 mg. Continue other doses of other medicines. Watch diet   Hypertension decent control overall   I feel the patient's headache is related to the fact that stress I do not feel the patient needs any type of intervention currently. Hopefully she will get more rest and the headache will go away she will let us know if any problems

## 2015-07-31 ENCOUNTER — Other Ambulatory Visit: Payer: Self-pay | Admitting: Licensed Clinical Social Worker

## 2015-07-31 ENCOUNTER — Ambulatory Visit (INDEPENDENT_AMBULATORY_CARE_PROVIDER_SITE_OTHER): Payer: Commercial Managed Care - HMO | Admitting: Family Medicine

## 2015-07-31 ENCOUNTER — Telehealth: Payer: Self-pay

## 2015-07-31 ENCOUNTER — Encounter: Payer: Self-pay | Admitting: Licensed Clinical Social Worker

## 2015-07-31 VITALS — BP 122/78 | Ht 65.0 in | Wt 249.9 lb

## 2015-07-31 DIAGNOSIS — Z0000 Encounter for general adult medical examination without abnormal findings: Secondary | ICD-10-CM | POA: Diagnosis not present

## 2015-07-31 DIAGNOSIS — K5900 Constipation, unspecified: Secondary | ICD-10-CM | POA: Diagnosis not present

## 2015-07-31 DIAGNOSIS — E119 Type 2 diabetes mellitus without complications: Secondary | ICD-10-CM | POA: Diagnosis not present

## 2015-07-31 DIAGNOSIS — I10 Essential (primary) hypertension: Secondary | ICD-10-CM | POA: Diagnosis not present

## 2015-07-31 NOTE — Telephone Encounter (Signed)
Pt came by for samples of Linzess 145 mcg. She is in donut hole until January.  I gave her #12 today.

## 2015-07-31 NOTE — Progress Notes (Signed)
   Subjective:    Patient ID: [name], female    DOB: 03-21-1946, 69 y.o.   MRN: [id number]  HPI  AWV- Annual Wellness Visit  The patient was seen for their annual wellness visit. The patient's past medical history, surgical history, and family history were reviewed. Pertinent vaccines were reviewed ( tetanus, pneumonia, shingles, flu) The patient's medication list was reviewed and updated.  The height and weight were entered. The patient's current BMI is:41.5   Cognitive screening was completed. Outcome of Mini - Cog: pass  Falls within the past 6 months:none  Current tobacco usage: no (All patients who use tobacco were given written and verbal information on quitting)  Recent listing of emergency department/hospitalizations over the past year were reviewed.  current specialist the patient sees on a regular basis:    Medicare annual wellness visit patient questionnaire was reviewed.  A written screening schedule for the patient for the next 5-10 years was given. Appropriate discussion of followup regarding next visit was discussed.  Left breast pain- UTD on mammo-this year nl- Patient has diabetes readings. Left shoulder pain and continued problems with constipation.      Review of Systems  Constitutional: Negative for activity change, appetite change and fatigue.  HENT: Negative for congestion.   Respiratory: Negative for cough.   Cardiovascular: Negative for chest pain.  Gastrointestinal: Negative for abdominal pain.  Endocrine: Negative for polydipsia and polyphagia.  Genitourinary: Negative for difficulty urinating.  Musculoskeletal: Positive for arthralgias. Negative for back pain.  Neurological: Negative for weakness.  Psychiatric/Behavioral: Negative for behavioral problems and confusion.       Objective:   Physical Exam  Constitutional: She is oriented to person, place, and time. She appears well-developed and well-nourished.  HENT:  Head:  Normocephalic.  Right Ear: External ear normal.  Left Ear: External ear normal.  Eyes: Pupils are equal, round, and reactive to light.  Neck: Normal range of motion. No thyromegaly present.  Cardiovascular: Normal rate, regular rhythm, normal heart sounds and intact distal pulses.   No murmur heard. Pulmonary/Chest: Effort normal and breath sounds normal. No respiratory distress. She has no wheezes.  Abdominal: Soft. Bowel sounds are normal. She exhibits no distension and no mass. There is no tenderness.  Musculoskeletal: Normal range of motion. She exhibits no edema or tenderness.  Lymphadenopathy:    She has no cervical adenopathy.  Neurological: She is alert and oriented to person, place, and time. She exhibits normal muscle tone.  Skin: Skin is warm and dry.  Psychiatric: She has a normal mood and affect. Her behavior is normal.  bilateral breast exam normal. No need for any type of intervention patient up-to-date on mammogram  Patient with constipation issues cannot afford medication she will see GI to see if they give her samples      Assessment & Plan:  Patient up-to-date on colonoscopy until 2021 Mammogram next spring Flu vaccine givenearlier this year Breast exam normal Diabetes decent control continue current measures glucose is reviewed Blood pressure laying sitting standing fine blood pressure good Patient was seen today due to patient complaints of breast pain diabetes and hypertension in addition to wellness exam

## 2015-07-31 NOTE — Telephone Encounter (Signed)
REVIEWED-NO ADDITIONAL RECOMMENDATIONS. 

## 2015-07-31 NOTE — Patient Outreach (Signed)
Assessment:  CSW received referral on Kathleen S. [name] on 07/31/15.  CSW completed chart review on [name] on 07/31/15.  CSW called [name] on 07/31/15 and spoke via phone with [name] on 07/31/15. CSW verified identity of [name]. [name]. CSW introduced self to [name].  CSW received verbal permission from [name] on 07/31/15 to speak with her about her current needs.  CSW informed [name] that she had been referred to [facility] program support for pharmacy, nursing (telephonic) and social work support. CSW reminded [name] that since she was a [name] of [name] that there would be no charge to her for [facility] program support.  CSW spoke with [name] about [facility] consent form and that [name] consent form would be mailed to her for her to read and review.  She said she would review [name] consent form and complete form and return [name] consent form to [name] office in [name], [name]. [name] said she has financial challenges.  She said her husband also has medical needs currently.  CSW and [name] spoke of care plan for [name]. She said she would try to attend scheduled medical appointments for [name] in next 30 days.   [name] said she saw [name] on 07/31/15.  She will have another medical appointment with [name] in January 2017.  She said she is having problems paying for all of her medications.  She also said she would like to request home RN visit with [facility] RN. CSW informed [name] that [name] would call RN [name] and inform [name] that [name] requests a community RN be assigned to her with [facility] for nursing support..  [name] agreed to this plan. CSW gave [name] [facility] CSW number of [phone].  CSW thanked [name] for phone call with CSW on 07/31/15.  Plan: [name] to read/review [facility] consent form and complete [name] and mailed completed consent form to [name] office in [place], [name]. [place] to call [name] RN and inform [name] that [name] requests community care RN from [name] be  assigned to [name] to address [name] nursing needs. CSW to call [name] next week to assess [name] needs.  Norva Riffle.Nazifa Trinka MSW, LCSW Licensed Clinical Social Worker [facility] Care Management [phone]      Plan:

## 2015-08-01 ENCOUNTER — Other Ambulatory Visit: Payer: Self-pay | Admitting: Pharmacist

## 2015-08-01 ENCOUNTER — Other Ambulatory Visit: Payer: Self-pay | Admitting: *Deleted

## 2015-08-01 DIAGNOSIS — I10 Essential (primary) hypertension: Secondary | ICD-10-CM

## 2015-08-01 NOTE — Patient Outreach (Signed)
[name] is a 69 y.o. female referred through [facility] for Medication Assistance.  Left a HIPAA compliant message on the patient's voicemail. If have not heard from patient by next week, will give her another call at that time.  Harlow Asa, PharmD Clinical Pharmacist [facility] [phone]

## 2015-08-01 NOTE — Patient Outreach (Signed)
[facility]) Care Management  08/01/2015  [name] June 09, 1946 [id number]   Referral from [employer] (direct [facility] referral).  Telephone call to patient; advised of reason for call and of [facility] care management services. Patient gave HIPPA verification but unable to talk now due to has appointment.   Patient is requesting call back.  04:45 pm Return call to patient. Patient is voicing that blood pressure is currently not controlled. States currently primary care doctor is adjusting her medications. States she has had episodes where blood pressure drops when she stands up.  States she goes to doctor's office and they are checking blood pressure when she is sitting,  lying and standing. Patient advised of falls precautions and advised to be careful when moving to sitting or lying to standing.    States she also has diabetes and last hemoglobin A1c level was 6.4 but she needs to get it lower. States she has glucometer and checks own blood sugar. Patient states she has never had outpatient diabetic instruction.  States she is in the donut hole and has not been able to get last 2 prescriptions filled. States she has been able to get some samples from MD office.   Patient consents to [facility] care management services. Patient has already been referred to clinical social worker and pharmacist.   Plan:  Will refer to care management assistant to assign to community care coordinator for complex case management . Blood pressure not controlled; currently medications being adjusted by primary care doctor. Patient is a falls risk due sudden drops in blood pressure.  States she has diabetes and has never had diabetic teaching.   [name], RN BSN [place] Management Coordinator [facility] Care Management  [phone]

## 2015-08-04 ENCOUNTER — Other Ambulatory Visit: Payer: Self-pay | Admitting: Pharmacist

## 2015-08-04 NOTE — Patient Outreach (Signed)
[name] is a 69 y.o. female referred to pharmacy for medication assistance. Called and spoke to [name] who reports that she would like help with affording her medications. Reports that she had extra help with her medications years ago, but does not at this time. Discussed with patient the extra help application. [name] reports that she would prefer to not provide information like her social security number. States that her daughter can help her with this application and that she will have her daughter to do this with her. Let [name] know where the form can be found online.   [name] states that she has no further questions for me. Provide her with my phone number. Let [name] know that I will follow up with her in about 1 month, but to please call me if she hears back from [employer] sooner.  Harlow Asa, PharmD Clinical Pharmacist [place] [facility] [phone]

## 2015-08-05 ENCOUNTER — Ambulatory Visit: Payer: Commercial Managed Care - HMO | Admitting: Nurse Practitioner

## 2015-08-07 ENCOUNTER — Other Ambulatory Visit: Payer: Self-pay | Admitting: Licensed Clinical Social Worker

## 2015-08-07 ENCOUNTER — Other Ambulatory Visit: Payer: Self-pay | Admitting: *Deleted

## 2015-08-07 ENCOUNTER — Encounter: Payer: Self-pay | Admitting: Licensed Clinical Social Worker

## 2015-08-07 NOTE — Patient Outreach (Signed)
[facility]) Care Management  08/07/2015  [name] August 07, 1946 [id number]  [name] returned a call to me today in response to my outreach to her earlier this week. She has previously spoken with our telephonic case manager who referred [name] to our pharmacy and social work programs to assist with medication expense concerns and social work issues.   Today, [name] said she is very interested in our services and help but her son has become very ill and is currently hospitalized at [facility]. She anticipates only being home today and 1 day next week as she is staying bedside with her son in [place]. She asked if I would send literature/information to her in the mail and stated she would have to engage with [facility] when things were more certain with her son. I will not close [name]'s case but will give her a few weeks, as per her request, and call her again.   I have notified [name], Theadore Nan LCSW, Tommy Rainwater PharmD, and [name] RN (Telephonic Case Manager) of the aforementioned information.    [facility]  [phone]

## 2015-08-07 NOTE — Patient Outreach (Signed)
Assessment:  CSW called client on 08/07/15 and spoke via phone with client on 08/07/15.  CSW verified identity of client.  Client and CSW spoke of current client needs. CSW spoke with client about client care plan goal. Client said she has appointment with [name], her primary doctor, in January of 2017. She is trying to attend all scheduled medical appointments for client. She said her son had undergone eye surgery in [place] and she planned on spending time with her son next week in [name]. She said she had spoken recently with [facility] RN Janalyn Shy about client nursing needs.  CSW encouraged client to call CSW at [phone] as needed to address social work needs of client. CSW thanked client for phone conversation with CSW on 08/07/15.   Plan: Client to attend scheduled medical appointments for client in next 30 days. CSW to collaborate with RN Janalyn Shy as needed in monitoring needs of client. CSW to call client in three weeks to assess needs of client.  Norva Riffle.Emony Dormer MSW, LCSW Licensed Clinical Social Worker [facility] Care Management [phone]

## 2015-08-14 ENCOUNTER — Ambulatory Visit (INDEPENDENT_AMBULATORY_CARE_PROVIDER_SITE_OTHER): Payer: Commercial Managed Care - HMO | Admitting: Nurse Practitioner

## 2015-08-14 ENCOUNTER — Encounter: Payer: Self-pay | Admitting: Nurse Practitioner

## 2015-08-14 VITALS — BP 129/60 | HR 50 | Temp 97.3°F | Ht 65.0 in | Wt 248.0 lb

## 2015-08-14 DIAGNOSIS — K5900 Constipation, unspecified: Secondary | ICD-10-CM

## 2015-08-14 MED ORDER — LINACLOTIDE 145 MCG PO CAPS: 145.0000 ug | ORAL_CAPSULE | Freq: Every day | ORAL | Status: DC

## 2015-08-14 NOTE — Assessment & Plan Note (Signed)
Patient with moderate to severe constipation which is generally well controlled on Linzess 145 g daily. She is in the donut hole and unable to afford her prescription currently and has been out. When she is on the medication her bowel movements are daily soft and passing easily. Now she is off medication her stools are hard, every other day, require significant straining and resulting abdominal pain. Today we will provide her with samples to get her through the end of the year and send in a prescription to be filled by her insurance after the first of the year. Return for follow-up in 6 months.

## 2015-08-14 NOTE — Patient Instructions (Signed)
1. We will provide you with one box (4 pills) of samples today. 2. When we get new stock of samples in today we'll put some at the front desk to last you to the end of the year. 3. I send in a prescription to [facility] with a note on the prescription did not fill until after the first of the year. 4. Return for follow-up in 6 months.

## 2015-08-14 NOTE — Progress Notes (Signed)
cc'ed to pcp °

## 2015-08-14 NOTE — Progress Notes (Signed)
Referring Provider: [name], MD Primary Care Physician:  [name], MD Primary GI:  Dr. Oneida Alar  Chief Complaint  Patient presents with  . Constipation    HPI:   69 year old female presents for follow-up on constipation. His last seen in our office 05/06/2015 for the same and she is unable to afford Amitiza with her insurance and was switched to Linzess 145 g daily. Patient also notes Linzess seems to be working well for her, however she is currently in the donut hole. Colonoscopy completed on 09/24/2014 and up-to-date with recommended repeat screening in 5-10 years.  Today she states Amitiza works well for her. She is in the donut ole and cannot afford it again until the first of the year (2 weeks from now.) When taking Linzess she has a bowel movement daily and stools are soft and pass easily without straining. However, she has run out and when she's not on Linzess she is having a bowel movement about every other day with hard stools and associated abdominal cramping. Denies hematochezia, melena, N/V. Denies chest pain, dyspnea, dizziness, lightheadedness, syncope, near syncope. Denies any other upper or lower GI symptoms.  Past Medical History  Diagnosis Date  . Hypertension   . Diverticulitis   . Cyst on liver  . Blood clot of artery under arm ([place])   . Diabetes mellitus     controlled by diet  . Menopause   . Fibrocystic breast disease   . Impaired glucose tolerance   . Hepatic cyst   . IBS (irritable bowel syndrome)   . DVT of axillary vein, acute right 11/24/2012    Dx on Jan 28, 2012.      Past Surgical History  Procedure Laterality Date  . Back surgery    . Cholecystectomy    . Knee arthrocentesis  left  . Breast surgery  right    cyst removed  . Abdominal hysterectomy    . Tubal ligation    . Cast application  [id number]    Procedure: CAST APPLICATION;  Surgeon: Carole Civil, MD;  Location: [facility];  Service: Orthopedics;  Laterality: Right;   procedure room   . Colonoscopy  12/04/2004    NUR:Few small diverticula at sigmoid colon/Small cecal polyp ablated   . Colonoscopy N/A 09/24/2014    SLF: small internal hemorrhoids    Current Outpatient Prescriptions  Medication Sig Dispense Refill  . acetaminophen (TYLENOL) 500 MG tablet Take 500 mg by mouth every 6 (six) hours as needed.    . Alcohol Swabs (B-D SINGLE USE SWABS REGULAR) PADS USE AS DIRECTED 100 each 3  . aspirin 81 MG tablet Take 81 mg by mouth daily.    . Biotin w/ Vitamins C & E (HAIR SKIN & NAILS GUMMIES PO) Take 1 each by mouth 2 (two) times daily.    . Cholecalciferol (VITAMIN D3 PO) Take 1,000 Units by mouth daily.    . citalopram (CELEXA) 10 MG tablet Take 1 tablet (10 mg total) by mouth daily. 90 tablet 1  . dicyclomine (BENTYL) 10 MG capsule Take 1 capsule (10 mg total) by mouth 3 (three) times daily as needed for spasms. 90 capsule 3  . glipiZIDE (GLUCOTROL) 5 MG tablet Take 1 tab po q am and 1/2 tab po suppertime 135 tablet 3  . hydrochlorothiazide (HYDRODIURIL) 12.5 MG tablet Take 1 tablet (12.5 mg total) by mouth daily. 90 tablet 3  . ketoconazole (NIZORAL) 2 % cream Apply 1 application topically 2 (two) times  daily. 30 g 4  . Linaclotide (LINZESS) 145 MCG CAPS capsule Take 1 capsule (145 mcg total) by mouth daily. 30 capsule 3  . losartan (COZAAR) 50 MG tablet Take 1 tablet (50 mg total) by mouth daily. (Patient taking differently: Take 50 mg by mouth daily. Take  0.5 tablet daily) 30 tablet 0  . Multiple Vitamins-Minerals (MULTIVITAMIN WITH MINERALS) tablet Take 1 tablet by mouth daily.    [name] omeprazole (PRILOSEC) 20 MG capsule Take 1 capsule (20 mg total) by mouth daily as needed. (Patient taking differently: Take 20 mg by mouth daily. ) 90 capsule 3  . pravastatin (PRAVACHOL) 20 MG tablet Take 1 tablet (20 mg total) by mouth daily. 90 tablet 1  . Probiotic Product ([place]) CAPS Take by mouth daily.     No current facility-administered  medications for this visit.    Allergies as of 08/14/2015 - Review Complete 08/14/2015  Allergen Reaction Noted  . Norvasc [amlodipine besylate]  11/21/2012  . Penicillins Hives   . Advair diskus [fluticasone-salmeterol] Palpitations 11/21/2012  . Metformin and related  09/28/2013    Family History  Problem Relation Age of Onset  . Stroke Mother   . Heart attack Mother   . Cancer Sister   . Diabetes Sister   . Seizures Brother   . Diabetes Brother   . Heart disease Brother   . Colon cancer Neg Hx     Social History   Social History  . Marital Status: Married    Spouse Name: N/A  . Number of Children: N/A  . Years of Education: N/A   Occupational History  . Retired    Social History Main Topics  . Smoking status: Never Smoker   . Smokeless tobacco: Never Used  . Alcohol Use: No  . Drug Use: No  . Sexual Activity: Yes   Other Topics Concern  . None   Social History Narrative    Review of Systems: General: Negative for anorexia, weight loss, fever, chills, fatigue, weakness. Eyes: Negative for vision changes.  ENT: Negative for hoarseness, difficulty swallowing , nasal congestion. CV: Negative for chest pain, angina, palpitations, dyspnea on exertion, peripheral edema.  Respiratory: Negative for dyspnea at rest, dyspnea on exertion, cough, sputum, wheezing.  GI: See history of present illness. GU:  Negative for dysuria, hematuria, urinary incontinence, urinary frequency, nocturnal urination.  MS: Negative for joint pain, low back pain.  Derm: Negative for rash or itching.  Neuro: Negative for weakness, abnormal sensation, seizure, frequent headaches, memory loss, confusion.  Psych: Negative for anxiety, depression, suicidal ideation, hallucinations.  Endo: Negative for unusual weight change.  Heme: Negative for bruising or bleeding. Allergy: Negative for rash or hives.   Physical Exam: BP 129/60 mmHg  Pulse 50  Temp(Src) 97.3 F (36.3 C) (Oral)  Ht 5'  5" (1.651 m)  Wt 248 lb (112.492 kg)  BMI 41.27 kg/m2 General:   Alert and oriented. Pleasant and cooperative. Well-nourished and well-developed.  Head:  Normocephalic and atraumatic. Eyes:  Without icterus, sclera clear and conjunctiva pink.  Ears:  Normal auditory acuity. Mouth:  No deformity or lesions, oral mucosa pink.  Throat/Neck:  Supple, without mass or thyromegaly. Cardiovascular:  S1, S2 present without murmurs appreciated. Normal pulses noted. Extremities without clubbing or edema. Respiratory:  Clear to auscultation bilaterally. No wheezes, rales, or rhonchi. No distress.  Gastrointestinal:  +BS, soft, non-tender and non-distended. No HSM noted. No guarding or rebound. No masses appreciated.  Rectal:  Deferred  Musculoskalatal:  Symmetrical without gross deformities. Normal posture. Skin:  Intact without significant lesions or rashes. Neurologic:  Alert and oriented x4;  grossly normal neurologically. Psych:  Alert and cooperative. Normal mood and affect. Heme/Lymph/Immune: No significant cervical adenopathy. No excessive bruising noted.    08/14/2015 8:09 AM

## 2015-08-20 ENCOUNTER — Other Ambulatory Visit (HOSPITAL_COMMUNITY): Payer: Self-pay

## 2015-08-20 DIAGNOSIS — I82A11 Acute embolism and thrombosis of right axillary vein: Secondary | ICD-10-CM

## 2015-08-28 ENCOUNTER — Other Ambulatory Visit: Payer: Self-pay | Admitting: Licensed Clinical Social Worker

## 2015-08-28 NOTE — Patient Outreach (Signed)
Assessment:  CSW called client phone number on 08/28/15.  CSW spoke via phone with client on 08/28/15. CSW verified client identity.Client gave CSW verbal permission on 08/28/15 for CSW to speak with client about client needs  Client and CSW spoke of client needs. Client reported that she has a scheduled appointment with [name] in January of 2017.  She said she has her prescribed medications and is taking medications as prescribed. She said she had talked with [facility] Pharmacist, Harlow Asa about Extra Help application.  Grayland Ormond had given client the web address to apply for Extra Help support with medication costs. Client said she has web address for Extra Help application and that she and her daughter will complete Extra Help application on line soon and will submit application on line for Extra Help support.. She has transport assistance as needed to go to and from her medical appointments. She said she has adequate food supply and has a good appetite. She reported that she  is sleeping well.  She said she does not have any appointments for specialists visits at present.  She said she still has financial challenges. She said she and her spouse are able to pay their monthly bills at present.  She is appreciative of [facility] program support.  She said her son, who recently had eye surgery in [facility], is doing well and is healing from his surgery. She and CSW spoke of client care plan. She said she will try to attend scheduled medical appointments in next 30 days. She and CSW spoke of client support with [facility] program.  CSW encouraged client to call CSW at [phone] as needed to discuss social work needs of client. CSW thanked client for phone call with CSW on 08/28/15.  Plan: Client to attend all scheduled medical appointments for client in next 30 days. CSW to call client in 4 weeks to assess needs of client at that time.  Norva Riffle.Manjot Beumer MSW, LCSW Licensed Clinical Social Worker [facility]  Care Management [phone]

## 2015-08-29 ENCOUNTER — Other Ambulatory Visit: Payer: Self-pay | Admitting: *Deleted

## 2015-08-29 NOTE — Patient Outreach (Signed)
[facility]) Care Management  08/29/2015  [name] 1946-08-09 [id number]  Unable to reach [name] by phone today. She was referred to [facility] for Disease Management, Pharmacy Assistance, and Financial Resource assistance. [name] has spoken with Harlow Asa PharmD who provided requested resources for medication assistance. Mr. Theadore Nan LCSW has engaged [name] regarding resources available to her.   When I last spoke with [name], she was quite engaged with her son's illness/hospitalization at [facility] and asked if we could talk later about her health care needs. I agreed and will plan to reach out to her again in the next 1-2 weeks, not having reached her today.    [facility]  [phone]

## 2015-08-31 DIAGNOSIS — I742 Embolism and thrombosis of arteries of the upper extremities: Secondary | ICD-10-CM

## 2015-08-31 HISTORY — DX: Embolism and thrombosis of arteries of the upper extremities: I74.2

## 2015-09-03 ENCOUNTER — Encounter (HOSPITAL_COMMUNITY): Payer: Commercial Managed Care - HMO | Attending: Hematology & Oncology

## 2015-09-03 DIAGNOSIS — I808 Phlebitis and thrombophlebitis of other sites: Secondary | ICD-10-CM | POA: Insufficient documentation

## 2015-09-03 DIAGNOSIS — I82A11 Acute embolism and thrombosis of right axillary vein: Secondary | ICD-10-CM

## 2015-09-03 LAB — D-DIMER, QUANTITATIVE: D-Dimer, Quant: 0.36 ug/mL-FEU (ref 0.00–0.50)

## 2015-09-12 ENCOUNTER — Other Ambulatory Visit: Payer: Self-pay | Admitting: Pharmacist

## 2015-09-12 NOTE — Patient Outreach (Signed)
Received a call back from Ms. Maricela Bo. Patient states that she has not yet completed the Extra Help application. Reports that her daughter is headed over to her house today and that she will try and do this with her tonight. Again let patient know that we are available to assist her with this application. However, patient declines this offer, stating that she would prefer to do this herself. States that if she does not do this with her daughter today, she will go to the [place] office in person next week and fill out a paper copy of the application. Patient asks that I follow up with her again in 1 month, but states that she will call me if she hears back sooner.  Harlow Asa, PharmD Clinical Pharmacist [place] Management [phone]

## 2015-09-12 NOTE — Patient Outreach (Signed)
Called to follow up with [name]. However, when I call, patient does not wish to verify her date of birth or address. Offer patient our main line phone number in order to verify who I am. Patient to call main line and will be forwarded to my cell phone. Will speak with patient when she returns my call. If have not heard from patient by next week, will follow up again at that time.  Harlow Asa, PharmD Clinical Pharmacist [place] [facility] [phone]

## 2015-09-24 ENCOUNTER — Other Ambulatory Visit: Payer: Self-pay | Admitting: Pharmacist

## 2015-09-24 NOTE — Patient Outreach (Signed)
Received a voicemail from Ms. Maricela Bo requesting a call back. Patient is requesting assistance with completing the Extra Help application at this time. Have previously offered to complete this application with the patient on two occasions. However, the patient has declined, stating that she was not comfortable with providing her personal information and that she would prefer to do this with her daughter. However, at this time, call patient back and she states that her daughter is not able to help her with this application and she feels comfortable with having me do this with her through the Social Security website today.  Both Mrs and Mr. Skenandore join me on the phone, both husband and wife agree to completing the application with me over the phone today and provide me with the answers to the questions for this application. When complete, I read over their responses that I have typed for their verification. Prior to submitting the application, I read the statement on the final page of the application to both of the applicants and receive their agreement to submit the application on their behalf.  Let Mrs and Mr. Dey know that it typically takes about 2 to 4 weeks to get a response from social security in the mail. Ask Mrs. Claytor to call me when she receives this response. Mrs. Berarducci reports that she has gone ahead and ordered a 30 day supply of Linzess through [employer] as she was out of this medication. Reports that she also has an office visit scheduled with her PCP next week and that she will also ask him about samples that the office might have at that time.   PLAN:  1) Mrs. Dunnell to call once she receives a response to the extra help application. If have not heard back from her in 1 month, will follow up again at that time. If patient is denied for extra help, will again discuss the patient assistance application for Linzess with the patient.  Harlow Asa,  PharmD Clinical Pharmacist [place] [facility] [phone]

## 2015-09-26 ENCOUNTER — Other Ambulatory Visit: Payer: Self-pay | Admitting: *Deleted

## 2015-09-26 ENCOUNTER — Ambulatory Visit: Payer: Self-pay | Admitting: Licensed Clinical Social Worker

## 2015-09-26 NOTE — Patient Outreach (Signed)
[facility]) Care Management  09/26/2015  [name] [birth year] [id number]  I was able to reach [name] late this afternoon to follow up with her about [name] management Nursing services. Mrs. [name] related her gratitude for the help received from Tommy Rainwater, [place] [facility] Pharmacist. With the help of Benjamine Mola, Mrs. [name] and her husband were able to complete the Medicare Extra Help application. If approved, Mrs. [name] will experience a significant decrease in out of pocket cost for prescription medications. Mrs. [name] told me that she paid $57 for her Linzess this afternoon but was grateful to have it and is looking forward to lower medication costs so she can adhere to her provider's plan of care.   I offered to schedule an appointment to see her at home to review any nursing needs she may have related to coordination of care and/or chronic disease management needs. Mrs. [name] declined to schedule a visit but said she would be agreeable to a call after her appointment with [name] on Tuesday.   Plan: I will notify Tommy Rainwater of my conversation with Mrs. [name] and will reach out to Mrs. [name] again next week.    [facility] [facility]  [phone]

## 2015-09-30 ENCOUNTER — Ambulatory Visit (INDEPENDENT_AMBULATORY_CARE_PROVIDER_SITE_OTHER): Payer: Commercial Managed Care - HMO | Admitting: Family Medicine

## 2015-09-30 ENCOUNTER — Encounter: Payer: Self-pay | Admitting: Licensed Clinical Social Worker

## 2015-09-30 ENCOUNTER — Other Ambulatory Visit: Payer: Self-pay | Admitting: Licensed Clinical Social Worker

## 2015-09-30 VITALS — BP 122/70 | Ht 65.0 in | Wt 250.0 lb

## 2015-09-30 DIAGNOSIS — E119 Type 2 diabetes mellitus without complications: Secondary | ICD-10-CM | POA: Diagnosis not present

## 2015-09-30 DIAGNOSIS — R062 Wheezing: Secondary | ICD-10-CM | POA: Diagnosis not present

## 2015-09-30 DIAGNOSIS — I10 Essential (primary) hypertension: Secondary | ICD-10-CM | POA: Diagnosis not present

## 2015-09-30 DIAGNOSIS — G4700 Insomnia, unspecified: Secondary | ICD-10-CM

## 2015-09-30 DIAGNOSIS — R5383 Other fatigue: Secondary | ICD-10-CM | POA: Diagnosis not present

## 2015-09-30 DIAGNOSIS — Z780 Asymptomatic menopausal state: Secondary | ICD-10-CM

## 2015-09-30 LAB — POCT GLYCOSYLATED HEMOGLOBIN (HGB A1C): HEMOGLOBIN A1C: 6.4

## 2015-09-30 MED ORDER — TRAZODONE HCL 50 MG PO TABS: ORAL_TABLET | ORAL | Status: DC

## 2015-09-30 MED ORDER — MELOXICAM 7.5 MG PO TABS: 7.5000 mg | ORAL_TABLET | Freq: Every day | ORAL | Status: DC

## 2015-09-30 NOTE — Patient Outreach (Signed)
Assessment:  CSW called home phone number of client on 09/30/15 and spoke via phone with client. CSW verified client identity. Client and CSW spoke of client needs. Client recently worked with [facility] pharmacist, Harlow Asa to complete Medicare Extra Help application for client.  This application was submitted online for client and now client is waiting for response regarding her application for Extra Help support.  Client is attending her scheduled medical appointments. Client has her prescribed medications and is taking medications as prescribed.  She had informed CSW last month that she has some financial challenges; however, she said that she and her spouse are able to pay their monthly expenses at present.  She does not have any transport needs at present.  Client had a medical appointment with her primary doctor, [name] on 09/30/15.  CSW and client spoke of client care plan. CSW encouraged client to attend all scheduled client medical appointments in next 30 days. Client said she would try to attend her scheduled medical appointments in next 30 days. CSW invited Aunesti to call CSW at [phone] as needed to discuss social work needs of client. CSW thanked client for phone call with CSW on 09/30/15.   Plan: Client to attend all scheduled client medical appointments in next 30 days. CSW to call client in 4 weeks to assess client needs at that time.   Norva Riffle.Taralee Marcus MSW, LCSW Licensed Clinical Social Worker [facility] Care Management [phone]

## 2015-09-30 NOTE — Progress Notes (Signed)
Subjective:    Patient ID: [name], female    DOB: [birth year], 70 y.o.   MRN: [id number]  Diabetes She presents for her follow-up diabetic visit. She has type 2 diabetes mellitus. Pertinent negatives for hypoglycemia include no confusion. Pertinent negatives for diabetes include no chest pain, no fatigue, no polydipsia, no polyphagia and no weakness. She is compliant with treatment all of the time. Diabetic current diet: eats on the go alot.  She never participates in exercise. She does not see a podiatrist.Eye exam is not current.  pt brought in blood sugar readings.  A1C 6.4.  Wheezing at night. Started over one month ago. She denies any shortness of breath when she moves around.  Fatigue. Started beg of jan. Pt has been traveling a lot with son. Taking care of son and husband. Taking them to a lot of appts. Has not been eating healthy. Eating on the go a lot.   Numbness in hands and toes. These symptoms come and go. They are not severe.  Right shoulder pain. Doing exercises that she was given to do. Intermittent pain with movement. Denies weakness in the arm.   Bilateral ankle swelling. Left worse. Going on for awhile. Denies PND denies shortness of breath denies chest pressure or tightness.  Callus on right foot. She has a bunion on her foot significant calluses well  Pt requesting bone density test. Pt states she has never had one.  She does relate moderate fatigue and tiredness Relates reflux under good control with her medication She does state she tolerates her cholesterol medicine She does have a hard time sleeping at night Denies any low sugar spells.    Review of Systems  Constitutional: Negative for activity change, appetite change and fatigue.  HENT: Negative for congestion.   Respiratory: Negative for cough.   Cardiovascular: Positive for leg swelling. Negative for chest pain.  Gastrointestinal: Negative for abdominal pain.  Endocrine: Negative for  polydipsia and polyphagia.  Musculoskeletal: Positive for back pain and arthralgias.  Neurological: Negative for weakness.  Psychiatric/Behavioral: Negative for behavioral problems, confusion and agitation.       Objective:   Physical Exam  Constitutional: She appears well-nourished. No distress.  Cardiovascular: Normal rate, regular rhythm and normal heart sounds.   No murmur heard. Pulmonary/Chest: Effort normal and breath sounds normal. No respiratory distress.  Musculoskeletal: She exhibits no edema.  Lymphadenopathy:    She has no cervical adenopathy.  Neurological: She is alert. She exhibits normal muscle tone.  Psychiatric: Her behavior is normal.  Vitals reviewed.   25 minutes was spent with the patient. Greater than half the time was spent in discussion and answering questions and counseling regarding the issues that the patient came in for today. Very nice patient has multitudes of problems going on that required 25 minutes to discuss all these issues and answer her questions in addition to this she is under a lot of stress from caring for her family in his stretched beyond her limits.      Assessment & Plan:  1. Type 2 diabetes mellitus without complication, unspecified long term insulin use status (HCC) A1c overall looks good she is not having any low sugar spells continue current medication watch diet stay active - POCT glycosylated hemoglobin (Hb A1C)  2. Essential hypertension Blood pressure under good control check metabolic 7 continue current measures - Basic metabolic panel  3. Wheeze I find no evidence of any asthma or COPD I do not recommend pulmonary function  testing. If symptoms worsen follow-up  4. Insomnia We will try trazodone to see without help her with her sleep if she has ongoing trouble she will follow-up  5. Other fatigue Significant fatigue we will go forward with some testing as well as recommending that her sleep - CBC with  Differential/Platelet - TSH - Basic metabolic panel  6. Post-menopausal Bone density for screening purposes - DG Bone Density

## 2015-10-01 ENCOUNTER — Encounter: Payer: Self-pay | Admitting: Family Medicine

## 2015-10-01 LAB — BASIC METABOLIC PANEL
BUN/Creatinine Ratio: 21 (ref 11–26)
BUN: 16 mg/dL (ref 8–27)
CALCIUM: 9.8 mg/dL (ref 8.7–10.3)
CO2: 26 mmol/L (ref 18–29)
Chloride: 100 mmol/L (ref 96–106)
Creatinine, Ser: 0.75 mg/dL (ref 0.57–1.00)
GFR calc Af Amer: 95 mL/min/{1.73_m2} (ref >59)
GFR, EST NON AFRICAN AMERICAN: 83 mL/min/{1.73_m2} (ref >59)
GLUCOSE: 68 mg/dL (ref 65–99)
POTASSIUM: 4.5 mmol/L (ref 3.5–5.2)
Sodium: 141 mmol/L (ref 134–144)

## 2015-10-01 LAB — CBC WITH DIFFERENTIAL/PLATELET
BASOS: 0 %
Basophils Absolute: 0 10*3/uL (ref 0.0–0.2)
EOS (ABSOLUTE): 0.2 10*3/uL (ref 0.0–0.4)
EOS: 3 %
HEMATOCRIT: 41 % (ref 34.0–46.6)
Hemoglobin: 13.4 g/dL (ref 11.1–15.9)
IMMATURE GRANS (ABS): 0 10*3/uL (ref 0.0–0.1)
IMMATURE GRANULOCYTES: 0 %
LYMPHS: 56 %
Lymphocytes Absolute: 4 10*3/uL — ABNORMAL HIGH (ref 0.7–3.1)
MCH: 24.9 pg — ABNORMAL LOW (ref 26.6–33.0)
MCHC: 32.7 g/dL (ref 31.5–35.7)
MCV: 76 fL — AB (ref 79–97)
Monocytes Absolute: 0.5 10*3/uL (ref 0.1–0.9)
Monocytes: 7 %
NEUTROS PCT: 34 %
Neutrophils Absolute: 2.4 10*3/uL (ref 1.4–7.0)
PLATELETS: 316 10*3/uL (ref 150–379)
RBC: 5.39 x10E6/uL — AB (ref 3.77–5.28)
RDW: 17.1 % — ABNORMAL HIGH (ref 12.3–15.4)
WBC: 7.2 10*3/uL (ref 3.4–10.8)

## 2015-10-01 LAB — TSH: TSH: 1.67 u[IU]/mL (ref 0.450–4.500)

## 2015-10-03 ENCOUNTER — Encounter: Payer: Self-pay | Admitting: *Deleted

## 2015-10-03 ENCOUNTER — Other Ambulatory Visit: Payer: Self-pay | Admitting: *Deleted

## 2015-10-03 NOTE — Patient Outreach (Signed)
[facility]) Care Management  10/03/2015  HANAAN OBERG Jun 28, 1946 [id number]  Follow up call to Mrs. Providence today. She was referred by [facility] ([facility]) to our telephonic CM team and then on to me. I spoke with Mrs. Cutler twice and again today. She had a visit with her primary care provider yesterday and was pleased to learn that "all the problems are stable". Mrs. Abts does not wish to engage at this time with [facility] for nursing services. She expressed her gratitude for our concern and outreach and was grateful also to have my contact information should needs arise in the future.   Plan: I will close Mrs. Brock nursing case management case.    [place] Management  [phone]

## 2015-10-24 ENCOUNTER — Telehealth: Payer: Self-pay | Admitting: Family Medicine

## 2015-10-24 ENCOUNTER — Ambulatory Visit (INDEPENDENT_AMBULATORY_CARE_PROVIDER_SITE_OTHER): Payer: Commercial Managed Care - HMO | Admitting: Family Medicine

## 2015-10-24 ENCOUNTER — Encounter: Payer: Self-pay | Admitting: Family Medicine

## 2015-10-24 VITALS — Temp 98.3°F | Ht 65.0 in | Wt 252.4 lb

## 2015-10-24 DIAGNOSIS — L03211 Cellulitis of face: Secondary | ICD-10-CM

## 2015-10-24 MED ORDER — DOXYCYCLINE HYCLATE 100 MG PO CAPS: 100.0000 mg | ORAL_CAPSULE | Freq: Two times a day (BID) | ORAL | Status: DC

## 2015-10-24 NOTE — Telephone Encounter (Signed)
Pt called stating that she woke up this morning and there was a knot in the middle of her forehead. Pt states that the knot is hard and painful. Pt is concerned due to the fact that she has been having blood clots recently. The only other symptoms she is having is a cough. Please advise.

## 2015-10-24 NOTE — Telephone Encounter (Signed)
Pt transferred to front to schedule ov today.

## 2015-10-24 NOTE — Progress Notes (Signed)
   Subjective:    Patient ID: YOUNIQUE MY, female    DOB: [birth year], 70 y.o.   MRN: [id number]  HPI Patient arrives with 2 knots on forehead today This series been present for the past few days is tender to the touch red swollen she isn't quite sure what caused it she's never had this before. No fever or chills with it. No other associated symptoms. PMH benign.  Review of Systems    no nausea vomiting diarrhea fever chills Objective:   Physical Exam Lungs clear hearts regular she has what appears to be a cellulitis on her forehead possibly a block skin poor the rest of the exam appears normal. This area is small approximate 2 cm in diameter. No fluctuance       Assessment & Plan:  Cellulitis-warm compresses on a frequent basis antibiotics prescribed warning signs discussed follow-up if problems

## 2015-10-27 ENCOUNTER — Other Ambulatory Visit: Payer: Self-pay | Admitting: Pharmacist

## 2015-10-27 ENCOUNTER — Other Ambulatory Visit: Payer: Self-pay | Admitting: Family Medicine

## 2015-10-27 NOTE — Patient Outreach (Signed)
Called to follow up with Mrs. Evjen letting her know that I spoke with Nilda at [facility] [phone]). Nilda reports that the patient's payment for Linzess this month would be $47 because she still needed to pay toward her deductible. Reports that her deductible was reduced to $82 by the extra help. However, reports that the only payment that the patient has made toward that deductible was her copay for Linzess last month on 09/23/15 for $47. Reports that all other medications that the patient has received since the beginning of the year have been free. Nilda reports that this month's copay will complete the patient's deductible. Reports that follow this, the patient's copays will still be free for tier 1, $3.30 for other generics and $8.25 for her Linzess.  Mrs. Utt expresses appreciation for this information. Reports that she has no further questions for me at this time. Also let Mrs. Maler know that I will be out on maternity leave for 12 weeks and provide her with the main line number to call to request to speak with one of our other pharmacists if needed while I am away.   Will close this pharmacy episode.   Harlow Asa, PharmD Clinical Pharmacist [place] [facility] [phone]

## 2015-10-27 NOTE — Patient Outreach (Signed)
Received a voicemail from Mrs. Torre letting me know that she received a response back regarding the extra help application and that she had been approved for 75% extra help. However, reported that she had called her [place] and that she was told that her copay for her Linzess was still $47/month.  Called and spoke with Wilcox at [facility] [phone]). Nilda reports that the patient's payment for Linzess this month would be $47 because she still needed to pay toward her deductible. Reports that her deductible was reduced to $82 by the extra help. However, reports that the only payment that the patient has made toward that deductible was her copay for Linzess last month on 09/23/15 for $47. Reports that all other medications that the patient has received since the beginning of the year have been free. Nilda reports that this month's copay will complete the patient's deductible. Reports that follow this, the patient's copays will still be free for tier 1, $3.30 for other generics and $8.25 for her Linzess.  Will now call to let Mrs. Wendland know this information.  Harlow Asa, PharmD Clinical Pharmacist [place] [facility] [phone]

## 2015-10-28 ENCOUNTER — Telehealth: Payer: Self-pay | Admitting: Gastroenterology

## 2015-10-28 ENCOUNTER — Other Ambulatory Visit: Payer: Self-pay | Admitting: Licensed Clinical Social Worker

## 2015-10-28 DIAGNOSIS — K5900 Constipation, unspecified: Secondary | ICD-10-CM

## 2015-10-28 MED ORDER — LINACLOTIDE 145 MCG PO CAPS: 145.0000 ug | ORAL_CAPSULE | Freq: Every day | ORAL | Status: DC

## 2015-10-28 NOTE — Telephone Encounter (Signed)
Pt stopped by front office asking to get Linzess samples. DS is aware

## 2015-10-28 NOTE — Patient Outreach (Signed)
Assessment: CSW called client home phone number on 10/28/15 and spoke via phone with client. CSW verified client identity. CSW and client spoke of client needs. Client said she has her prescribed medications and is taking medications as prescribed. She said she has no transport needs at present. She sees [name] as her primary care doctor. She said she had a medical appointment with [name] in January of 2017.  She said she did not have any nursing needs at present.  She said she has an adequate food supply. She said she is sleeping well. Client did speak several times with [facility] pharmacist, Harlow Asa, regarding Extra Help application for client and regarding medication needs of client.  Client is able to obtain her prescribed medications; and, Harlow Asa, [facility] pharmcist, has now closed pharmacy support to client since client had no further pharmacy needs at present.  CSW and client spoke of client care plan.  CSW encouraged client to attend all scheduled client medical appointments in next 30 days.  CSW thanked client for phone call with CSW on 10/28/15. CSW encouraged client to call CSW at [phone] as needed to discuss social work needs of client.   Plan:  Client to attend all scheduled client medical appointments in next 30 days. CSW to call client in 4 weeks to assess client needs at that time.  Norva Riffle.Jearlene Bridwell MSW, LCSW Licensed Clinical Social Worker [facility] Care Management [phone]

## 2015-10-28 NOTE — Telephone Encounter (Signed)
Pt was requesting samples of Linzess 145 mcg. Said she was going to have to pay a deductible to [facility] and she doesn't have it at this time. She said she would like to have a prescription sent to [facility] in [place] for the [place] and see if it is cheaper. I told her we could try that.

## 2015-10-28 NOTE — Telephone Encounter (Signed)
Done

## 2015-11-17 ENCOUNTER — Ambulatory Visit (HOSPITAL_COMMUNITY)
Admission: RE | Admit: 2015-11-17 | Discharge: 2015-11-17 | Disposition: A | Discharge status: Home / Self Care | Payer: Commercial Managed Care - HMO | Source: Ambulatory Visit | Attending: Family Medicine | Admitting: Family Medicine

## 2015-11-17 ENCOUNTER — Other Ambulatory Visit: Payer: Self-pay | Admitting: Family Medicine

## 2015-11-17 DIAGNOSIS — Z13820 Encounter for screening for osteoporosis: Secondary | ICD-10-CM | POA: Diagnosis not present

## 2015-11-17 DIAGNOSIS — Z780 Asymptomatic menopausal state: Secondary | ICD-10-CM | POA: Insufficient documentation

## 2015-11-17 DIAGNOSIS — Z1231 Encounter for screening mammogram for malignant neoplasm of breast: Secondary | ICD-10-CM

## 2015-11-18 ENCOUNTER — Encounter: Payer: Self-pay | Admitting: Family Medicine

## 2015-11-25 ENCOUNTER — Encounter: Payer: Self-pay | Admitting: Licensed Clinical Social Worker

## 2015-11-25 ENCOUNTER — Other Ambulatory Visit: Payer: Self-pay | Admitting: Licensed Clinical Social Worker

## 2015-11-25 NOTE — Patient Outreach (Signed)
Assessment:  CSW called client on 11/25/15 and spoke via phone with client. CSW verified client identity. CSW and client spoke of client needs.  Client said she has her prescribed medications and is taking medications as prescribed. She said she is eating adequately and sleeping adequately.  She did not mention any nursing needs at present.  She sees [name] as her primary care doctor. She said she had an appointment with [name] on 09/30/15.  CSW and client spoke of client care plan. CSW encouraged client to attend all scheduled client medical appointments in the next 30 days.  Client said she would try to attend all her scheduled client medical appointments in the next 30 days. Client did not mention any transport needs at present. CSW thanked client for phone call with CSW on 11/25/15. CSW encouraged client to call CSW at [phone] as needed to discuss social work needs of client.    Plan:  Client to attend all scheduled client medical appointments in the next 30 days. CSW to call client in  3 weeks to assess client needs at that time.  Norva Riffle.Kamesha Herne MSW, LCSW Licensed Clinical Social Worker [facility] Care Management [phone]

## 2015-12-10 ENCOUNTER — Ambulatory Visit (HOSPITAL_COMMUNITY)
Admission: RE | Admit: 2015-12-10 | Discharge: 2015-12-10 | Disposition: A | Discharge status: Home / Self Care | Payer: Commercial Managed Care - HMO | Source: Ambulatory Visit | Attending: Family Medicine | Admitting: Family Medicine

## 2015-12-10 DIAGNOSIS — Z1231 Encounter for screening mammogram for malignant neoplasm of breast: Secondary | ICD-10-CM | POA: Diagnosis not present

## 2015-12-15 ENCOUNTER — Encounter (HOSPITAL_COMMUNITY): Payer: Commercial Managed Care - HMO | Admitting: Hematology & Oncology

## 2015-12-16 ENCOUNTER — Telehealth: Payer: Self-pay | Admitting: Family Medicine

## 2015-12-16 ENCOUNTER — Other Ambulatory Visit: Payer: Self-pay | Admitting: *Deleted

## 2015-12-16 ENCOUNTER — Other Ambulatory Visit: Payer: Self-pay | Admitting: Licensed Clinical Social Worker

## 2015-12-16 MED ORDER — OMEPRAZOLE 20 MG PO CPDR: 20.0000 mg | DELAYED_RELEASE_CAPSULE | Freq: Every day | ORAL | Status: DC | PRN

## 2015-12-16 NOTE — Telephone Encounter (Signed)
Pt is needing refills on her omeprazole 20 mg sent over to [facility] order

## 2015-12-16 NOTE — Patient Outreach (Signed)
Assessment:  CSW spoke via phone with client on 12/16/15. CSW verified client identity. Client gave CSW verbal permission on 12/16/15 for CSW to speak with client about current needs of client. CSW and client spoke of client needs.  Client said she has her prescribed medications and is taking medications as prescribed. She said she has no transportation issus. She said she drives herself to some of her medical appointments. She said also that sometimes her spouse helps in driving her to scheduled client medical appointments.She said she had appointment with [name] in January of 2017. She said she has been able to afford her prescription medications.  She said she is eating well and sleeping well.  CSW and client spoke of client care plan. CSW encouraged Stephani to continue to attend scheduled client medical appointments in the next 30 days.  Client said she did not have any nursing or pharmacy needs at present. She said she was feeling well. She said she has support of her spouse.  CSW reminded client that [facility] program was free to her based on her being a patient of [name]. Client was appreciative of call from [place] on 12/16/15. CSW encouraged client to call CSW at [phone] as needed to discuss social work needs of client.   Plan:  Client to attend all scheduled client medical appointments in the next 30 days. CSW to call client in 3 weeks to assess needs of client at that time.  Norva Riffle.Draydon Clairmont MSW, LCSW Licensed Clinical Social Worker [facility] Care Management [phone]

## 2015-12-16 NOTE — Telephone Encounter (Signed)
Med sent to pharm. Pt notified.  

## 2015-12-19 ENCOUNTER — Telehealth: Payer: Self-pay

## 2015-12-19 NOTE — Telephone Encounter (Signed)
Pt came by office today for some 145 mcg samples of Linzess. Told pt we didn't have any sample at this time but a refill was sent to [facility]. Pt states "then that means I will have to pay for them."    Pt has appt in 01/2016

## 2015-12-19 NOTE — Telephone Encounter (Signed)
Noted  

## 2015-12-26 ENCOUNTER — Telehealth: Payer: Self-pay | Admitting: Family Medicine

## 2015-12-26 NOTE — Telephone Encounter (Signed)
Pt called stating that every time she goes to the bathroom to pee she has a bowel movement. Pt states that she is having abd pains as well. Pt states this has been going on since last Friday and every time she eats it goes right out. Please advise.

## 2015-12-26 NOTE — Telephone Encounter (Signed)
Discussed with patient. Patient scheduled office visit with Dr Nicki Reaper for Monday am. Warning signs discussed -ER this weekend if worse. Patient verbalized understanding.

## 2015-12-26 NOTE — Telephone Encounter (Signed)
Dr Nicki Reaper advises office visit on Monday- ER this weekend if worse

## 2015-12-28 ENCOUNTER — Encounter (HOSPITAL_COMMUNITY): Payer: Self-pay | Admitting: Emergency Medicine

## 2015-12-28 ENCOUNTER — Emergency Department (HOSPITAL_COMMUNITY)
Admission: EM | Admit: 2015-12-28 | Discharge: 2015-12-28 | Disposition: A | Discharge status: Home / Self Care | Payer: Commercial Managed Care - HMO | Attending: Emergency Medicine | Admitting: Emergency Medicine

## 2015-12-28 ENCOUNTER — Emergency Department (HOSPITAL_COMMUNITY): Payer: Commercial Managed Care - HMO

## 2015-12-28 DIAGNOSIS — F329 Major depressive disorder, single episode, unspecified: Secondary | ICD-10-CM | POA: Diagnosis not present

## 2015-12-28 DIAGNOSIS — R1084 Generalized abdominal pain: Secondary | ICD-10-CM | POA: Diagnosis not present

## 2015-12-28 DIAGNOSIS — I10 Essential (primary) hypertension: Secondary | ICD-10-CM | POA: Diagnosis not present

## 2015-12-28 DIAGNOSIS — Z79899 Other long term (current) drug therapy: Secondary | ICD-10-CM | POA: Insufficient documentation

## 2015-12-28 DIAGNOSIS — Z7984 Long term (current) use of oral hypoglycemic drugs: Secondary | ICD-10-CM | POA: Diagnosis not present

## 2015-12-28 DIAGNOSIS — K5730 Diverticulosis of large intestine without perforation or abscess without bleeding: Secondary | ICD-10-CM | POA: Diagnosis not present

## 2015-12-28 DIAGNOSIS — Z7982 Long term (current) use of aspirin: Secondary | ICD-10-CM | POA: Insufficient documentation

## 2015-12-28 DIAGNOSIS — R197 Diarrhea, unspecified: Secondary | ICD-10-CM | POA: Diagnosis not present

## 2015-12-28 DIAGNOSIS — E119 Type 2 diabetes mellitus without complications: Secondary | ICD-10-CM | POA: Diagnosis not present

## 2015-12-28 LAB — COMPREHENSIVE METABOLIC PANEL
ALK PHOS: 84 U/L (ref 38–126)
ALT: 24 U/L (ref 14–54)
AST: 25 U/L (ref 15–41)
Albumin: 4.1 g/dL (ref 3.5–5.0)
Anion gap: 8 (ref 5–15)
BILIRUBIN TOTAL: 0.5 mg/dL (ref 0.3–1.2)
BUN: 17 mg/dL (ref 6–20)
CALCIUM: 9.7 mg/dL (ref 8.9–10.3)
CO2: 29 mmol/L (ref 22–32)
CREATININE: 0.79 mg/dL (ref 0.44–1.00)
Chloride: 102 mmol/L (ref 101–111)
Glucose, Bld: 114 mg/dL — ABNORMAL HIGH (ref 65–99)
Potassium: 3.6 mmol/L (ref 3.5–5.1)
Sodium: 139 mmol/L (ref 135–145)
TOTAL PROTEIN: 8 g/dL (ref 6.5–8.1)

## 2015-12-28 LAB — CBC WITH DIFFERENTIAL/PLATELET
BASOS ABS: 0 10*3/uL (ref 0.0–0.1)
BASOS PCT: 1 %
EOS ABS: 0.3 10*3/uL (ref 0.0–0.7)
Eosinophils Relative: 3 %
HCT: 42.5 % (ref 36.0–46.0)
HEMOGLOBIN: 13.8 g/dL (ref 12.0–15.0)
Lymphocytes Relative: 43 %
Lymphs Abs: 3.4 10*3/uL (ref 0.7–4.0)
MCH: 25 pg — ABNORMAL LOW (ref 26.0–34.0)
MCHC: 32.5 g/dL (ref 30.0–36.0)
MCV: 76.9 fL — ABNORMAL LOW (ref 78.0–100.0)
MONOS PCT: 9 %
Monocytes Absolute: 0.7 10*3/uL (ref 0.1–1.0)
NEUTROS PCT: 44 %
Neutro Abs: 3.4 10*3/uL (ref 1.7–7.7)
Platelets: 304 10*3/uL (ref 150–400)
RBC: 5.53 MIL/uL — ABNORMAL HIGH (ref 3.87–5.11)
RDW: 16.4 % — ABNORMAL HIGH (ref 11.5–15.5)
WBC: 7.8 10*3/uL (ref 4.0–10.5)

## 2015-12-28 LAB — URINALYSIS, ROUTINE W REFLEX MICROSCOPIC
BILIRUBIN URINE: NEGATIVE
Glucose, UA: NEGATIVE mg/dL
Hgb urine dipstick: NEGATIVE
KETONES UR: NEGATIVE mg/dL
Leukocytes, UA: NEGATIVE
NITRITE: NEGATIVE
Protein, ur: NEGATIVE mg/dL
SPECIFIC GRAVITY, URINE: 1.01 (ref 1.005–1.030)
pH: 6 (ref 5.0–8.0)

## 2015-12-28 LAB — I-STAT CG4 LACTIC ACID, ED: Lactic Acid, Venous: 1.36 mmol/L (ref 0.5–2.0)

## 2015-12-28 MED ORDER — ONDANSETRON HCL 4 MG/2ML IJ SOLN
4.0000 mg | Freq: Once | INTRAMUSCULAR | Status: AC
  Administered 2015-12-28: 4 mg via INTRAVENOUS
  Filled 2015-12-28: qty 2

## 2015-12-28 MED ORDER — SODIUM CHLORIDE 0.9 % IV BOLUS (SEPSIS)
500.0000 mL | Freq: Once | INTRAVENOUS | Status: AC
  Administered 2015-12-28: 500 mL via INTRAVENOUS

## 2015-12-28 MED ORDER — METRONIDAZOLE 500 MG PO TABS: 500.0000 mg | ORAL_TABLET | Freq: Two times a day (BID) | ORAL | Status: DC

## 2015-12-28 MED ORDER — DIATRIZOATE MEGLUMINE & SODIUM 66-10 % PO SOLN
ORAL | Status: AC
  Administered 2015-12-28: 30 mL
  Filled 2015-12-28: qty 30

## 2015-12-28 MED ORDER — PROMETHAZINE HCL 25 MG PO TABS: 25.0000 mg | ORAL_TABLET | Freq: Four times a day (QID) | ORAL | Status: DC | PRN

## 2015-12-28 MED ORDER — HYDROCODONE-ACETAMINOPHEN 5-325 MG PO TABS: 2.0000 | ORAL_TABLET | ORAL | Status: DC | PRN

## 2015-12-28 MED ORDER — HYDROMORPHONE HCL 1 MG/ML IJ SOLN
1.0000 mg | Freq: Once | INTRAMUSCULAR | Status: AC
  Administered 2015-12-28: 1 mg via INTRAVENOUS
  Filled 2015-12-28: qty 1

## 2015-12-28 MED ORDER — CIPROFLOXACIN HCL 500 MG PO TABS: 500.0000 mg | ORAL_TABLET | Freq: Two times a day (BID) | ORAL | Status: DC

## 2015-12-28 MED ORDER — IOPAMIDOL (ISOVUE-300) INJECTION 61%
INTRAVENOUS | Status: AC
  Administered 2015-12-28: 100 mL
  Filled 2015-12-28: qty 100

## 2015-12-28 NOTE — ED Notes (Signed)
Having abdominal cramping for last 7 days.  Rates pain 8/10.  Had 5 loose stools in last 24 hours.  C/o abdominal cramping when bowels are moving.  Stools are watery and brown in color.  C/o weakness since Friday.

## 2015-12-28 NOTE — ED Provider Notes (Signed)
CSN: [id number]     Arrival date & time 12/28/15  1054 History  By signing my name below, I, Doran Stabler, attest that this documentation has been prepared under the direction and in the presence of Noemi Chapel, MD. Electronically Signed: Doran Stabler, ED Scribe. 12/28/2015. 11:50 AM.   Chief Complaint  Patient presents with  . Abdominal Cramping   The history is provided by the patient. No language interpreter was used.   HPI Comments: [name] is a 70 y.o. female who presents to the Emergency Department with a PMHx of HTN, hepatic cyst, DM, and IBS complaining of constant diffuse abdominal pain that began 3 days ago. Pt also reports frequent BM. She states her stool was normal at first but states due to her frequent BMs, her stools have recently been watery. Pt denies any fevers, chills, CP, SOB, diarrhea, dysuria, blood in stool, or any other symptoms at this time.    Past Medical History  Diagnosis Date  . Hypertension   . Diverticulitis   . Cyst on liver  . Blood clot of artery under arm ([place])   . Diabetes mellitus     controlled by diet  . Menopause   . Fibrocystic breast disease   . Impaired glucose tolerance   . Hepatic cyst   . IBS (irritable bowel syndrome)   . DVT of axillary vein, acute right 11/24/2012    Dx on Jan 28, 2012.    [name] Depression    Past Surgical History  Procedure Laterality Date  . Back surgery    . Cholecystectomy    . Knee arthrocentesis  left  . Breast surgery  right    cyst removed  . Abdominal hysterectomy    . Tubal ligation    . Cast application  [id number]    Procedure: CAST APPLICATION;  Surgeon: Carole Civil, MD;  Location: [facility];  Service: Orthopedics;  Laterality: Right;  procedure room   . Colonoscopy  12/04/2004    NUR:Few small diverticula at sigmoid colon/Small cecal polyp ablated   . Colonoscopy N/A 09/24/2014    SLF: small internal hemorrhoids   Family History  Problem Relation Age of Onset  . Stroke  Mother   . Heart attack Mother   . Cancer Sister   . Diabetes Sister   . Seizures Brother   . Diabetes Brother   . Heart disease Brother   . Colon cancer Neg Hx    Social History  Substance Use Topics  . Smoking status: Never Smoker   . Smokeless tobacco: Never Used  . Alcohol Use: No   OB History    Gravida Para Term Preterm AB TAB SAB Ectopic Multiple Living   3 3 3       3      Review of Systems  Constitutional: Negative for fever and chills.  Respiratory: Negative for shortness of breath.   Cardiovascular: Negative for chest pain.  Gastrointestinal: Positive for abdominal pain and diarrhea. Negative for nausea, vomiting and blood in stool.  Genitourinary: Negative for dysuria.  All other systems reviewed and are negative.   Allergies  Norvasc; Penicillins; Advair diskus; and Metformin and related  Home Medications   Prior to Admission medications   Medication Sig Start Date End Date Taking? Authorizing Provider  acetaminophen (TYLENOL) 500 MG tablet Take 500 mg by mouth every 6 (six) hours as needed.   Yes Historical Provider, MD  aspirin 81 MG tablet Take 81 mg by mouth daily.  Yes Historical Provider, MD  Biotin w/ Vitamins C & E (HAIR SKIN & NAILS GUMMIES PO) Take 1 each by mouth 2 (two) times daily.   Yes Historical Provider, MD  Cholecalciferol (VITAMIN D3 PO) Take 1,000 Units by mouth daily.   Yes Historical Provider, MD  glipiZIDE (GLUCOTROL) 5 MG tablet Take 1 tab po q am and 1/2 tab po suppertime 06/25/15  Yes [name], MD  hydrochlorothiazide (HYDRODIURIL) 12.5 MG tablet Take 1 tablet (12.5 mg total) by mouth daily. 07/16/15  Yes [name], MD  Linaclotide (LINZESS) 145 MCG CAPS capsule Take 1 capsule (145 mcg total) by mouth daily. 10/28/15  Yes Orvil Feil, NP  losartan (COZAAR) 50 MG tablet Take 1 tablet (50 mg total) by mouth daily. 07/02/15  Yes [name], MD  Multiple Vitamins-Minerals (MULTIVITAMIN WITH MINERALS) tablet Take 1 tablet by  mouth daily.   Yes Historical Provider, MD  omeprazole (PRILOSEC) 20 MG capsule Take 1 capsule (20 mg total) by mouth daily as needed. Patient taking differently: Take 20 mg by mouth daily.  12/16/15  Yes [name], MD  pravastatin (PRAVACHOL) 20 MG tablet TAKE 1 TABLET EVERY DAY 10/27/15  Yes [name], MD  traZODone (DESYREL) 50 MG tablet Take one half to one tablet qhs Patient taking differently: Take 25-50 mg by mouth at bedtime as needed for sleep. Take one half to one tablet qhs 09/30/15  Yes [name], MD  ciprofloxacin (CIPRO) 500 MG tablet Take 1 tablet (500 mg total) by mouth every 12 (twelve) hours. 12/28/15   Noemi Chapel, MD  HYDROcodone-acetaminophen (NORCO/VICODIN) 5-325 MG tablet Take 2 tablets by mouth every 4 (four) hours as needed. 12/28/15   Noemi Chapel, MD  metroNIDAZOLE (FLAGYL) 500 MG tablet Take 1 tablet (500 mg total) by mouth 2 (two) times daily. 12/28/15   Noemi Chapel, MD  promethazine (PHENERGAN) 25 MG tablet Take 1 tablet (25 mg total) by mouth every 6 (six) hours as needed for nausea or vomiting. 12/28/15   Noemi Chapel, MD   BP 141/57 mmHg  Pulse 43  Temp(Src) 97.9 F (36.6 C) (Oral)  Resp 16  Ht 5\' 5"  (1.651 m)  Wt 252 lb (114.306 kg)  BMI 41.93 kg/m2  SpO2 98%   Physical Exam  Constitutional: She appears well-developed and well-nourished. No distress.  HENT:  Head: Normocephalic and atraumatic.  Mouth/Throat: Oropharynx is clear and moist. No oropharyngeal exudate.  Eyes: Conjunctivae and EOM are normal. Pupils are equal, round, and reactive to light. Right eye exhibits no discharge. Left eye exhibits no discharge. No scleral icterus.  Neck: Normal range of motion. Neck supple. No JVD present. No thyromegaly present.  Cardiovascular: Normal rate, regular rhythm, normal heart sounds and intact distal pulses.  Exam reveals no gallop and no friction rub.   No murmur heard. Pulmonary/Chest: Effort normal and breath sounds normal. No respiratory  distress. She has no wheezes. She has no rales.  Abdominal: Soft. Bowel sounds are normal. She exhibits no distension and no mass. There is tenderness (diffuse, mild).  Musculoskeletal: Normal range of motion. She exhibits no edema or tenderness.  Lymphadenopathy:    She has no cervical adenopathy.  Neurological: She is alert. Coordination normal.  Skin: Skin is warm and dry. No rash noted. No erythema.  Psychiatric: She has a normal mood and affect. Her behavior is normal.  Nursing note and vitals reviewed.   ED Course  Procedures  DIAGNOSTIC STUDIES: Oxygen Saturation is  100% on room air, normal by my interpretation.    COORDINATION OF CARE: 11:44 AM Will order CTA abdomen/pelvis, blood work, urinalysis, and urine culture. Discussed treatment plan with pt at bedside and pt agreed to plan.  Labs Review Labs Reviewed  COMPREHENSIVE METABOLIC PANEL - Abnormal; Notable for the following:    Glucose, Bld 114 (*)    All other components within normal limits  CBC WITH DIFFERENTIAL/PLATELET - Abnormal; Notable for the following:    RBC 5.53 (*)    MCV 76.9 (*)    MCH 25.0 (*)    RDW 16.4 (*)    All other components within normal limits  URINALYSIS, ROUTINE W REFLEX MICROSCOPIC (NOT AT [facility] [place]) - Abnormal; Notable for the following:    APPearance HAZY (*)    All other components within normal limits  URINE CULTURE  I-STAT CG4 LACTIC ACID, ED    Imaging Review Ct Abdomen Pelvis W Contrast  12/28/2015  CLINICAL DATA:  Acute generalized abdominal pain. EXAM: CT ABDOMEN AND PELVIS WITH CONTRAST TECHNIQUE: Multidetector CT imaging of the abdomen and pelvis was performed using the standard protocol following bolus administration of intravenous contrast. CONTRAST:  1 ISOVUE-300 IOPAMIDOL (ISOVUE-300) INJECTION 61% COMPARISON:  CT scan of June 21, 2014. 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 Visualized lung bases are unremarkable. Status post  cholecystectomy. Stable hepatic cysts are noted including dominant cyst in right hepatic lobe. The spleen and pancreas are unremarkable. Adrenal glands and kidneys appear normal. No hydronephrosis or renal obstruction is noted. Nonobstructive calculus is seen in lower pole collecting system of left kidney. No ureteral calculi are noted. The appendix appears normal. There is no evidence of bowel obstruction. Diverticulosis of descending and sigmoid colon is noted without inflammation. No abnormal fluid collection is noted. Urinary bladder appears normal. Status post hysterectomy. Ovaries are not clearly visualized. No significant adenopathy is noted. IMPRESSION: Stable hepatic cysts. Nonobstructive left renal calculus. No hydronephrosis or renal obstruction is noted. Sigmoid and descending diverticulosis is noted without inflammation. No other significant abnormality seen in the abdomen or pelvis. Electronically Signed   By: Marijo Conception, M.D.   On: 12/28/2015 14:56   I have personally reviewed and evaluated these images and lab results as part of my medical decision-making.    MDM   Final diagnoses:  Generalized abdominal pain    I personally performed the services described in this documentation, which was scribed in my presence. The recorded information has been reviewed and is accurate.   The pt has had improved sx after meds  CT negative for acute findings inlcuding infection / perforation Labs unremarkable Lactic and WBC n ormal She has no light headedness and has pulse of 55-65  on my exam each time I'm in the room.She is in no distress and can continue this w/u outpatient Pt expressed her understanding to all findings and agreed with the plan.  Meds given in ED:  Medications  sodium chloride 0.9 % bolus 500 mL (0 mLs Intravenous Stopped 12/28/15 1352)  HYDROmorphone (DILAUDID) injection 1 mg (1 mg Intravenous Given 12/28/15 1217)  iopamidol (ISOVUE-300) 61 % injection (100 mLs   Contrast Given 12/28/15 1425)  diatrizoate meglumine-sodium (GASTROGRAFIN) 66-10 % solution (30 mLs  Given 12/28/15 1237)  ondansetron (ZOFRAN) injection 4 mg (4 mg Intravenous Given 12/28/15 1352)    New Prescriptions   CIPROFLOXACIN (CIPRO) 500 MG TABLET    Take 1 tablet (500 mg total) by mouth every 12 (twelve)  hours.   HYDROCODONE-ACETAMINOPHEN (NORCO/VICODIN) 5-325 MG TABLET    Take 2 tablets by mouth every 4 (four) hours as needed.   METRONIDAZOLE (FLAGYL) 500 MG TABLET    Take 1 tablet (500 mg total) by mouth 2 (two) times daily.   PROMETHAZINE (PHENERGAN) 25 MG TABLET    Take 1 tablet (25 mg total) by mouth every 6 (six) hours as needed for nausea or vomiting.        Noemi Chapel, MD 12/28/15 [phone]

## 2015-12-28 NOTE — ED Notes (Signed)
Patient taken to car via wheelchair. When patient got to car she states he became dizzy and felt like she was going to pass out.  Patient brought back to room and placed in bed. Dr. Wilson Singer made aware. Per Dr. Wilson Singer allow patient to stay longer to see if she feels better. Patient given crackers and sprite to drink. Husband at bedside.

## 2015-12-28 NOTE — ED Notes (Signed)
Patient voices she feels better after eating and drinking. Per Dr. Wilson Singer, patient is cleared to go home. Patient stable at d/c. Left with husband. Instructed to come back for evaluation with any changes or worsening. Verbalizes understanding.

## 2015-12-28 NOTE — ED Notes (Signed)
Dr. Sabra Heck made aware of patients heart rate. No new orders given.

## 2015-12-28 NOTE — Discharge Instructions (Signed)
Your CT scan didn't show any specific findings Please call [name] to arrange follow up for early this week (2 days or less) Return to the ER if you are having worsening symptoms.  Please obtain all of your results from medical records or have your doctors office obtain the results - share them with your doctor - you should be seen at your doctors office in the next 2 days. Call today to arrange your follow up. Take the medications as prescribed. Please review all of the medicines and only take them if you do not have an allergy to them. Please be aware that if you are taking birth control pills, taking other prescriptions, ESPECIALLY ANTIBIOTICS may make the birth control ineffective - if this is the case, either do not engage in sexual activity or use alternative methods of birth control such as condoms until you have finished the medicine and your family doctor says it is OK to restart them. If you are on a blood thinner such as COUMADIN, be aware that any other medicine that you take may cause the coumadin to either work too much, or not enough - you should have your coumadin level rechecked in next 7 days if this is the case.  ?  It is also a possibility that you have an allergic reaction to any of the medicines that you have been prescribed - Everybody reacts differently to medications and while MOST people have no trouble with most medicines, you may have a reaction such as nausea, vomiting, rash, swelling, shortness of breath. If this is the case, please stop taking the medicine immediately and contact your physician.  ?  You should return to the ER if you develop severe or worsening symptoms.

## 2015-12-29 ENCOUNTER — Ambulatory Visit (INDEPENDENT_AMBULATORY_CARE_PROVIDER_SITE_OTHER): Payer: Commercial Managed Care - HMO | Admitting: Family Medicine

## 2015-12-29 ENCOUNTER — Encounter (HOSPITAL_COMMUNITY): Payer: Commercial Managed Care - HMO | Attending: Hematology & Oncology | Admitting: Hematology & Oncology

## 2015-12-29 ENCOUNTER — Encounter (HOSPITAL_COMMUNITY): Payer: Self-pay | Admitting: Hematology & Oncology

## 2015-12-29 ENCOUNTER — Encounter: Payer: Self-pay | Admitting: Family Medicine

## 2015-12-29 VITALS — BP 132/74 | Temp 98.0°F | Ht 65.0 in | Wt 249.0 lb

## 2015-12-29 VITALS — BP 149/57 | HR 50 | Temp 98.0°F | Resp 18 | Wt 250.1 lb

## 2015-12-29 DIAGNOSIS — D563 Thalassemia minor: Secondary | ICD-10-CM | POA: Diagnosis not present

## 2015-12-29 DIAGNOSIS — Z86718 Personal history of other venous thrombosis and embolism: Secondary | ICD-10-CM

## 2015-12-29 DIAGNOSIS — K589 Irritable bowel syndrome without diarrhea: Secondary | ICD-10-CM | POA: Diagnosis not present

## 2015-12-29 DIAGNOSIS — I82A11 Acute embolism and thrombosis of right axillary vein: Secondary | ICD-10-CM

## 2015-12-29 MED ORDER — DIPHENOXYLATE-ATROPINE 2.5-0.025 MG PO TABS: 1.0000 | ORAL_TABLET | Freq: Four times a day (QID) | ORAL | Status: DC | PRN

## 2015-12-29 MED ORDER — DICYCLOMINE HCL 20 MG PO TABS: 20.0000 mg | ORAL_TABLET | Freq: Three times a day (TID) | ORAL | Status: DC | PRN

## 2015-12-29 MED ORDER — ONDANSETRON 8 MG PO TBDP: 8.0000 mg | ORAL_TABLET | Freq: Three times a day (TID) | ORAL | Status: DC | PRN

## 2015-12-29 NOTE — Patient Instructions (Signed)
The diarrhea and abdominal discomfort I believe is being due to irritable bowel syndrome flareup. I would not recommend antibiotics at this time. I would recommend using Lomotil 1 tablet up to 4 times per day as needed for the diarrhea. Once the diarrhea checks up I would stop using this medication. Hold off on Linzess until you're underlying issues correct.  You may use dicyclomine one 3 times a day as needed for the intestinal cramping. You may also use Zofran dissolvable tablet up to 3 times a day as needed for any nausea.  Should you start having bloody bowel movements or fevers please let us know. It may take 5-6 days for this to settle down. Certainly let us know if things are getting worse. It would be reasonable to take probiotic one per day. Please follow-up if ongoing troubles or if worse.

## 2015-12-29 NOTE — Progress Notes (Signed)
Kathleen Lange, MD [address] [place] [address]  No diagnosis found.  CURRENT THERAPY: Surveillance  INTERVAL HISTORY: [name] 70 y.o. female returns for followup of DVT of the right axillary vein in the setting of Coumadin anticoagulation, typically therapeutic. She is S/P 12 months of anticoagulation with xarelto, finishing on 03/12/2013. She was seen in consultation at [facility] and it was recommended that she discontinue ongoing anticoagulation. She has done well.   [name] was here with her husband today.  She went to the emergency room yesterday for abdominal pain. She said that she wasn't around anyone sick and doesn't remember eating any bad food. Her abdominal pain is better today but still hurts. She also notes that her diarrhea is improved.   She has had no swelling in her hands, feet or legs. She denies any changes in her breathing.   She is up to date on her mammograms.  She has no other concerns or questions at this time.   Past Medical History  Diagnosis Date  . Hypertension   . Diverticulitis   . Cyst on liver  . Blood clot of artery under arm ([place])   . Diabetes mellitus     controlled by diet  . Menopause   . Fibrocystic breast disease   . Impaired glucose tolerance   . Hepatic cyst   . IBS (irritable bowel syndrome)   . DVT of axillary vein, acute right 11/24/2012    Dx on Jan 28, 2012.    [name] Depression     has HYPERCHOLESTEROLEMIA; DEPRESSION; Essential hypertension; PEPTIC ULCER DISEASE; HIATAL HERNIA; HEPATIC CYST; OSTEOARTHRITIS; ABDOMINAL PAIN, CHRONIC; Chest pain, unspecified; Bradycardia; Giddiness; Diabetes mellitus type 2, diet-controlled ([place]); DVT of axillary vein, acute right; Diabetes type 2, uncontrolled ([place]); Abdominal pain; Decreased appetite; Unintentional weight loss; AP (abdominal pain); Loss of weight; and Constipation on her problem list.     is allergic to norvasc; penicillins; advair diskus;  and metformin and related.  Ms. Koetz does not currently have medications on file.  Past Surgical History  Procedure Laterality Date  . Back surgery    . Cholecystectomy    . Knee arthrocentesis  left  . Breast surgery  right    cyst removed  . Abdominal hysterectomy    . Tubal ligation    . Cast application  [id number]    Procedure: CAST APPLICATION;  Surgeon: Carole Civil, MD;  Location: [facility];  Service: Orthopedics;  Laterality: Right;  procedure room   . Colonoscopy  12/04/2004    NUR:Few small diverticula at sigmoid colon/Small cecal polyp ablated   . Colonoscopy N/A 09/24/2014    SLF: small internal hemorrhoids   Positive for abdominal pain.  Denies any headaches, dizziness, double vision, fevers, chills, night sweats, nausea, vomiting, diarrhea, constipation, chest pain, heart palpitations, shortness of breath, blood in stool, black tarry stool, urinary pain, urinary burning, urinary frequency, hematuria.  14 point review of systems was performed and is negative except as detailed under history of present illness and above  PHYSICAL EXAMINATION  ECOG PERFORMANCE STATUS: 1 - Symptomatic but completely ambulatory  Filed Vitals:   12/29/15 0919  BP: 149/57  Pulse: 50  Temp: 98 F (36.7 C)  Resp: 18    GENERAL:alert, no distress, well nourished, well developed, comfortable, cooperative, obese and smiling SKIN: skin color, texture, turgor are normal, no rashes or significant lesions HEAD: Normocephalic, No masses, lesions, tenderness or abnormalities EYES:  normal, PERRLA, EOMI, Conjunctiva are pink and non-injected EARS: External ears normal OROPHARYNX:lips, buccal mucosa, and tongue normal and mucous membranes are moist  NECK: supple, no adenopathy, thyroid normal size, non-tender, without nodularity, no stridor, non-tender, trachea midline LYMPH:  no palpable lymphadenopathy BREAST:not examined LUNGS: clear to auscultation  HEART: regular rate & rhythm,  no murmurs and no gallops ABDOMEN:abdomen soft, non-tender, obese and normal bowel sounds BACK: Back symmetric, no curvature., No CVA tenderness EXTREMITIES:less then 2 second capillary refill, no skin discoloration, no clubbing, no cyanosis, positive findings:  Right upper right extremity proximal to olecranon mild edema with some minimal tenderness to palpation on anterior aspect of deltoid without erythema or heat.  NEURO: alert & oriented x 3 with fluent speech, no focal motor/sensory deficits, gait normal   RADIOGRAPHIC STUDIES: I have personally reviewed the radiological images as listed and agreed with the findings in the report.  Study Result     CLINICAL DATA: Acute generalized abdominal pain.  EXAM: CT ABDOMEN AND PELVIS WITH CONTRAST  TECHNIQUE: Multidetector CT imaging of the abdomen and pelvis was performed using the standard protocol following bolus administration of intravenous contrast.  CONTRAST: 1 ISOVUE-300 IOPAMIDOL (ISOVUE-300) INJECTION 61%  COMPARISON: CT scan of June 21, 2014.  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 Visualized lung bases are unremarkable.  Status post cholecystectomy. Stable hepatic cysts are noted including dominant cyst in right hepatic lobe. The spleen and pancreas are unremarkable. Adrenal glands and kidneys appear normal. No hydronephrosis or renal obstruction is noted. Nonobstructive calculus is seen in lower pole collecting system of left kidney. No ureteral calculi are noted. The appendix appears normal. There is no evidence of bowel obstruction. Diverticulosis of descending and sigmoid colon is noted without inflammation. No abnormal fluid collection is noted. Urinary bladder appears normal. Status post hysterectomy. Ovaries are not clearly visualized. No significant adenopathy is noted.  IMPRESSION: Stable hepatic cysts.  Nonobstructive left renal calculus. No  hydronephrosis or renal obstruction is noted.  Sigmoid and descending diverticulosis is noted without inflammation.  No other significant abnormality seen in the abdomen or pelvis.   Electronically Signed  By: Marijo Conception, M.D.  On: 12/28/2015 14:56    Study Result     EXAM: DUAL X-RAY ABSORPTIOMETRY (DXA) FOR BONE MINERAL DENSITY  IMPRESSION: Ordering Physician: Dr. [name],  Your patient [name] completed a BMD test on 11/17/2015 using the Maceo (software version: 14.10) manufactured by [employer]. The following summarizes the results of our evaluation. PATIENT BIOGRAPHICAL: Name: [name], [name] Patient ID: [id number] Birth Date: February 25, 1946 Height: 65.0 in. Gender: Female Exam Date: 11/17/2015 Weight: 252.0 lbs. Indications: Bilateral Oophrectomy, History of Fracture (Adult), Low Calcium Intake, Back surgery, Post Menopausal Fractures: Ankle Treatments: Vitamin D DENSITOMETRY RESULTS: Site Region Measured Date Measured Age WHO Classification Young Adult T-score BMD %Change vs. Previous Significant Change (*) DualFemur Neck Left 11/17/2015 67.7 Normal -0.5 0.971 g/cm2 -2.6% - DualFemur Neck Left 11/15/2013 65.7 Normal -0.3 0.997 g/cm2 - -  Left Forearm Radius 33% 11/17/2015 67.7 Normal 0.4 0.745 g/cm2 -1.0% - Left Forearm Radius 33% 11/15/2013 65.7 Normal 0.5 0.753 g/cm2 - - ASSESSMENT: BMD as determined from Femur Neck Left is 0.971 g/cm2 with a T-Score of -0.5. This patient is considered normal according to [facility]) criteria. Compared with the prior study on 11/15/2013, the BMD of the left femoral neck and left forearm shows no statistically significant change. (Lumbar spine  was not utilized due to advanced degenerative changes.) (Patient does not meet criteria for FRAX assessment.)  World Health Organization [facility]) criteria for  post-menopausal, Caucasian Women: Normal: T-score at or above -1 SD Osteopenia: T-score between -1 and -2.5 SD Osteoporosis: T-score at or below -2.5 SD  RECOMMENDATIONS: [facility] recommends that FDA-approved medial therapies be considered in postmenopausal women and men age 66 or older with a: 1. Hip or vertebral (clinical or morphometric) fracture. 2. T-Score of < -2.5 at the spine or hip. 3. Ten-year fracture probability by FRAX of 3% or greater for hip fracture or 20% or greater for major osteoporotic fracture.  All treatment decisions require clinical judgment and consideration of individual patient factors, including patient preferences, co-morbidities, previous drug use, risk factors not captured in the FRAX model (e.g. falls, vitamin D deficiency, increased bone turnover, interval significant decline in bone density) and possible under-or over-estimation of fracture risk by FRAX.  All patients should ensure an adequate intake of dietary calcium (1200 mg/d) and vitamin D (800 IU daily) unless contraindicated.  FOLLOW-UP: People with diagnosed cases of osteoporosis or osteopenia should be regularly tested for bone mineral density. For patients eligible for Medicare, routine testing is allowed once every 2 years. Testing frequency can be increased for patients who have rapidly progressing disease, or for those who are receiving medical therapy to restore bone mass.  I have reviewed this report, and agree with the above findings.  [facility]   Electronically Signed  By: David Martinique M.D.  On: 11/17/2015 10:35    *RADIOLOGY REPORT*  Clinical Data: Right upper extremity DVT  RIGHT UPPER EXTREMITY VENOUS DUPLEX ULTRASOUND  Technique: Gray-scale sonography with graded compression, as well as color Doppler and duplex ultrasound were performed to evaluate the upper extremity deep venous system from the  level of the subclavian vein and including the jugular, axillary, basilic and upper cephalic vein. Spectral Doppler was utilized to evaluate flow at rest and with distal augmentation maneuvers.  Comparison: 01/28/2012  Findings: The right internal jugular vein remains widely patent. Right subclavian vein demonstrates a new echogenic intraluminal thrombus appearing occlusive and noncompressible compatible with interval development of right subclavian DVT. Right axillary nonocclusive DVT demonstrates interval near complete resolution. Peripherally, the right cephalic, basilic, brachial, radial and ulnar veins are all patent with normal compressibility and phasicity. Augmentation not performed.  IMPRESSION: Interval near complete resolution of the right axillary nonocclusive DVT  Unfortunately, there has been development of new thrombus in the right subclavian vein more centrally which appears occlusive.   Original Report Authenticated By: Jerilynn Mages. Shick, M.D      LABORATORY DATA: I have reviewed the data as listed  CBC    Component Value Date/Time   WBC 7.8 12/28/2015 1220   WBC 7.2 09/30/2015 1035   RBC 5.53* 12/28/2015 1220   RBC 5.39* 09/30/2015 1035   HGB 13.8 12/28/2015 1220   HCT 42.5 12/28/2015 1220   HCT 41.0 09/30/2015 1035   PLT 304 12/28/2015 1220   PLT 316 09/30/2015 1035   MCV 76.9* 12/28/2015 1220   MCV 76* 09/30/2015 1035   MCH 25.0* 12/28/2015 1220   MCH 24.9* 09/30/2015 1035   MCHC 32.5 12/28/2015 1220   MCHC 32.7 09/30/2015 1035   RDW 16.4* 12/28/2015 1220   RDW 17.1* 09/30/2015 1035   LYMPHSABS 3.4 12/28/2015 1220   LYMPHSABS 4.0* 09/30/2015 1035   MONOABS 0.7 12/28/2015 1220   EOSABS 0.3 12/28/2015 1220   EOSABS 0.2 09/30/2015 1035  BASOSABS 0.0 12/28/2015 1220   BASOSABS 0.0 09/30/2015 1035      Chemistry      Component Value Date/Time   NA 139 12/28/2015 1220   NA 141 09/30/2015 1035   K 3.6 12/28/2015 1220   CL 102  12/28/2015 1220   CO2 29 12/28/2015 1220   BUN 17 12/28/2015 1220   BUN 16 09/30/2015 1035   CREATININE 0.79 12/28/2015 1220   CREATININE 0.79 06/19/2014 0833      Component Value Date/Time   CALCIUM 9.7 12/28/2015 1220   ALKPHOS 84 12/28/2015 1220   AST 25 12/28/2015 1220   ALT 24 12/28/2015 1220   BILITOT 0.5 12/28/2015 1220   BILITOT 0.4 03/26/2015 0811     Lab Results  Component Value Date   DDIMER 0.36 09/03/2015   Results for [name], [name] (MRN [id number]) as of 12/29/2015 08:17  ASSESSMENT AND PLAN:  History of DVT RUE Beta Thalassemia Trait   Continue surveillance.  She is probably appropriate for discharge from the [facility] if no problems when she returns in 6 months.   We discussed this today and she will move out to a yearly followup then discharge after that point if she is doing well.  She was [facility] at [facility] by Dr. Joan Flores:  Venous thromboembolism: First episode, reportedly "mild" right arm axillary DVT diagnosed 01/26/2012 (report available to me) after more than 1 year (probably > 2 yrs) of symptoms that had worsened for the preceding few weeks in 12-2011. VTE risk factors:(a) obesity. Nothing else. Thrombophilia workup negative. On warfarin thereafter until on 11/28/2012 when a second episode of VTE was reportedly diagnosed-no new symptoms at that time: Reportedly new Right subclavian vein DVT (report available to me), but almost complete resolution of the axillary vein clot. Switched from warfarin to Xarelto at that time. It is somewhat surprising that the patient would have developed a new arm DVT (right subclavian vein) while on full dose anticoagulation and without any worsening of symptoms, and also that she would have dissolved the other clot at the same time. Thus, I wonder whether the reading of the ultrasound was discrepant due to different technique or technologist. On Xarelto until June 2014- Negative d-dimer led to discontinuation of Xarelto. Negative d-dimer  today suggests low wish risk for recurrent VTE.  The Doppler ultrasound at [facility] on 02/06/2013 clearly shows evidence of old axillary vein DVT; the venogram however shows completely patent venous system in the right arm. I conclude that the patient has had a right axillary DVT at some point in the past, approximately 3 years ago. By history this appears to have been an unprovoked event. I am not convinced that she had a recurrence on 11/28/2012 and am rather doubtful that she did, as she had no new symptoms in April 2014, and as she is obese which decreases the sensitivity of Doppler ultrasound to detect new clot and increases the false positive rate.   PLAN 1. Continue without blood thinners. 2. No follow-up with me was arranged  Odis Hollingshead, MD Professor Department of Medicine, Division of Hematology-Oncology [place] [facility] [address] [place], [place] [address] [email or web address]  She is up to date on her mammograms.  She has no other concerns or questions at this time.   All questions were answered. The patient knows to call the clinic with any problems, questions or concerns. We can certainly see the patient much sooner if necessary.  This document serves as a record  of services personally performed by Ancil Linsey, MD. It was created on her behalf by Kandace Blitz, a trained medical scribe. The creation of this record is based on the scribe's personal observations and the provider's statements to them. This document has been checked and approved by the attending provider.  I have reviewed the above documentation for accuracy and completeness, and I agree with the above.  This note is electronically signed by:  Molli Hazard, MD  12/29/2015 11:18 AM

## 2015-12-29 NOTE — Patient Instructions (Signed)
[place] at [facility] Discharge Instructions  RECOMMENDATIONS MADE BY THE CONSULTANT AND ANY TEST RESULTS WILL BE SENT TO YOUR REFERRING PHYSICIAN.  Exam per Dr. Whitney Muse  We will follow up with you every year   Thank you for choosing [place] at [facility] to provide your oncology and hematology care.  To afford each patient quality time with our provider, please arrive at least 15 minutes before your scheduled appointment time.   Beginning January 23rd 2017 lab work for the [facility] will be done in the  Main lab at [facility] on 1st floor. If you have a lab appointment with the [facility] please come in thru the  Main Entrance and check in at the main information desk  You need to re-schedule your appointment should you arrive 10 or more minutes late.  We strive to give you quality time with our providers, and arriving late affects you and other patients whose appointments are after yours.  Also, if you no show three or more times for appointments you may be dismissed from the clinic at the providers discretion.     Again, thank you for choosing [facility].  Our hope is that these requests will decrease the amount of time that you wait before being seen by our physicians.       _____________________________________________________________  Should you have questions after your visit to [facility], please contact our office at [phone] between the hours of 8:30 a.m. and 4:30 p.m.  Voicemails left after 4:30 p.m. will not be returned until the following business day.  For prescription refill requests, have your pharmacy contact our office.         Resources For Cancer Patients and their Caregivers ? American Cancer Society: Can assist with transportation, wigs, general needs, runs Look Good Feel Better.        (303) 726-3768 ? Cancer Care: Provides financial assistance, online support groups,  medication/co-pay assistance.  1-800-813-HOPE 443-010-5904) ? [facility] Assists [place] cancer patients and their families through emotional , educational and financial support.  431-176-7774 ? [place] DSS Where to apply for food stamps, Medicaid and utility assistance. 786-266-9474 ? [employer]: Transportation to medical appointments. (319) 840-1796 ? Social Security Administration: May apply for disability if have a Stage IV cancer. 813-625-1410 314-576-2040 ? [employer], Disability and Transit Services: Assists with nutrition, care and transit needs. 206-438-5598

## 2015-12-29 NOTE — Progress Notes (Signed)
   Subjective:    Patient ID: [name], female    DOB: 13-Oct-1945, 70 y.o.   MRN: [id number]  Abdominal Pain This is a new problem. The current episode started in the past 7 days. The problem occurs intermittently. The problem has been unchanged. The pain is located in the generalized abdominal region. The pain is moderate. The quality of the pain is cramping. The abdominal pain does not radiate. Associated symptoms include diarrhea. Pertinent negatives include no fever. Nothing aggravates the pain. The pain is relieved by nothing. She has tried nothing for the symptoms. The treatment provided no relief.   Patient was seen at [facility] ER yesterday for this issue. Notes in the system.    she states she has severe abdominal cramps loose stools denies any high fever chills or sweats she states that she went to the ER they did a scan on her they did blood work on her then they released her and she threw up when they brought her back in they went ahead and put her on antibiotics cause a told her she might have an infection   There is no evidence of diverticulitis on CAT scan her white blood count is normal  Review of Systems  Constitutional: Negative for fever, chills and fatigue.  Respiratory: Negative for cough and shortness of breath.   Gastrointestinal: Positive for abdominal pain and diarrhea.       Objective:   Physical Exam  Constitutional: She appears well-nourished. No distress.  Cardiovascular: Normal rate, regular rhythm and normal heart sounds.   No murmur heard. Pulmonary/Chest: Effort normal and breath sounds normal. No respiratory distress.  Abdominal: Soft. She exhibits no distension and no mass. There is tenderness. There is no rebound and no guarding.  Musculoskeletal: She exhibits no edema.  Lymphadenopathy:    She has no cervical adenopathy.  Neurological: She is alert. She exhibits normal muscle tone.  Psychiatric: Her behavior is normal.  Vitals  reviewed.         Assessment & Plan:   SEVERE ibs- ANTI-SPASMODICS OVER THE COURSE OF THE NEXT couple weeks. Antidiarrhea medicine as needed. Follow-up if progressive troubles. Warning signs were discussed in detail. I do not find any evidence of diverticulitis I do not recommend further scans or antibiotics currently. Patient is to follow-up if not getting better.

## 2015-12-30 ENCOUNTER — Ambulatory Visit: Payer: Commercial Managed Care - HMO | Admitting: Family Medicine

## 2015-12-30 LAB — URINE CULTURE

## 2015-12-31 ENCOUNTER — Telehealth: Payer: Self-pay | Admitting: Family Medicine

## 2015-12-31 DIAGNOSIS — R1030 Lower abdominal pain, unspecified: Secondary | ICD-10-CM

## 2015-12-31 NOTE — Telephone Encounter (Signed)
Patient states she is still having significant lower abdominal pain. Patient states she had ct in [place] and they told her she had a cyst on her kidney and she wants to know how large this was and if it could be causing her issues. patient states she is still having frequent bowel movements but they are more formed now. Patient states she is still having considerable pain in lower abdomen.

## 2015-12-31 NOTE — Telephone Encounter (Signed)
Discussed with patient. Patient advised that Dr Nicki Reaper reviewed over her CAT scan. There is no kidney cyst. Dr Nicki Reaper looked at the images and did not see any kidney cysts. There is a small stone but he does not feel that that is a source of her pain. She also has some cyst in the liver but these are stable. With the ongoing lower abdominal pain and discomfort Dr Nicki Reaper would recommend that we get her in with gastroenterology. Please see if one of the nurse practitioners or PAs at [facility] gastroenterology would be willing to see this patient as a work in this week. She has seen them before for a colonoscopy. The patient has had a hysterectomy but please inquire with the patient if she had removal of her ovaries?. Patent verbalized understanding and stated her ovaries have been removed. Urgent referral to [facility] placed in EPIC.

## 2015-12-31 NOTE — Telephone Encounter (Signed)
Pt is wanting to speak with a nurse regarding the cyst on her kidney. Pt states that she is still having lower abd pain.

## 2015-12-31 NOTE — Telephone Encounter (Signed)
I reviewed over her CAT scan. There is no kidney cyst. I looked at the images I do not see any kidney cysts. There is a small stone but I do not feel that that is a source of her pain. She also has some cyst in the liver but these are stable. With the ongoing lower abdominal pain and discomfort I would recommend that we get her in with gastroenterology. Please see if one of the nurse practitioners or PAs at [facility] gastroenterology would be willing to see this patient as a work in this week. She has seen them before for a colonoscopy. The patient has had a hysterectomy but please inquire with the patient if she had removal of her ovaries?

## 2016-01-01 ENCOUNTER — Telehealth: Payer: Self-pay | Admitting: Family Medicine

## 2016-01-01 NOTE — Telephone Encounter (Signed)
Called & spoke with pt, explained that referral was sent to Dr. Oneida Alar office and that someone from her office would contact pt to schedule appointment, pt verbalized understanding

## 2016-01-02 ENCOUNTER — Ambulatory Visit (INDEPENDENT_AMBULATORY_CARE_PROVIDER_SITE_OTHER): Payer: Commercial Managed Care - HMO | Admitting: Gastroenterology

## 2016-01-02 ENCOUNTER — Encounter: Payer: Self-pay | Admitting: Gastroenterology

## 2016-01-02 VITALS — BP 156/69 | HR 46 | Temp 97.6°F | Ht 65.0 in | Wt 249.8 lb

## 2016-01-02 DIAGNOSIS — R1013 Epigastric pain: Secondary | ICD-10-CM | POA: Diagnosis not present

## 2016-01-02 MED ORDER — PANTOPRAZOLE SODIUM 40 MG PO TBEC: 40.0000 mg | DELAYED_RELEASE_TABLET | Freq: Every day | ORAL | Status: DC

## 2016-01-02 MED ORDER — SUCRALFATE 1 GM/10ML PO SUSP: 1.0000 g | Freq: Three times a day (TID) | ORAL | Status: DC

## 2016-01-02 NOTE — Patient Instructions (Signed)
I want you to stop Prilosec (omeprazole). Start Protonix once each morning, 30 minutes before breakfast. I have also sent in a liquid called Carafate that you can use 4 times a day to help soothe your stomach/esophagus.  I would like you to stop Mobic this weekend if you can. See if this helps anything.   Continue dicyclomine as needed. If you develop diarrhea again, let us know so we can do stool studies.   Call us if no improvement, and we will see you in 2 -4 weeks.

## 2016-01-02 NOTE — Progress Notes (Signed)
Referring Provider: [name], MD Primary Care Physician:  [name], MD  Primary GI: Dr. Oneida Alar    Chief Complaint  Patient presents with  . Abdominal Pain    HPI:   [name] is a 70 y.o. female presenting today with a history of constipation, last seen in Dec 2016. Linzess 145 mcg daily. CT December 28, 2015 with stable hepatic cysts, non-obstructive left renal stone, sigmoid and descending diverticulosis, no other abnormality.   Abdominal pain in upper abdomen, radiating down bilateral sides. Constant right now. Hasn't had anything like this before. Has some nausea, some vomiting. Gallbladder absent. If she eats anything or drinks anything, in a few minutes, she is in the bathroom. States she has a normal BM but just going a lot. No prior EGD. No rectal bleeding. Stool is not hard. Soft. Starts off with little pieces, then a lot. Dicyclomine not helping. Lomotil not helping. Feels acid coming up. On Omeprazole once daily, chronically. Some chills, no fever. Had diarrhea on Saturday. Stool feels productive.   Past Medical History  Diagnosis Date  . Hypertension   . Diverticulitis   . Cyst on liver  . Blood clot of artery under arm ([place])   . Diabetes mellitus     controlled by diet  . Menopause   . Fibrocystic breast disease   . Impaired glucose tolerance   . Hepatic cyst   . IBS (irritable bowel syndrome)   . DVT of axillary vein, acute right 11/24/2012    Dx on Jan 28, 2012.    [name] Depression     Past Surgical History  Procedure Laterality Date  . Back surgery    . Cholecystectomy    . Knee arthrocentesis  left  . Breast surgery  right    cyst removed  . Abdominal hysterectomy    . Tubal ligation    . Cast application  [id number]    Procedure: CAST APPLICATION;  Surgeon: Carole Civil, MD;  Location: [facility];  Service: Orthopedics;  Laterality: Right;  procedure room   . Colonoscopy  12/04/2004    NUR:Few small diverticula at sigmoid  colon/Small cecal polyp ablated   . Colonoscopy N/A 09/24/2014    SLF: small internal hemorrhoids    Current Outpatient Prescriptions  Medication Sig Dispense Refill  . acetaminophen (TYLENOL) 500 MG tablet Take 500 mg by mouth every 6 (six) hours as needed.    [name] aspirin 81 MG tablet Take 81 mg by mouth daily.    . Biotin w/ Vitamins C & E (HAIR SKIN & NAILS GUMMIES PO) Take 1 each by mouth 2 (two) times daily.    . Cholecalciferol (VITAMIN D3 PO) Take 5,000 Units by mouth daily.     [name] dicyclomine (BENTYL) 20 MG tablet Take 1 tablet (20 mg total) by mouth 3 (three) times daily as needed for spasms. 30 tablet 1  . diphenoxylate-atropine (LOMOTIL) 2.5-0.025 MG tablet Take 1 tablet by mouth 4 (four) times daily as needed for diarrhea or loose stools. 30 tablet 0  . doxycycline (VIBRAMYCIN) 100 MG capsule Take 100 mg by mouth 2 (two) times daily.    [name] glipiZIDE (GLUCOTROL) 5 MG tablet Take 1 tab po q am and 1/2 tab po suppertime 135 tablet 3  . hydrochlorothiazide (HYDRODIURIL) 12.5 MG tablet Take 1 tablet (12.5 mg total) by mouth daily. 90 tablet 3  . losartan (COZAAR) 50 MG tablet Take 1 tablet (50 mg total) by mouth daily. 30 tablet  0  . meloxicam (MOBIC) 7.5 MG tablet Take 7.5 mg by mouth daily.    . Multiple Vitamins-Minerals (MULTIVITAMIN WITH MINERALS) tablet Take 1 tablet by mouth daily.    [name] omeprazole (PRILOSEC) 20 MG capsule Take 1 capsule (20 mg total) by mouth daily as needed. (Patient taking differently: Take 20 mg by mouth daily. ) 90 capsule 1  . ondansetron (ZOFRAN ODT) 8 MG disintegrating tablet Take 1 tablet (8 mg total) by mouth every 8 (eight) hours as needed for nausea or vomiting. 20 tablet 0  . pravastatin (PRAVACHOL) 20 MG tablet TAKE 1 TABLET EVERY DAY 90 tablet 1  . traZODone (DESYREL) 50 MG tablet Take one half to one tablet qhs (Patient taking differently: Take 25-50 mg by mouth at bedtime as needed for sleep. Take one half to one tablet qhs) 90 tablet 0  . Linaclotide  (LINZESS) 145 MCG CAPS capsule Take 1 capsule (145 mcg total) by mouth daily. (Patient not taking: Reported on 01/02/2016) 30 capsule 5  . metroNIDAZOLE (FLAGYL) 500 MG tablet Take 1 tablet (500 mg total) by mouth 2 (two) times daily. (Patient not taking: Reported on 01/02/2016) 20 tablet 0   No current facility-administered medications for this visit.    Allergies as of 01/02/2016 - Review Complete 01/02/2016  Allergen Reaction Noted  . Norvasc [amlodipine besylate]  11/21/2012  . Penicillins Hives   . Advair diskus [fluticasone-salmeterol] Palpitations 11/21/2012  . Metformin and related  09/28/2013    Family History  Problem Relation Age of Onset  . Stroke Mother   . Heart attack Mother   . Cancer Sister   . Diabetes Sister   . Seizures Brother   . Diabetes Brother   . Heart disease Brother   . Colon cancer Neg Hx     Social History   Social History  . Marital Status: Married    Spouse Name: N/A  . Number of Children: N/A  . Years of Education: N/A   Occupational History  . Retired    Social History Main Topics  . Smoking status: Never Smoker   . Smokeless tobacco: Never Used     Comment: Never smoked  . Alcohol Use: No  . Drug Use: No  . Sexual Activity: Yes   Other Topics Concern  . None   Social History Narrative    Review of Systems: Negative unless mentioned in HPI   Physical Exam: BP 156/69 mmHg  Pulse 46  Temp(Src) 97.6 F (36.4 C) (Oral)  Ht 5\' 5"  (1.651 m)  Wt 249 lb 12.8 oz (113.309 kg)  BMI 41.57 kg/m2 General:   Alert and oriented. No distress noted. Pleasant and cooperative.  Head:  Normocephalic and atraumatic. Eyes:  Conjuctiva clear without scleral icterus. Heart:  S1, S2 present without murmurs, rubs, or gallops. Regular rate and rhythm. Abdomen:  +BS, soft, TTP more upper abdomen than lower abdomen, with mild to moderate TTP LLQ/RLQ and non-distended. No rebound or guarding. No HSM or masses noted. Msk:  Symmetrical without gross  deformities. Normal posture. Extremities:  Without edema. Neurologic:  Alert and  oriented x4;  grossly normal neurologically. Psych:  Alert and cooperative. Normal mood and affect.  Lab Results  Component Value Date   WBC 7.8 12/28/2015   HGB 13.8 12/28/2015   HCT 42.5 12/28/2015   MCV 76.9* 12/28/2015   PLT 304 12/28/2015   Lab Results  Component Value Date   ALT 24 12/28/2015   AST 25 12/28/2015   ALKPHOS 84  12/28/2015   BILITOT 0.5 12/28/2015   Lab Results  Component Value Date   CREATININE 0.79 12/28/2015   BUN 17 12/28/2015   NA 139 12/28/2015   K 3.6 12/28/2015   CL 102 12/28/2015   CO2 29 12/28/2015

## 2016-01-02 NOTE — Assessment & Plan Note (Signed)
Acute on chronic abdominal pain, located in epigastric region and radiating down. CT benign. Labs unrevealing. Appears she may have had a type of diarrheal illness over the past week, and she denies diarrhea currently. Stool seems soft, but more frequent. No rectal bleeding. Appears to be quite anxious, and she seemed to be less anxious when I told her the CT findings. Unclear etiology but query post-infectious IBS, possibly gastritis, if loose stool recurs will check stool studies. Will do the following: hold Mobic for now. Stop Prilosec. Start Protonix. Add Carafate. Continue dicyclomine. Resume Linzess if constipation recurs. Return in 2-4 weeks. If continues to have more dyspepsia-like symptoms, will pursue EGD.

## 2016-01-05 NOTE — Progress Notes (Signed)
CC'D TO PCP °

## 2016-01-06 ENCOUNTER — Other Ambulatory Visit: Payer: Self-pay | Admitting: Licensed Clinical Social Worker

## 2016-01-06 NOTE — Patient Outreach (Signed)
Assessment:  CSW spoke via phone with client on 01/06/16. CSW verified client identity. CSW received verbal permission from client on 01/06/16 for CSW to talk with client about current client needs. CSW and client spoke of client needs.  Client reported that she had her prescribed medications and is taking medications as prescribed. She is eating well and sleeping well. She drives herself to many of her medical appointments. Also, her spouse drives her to appointments as needed. She and CSW spoke of client care plan. CSW encouraged client to attend all scheduled client medical appointments in next 30 days.  Client reported that she had gone recently to the emergency room. Client said she is feeling better after visit to emergency room recently. She said she is taking her medications as prescribed.  She said she knows to call 911 as needed for emergency medical needs of client. CSW reminded client of [facility] support services with nursing, social work and pharmacy. CSW encouraged client to call CSW at [phone] as needed to discuss social work needs of client.   Plan:  Client to attend all scheduled client medical appointments in next 30 days. CSW to call client in 4 weeks to assess client needs at that time.   Norva Riffle.Estefanie Cornforth MSW, LCSW Licensed Clinical Social Worker [facility] Care Management [phone]

## 2016-01-16 ENCOUNTER — Encounter: Payer: Self-pay | Admitting: Family Medicine

## 2016-01-16 ENCOUNTER — Ambulatory Visit (INDEPENDENT_AMBULATORY_CARE_PROVIDER_SITE_OTHER): Payer: Commercial Managed Care - HMO | Admitting: Family Medicine

## 2016-01-16 VITALS — BP 128/80 | Ht 65.0 in | Wt 255.0 lb

## 2016-01-16 DIAGNOSIS — E119 Type 2 diabetes mellitus without complications: Secondary | ICD-10-CM

## 2016-01-16 DIAGNOSIS — I10 Essential (primary) hypertension: Secondary | ICD-10-CM | POA: Diagnosis not present

## 2016-01-16 DIAGNOSIS — E785 Hyperlipidemia, unspecified: Secondary | ICD-10-CM

## 2016-01-16 DIAGNOSIS — R109 Unspecified abdominal pain: Secondary | ICD-10-CM | POA: Diagnosis not present

## 2016-01-16 LAB — POCT GLYCOSYLATED HEMOGLOBIN (HGB A1C): HEMOGLOBIN A1C: 5.8

## 2016-01-16 NOTE — Progress Notes (Signed)
Subjective:    Patient ID: [name], female    DOB: 1946/01/15, 70 y.o.   MRN: [id number] Extremely nice yet complex patient she comes in for multiple different issues Diabetes She presents for her follow-up diabetic visit. She has type 2 diabetes mellitus. There are no hypoglycemic associated symptoms. Pertinent negatives for hypoglycemia include no confusion. There are no diabetic associated symptoms. Pertinent negatives for diabetes include no chest pain, no fatigue, no polydipsia, no polyphagia and no weakness. There are no hypoglycemic complications. There are no diabetic complications. There are no known risk factors for coronary artery disease. Current diabetic treatment includes oral agent (monotherapy). She is compliant with treatment all of the time.   Patient states that she is still seeing the gi doctor for her stomach issues. She is concerned that she has ongoing epigastric and middle abdominal pain she states at times she gets constipated she states that the specialist recommended medicine that was difficult for her to afford. She is frustrated that she is not getting better. She states it makes it difficult for her to get out.  She is under a moderate amount of stress with her son requiring extra care health wise. Patient denies being depressed.   Patient states that her ankles swell and her right leg jumps a lot. She relates intermittently in the muscles tendons John doesn't seem to be severe. Ankles swell intermittently.  Patient does relate occasionally having low sugar spells. This is typical when she doesn't eat especially when she is helping transport her son to [place]   Results for orders placed or performed in visit on 01/16/16  POCT glycosylated hemoglobin (Hb A1C)  Result Value Ref Range   Hemoglobin A1C 5.8      Review of Systems  Constitutional: Negative for activity change, appetite change and fatigue.  HENT: Negative for congestion.     Respiratory: Negative for cough.   Cardiovascular: Negative for chest pain.  Gastrointestinal: Negative for abdominal pain.  Endocrine: Negative for polydipsia and polyphagia.  Neurological: Negative for weakness.  Psychiatric/Behavioral: Negative for confusion.       Objective:   Physical Exam  Constitutional: She appears well-nourished. No distress.  Cardiovascular: Normal rate, regular rhythm and normal heart sounds.   No murmur heard. Pulmonary/Chest: Effort normal and breath sounds normal. No respiratory distress.  Musculoskeletal: She exhibits no edema.  Lymphadenopathy:    She has no cervical adenopathy.  Neurological: She is alert. She exhibits normal muscle tone.  Psychiatric: Her behavior is normal.  Vitals reviewed.         Assessment & Plan:  1. Type 2 diabetes mellitus without complication, without long-term current use of insulin (HCC) Significant diabetes but under great control I told the patient though that on the days she is helping her son she needs to reduce the glipizide to a half a tablet in the morning half at supper time also advised patient if she is having low sugar spells on a regular basis she also needs a reduce the dose as well. Healthy eating regular physical activity recommended patient was encouraged to get at least 150 minutes of physical activity per week - POCT glycosylated hemoglobin (Hb A1C) - Lipid panel - Microalbumin / creatinine urine ratio - Lipase - Amylase - Tissue Transglutaminase, IGA  2. Essential hypertension Blood pressure under good control currently. Continue current medication. Watch diet. - Lipid panel - Microalbumin / creatinine urine ratio - Lipase - Amylase - Tissue Transglutaminase, IGA  3. Hyperlipidemia  Cholesterol profile recommended. Continue medication. - Lipid panel - Microalbumin / creatinine urine ratio - Lipase - Amylase - Tissue Transglutaminase, IGA  4. Abdominal pain, unspecified abdominal  location Severe abdominal pain recent CAT scan looked good except for a small hepatic cyst also patient does have appointment with specialist coming up in a few days we will check pancreatic functions and also look for celiac disease. Await the results of these tests. - Lipid panel - Microalbumin / creatinine urine ratio - Lipase - Amylase - Tissue Transglutaminase, IGA  Follow-up in the fall time sooner if any problems 25 minutes was spent with the patient. Greater than half the time was spent in discussion and answering questions and counseling regarding the issues that the patient came in for today.

## 2016-01-18 LAB — MICROALBUMIN / CREATININE URINE RATIO
CREATININE, UR: 135.7 mg/dL
MICROALB/CREAT RATIO: 3.6 mg/g creat (ref 0.0–30.0)
Microalbumin, Urine: 4.9 ug/mL

## 2016-01-18 LAB — AMYLASE: AMYLASE: 46 U/L (ref 31–124)

## 2016-01-18 LAB — LIPASE: Lipase: 28 U/L (ref 0–59)

## 2016-01-18 LAB — TISSUE TRANSGLUTAMINASE, IGA: Transglutaminase IgA: <2 U/mL (ref 0–3)

## 2016-01-18 LAB — LIPID PANEL
Chol/HDL Ratio: 3.2 ratio units (ref 0.0–4.4)
Cholesterol, Total: 164 mg/dL (ref 100–199)
HDL: 52 mg/dL (ref >39)
LDL CALC: 93 mg/dL (ref 0–99)
Triglycerides: 93 mg/dL (ref 0–149)
VLDL CHOLESTEROL CAL: 19 mg/dL (ref 5–40)

## 2016-01-22 ENCOUNTER — Encounter: Payer: Self-pay | Admitting: Gastroenterology

## 2016-01-22 ENCOUNTER — Other Ambulatory Visit: Payer: Self-pay

## 2016-01-22 ENCOUNTER — Ambulatory Visit: Payer: Commercial Managed Care - HMO | Admitting: Gastroenterology

## 2016-01-22 ENCOUNTER — Ambulatory Visit (INDEPENDENT_AMBULATORY_CARE_PROVIDER_SITE_OTHER): Payer: Commercial Managed Care - HMO | Admitting: Gastroenterology

## 2016-01-22 VITALS — BP 159/70 | HR 61 | Temp 98.1°F | Ht 65.0 in | Wt 249.4 lb

## 2016-01-22 DIAGNOSIS — R1013 Epigastric pain: Secondary | ICD-10-CM

## 2016-01-22 DIAGNOSIS — E119 Type 2 diabetes mellitus without complications: Secondary | ICD-10-CM

## 2016-01-22 MED ORDER — LINACLOTIDE 145 MCG PO CAPS: ORAL_CAPSULE | ORAL | Status: DC

## 2016-01-22 NOTE — Assessment & Plan Note (Addendum)
EMR REVIEWED FROM 2006 TO PRESENT. NEW ONSET DYSPEPSIA--DIFFERENTIAL DIAGNOSIS INCLUDES: H PYLORI GASTRITIS, UNCONTROLLED GERD, IBS-C EXACERBATED BY NARCOTIC USE, LESS LIKELY GE JUNCTION TUMOR, GASTRIC OR PANCREATIC CA OR CHRONIC MESENTERIC ISCHEMIA  CONTINUE YOUR WEIGHT LOSS AND EXERCISE EFFORTS.  DRINK WATER TO KEEP YOUR URINE LIGHT YELLOW. FOLLOW A HIGH FIBER/DIABETIC DIET.  HANDOUT GIVEN. SEE NUTRITION FOR EDUCATION FOR WEIGHT LOSS/CALORIE COUNT/PORTION SIZE AND ON DIABETIC/LOW FAT/HIGH FIBER DIET. CUT YOUR ICE CREAM AND CHEESE CONSUMPTION IN HALF. TAKE 3 LACTASE PILLS WITH MEALS UP TO THREE TIMES A DAY WHEN CONSUMING DAIRY. CONTINUE LINZESS. TAKE BENTYL ONLY ONE TIME A DAY. CONTINUE PROTONIX. TAKE 30 MINUTES PRIOR TO BREAKFAST. UPPER ENDOSCOPY IN 3-4 WEEKS TO BIOPSY YOUR STOMACH AND SMALL BOWEL.  STOP CARAFATE. CONSIDER VISCOUS LIDOCAINE. FOLLOW UP IN 4 MOS.

## 2016-01-22 NOTE — Progress Notes (Signed)
Subjective:    Patient ID: [name], female    DOB: 1945/10/30, 70 y.o.   MRN: [id number]  Kathleen Lange, MD   HPI BOWELS ARE MOVING GOOD WITH [name]. STILL HAVNG TROUBLE WITH UPPER ABDOMINAL PAIN THAT MOVES DOWN THE SIDES AND TO THE BOTTOM AFTER SHE EATS. FEELS PAIN AS WELL IF NOT EATING. IF EATS GOING TO BATHROOM. TAKING DICYCLOMINE. THINKS SHE'S GOING TO BATHROOM TOO MUCH. BMs: #4 3 A DAY. USES BENTYL ONCE A DAY IN THE MORNING. NO GOODYS OR BCs. TAKES 81 MG ASA IN THE ASA AND TAKES TYLENOL. TOOK ALEVE BUT NONE IN PAST 6-7 YRS. NO IBUPROFEN OR ETOH. FEELS EPIGASTRIC PAIN IS WORSE: SHARP. ASSOCIATED WITH NAUSEA AND NO VOMITING. FEELS BLOATED. MILK: ONCE A WEEK. ICE CREAM: A LOT CHEESE: YES. STAYS HOT. HAD DIARRHEA BACK IN April: WATERY, BUT THAT'S GONE. HAS TIGHTNESS IN CHEST: FOR 2-3 MOS, COMES AND GOES, TRIGGERS: IF EATS FRIED FOODS. MAY FEEL SOB WHEN SHE'S EXERCISED. NAUSEA: PRIMARILY IN THE AM. EATING MAY MAKE IT WORSE. NAUSEA LASTS: ~1 HR. WEIGHT STABLE SINCE JAN 2016. APPETITE: GOOD AT TIMES. MISSES MEALS: HUSBAND THINKS APPETITE GONE BECAUSE SHE EATS JUNK.   PT DENIES FEVER, CHILLS, HEMATOCHEZIA, vomiting, melena, SHORTNESS OF BREATH, CHANGE IN BOWEL IN HABITS, problems swallowing, OR problems with sedation.   Past Medical History  Diagnosis Date  . Hypertension   . Diverticulitis   . Cyst on liver  . Blood clot of artery under arm ([place])   . Diabetes mellitus     controlled by diet  . Menopause   . Fibrocystic breast disease   . Impaired glucose tolerance   . Hepatic cyst   . IBS (irritable bowel syndrome)   . DVT of axillary vein, acute right 11/24/2012    Dx on Jan 28, 2012.    [name] Depression     Past Surgical History  Procedure Laterality Date  . Back surgery    . Cholecystectomy    . Knee arthrocentesis  left  . Breast surgery  right    cyst removed  . Abdominal hysterectomy    . Tubal ligation    . Cast application  [id number]    Procedure: CAST  APPLICATION;  Surgeon: Kathleen Civil, MD;  Location: [facility];  Service: Orthopedics;  Laterality: Right;  procedure room   . Colonoscopy  12/04/2004    NUR:Few small diverticula at sigmoid colon/Small cecal polyp ablated   . Colonoscopy N/A 09/24/2014    SLF: small internal hemorrhoids    Allergies  Allergen Reactions  . Norvasc [Amlodipine Besylate]     Fatigue  . Penicillins Hives  . Advair Diskus [Fluticasone-Salmeterol] Palpitations  . Metformin And Related     Causes stomach discomfort   Current Outpatient Prescriptions  Medication Sig Dispense Refill  . TYLENOL 500 MG tablet Take 500 mg by mouth every 6 (six) hours as needed.    [name] aspirin 81 MG tablet Take 81 mg by mouth daily.    . Biotin w/ Vitamins C & E Take 1 each by mouth 2 (two) times daily.    . Cholecalciferol (VITAMIN D3 PO) Take 5,000 Units by mouth daily.     . BENTYL 20 MG tablet Take 1 tablet (20 mg total) PO TID as needed for spasms. QAM   .      .      . GLIPIZIDE  Take 1 tab po q am and 1/2 tab po suppertime    . HYDRODIURIL  Take 1 tablet (12.5 mg total) by mouth daily.    [name] losartan (COZAAR) 50 MG tablet Take 1 tablet (50 mg total) by mouth daily.    . MULTIVITAMIN WITH MINERALS tablet Take 1 tablet by mouth daily.    [name] ZOFRAN ODT 8 MG disintegrating  Take 1 tablet PO Q8H PRN as needed for nausea or vomiting. RARE   . PROTONIX 40 MG tablet Take 1 tablet (40 mg total) by mouth daily.    [name] PRAVACHOL 20 MG tablet TAKE 1 TABLET EVERY DAY    . DESYREL 50 MG tablet Take one half to one tablet qhs    . LAST DOSE APR 2017   .      [name]       Review of Systems PER HPI OTHERWISE ALL SYSTEMS ARE NEGATIVE.    Objective:   Physical Exam  Constitutional: She is oriented to person, place, and time. She appears well-developed and well-nourished. No distress.  HENT:  Head: Normocephalic and atraumatic.  Mouth/Throat: Oropharynx is clear and moist. No oropharyngeal exudate.  Eyes: Pupils are equal, round, and  reactive to light. No scleral icterus.  Neck: Normal range of motion. Neck supple.  Cardiovascular: Normal rate, regular rhythm and normal heart sounds.   Pulmonary/Chest: Effort normal and breath sounds normal. No respiratory distress.  Abdominal: Soft. Bowel sounds are normal. She exhibits no distension. There is tenderness. There is no rebound and no guarding.  MOD TTP IN LUQ &  IN THE EPIGASTRIUM, MILD TTP IN R/LLQ  Musculoskeletal: She exhibits no edema.  Lymphadenopathy:    She has no cervical adenopathy.  Neurological: She is alert and oriented to person, place, and time.  NO  NEW FOCAL DEFICITS  Psychiatric:  FLAT AFFECT, SLIGHTLY ANXIOUS MOOD  Vitals reviewed.     Assessment & Plan:

## 2016-01-22 NOTE — Progress Notes (Signed)
cc'ed to pcp °

## 2016-01-22 NOTE — Progress Notes (Signed)
ON RECALL  °

## 2016-01-22 NOTE — Patient Instructions (Addendum)
CONTINUE YOUR WEIGHT LOSS AND EXERCISE EFFORTS.  DRINK WATER TO KEEP YOUR URINE LIGHT YELLOW.  FOLLOW A HIGH FIBER/DIABETIC DIET. SEE INFO BELOW ON HIGH FIBER DIET.  SEE NUTRITION FOR EDUCATION FOR WEIGHT LOSS/CALORIE COUNT/PORTION SIZE AND ON DIABETIC/LOW FAT/HIGH FIBER DIET.  CUT YOUR ICE CREAM AND CHEESE CONSUMPTION IN HALF. TAKE 3 LACTASE PILLS WITH MEALS UP TO THREE TIMES A DAY WHEN CONSUMING DAIRY.  CONTINUE LINZESS. TAKE BENTYL ONLY ONE TIME A DAY.  CONTINUE PROTONIX. TAKE 30 MINUTES PRIOR TO BREAKFAST.  UPPER ENDOSCOPY IN 3-4 WEEKS TO BIOPSY YOUR STOMACH AND SMALL BOWEL.   STOP CARAFATE.   FOLLOW UP IN 4 MOS.    High-Fiber Diet A high-fiber diet changes your normal diet to include more whole grains, legumes, fruits, and vegetables. Changes in the diet involve replacing refined carbohydrates with unrefined foods. The calorie level of the diet is essentially unchanged. The Dietary Reference Intake (recommended amount) for adult males is 38 grams per day. For adult females, it is 25 grams per day. Pregnant and lactating women should consume 28 grams of fiber per day. Fiber is the intact part of a plant that is not broken down during digestion. Functional fiber is fiber that has been isolated from the plant to provide a beneficial effect in the body. PURPOSE  Increase stool bulk.   Ease and regulate bowel movements.   Lower cholesterol.  INDICATIONS THAT YOU NEED MORE FIBER  Constipation and hemorrhoids.   Uncomplicated diverticulosis (intestine condition) and irritable bowel syndrome.   Weight management.   As a protective measure against hardening of the arteries (atherosclerosis), diabetes, and cancer.   GUIDELINES FOR INCREASING FIBER IN THE DIET  Start adding fiber to the diet slowly. A gradual increase of about 5 more grams (2 slices of whole-wheat bread, 2 servings of most fruits or vegetables, or 1 bowl of high-fiber cereal) per day is best. Too rapid an  increase in fiber may result in constipation, flatulence, and bloating.   Drink enough water and fluids to keep your urine clear or pale yellow. Water, juice, or caffeine-free drinks are recommended. Not drinking enough fluid may cause constipation.   Eat a variety of high-fiber foods rather than one type of fiber.   Try to increase your intake of fiber through using high-fiber foods rather than fiber pills or supplements that contain small amounts of fiber.   The goal is to change the types of food eaten. Do not supplement your present diet with high-fiber foods, but replace foods in your present diet.  INCLUDE A VARIETY OF FIBER SOURCES  Replace refined and processed grains with whole grains, canned fruits with fresh fruits, and incorporate other fiber sources. White rice, white breads, and most bakery goods contain little or no fiber.   Brown whole-grain rice, buckwheat oats, and many fruits and vegetables are all good sources of fiber. These include: broccoli, Brussels sprouts, cabbage, cauliflower, beets, sweet potatoes, white potatoes (skin on), carrots, tomatoes, eggplant, squash, berries, fresh fruits, and dried fruits.   Cereals appear to be the richest source of fiber. Cereal fiber is found in whole grains and bran. Bran is the fiber-rich outer coat of cereal grain, which is largely removed in refining. In whole-grain cereals, the bran remains. In breakfast cereals, the largest amount of fiber is found in those with "bran" in their names. The fiber content is sometimes indicated on the label.   You may need to include additional fruits and vegetables each day.   In  baking, for 1 cup white flour, you may use the following substitutions:   1 cup whole-wheat flour minus 2 tablespoons.   1/2 cup white flour plus 1/2 cup whole-wheat flour.

## 2016-01-22 NOTE — Progress Notes (Signed)
REVIEWED-NO ADDITIONAL RECOMMENDATIONS. 

## 2016-01-29 LAB — HM DIABETES EYE EXAM

## 2016-02-02 ENCOUNTER — Encounter (HOSPITAL_COMMUNITY)
Admission: RE | Disposition: A | Discharge status: Home / Self Care | Payer: Self-pay | Source: Ambulatory Visit | Attending: Gastroenterology

## 2016-02-02 ENCOUNTER — Ambulatory Visit (HOSPITAL_COMMUNITY)
Admission: RE | Admit: 2016-02-02 | Discharge: 2016-02-02 | Disposition: A | Discharge status: Home / Self Care | Payer: Commercial Managed Care - HMO | Source: Ambulatory Visit | Attending: Gastroenterology | Admitting: Gastroenterology

## 2016-02-02 ENCOUNTER — Telehealth: Payer: Self-pay | Admitting: Gastroenterology

## 2016-02-02 ENCOUNTER — Encounter (HOSPITAL_COMMUNITY): Payer: Self-pay | Admitting: *Deleted

## 2016-02-02 DIAGNOSIS — I10 Essential (primary) hypertension: Secondary | ICD-10-CM | POA: Insufficient documentation

## 2016-02-02 DIAGNOSIS — K2960 Other gastritis without bleeding: Secondary | ICD-10-CM | POA: Diagnosis not present

## 2016-02-02 DIAGNOSIS — R1011 Right upper quadrant pain: Secondary | ICD-10-CM | POA: Diagnosis not present

## 2016-02-02 DIAGNOSIS — N6019 Diffuse cystic mastopathy of unspecified breast: Secondary | ICD-10-CM | POA: Insufficient documentation

## 2016-02-02 DIAGNOSIS — K2970 Gastritis, unspecified, without bleeding: Secondary | ICD-10-CM

## 2016-02-02 DIAGNOSIS — Z7982 Long term (current) use of aspirin: Secondary | ICD-10-CM | POA: Diagnosis not present

## 2016-02-02 DIAGNOSIS — F329 Major depressive disorder, single episode, unspecified: Secondary | ICD-10-CM | POA: Insufficient documentation

## 2016-02-02 DIAGNOSIS — E119 Type 2 diabetes mellitus without complications: Secondary | ICD-10-CM | POA: Insufficient documentation

## 2016-02-02 DIAGNOSIS — K317 Polyp of stomach and duodenum: Secondary | ICD-10-CM | POA: Insufficient documentation

## 2016-02-02 DIAGNOSIS — Z79899 Other long term (current) drug therapy: Secondary | ICD-10-CM | POA: Insufficient documentation

## 2016-02-02 DIAGNOSIS — K3189 Other diseases of stomach and duodenum: Secondary | ICD-10-CM | POA: Insufficient documentation

## 2016-02-02 DIAGNOSIS — K589 Irritable bowel syndrome without diarrhea: Secondary | ICD-10-CM | POA: Insufficient documentation

## 2016-02-02 DIAGNOSIS — R1012 Left upper quadrant pain: Secondary | ICD-10-CM | POA: Diagnosis not present

## 2016-02-02 DIAGNOSIS — R1013 Epigastric pain: Secondary | ICD-10-CM | POA: Diagnosis not present

## 2016-02-02 DIAGNOSIS — Z8719 Personal history of other diseases of the digestive system: Secondary | ICD-10-CM | POA: Diagnosis not present

## 2016-02-02 DIAGNOSIS — Z86718 Personal history of other venous thrombosis and embolism: Secondary | ICD-10-CM | POA: Insufficient documentation

## 2016-02-02 HISTORY — PX: ESOPHAGOGASTRODUODENOSCOPY: SHX5428

## 2016-02-02 LAB — GLUCOSE, CAPILLARY: Glucose-Capillary: 94 mg/dL (ref 65–99)

## 2016-02-02 SURGERY — EGD (ESOPHAGOGASTRODUODENOSCOPY)
Anesthesia: Moderate Sedation

## 2016-02-02 MED ORDER — MIDAZOLAM HCL 5 MG/5ML IJ SOLN
INTRAMUSCULAR | Status: AC
  Filled 2016-02-02: qty 10

## 2016-02-02 MED ORDER — MEPERIDINE HCL 100 MG/ML IJ SOLN
INTRAMUSCULAR | Status: AC
  Filled 2016-02-02: qty 2

## 2016-02-02 MED ORDER — SODIUM CHLORIDE 0.9 % IV SOLN
INTRAVENOUS | Status: DC
  Administered 2016-02-02: 1000 mL via INTRAVENOUS

## 2016-02-02 MED ORDER — LIDOCAINE VISCOUS 2 % MT SOLN
OROMUCOSAL | Status: DC | PRN
  Administered 2016-02-02: 1 via OROMUCOSAL

## 2016-02-02 MED ORDER — MEPERIDINE HCL 100 MG/ML IJ SOLN
INTRAMUSCULAR | Status: DC | PRN
Start: 2016-02-02 — End: 2016-02-02
  Administered 2016-02-02: 50 mg via INTRAVENOUS
  Administered 2016-02-02: 25 mg via INTRAVENOUS

## 2016-02-02 MED ORDER — LIDOCAINE VISCOUS 2 % MT SOLN
OROMUCOSAL | Status: AC
  Filled 2016-02-02: qty 15

## 2016-02-02 MED ORDER — MIDAZOLAM HCL 5 MG/5ML IJ SOLN
INTRAMUSCULAR | Status: DC | PRN
  Administered 2016-02-02 (×2): 2 mg via INTRAVENOUS

## 2016-02-02 MED ORDER — STERILE WATER FOR IRRIGATION IR SOLN
Status: DC | PRN
  Administered 2016-02-02: 10:00:00

## 2016-02-02 NOTE — Discharge Instructions (Signed)
You have mild gastritis AND GASTRIC POLYPS. I biopsied your stomach AND SMALL BOWEL.   FOLLOW A LOW FAT DIET. MEATS SHOULD BE BAKED, BROILED, OR BOILED. AVOID FRIED FOODS. SEE INFO BELOW.  CONTINUE PROTONIX. TAKE 30 MINUTES PRIOR TO BREAKFAST.  AVOID TRIGGERS FOR GASTRITIS. SEE INFO BELOW.  FOLLOW UP IN 4 MOS.  UPPER ENDOSCOPY AFTER CARE Read the instructions outlined below and refer to this sheet in the next week. These discharge instructions provide you with general information on caring for yourself after you leave the hospital. While your treatment has been planned according to the most current medical practices available, unavoidable complications occasionally occur. If you have any problems or questions after discharge, call DR. Charnae Lill, [phone].  ACTIVITY  You may resume your regular activity, but move at a slower pace for the next 24 hours.   Take frequent rest periods for the next 24 hours.   Walking will help get rid of the air and reduce the bloated feeling in your belly (abdomen).   No driving for 24 hours (because of the medicine (anesthesia) used during the test).   You may shower.   Do not sign any important legal documents or operate any machinery for 24 hours (because of the anesthesia used during the test).    NUTRITION  Drink plenty of fluids.   You may resume your normal diet as instructed by your doctor.   Begin with a light meal and progress to your normal diet. Heavy or fried foods are harder to digest and may make you feel sick to your stomach (nauseated).   Avoid alcoholic beverages for 24 hours or as instructed.    MEDICATIONS  You may resume your normal medications.   WHAT YOU CAN EXPECT TODAY  Some feelings of bloating in the abdomen.   Passage of more gas than usual.    IF YOU HAD A BIOPSY TAKEN DURING THE UPPER ENDOSCOPY:  Eat a soft diet IF YOU HAVE NAUSEA, BLOATING, ABDOMINAL PAIN, OR VOMITING.    FINDING OUT THE RESULTS  OF YOUR TEST Not all test results are available during your visit. DR. Oneida Alar WILL CALL YOU WITHIN 14 DAYS OF YOUR PROCEDUE WITH YOUR RESULTS. Do not assume everything is normal if you have not heard from DR. Chancey Ringel, CALL HER OFFICE AT [phone].  SEEK IMMEDIATE MEDICAL ATTENTION AND CALL THE OFFICE: [phone] IF:  You have more than a spotting of blood in your stool.   Your belly is swollen (abdominal distention).   You are nauseated or vomiting.   You have a temperature over 101F.   You have abdominal pain or discomfort that is severe or gets worse throughout the day.   Gastritis  Gastritis is an inflammation (the body's way of reacting to injury and/or infection) of the stomach. It is often caused by viral or bacterial (germ) infections. It can also be caused BY ASPIRIN, BC/GOODY POWDER'S, (IBUPROFEN) MOTRIN, OR ALEVE (NAPROXEN), chemicals (including alcohol), SPICY FOODS, and medications. This illness may be associated with generalized malaise (feeling tired, not well), UPPER ABDOMINAL STOMACH cramps, and fever. One common bacterial cause of gastritis is an organism known as H. Pylori. This can be treated with antibiotics.    Low-Fat Diet  BREADS, CEREALS, PASTA, RICE, DRIED PEAS, AND BEANS These products are high in carbohydrates and most are low in fat. Therefore, they can be increased in the diet as substitutes for fatty foods. They too, however, contain calories and should not be eaten in excess. Cereals  can be eaten for snacks as well as for breakfast.  Include foods that contain fiber (fruits, vegetables, whole grains, and legumes). Research shows that fiber may lower blood cholesterol levels, especially the water-soluble fiber found in fruits, vegetables, oat products, and legumes.  FRUITS AND VEGETABLES It is good to eat fruits and vegetables. Besides being sources of fiber, both are rich in vitamins and some minerals. They help you get the daily allowances of these  nutrients. Fruits and vegetables can be used for snacks and desserts.  MEATS Limit lean meat, chicken, [place], and fish to no more than 6 ounces per day.  Beef, Pork, and Lamb Use lean cuts of beef, pork, and lamb. Lean cuts include:  Extra-lean ground beef.  Arm roast.  Sirloin tip.  Center-cut ham.  Round steak.  Loin chops.  Rump roast.  Tenderloin.  Trim all fat off the outside of meats before cooking. It is not necessary to severely decrease the intake of red meat, but lean choices should be made. Lean meat is rich in protein and contains a highly absorbable form of iron. Premenopausal women, in particular, should avoid reducing lean red meat because this could increase the risk for low red blood cells (iron-deficiency anemia).  Chicken and [place] These are good sources of protein. The fat of poultry can be reduced by removing the skin and underlying fat layers before cooking. Chicken and [place] can be substituted for lean red meat in the diet. Poultry should not be fried or covered with high-fat sauces.  Fish and Shellfish Fish is a good source of protein. Shellfish contain cholesterol, but they usually are low in saturated fatty acids. The preparation of fish is important. Like chicken and [place], they should not be fried or covered with high-fat sauces.  EGGS Egg whites contain no fat or cholesterol. They can be eaten often. Try 1 to 2 egg whites instead of whole eggs in recipes or use egg substitutes that do not contain yolk.  MILK AND DAIRY PRODUCTS Use skim or 1% milk instead of 2% or whole milk. Decrease whole milk, natural, and processed cheeses. Use nonfat or low-fat (2%) cottage cheese or low-fat cheeses made from vegetable oils. Choose nonfat or low-fat (1 to 2%) yogurt. Experiment with evaporated skim milk in recipes that call for heavy cream. Substitute low-fat yogurt or low-fat cottage cheese for sour cream in dips and salad dressings. Have at least 2 servings of low-fat  dairy products, such as 2 glasses of skim (or 1%) milk each day to help get your daily calcium intake.  FATS AND OILS Reduce the total intake of fats, especially saturated fat. Butterfat, lard, and beef fats are high in saturated fat and cholesterol. These should be avoided as much as possible. Vegetable fats do not contain cholesterol, but certain vegetable fats, such as coconut oil, palm oil, and palm kernel oil are very high in saturated fats. These should be limited. These fats are often used in bakery goods, processed foods, popcorn, oils, and nondairy creamers. Vegetable shortenings and some peanut butters contain hydrogenated oils, which are also saturated fats. Read the labels on these foods and check for saturated vegetable oils.  Unsaturated vegetable oils and fats do not raise blood cholesterol. However, they should be limited because they are fats and are high in calories. Total fat should still be limited to 30% of your daily caloric intake. Desirable liquid vegetable oils are corn oil, cottonseed oil, olive oil, canola oil, safflower oil, soybean oil, and  sunflower oil. Peanut oil is not as good, but small amounts are acceptable. Buy a heart-healthy tub margarine that has no partially hydrogenated oils in the ingredients. Mayonnaise and salad dressings often are made from unsaturated fats, but they should also be limited because of their high calorie and fat content. Seeds, nuts, peanut butter, olives, and avocados are high in fat, but the fat is mainly the unsaturated type. These foods should be limited mainly to avoid excess calories and fat.  OTHER EATING TIPS Snacks  Most sweets should be limited as snacks. They tend to be rich in calories and fats, and their caloric content outweighs their nutritional value. Some good choices in snacks are graham crackers, melba toast, soda crackers, bagels (no egg), English muffins, fruits, and vegetables. These snacks are preferable to snack crackers,  [place] fries, and chips. Popcorn should be air-popped or cooked in small amounts of liquid vegetable oil.  Desserts Eat fruit, low-fat yogurt, and fruit ices instead of pastries, cake, and cookies. Sherbet, angel food cake, gelatin dessert, frozen low-fat yogurt, or other frozen products that do not contain saturated fat (pure fruit juice bars, frozen ice pops) are also acceptable.   COOKING METHODS Choose those methods that use little or no fat. They include: Poaching.  Braising.  Steaming.  Grilling.  Baking.  Stir-frying.  Broiling.  Microwaving.  Foods can be cooked in a nonstick pan without added fat, or use a nonfat cooking spray in regular cookware. Limit fried foods and avoid frying in saturated fat. Add moisture to lean meats by using water, broth, cooking wines, and other nonfat or low-fat sauces along with the cooking methods mentioned above. Soups and stews should be chilled after cooking. The fat that forms on top after a few hours in the refrigerator should be skimmed off. When preparing meals, avoid using excess salt. Salt can contribute to raising blood pressure in some people.  EATING AWAY FROM HOME Order entres, potatoes, and vegetables without sauces or butter. When meat exceeds the size of a deck of cards (3 to 4 ounces), the rest can be taken home for another meal. Choose vegetable or fruit salads and ask for low-calorie salad dressings to be served on the side. Use dressings sparingly. Limit high-fat toppings, such as bacon, crumbled eggs, cheese, sunflower seeds, and olives. Ask for heart-healthy tub margarine instead of butter.

## 2016-02-02 NOTE — Interval H&P Note (Signed)
History and Physical Interval Note:  02/02/2016 9:27 AM  [name]  has presented today for surgery, with the diagnosis of dyspepsia  The various methods of treatment have been discussed with the patient and family. After consideration of risks, benefits and other options for treatment, the patient has consented to  Procedure(s) with comments: ESOPHAGOGASTRODUODENOSCOPY (EGD) (N/A) - 930  as a surgical intervention .  The patient's history has been reviewed, patient examined, no change in status, stable for surgery.  I have reviewed the patient's chart and labs.  Questions were answered to the patient's satisfaction.     [employer]

## 2016-02-02 NOTE — Op Note (Signed)
[facility] Patient Name: [name] Procedure Date: 02/02/2016 9:21 AM MRN: [id number] Date of Birth: [birth year] Attending MD: Barney Drain , MD CSN: [id number] Age: 70 Admit Type: Outpatient Procedure:                Upper GI endoscopy WITH COLD FORCEPS                            BIOPSY(GASTRIC/DUODENAL) Indications:              Epigastric abdominal pain, Abdominal pain in the                            right upper quadrant, Abdominal pain in the left                            upper quadrant, Dyspepsia Providers:                Barney Drain, MD, Rosina Lowenstein, RN, Zoila Shutter,                            Technologist Referring MD:             Elayne Snare. Luking Medicines:                Meperidine 75 mg IV, Midazolam 4 mg IV Complications:            No immediate complications. Estimated Blood Loss:     Estimated blood loss was minimal. Procedure:                Pre-Anesthesia Assessment:                           - Prior to the procedure, a History and Physical                            was performed, and patient medications and                            allergies were reviewed. The patient's tolerance of                            previous anesthesia was also reviewed. The risks                            and benefits of the procedure and the sedation                            options and risks were discussed with the patient.                            All questions were answered, and informed consent                            was obtained. Prior Anticoagulants: The patient has  taken aspirin, last dose was day of procedure. ASA                            Grade Assessment: II - A patient with mild systemic                            disease. After reviewing the risks and benefits,                            the patient was deemed in satisfactory condition to                            undergo the procedure.                           After  obtaining informed consent, the endoscope was                            passed under direct vision. Throughout the                            procedure, the patient's blood pressure, pulse, and                            oxygen saturations were monitored continuously. The                            [id number]) scope was introduced through the                            mouth, and advanced to the second part of duodenum.                            The upper GI endoscopy was accomplished with ease.                            The patient tolerated the procedure well. Scope In: 9:48:43 AM Scope Out: 10:00:41 AM Total Procedure Duration: 0 hours 11 minutes 58 seconds  Findings:      The examined esophagus was normal.      Segmental mild inflammation characterized by congestion (edema) and       erythema was found in the stomach. Biopsies were taken with a cold       forceps for Helicobacter pylori testing.      The examined duodenum was normal. Biopsies for histology were taken with       a cold forceps for evaluation of celiac disease. Impression:               - Normal esophagus.                           - Gastritis. Biopsied.                           - Normal examined duodenum. Biopsied. Moderate Sedation:  Moderate (conscious) sedation was administered by the endoscopy nurse       and supervised by the endoscopist. The following parameters were       monitored: oxygen saturation, heart rate, blood pressure, and response       to care. Total physician intraservice time was 21 minutes. Recommendation:           - Low fat diet.                           - Continue present medications.                           - Await pathology results.                           - Return to my office in 4 months.                           FOLLOW A LOW FAT DIET. MEATS SHOULD BE BAKED,                            BROILED, OR BOILED. AVOID FRIED FOODS. SEE INFO                             BELOW.                           CONTINUE PROTONIX. TAKE 30 MINUTES PRIOR TO                            BREAKFAST.                           AVOID TRIGGERS FOR GASTRITIS.                           - Patient has a contact number available for                            emergencies. The signs and symptoms of potential                            delayed complications were discussed with the                            patient. Return to normal activities tomorrow.                            Written discharge instructions were provided to the                            patient. Procedure Code(s):        --- Professional ---                           [phone], Esophagogastroduodenoscopy, flexible,  transoral; with biopsy, single or multiple                           G0500, Moderate sedation services provided by the                            same physician or other qualified health care                            professional performing a gastrointestinal                            endoscopic service that sedation supports,                            requiring the presence of an independent trained                            observer to assist in the monitoring of the                            patient's level of consciousness and physiological                            status; initial 15 minutes of intra-service time;                            patient age 74 years or older (additional time may                            be reported with [phone], as appropriate) Diagnosis Code(s):        --- Professional ---                           K29.70, Gastritis, unspecified, without bleeding                           R10.13, Epigastric pain                           R10.11, Right upper quadrant pain                           R10.12, Left upper quadrant pain CPT copyright 2016 American Medical Association. All rights reserved. The codes documented in this report are preliminary and  upon coder review may  be revised to meet current compliance requirements. Barney Drain, MD Barney Drain, MD 02/02/2016 4:41:32 PM This report has been signed electronically. Number of Addenda: 0

## 2016-02-02 NOTE — H&P (View-Only) (Signed)
Subjective:    Patient ID: [name], female    DOB: 1945/10/04, 70 y.o.   MRN: [id number]  [name], MD   HPI BOWELS ARE MOVING GOOD WITH Rolan Lipa. STILL HAVNG TROUBLE WITH UPPER ABDOMINAL PAIN THAT MOVES DOWN THE SIDES AND TO THE BOTTOM AFTER SHE EATS. FEELS PAIN AS WELL IF NOT EATING. IF EATS GOING TO BATHROOM. TAKING DICYCLOMINE. THINKS SHE'S GOING TO BATHROOM TOO MUCH. BMs: #4 3 A DAY. USES BENTYL ONCE A DAY IN THE MORNING. NO GOODYS OR BCs. TAKES 81 MG ASA IN THE ASA AND TAKES TYLENOL. TOOK ALEVE BUT NONE IN PAST 6-7 YRS. NO IBUPROFEN OR ETOH. FEELS EPIGASTRIC PAIN IS WORSE: SHARP. ASSOCIATED WITH NAUSEA AND NO VOMITING. FEELS BLOATED. MILK: ONCE A WEEK. ICE CREAM: A LOT CHEESE: YES. STAYS HOT. HAD DIARRHEA BACK IN April: WATERY, BUT THAT'S GONE. HAS TIGHTNESS IN CHEST: FOR 2-3 MOS, COMES AND GOES, TRIGGERS: IF EATS FRIED FOODS. MAY FEEL SOB WHEN SHE'S EXERCISED. NAUSEA: PRIMARILY IN THE AM. EATING MAY MAKE IT WORSE. NAUSEA LASTS: ~1 HR. WEIGHT STABLE SINCE JAN 2016. APPETITE: GOOD AT TIMES. MISSES MEALS: HUSBAND THINKS APPETITE GONE BECAUSE SHE EATS JUNK.   PT DENIES FEVER, CHILLS, HEMATOCHEZIA, vomiting, melena, SHORTNESS OF BREATH, CHANGE IN BOWEL IN HABITS, problems swallowing, OR problems with sedation.   Past Medical History  Diagnosis Date  . Hypertension   . Diverticulitis   . Cyst on liver  . Blood clot of artery under arm ([place])   . Diabetes mellitus     controlled by diet  . Menopause   . Fibrocystic breast disease   . Impaired glucose tolerance   . Hepatic cyst   . IBS (irritable bowel syndrome)   . DVT of axillary vein, acute right 11/24/2012    Dx on Jan 28, 2012.    [name] Depression     Past Surgical History  Procedure Laterality Date  . Back surgery    . Cholecystectomy    . Knee arthrocentesis  left  . Breast surgery  right    cyst removed  . Abdominal hysterectomy    . Tubal ligation    . Cast application  [id number]    Procedure: CAST  APPLICATION;  Surgeon: Carole Civil, MD;  Location: [facility];  Service: Orthopedics;  Laterality: Right;  procedure room   . Colonoscopy  12/04/2004    NUR:Few small diverticula at sigmoid colon/Small cecal polyp ablated   . Colonoscopy N/A 09/24/2014    SLF: small internal hemorrhoids    Allergies  Allergen Reactions  . Norvasc [Amlodipine Besylate]     Fatigue  . Penicillins Hives  . Advair Diskus [Fluticasone-Salmeterol] Palpitations  . Metformin And Related     Causes stomach discomfort   Current Outpatient Prescriptions  Medication Sig Dispense Refill  . TYLENOL 500 MG tablet Take 500 mg by mouth every 6 (six) hours as needed.    [name] aspirin 81 MG tablet Take 81 mg by mouth daily.    . Biotin w/ Vitamins C & E Take 1 each by mouth 2 (two) times daily.    . Cholecalciferol (VITAMIN D3 PO) Take 5,000 Units by mouth daily.     . BENTYL 20 MG tablet Take 1 tablet (20 mg total) PO TID as needed for spasms. QAM   .      .      . GLIPIZIDE  Take 1 tab po q am and 1/2 tab po suppertime    . HYDRODIURIL  Take 1 tablet (12.5 mg total) by mouth daily.    [name] losartan (COZAAR) 50 MG tablet Take 1 tablet (50 mg total) by mouth daily.    . MULTIVITAMIN WITH MINERALS tablet Take 1 tablet by mouth daily.    [name] ZOFRAN ODT 8 MG disintegrating  Take 1 tablet PO Q8H PRN as needed for nausea or vomiting. RARE   . PROTONIX 40 MG tablet Take 1 tablet (40 mg total) by mouth daily.    [name] PRAVACHOL 20 MG tablet TAKE 1 TABLET EVERY DAY    . DESYREL 50 MG tablet Take one half to one tablet qhs    . LAST DOSE APR 2017   .      [name]       Review of Systems PER HPI OTHERWISE ALL SYSTEMS ARE NEGATIVE.    Objective:   Physical Exam  Constitutional: She is oriented to person, place, and time. She appears well-developed and well-nourished. No distress.  HENT:  Head: Normocephalic and atraumatic.  Mouth/Throat: Oropharynx is clear and moist. No oropharyngeal exudate.  Eyes: Pupils are equal, round, and  reactive to light. No scleral icterus.  Neck: Normal range of motion. Neck supple.  Cardiovascular: Normal rate, regular rhythm and normal heart sounds.   Pulmonary/Chest: Effort normal and breath sounds normal. No respiratory distress.  Abdominal: Soft. Bowel sounds are normal. She exhibits no distension. There is tenderness. There is no rebound and no guarding.  MOD TTP IN LUQ &  IN THE EPIGASTRIUM, MILD TTP IN R/LLQ  Musculoskeletal: She exhibits no edema.  Lymphadenopathy:    She has no cervical adenopathy.  Neurological: She is alert and oriented to person, place, and time.  NO  NEW FOCAL DEFICITS  Psychiatric:  FLAT AFFECT, SLIGHTLY ANXIOUS MOOD  Vitals reviewed.     Assessment & Plan:

## 2016-02-02 NOTE — Telephone Encounter (Signed)
Pt called asking what would we recommend her take for her stomach pain. Please advise and call [phone]

## 2016-02-02 NOTE — Telephone Encounter (Signed)
PLEASE CALL PT. HER LOWER ABDOMNIAL PAIN CAN BE ASSOCIATED WITH CONSTIPATION AND ANXIETY/STRESS. SHE SHOULD TAKE HER LINZESS. NO OTHER SOURCE FOR HER ABDOMINAL PAIN HAS BEEN IDENTIFIED. IT IS LIKELY DUE TO HER REDUNDANT LEFT COLON. THAT WAS MY IMPRESSION IN JAN 2016 AFTER HER COLONOSCOPY. HER CT SCAN APR 2017 DID NOT SHOW ANY OTHER REASON FOR HER LOWER ABDOMINAL PAIN. NO ADDITIONAL GI WORKUP IS AVAILABLE OR WARRANTED AT THIS TIME. IF SHE DOES NOT HAVE IMPROVEMENT WITH LINZESS THEN SHE SHOULD SEE HER PCP ABOUT A REFERRAL TO A GYNECOLOGIST OR A UROLOGIST.

## 2016-02-02 NOTE — Telephone Encounter (Signed)
Pt is aware.  

## 2016-02-02 NOTE — Telephone Encounter (Signed)
Pt said she has had abdominal pain in her lower abdomen since she had her upper endoscopy earlier today. She has not had a BM today, but usually has one daily. She is on Linzess and that helps. Please advise!

## 2016-02-04 ENCOUNTER — Encounter (HOSPITAL_COMMUNITY): Payer: Self-pay | Admitting: Gastroenterology

## 2016-02-06 ENCOUNTER — Encounter: Payer: Self-pay | Admitting: Licensed Clinical Social Worker

## 2016-02-06 ENCOUNTER — Other Ambulatory Visit: Payer: Self-pay | Admitting: Licensed Clinical Social Worker

## 2016-02-06 NOTE — Patient Outreach (Signed)
Assessment:  CSW spoke via phone with client on 02/06/16. CSW verified client identity. CSW received verbal permission from client on 02/06/16 for CSW to speak with client about current client needs.  Client has her prescribed medications and is taking medications as prescribd. She is eating well and sleeping well. She drives herself to many of her scheduled medical appointments. Also her spouse sometimes drives client to and from client medical appointments.  Client sees [name] as primary care doctor.  CSW reminded client of [facility] program support in the areas of social work, nursing and pharmacy.  Client has strong support from her spouse.  Client sees Dr. Barney Drain as gastroenterologist.  Client said she will be traveling on vacation in July of 2017. She said she was doing well with her needs. She said she likes to relax by visiting with family and friends. She has strong support from her church family.  CSW and client spoke of client care plan. CSW encouraged client to attend all scheduled client medical appointments in next 30 days with transport assistance arranged as needed.  CSW thanked client for phone conversation with CSW on 02/06/16.    Plan:  Client to attend all scheduled client medical appointments in next 30 days with transport assistance arranged as needed for client. CSW to call client in 4 weeks to assess needs of client.  Norva Riffle.Hurshel Bouillon MSW, LCSW Licensed Clinical Social Worker [facility] [phone]

## 2016-02-12 ENCOUNTER — Ambulatory Visit: Payer: Commercial Managed Care - HMO | Admitting: Nurse Practitioner

## 2016-02-17 DIAGNOSIS — H524 Presbyopia: Secondary | ICD-10-CM | POA: Diagnosis not present

## 2016-02-17 DIAGNOSIS — H52209 Unspecified astigmatism, unspecified eye: Secondary | ICD-10-CM | POA: Diagnosis not present

## 2016-02-17 DIAGNOSIS — Z0100 Encounter for examination of eyes and vision without abnormal findings: Secondary | ICD-10-CM | POA: Diagnosis not present

## 2016-02-17 DIAGNOSIS — H5203 Hypermetropia, bilateral: Secondary | ICD-10-CM | POA: Diagnosis not present

## 2016-02-17 DIAGNOSIS — H5210 Myopia, unspecified eye: Secondary | ICD-10-CM | POA: Diagnosis not present

## 2016-02-19 ENCOUNTER — Telehealth: Payer: Self-pay | Admitting: Gastroenterology

## 2016-02-19 NOTE — Telephone Encounter (Signed)
OV made °

## 2016-02-19 NOTE — Telephone Encounter (Signed)
Please call pt. HER stomach Bx shows gastritis.   FOLLOW A LOW FAT DIET. MEATS SHOULD BE BAKED, BROILED, OR BOILED. AVOID FRIED FOODS.   CONTINUE PROTONIX. TAKE 30 MINUTES PRIOR TO BREAKFAST.  AVOID TRIGGERS FOR GASTRITIS.   FOLLOW UP IN SEP 2017 E30 BLOATING/GASTRITIS.

## 2016-02-19 NOTE — Telephone Encounter (Signed)
PT is aware.

## 2016-02-19 NOTE — Telephone Encounter (Signed)
LM for pt to call

## 2016-02-27 ENCOUNTER — Encounter: Payer: Commercial Managed Care - HMO | Attending: Family Medicine | Admitting: Nutrition

## 2016-02-27 ENCOUNTER — Encounter: Payer: Self-pay | Admitting: Nutrition

## 2016-02-27 VITALS — Ht 65.0 in | Wt 252.0 lb

## 2016-02-27 DIAGNOSIS — R1013 Epigastric pain: Secondary | ICD-10-CM | POA: Diagnosis not present

## 2016-02-27 DIAGNOSIS — E119 Type 2 diabetes mellitus without complications: Secondary | ICD-10-CM | POA: Diagnosis not present

## 2016-02-27 DIAGNOSIS — Z713 Dietary counseling and surveillance: Secondary | ICD-10-CM | POA: Diagnosis not present

## 2016-02-27 DIAGNOSIS — Z6841 Body Mass Index (BMI) 40.0 and over, adult: Secondary | ICD-10-CM | POA: Insufficient documentation

## 2016-02-27 NOTE — Progress Notes (Deleted)
s Diabetes Self-Management Education  Visit Type: First/Initial  Appt. Start Time: 930 Appt. End Time: 1030  02/27/2016  [name], identified by name and date of birth, is a 70 y.o. female with a diagnosis of Diabetes: Type 2. Diabetes Type 2 . Dx:  4 years. On Glipiizide. Lives with her husband.  She does the cooking and shopping. She does a lot of the cooking and foods are baked, broiled and fried. Most meals are prepared at home. THis is the most she has weighted.  Physical activity: ADL. Lives travel. Has been going  Has a son who has DM complications of feet and eyes. Has been having low blood sugars a few times per week and has to eat to keep from low blood sugars which is causing weight gain. Eats late at night snacking. Willing to go back to [facility] and start exercising.  Diet is excessive and inconsistent to meet her nutritional needs.  Lab Results  Component Value Date   HGBA1C 5.8 01/16/2016   ASSESSMENT  Height 5\' 5"  (1.651 m), weight 252 lb (114.306 kg). Body mass index is 41.93 kg/(m^2).      Diabetes Self-Management Education - 02/27/16 1317    Visit Information   Visit Type First/Initial   Initial Visit   Diabetes Type Type 2   Are you taking your medications as prescribed? Yes   Date Diagnosed 2013   Health Coping   How would you rate your overall health? Good   Psychosocial Assessment   Patient Belief/Attitude about Diabetes Motivated to manage diabetes   Self-care barriers None   Self-management support Doctor's office;Friends;Family   Other persons present Patient;Spouse/SO   Patient Concerns Nutrition/Meal planning;Healthy Lifestyle;Glycemic Control;Weight Control;Medication   Special Needs None   Preferred Learning Style No preference indicated   Learning Readiness Change in progress   How often do you need to have someone help you when you read instructions, pamphlets, or other written materials from your doctor or pharmacy? 1 - Never   What  is the last grade level you completed in school? 12   Pre-Education Assessment   Patient understands the diabetes disease and treatment process. Needs Instruction   Patient understands incorporating nutritional management into lifestyle. Needs Instruction   Patient undertands incorporating physical activity into lifestyle. Needs Instruction   Patient understands using medications safely. Needs Instruction   Patient understands monitoring blood glucose, interpreting and using results Needs Instruction   Patient understands prevention, detection, and treatment of acute complications. Needs Instruction   Patient understands prevention, detection, and treatment of chronic complications. Needs Instruction   Patient understands how to develop strategies to address psychosocial issues. Needs Instruction   Patient understands how to develop strategies to promote health/change behavior. Needs Instruction   Complications   Last HgB A1C per patient/outside source 5.8 %   How often do you check your blood sugar? 1-2 times/day   Fasting Blood glucose range (mg/dL) 70-129   Postprandial Blood glucose range (mg/dL) 130-179   Number of hypoglycemic episodes per month 3   Number of hyperglycemic episodes per week 0   Have you had a dilated eye exam in the past 12 months? No   Have you had a dental exam in the past 12 months? No   Are you checking your feet? Yes   Dietary Intake   Breakfast oatmeal and egg or cereal an dmilk   Snack (morning) fruit   Lunch skips or fast food sometimes   Snack (afternoon) fruit or  chips or sweetts   Actuary   Snack (evening) misc, cookies   Beverage(s) water-little, diet sodas, juices, lemonade   Exercise   Exercise Type ADL's   Patient Education   Previous Diabetes Education No   Disease state  Definition of diabetes, type 1 and 2, and the diagnosis of diabetes   Nutrition management  Role of diet in the treatment of diabetes and the relationship  between the three main macronutrients and blood glucose level;Food label reading, portion sizes and measuring food.;Carbohydrate counting;Reviewed blood glucose goals for pre and post meals and how to evaluate the patients' food intake on their blood glucose level.;Meal timing in regards to the patients' current diabetes medication.;Information on hints to eating out and maintain blood glucose control.   Physical activity and exercise  Role of exercise on diabetes management, blood pressure control and cardiac health.;Helped patient identify appropriate exercises in relation to his/her diabetes, diabetes complications and other health issue.   Monitoring Purpose and frequency of SMBG.;Taught/discussed recording of test results and interpretation of SMBG.   Chronic complications Relationship between chronic complications and blood glucose control;Identified and discussed with patient  current chronic complications;Assessed and discussed foot care and prevention of foot problems   Psychosocial adjustment Worked with patient to identify barriers to care and solutions;Helped patient identify a support system for diabetes management   Personal strategies to promote health Lifestyle issues that need to be addressed for better diabetes care;Helped patient develop diabetes management plan for (enter comment)   Individualized Goals (developed by patient)   Nutrition Follow meal plan discussed;General guidelines for healthy choices and portions discussed;Adjust meds/carbs with exercise as discussed   Physical Activity Exercise 3-5 times per week   Medications take my medication as prescribed   Monitoring  test my blood glucose as discussed   Reducing Risk examine blood glucose patterns;do foot checks daily   Health Coping ask for help with (comment)  meal planning   Post-Education Assessment   Patient understands the diabetes disease and treatment process. Needs Review   Patient understands incorporating  nutritional management into lifestyle. Needs Review   Patient undertands incorporating physical activity into lifestyle. Needs Review   Patient understands using medications safely. Needs Review   Patient understands monitoring blood glucose, interpreting and using results Needs Review   Patient understands prevention, detection, and treatment of acute complications. Needs Review   Patient understands prevention, detection, and treatment of chronic complications. Needs Review   Patient understands how to develop strategies to address psychosocial issues. Needs Review   Patient understands how to develop strategies to promote health/change behavior. Needs Review   Outcomes   Expected Outcomes Demonstrated interest in learning. Expect positive outcomes   Future DMSE 4-6 wks   Program Status Not Completed      Individualized Plan for Diabetes Self-Management Training:   Learning Objective:  Patient will have a greater understanding of diabetes self-management. Patient education plan is to attend individual and/or group sessions per assessed needs and concerns.   Plan:   Patient Instructions  Goals: 1. Follow My Plate 2. Increase fresh fruits and vegetables. 3. Cut out juices, tea, diet sodas 4. Avoid sweets and snacks as discussed. 5. Drink water only 6. Exercise 30 minutes a day as tolerated. 7. Get FBS less than 130 mg/dl 9. Avoid low blood sugars < 70. Call MD if having lows so he can stop Glipizide 10. Lose 1-2 lbs per week.     Expected Outcomes:  Demonstrated interest  in learning. Expect positive outcomes  Education material provided: Living Well with Diabetes, Meal plan card, My Plate and Carbohydrate counting sheet  If problems or questions, patient to contact team via:  Phone and Email  Future DSME appointment: 4-6 wks

## 2016-02-27 NOTE — Progress Notes (Signed)
Diabetes Self-Management Education  Visit Type:  First/Initial  Appt. Start Time: 0930 Appt. End Time: 1030  02/27/2016  [name], identified by name and date of birth, is a 70 y.o. female with a diagnosis of Diabetes: Type 2.. Dx:  4 years. On Glipiizide. Lives with her husband.  She does the cooking and shopping. She does a lot of the cooking and foods are baked, broiled and fried. Most meals are prepared at home. Ths is the most she has weighted.  Physical activity: ADL. Lives travel. Has been going  Has a son who has DM complications of feet and eyes. She notes she has been having quite a few low blood sugars and has to eat to keep from having lows at times. Meals are inconsistent. Skips meal and eats a lot at night after supper. Not exercising. Willing to start back at the [facility].  Lab Results  Component Value Date   HGBA1C 5.8 01/16/2016     ASSESSMENT  Height 5\' 5"  (1.651 m), weight 252 lb (114.306 kg). Body mass index is 41.93 kg/(m^2).       Diabetes Self-Management Education - 02/27/16 1317    Health Coping   How would you rate your overall health? Good   Psychosocial Assessment   Patient Belief/Attitude about Diabetes Motivated to manage diabetes   Self-care barriers None   Self-management support Doctor's office;Friends;Family   Patient Concerns Nutrition/Meal planning;Healthy Lifestyle;Glycemic Control;Weight Control;Medication   Special Needs None   Preferred Learning Style No preference indicated   Learning Readiness Change in progress   Pre-Education Assessment   Patient understands the diabetes disease and treatment process. Needs Instruction   Patient understands incorporating nutritional management into lifestyle. Needs Instruction   Patient undertands incorporating physical activity into lifestyle. Needs Instruction   Patient understands using medications safely. Needs Instruction   Patient understands monitoring blood glucose, interpreting and  using results Needs Instruction   Patient understands prevention, detection, and treatment of acute complications. Needs Instruction   Patient understands prevention, detection, and treatment of chronic complications. Needs Instruction   Patient understands how to develop strategies to address psychosocial issues. Needs Instruction   Patient understands how to develop strategies to promote health/change behavior. Needs Instruction   Complications   Last HgB A1C per patient/outside source 5.8 %   How often do you check your blood sugar? 1-2 times/day   Fasting Blood glucose range (mg/dL) 70-129   Postprandial Blood glucose range (mg/dL) 130-179   Number of hypoglycemic episodes per month 3   Number of hyperglycemic episodes per week 0   Have you had a dilated eye exam in the past 12 months? No   Have you had a dental exam in the past 12 months? No   Are you checking your feet? Yes   Dietary Intake   Breakfast oatmeal and egg or cereal an dmilk   Snack (morning) fruit   Lunch skips or fast food sometimes   Snack (afternoon) fruit or chips or sweetts   Insurance risk surveyor veggies   Snack (evening) misc, cookies   Beverage(s) water-little, diet sodas, juices, lemonade   Exercise   Exercise Type ADL's   Patient Education   Previous Diabetes Education No   Disease state  Definition of diabetes, type 1 and 2, and the diagnosis of diabetes   Nutrition management  Role of diet in the treatment of diabetes and the relationship between the three main macronutrients and blood glucose level;Food label reading, portion sizes and measuring  food.;Carbohydrate counting;Reviewed blood glucose goals for pre and post meals and how to evaluate the patients' food intake on their blood glucose level.;Meal timing in regards to the patients' current diabetes medication.;Information on hints to eating out and maintain blood glucose control.   Physical activity and exercise  Role of exercise on diabetes management,  blood pressure control and cardiac health.;Helped patient identify appropriate exercises in relation to his/her diabetes, diabetes complications and other health issue.   Monitoring Purpose and frequency of SMBG.;Taught/discussed recording of test results and interpretation of SMBG.   Chronic complications Relationship between chronic complications and blood glucose control;Identified and discussed with patient  current chronic complications;Assessed and discussed foot care and prevention of foot problems   Psychosocial adjustment Worked with patient to identify barriers to care and solutions;Helped patient identify a support system for diabetes management   Personal strategies to promote health Lifestyle issues that need to be addressed for better diabetes care;Helped patient develop diabetes management plan for (enter comment)   Individualized Goals (developed by patient)   Nutrition Follow meal plan discussed;General guidelines for healthy choices and portions discussed;Adjust meds/carbs with exercise as discussed   Physical Activity Exercise 3-5 times per week   Medications take my medication as prescribed   Monitoring  test my blood glucose as discussed   Reducing Risk examine blood glucose patterns;do foot checks daily   Health Coping ask for help with (comment)  meal planning   Post-Education Assessment   Patient understands the diabetes disease and treatment process. Needs Review   Patient understands incorporating nutritional management into lifestyle. Needs Review   Patient undertands incorporating physical activity into lifestyle. Needs Review   Patient understands using medications safely. Needs Review   Patient understands monitoring blood glucose, interpreting and using results Needs Review   Patient understands prevention, detection, and treatment of acute complications. Needs Review   Patient understands prevention, detection, and treatment of chronic complications. Needs Review    Patient understands how to develop strategies to address psychosocial issues. Needs Review   Patient understands how to develop strategies to promote health/change behavior. Needs Review   Outcomes   Program Status Not Completed      Learning Objective:  Patient will have a greater understanding of diabetes self-management. Patient education plan is to attend individual and/or group sessions per assessed needs and concerns.   Plan:   Patient Instructions  Goals: 1. Follow My Plate 2. Increase fresh fruits and vegetables. 3. Cut out juices, tea, diet sodas 4. Avoid sweets and snacks as discussed. 5. Drink water only 6. Exercise 30 minutes a day as tolerated. 7. Get FBS less than 130 mg/dl 9. Avoid low blood sugars < 70. Call MD if having lows so he can stop Glipizide 10. Lose 1-2 lbs per week.   Expected Outcomes:  Demonstrated interest in learning. Expect positive outcomes  Education material provided: Living Well with Diabetes, Meal plan card, My Plate and Carbohydrate counting sheet  If problems or questions, patient to contact team via:  Phone and Email  Future DSME appointment: - 4-6 wks

## 2016-02-27 NOTE — Patient Instructions (Signed)
Goals: 1. Follow My Plate 2. Increase fresh fruits and vegetables. 3. Cut out juices, tea, diet sodas 4. Avoid sweets and snacks as discussed. 5. Drink water only 6. Exercise 30 minutes a day as tolerated. 7. Get FBS less than 130 mg/dl 9. Avoid low blood sugars < 70. Call MD if having lows so he can stop Glipizide 10. Lose 1-2 lbs per week.

## 2016-02-27 NOTE — Progress Notes (Deleted)
  Medical Nutrition Therapy:  Appt start time: 0930 end time:  1030.   Assessment:  Primary concerns today: Diabetes Type 2 . Dx:  4 years. On Glipiizide. Lives with her husband.  She does the cooking and shopping. She does a lot of the cooking and foods are baked, broiled and fried. Most meals are prepared at home. THis is the most she has weighted.  Physical activity: ADL. Lives travel. Has been going  Has a son who has DM complications of feet and eyes.   Preferred Learning Style:   Auditory  Visual  Hands on  Learning Readiness:  Not ready  Contemplating  Ready  Change in progress   MEDICATIONS: ***   DIETARY INTAKE  24-hr recall:  B ( AM): Raisin bran with milk, ham or sausage biscuit,  Snk ( AM):   L ( PM): fast foods, water or diet soda Snk ( PM):  D ( PM): Hot dogs- 1 with bun, chili and slaw and 2 cookies,  Cranberry juice,  Snk ( PM): *** Beverages: Water, Diet soda, or lemonade  Usual physical activity: ADL.  Estimated energy needs: *** calories *** g carbohydrates *** g protein *** g fat  Progress Towards Goal(s):  {Desc; Goals Progress:21388}.   Nutritional Diagnosis:  {CHL AMB NUTRITIONAL [phone]}    Intervention:  Nutrition ***.  Teaching Method Utilized: *** Visual Auditory Hands on  Handouts given during visit include:  ***  ***  Barriers to learning/adherence to lifestyle change: ***  Demonstrated degree of understanding via:  Teach Back   Monitoring/Evaluation:  Dietary intake, exercise, ***, and body weight {follow up:15908}.

## 2016-03-09 ENCOUNTER — Encounter: Payer: Self-pay | Admitting: Licensed Clinical Social Worker

## 2016-03-09 ENCOUNTER — Other Ambulatory Visit: Payer: Self-pay | Admitting: Licensed Clinical Social Worker

## 2016-03-09 NOTE — Patient Outreach (Signed)
Assessment:  CSW spoke via phone with client on 03/09/16.  CSW verified client identity. CSW received verbal permission from client on 03/09/16 for CSW to speak with client about current needs of client.CSW and client spoke of client needs. Client sometimes drives herself to her medical appointments. Sometimes spouse of client drives client to her scheduled medical appointments. Client has medications and is taking medications as prescribed. Client sees [name] as her primary care doctor. Client is trying to attend scheduled client medical appointments. Client said she is eating well and sleeping well. Client sees Dr. Barney Drain as gastroenterologist, as scheduled. CSW and client spoke of client care plan. CSW encouraged client to attend all scheduled client medical appointments in next 30 days. Client said she has support from her church family. She enjoys also visiting with family and friends as a means of relaxation.  CSW spoke with client about fact that client had met her care plan goals with CSW services. Thus, since client has met her care plan goals with CSW services, CSW informed client that CSW was discharging client on 03/09/16 from [place] services. Client agreed to this plan. She said she had no other CSW needs at present. CSW congratulated client on her cooperation with [facility] CSW program in recent months. . Client was appreciative of support she received from [name] in recent months.  Plan:  Client to attend all scheduled client medical appointments in the next 30 days. CSW is discharging [name] from [facility] CSW services on 03/09/16 since client has met her care plan goals with CSW services.  CSW to inform [name] that [name] discharged client on 03/09/16 from [place] services. CSW to fax physician case closure letter to [name] informing [name] that [name] discharged client on 03/09/16 from [facility] CSW services.  [name].[name] MSW, LCSW Licensed Clinical Social  Worker [facility] Care Management [phone]

## 2016-03-15 ENCOUNTER — Other Ambulatory Visit: Payer: Self-pay | Admitting: Family Medicine

## 2016-03-22 ENCOUNTER — Encounter: Payer: Commercial Managed Care - HMO | Attending: Family Medicine | Admitting: Nutrition

## 2016-03-22 ENCOUNTER — Encounter: Payer: Self-pay | Admitting: Nutrition

## 2016-03-22 DIAGNOSIS — E119 Type 2 diabetes mellitus without complications: Secondary | ICD-10-CM | POA: Diagnosis not present

## 2016-03-22 DIAGNOSIS — Z713 Dietary counseling and surveillance: Secondary | ICD-10-CM | POA: Diagnosis not present

## 2016-03-22 DIAGNOSIS — Z6841 Body Mass Index (BMI) 40.0 and over, adult: Secondary | ICD-10-CM | POA: Diagnosis not present

## 2016-03-22 DIAGNOSIS — R1013 Epigastric pain: Secondary | ICD-10-CM | POA: Diagnosis not present

## 2016-03-22 NOTE — Patient Instructions (Signed)
Goals 1. Work with weekly calender to schedule exercise three times per week. 2. Talk to DR. Luking about Glipizide and only taking it in am. 3. Lose 3 lbs per month. Keep A1C 5.8% or less.

## 2016-03-22 NOTE — Progress Notes (Signed)
Diabetes Self-Management Education - 03/22/16 1015      Health Coping   How would you rate your overall health? Good     Psychosocial Assessment   Patient Belief/Attitude about Diabetes Motivated to manage diabetes   Self-care barriers None   Self-management support Friends;Family;Doctor's office   Patient Concerns Healthy Lifestyle;Weight Control   Special Needs None   Preferred Learning Style No preference indicated   Learning Readiness Change in progress     Pre-Education Assessment   Patient understands the diabetes disease and treatment process. Demonstrates understanding / competency   Patient understands incorporating nutritional management into lifestyle. Needs Review   Patient undertands incorporating physical activity into lifestyle. Needs Review   Patient understands using medications safely. Demonstrates understanding / competency   Patient understands monitoring blood glucose, interpreting and using results Demonstrates understanding / competency   Patient understands prevention, detection, and treatment of acute complications. Demonstrates understanding / competency   Patient understands prevention, detection, and treatment of chronic complications. Demonstrates understanding / competency   Patient understands how to develop strategies to address psychosocial issues. Demonstrates understanding / competency   Patient understands how to develop strategies to promote health/change behavior. Demonstrates understanding / competency     Complications   How often do you check your blood sugar? 1-2 times/day   Fasting Blood glucose range (mg/dL) 70-129   Postprandial Blood glucose range (mg/dL) 70-129   Are you checking your feet? Yes     Dietary Intake   Breakfast same   Lunch PB and 2 rice cakes, fruit and water   Snack (afternoon) fruit or yogurt sometimes.   Dinner salad with eggs, cheese and veggies, water   Beverage(s) water     Exercise   Exercise Type ADL's    How many days per week to you exercise? 0     Patient Education   Disease state  Explored patient's options for treatment of their diabetes   Nutrition management  Food label reading, portion sizes and measuring food.;Carbohydrate counting   Physical activity and exercise  Role of exercise on diabetes management, blood pressure control and cardiac health.;Identified with patient nutritional and/or medication changes necessary with exercise.;Helped patient identify appropriate exercises in relation to his/her diabetes, diabetes complications and other health issue.   Monitoring Purpose and frequency of SMBG.   Acute complications Taught treatment of hypoglycemia - the 15 rule.   Chronic complications Assessed and discussed foot care and prevention of foot problems;Relationship between chronic complications and blood glucose control   Psychosocial adjustment Role of stress on diabetes;Travel strategies   Personal strategies to promote health Lifestyle issues that need to be addressed for better diabetes care;Helped patient develop diabetes management plan for (enter comment)     Individualized Goals (developed by patient)   Physical Activity Exercise 3-5 times per week   Medications take my medication as prescribed   Monitoring  test my blood glucose as discussed   Reducing Risk do foot checks daily;treat hypoglycemia with 15 grams of carbs if blood glucose less than 70mg /dL;examine blood glucose patterns     Patient Self-Evaluation of Goals - Patient rates self as meeting previously set goals (% of time)   Nutrition >75%   Physical Activity >75%   Medications >75%   Monitoring >75%   Problem Solving >75%   Reducing Risk >75%   Health Coping >75%     Post-Education Assessment   Patient understands the diabetes disease and treatment process. Demonstrates understanding / competency  Patient understands incorporating nutritional management into lifestyle. Demonstrates understanding /  competency   Patient undertands incorporating physical activity into lifestyle. Demonstrates understanding / competency   Patient understands using medications safely. Demonstrates understanding / competency   Patient understands monitoring blood glucose, interpreting and using results Demonstrates understanding / competency   Patient understands prevention, detection, and treatment of acute complications. Demonstrates understanding / competency   Patient understands prevention, detection, and treatment of chronic complications. Demonstrates understanding / competency   Patient understands how to develop strategies to address psychosocial issues. Demonstrates understanding / competency   Patient understands how to develop strategies to promote health/change behavior. Demonstrates understanding / competency     Outcomes   Program Status Completed     Subsequent Visit   Since your last visit have you had your blood pressure checked? Yes   Is your most recent blood pressure lower, unchanged, or higher since your last visit? Lower   Since your last visit have you experienced any weight changes? Loss   Weight Loss (lbs) 2   Since your last visit, are you checking your blood glucose at least once a day? Yes

## 2016-04-12 ENCOUNTER — Other Ambulatory Visit: Payer: Self-pay | Admitting: Family Medicine

## 2016-04-16 ENCOUNTER — Ambulatory Visit: Payer: Commercial Managed Care - HMO | Admitting: Family Medicine

## 2016-04-23 ENCOUNTER — Encounter: Payer: Self-pay | Admitting: Family Medicine

## 2016-04-23 ENCOUNTER — Ambulatory Visit (INDEPENDENT_AMBULATORY_CARE_PROVIDER_SITE_OTHER): Payer: Commercial Managed Care - HMO | Admitting: Family Medicine

## 2016-04-23 VITALS — BP 126/80 | Ht 65.0 in | Wt 243.5 lb

## 2016-04-23 DIAGNOSIS — E119 Type 2 diabetes mellitus without complications: Secondary | ICD-10-CM | POA: Diagnosis not present

## 2016-04-23 DIAGNOSIS — E7800 Pure hypercholesterolemia, unspecified: Secondary | ICD-10-CM

## 2016-04-23 DIAGNOSIS — K5900 Constipation, unspecified: Secondary | ICD-10-CM | POA: Diagnosis not present

## 2016-04-23 DIAGNOSIS — K589 Irritable bowel syndrome without diarrhea: Secondary | ICD-10-CM

## 2016-04-23 LAB — POCT GLYCOSYLATED HEMOGLOBIN (HGB A1C): HEMOGLOBIN A1C: 5.7

## 2016-04-23 MED ORDER — HYDROCHLOROTHIAZIDE 12.5 MG PO TABS: 12.5000 mg | ORAL_TABLET | Freq: Every day | ORAL | 1 refills | Status: DC

## 2016-04-23 MED ORDER — GLIPIZIDE 5 MG PO TABS: 2.5000 mg | ORAL_TABLET | Freq: Every day | ORAL | 1 refills | Status: DC

## 2016-04-23 MED ORDER — PRAVASTATIN SODIUM 20 MG PO TABS: 20.0000 mg | ORAL_TABLET | Freq: Every day | ORAL | 1 refills | Status: DC

## 2016-04-23 MED ORDER — GLUCOSE BLOOD VI STRP
ORAL_STRIP | 3 refills | Status: DC
Start: 2016-04-23 — End: 2016-07-30

## 2016-04-23 NOTE — Progress Notes (Signed)
   Subjective:    Patient ID: [name], female    DOB: Dec 18, 1945, 70 y.o.   MRN: [id number]  Diabetes  She presents for her follow-up diabetic visit. She has type 2 diabetes mellitus. There are no hypoglycemic associated symptoms. Pertinent negatives for hypoglycemia include no confusion. There are no diabetic associated symptoms. Pertinent negatives for diabetes include no chest pain, no fatigue, no polydipsia, no polyphagia and no weakness. There are no hypoglycemic complications. There are no diabetic complications. There are no known risk factors for coronary artery disease. Current diabetic treatment includes oral agent (monotherapy). She is compliant with treatment all of the time.   Patient states that she has pulled a muscle in her back. Onset 1 month ago.  she states she did this during water aerobics.  Patient states that when she eats she has abdominal pain. Onset 1 month ago.  This patient has been followed up by gastroenterology and had thorough evaluation. She's had EGD colonoscopy lab work. Gets intermittent abdominal pain sometimes in the right lower abdomen. Comes and goes. No swelling. No vomiting or diarrhea. No bloody stools.  Results for orders placed or performed in visit on 04/23/16  POCT glycosylated hemoglobin (Hb A1C)  Result Value Ref Range   Hemoglobin A1C 5.7      Review of Systems  Constitutional: Negative for activity change, appetite change and fatigue.  HENT: Negative for congestion.   Respiratory: Negative for cough.   Cardiovascular: Negative for chest pain.  Gastrointestinal: Negative for abdominal pain.  Endocrine: Negative for polydipsia and polyphagia.  Musculoskeletal: Positive for back pain.  Neurological: Negative for weakness.  Psychiatric/Behavioral: Negative for confusion.       Objective:   Physical Exam  Constitutional: She appears well-nourished. No distress.  Cardiovascular: Normal rate, regular rhythm and normal heart  sounds.   No murmur heard. Pulmonary/Chest: Effort normal and breath sounds normal. No respiratory distress.  Musculoskeletal: She exhibits no edema.  Lymphadenopathy:    She has no cervical adenopathy.  Neurological: She is alert. She exhibits normal muscle tone.  Psychiatric: Her behavior is normal.  Vitals reviewed.         Assessment & Plan:   patient to continue do exercise watching diet closely Hyperlipidemia lab work reviewed with patient from previous cholesterol continue cholesterol medicine  diabetes actually A1c looks very good. She is to reduce her medication to just taking a half a tablet in the morning if ongoing improvement in A1c stays that we may need to stop medicine altogether  Obesity patient encouraged watch diet lose weight IBS stable. Continue current measures. If the abdominal pain continues she needs let us know and we would get her back in with gastroenterologyBack strainshe is to avoid exercises that may cause greater trouble

## 2016-05-04 ENCOUNTER — Emergency Department (HOSPITAL_COMMUNITY): Payer: Commercial Managed Care - HMO

## 2016-05-04 ENCOUNTER — Encounter (HOSPITAL_COMMUNITY): Payer: Self-pay | Admitting: Emergency Medicine

## 2016-05-04 ENCOUNTER — Observation Stay (HOSPITAL_COMMUNITY)
Admission: EM | Admit: 2016-05-04 | Discharge: 2016-05-06 | Disposition: A | Discharge status: Home / Self Care | Payer: Commercial Managed Care - HMO | Attending: Internal Medicine | Admitting: Internal Medicine

## 2016-05-04 DIAGNOSIS — E119 Type 2 diabetes mellitus without complications: Secondary | ICD-10-CM | POA: Insufficient documentation

## 2016-05-04 DIAGNOSIS — R109 Unspecified abdominal pain: Secondary | ICD-10-CM | POA: Diagnosis present

## 2016-05-04 DIAGNOSIS — Z79899 Other long term (current) drug therapy: Secondary | ICD-10-CM | POA: Diagnosis not present

## 2016-05-04 DIAGNOSIS — R0902 Hypoxemia: Secondary | ICD-10-CM

## 2016-05-04 DIAGNOSIS — K5792 Diverticulitis of intestine, part unspecified, without perforation or abscess without bleeding: Secondary | ICD-10-CM

## 2016-05-04 DIAGNOSIS — E1140 Type 2 diabetes mellitus with diabetic neuropathy, unspecified: Secondary | ICD-10-CM

## 2016-05-04 DIAGNOSIS — K5732 Diverticulitis of large intestine without perforation or abscess without bleeding: Secondary | ICD-10-CM | POA: Diagnosis not present

## 2016-05-04 DIAGNOSIS — Z7984 Long term (current) use of oral hypoglycemic drugs: Secondary | ICD-10-CM | POA: Diagnosis not present

## 2016-05-04 DIAGNOSIS — I10 Essential (primary) hypertension: Secondary | ICD-10-CM | POA: Diagnosis not present

## 2016-05-04 LAB — URINALYSIS, ROUTINE W REFLEX MICROSCOPIC
Bilirubin Urine: NEGATIVE
GLUCOSE, UA: NEGATIVE mg/dL
HGB URINE DIPSTICK: NEGATIVE
Ketones, ur: NEGATIVE mg/dL
Leukocytes, UA: NEGATIVE
Nitrite: NEGATIVE
PROTEIN: NEGATIVE mg/dL
Specific Gravity, Urine: <1.005 — ABNORMAL LOW (ref 1.005–1.030)
pH: 5.5 (ref 5.0–8.0)

## 2016-05-04 LAB — COMPREHENSIVE METABOLIC PANEL
ALT: 18 U/L (ref 14–54)
AST: 19 U/L (ref 15–41)
Albumin: 4.1 g/dL (ref 3.5–5.0)
Alkaline Phosphatase: 77 U/L (ref 38–126)
Anion gap: 7 (ref 5–15)
BUN: 15 mg/dL (ref 6–20)
CHLORIDE: 102 mmol/L (ref 101–111)
CO2: 28 mmol/L (ref 22–32)
CREATININE: 0.9 mg/dL (ref 0.44–1.00)
Calcium: 9.2 mg/dL (ref 8.9–10.3)
GFR calc Af Amer: >60 mL/min (ref >60)
GFR calc non Af Amer: >60 mL/min (ref >60)
GLUCOSE: 110 mg/dL — AB (ref 65–99)
Potassium: 3.7 mmol/L (ref 3.5–5.1)
SODIUM: 137 mmol/L (ref 135–145)
Total Bilirubin: 1 mg/dL (ref 0.3–1.2)
Total Protein: 7.8 g/dL (ref 6.5–8.1)

## 2016-05-04 LAB — CBC
HCT: 41.2 % (ref 36.0–46.0)
Hemoglobin: 13.5 g/dL (ref 12.0–15.0)
MCH: 25.1 pg — AB (ref 26.0–34.0)
MCHC: 32.8 g/dL (ref 30.0–36.0)
MCV: 76.7 fL — AB (ref 78.0–100.0)
PLATELETS: 296 10*3/uL (ref 150–400)
RBC: 5.37 MIL/uL — ABNORMAL HIGH (ref 3.87–5.11)
RDW: 16.7 % — AB (ref 11.5–15.5)
WBC: 13.4 10*3/uL — ABNORMAL HIGH (ref 4.0–10.5)

## 2016-05-04 LAB — GLUCOSE, CAPILLARY
GLUCOSE-CAPILLARY: 81 mg/dL (ref 65–99)
GLUCOSE-CAPILLARY: 89 mg/dL (ref 65–99)
Glucose-Capillary: 88 mg/dL (ref 65–99)

## 2016-05-04 LAB — LIPASE, BLOOD: LIPASE: 24 U/L (ref 11–51)

## 2016-05-04 MED ORDER — DIPHENOXYLATE-ATROPINE 2.5-0.025 MG PO TABS: 1.0000 | ORAL_TABLET | Freq: Four times a day (QID) | ORAL | Status: DC | PRN

## 2016-05-04 MED ORDER — SODIUM CHLORIDE 0.9 % IV SOLN
Freq: Once | INTRAVENOUS | Status: AC
  Administered 2016-05-04: 08:00:00 via INTRAVENOUS

## 2016-05-04 MED ORDER — PRAVASTATIN SODIUM 10 MG PO TABS
20.0000 mg | ORAL_TABLET | Freq: Every day | ORAL | Status: DC
  Administered 2016-05-04 – 2016-05-06 (×3): 20 mg via ORAL
  Filled 2016-05-04 (×3): qty 2

## 2016-05-04 MED ORDER — ONDANSETRON HCL 4 MG/2ML IJ SOLN
4.0000 mg | Freq: Once | INTRAMUSCULAR | Status: AC
  Administered 2016-05-04: 4 mg via INTRAVENOUS
  Filled 2016-05-04: qty 2

## 2016-05-04 MED ORDER — METRONIDAZOLE IN NACL 5-0.79 MG/ML-% IV SOLN
500.0000 mg | Freq: Once | INTRAVENOUS | Status: AC
  Administered 2016-05-04: 500 mg via INTRAVENOUS
  Filled 2016-05-04: qty 100

## 2016-05-04 MED ORDER — SODIUM CHLORIDE 0.9 % IV SOLN: INTRAVENOUS | Status: AC

## 2016-05-04 MED ORDER — INSULIN ASPART 100 UNIT/ML ~~LOC~~ SOLN
0.0000 [IU] | Freq: Three times a day (TID) | SUBCUTANEOUS | Status: DC
Start: 2016-05-04 — End: 2016-05-06
  Administered 2016-05-05: 1 [IU] via SUBCUTANEOUS

## 2016-05-04 MED ORDER — ACETAMINOPHEN 325 MG PO TABS
650.0000 mg | ORAL_TABLET | Freq: Four times a day (QID) | ORAL | Status: DC | PRN
  Administered 2016-05-06: 650 mg via ORAL
  Filled 2016-05-04: qty 2

## 2016-05-04 MED ORDER — MORPHINE SULFATE (PF) 4 MG/ML IV SOLN
4.0000 mg | INTRAVENOUS | Status: AC | PRN
  Administered 2016-05-04 (×3): 4 mg via INTRAVENOUS
  Filled 2016-05-04 (×3): qty 1

## 2016-05-04 MED ORDER — MORPHINE SULFATE (PF) 4 MG/ML IV SOLN: 5.0000 mg | Freq: Once | INTRAVENOUS | Status: DC

## 2016-05-04 MED ORDER — MORPHINE SULFATE (PF) 2 MG/ML IV SOLN
1.0000 mg | INTRAVENOUS | Status: DC | PRN
  Administered 2016-05-05 (×2): 1 mg via INTRAVENOUS
  Filled 2016-05-04 (×3): qty 1

## 2016-05-04 MED ORDER — HYDROCHLOROTHIAZIDE 25 MG PO TABS
12.5000 mg | ORAL_TABLET | Freq: Every day | ORAL | Status: DC
  Administered 2016-05-05 – 2016-05-06 (×2): 12.5 mg via ORAL
  Filled 2016-05-04 (×2): qty 1

## 2016-05-04 MED ORDER — IOPAMIDOL (ISOVUE-300) INJECTION 61%
100.0000 mL | Freq: Once | INTRAVENOUS | Status: AC | PRN
  Administered 2016-05-04: 100 mL via INTRAVENOUS

## 2016-05-04 MED ORDER — LOSARTAN POTASSIUM 50 MG PO TABS
50.0000 mg | ORAL_TABLET | Freq: Every day | ORAL | Status: DC
  Administered 2016-05-05 – 2016-05-06 (×2): 50 mg via ORAL
  Filled 2016-05-04 (×2): qty 1

## 2016-05-04 MED ORDER — DICYCLOMINE HCL 10 MG PO CAPS: 20.0000 mg | ORAL_CAPSULE | Freq: Three times a day (TID) | ORAL | Status: DC | PRN

## 2016-05-04 MED ORDER — ONDANSETRON 4 MG PO TBDP: 8.0000 mg | ORAL_TABLET | Freq: Three times a day (TID) | ORAL | Status: DC | PRN

## 2016-05-04 MED ORDER — ACETAMINOPHEN 650 MG RE SUPP: 650.0000 mg | Freq: Four times a day (QID) | RECTAL | Status: DC | PRN

## 2016-05-04 MED ORDER — ENOXAPARIN SODIUM 40 MG/0.4ML ~~LOC~~ SOLN
40.0000 mg | SUBCUTANEOUS | Status: DC
  Administered 2016-05-04 – 2016-05-06 (×3): 40 mg via SUBCUTANEOUS
  Filled 2016-05-04 (×3): qty 0.4

## 2016-05-04 MED ORDER — LINACLOTIDE 145 MCG PO CAPS
145.0000 ug | ORAL_CAPSULE | Freq: Every day | ORAL | Status: DC
  Administered 2016-05-05 – 2016-05-06 (×2): 145 ug via ORAL
  Filled 2016-05-04 (×2): qty 1

## 2016-05-04 MED ORDER — PANTOPRAZOLE SODIUM 40 MG PO TBEC
40.0000 mg | DELAYED_RELEASE_TABLET | Freq: Every day | ORAL | Status: DC
  Administered 2016-05-04 – 2016-05-06 (×3): 40 mg via ORAL
  Filled 2016-05-04 (×3): qty 1

## 2016-05-04 MED ORDER — CIPROFLOXACIN IN D5W 400 MG/200ML IV SOLN
400.0000 mg | Freq: Once | INTRAVENOUS | Status: AC
  Administered 2016-05-04: 400 mg via INTRAVENOUS
  Filled 2016-05-04: qty 200

## 2016-05-04 MED ORDER — OXYCODONE HCL 5 MG PO TABS
5.0000 mg | ORAL_TABLET | ORAL | Status: DC | PRN
  Administered 2016-05-04 – 2016-05-06 (×3): 5 mg via ORAL
  Filled 2016-05-04 (×3): qty 1

## 2016-05-04 MED ORDER — METRONIDAZOLE IN NACL 5-0.79 MG/ML-% IV SOLN
500.0000 mg | Freq: Three times a day (TID) | INTRAVENOUS | Status: DC
  Administered 2016-05-05 – 2016-05-06 (×4): 500 mg via INTRAVENOUS
  Filled 2016-05-04 (×4): qty 100

## 2016-05-04 MED ORDER — CIPROFLOXACIN IN D5W 400 MG/200ML IV SOLN
400.0000 mg | Freq: Two times a day (BID) | INTRAVENOUS | Status: DC
  Administered 2016-05-04 – 2016-05-05 (×3): 400 mg via INTRAVENOUS
  Filled 2016-05-04 (×3): qty 200

## 2016-05-04 MED ORDER — IOPAMIDOL (ISOVUE-300) INJECTION 61%
INTRAVENOUS | Status: AC
  Filled 2016-05-04: qty 30

## 2016-05-04 NOTE — ED Provider Notes (Addendum)
[facility] DEPT Provider Note   CSN: [id number] Arrival date & time: 05/04/16  0726     History   Chief Complaint Chief Complaint  Patient presents with  . Abdominal Pain    HPI [name] is a 70 y.o. female.  She presents with complaint of abdominal pain and symptoms since Saturday, 3 days ago. He said a prior diverticulitis. Does admit that her pain is worse in her left lower quadrant. But describes her pain as diffuse. Nausea. No diarrhea. Normal bowel movements. No urinary symptoms. No bright red blood per rectum.  Past medical history significant for high cholesterol, depression, hypertension, peptic ulcer disease, arthritis, type 2 diabetes.    HPI  Past Medical History:  Diagnosis Date  . Blood clot of artery under arm ([place])   . Cyst on liver  . Depression   . Diabetes mellitus    controlled by diet  . Diverticulitis   . DVT of axillary vein, acute right 11/24/2012   Dx on Jan 28, 2012.    . Fibrocystic breast disease   . Hepatic cyst   . Hypertension   . IBS (irritable bowel syndrome)   . Impaired glucose tolerance   . Menopause     Patient Active Problem List   Diagnosis Date Noted  . Diverticulitis 05/04/2016  . IBS (irritable bowel syndrome) 12/29/2015  . Constipation 02/03/2015  . Dyspepsia   . Loss of weight   . Decreased appetite 09/16/2014  . Unintentional weight loss 09/16/2014  . Diabetes type 2, uncontrolled ([place]) 06/27/2013  . DVT of axillary vein, acute right 11/24/2012  . Diabetes mellitus type 2, diet-controlled ([place]) 11/15/2012  . Chest pain, unspecified 05/10/2011  . Bradycardia 05/10/2011  . HYPERCHOLESTEROLEMIA 08/29/2008  . DEPRESSION 08/29/2008  . Essential hypertension 08/29/2008  . PEPTIC ULCER DISEASE 08/29/2008  . HIATAL HERNIA 08/29/2008  . HEPATIC CYST 08/29/2008  . OSTEOARTHRITIS 08/29/2008    Past Surgical History:  Procedure Laterality Date  . ABDOMINAL HYSTERECTOMY    . BACK SURGERY    . BREAST  SURGERY  right   cyst removed  . CAST APPLICATION  [id number]   Procedure: CAST APPLICATION;  Surgeon: Carole Civil, MD;  Location: [facility] ORS;  Service: Orthopedics;  Laterality: Right;  procedure room   . CHOLECYSTECTOMY    . COLONOSCOPY  12/04/2004   NUR:Few small diverticula at sigmoid colon/Small cecal polyp ablated   . COLONOSCOPY N/A 09/24/2014   SLF: small internal hemorrhoids  . ESOPHAGOGASTRODUODENOSCOPY N/A 02/02/2016   Procedure: ESOPHAGOGASTRODUODENOSCOPY (EGD);  Surgeon: Danie Binder, MD;  Location: [facility] END[facility] SUITE;  Service: Endoscopy;  Laterality: N/A;  930   . KNEE ARTHROCENTESIS  left  . TUBAL LIGATION      OB History    Gravida Para Term Preterm AB Living   3 3 3     3    SAB TAB Ectopic Multiple Live Births                   Home Medications    Prior to Admission medications   Medication Sig Start Date End Date Taking? Authorizing Provider  acetaminophen (TYLENOL) 500 MG tablet Take 500 mg by mouth every 6 (six) hours as needed for mild pain or headache.    Yes Historical Provider, MD  Biotin w/ Vitamins C & E (HAIR SKIN & NAILS GUMMIES PO) Take 1 each by mouth 2 (two) times daily.   Yes Historical Provider, MD  Cholecalciferol (VITAMIN D3 PO) Take 5,000  Units by mouth daily.    Yes Historical Provider, MD  dicyclomine (BENTYL) 20 MG tablet Take 1 tablet (20 mg total) by mouth 3 (three) times daily as needed for spasms. 12/29/15  Yes [name], MD  diphenoxylate-atropine (LOMOTIL) 2.5-0.025 MG tablet Take 1 tablet by mouth 4 (four) times daily as needed for diarrhea or loose stools. 12/29/15  Yes [name], MD  glipiZIDE (GLUCOTROL) 5 MG tablet Take 0.5 tablets (2.5 mg total) by mouth daily before breakfast. 04/23/16  Yes [name], MD  hydrochlorothiazide (HYDRODIURIL) 12.5 MG tablet Take 1 tablet (12.5 mg total) by mouth daily. 04/23/16  Yes [name], MD  linaclotide (LINZESS) 145 MCG CAPS capsule 1 PO 30 mins prior to your first meal Patient  taking differently: Take 145 mcg by mouth daily before breakfast. 1 PO 30 mins prior to your first meal 01/22/16  Yes Danie Binder, MD  losartan (COZAAR) 50 MG tablet Take 1 tablet (50 mg total) by mouth daily. 07/02/15  Yes [name], MD  Multiple Vitamins-Minerals (MULTIVITAMIN WITH MINERALS) tablet Take 1 tablet by mouth daily.   Yes Historical Provider, MD  ondansetron (ZOFRAN ODT) 8 MG disintegrating tablet Take 1 tablet (8 mg total) by mouth every 8 (eight) hours as needed for nausea or vomiting. 12/29/15  Yes [name], MD  pravastatin (PRAVACHOL) 20 MG tablet Take 1 tablet (20 mg total) by mouth daily. 04/23/16  Yes [name], MD  glucose blood (TRUE METRIX BLOOD GLUCOSE TEST) test strip TEST BLOOD SUGAR ONE TIME DAILY 04/23/16   [name], MD  pantoprazole (PROTONIX) 40 MG tablet Take 1 tablet (40 mg total) by mouth daily. Patient not taking: Reported on 05/04/2016 01/02/16   Annitta Needs, NP  traZODone (DESYREL) 50 MG tablet Take one half to one tablet qhs Patient not taking: Reported on 05/04/2016 09/30/15   [name], MD    Family History Family History  Problem Relation Age of Onset  . Stroke Mother   . Heart attack Mother   . Cancer Sister   . Diabetes Sister   . Seizures Brother   . Diabetes Brother   . Heart disease Brother   . Colon cancer Neg Hx     Social History Social History  Substance Use Topics  . Smoking status: Never Smoker  . Smokeless tobacco: Never Used     Comment: Never smoked  . Alcohol use No     Allergies   Norvasc [amlodipine besylate]; Penicillins; Advair diskus [fluticasone-salmeterol]; and Metformin and related   Review of Systems Review of Systems  Constitutional: Negative for appetite change, chills, diaphoresis, fatigue and fever.  HENT: Negative for mouth sores, sore throat and trouble swallowing.   Eyes: Negative for visual disturbance.  Respiratory: Negative for cough, chest tightness, shortness of breath and  wheezing.   Cardiovascular: Negative for chest pain.  Gastrointestinal: Positive for abdominal pain. Negative for abdominal distention, diarrhea, nausea and vomiting.  Endocrine: Negative for polydipsia, polyphagia and polyuria.  Genitourinary: Negative for dysuria, frequency and hematuria.  Musculoskeletal: Negative for gait problem.  Skin: Negative for color change, pallor and rash.  Neurological: Negative for dizziness, syncope, light-headedness and headaches.  Hematological: Does not bruise/bleed easily.  Psychiatric/Behavioral: Negative for behavioral problems and confusion.     Physical Exam Updated Vital Signs BP (!) 110/53   Pulse 76   Temp 97.9 F (36.6 C) (Oral)   Resp 16   Ht 5\' 5"  (  1.651 m)   Wt 220 lb (99.8 kg)   SpO2 97%   BMI 36.61 kg/m   Physical Exam  Constitutional: She is oriented to person, place, and time. She appears well-developed and well-nourished. No distress.  HENT:  Head: Normocephalic.  Eyes: Conjunctivae are normal. Pupils are equal, round, and reactive to light. No scleral icterus.  Neck: Normal range of motion. Neck supple. No thyromegaly present.  Cardiovascular: Normal rate and regular rhythm.  Exam reveals no gallop and no friction rub.   No murmur heard. Pulmonary/Chest: Effort normal and breath sounds normal. No respiratory distress. She has no wheezes. She has no rales.  Abdominal: Soft. Bowel sounds are normal. She exhibits no distension. There is tenderness in the left lower quadrant. There is no rebound.  Musculoskeletal: Normal range of motion.  Neurological: She is alert and oriented to person, place, and time.  Skin: Skin is warm and dry. No rash noted.  Psychiatric: She has a normal mood and affect. Her behavior is normal.     ED Treatments / Results  Labs (all labs ordered are listed, but only abnormal results are displayed) Labs Reviewed  COMPREHENSIVE METABOLIC PANEL - Abnormal; Notable for the following:       Result  Value   Glucose, Bld 110 (*)    All other components within normal limits  CBC - Abnormal; Notable for the following:    WBC 13.4 (*)    RBC 5.37 (*)    MCV 76.7 (*)    MCH 25.1 (*)    RDW 16.7 (*)    All other components within normal limits  URINALYSIS, ROUTINE W REFLEX MICROSCOPIC (NOT AT [facility]) - Abnormal; Notable for the following:    APPearance HAZY (*)    Specific Gravity, Urine <1.005 (*)    All other components within normal limits  LIPASE, BLOOD    EKG  EKG Interpretation None       Radiology Ct Abdomen Pelvis W Contrast  Result Date: 05/04/2016 CLINICAL DATA:  70 year old female with left lower abdominal pain for 3 days with nausea. History of diverticulitis. History of diabetes, hypertension and hepatic cysts. Initial encounter. EXAM: CT ABDOMEN AND PELVIS WITH CONTRAST TECHNIQUE: Multidetector CT imaging of the abdomen and pelvis was performed using the standard protocol following bolus administration of intravenous contrast. CONTRAST:  110mL ISOVUE-300 IOPAMIDOL (ISOVUE-300) INJECTION 61% COMPARISON:  12/28/2015, 11/15/2012 and 03/13/2010 CT. FINDINGS: Lower chest:  No acute findings. Hepatobiliary: Post cholecystectomy. Fatty liver. Stable hepatic cysts, largest within the right lobe of the liver extending to the dome spanning over 14.5 x 10 x 10.5 cm. Pancreas: No mass, inflammatory changes, or other significant abnormality. Spleen: Within normal limits in size and appearance. Adrenals/Urinary Tract: No adrenal lesion. No hydronephrosis. Nonobstructing tiny left lower pole renal calculus. Left lower pole 6 mm low-density structure possibly a cyst too small to characterize and unchanged. Stomach/Bowel: Inflammation descending colon consistent with diverticulitis without drainable abscess. This segment of bowel with bowel wall thickening possibly related to inflammation. This can be assessed on follow-up to exclude underlying mass after inflammatory process has cleared.  Scattered diverticular throughout remainder of colon. No other bowel inflammatory process or free intraperitoneal air is noted. Vascular/Lymphatic: Small number of lymph nodes surround the mid descending colon diverticulitis. No evidence of abdominal aortic aneurysm. Atherosclerotic changes aorta are and branch vessels with scattered calcification. No high-grade stenosis. Reproductive: Post hysterectomy. Other: None. Musculoskeletal: Degenerative changes lower thoracic lumbar spine with scoliosis. Bilateral hip joint degenerative changes.  Stable appearance of nonspecific left iliac wing sclerotic process. Sacroiliac joint degenerative changes. IMPRESSION: Mid descending colon diverticulitis as detailed above. Stable hepatic cysts largest right lobe measuring up to 14.5 cm. Fatty liver. Aortic atherosclerosis. Electronically Signed   By: Genia Del M.D.   On: 05/04/2016 09:48    Procedures Procedures (including critical care time)  Medications Ordered in ED Medications  iopamidol (ISOVUE-300) 61 % injection (not administered)  ciprofloxacin (CIPRO) IVPB 400 mg (not administered)  metroNIDAZOLE (FLAGYL) IVPB 500 mg (500 mg Intravenous New Bag/Given 05/04/16 1144)  morphine 4 MG/ML injection 5 mg (not administered)  ondansetron (ZOFRAN) injection 4 mg (4 mg Intravenous Given 05/04/16 0753)  morphine 4 MG/ML injection 4 mg (4 mg Intravenous Given 05/04/16 1143)  0.9 %  sodium chloride infusion ( Intravenous New Bag/Given 05/04/16 0753)  iopamidol (ISOVUE-300) 61 % injection 100 mL (100 mLs Intravenous Contrast Given 05/04/16 0917)     Initial Impression / Assessment and Plan / ED Course  I have reviewed the triage vital signs and the nursing notes.  Pertinent labs & imaging results that were available during my care of the patient were reviewed by me and considered in my medical decision making (see chart for details).  Clinical Course   CT scan showing diverticulitis without complication. Patient  still with fair amount of pain requiring IV narcotics. We discussed inpatient versus outpatient treatment. She would prefer inpatient treatment based on her continued pain and symptoms. D/W Dr. Maudie Mercury of triad hospitalists, Bed requested.  Final Clinical Impressions(s) / ED Diagnoses   Final diagnoses:  Diverticulitis of large intestine without perforation or abscess without bleeding    New Prescriptions New Prescriptions   No medications on file     Tanna Furry, MD 05/04/16 [place], MD 07/01/16 [place], MD 08/14/16 [phone]

## 2016-05-04 NOTE — ED Triage Notes (Signed)
Pt c/o generalized abdominal pain with nausea since Sunday. PMH of diverticulitis. Denies V/D.

## 2016-05-04 NOTE — ED Notes (Signed)
Pt given CT oral contrast.

## 2016-05-04 NOTE — H&P (Signed)
TRH H&P   Patient Demographics:    [name], is a 70 y.o. female  MRN: [id number]   DOB - 04/26/1946  Admit Date - 05/04/2016  Outpatient Primary MD for the patient is [name], MD  Referring MD/NP/PA: Tanna Furry  Outpatient Specialists:  Barney Drain  Patient coming from: home  Chief Complaint  Patient presents with  . Abdominal Pain      HPI:    [name]  is a 70 y.o. female,  With hx of diverticulitis apparently c/o abdominal pain starting Sunday nite.  Left lower quadrant, sharp.  Slight nausea.  Denies fever, chills, emesis,  diarrhea, brbpr, black stool. Pt presented due to pain.   In ED. CT scan showed mild diverticulitis.  Pt will be admitted observation for pain control due to diveritculitis.      Review of systems:    In addition to the HPI above, No Fever-chills, No Headache, No changes with Vision or hearing, No problems swallowing food or Liquids, No Chest pain, Cough or Shortness of Breath, No Vommitting, Bowel movements are regular, No Blood in stool or Urine, No dysuria, No new skin rashes or bruises, No new joints pains-aches,  No new weakness, tingling, numbness in any extremity, No recent weight gain or loss, No polyuria, polydypsia or polyphagia, No significant Mental Stressors.  A full 10 point Review of Systems was done, except as stated above, all other Review of Systems were negative.   With Past History of the following :    Past Medical History:  Diagnosis Date  . Blood clot of artery under arm ([place])   . Cyst on liver  . Depression   . Diabetes mellitus    controlled by diet  . Diverticulitis   . DVT of axillary vein, acute right 11/24/2012   Dx on Jan 28, 2012.    . Fibrocystic breast disease   . Hepatic cyst   . Hypertension   . IBS (irritable bowel syndrome)   . Impaired glucose tolerance   .  Menopause       Past Surgical History:  Procedure Laterality Date  . ABDOMINAL HYSTERECTOMY    . BACK SURGERY    . BREAST SURGERY  right   cyst removed  . CAST APPLICATION  [id number]   Procedure: CAST APPLICATION;  Surgeon: Carole Civil, MD;  Location: [facility];  Service: Orthopedics;  Laterality: Right;  procedure room   . CHOLECYSTECTOMY    . COLONOSCOPY  12/04/2004   NUR:Few small diverticula at sigmoid colon/Small cecal polyp ablated   . COLONOSCOPY N/A 09/24/2014   SLF: small internal hemorrhoids  . ESOPHAGOGASTRODUODENOSCOPY N/A 02/02/2016   Procedure: ESOPHAGOGASTRODUODENOSCOPY (EGD);  Surgeon: Danie Binder, MD;  Location: [facility] SU[facility];  Service: Endoscopy;  Laterality: N/A;  930   . KNEE ARTHROCENTESIS  left  . TUBAL LIGATION  Social History:     Social History  Substance Use Topics  . Smoking status: Never Smoker  . Smokeless tobacco: Never Used     Comment: Never smoked  . Alcohol use No     Lives - at home  Mobility -      Family History :     Family History  Problem Relation Age of Onset  . Stroke Mother   . Heart attack Mother   . Cancer Sister   . Diabetes Sister   . Seizures Brother   . Diabetes Brother   . Heart disease Brother   . Colon cancer Neg Hx       Home Medications:   Prior to Admission medications   Medication Sig Start Date End Date Taking? Authorizing Provider  acetaminophen (TYLENOL) 500 MG tablet Take 500 mg by mouth every 6 (six) hours as needed for mild pain or headache.    Yes Historical Provider, MD  Biotin w/ Vitamins C & E (HAIR SKIN & NAILS GUMMIES PO) Take 1 each by mouth 2 (two) times daily.   Yes Historical Provider, MD  Cholecalciferol (VITAMIN D3 PO) Take 5,000 Units by mouth daily.    Yes Historical Provider, MD  dicyclomine (BENTYL) 20 MG tablet Take 1 tablet (20 mg total) by mouth 3 (three) times daily as needed for spasms. 12/29/15  Yes [name], MD  diphenoxylate-atropine (LOMOTIL) 2.5-0.025  MG tablet Take 1 tablet by mouth 4 (four) times daily as needed for diarrhea or loose stools. 12/29/15  Yes [name], MD  glipiZIDE (GLUCOTROL) 5 MG tablet Take 0.5 tablets (2.5 mg total) by mouth daily before breakfast. 04/23/16  Yes [name], MD  hydrochlorothiazide (HYDRODIURIL) 12.5 MG tablet Take 1 tablet (12.5 mg total) by mouth daily. 04/23/16  Yes [name], MD  linaclotide (LINZESS) 145 MCG CAPS capsule 1 PO 30 mins prior to your first meal Patient taking differently: Take 145 mcg by mouth daily before breakfast. 1 PO 30 mins prior to your first meal 01/22/16  Yes Danie Binder, MD  losartan (COZAAR) 50 MG tablet Take 1 tablet (50 mg total) by mouth daily. 07/02/15  Yes [name], MD  Multiple Vitamins-Minerals (MULTIVITAMIN WITH MINERALS) tablet Take 1 tablet by mouth daily.   Yes Historical Provider, MD  ondansetron (ZOFRAN ODT) 8 MG disintegrating tablet Take 1 tablet (8 mg total) by mouth every 8 (eight) hours as needed for nausea or vomiting. 12/29/15  Yes [name], MD  pravastatin (PRAVACHOL) 20 MG tablet Take 1 tablet (20 mg total) by mouth daily. 04/23/16  Yes [name], MD  glucose blood (TRUE METRIX BLOOD GLUCOSE TEST) test strip TEST BLOOD SUGAR ONE TIME DAILY 04/23/16   [name], MD  pantoprazole (PROTONIX) 40 MG tablet Take 1 tablet (40 mg total) by mouth daily. Patient not taking: Reported on 05/04/2016 01/02/16   Annitta Needs, NP  traZODone (DESYREL) 50 MG tablet Take one half to one tablet qhs Patient not taking: Reported on 05/04/2016 09/30/15   [name], MD     Allergies:     Allergies  Allergen Reactions  . Norvasc [Amlodipine Besylate]     Fatigue  . Penicillins Hives    Has patient had a PCN reaction causing immediate rash, facial/tongue/throat swelling, SOB or lightheadedness with hypotension: Yes Has patient had a PCN reaction causing severe rash involving mucus membranes or skin necrosis: No Has patient had a PCN reaction  that  required hospitalization No Has patient had a PCN reaction occurring within the last 10 years: No If all of the above answers are "NO", then may proceed with Cephalosporin use.   . Advair Diskus [Fluticasone-Salmeterol] Palpitations  . Metformin And Related     Causes stomach discomfort     Physical Exam:   Vitals  Blood pressure (!) 110/53, pulse 76, temperature 97.9 F (36.6 C), temperature source Oral, resp. rate 16, height 5\' 5"  (1.651 m), weight 99.8 kg (220 lb), SpO2 97 %.   1. General lying in bed in NAD,    2. Normal affect and insight, Not Suicidal or Homicidal, Awake Alert, Oriented X 3.  3. No F.N deficits, ALL C.Nerves Intact, Strength 5/5 all 4 extremities, Sensation intact all 4 extremities, Plantars down going.  4. Ears and Eyes appear Normal, Conjunctivae clear, PERRLA. Moist Oral Mucosa.  5. Supple Neck, No JVD, No cervical lymphadenopathy appriciated, No Carotid Bruits.  6. Symmetrical Chest wall movement, Good air movement bilaterally, CTAB.  7. RRR, No Gallops, Rubs or Murmurs, No Parasternal Heave.  8. Positive Bowel Sounds, Abdomen Soft, No tenderness, No organomegaly appriciated,No rebound -guarding or rigidity.  9.  No Cyanosis, Normal Skin Turgor, No Skin Rash or Bruise.  10. Good muscle tone,  joints appear normal , no effusions, Normal ROM.  11. No Palpable Lymph Nodes in Neck or Axillae     Data Review:    CBC  Recent Labs Lab 05/04/16 0736  WBC 13.4*  HGB 13.5  HCT 41.2  PLT 296  MCV 76.7*  MCH 25.1*  MCHC 32.8  RDW 16.7*   ------------------------------------------------------------------------------------------------------------------  Chemistries   Recent Labs Lab 05/04/16 0736  NA 137  K 3.7  CL 102  CO2 28  GLUCOSE 110*  BUN 15  CREATININE 0.90  CALCIUM 9.2  AST 19  ALT 18  ALKPHOS 77  BILITOT 1.0    ------------------------------------------------------------------------------------------------------------------ estimated creatinine clearance is 70 mL/min (by C-G formula based on SCr of 0.9 mg/dL). ------------------------------------------------------------------------------------------------------------------ No results for input(s): TSH, T4TOTAL, T3FREE, THYROIDAB in the last 72 hours.  Invalid input(s): FREET3  Coagulation profile No results for input(s): INR, PROTIME in the last 168 hours. ------------------------------------------------------------------------------------------------------------------- No results for input(s): DDIMER in the last 72 hours. -------------------------------------------------------------------------------------------------------------------  Cardiac Enzymes No results for input(s): CKMB, TROPONINI, MYOGLOBIN in the last 168 hours.  Invalid input(s): CK ------------------------------------------------------------------------------------------------------------------ No results found for: BNP   ---------------------------------------------------------------------------------------------------------------  Urinalysis    Component Value Date/Time   COLORURINE YELLOW 05/04/2016 0736   APPEARANCEUR HAZY (A) 05/04/2016 0736   LABSPEC <1.005 (L) 05/04/2016 0736   PHURINE 5.5 05/04/2016 0736   GLUCOSEU NEGATIVE 05/04/2016 0736   HGBUR NEGATIVE 05/04/2016 0736   BILIRUBINUR NEGATIVE 05/04/2016 0736   KETONESUR NEGATIVE 05/04/2016 0736   PROTEINUR NEGATIVE 05/04/2016 0736   UROBILINOGEN 2.0 (H) 11/15/2012 1425   NITRITE NEGATIVE 05/04/2016 0736   LEUKOCYTESUR NEGATIVE 05/04/2016 0736    ----------------------------------------------------------------------------------------------------------------   Imaging Results:    Ct Abdomen Pelvis W Contrast  Result Date: 05/04/2016 CLINICAL DATA:  70 year old female with left lower abdominal pain  for 3 days with nausea. History of diverticulitis. History of diabetes, hypertension and hepatic cysts. Initial encounter. EXAM: CT ABDOMEN AND PELVIS WITH CONTRAST TECHNIQUE: Multidetector CT imaging of the abdomen and pelvis was performed using the standard protocol following bolus administration of intravenous contrast. CONTRAST:  132mL ISOVUE-300 IOPAMIDOL (ISOVUE-300) INJECTION 61% COMPARISON:  12/28/2015, 11/15/2012 and 03/13/2010 CT. FINDINGS: Lower chest:  No acute findings. Hepatobiliary:  Post cholecystectomy. Fatty liver. Stable hepatic cysts, largest within the right lobe of the liver extending to the dome spanning over 14.5 x 10 x 10.5 cm. Pancreas: No mass, inflammatory changes, or other significant abnormality. Spleen: Within normal limits in size and appearance. Adrenals/Urinary Tract: No adrenal lesion. No hydronephrosis. Nonobstructing tiny left lower pole renal calculus. Left lower pole 6 mm low-density structure possibly a cyst too small to characterize and unchanged. Stomach/Bowel: Inflammation descending colon consistent with diverticulitis without drainable abscess. This segment of bowel with bowel wall thickening possibly related to inflammation. This can be assessed on follow-up to exclude underlying mass after inflammatory process has cleared. Scattered diverticular throughout remainder of colon. No other bowel inflammatory process or free intraperitoneal air is noted. Vascular/Lymphatic: Small number of lymph nodes surround the mid descending colon diverticulitis. No evidence of abdominal aortic aneurysm. Atherosclerotic changes aorta are and branch vessels with scattered calcification. No high-grade stenosis. Reproductive: Post hysterectomy. Other: None. Musculoskeletal: Degenerative changes lower thoracic lumbar spine with scoliosis. Bilateral hip joint degenerative changes. Stable appearance of nonspecific left iliac wing sclerotic process. Sacroiliac joint degenerative changes.  IMPRESSION: Mid descending colon diverticulitis as detailed above. Stable hepatic cysts largest right lobe measuring up to 14.5 cm. Fatty liver. Aortic atherosclerosis. Electronically Signed   By: Genia Del M.D.   On: 05/04/2016 09:48       Assessment & Plan:    Active Problems:   Diverticulitis    1. LLQ abdominal pain secondary to diverticulitis NPO Ns iv Cipro, Flagyl iv  2. Dm2 fsbs q4h, iss Hold amaryl  3. Hypertension Cont current bp medications.  Check cmp in am  4. Leukocytosis secondary to #1 Repeat cbc in am  5. Constipation Cont linzess  6. Gerd Cont protonix.   7. Hyperlipidemia Cont pravastatin  DVT Prophylaxis Lovenox - SCDs   AM Labs Ordered, also please review Full Orders  Family Communication: Admission, patients condition and plan of care including tests being ordered have been discussed with the patient  who indicate understanding and agree with the plan and Code Status.  Code Status FULL CODE  Likely DC to    Condition GUARDED    Consults called: none  Admission status: observation  Time spent in minutes : 45 minutes   Jani Gravel M.D on 05/04/2016 at 12:37 PM  Between 7am to 7pm - Pager - [phone]. After 7pm go to [email or web address] - password [facility]  [facility] - Office  [phone]

## 2016-05-04 NOTE — ED Notes (Signed)
Pt unable to give urine specimen at this time 

## 2016-05-05 ENCOUNTER — Observation Stay (HOSPITAL_COMMUNITY): Payer: Commercial Managed Care - HMO

## 2016-05-05 DIAGNOSIS — E119 Type 2 diabetes mellitus without complications: Secondary | ICD-10-CM | POA: Diagnosis not present

## 2016-05-05 DIAGNOSIS — I10 Essential (primary) hypertension: Secondary | ICD-10-CM | POA: Diagnosis not present

## 2016-05-05 DIAGNOSIS — K5792 Diverticulitis of intestine, part unspecified, without perforation or abscess without bleeding: Secondary | ICD-10-CM | POA: Diagnosis not present

## 2016-05-05 DIAGNOSIS — R0602 Shortness of breath: Secondary | ICD-10-CM | POA: Diagnosis not present

## 2016-05-05 LAB — COMPREHENSIVE METABOLIC PANEL
ALBUMIN: 3.2 g/dL — AB (ref 3.5–5.0)
ALK PHOS: 67 U/L (ref 38–126)
ALT: 44 U/L (ref 14–54)
ANION GAP: 8 (ref 5–15)
AST: 40 U/L (ref 15–41)
BILIRUBIN TOTAL: 0.9 mg/dL (ref 0.3–1.2)
BUN: 12 mg/dL (ref 6–20)
CALCIUM: 8.3 mg/dL — AB (ref 8.9–10.3)
CO2: 27 mmol/L (ref 22–32)
Chloride: 103 mmol/L (ref 101–111)
Creatinine, Ser: 0.83 mg/dL (ref 0.44–1.00)
GFR calc non Af Amer: >60 mL/min (ref >60)
Glucose, Bld: 79 mg/dL (ref 65–99)
POTASSIUM: 3.4 mmol/L — AB (ref 3.5–5.1)
SODIUM: 138 mmol/L (ref 135–145)
TOTAL PROTEIN: 6.2 g/dL — AB (ref 6.5–8.1)

## 2016-05-05 LAB — GLUCOSE, CAPILLARY
GLUCOSE-CAPILLARY: 88 mg/dL (ref 65–99)
GLUCOSE-CAPILLARY: 68 mg/dL (ref 65–99)
Glucose-Capillary: 129 mg/dL — ABNORMAL HIGH (ref 65–99)
Glucose-Capillary: 135 mg/dL — ABNORMAL HIGH (ref 65–99)
Glucose-Capillary: 141 mg/dL — ABNORMAL HIGH (ref 65–99)
Glucose-Capillary: 162 mg/dL — ABNORMAL HIGH (ref 65–99)
Glucose-Capillary: 89 mg/dL (ref 65–99)

## 2016-05-05 LAB — CBC
HEMATOCRIT: 35.6 % — AB (ref 36.0–46.0)
HEMOGLOBIN: 11.3 g/dL — AB (ref 12.0–15.0)
MCH: 24.9 pg — ABNORMAL LOW (ref 26.0–34.0)
MCHC: 31.7 g/dL (ref 30.0–36.0)
MCV: 78.4 fL (ref 78.0–100.0)
Platelets: 249 10*3/uL (ref 150–400)
RBC: 4.54 MIL/uL (ref 3.87–5.11)
RDW: 16.5 % — ABNORMAL HIGH (ref 11.5–15.5)
WBC: 5.8 10*3/uL (ref 4.0–10.5)

## 2016-05-05 LAB — HEMOGLOBIN A1C
HEMOGLOBIN A1C: 6.3 % — AB (ref 4.8–5.6)
Mean Plasma Glucose: 134 mg/dL

## 2016-05-05 MED ORDER — DEXTROSE 50 % IV SOLN
INTRAVENOUS | Status: AC
  Administered 2016-05-05: 50 mL
  Filled 2016-05-05: qty 50

## 2016-05-05 NOTE — Progress Notes (Addendum)
Patient ID: [name], female   DOB: 18-Jan-1946, 70 y.o.   MRN: [id number]                                                                PROGRESS NOTE                                                                                                                                                                                                             Patient Demographics:    [name], is a 70 y.o. female, DOB - 03-26-1946, [id number]  Admit date - 05/04/2016   Admitting Physician Jani Gravel, MD  Outpatient Primary MD for the patient is [name], MD  LOS - 0  Outpatient Specialists:  Chief Complaint  Patient presents with  . Abdominal Pain       Brief Narrative   70 y.o. female,  With hx of diverticulitis apparently c/o abdominal pain starting Sunday nite.  Left lower quadrant, sharp.  Slight nausea.  Denies fever, chills, emesis,  diarrhea, brbpr, black stool. Pt presented due to pain.   In ED. CT scan showed mild diverticulitis.  Pt will be admitted observation for pain control due to diveritculitis.     Subjective:    [name] today has, slight LLQ pain that has improved.  Afebrile   No headache, No chest pain,  - No Nausea, No new weakness tingling or numbness, No Cough - SOB.    Assessment  & Plan :    Active Problems:   Diverticulitis   1. 1. LLQ abdominal pain secondary to diverticulitis Advance diet as tolerated Ns iv Cipro, Flagyl iv  D#2 If better tomorrow can convert to po abx  2. Dm2 fsbs q4h, iss Hold amaryl  3. Hypertension Cont current bp medications.  Check cmp in am  4. Leukocytosis secondary to #1 Repeat cbc in am  5. Constipation Cont linzess  6. Gerd Cont protonix.   7. Hyperlipidemia Cont pravastatin  8. Hypoxia ? artifact ? [place], CXR r/o other pathology  DVT Prophylaxis Lovenox - SCDs   AM Labs Ordered, also please review Full Orders  Family Communication: Admission, patients  condition and plan of care including tests being ordered have been discussed with the patient  who indicate understanding and agree with the plan  and Code Status.  Code Status FULL CODE  Likely DC to    Condition GUARDED    Consults called: none  Admission status: observation  Time spent in minutes : 45 minutes  Lab Results  Component Value Date   PLT 249 05/05/2016    Antibiotics  :   Anti-infectives    Start     Dose/Rate Route Frequency Ordered Stop   05/04/16 2200  ciprofloxacin (CIPRO) IVPB 400 mg     400 mg 200 mL/hr over 60 Minutes Intravenous Every 12 hours 05/04/16 1559     05/04/16 1930  metroNIDAZOLE (FLAGYL) IVPB 500 mg     500 mg 100 mL/hr over 60 Minutes Intravenous Every 8 hours 05/04/16 1559     05/04/16 1145  ciprofloxacin (CIPRO) IVPB 400 mg     400 mg 200 mL/hr over 60 Minutes Intravenous  Once 05/04/16 1133 05/04/16 1354   05/04/16 1145  metroNIDAZOLE (FLAGYL) IVPB 500 mg     500 mg 100 mL/hr over 60 Minutes Intravenous  Once 05/04/16 1133 05/04/16 1253        Objective:   Vitals:   05/04/16 1509 05/04/16 1958 05/04/16 2105 05/05/16 0450  BP: (!) 94/39  (!) 128/36 (!) 125/42  Pulse: (!) 53  64 62  Resp: 18  18 18   Temp: 98 F (36.7 C)  98.4 F (36.9 C) 98 F (36.7 C)  TempSrc: Oral  Oral Oral  SpO2: 97% 97% (!) 84% (!) 88%  Weight:    110.9 kg (244 lb 9.6 oz)  Height:        Wt Readings from Last 3 Encounters:  05/05/16 110.9 kg (244 lb 9.6 oz)  04/23/16 110.5 kg (243 lb 8 oz)  03/22/16 112.1 kg (247 lb 3.2 oz)     Intake/Output Summary (Last 24 hours) at 05/05/16 0826 Last data filed at 05/05/16 0326  Gross per 24 hour  Intake              895 ml  Output                0 ml  Net              895 ml     Physical Exam  Awake Alert, Oriented X 3, No new F.N deficits, Normal affect [place].AT,PERRAL Supple Neck,No JVD, No cervical lymphadenopathy appriciated.  Symmetrical Chest wall movement, Good air movement bilaterally,  CTAB RRR,No Gallops,Rubs or new Murmurs, No Parasternal Heave +ve B.Sounds, Abd Soft, No tenderness, No organomegaly appriciated, No rebound - guarding or rigidity. No Cyanosis, Clubbing or edema, No new Rash or bruise      Data Review:    CBC  Recent Labs Lab 05/04/16 0736 05/05/16 0547  WBC 13.4* 5.8  HGB 13.5 11.3*  HCT 41.2 35.6*  PLT 296 249  MCV 76.7* 78.4  MCH 25.1* 24.9*  MCHC 32.8 31.7  RDW 16.7* 16.5*    Chemistries   Recent Labs Lab 05/04/16 0736 05/05/16 0547  NA 137 138  K 3.7 3.4*  CL 102 103  CO2 28 27  GLUCOSE 110* 79  BUN 15 12  CREATININE 0.90 0.83  CALCIUM 9.2 8.3*  AST 19 40  ALT 18 44  ALKPHOS 77 67  BILITOT 1.0 0.9   ------------------------------------------------------------------------------------------------------------------ No results for input(s): CHOL, HDL, LDLCALC, TRIG, CHOLHDL, LDLDIRECT in the last 72 hours.  Lab Results  Component Value Date   HGBA1C 6.3 (H) 05/04/2016   ------------------------------------------------------------------------------------------------------------------ No results for  input(s): TSH, T4TOTAL, T3FREE, THYROIDAB in the last 72 hours.  Invalid input(s): FREET3 ------------------------------------------------------------------------------------------------------------------ No results for input(s): VITAMINB12, FOLATE, FERRITIN, TIBC, IRON, RETICCTPCT in the last 72 hours.  Coagulation profile No results for input(s): INR, PROTIME in the last 168 hours.  No results for input(s): DDIMER in the last 72 hours.  Cardiac Enzymes No results for input(s): CKMB, TROPONINI, MYOGLOBIN in the last 168 hours.  Invalid input(s): CK ------------------------------------------------------------------------------------------------------------------ No results found for: BNP  Inpatient Medications  Scheduled Meds: . ciprofloxacin  400 mg Intravenous Q12H  . dextrose      . enoxaparin (LOVENOX)  injection  40 mg Subcutaneous Q24H  . hydrochlorothiazide  12.5 mg Oral Daily  . insulin aspart  0-9 Units Subcutaneous TID WC  . linaclotide  145 mcg Oral QAC breakfast  . losartan  50 mg Oral Daily  . metronidazole  500 mg Intravenous Q8H  . morphine  5 mg Intravenous Once  . pantoprazole  40 mg Oral Daily  . pravastatin  20 mg Oral Daily   Continuous Infusions: . sodium chloride 75 mL/hr at 05/04/16 1600   PRN Meds:.acetaminophen **OR** acetaminophen, dicyclomine, diphenoxylate-atropine, morphine injection, ondansetron, oxyCODONE  Micro Results No results found for this or any previous visit (from the past 240 hour(s)).  Radiology Reports Ct Abdomen Pelvis W Contrast  Result Date: 05/04/2016 CLINICAL DATA:  70 year old female with left lower abdominal pain for 3 days with nausea. History of diverticulitis. History of diabetes, hypertension and hepatic cysts. Initial encounter. EXAM: CT ABDOMEN AND PELVIS WITH CONTRAST TECHNIQUE: Multidetector CT imaging of the abdomen and pelvis was performed using the standard protocol following bolus administration of intravenous contrast. CONTRAST:  171mL ISOVUE-300 IOPAMIDOL (ISOVUE-300) INJECTION 61% COMPARISON:  12/28/2015, 11/15/2012 and 03/13/2010 CT. FINDINGS: Lower chest:  No acute findings. Hepatobiliary: Post cholecystectomy. Fatty liver. Stable hepatic cysts, largest within the right lobe of the liver extending to the dome spanning over 14.5 x 10 x 10.5 cm. Pancreas: No mass, inflammatory changes, or other significant abnormality. Spleen: Within normal limits in size and appearance. Adrenals/Urinary Tract: No adrenal lesion. No hydronephrosis. Nonobstructing tiny left lower pole renal calculus. Left lower pole 6 mm low-density structure possibly a cyst too small to characterize and unchanged. Stomach/Bowel: Inflammation descending colon consistent with diverticulitis without drainable abscess. This segment of bowel with bowel wall thickening  possibly related to inflammation. This can be assessed on follow-up to exclude underlying mass after inflammatory process has cleared. Scattered diverticular throughout remainder of colon. No other bowel inflammatory process or free intraperitoneal air is noted. Vascular/Lymphatic: Small number of lymph nodes surround the mid descending colon diverticulitis. No evidence of abdominal aortic aneurysm. Atherosclerotic changes aorta are and branch vessels with scattered calcification. No high-grade stenosis. Reproductive: Post hysterectomy. Other: None. Musculoskeletal: Degenerative changes lower thoracic lumbar spine with scoliosis. Bilateral hip joint degenerative changes. Stable appearance of nonspecific left iliac wing sclerotic process. Sacroiliac joint degenerative changes. IMPRESSION: Mid descending colon diverticulitis as detailed above. Stable hepatic cysts largest right lobe measuring up to 14.5 cm. Fatty liver. Aortic atherosclerosis. Electronically Signed   By: Genia Del M.D.   On: 05/04/2016 09:48    Time Spent in minutes  30   Jani Gravel M.D on 05/05/2016 at 8:26 AM  Between 7am to 7pm - Pager - [phone]  After 7pm go to [email or web address] - password [facility]  [facility] -  Office  [phone]

## 2016-05-05 NOTE — Care Management Note (Signed)
Case Management Note  Patient Details  Name: [name] MRN: [id number] Date of Birth: May 29, 1946  Subjective/Objective: Patient adm from home with diverticulitis. Ind with ADL's. No CM needs.                    Action/Plan: Anticipate DC home with self care.  Expected Discharge Date:                  Expected Discharge Plan:  Home/Self Care  In-House Referral:  NA  Discharge planning Services  CM Consult  Post Acute Care Choice:  NA Choice offered to:  NA  DME Arranged:    DME Agency:     HH Arranged:    HH Agency:     Status of Service:  Completed, signed off  If discussed at [employer] of Stay Meetings, dates discussed:    Additional Comments:  [name], Kathleen Reading, RN 05/05/2016, 2:54 PM

## 2016-05-05 NOTE — Progress Notes (Signed)
CRITICAL VALUE ALERT  Critical value received:  CBG 68  Date of notification:  05/05/2016  Time of notification:  0800 AM Critical value read back:Yes.    Nurse who received alert:  Radene Gunning  MD notified (1st page):  Dr Maudie Mercury  Time of first page:  0805 am  MD notified (2nd page):  Time of second page:  Responding MD:  Awaiting response  Time MD responded:  Awaiting response

## 2016-05-05 NOTE — Care Management Obs Status (Signed)
[place] NOTIFICATION   Patient Details  Name: [name] MRN: [id number] Date of Birth: March 18, 1946   Medicare Observation Status Notification Given:  Yes    Lukisha Procida, Chauncey Reading, RN 05/05/2016, 2:54 PM

## 2016-05-06 DIAGNOSIS — E119 Type 2 diabetes mellitus without complications: Secondary | ICD-10-CM | POA: Diagnosis not present

## 2016-05-06 DIAGNOSIS — I10 Essential (primary) hypertension: Secondary | ICD-10-CM | POA: Diagnosis not present

## 2016-05-06 DIAGNOSIS — K5792 Diverticulitis of intestine, part unspecified, without perforation or abscess without bleeding: Secondary | ICD-10-CM | POA: Diagnosis not present

## 2016-05-06 LAB — GLUCOSE, CAPILLARY
GLUCOSE-CAPILLARY: 94 mg/dL (ref 65–99)
Glucose-Capillary: 100 mg/dL — ABNORMAL HIGH (ref 65–99)
Glucose-Capillary: 108 mg/dL — ABNORMAL HIGH (ref 65–99)
Glucose-Capillary: 83 mg/dL (ref 65–99)

## 2016-05-06 LAB — CBC
HEMATOCRIT: 34.7 % — AB (ref 36.0–46.0)
HEMOGLOBIN: 11 g/dL — AB (ref 12.0–15.0)
MCH: 24.8 pg — ABNORMAL LOW (ref 26.0–34.0)
MCHC: 31.7 g/dL (ref 30.0–36.0)
MCV: 78.2 fL (ref 78.0–100.0)
Platelets: 255 10*3/uL (ref 150–400)
RBC: 4.44 MIL/uL (ref 3.87–5.11)
RDW: 16.3 % — ABNORMAL HIGH (ref 11.5–15.5)
WBC: 5.4 10*3/uL (ref 4.0–10.5)

## 2016-05-06 LAB — COMPREHENSIVE METABOLIC PANEL
ALBUMIN: 3.1 g/dL — AB (ref 3.5–5.0)
ALK PHOS: 60 U/L (ref 38–126)
ALT: 35 U/L (ref 14–54)
AST: 25 U/L (ref 15–41)
Anion gap: 5 (ref 5–15)
BILIRUBIN TOTAL: 0.4 mg/dL (ref 0.3–1.2)
BUN: 14 mg/dL (ref 6–20)
CALCIUM: 8.5 mg/dL — AB (ref 8.9–10.3)
CO2: 29 mmol/L (ref 22–32)
Chloride: 106 mmol/L (ref 101–111)
Creatinine, Ser: 0.79 mg/dL (ref 0.44–1.00)
GFR calc Af Amer: >60 mL/min (ref >60)
GFR calc non Af Amer: >60 mL/min (ref >60)
GLUCOSE: 92 mg/dL (ref 65–99)
Potassium: 3.8 mmol/L (ref 3.5–5.1)
SODIUM: 140 mmol/L (ref 135–145)
TOTAL PROTEIN: 6.1 g/dL — AB (ref 6.5–8.1)

## 2016-05-06 MED ORDER — CIPROFLOXACIN HCL 250 MG PO TABS
500.0000 mg | ORAL_TABLET | Freq: Two times a day (BID) | ORAL | Status: DC
  Administered 2016-05-06: 500 mg via ORAL
  Filled 2016-05-06: qty 2

## 2016-05-06 MED ORDER — CIPROFLOXACIN HCL 500 MG PO TABS: 500.0000 mg | ORAL_TABLET | Freq: Two times a day (BID) | ORAL | 0 refills | Status: DC

## 2016-05-06 MED ORDER — METRONIDAZOLE 500 MG PO TABS: 500.0000 mg | ORAL_TABLET | Freq: Three times a day (TID) | ORAL | 0 refills | Status: DC

## 2016-05-06 MED ORDER — METRONIDAZOLE 500 MG PO TABS
500.0000 mg | ORAL_TABLET | Freq: Three times a day (TID) | ORAL | Status: DC
  Administered 2016-05-06 (×2): 500 mg via ORAL
  Filled 2016-05-06 (×2): qty 1

## 2016-05-06 NOTE — Consult Note (Signed)
   [facility] CM Inpatient Consult   05/06/2016  [name] 1945-12-20 [id number]  Spoke with patient and spouse at bedside regarding [facility] services. Patient does not want to participate with [facility] at this time. Patient given [facility] brochure and contact information for future reference, voices appreciation of information.    Inpatient case manager aware that patient offered [facility] case management services but declined.   Of note, [facility] Care Management services would not replace or interfere with any services that are arranged by inpatient case management or social work. For additional questions or referrals please contact:   Royetta Crochet. Laymond Purser, RN, BSN, [facility] Liaison [phone]

## 2016-05-06 NOTE — Progress Notes (Signed)
Patient ID: [name], female   DOB: 1945-11-22, 70 y.o.   MRN: [id number]                                                                PROGRESS NOTE                                                                                                                                                                                                             Patient Demographics:    [name], is a 70 y.o. female, DOB - 30-Apr-1946, [id number]  Admit date - 05/04/2016   Admitting Physician Jani Gravel, MD  Outpatient Primary MD for the patient is [name], MD  LOS - 0  Outpatient Specialists:   Chief Complaint  Patient presents with  . Abdominal Pain       Brief Narrative  70 y.o.female,With hx of diverticulitis apparently c/o abdominal pain starting Sunday nite. Left lower quadrant, sharp. Slight nausea. Denies fever, chills, emesis, diarrhea, brbpr, black stool. Pt presented due to pain.   In ED. CT scan showed mild diverticulitis. Pt will be admitted observation for pain control due to diveritculitis.    Subjective:    [name] today has been tolerating regular diet and her abdominal pain has subsided.  Pt feeling stable and if tolerating po abx, would like to go home.    , No headache, No chest pain, No abdominal pain - No Nausea, No new weakness tingling or numbness, No Cough - SOB.    Assessment  & Plan :    Active Problems:   Diverticulitis   1. 1. LLQ abdominal pain secondary to diverticulitis Advance diet as tolerated Ns iv Cipro, Flagyl iv  D#3=> convert to po  2. Dm2 fsbs q4h, iss Hold amaryl  3. Hypertension Cont current bp medications.  Check cmp in am  4. Leukocytosis secondary to #1 Repeat cbc in am  5. Constipation Cont linzess  6. Gerd Cont protonix.   7. Hyperlipidemia Cont pravastatin  8. Hypoxia ? artifact ? Osa, CXR r/o other pathology  DVT ProphylaxisLovenox - SCDs   AM Labs  Ordered, also please review Full Orders  Family Communication:Admission, patients condition and plan of care including tests being ordered have been discussed with the patient who indicate understanding and agree  with the plan and Code Status.  Code Status FULL CODE  Likely DC to   Condition GUARDED  Consults called:none  Admission status:observation  Time spent in minutes: 45 minutes  Lab Results  Component Value Date   PLT 255 05/06/2016    Antibiotics  :    Anti-infectives    Start     Dose/Rate Route Frequency Ordered Stop   05/04/16 2200  ciprofloxacin (CIPRO) IVPB 400 mg     400 mg 200 mL/hr over 60 Minutes Intravenous Every 12 hours 05/04/16 1559     05/04/16 1930  metroNIDAZOLE (FLAGYL) IVPB 500 mg     500 mg 100 mL/hr over 60 Minutes Intravenous Every 8 hours 05/04/16 1559     05/04/16 1145  ciprofloxacin (CIPRO) IVPB 400 mg     400 mg 200 mL/hr over 60 Minutes Intravenous  Once 05/04/16 1133 05/04/16 1354   05/04/16 1145  metroNIDAZOLE (FLAGYL) IVPB 500 mg     500 mg 100 mL/hr over 60 Minutes Intravenous  Once 05/04/16 1133 05/04/16 1253        Objective:   Vitals:   05/05/16 1451 05/05/16 2015 05/05/16 2100 05/06/16 0438  BP: (!) 113/44  (!) 119/45 (!) 110/42  Pulse: 60  63 (!) 59  Resp: 20  18 18   Temp: 98.3 F (36.8 C)  98.2 F (36.8 C) 97.9 F (36.6 C)  TempSrc: Oral  Oral Oral  SpO2: 96% 98% 97% 97%  Weight:    112.4 kg (247 lb 11.2 oz)  Height:        Wt Readings from Last 3 Encounters:  05/06/16 112.4 kg (247 lb 11.2 oz)  04/23/16 110.5 kg (243 lb 8 oz)  03/22/16 112.1 kg (247 lb 3.2 oz)    No intake or output data in the 24 hours ending 05/06/16 0748   Physical Exam  Awake Alert, Oriented X 3, No new F.N deficits, Normal affect [place].AT,PERRAL Supple Neck,No JVD, No cervical lymphadenopathy appriciated.  Symmetrical Chest wall movement, Good air movement bilaterally, CTAB RRR,No Gallops,Rubs or new Murmurs, No  Parasternal Heave +ve B.Sounds, Abd Soft, No tenderness, No organomegaly appriciated, No rebound - guarding or rigidity. No Cyanosis, Clubbing or edema, No new Rash or bruise      Data Review:    CBC  Recent Labs Lab 05/04/16 0736 05/05/16 0547 05/06/16 0548  WBC 13.4* 5.8 5.4  HGB 13.5 11.3* 11.0*  HCT 41.2 35.6* 34.7*  PLT 296 249 255  MCV 76.7* 78.4 78.2  MCH 25.1* 24.9* 24.8*  MCHC 32.8 31.7 31.7  RDW 16.7* 16.5* 16.3*    Chemistries   Recent Labs Lab 05/04/16 0736 05/05/16 0547 05/06/16 0548  NA 137 138 140  K 3.7 3.4* 3.8  CL 102 103 106  CO2 28 27 29   GLUCOSE 110* 79 92  BUN 15 12 14   CREATININE 0.90 0.83 0.79  CALCIUM 9.2 8.3* 8.5*  AST 19 40 25  ALT 18 44 35  ALKPHOS 77 67 60  BILITOT 1.0 0.9 0.4   ------------------------------------------------------------------------------------------------------------------ No results for input(s): CHOL, HDL, LDLCALC, TRIG, CHOLHDL, LDLDIRECT in the last 72 hours.  Lab Results  Component Value Date   HGBA1C 6.3 (H) 05/04/2016   ------------------------------------------------------------------------------------------------------------------ No results for input(s): TSH, T4TOTAL, T3FREE, THYROIDAB in the last 72 hours.  Invalid input(s): FREET3 ------------------------------------------------------------------------------------------------------------------ No results for input(s): VITAMINB12, FOLATE, FERRITIN, TIBC, IRON, RETICCTPCT in the last 72 hours.  Coagulation profile No results for input(s): INR, PROTIME in the last 168  hours.  No results for input(s): DDIMER in the last 72 hours.  Cardiac Enzymes No results for input(s): CKMB, TROPONINI, MYOGLOBIN in the last 168 hours.  Invalid input(s): CK ------------------------------------------------------------------------------------------------------------------ No results found for: BNP  Inpatient Medications  Scheduled Meds: . ciprofloxacin   400 mg Intravenous Q12H  . enoxaparin (LOVENOX) injection  40 mg Subcutaneous Q24H  . hydrochlorothiazide  12.5 mg Oral Daily  . insulin aspart  0-9 Units Subcutaneous TID WC  . linaclotide  145 mcg Oral QAC breakfast  . losartan  50 mg Oral Daily  . metronidazole  500 mg Intravenous Q8H  . morphine  5 mg Intravenous Once  . pantoprazole  40 mg Oral Daily  . pravastatin  20 mg Oral Daily   Continuous Infusions:  PRN Meds:.acetaminophen **OR** acetaminophen, dicyclomine, diphenoxylate-atropine, morphine injection, ondansetron, oxyCODONE  Micro Results No results found for this or any previous visit (from the past 240 hour(s)).  Radiology Reports Dg Chest 2 View  Result Date: 05/05/2016 CLINICAL DATA:  Shortness of breath.  Hypoxia. EXAM: CHEST  2 VIEW COMPARISON:  None. FINDINGS: The heart size and mediastinal contours are within normal limits. Both lungs are clear. The visualized skeletal structures are unremarkable. IMPRESSION: No active cardiopulmonary disease. Electronically Signed   By: Kathreen Devoid   On: 05/05/2016 11:33   Ct Abdomen Pelvis W Contrast  Result Date: 05/04/2016 CLINICAL DATA:  70 year old female with left lower abdominal pain for 3 days with nausea. History of diverticulitis. History of diabetes, hypertension and hepatic cysts. Initial encounter. EXAM: CT ABDOMEN AND PELVIS WITH CONTRAST TECHNIQUE: Multidetector CT imaging of the abdomen and pelvis was performed using the standard protocol following bolus administration of intravenous contrast. CONTRAST:  149mL ISOVUE-300 IOPAMIDOL (ISOVUE-300) INJECTION 61% COMPARISON:  12/28/2015, 11/15/2012 and 03/13/2010 CT. FINDINGS: Lower chest:  No acute findings. Hepatobiliary: Post cholecystectomy. Fatty liver. Stable hepatic cysts, largest within the right lobe of the liver extending to the dome spanning over 14.5 x 10 x 10.5 cm. Pancreas: No mass, inflammatory changes, or other significant abnormality. Spleen: Within normal limits  in size and appearance. Adrenals/Urinary Tract: No adrenal lesion. No hydronephrosis. Nonobstructing tiny left lower pole renal calculus. Left lower pole 6 mm low-density structure possibly a cyst too small to characterize and unchanged. Stomach/Bowel: Inflammation descending colon consistent with diverticulitis without drainable abscess. This segment of bowel with bowel wall thickening possibly related to inflammation. This can be assessed on follow-up to exclude underlying mass after inflammatory process has cleared. Scattered diverticular throughout remainder of colon. No other bowel inflammatory process or free intraperitoneal air is noted. Vascular/Lymphatic: Small number of lymph nodes surround the mid descending colon diverticulitis. No evidence of abdominal aortic aneurysm. Atherosclerotic changes aorta are and branch vessels with scattered calcification. No high-grade stenosis. Reproductive: Post hysterectomy. Other: None. Musculoskeletal: Degenerative changes lower thoracic lumbar spine with scoliosis. Bilateral hip joint degenerative changes. Stable appearance of nonspecific left iliac wing sclerotic process. Sacroiliac joint degenerative changes. IMPRESSION: Mid descending colon diverticulitis as detailed above. Stable hepatic cysts largest right lobe measuring up to 14.5 cm. Fatty liver. Aortic atherosclerosis. Electronically Signed   By: Genia Del M.D.   On: 05/04/2016 09:48    Time Spent in minutes  30   Jani Gravel M.D on 05/06/2016 at 7:48 AM  Between 7am to 7pm - Pager - [phone]  After 7pm go to [email or web address] - password [facility]  [facility] -  Office  [phone]

## 2016-05-06 NOTE — Discharge Summary (Signed)
[name], is a 70 y.o. female  DOB 10-28-1945  MRN [id number].  Admission date:  05/04/2016  Admitting Physician  Jani Gravel, MD  Discharge Date:  05/06/2016   Primary MD  [name], MD  Recommendations for primary care physician for things to follow:   Diverticulitis cipro 500mg  po bid x 7 days Flagyl 500mg  po tid x 7 days F/u with primary care provider in 1 week.   Hypertension Continue regular home medication  Dm2 Continue regular home medication   Admission Diagnosis  Diverticulitis of large intestine without perforation or abscess without bleeding [K57.32]   Discharge Diagnosis  Diverticulitis of large intestine without perforation or abscess without bleeding [K57.32]    Active Problems:   Essential hypertension   Diabetes mellitus type 2, diet-controlled ([place])   Diverticulitis      Past Medical History:  Diagnosis Date  . Blood clot of artery under arm ([place])   . Cyst on liver  . Depression   . Diabetes mellitus    controlled by diet  . Diverticulitis   . DVT of axillary vein, acute right 11/24/2012   Dx on Jan 28, 2012.    . Fibrocystic breast disease   . Hepatic cyst   . Hypertension   . IBS (irritable bowel syndrome)   . Impaired glucose tolerance   . Menopause     Past Surgical History:  Procedure Laterality Date  . ABDOMINAL HYSTERECTOMY    . BACK SURGERY    . BREAST SURGERY  right   cyst removed  . CAST APPLICATION  [id number]   Procedure: CAST APPLICATION;  Surgeon: Carole Civil, MD;  Location: [facility] ORS;  Service: Orthopedics;  Laterality: Right;  procedure room   . CHOLECYSTECTOMY    . COLONOSCOPY  12/04/2004   NUR:Few small diverticula at sigmoid colon/Small cecal polyp ablated   . COLONOSCOPY N/A 09/24/2014   SLF: small internal hemorrhoids  . ESOPHAGOGASTRODUODENOSCOPY N/A 02/02/2016   Procedure: ESOPHAGOGASTRODUODENOSCOPY (EGD);  Surgeon:  Danie Binder, MD;  Location: [facility] END[facility] SUITE;  Service: Endoscopy;  Laterality: N/A;  930   . KNEE ARTHROCENTESIS  left  . TUBAL LIGATION         HPI  from the history and physical done on the day of admission:      70 y.o. female,  With hx of diverticulitis apparently c/o abdominal pain starting Sunday nite.  Left lower quadrant, sharp.  Slight nausea.  Denies fever, chills, emesis,  diarrhea, brbpr, black stool. Pt presented due to pain.   In ED. CT scan showed mild diverticulitis.  Pt will be admitted observation for pain control due to diveritculitis.     Hospital Course:     Pt was started on iv cipro and flagyl.  LLQ pain improved and has resolved.  Slight gasiness.  Denies fever, chills, diarrhea, brbpr, black stool.  Pt is tolerating food and appears stable and will be discharged to home today.    Follow UP  Follow-up Information  [name], MD Follow up in 1 week(s).   Specialty:  Family Medicine Contact information: [address] [name] [address] [phone]            Consults obtained - none  Discharge Condition: stable  Diet and Activity recommendation: See Discharge Instructions below  Discharge Instructions        Discharge Medications       Medication List    STOP taking these medications   traZODone 50 MG tablet Commonly known as:  DESYREL     TAKE these medications   acetaminophen 500 MG tablet Commonly known as:  TYLENOL Take 500 mg by mouth every 6 (six) hours as needed for mild pain or headache.   ciprofloxacin 500 MG tablet Commonly known as:  CIPRO Take 1 tablet (500 mg total) by mouth 2 (two) times daily.   dicyclomine 20 MG tablet Commonly known as:  BENTYL Take 1 tablet (20 mg total) by mouth 3 (three) times daily as needed for spasms.   diphenoxylate-atropine 2.5-0.025 MG tablet Commonly known as:  LOMOTIL Take 1 tablet by mouth 4 (four) times daily as needed for diarrhea or loose stools.     glipiZIDE 5 MG tablet Commonly known as:  GLUCOTROL Take 0.5 tablets (2.5 mg total) by mouth daily before breakfast.   glucose blood test strip Commonly known as:  TRUE METRIX BLOOD GLUCOSE TEST TEST BLOOD SUGAR ONE TIME DAILY   HAIR SKIN & NAILS GUMMIES PO Take 1 each by mouth 2 (two) times daily.   hydrochlorothiazide 12.5 MG tablet Commonly known as:  HYDRODIURIL Take 1 tablet (12.5 mg total) by mouth daily.   linaclotide 145 MCG Caps capsule Commonly known as:  LINZESS 1 PO 30 mins prior to your first meal What changed:  how much to take  how to take this  when to take this  additional instructions   losartan 50 MG tablet Commonly known as:  COZAAR Take 1 tablet (50 mg total) by mouth daily.   metroNIDAZOLE 500 MG tablet Commonly known as:  FLAGYL Take 1 tablet (500 mg total) by mouth every 8 (eight) hours.   multivitamin with minerals tablet Take 1 tablet by mouth daily.   ondansetron 8 MG disintegrating tablet Commonly known as:  ZOFRAN ODT Take 1 tablet (8 mg total) by mouth every 8 (eight) hours as needed for nausea or vomiting.   pantoprazole 40 MG tablet Commonly known as:  PROTONIX Take 1 tablet (40 mg total) by mouth daily.   pravastatin 20 MG tablet Commonly known as:  PRAVACHOL Take 1 tablet (20 mg total) by mouth daily.   VITAMIN D3 PO Take 5,000 Units by mouth daily.       Major procedures and Radiology Reports - PLEASE review detailed and final reports for all details, in brief -     Dg Chest 2 View  Result Date: 05/05/2016 CLINICAL DATA:  Shortness of breath.  Hypoxia. EXAM: CHEST  2 VIEW COMPARISON:  None. FINDINGS: The heart size and mediastinal contours are within normal limits. Both lungs are clear. The visualized skeletal structures are unremarkable. IMPRESSION: No active cardiopulmonary disease. Electronically Signed   By: Kathreen Devoid   On: 05/05/2016 11:33   Ct Abdomen Pelvis W Contrast  Result Date: 05/04/2016 CLINICAL  DATA:  70 year old female with left lower abdominal pain for 3 days with nausea. History of diverticulitis. History of diabetes, hypertension and hepatic cysts. Initial encounter. EXAM: CT ABDOMEN AND PELVIS WITH CONTRAST TECHNIQUE:  Multidetector CT imaging of the abdomen and pelvis was performed using the standard protocol following bolus administration of intravenous contrast. CONTRAST:  195mL ISOVUE-300 IOPAMIDOL (ISOVUE-300) INJECTION 61% COMPARISON:  12/28/2015, 11/15/2012 and 03/13/2010 CT. FINDINGS: Lower chest:  No acute findings. Hepatobiliary: Post cholecystectomy. Fatty liver. Stable hepatic cysts, largest within the right lobe of the liver extending to the dome spanning over 14.5 x 10 x 10.5 cm. Pancreas: No mass, inflammatory changes, or other significant abnormality. Spleen: Within normal limits in size and appearance. Adrenals/Urinary Tract: No adrenal lesion. No hydronephrosis. Nonobstructing tiny left lower pole renal calculus. Left lower pole 6 mm low-density structure possibly a cyst too small to characterize and unchanged. Stomach/Bowel: Inflammation descending colon consistent with diverticulitis without drainable abscess. This segment of bowel with bowel wall thickening possibly related to inflammation. This can be assessed on follow-up to exclude underlying mass after inflammatory process has cleared. Scattered diverticular throughout remainder of colon. No other bowel inflammatory process or free intraperitoneal air is noted. Vascular/Lymphatic: Small number of lymph nodes surround the mid descending colon diverticulitis. No evidence of abdominal aortic aneurysm. Atherosclerotic changes aorta are and branch vessels with scattered calcification. No high-grade stenosis. Reproductive: Post hysterectomy. Other: None. Musculoskeletal: Degenerative changes lower thoracic lumbar spine with scoliosis. Bilateral hip joint degenerative changes. Stable appearance of nonspecific left iliac wing sclerotic  process. Sacroiliac joint degenerative changes. IMPRESSION: Mid descending colon diverticulitis as detailed above. Stable hepatic cysts largest right lobe measuring up to 14.5 cm. Fatty liver. Aortic atherosclerosis. Electronically Signed   By: Genia Del M.D.   On: 05/04/2016 09:48    Micro Results   No results found for this or any previous visit (from the past 240 hour(s)).     Today   Subjective    Katara Detzel today has no abdominal pain,  Eating ok.     No headache,no chest abdominal pain,no new weakness tingling or numbness, feels much better wants to go home today.    Objective   Blood pressure (!) 110/42, pulse (!) 59, temperature 97.9 F (36.6 C), temperature source Oral, resp. rate 18, height 5\' 5"  (1.651 m), weight 112.4 kg (247 lb 11.2 oz), SpO2 97 %.   Intake/Output Summary (Last 24 hours) at 05/06/16 1224 Last data filed at 05/06/16 1100  Gross per 24 hour  Intake              240 ml  Output                0 ml  Net              240 ml    Exam Awake Alert, Oriented x 3, No new F.N deficits, Normal affect [place].AT,PERRAL Supple Neck,No JVD, No cervical lymphadenopathy appriciated.  Symmetrical Chest wall movement, Good air movement bilaterally, CTAB RRR,No Gallops,Rubs or new Murmurs, No Parasternal Heave +ve B.Sounds, Abd Soft, Non tender, No organomegaly appriciated, No rebound -guarding or rigidity. No Cyanosis, Clubbing or edema, No new Rash or bruise   Data Review   CBC w Diff: Lab Results  Component Value Date   WBC 5.4 05/06/2016   HGB 11.0 (L) 05/06/2016   HCT 34.7 (L) 05/06/2016   HCT 41.0 09/30/2015   PLT 255 05/06/2016   PLT 316 09/30/2015   LYMPHOPCT 43 12/28/2015   MONOPCT 9 12/28/2015   EOSPCT 3 12/28/2015   BASOPCT 1 12/28/2015    CMP: Lab Results  Component Value Date   NA 140 05/06/2016   NA 141  09/30/2015   K 3.8 05/06/2016   CL 106 05/06/2016   CO2 29 05/06/2016   BUN 14 05/06/2016   BUN 16 09/30/2015    CREATININE 0.79 05/06/2016   CREATININE 0.79 06/19/2014   PROT 6.1 (L) 05/06/2016   PROT 7.3 03/26/2015   ALBUMIN 3.1 (L) 05/06/2016   ALBUMIN 4.1 03/26/2015   BILITOT 0.4 05/06/2016   BILITOT 0.4 03/26/2015   ALKPHOS 60 05/06/2016   AST 25 05/06/2016   ALT 35 05/06/2016  .   Total Time in preparing paper work, data evaluation and todays exam - 18 minutes  Jani Gravel M.D on 05/06/2016 at 12:24 PM  [facility]   Office  [phone]

## 2016-05-06 NOTE — Progress Notes (Signed)
Patient with new orders to be discharge home. Discharge instructions given, patient verbalized understanding. Patient stable. Patient left in private vehicle with spouse.

## 2016-05-10 ENCOUNTER — Ambulatory Visit (INDEPENDENT_AMBULATORY_CARE_PROVIDER_SITE_OTHER): Payer: Commercial Managed Care - HMO | Admitting: Family Medicine

## 2016-05-10 ENCOUNTER — Encounter: Payer: Self-pay | Admitting: Family Medicine

## 2016-05-10 VITALS — BP 148/82 | Temp 98.0°F | Ht 65.0 in | Wt 244.0 lb

## 2016-05-10 DIAGNOSIS — K5792 Diverticulitis of intestine, part unspecified, without perforation or abscess without bleeding: Secondary | ICD-10-CM | POA: Diagnosis not present

## 2016-05-10 NOTE — Progress Notes (Signed)
   Subjective:    Patient ID: [name], female    DOB: December 18, 1945, 70 y.o.   MRN: [id number]  HPI    Review of Systems     Objective:   Physical Exam        Assessment & Plan:

## 2016-05-10 NOTE — Progress Notes (Signed)
   Subjective:    Patient ID: [name], female    DOB: 21-Nov-1945, 70 y.o.   MRN: [id number]  HPIFollow up on diverticulitis. Hospitalized on 9/5. Having trouble eating, nausea, and weakness.   Pt states she takes cipro and flagyl together. Every time she takes she vomits.     Review of Systems  Constitutional: Positive for fatigue. Negative for fever.  Respiratory: Negative for cough and shortness of breath.   Cardiovascular: Negative for chest pain.  Gastrointestinal: Positive for abdominal pain.  Endocrine: Negative for heat intolerance.  Musculoskeletal: Positive for arthralgias.       Objective:   Physical Exam  Constitutional: She appears well-developed.  HENT:  Head: Normocephalic.  Cardiovascular: Normal rate, regular rhythm and normal heart sounds.   No murmur heard. Pulmonary/Chest: Effort normal and breath sounds normal.  Abdominal: Soft. She exhibits no distension. There is tenderness (mild abdominal tenderness along the left lateral side no guarding or rebound).  Musculoskeletal: She exhibits no edema.  Neurological: She is alert.  Skin: Skin is warm and dry.          Assessment & Plan:  Diverticulitis-improvement is noted but patient still with some tenderness along the lateral aspect she is to take it easy this week. Gradually get back into regular eating. Hold glipizide until her appetite is getting better. Continue antibiotics. Follow-up in 2-3 weeks for recheck. Will be seen gastroenterology in October. Follow-up sooner fever vomiting or severe pain

## 2016-05-27 ENCOUNTER — Encounter: Payer: Self-pay | Admitting: Family Medicine

## 2016-05-27 ENCOUNTER — Ambulatory Visit (INDEPENDENT_AMBULATORY_CARE_PROVIDER_SITE_OTHER): Payer: Commercial Managed Care - HMO | Admitting: Family Medicine

## 2016-05-27 VITALS — BP 144/78 | Ht 65.0 in | Wt 241.1 lb

## 2016-05-27 DIAGNOSIS — S39012A Strain of muscle, fascia and tendon of lower back, initial encounter: Secondary | ICD-10-CM

## 2016-05-27 DIAGNOSIS — R1032 Left lower quadrant pain: Secondary | ICD-10-CM

## 2016-05-27 DIAGNOSIS — Z23 Encounter for immunization: Secondary | ICD-10-CM

## 2016-05-27 MED ORDER — TRAMADOL HCL 50 MG PO TABS: 50.0000 mg | ORAL_TABLET | Freq: Three times a day (TID) | ORAL | 0 refills | Status: DC | PRN

## 2016-05-27 MED ORDER — DICYCLOMINE HCL 20 MG PO TABS: 20.0000 mg | ORAL_TABLET | Freq: Three times a day (TID) | ORAL | 3 refills | Status: DC | PRN

## 2016-05-27 NOTE — Progress Notes (Signed)
   Subjective:    Patient ID: [name], female    DOB: [birth year], 70 y.o.   MRN: [id number]  HPI  Patient in today for a recheck of diverticulitis. Patient has c/o nausea, and abdominal pain. Patient has finished up the antibiotic she is not running any fever she does have some intermittent vomiting and not tolerating feeds quite as much is normal she denies sweats chills. Patient also has c/o of back pain. This occurred after lifting some garbage. It is mid back. Does not radiate down the legs. Breathing fine.  Review of Systems See above. Occasional loose stool. No bloody stools. Abdominal discomfort better than what it was when she was in the hospital.    Objective:   Physical Exam Lungs are clear no crackles heart regular pulse normal abdomen soft with some lower abdominal tenderness on the left side no guarding or rebound extremities no edema  Mouth tenderness mid back     Assessment & Plan:  Abdominal discomfort-this is probably related into the recent diverticulitis but I do not feel the patient needs CT scan. We will do a CBC to check white blood count await the results of this. I do not feel she needs further antibiotics currently. Bland diet. Follow-up with gastroenterology next week. If high fevers or progressively worse follow-up here or ER sooner  Back strain-should gradually get better massage warm compresses Tylenol during the day tramadol in the evening caution drowsiness if progressive troubles follow-up

## 2016-05-28 LAB — BASIC METABOLIC PANEL
BUN/Creatinine Ratio: 22 (ref 12–28)
BUN: 18 mg/dL (ref 8–27)
CHLORIDE: 99 mmol/L (ref 96–106)
CO2: 27 mmol/L (ref 18–29)
Calcium: 10 mg/dL (ref 8.7–10.3)
Creatinine, Ser: 0.83 mg/dL (ref 0.57–1.00)
GFR calc Af Amer: 84 mL/min/{1.73_m2} (ref >59)
GFR, EST NON AFRICAN AMERICAN: 73 mL/min/{1.73_m2} (ref >59)
Glucose: 105 mg/dL — ABNORMAL HIGH (ref 65–99)
POTASSIUM: 4.7 mmol/L (ref 3.5–5.2)
SODIUM: 141 mmol/L (ref 134–144)

## 2016-05-28 LAB — CBC WITH DIFFERENTIAL/PLATELET
BASOS: 1 %
Basophils Absolute: 0 10*3/uL (ref 0.0–0.2)
EOS (ABSOLUTE): 0.1 10*3/uL (ref 0.0–0.4)
Eos: 3 %
HEMOGLOBIN: 13.4 g/dL (ref 11.1–15.9)
Hematocrit: 42.2 % (ref 34.0–46.6)
IMMATURE GRANS (ABS): 0 10*3/uL (ref 0.0–0.1)
IMMATURE GRANULOCYTES: 0 %
LYMPHS: 51 %
Lymphocytes Absolute: 2.9 10*3/uL (ref 0.7–3.1)
MCH: 24.3 pg — AB (ref 26.6–33.0)
MCHC: 31.8 g/dL (ref 31.5–35.7)
MCV: 77 fL — AB (ref 79–97)
MONOCYTES: 7 %
Monocytes Absolute: 0.4 10*3/uL (ref 0.1–0.9)
NEUTROS ABS: 2.1 10*3/uL (ref 1.4–7.0)
NEUTROS PCT: 38 %
Platelets: 351 10*3/uL (ref 150–379)
RBC: 5.51 x10E6/uL — ABNORMAL HIGH (ref 3.77–5.28)
RDW: 16.9 % — ABNORMAL HIGH (ref 12.3–15.4)
WBC: 5.6 10*3/uL (ref 3.4–10.8)

## 2016-06-03 ENCOUNTER — Ambulatory Visit: Payer: Commercial Managed Care - HMO | Admitting: Gastroenterology

## 2016-06-15 ENCOUNTER — Encounter: Payer: Self-pay | Admitting: Nurse Practitioner

## 2016-06-15 ENCOUNTER — Ambulatory Visit (INDEPENDENT_AMBULATORY_CARE_PROVIDER_SITE_OTHER): Payer: Commercial Managed Care - HMO | Admitting: Nurse Practitioner

## 2016-06-15 VITALS — BP 159/72 | HR 50 | Temp 97.8°F | Ht 65.0 in | Wt 242.8 lb

## 2016-06-15 DIAGNOSIS — R1013 Epigastric pain: Secondary | ICD-10-CM | POA: Diagnosis not present

## 2016-06-15 DIAGNOSIS — K589 Irritable bowel syndrome without diarrhea: Secondary | ICD-10-CM | POA: Diagnosis not present

## 2016-06-15 DIAGNOSIS — K5792 Diverticulitis of intestine, part unspecified, without perforation or abscess without bleeding: Secondary | ICD-10-CM | POA: Diagnosis not present

## 2016-06-15 NOTE — Progress Notes (Signed)
Referring Provider: [name], MD Primary Care Physician:  [name], MD Primary GI:  Dr. Oneida Alar  Chief Complaint  Patient presents with  . Follow-up    HPI:   [name] is a 70 y.o. female who presents For follow-up. The patient was last seen in our office 01/22/2016 for dyspepsia. At that time she was deemed new-onset with differentials including H. pylori gastritis, uncontrolled GERD, IBS see, less likely GE junction tumor, gastric or pancreatic cancer, chronic mesenteric ischemia. Recommended continue weight loss efforts, high-fiber last diabetic diet, decreased dairy intake, Linzess, Bentyl once a day, Protonix. She was scheduled for upper endoscopy.  EGD was completed 02/02/2016 and found normal esophagus, mild gastritis status post biopsy, normal duodenum. Recommended low-fat diet, trigger avoidance, continue Protonix once a day. 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 No H. Pylori.  She was hospitalized on 05/04/2016 through 05/06/2016 for diverticulitis without abscess or perforation. Her pain improved and resolved on Cipro and Flagyl. She was discharged on antibiotics in order to obtain a complete course. Recommended follow-up with PCP.  Today she states she's feeling better since hospitalization. Occasional abdominal pain "but nothing like the pain I was having in the hospital." She finished her antibiotics. Denies N/V, hematochezia, melena, fever, chills, unintentional weight loss. She is trying to lose weight but not having a lot of success. She has seen a nutritionist and their information seemed to conflict with what was given to her in the hospital for diverticulitis recovery. Discussed diverticulitis diet to help acute recovery versus long term weight loss diet. She sees the nutritionist again 06/23/16. Still has mild bloating ongoing with mild discomfort. Is still avoiding  dairy. However there were recently 3 family members with birthdays only 1-2 weeks apart and "cheated" with ice cream, thinks this may be the source of her current bloating. She is under significant stress which she thinks exacerbates her bloating and abdominal pain. Constipation and GERD well controlled. Denies chest pain, dyspnea, dizziness, lightheadedness, syncope, near syncope. Denies any other upper or lower GI symptoms.  Past Medical History:  Diagnosis Date  . Blood clot of artery under arm ([place])   . Cyst on liver  . Depression   . Diabetes mellitus    controlled by diet  . Diverticulitis   . DVT of axillary vein, acute right ([place]) 11/24/2012   Dx on Jan 28, 2012.    . Fibrocystic breast disease   . Hepatic cyst   . Hypertension   . IBS (irritable bowel syndrome)   . Impaired glucose tolerance   . Menopause     Past Surgical History:  Procedure Laterality Date  . ABDOMINAL HYSTERECTOMY    . BACK SURGERY    . BREAST SURGERY  right   cyst removed  . CAST APPLICATION  [id number]   Procedure: CAST APPLICATION;  Surgeon: Carole Civil, MD;  Location: [facility];  Service: Orthopedics;  Laterality: Right;  procedure room   . CHOLECYSTECTOMY    . COLONOSCOPY  12/04/2004   NUR:Few small diverticula at sigmoid colon/Small cecal polyp ablated   . COLONOSCOPY N/A 09/24/2014   SLF: small internal hemorrhoids  . ESOPHAGOGASTRODUODENOSCOPY N/A 02/02/2016   Procedure: ESOPHAGOGASTRODUODENOSCOPY (EGD);  Surgeon: Danie Binder, MD;  Location: [facility];  Service: Endoscopy;  Laterality: N/A;  930   . KNEE ARTHROCENTESIS  left  . TUBAL LIGATION      Current Outpatient  Prescriptions  Medication Sig Dispense Refill  . acetaminophen (TYLENOL) 500 MG tablet Take 500 mg by mouth every 6 (six) hours as needed for mild pain or headache.     . Biotin w/ Vitamins C & E (HAIR SKIN & NAILS GUMMIES PO) Take 1 each by mouth 2 (two) times daily.    . Cholecalciferol (VITAMIN D3 PO) Take 5,000  Units by mouth daily.     [name] dicyclomine (BENTYL) 20 MG tablet Take 1 tablet (20 mg total) by mouth 3 (three) times daily as needed for spasms. 45 tablet 3  . glipiZIDE (GLUCOTROL) 5 MG tablet Take 0.5 tablets (2.5 mg total) by mouth daily before breakfast. 45 tablet 1  . glucose blood (TRUE METRIX BLOOD GLUCOSE TEST) test strip TEST BLOOD SUGAR ONE TIME DAILY 100 each 3  . hydrochlorothiazide (HYDRODIURIL) 12.5 MG tablet Take 1 tablet (12.5 mg total) by mouth daily. 90 tablet 1  . linaclotide (LINZESS) 145 MCG CAPS capsule 1 PO 30 mins prior to your first meal (Patient taking differently: Take 145 mcg by mouth daily before breakfast. 1 PO 30 mins prior to your first meal) 30 capsule 11  . losartan (COZAAR) 50 MG tablet Take 1 tablet (50 mg total) by mouth daily. 30 tablet 0  . Multiple Vitamins-Minerals (MULTIVITAMIN WITH MINERALS) tablet Take 1 tablet by mouth daily.    . pantoprazole (PROTONIX) 40 MG tablet Take 1 tablet (40 mg total) by mouth daily. 30 tablet 3  . pravastatin (PRAVACHOL) 20 MG tablet Take 1 tablet (20 mg total) by mouth daily. 90 tablet 1  . traMADol (ULTRAM) 50 MG tablet Take 1 tablet (50 mg total) by mouth every 8 (eight) hours as needed. 30 tablet 0   No current facility-administered medications for this visit.     Allergies as of 06/15/2016 - Review Complete 06/15/2016  Allergen Reaction Noted  . Norvasc [amlodipine besylate]  11/21/2012  . Penicillins Hives   . Advair diskus [fluticasone-salmeterol] Palpitations 11/21/2012  . Metformin and related  09/28/2013    Family History  Problem Relation Age of Onset  . Stroke Mother   . Heart attack Mother   . Cancer Sister   . Diabetes Sister   . Seizures Brother   . Diabetes Brother   . Heart disease Brother   . Colon cancer Neg Hx     Social History   Social History  . Marital status: Married    Spouse name: N/A  . Number of children: N/A  . Years of education: N/A   Occupational History  . Retired     Social History Main Topics  . Smoking status: Never Smoker  . Smokeless tobacco: Never Used     Comment: Never smoked  . Alcohol use No  . Drug use: No  . Sexual activity: Yes   Other Topics Concern  . None   Social History Narrative  . None    Review of Systems: Complete ROS negative except as per HPI.   Physical Exam: BP (!) 159/72   Pulse (!) 50   Temp 97.8 F (36.6 C) (Oral)   Ht 5\' 5"  (1.651 m)   Wt 242 lb 12.8 oz (110.1 kg)   BMI 40.40 kg/m  General:   Obese female. Alert and oriented. Pleasant and cooperative. Well-nourished and well-developed.  Head:  Normocephalic and atraumatic. Eyes:  Without icterus, sclera clear and conjunctiva pink.  Ears:  Normal auditory acuity. Cardiovascular:  S1, S2 present without murmurs appreciated. Extremities without clubbing  or edema. Respiratory:  Clear to auscultation bilaterally. No wheezes, rales, or rhonchi. No distress.  Gastrointestinal:  +BS, rounded but soft, non-tender and non-distended. No HSM noted. No guarding or rebound. No masses appreciated.  Rectal:  Deferred  Musculoskalatal:  Symmetrical without gross deformities. Neurologic:  Alert and oriented x4;  grossly normal neurologically. Psych:  Alert and cooperative. Normal mood and affect. Heme/Lymph/Immune: No excessive bruising noted.    06/15/2016 9:10 AM   Disclaimer: This note was dictated with voice recognition software. Similar sounding words can inadvertently be transcribed and may not be corrected upon review.

## 2016-06-15 NOTE — Assessment & Plan Note (Addendum)
Irritable bowel syndrome constipation type currently well-controlled on Linzess. Minimal abdominal discomfort at this time, likely related to stress. Obesity is not helping, has a follow-up appointment with nutritionist next week. Continue current medications, return for follow-up in 6 months.

## 2016-06-15 NOTE — Patient Instructions (Signed)
1. Continue current medications. 2. Avoid dairy products. 3. Follow-up with the nutritionist next week. 4. Notify us of any worsening or recurrent symptoms. 5. Return for follow-up in 6 months, or sooner if needed.

## 2016-06-15 NOTE — Progress Notes (Signed)
cc'ed to pcp °

## 2016-06-15 NOTE — Assessment & Plan Note (Signed)
Recent hospitalization with acute diverticulitis, uncomplicated. She is finished her antibiotics. She is no longer symptomatic. Recommend she continue to monitor, notify us or her primary care for symptoms return.

## 2016-06-15 NOTE — Assessment & Plan Note (Signed)
Currently well-controlled on PPI. Continue current medications, return for follow-up in 6 months or sooner if symptoms worsen.

## 2016-06-17 ENCOUNTER — Telehealth: Payer: Self-pay | Admitting: Gastroenterology

## 2016-06-17 MED ORDER — LINACLOTIDE 145 MCG PO CAPS: ORAL_CAPSULE | ORAL | 3 refills | Status: DC

## 2016-06-17 NOTE — Telephone Encounter (Signed)
Routing to the refill box. 

## 2016-06-17 NOTE — Addendum Note (Signed)
Addended by: Annitta Needs on: 06/17/2016 02:12 PM   Modules accepted: Orders

## 2016-06-17 NOTE — Telephone Encounter (Signed)
Completed.

## 2016-06-17 NOTE — Telephone Encounter (Signed)
Pt called this morning to say that she needed Linzess refilled and to call [facility] at [phone]

## 2016-06-23 ENCOUNTER — Encounter: Payer: Commercial Managed Care - HMO | Attending: Family Medicine | Admitting: Nutrition

## 2016-06-23 ENCOUNTER — Encounter: Payer: Self-pay | Admitting: Nutrition

## 2016-06-23 VITALS — Ht 65.0 in | Wt 246.0 lb

## 2016-06-23 DIAGNOSIS — E119 Type 2 diabetes mellitus without complications: Secondary | ICD-10-CM

## 2016-06-23 NOTE — Patient Instructions (Addendum)
Goals 1. Follow Low Residue Diet 2. Increase water to 4 bottles per day to not get constipated. 3. Walk 10 minutes per day or [employer] for water aerobics Lose 1-2 lbs per week

## 2016-06-23 NOTE — Progress Notes (Addendum)
Diabetes Self-Management Education  Visit Type:  Follow-up  Appt. Start Time: 0930 Appt. End Time: 1000  06/23/2016  [name], identified by name and date of birth, is a 70 y.o. female with a diagnosis of Diabetes: Just got out of hospital twice with Diverticulits and URI. On Low residue diet. Stomach hurts often. MD says it will take time to heal.  Lost 4 lbs.    Trying to start walking and wants to get back to the [facility]. A1C down to 5.8% from 6.2% last visit. Glipizide cut to 1/2 pill.   ASSESSMENT  Height 5\' 5"  (1.651 m), weight 246 lb (111.6 kg). Body mass index is 40.94 kg/m.       Diabetes Self-Management Education - 06/23/16 1130      Health Coping   How would you rate your overall health? Good     Psychosocial Assessment   Patient Belief/Attitude about Diabetes Motivated to manage diabetes     Pre-Education Assessment   Patient understands the diabetes disease and treatment process. Needs Review   Patient understands incorporating nutritional management into lifestyle. Needs Review   Patient undertands incorporating physical activity into lifestyle. Needs Review   Patient understands using medications safely. Needs Review   Patient understands monitoring blood glucose, interpreting and using results Demonstrates understanding / competency   Patient understands prevention, detection, and treatment of acute complications. Demonstrates understanding / competency   Patient understands prevention, detection, and treatment of chronic complications. Demonstrates understanding / competency   Patient understands how to develop strategies to address psychosocial issues. Demonstrates understanding / competency   Patient understands how to develop strategies to promote health/change behavior. Demonstrates understanding / competency     Complications   Last HgB A1C per patient/outside source 5.8 %   How often do you check your blood sugar? 1-2 times/day   Fasting  Blood glucose range (mg/dL) 70-129   Postprandial Blood glucose range (mg/dL) 70-129   Number of hypoglycemic episodes per month 0   Number of hyperglycemic episodes per week 0     Dietary Intake   Breakfast Eggs and toast or oatmeal, water   Lunch sandiwch and fruit, water   Dinner Meat and vegetables or salad.. water   Beverage(s) water     Exercise   Exercise Type Light (walking / raking leaves)   How many days per week to you exercise? 2   How many minutes per day do you exercise? 20   Total minutes per week of exercise 40     Patient Education   Previous Diabetes Education Yes (please comment)   Physical activity and exercise  Role of exercise on diabetes management, blood pressure control and cardiac health.   Monitoring Taught/evaluated SMBG meter.;Purpose and frequency of SMBG.;Taught/discussed recording of test results and interpretation of SMBG.;Identified appropriate SMBG and/or A1C goals.;Interpreting lab values - A1C, lipid, urine microalbumina.   Chronic complications Relationship between chronic complications and blood glucose control;Lipid levels, blood glucose control and heart disease;Assessed and discussed foot care and prevention of foot problems;Identified and discussed with patient  current chronic complications;Retinopathy and reason for yearly dilated eye exams;Nephropathy, what it is, prevention of, the use of ACE, ARB's and early detection of through urine microalbumia.   Psychosocial adjustment Worked with patient to identify barriers to care and solutions;Travel strategies;Role of stress on diabetes   Personal strategies to promote health Lifestyle issues that need to be addressed for better diabetes care;Helped patient develop diabetes management plan for (enter comment)  Individualized Goals (developed by patient)   Nutrition Follow meal plan discussed;General guidelines for healthy choices and portions discussed;Adjust meds/carbs with exercise as discussed    Physical Activity Exercise 3-5 times per week;30 minutes per day   Medications take my medication as prescribed   Monitoring  test my blood glucose as discussed;send in my blood glucose log as discussed;test blood glucose pre and post meals as discussed   Problem Solving --  Diverticulitis,, low residue diet and slowing increase to a high fiber diet.   Reducing Risk examine blood glucose patterns;get labs drawn;do foot checks daily;increase portions of healthy fats   Health Coping ask for help with (comment)  Diverticulitis and obesity     Patient Self-Evaluation of Goals - Patient rates self as meeting previously set goals (% of time)   Nutrition 50 - 75 %   Physical Activity 25 - 50%   Medications >75%   Monitoring >75%   Problem Solving 50 - 75 %   Reducing Risk 50 - 75 %   Health Coping 50 - 75 %     Post-Education Assessment   Patient understands the diabetes disease and treatment process. Demonstrates understanding / competency   Patient understands incorporating nutritional management into lifestyle. Needs Review   Patient undertands incorporating physical activity into lifestyle. Needs Review   Patient understands using medications safely. Demonstrates understanding / competency   Patient understands monitoring blood glucose, interpreting and using results Demonstrates understanding / competency   Patient understands prevention, detection, and treatment of acute complications. Demonstrates understanding / competency     Outcomes   Program Status Completed     Subsequent Visit   Since your last visit have you continued or begun to take your medications as prescribed? Yes   Since your last visit have you had your blood pressure checked? Yes   Is your most recent blood pressure lower, unchanged, or higher since your last visit? Lower   Since your last visit have you experienced any weight changes? Loss   Weight Loss (lbs) 4   Since your last visit, are you checking your  blood glucose at least once a day? Yes      Learning Objective:  Patient will have a greater understanding of diabetes self-management. Patient education plan is to attend individual and/or group sessions per assessed needs and concerns.   Plan:   Goals 1. Follow Low Residue Diet 2. Increase water to 4 bottles per day to not get constipated. 3. Walk 10 minutes per day or [employer] for water aerobics Lose 1-2 lbs per week   Expected Outcomes:  Demonstrated interest in learning. Expect positive outcomes  Education material provided: My Plate  Low Residue diet If problems or questions, patient to contact team via:  Phone and Email  Future DSME appointment: - 3-4 months

## 2016-07-09 ENCOUNTER — Other Ambulatory Visit: Payer: Self-pay | Admitting: Family Medicine

## 2016-07-13 ENCOUNTER — Other Ambulatory Visit: Payer: Self-pay | Admitting: *Deleted

## 2016-07-13 MED ORDER — LOSARTAN POTASSIUM 50 MG PO TABS: 50.0000 mg | ORAL_TABLET | Freq: Every day | ORAL | 0 refills | Status: DC

## 2016-07-18 ENCOUNTER — Encounter (HOSPITAL_COMMUNITY): Payer: Self-pay | Admitting: Emergency Medicine

## 2016-07-18 ENCOUNTER — Emergency Department (HOSPITAL_COMMUNITY)
Admission: EM | Admit: 2016-07-18 | Discharge: 2016-07-18 | Disposition: A | Discharge status: Home / Self Care | Payer: Commercial Managed Care - HMO | Attending: Emergency Medicine | Admitting: Emergency Medicine

## 2016-07-18 DIAGNOSIS — Z79899 Other long term (current) drug therapy: Secondary | ICD-10-CM | POA: Insufficient documentation

## 2016-07-18 DIAGNOSIS — Z7984 Long term (current) use of oral hypoglycemic drugs: Secondary | ICD-10-CM | POA: Diagnosis not present

## 2016-07-18 DIAGNOSIS — E119 Type 2 diabetes mellitus without complications: Secondary | ICD-10-CM | POA: Insufficient documentation

## 2016-07-18 DIAGNOSIS — I10 Essential (primary) hypertension: Secondary | ICD-10-CM | POA: Insufficient documentation

## 2016-07-18 DIAGNOSIS — T7840XA Allergy, unspecified, initial encounter: Secondary | ICD-10-CM | POA: Diagnosis not present

## 2016-07-18 DIAGNOSIS — R21 Rash and other nonspecific skin eruption: Secondary | ICD-10-CM | POA: Diagnosis present

## 2016-07-18 MED ORDER — PREDNISONE 10 MG PO TABS: 20.0000 mg | ORAL_TABLET | Freq: Every day | ORAL | 0 refills | Status: DC

## 2016-07-18 MED ORDER — DIPHENHYDRAMINE HCL 50 MG/ML IJ SOLN
INTRAMUSCULAR | Status: AC
  Filled 2016-07-18: qty 1

## 2016-07-18 MED ORDER — SODIUM CHLORIDE 0.9 % IV BOLUS (SEPSIS)
500.0000 mL | Freq: Once | INTRAVENOUS | Status: AC
  Administered 2016-07-18: 500 mL via INTRAVENOUS

## 2016-07-18 MED ORDER — FAMOTIDINE IN NACL 20-0.9 MG/50ML-% IV SOLN
20.0000 mg | Freq: Once | INTRAVENOUS | Status: AC
  Administered 2016-07-18: 20 mg via INTRAVENOUS
  Filled 2016-07-18: qty 50

## 2016-07-18 MED ORDER — DIPHENHYDRAMINE HCL 50 MG/ML IJ SOLN
25.0000 mg | Freq: Once | INTRAMUSCULAR | Status: AC
  Administered 2016-07-18: 25 mg via INTRAVENOUS
  Filled 2016-07-18: qty 1

## 2016-07-18 MED ORDER — METHYLPREDNISOLONE SODIUM SUCC 125 MG IJ SOLR
125.0000 mg | Freq: Once | INTRAMUSCULAR | Status: AC
  Administered 2016-07-18: 125 mg via INTRAVENOUS
  Filled 2016-07-18: qty 2

## 2016-07-18 NOTE — ED Provider Notes (Signed)
[facility] DEPT Provider Note   CSN: [id number] Arrival date & time: 07/18/16  [id number]   By signing my name below, I, Macon Large, attest that this documentation has been prepared under the direction and in the presence of Nat Christen, MD. Electronically Signed: Macon Large, ED Scribe. 07/18/16. 10:26 AM.  History   Chief Complaint Chief Complaint  Patient presents with  . Urticaria   The history is provided by the patient. No language interpreter was used.   HPI Comments: [name] is a 70 y.o. female who presents to the Emergency Department complaining of sudden, onset pruritic rashes that occurred yesterday. Pt notes having rashes on her chest, abdomen, arms and back that occurred yesterday. Pt reports associated itching accompanied with a burning sensation. Pt notes she is breathing and swallowing with no difficulty. Pt notes taking benadryl and oatmeal bath with no significant relief. Pt reports no modifying factors noted. Pt denies any sick contact recently. Pt reports she has been taking taking bentyl and notes her pill was a different color for her most recent refill. She reports taking took one Tuesday and Friday. No additional complaints at this time.    Past Medical History:  Diagnosis Date  . Blood clot of artery under arm ([place])   . Cyst on liver  . Depression   . Diabetes mellitus    controlled by diet  . Diverticulitis   . DVT of axillary vein, acute right ([place]) 11/24/2012   Dx on Jan 28, 2012.    . Fibrocystic breast disease   . Hepatic cyst   . Hypertension   . IBS (irritable bowel syndrome)   . Impaired glucose tolerance   . Menopause     Patient Active Problem List   Diagnosis Date Noted  . Diverticulitis 05/04/2016  . IBS (irritable bowel syndrome) 12/29/2015  . Constipation 02/03/2015  . Dyspepsia   . Loss of weight   . Decreased appetite 09/16/2014  . Unintentional weight loss 09/16/2014  . Diabetes type 2, uncontrolled ([place])  06/27/2013  . DVT of axillary vein, acute right ([place]) 11/24/2012  . Diabetes mellitus type 2, diet-controlled ([place]) 11/15/2012  . Chest pain, unspecified 05/10/2011  . Bradycardia 05/10/2011  . HYPERCHOLESTEROLEMIA 08/29/2008  . DEPRESSION 08/29/2008  . Essential hypertension 08/29/2008  . PEPTIC ULCER DISEASE 08/29/2008  . HIATAL HERNIA 08/29/2008  . HEPATIC CYST 08/29/2008  . OSTEOARTHRITIS 08/29/2008    Past Surgical History:  Procedure Laterality Date  . ABDOMINAL HYSTERECTOMY    . BACK SURGERY    . BREAST SURGERY  right   cyst removed  . CAST APPLICATION  [id number]   Procedure: CAST APPLICATION;  Surgeon: Carole Civil, MD;  Location: [facility] ORS;  Service: Orthopedics;  Laterality: Right;  procedure room   . CHOLECYSTECTOMY    . COLONOSCOPY  12/04/2004   NUR:Few small diverticula at sigmoid colon/Small cecal polyp ablated   . COLONOSCOPY N/A 09/24/2014   SLF: small internal hemorrhoids  . ESOPHAGOGASTRODUODENOSCOPY N/A 02/02/2016   Procedure: ESOPHAGOGASTRODUODENOSCOPY (EGD);  Surgeon: Danie Binder, MD;  Location: [facility] ENDO SUITE;  Service: Endoscopy;  Laterality: N/A;  930   . KNEE ARTHROCENTESIS  left  . TUBAL LIGATION      OB History    Gravida Para Term Preterm AB Living   3 3 3     3    SAB TAB Ectopic Multiple Live Births                   Home  Medications    Prior to Admission medications   Medication Sig Start Date End Date Taking? Authorizing Provider  acetaminophen (TYLENOL) 500 MG tablet Take 500 mg by mouth every 6 (six) hours as needed for mild pain or headache.    Yes Historical Provider, MD  Cholecalciferol (VITAMIN D3 PO) Take 5,000 Units by mouth daily.    Yes Historical Provider, MD  dicyclomine (BENTYL) 20 MG tablet Take 1 tablet (20 mg total) by mouth 3 (three) times daily as needed for spasms. 05/27/16  Yes [name], MD  diphenhydrAMINE (BENADRYL) 25 mg capsule Take 25 mg by mouth every 6 (six) hours as needed.   Yes Historical Provider,  MD  glipiZIDE (GLUCOTROL) 5 MG tablet Take 0.5 tablets (2.5 mg total) by mouth daily before breakfast. 04/23/16  Yes [name], MD  glucose blood (TRUE METRIX BLOOD GLUCOSE TEST) test strip TEST BLOOD SUGAR ONE TIME DAILY 04/23/16  Yes [name], MD  hydrochlorothiazide (HYDRODIURIL) 12.5 MG tablet Take 1 tablet (12.5 mg total) by mouth daily. 04/23/16  Yes [name], MD  linaclotide [facility]) 145 MCG CAPS capsule 1 PO 30 mins prior to your first meal 06/17/16  Yes Annitta Needs, NP  losartan (COZAAR) 50 MG tablet Take 1 tablet (50 mg total) by mouth daily. 07/13/16  Yes [name], MD  Multiple Vitamins-Minerals (MULTIVITAMIN WITH MINERALS) tablet Take 1 tablet by mouth daily.   Yes Historical Provider, MD  pantoprazole (PROTONIX) 40 MG tablet Take 1 tablet (40 mg total) by mouth daily. 01/02/16  Yes Annitta Needs, NP  pravastatin (PRAVACHOL) 20 MG tablet Take 1 tablet (20 mg total) by mouth daily. 04/23/16  Yes [name], MD  predniSONE (DELTASONE) 10 MG tablet Take 2 tablets (20 mg total) by mouth daily. 07/18/16   Nat Christen, MD  traMADol (ULTRAM) 50 MG tablet Take 1 tablet (50 mg total) by mouth every 8 (eight) hours as needed. Patient not taking: Reported on 07/18/2016 05/27/16   [name], MD    Family History Family History  Problem Relation Age of Onset  . Stroke Mother   . Heart attack Mother   . Cancer Sister   . Diabetes Sister   . Seizures Brother   . Diabetes Brother   . Heart disease Brother   . Colon cancer Neg Hx     Social History Social History  Substance Use Topics  . Smoking status: Never Smoker  . Smokeless tobacco: Never Used     Comment: Never smoked  . Alcohol use No     Allergies   Norvasc [amlodipine besylate]; Penicillins; Advair diskus [fluticasone-salmeterol]; and Metformin and related   Review of Systems Review of Systems  Respiratory: Negative for shortness of breath.   Skin: Positive for rash (chest, abdomen, arms, back  ).  All other systems reviewed and are negative.    Physical Exam Updated Vital Signs BP 147/89 (BP Location: Right Arm)   Pulse 76   Temp 98 F (36.7 C) (Oral)   Resp 17   Ht 5' 4.5" (1.638 m)   Wt 214 lb (97.1 kg)   SpO2 100%   BMI 36.17 kg/m   Physical Exam  Constitutional: She is oriented to person, place, and time. She appears well-developed and well-nourished.  HENT:  Head: Normocephalic and atraumatic.  Eyes: Conjunctivae are normal.  Neck: Neck supple.  Cardiovascular: Normal rate and regular rhythm.   Pulmonary/Chest: Effort normal and breath sounds normal.  Abdominal: Soft. Bowel sounds are normal.  Musculoskeletal: Normal range of motion.  Neurological: She is alert and oriented to person, place, and time.  Skin: Skin is warm and dry.  diffused erythematous macular rash.   Psychiatric: She has a normal mood and affect. Her behavior is normal.  Nursing note and vitals reviewed.    ED Treatments / Results   DIAGNOSTIC STUDIES: Oxygen Saturation is 100% on RA, normal by my interpretation.    COORDINATION OF CARE: 9:42 AM Discussed treatment plan with pt at bedside and pt agreed to plan.   Labs (all labs ordered are listed, but only abnormal results are displayed) Labs Reviewed - No data to display  EKG  EKG Interpretation None       Radiology No results found.  Procedures Procedures (including critical care time)  Medications Ordered in ED Medications  diphenhydrAMINE (BENADRYL) 50 MG/ML injection (not administered)  sodium chloride 0.9 % bolus 500 mL (0 mLs Intravenous Stopped 07/18/16 1133)  methylPREDNISolone sodium succinate (SOLU-MEDROL) 125 mg/2 mL injection 125 mg (125 mg Intravenous Given 07/18/16 1010)  diphenhydrAMINE (BENADRYL) injection 25 mg (25 mg Intravenous Given 07/18/16 1015)  famotidine (PEPCID) IVPB 20 mg premix (0 mg Intravenous Stopped 07/18/16 1133)     Initial Impression / Assessment and Plan / ED Course  I have  reviewed the triage vital signs and the nursing notes.  Pertinent labs & imaging results that were available during my care of the patient were reviewed by me and considered in my medical decision making (see chart for details).  Pt will be given IV with predinsone to counteract reaction. Pt will be sent home with prednisone for a couple days to help with reaction. Pt will be given steroids, benadryl and Pepcid.   Clinical Course     Patient feels better after IV Soludrol, IV Benadryl, IV Pepcid. Discharge medication prednisone. No airway issues  Final Clinical Impressions(s) / ED Diagnoses   Final diagnoses:  Allergic reaction, initial encounter    New Prescriptions New Prescriptions   PREDNISONE (DELTASONE) 10 MG TABLET    Take 2 tablets (20 mg total) by mouth daily.    I personally performed the services described in this documentation, which was scribed in my presence. The recorded information has been reviewed and is accurate.      Nat Christen, MD 07/18/16 [phone]

## 2016-07-18 NOTE — ED Triage Notes (Signed)
Patient reports breaking out in rash/hives yesterday with itching. Denies any new medication, foods, lotions, soaps, etc. Rash noted to chest, abd, and back. Per patient rash starting to spread down legs but has cleared some on arms. Denies any swelling or tongue, difficulty breath, or difficulty swallowing.

## 2016-07-18 NOTE — Discharge Instructions (Signed)
Prescription for prednisone. You can also take Benadryl every 4-6 hours.

## 2016-07-18 NOTE — ED Notes (Signed)
Accidentally threw vial of Benadryl in sharps.  New one pulled from pyxis.

## 2016-07-18 NOTE — ED Notes (Signed)
Pt requesting how long before she is allowed to leave.  Advised pt we are observing.  States her rash feels better.

## 2016-07-28 ENCOUNTER — Other Ambulatory Visit (HOSPITAL_COMMUNITY)
Admission: RE | Admit: 2016-07-28 | Discharge: 2016-07-28 | Disposition: A | Discharge status: Home / Self Care | Payer: Commercial Managed Care - HMO | Source: Ambulatory Visit | Attending: Family Medicine | Admitting: Family Medicine

## 2016-07-28 ENCOUNTER — Encounter: Payer: Self-pay | Admitting: Family Medicine

## 2016-07-28 ENCOUNTER — Ambulatory Visit (INDEPENDENT_AMBULATORY_CARE_PROVIDER_SITE_OTHER): Payer: Commercial Managed Care - HMO | Admitting: Family Medicine

## 2016-07-28 VITALS — BP 122/82 | Ht 65.0 in | Wt 244.2 lb

## 2016-07-28 DIAGNOSIS — R079 Chest pain, unspecified: Secondary | ICD-10-CM | POA: Diagnosis not present

## 2016-07-28 DIAGNOSIS — M940 Chondrocostal junction syndrome [Tietze]: Secondary | ICD-10-CM | POA: Diagnosis not present

## 2016-07-28 LAB — BASIC METABOLIC PANEL
Anion gap: 9 (ref 5–15)
BUN: 19 mg/dL (ref 6–20)
CALCIUM: 8.7 mg/dL — AB (ref 8.9–10.3)
CO2: 28 mmol/L (ref 22–32)
Chloride: 99 mmol/L — ABNORMAL LOW (ref 101–111)
Creatinine, Ser: 0.84 mg/dL (ref 0.44–1.00)
GFR calc Af Amer: >60 mL/min (ref >60)
GLUCOSE: 87 mg/dL (ref 65–99)
Potassium: 4.2 mmol/L (ref 3.5–5.1)
Sodium: 136 mmol/L (ref 135–145)

## 2016-07-28 LAB — D-DIMER, QUANTITATIVE (NOT AT ARMC): D DIMER QUANT: 0.31 ug{FEU}/mL (ref 0.00–0.50)

## 2016-07-28 MED ORDER — NAPROXEN 500 MG PO TABS: 500.0000 mg | ORAL_TABLET | Freq: Two times a day (BID) | ORAL | 1 refills | Status: DC

## 2016-07-28 NOTE — Progress Notes (Signed)
   Subjective:    Patient ID: [name], female    DOB: 01/04/1946, 70 y.o.   MRN: [id number]  HPI Patient with c/o left breast pain for a few days. Aching pain left side This patient is been having the past couple weeks left side chest pain worse over the past few days she describes this as ache sometimes a sharp pain sometimes a burning pain is across left side of her chest into the breast and some discomfort into the ER she also states she gets a little short of breath with it sometimes comes on at rest sometimes comes on with activity. She denies any vomiting or diarrhea. She does have risk factors for heart disease she is had a negative catheterization over 10 years ago.   Review of Systems She describes chest pain discomfort on the left side with heaviness into left arm some shortness of breath also left breast. She denies nausea vomiting diarrhea fever chills sweats Pain is sometimes at rest sometimes with activity    Objective:   Physical Exam Neck no masses lungs are clear no crackles heart is regular chest wall nontender extremities no edema skin warm dry  EKG no acute changes     Assessment & Plan:  Atypical chest pain Cardiology referral-because of the atypical symptoms but because of female status with risk factors I believe this needs further looking into Stat d-dimer EKG today looks normal Patient was told that based upon this may need to do CT scan of the chest to rule out possibility of pulmonary embolus Patient does have history of a blood clot a few years ago Patient up-to-date on mammogram none necessary I doubt that this is breast discomfort due to a breast problem Naprosyn 500 twice a day for the next 10-14 days

## 2016-07-29 ENCOUNTER — Encounter: Payer: Self-pay | Admitting: Family Medicine

## 2016-07-30 ENCOUNTER — Encounter (HOSPITAL_COMMUNITY): Payer: Self-pay | Admitting: *Deleted

## 2016-07-30 ENCOUNTER — Telehealth: Payer: Self-pay | Admitting: Family Medicine

## 2016-07-30 ENCOUNTER — Emergency Department (HOSPITAL_COMMUNITY)
Admission: EM | Admit: 2016-07-30 | Discharge: 2016-07-30 | Disposition: A | Discharge status: Home / Self Care | Payer: Commercial Managed Care - HMO | Attending: Emergency Medicine | Admitting: Emergency Medicine

## 2016-07-30 ENCOUNTER — Emergency Department (HOSPITAL_COMMUNITY): Payer: Commercial Managed Care - HMO

## 2016-07-30 DIAGNOSIS — I10 Essential (primary) hypertension: Secondary | ICD-10-CM | POA: Insufficient documentation

## 2016-07-30 DIAGNOSIS — R079 Chest pain, unspecified: Secondary | ICD-10-CM

## 2016-07-30 DIAGNOSIS — Z791 Long term (current) use of non-steroidal anti-inflammatories (NSAID): Secondary | ICD-10-CM | POA: Diagnosis not present

## 2016-07-30 DIAGNOSIS — E119 Type 2 diabetes mellitus without complications: Secondary | ICD-10-CM | POA: Diagnosis not present

## 2016-07-30 DIAGNOSIS — R0789 Other chest pain: Secondary | ICD-10-CM | POA: Diagnosis not present

## 2016-07-30 DIAGNOSIS — Z79899 Other long term (current) drug therapy: Secondary | ICD-10-CM | POA: Diagnosis not present

## 2016-07-30 DIAGNOSIS — Z7984 Long term (current) use of oral hypoglycemic drugs: Secondary | ICD-10-CM | POA: Insufficient documentation

## 2016-07-30 DIAGNOSIS — E876 Hypokalemia: Secondary | ICD-10-CM | POA: Diagnosis not present

## 2016-07-30 LAB — CBC
HCT: 40.1 % (ref 36.0–46.0)
Hemoglobin: 13 g/dL (ref 12.0–15.0)
MCH: 25.1 pg — AB (ref 26.0–34.0)
MCHC: 32.4 g/dL (ref 30.0–36.0)
MCV: 77.6 fL — ABNORMAL LOW (ref 78.0–100.0)
PLATELETS: 323 10*3/uL (ref 150–400)
RBC: 5.17 MIL/uL — ABNORMAL HIGH (ref 3.87–5.11)
RDW: 17.1 % — AB (ref 11.5–15.5)
WBC: 11 10*3/uL — ABNORMAL HIGH (ref 4.0–10.5)

## 2016-07-30 LAB — BASIC METABOLIC PANEL
Anion gap: 7 (ref 5–15)
BUN: 18 mg/dL (ref 6–20)
CALCIUM: 9.6 mg/dL (ref 8.9–10.3)
CO2: 29 mmol/L (ref 22–32)
CREATININE: 0.91 mg/dL (ref 0.44–1.00)
Chloride: 101 mmol/L (ref 101–111)
GFR calc Af Amer: >60 mL/min (ref >60)
GLUCOSE: 68 mg/dL (ref 65–99)
Potassium: 3.2 mmol/L — ABNORMAL LOW (ref 3.5–5.1)
Sodium: 137 mmol/L (ref 135–145)

## 2016-07-30 LAB — TROPONIN I

## 2016-07-30 MED ORDER — POTASSIUM CHLORIDE CRYS ER 20 MEQ PO TBCR: 20.0000 meq | EXTENDED_RELEASE_TABLET | Freq: Every day | ORAL | 0 refills | Status: DC

## 2016-07-30 NOTE — ED Triage Notes (Signed)
Pt comes in with chest pain starting 3-4 days ago. Since last night her left arm has hurt. Pt describes pain as heaviness. Pt alert and oriented. NAD noted.   Pt was recently seen at PCP's office and they told her it was chest wall inflammation. In addition, pt expressed to this nurse that she was stressed about several things in her life. Pt didn't expand further.

## 2016-07-30 NOTE — Discharge Instructions (Signed)
You can use Tylenol if needed for pain.  Start taking the potassium pills today.  Try to eat foods which contain more potassium.  If you develop a fever or have symptoms of infection, contact your doctor.

## 2016-07-30 NOTE — Telephone Encounter (Signed)
Review blood work results in [facility] faxed over from [facility].

## 2016-07-30 NOTE — ED Provider Notes (Signed)
[facility] DEPT Provider Note   CSN: [id number] Arrival date & time: 07/30/16  1217  By signing my name below, I, Rayna Sexton, attest that this documentation has been prepared under the direction and in the presence of Daleen Bo, MD. Electronically Signed: Rayna Sexton, ED Scribe. 07/30/16. 12:48 PM.   History   Chief Complaint Chief Complaint  Patient presents with  . Chest Pain   HPI HPI Comments: [name] is a 70 y.o. female who presents to the Emergency Department complaining of intermittent, moderate, central and left sided CP x 4 days. Pt states her CP lasts about "2 to 3 minutes" and occurs typically three times per day. Pt states that last night her CP turned into a "heaviness" and spanned from her "chest to her back" with associated left arm "aching". Her most recent episode of CP was this morning. She states it alleviates with rest and worsens with ambulation. She reports associated diaphoresis, nausea and fatigue. Pt was recently evaluated by her PCP for similar symptoms and states her current symptoms are similar to her prior visit. She describes her fatigue as "nearly passing out" which occurs while ambulating. Pt has an appointment with her cardiologist on 08/31/2016. No other associated symptoms at this time.   The history is provided by the patient and medical records. No language interpreter was used.    Past Medical History:  Diagnosis Date  . Blood clot of artery under arm ([place])   . Cyst on liver  . Depression   . Diabetes mellitus    controlled by diet  . Diverticulitis   . DVT of axillary vein, acute right ([place]) 11/24/2012   Dx on Jan 28, 2012.    . Fibrocystic breast disease   . Hepatic cyst   . Hypertension   . IBS (irritable bowel syndrome)   . Impaired glucose tolerance   . Menopause     Patient Active Problem List   Diagnosis Date Noted  . Diverticulitis 05/04/2016  . IBS (irritable bowel syndrome) 12/29/2015  .  Constipation 02/03/2015  . Dyspepsia   . Loss of weight   . Decreased appetite 09/16/2014  . Unintentional weight loss 09/16/2014  . Diabetes type 2, uncontrolled ([place]) 06/27/2013  . DVT of axillary vein, acute right ([place]) 11/24/2012  . Diabetes mellitus type 2, diet-controlled ([place]) 11/15/2012  . Chest pain, unspecified 05/10/2011  . Bradycardia 05/10/2011  . HYPERCHOLESTEROLEMIA 08/29/2008  . DEPRESSION 08/29/2008  . Essential hypertension 08/29/2008  . PEPTIC ULCER DISEASE 08/29/2008  . HIATAL HERNIA 08/29/2008  . HEPATIC CYST 08/29/2008  . OSTEOARTHRITIS 08/29/2008    Past Surgical History:  Procedure Laterality Date  . ABDOMINAL HYSTERECTOMY    . BACK SURGERY    . BREAST SURGERY  right   cyst removed  . CAST APPLICATION  [id number]   Procedure: CAST APPLICATION;  Surgeon: Carole Civil, MD;  Location: [facility] ORS;  Service: Orthopedics;  Laterality: Right;  procedure room   . CHOLECYSTECTOMY    . COLONOSCOPY  12/04/2004   NUR:Few small diverticula at sigmoid colon/Small cecal polyp ablated   . COLONOSCOPY N/A 09/24/2014   SLF: small internal hemorrhoids  . ESOPHAGOGASTRODUODENOSCOPY N/A 02/02/2016   Procedure: ESOPHAGOGASTRODUODENOSCOPY (EGD);  Surgeon: Danie Binder, MD;  Location: [facility] ENDO SU[facility];  Service: Endoscopy;  Laterality: N/A;  930   . KNEE ARTHROCENTESIS  left  . TUBAL LIGATION      OB History    Gravida Para Term Preterm AB Living   3 3  3     3   SAB TAB Ectopic Multiple Live Births                   Home Medications    Prior to Admission medications   Medication Sig Start Date End Date Taking? Authorizing Provider  acetaminophen (TYLENOL) 500 MG tablet Take 1,000 mg by mouth every 6 (six) hours as needed for mild pain, moderate pain or headache.    Yes Historical Provider, MD  Cholecalciferol (VITAMIN D3) 5000 units CAPS Take 5,000 Units by mouth daily.   Yes Historical Provider, MD  dicyclomine (BENTYL) 20 MG tablet Take 1 tablet (20 mg total) by  mouth 3 (three) times daily as needed for spasms. 05/27/16  Yes [name], MD  diphenhydrAMINE (BENADRYL) 25 mg capsule Take 25 mg by mouth every 6 (six) hours as needed for itching or allergies.    Yes Historical Provider, MD  glipiZIDE (GLUCOTROL) 5 MG tablet Take 0.5 tablets (2.5 mg total) by mouth daily before breakfast. 04/23/16  Yes [name], MD  hydrochlorothiazide (HYDRODIURIL) 12.5 MG tablet Take 1 tablet (12.5 mg total) by mouth daily. 04/23/16  Yes [name], MD  linaclotide (LINZESS) 145 MCG CAPS capsule Take 145 mcg by mouth daily before breakfast.   Yes Historical Provider, MD  losartan (COZAAR) 50 MG tablet Take 1 tablet (50 mg total) by mouth daily. 07/13/16  Yes [name], MD  Multiple Vitamins-Minerals (MULTIVITAMIN WITH MINERALS) tablet Take 1 tablet by mouth daily.   Yes Historical Provider, MD  naproxen (NAPROSYN) 500 MG tablet Take 1 tablet (500 mg total) by mouth 2 (two) times daily with a meal. 07/28/16  Yes [name], MD  pantoprazole (PROTONIX) 40 MG tablet Take 1 tablet (40 mg total) by mouth daily. 01/02/16  Yes Annitta Needs, NP  pravastatin (PRAVACHOL) 20 MG tablet Take 20 mg by mouth at bedtime.   Yes Historical Provider, MD  potassium chloride SA (K-DUR,KLOR-CON) 20 MEQ tablet Take 1 tablet (20 mEq total) by mouth daily. 07/30/16   Daleen Bo, MD    Family History Family History  Problem Relation Age of Onset  . Stroke Mother   . Heart attack Mother   . Cancer Sister   . Diabetes Sister   . Seizures Brother   . Diabetes Brother   . Heart disease Brother   . Colon cancer Neg Hx     Social History Social History  Substance Use Topics  . Smoking status: Never Smoker  . Smokeless tobacco: Never Used     Comment: Never smoked  . Alcohol use No     Allergies   Norvasc [amlodipine besylate]; Penicillins; Advair diskus [fluticasone-salmeterol]; and Metformin and related   Review of Systems Review of Systems  Constitutional:  Positive for diaphoresis and fatigue.  Cardiovascular: Positive for chest pain.  Gastrointestinal: Positive for nausea. Negative for vomiting.  Neurological: Positive for light-headedness. Negative for syncope.  All other systems reviewed and are negative.  Physical Exam Updated Vital Signs BP 162/62   Pulse 61   Temp 97.8 F (36.6 C) (Oral)   Resp 15   Ht 5\' 5"  (1.651 m)   Wt 244 lb (110.7 kg)   SpO2 100%   BMI 40.60 kg/m   Physical Exam  Constitutional: She is oriented to person, place, and time. She appears well-developed and well-nourished.  HENT:  Head: Normocephalic and atraumatic.  Eyes: Conjunctivae and EOM are normal. Pupils are equal, round,  and reactive to light.  Neck: Normal range of motion and phonation normal. Neck supple.  Cardiovascular: Normal rate and regular rhythm.   Pulmonary/Chest: Effort normal and breath sounds normal. She exhibits no tenderness.  Abdominal: Soft. She exhibits no distension. There is no tenderness. There is no guarding.  Musculoskeletal: Normal range of motion.  Neurological: She is alert and oriented to person, place, and time. She exhibits normal muscle tone.  Skin: Skin is warm and dry.  Psychiatric: She has a normal mood and affect. Her behavior is normal. Judgment and thought content normal.  Nursing note and vitals reviewed.  ED Treatments / Results  Labs (all labs ordered are listed, but only abnormal results are displayed) Labs Reviewed  BASIC METABOLIC PANEL - Abnormal; Notable for the following:       Result Value   Potassium 3.2 (*)    All other components within normal limits  CBC - Abnormal; Notable for the following:    WBC 11.0 (*)    RBC 5.17 (*)    MCV 77.6 (*)    MCH 25.1 (*)    RDW 17.1 (*)    All other components within normal limits  TROPONIN I    EKG  EKG Interpretation  Date/Time:  Friday July 30 2016 12:25:16 EST Ventricular Rate:  62 PR Interval:    QRS Duration: 102 QT  Interval:  412 QTC Calculation: 419 R Axis:   -22 Text Interpretation:  Sinus rhythm Borderline left axis deviation Low voltage, precordial leads No significant change since last tracing Confirmed by [email or web address]  MD, Ortley [phone]) on 07/30/2016 12:31:35 PM       Radiology Dg Chest 2 View  Result Date: 07/30/2016 CLINICAL DATA:  Mid chest pain EXAM: CHEST  2 VIEW COMPARISON:  05/05/2016 FINDINGS: EKG leads create artifact over the mediastinum and left chest. Normal heart size and mediastinal contours. Atherosclerotic calcification of the aortic arch. No acute infiltrate or edema. No effusion or pneumothorax. Bulky thoracic spondylosis. No acute osseous findings. IMPRESSION: Stable.  No evidence of active disease. Electronically Signed   By: Monte Fantasia M.D.   On: 07/30/2016 13:06    Procedures Procedures  COORDINATION OF CARE: 12:47 PM Discussed next steps with pt. Pt verbalized understanding and is agreeable with the plan.    Medications Ordered in ED Medications - No data to display   Initial Impression / Assessment and Plan / ED Course  I have reviewed the triage vital signs and the nursing notes.  Pertinent labs & imaging results that were available during my care of the patient were reviewed by me and considered in my medical decision making (see chart for details).  Clinical Course    Medications - No data to display  Patient Vitals for the past 24 hrs:  BP Temp Temp src Pulse Resp SpO2 Height Weight  07/30/16 1230 162/62 - - 61 15 100 % - -  07/30/16 1228 159/59 - - 66 18 100 % - -  07/30/16 1227 - 97.8 F (36.6 C) Oral - - - 5\' 5"  (1.651 m) 244 lb (110.7 kg)    1:55 PM Reevaluation with update and discussion. After initial assessment and treatment, an updated evaluation reveals no change in clinical status.  She remains comfortable and pain-free. We discussed the findings, and the nonspecific elevation of white blood cell count. She again denies symptoms of urinary  tract infection, diarrhea, productive cough, or abdominal pain. She has been on potassium in the past, but not  currently. She has a follow-up appointment with her PCP on 08/12/16.  All questions were answered. Daleen Bo L     I personally performed the services described in this documentation, which was scribed in my presence. The recorded information has been reviewed and is accurate.    Final Clinical Impressions(s) / ED Diagnoses   Final diagnoses:  Nonspecific chest pain  Hypokalemia    Nonspecific chest pain, unlikely to represent acute coronary syndrome. She is low risk for cardiac disease. She has follow-up planned, with cardiology, and access to primary care follow-up. Incidental hypokalemia, and leukocytosis. Tests are serious bacterial  infection, metabolic instability or impending vascular collapse.  Nursing Notes Reviewed/ Care Coordinated Applicable Imaging Reviewed Interpretation of Laboratory Data incorporated into ED treatment  The patient appears reasonably screened and/or stabilized for discharge and I doubt any other medical condition or other [facility] requiring further screening, evaluation, or treatment in the ED at this time prior to discharge.  Plan: Home Medications- continue; Home Treatments- rest, increase potassium in foods; return here if the recommended treatment, does not improve the symptoms; Recommended follow up- PCP as scheduled and prn   New Prescriptions New Prescriptions   POTASSIUM CHLORIDE SA (K-DUR,KLOR-CON) 20 MEQ TABLET    Take 1 tablet (20 mEq total) by mouth daily.     Daleen Bo, MD 07/30/16 [phone]

## 2016-08-01 NOTE — Telephone Encounter (Signed)
The results of the lab work had Arty been previously discussed with the patient. This was just a faxed copy thank you

## 2016-08-06 ENCOUNTER — Ambulatory Visit (INDEPENDENT_AMBULATORY_CARE_PROVIDER_SITE_OTHER): Payer: Commercial Managed Care - HMO | Admitting: Cardiology

## 2016-08-06 ENCOUNTER — Encounter: Payer: Self-pay | Admitting: Cardiology

## 2016-08-06 VITALS — BP 130/66 | HR 84 | Ht 64.0 in | Wt 245.0 lb

## 2016-08-06 DIAGNOSIS — R079 Chest pain, unspecified: Secondary | ICD-10-CM

## 2016-08-06 NOTE — Patient Instructions (Signed)
Medication Instructions:  Your physician recommends that you continue on your current medications as directed. Please refer to the Current Medication list given to you today.   Labwork: none  Testing/Procedures: Your physician has requested that you have en exercise stress myoview. For further information please visit [email or web address]. Please follow instruction sheet, as given.    Follow-Up: Your physician recommends that you schedule a follow-up appointment in: to be determined    Any Other Special Instructions Will Be Listed Below (If Applicable).     If you need a refill on your cardiac medications before your next appointment, please call your pharmacy.   

## 2016-08-06 NOTE — Progress Notes (Signed)
Clinical Summary [name] is a 70 y.o.female seen today as a new consult, she is referred by Dr Wolfgang Phoenix.   1. Chest pain - recent ER visit with chest pain.  -  negative D-dimer, trop neg. CXR no acute process- 05/2011 stress echo no ischemia  - recent episodes of chest pain. Started about 1 month ago. Nagging pain midchest radiating to left and left arm, 8/10 in severity. Mainly occurs with exertion. Can get diaphoretic. Can have some heaviness. Pain can last up to 5 minutes. Not positional. Occurs 3 times a week.  - can had some diaphoresis with walking which is new for her. Example walking at mall. CAD risk factors: HTN, DM2, HL, mother MI "middle age", sister MI. - asked to stop aspirin by GI in the past she reports  2. History of DVT   Past Medical History:  Diagnosis Date  . Blood clot of artery under arm ([place])   . Cyst on liver  . Depression   . Diabetes mellitus    controlled by diet  . Diverticulitis   . DVT of axillary vein, acute right ([place]) 11/24/2012   Dx on Jan 28, 2012.    . Fibrocystic breast disease   . Hepatic cyst   . Hypertension   . IBS (irritable bowel syndrome)   . Impaired glucose tolerance   . Menopause      Allergies  Allergen Reactions  . Norvasc [Amlodipine Besylate] Other (See Comments)    Reaction:  Fatigue   . Penicillins Hives and Other (See Comments)    Has patient had a PCN reaction causing immediate rash, facial/tongue/throat swelling, SOB or lightheadedness with hypotension: Yes Has patient had a PCN reaction causing severe rash involving mucus membranes or skin necrosis: No Has patient had a PCN reaction that required hospitalization No Has patient had a PCN reaction occurring within the last 10 years: No If all of the above answers are "NO", then may proceed with Cephalosporin use.   . Advair Diskus [Fluticasone-Salmeterol] Palpitations  . Metformin And Related Other (See Comments)    Reaction:  GI upset      Current  Outpatient Prescriptions  Medication Sig Dispense Refill  . acetaminophen (TYLENOL) 500 MG tablet Take 1,000 mg by mouth every 6 (six) hours as needed for mild pain, moderate pain or headache.     . Cholecalciferol (VITAMIN D3) 5000 units CAPS Take 5,000 Units by mouth daily.    [name] dicyclomine (BENTYL) 20 MG tablet Take 1 tablet (20 mg total) by mouth 3 (three) times daily as needed for spasms. 45 tablet 3  . diphenhydrAMINE (BENADRYL) 25 mg capsule Take 25 mg by mouth every 6 (six) hours as needed for itching or allergies.     [name] glipiZIDE (GLUCOTROL) 5 MG tablet Take 0.5 tablets (2.5 mg total) by mouth daily before breakfast. 45 tablet 1  . hydrochlorothiazide (HYDRODIURIL) 12.5 MG tablet Take 1 tablet (12.5 mg total) by mouth daily. 90 tablet 1  . linaclotide (LINZESS) 145 MCG CAPS capsule Take 145 mcg by mouth daily before breakfast.    . losartan (COZAAR) 50 MG tablet Take 1 tablet (50 mg total) by mouth daily. 90 tablet 0  . Multiple Vitamins-Minerals (MULTIVITAMIN WITH MINERALS) tablet Take 1 tablet by mouth daily.    . naproxen (NAPROSYN) 500 MG tablet Take 1 tablet (500 mg total) by mouth 2 (two) times daily with a meal. 30 tablet 1  . pantoprazole (PROTONIX) 40 MG tablet Take 1  tablet (40 mg total) by mouth daily. 30 tablet 3  . potassium chloride SA (K-DUR,KLOR-CON) 20 MEQ tablet Take 1 tablet (20 mEq total) by mouth daily. 10 tablet 0  . pravastatin (PRAVACHOL) 20 MG tablet Take 20 mg by mouth at bedtime.     No current facility-administered medications for this visit.      Past Surgical History:  Procedure Laterality Date  . ABDOMINAL HYSTERECTOMY    . BACK SURGERY    . BREAST SURGERY  right   cyst removed  . CAST APPLICATION  [id number]   Procedure: CAST APPLICATION;  Surgeon: Carole Civil, MD;  Location: [facility];  Service: Orthopedics;  Laterality: Right;  procedure room   . CHOLECYSTECTOMY    . COLONOSCOPY  12/04/2004   NUR:Few small diverticula at sigmoid colon/Small  cecal polyp ablated   . COLONOSCOPY N/A 09/24/2014   SLF: small internal hemorrhoids  . ESOPHAGOGASTRODUODENOSCOPY N/A 02/02/2016   Procedure: ESOPHAGOGASTRODUODENOSCOPY (EGD);  Surgeon: Danie Binder, MD;  Location: [facility];  Service: Endoscopy;  Laterality: N/A;  930   . KNEE ARTHROCENTESIS  left  . TUBAL LIGATION       Allergies  Allergen Reactions  . Norvasc [Amlodipine Besylate] Other (See Comments)    Reaction:  Fatigue   . Penicillins Hives and Other (See Comments)    Has patient had a PCN reaction causing immediate rash, facial/tongue/throat swelling, SOB or lightheadedness with hypotension: Yes Has patient had a PCN reaction causing severe rash involving mucus membranes or skin necrosis: No Has patient had a PCN reaction that required hospitalization No Has patient had a PCN reaction occurring within the last 10 years: No If all of the above answers are "NO", then may proceed with Cephalosporin use.   . Advair Diskus [Fluticasone-Salmeterol] Palpitations  . Metformin And Related Other (See Comments)    Reaction:  GI upset       Family History  Problem Relation Age of Onset  . Stroke Mother   . Heart attack Mother   . Cancer Sister   . Diabetes Sister   . Seizures Brother   . Diabetes Brother   . Heart disease Brother   . Colon cancer Neg Hx      Social History Ms. Rossin reports that she has never smoked. She has never used smokeless tobacco. Ms. Bastedo reports that she does not drink alcohol.   Review of Systems CONSTITUTIONAL: No weight loss, fever, chills, weakness or fatigue.  HEENT: Eyes: No visual loss, blurred vision, double vision or yellow sclerae.No hearing loss, sneezing, congestion, runny nose or sore throat.  SKIN: No rash or itching.  CARDIOVASCULAR: per hpi RESPIRATORY: No shortness of breath, cough or sputum.  GASTROINTESTINAL: No anorexia, nausea, vomiting or diarrhea. No abdominal pain or blood.  GENITOURINARY: No burning on  urination, no polyuria NEUROLOGICAL: No headache, dizziness, syncope, paralysis, ataxia, numbness or tingling in the extremities. No change in bowel or bladder control.  MUSCULOSKELETAL: No muscle, back pain, joint pain or stiffness.  LYMPHATICS: No enlarged nodes. No history of splenectomy.  PSYCHIATRIC: No history of depression or anxiety.  ENDOCRINOLOGIC: No reports of sweating, cold or heat intolerance. No polyuria or polydipsia.  [name]   Physical Examination Vitals:   08/06/16 1325  BP: 130/66  Pulse: 84   Vitals:   08/06/16 1325  Weight: 245 lb (111.1 kg)  Height: 5\' 4"  (1.626 m)    Gen: resting comfortably, no acute distress HEENT: no scleral icterus, pupils equal round and  reactive, no palptable cervical adenopathy,  CV: RRR, no m/r/g, no jvd Resp: Clear to auscultation bilaterally GI: abdomen is soft, non-tender, non-distended, normal bowel sounds, no hepatosplenomegaly MSK: extremities are warm, no edema.  Skin: warm, no rash Neuro:  no focal deficits Psych: appropriate affect    Assessment and Plan  1. Chest pain - multiple CAD rsk factors including DM2. We will plan for an exercise nuclear stress   F/u pending stress results   Arnoldo Lenis, M.D.

## 2016-08-10 ENCOUNTER — Encounter (HOSPITAL_COMMUNITY): Payer: Commercial Managed Care - HMO

## 2016-08-11 ENCOUNTER — Encounter (HOSPITAL_COMMUNITY): Payer: Self-pay

## 2016-08-11 ENCOUNTER — Other Ambulatory Visit: Payer: Self-pay | Admitting: Pharmacist

## 2016-08-11 ENCOUNTER — Ambulatory Visit: Payer: Commercial Managed Care - HMO | Admitting: Family Medicine

## 2016-08-11 ENCOUNTER — Encounter (HOSPITAL_COMMUNITY)
Admission: RE | Admit: 2016-08-11 | Discharge: 2016-08-11 | Disposition: A | Discharge status: Home / Self Care | Payer: Commercial Managed Care - HMO | Source: Ambulatory Visit | Attending: Cardiology | Admitting: Cardiology

## 2016-08-11 ENCOUNTER — Inpatient Hospital Stay (HOSPITAL_COMMUNITY): Admission: RE | Admit: 2016-08-11 | Payer: Commercial Managed Care - HMO | Source: Ambulatory Visit

## 2016-08-11 DIAGNOSIS — R079 Chest pain, unspecified: Secondary | ICD-10-CM | POA: Insufficient documentation

## 2016-08-11 MED ORDER — REGADENOSON 0.4 MG/5ML IV SOLN
INTRAVENOUS | Status: AC
  Administered 2016-08-11: 0.4 mg via INTRAVENOUS
  Filled 2016-08-11: qty 5

## 2016-08-11 MED ORDER — SODIUM CHLORIDE 0.9% FLUSH
INTRAVENOUS | Status: AC
  Administered 2016-08-11: 10 mL via INTRAVENOUS
  Filled 2016-08-11: qty 10

## 2016-08-11 MED ORDER — TECHNETIUM TC 99M TETROFOSMIN IV KIT
30.0000 | PACK | Freq: Once | INTRAVENOUS | Status: AC | PRN
  Administered 2016-08-11: 30.1 via INTRAVENOUS

## 2016-08-11 NOTE — Patient Outreach (Signed)
Outreach call to [name] regarding her request for follow up from the [facility] Medication Adherence Campaign. Left a HIPAA compliant message on the patient's voicemail.  Harlow Asa, PharmD, [facility] [phone]

## 2016-08-12 ENCOUNTER — Encounter (HOSPITAL_COMMUNITY)
Admission: RE | Admit: 2016-08-12 | Discharge: 2016-08-12 | Disposition: A | Discharge status: Home / Self Care | Payer: Commercial Managed Care - HMO | Source: Ambulatory Visit | Attending: Cardiology | Admitting: Cardiology

## 2016-08-12 ENCOUNTER — Ambulatory Visit: Payer: Commercial Managed Care - HMO | Admitting: Family Medicine

## 2016-08-12 DIAGNOSIS — R079 Chest pain, unspecified: Secondary | ICD-10-CM | POA: Diagnosis not present

## 2016-08-12 LAB — NM MYOCAR MULTI W/SPECT W/WALL MOTION / EF
CHL CUP NUCLEAR SDS: 4
CHL CUP NUCLEAR SRS: 1
CHL CUP NUCLEAR SSS: 5
CHL CUP RESTING HR STRESS: 63 {beats}/min
LHR: 0
LV dias vol: 48 mL (ref 46–106)
LV sys vol: 12 mL
NUC STRESS TID: 0.76
Peak HR: 95 {beats}/min

## 2016-08-12 MED ORDER — TECHNETIUM TC 99M TETROFOSMIN IV KIT
25.0000 | PACK | Freq: Once | INTRAVENOUS | Status: AC | PRN
  Administered 2016-08-12: 25.3 via INTRAVENOUS

## 2016-08-27 ENCOUNTER — Encounter: Payer: Self-pay | Admitting: Family Medicine

## 2016-08-27 ENCOUNTER — Ambulatory Visit (INDEPENDENT_AMBULATORY_CARE_PROVIDER_SITE_OTHER): Payer: Commercial Managed Care - HMO | Admitting: Family Medicine

## 2016-08-27 VITALS — BP 122/82 | Ht 64.0 in | Wt 247.2 lb

## 2016-08-27 DIAGNOSIS — E876 Hypokalemia: Secondary | ICD-10-CM

## 2016-08-27 DIAGNOSIS — K5732 Diverticulitis of large intestine without perforation or abscess without bleeding: Secondary | ICD-10-CM | POA: Diagnosis not present

## 2016-08-27 MED ORDER — CIPROFLOXACIN HCL 500 MG PO TABS
500.0000 mg | ORAL_TABLET | Freq: Two times a day (BID) | ORAL | 0 refills | Status: DC
Start: 2016-08-27 — End: 2016-11-11

## 2016-08-27 MED ORDER — METRONIDAZOLE 500 MG PO TABS: 500.0000 mg | ORAL_TABLET | Freq: Two times a day (BID) | ORAL | 0 refills | Status: DC

## 2016-08-27 NOTE — Progress Notes (Signed)
   Subjective:    Patient ID: [name], female    DOB: [birth year], 70 y.o.   MRN: [id number]  HPI Patient arrives for a follow up on recent ER visit for chest pain.  Patient was seen by cardioloy worked up with a stress test hich was negative Patient states she was advised her potassium was low and given potassium but is currently out of med. Patient did have low potassium.   Patient also states her stomach is bothering her this am  patient relates lower abdominal pain discomfort on the left lower abdomen denies high fever chills vomiting diarrhea bloody sools.  Review of Systems  see above. Some lower abdomina pain no vomitng ordiarrhea. No fever or chills no chest congestion    Objective:   Physical Exam  lungs are clear hearts regular abdomen soft with some lower abdominal tenderness       Assessment & Plan:   history low potassium repeat lab work may need to be on ongoing potassium    probable iverticulitis antibiotics pescribed warnings discussed follow-up if probles

## 2016-08-28 LAB — BASIC METABOLIC PANEL
BUN/Creatinine Ratio: 23 (ref 12–28)
BUN: 19 mg/dL (ref 8–27)
CO2: 29 mmol/L (ref 18–29)
Calcium: 9.6 mg/dL (ref 8.7–10.3)
Chloride: 100 mmol/L (ref 96–106)
Creatinine, Ser: 0.84 mg/dL (ref 0.57–1.00)
GFR calc Af Amer: 83 mL/min/{1.73_m2} (ref >59)
GFR, EST NON AFRICAN AMERICAN: 72 mL/min/{1.73_m2} (ref >59)
GLUCOSE: 95 mg/dL (ref 65–99)
POTASSIUM: 4.7 mmol/L (ref 3.5–5.2)
SODIUM: 142 mmol/L (ref 134–144)

## 2016-08-28 LAB — HEMOGLOBIN A1C
ESTIMATED AVERAGE GLUCOSE: 148 mg/dL
HEMOGLOBIN A1C: 6.8 % — AB (ref 4.8–5.6)

## 2016-08-28 LAB — CBC WITH DIFFERENTIAL/PLATELET
BASOS: 0 %
Basophils Absolute: 0 10*3/uL (ref 0.0–0.2)
EOS (ABSOLUTE): 0.2 10*3/uL (ref 0.0–0.4)
Eos: 2 %
Hematocrit: 41.3 % (ref 34.0–46.6)
Hemoglobin: 13 g/dL (ref 11.1–15.9)
IMMATURE GRANULOCYTES: 0 %
Immature Grans (Abs): 0 10*3/uL (ref 0.0–0.1)
Lymphocytes Absolute: 2.9 10*3/uL (ref 0.7–3.1)
Lymphs: 39 %
MCH: 24.4 pg — ABNORMAL LOW (ref 26.6–33.0)
MCHC: 31.5 g/dL (ref 31.5–35.7)
MCV: 78 fL — AB (ref 79–97)
MONOS ABS: 0.6 10*3/uL (ref 0.1–0.9)
Monocytes: 8 %
NEUTROS PCT: 51 %
Neutrophils Absolute: 3.8 10*3/uL (ref 1.4–7.0)
PLATELETS: 322 10*3/uL (ref 150–379)
RBC: 5.32 x10E6/uL — ABNORMAL HIGH (ref 3.77–5.28)
RDW: 16.9 % — AB (ref 12.3–15.4)
WBC: 7.4 10*3/uL (ref 3.4–10.8)

## 2016-08-28 LAB — MAGNESIUM: MAGNESIUM: 2.2 mg/dL (ref 1.6–2.3)

## 2016-08-31 ENCOUNTER — Ambulatory Visit: Payer: Commercial Managed Care - HMO | Admitting: Internal Medicine

## 2016-09-08 ENCOUNTER — Ambulatory Visit: Payer: Commercial Managed Care - HMO | Admitting: Nutrition

## 2016-09-15 ENCOUNTER — Other Ambulatory Visit: Payer: Self-pay | Admitting: Family Medicine

## 2016-09-17 NOTE — Telephone Encounter (Signed)
Patient has been seen within the past 6 months, however, HTN not addressed. Would you like to refill?

## 2016-09-23 ENCOUNTER — Ambulatory Visit: Payer: Commercial Managed Care - HMO | Admitting: Family Medicine

## 2016-11-10 ENCOUNTER — Encounter: Payer: Self-pay | Admitting: Cardiology

## 2016-11-10 ENCOUNTER — Ambulatory Visit (INDEPENDENT_AMBULATORY_CARE_PROVIDER_SITE_OTHER): Payer: Medicare HMO | Admitting: Cardiology

## 2016-11-10 ENCOUNTER — Other Ambulatory Visit (HOSPITAL_COMMUNITY)
Admission: RE | Admit: 2016-11-10 | Discharge: 2016-11-10 | Disposition: A | Discharge status: Home / Self Care | Payer: Commercial Managed Care - HMO | Source: Ambulatory Visit | Attending: Cardiology | Admitting: Cardiology

## 2016-11-10 VITALS — BP 148/66 | HR 65 | Ht 66.0 in | Wt 247.0 lb

## 2016-11-10 DIAGNOSIS — I10 Essential (primary) hypertension: Secondary | ICD-10-CM

## 2016-11-10 DIAGNOSIS — E782 Mixed hyperlipidemia: Secondary | ICD-10-CM | POA: Diagnosis not present

## 2016-11-10 DIAGNOSIS — Z79899 Other long term (current) drug therapy: Secondary | ICD-10-CM | POA: Diagnosis not present

## 2016-11-10 DIAGNOSIS — R079 Chest pain, unspecified: Secondary | ICD-10-CM

## 2016-11-10 DIAGNOSIS — E119 Type 2 diabetes mellitus without complications: Secondary | ICD-10-CM

## 2016-11-10 LAB — BASIC METABOLIC PANEL
Anion gap: 6 (ref 5–15)
BUN: 19 mg/dL (ref 6–20)
CHLORIDE: 102 mmol/L (ref 101–111)
CO2: 31 mmol/L (ref 22–32)
CREATININE: 0.82 mg/dL (ref 0.44–1.00)
Calcium: 9.4 mg/dL (ref 8.9–10.3)
GFR calc Af Amer: >60 mL/min (ref >60)
GFR calc non Af Amer: >60 mL/min (ref >60)
Glucose, Bld: 132 mg/dL — ABNORMAL HIGH (ref 65–99)
POTASSIUM: 3.9 mmol/L (ref 3.5–5.1)
SODIUM: 139 mmol/L (ref 135–145)

## 2016-11-10 LAB — MAGNESIUM: MAGNESIUM: 2 mg/dL (ref 1.7–2.4)

## 2016-11-10 MED ORDER — CLOPIDOGREL BISULFATE 75 MG PO TABS: 75.0000 mg | ORAL_TABLET | Freq: Every day | ORAL | 3 refills | Status: DC

## 2016-11-10 NOTE — Patient Instructions (Signed)
Medication Instructions:  START PLAVIX 75 MG ONCE DAILY   Labwork: Your physician recommends that you return for lab work in: [place]    Testing/Procedures: NONE  Follow-Up: Your physician recommends that you schedule a follow-up appointment in: 3 MONTHS    Any Other Special Instructions Will Be Listed Below (If Applicable).     If you need a refill on your cardiac medications before your next appointment, please call your pharmacy.

## 2016-11-10 NOTE — Progress Notes (Signed)
Clinical Summary Ms. Zech is a 71 y.o.female seen today for follow up of the following medical problems.   1. Chest pain  - long history of chest pain - caths in 2005 and 2012 without significant disease - nuclear stress 07/2016 without ischemia - she reports asked not to take aspirin in the past by GI.    - still with some occasional chest pain at times. Pain not as severe, less frequent. Often occurs with laying down.   2.  DM2 - on oral agents. Last HgbA1c 07/2016 was 6.8  3. Hyperlipidemia - stomach troubles on lipitor, tolerating pravastatin.  - 12/2015 TC 164 TG 93 HDL 52 LDL 93 Past Medical History:  Diagnosis Date  . Blood clot of artery under arm ([place])   . Cyst on liver  . Depression   . Diabetes mellitus    controlled by diet  . Diverticulitis   . DVT of axillary vein, acute right ([place]) 11/24/2012   Dx on Jan 28, 2012.    . Fibrocystic breast disease   . Hepatic cyst   . Hypertension   . IBS (irritable bowel syndrome)   . Impaired glucose tolerance   . Menopause      Allergies  Allergen Reactions  . Norvasc [Amlodipine Besylate] Other (See Comments)    Reaction:  Fatigue   . Penicillins Hives and Other (See Comments)    Has patient had a PCN reaction causing immediate rash, facial/tongue/throat swelling, SOB or lightheadedness with hypotension: Yes Has patient had a PCN reaction causing severe rash involving mucus membranes or skin necrosis: No Has patient had a PCN reaction that required hospitalization No Has patient had a PCN reaction occurring within the last 10 years: No If all of the above answers are "NO", then may proceed with Cephalosporin use.   . Advair Diskus [Fluticasone-Salmeterol] Palpitations  . Metformin And Related Other (See Comments)    Reaction:  GI upset      Current Outpatient Prescriptions  Medication Sig Dispense Refill  . acetaminophen (TYLENOL) 500 MG tablet Take 1,000 mg by mouth every 6 (six) hours as needed  for mild pain, moderate pain or headache.     . calcium carbonate (OS-CAL - DOSED IN MG OF ELEMENTAL CALCIUM) 1250 (500 Ca) MG tablet Take 1 tablet by mouth.    . Cholecalciferol (VITAMIN D3) 5000 units CAPS Take 5,000 Units by mouth daily.    . ciprofloxacin (CIPRO) 500 MG tablet Take 1 tablet (500 mg total) by mouth 2 (two) times daily. 20 tablet 0  . dicyclomine (BENTYL) 20 MG tablet Take 1 tablet (20 mg total) by mouth 3 (three) times daily as needed for spasms. 45 tablet 3  . diphenhydrAMINE (BENADRYL) 25 mg capsule Take 25 mg by mouth every 6 (six) hours as needed for itching or allergies.     [name] glipiZIDE (GLUCOTROL) 5 MG tablet Take 0.5 tablets (2.5 mg total) by mouth daily before breakfast. 45 tablet 1  . hydrochlorothiazide (HYDRODIURIL) 12.5 MG tablet Take 1 tablet (12.5 mg total) by mouth daily. 90 tablet 1  . linaclotide (LINZESS) 145 MCG CAPS capsule Take 145 mcg by mouth daily before breakfast.    . losartan (COZAAR) 50 MG tablet TAKE 1 TABLET EVERY DAY 90 tablet 0  . metroNIDAZOLE (FLAGYL) 500 MG tablet Take 1 tablet (500 mg total) by mouth 2 (two) times daily with a meal. 14 tablet 0  . Multiple Vitamins-Minerals (MULTIVITAMIN WITH MINERALS) tablet Take 1 tablet by  mouth daily.    . naproxen (NAPROSYN) 500 MG tablet Take 1 tablet (500 mg total) by mouth 2 (two) times daily with a meal. 30 tablet 1  . pantoprazole (PROTONIX) 40 MG tablet Take 1 tablet (40 mg total) by mouth daily. 30 tablet 3  . potassium chloride SA (K-DUR,KLOR-CON) 20 MEQ tablet Take 1 tablet (20 mEq total) by mouth daily. 10 tablet 0  . pravastatin (PRAVACHOL) 20 MG tablet Take 20 mg by mouth at bedtime.     No current facility-administered medications for this visit.      Past Surgical History:  Procedure Laterality Date  . ABDOMINAL HYSTERECTOMY    . BACK SURGERY    . BREAST SURGERY  right   cyst removed  . CAST APPLICATION  1/61/0960   Procedure: CAST APPLICATION;  Surgeon: Carole Civil, MD;   Location: [facility];  Service: Orthopedics;  Laterality: Right;  procedure room   . CHOLECYSTECTOMY    . COLONOSCOPY  12/04/2004   NUR:Few small diverticula at sigmoid colon/Small cecal polyp ablated   . COLONOSCOPY N/A 09/24/2014   SLF: small internal hemorrhoids  . ESOPHAGOGASTRODUODENOSCOPY N/A 02/02/2016   Procedure: ESOPHAGOGASTRODUODENOSCOPY (EGD);  Surgeon: Danie Binder, MD;  Location: [facility];  Service: Endoscopy;  Laterality: N/A;  930   . KNEE ARTHROCENTESIS  left  . TUBAL LIGATION       Allergies  Allergen Reactions  . Norvasc [Amlodipine Besylate] Other (See Comments)    Reaction:  Fatigue   . Penicillins Hives and Other (See Comments)    Has patient had a PCN reaction causing immediate rash, facial/tongue/throat swelling, SOB or lightheadedness with hypotension: Yes Has patient had a PCN reaction causing severe rash involving mucus membranes or skin necrosis: No Has patient had a PCN reaction that required hospitalization No Has patient had a PCN reaction occurring within the last 10 years: No If all of the above answers are "NO", then may proceed with Cephalosporin use.   . Advair Diskus [Fluticasone-Salmeterol] Palpitations  . Metformin And Related Other (See Comments)    Reaction:  GI upset       Family History  Problem Relation Age of Onset  . Stroke Mother   . Heart attack Mother   . Cancer Sister   . Diabetes Sister   . Seizures Brother   . Diabetes Brother   . Heart disease Brother   . Colon cancer Neg Hx      Social History Ms. Resh reports that she has never smoked. She has never used smokeless tobacco. Ms. Vermeer reports that she does not drink alcohol.   Review of Systems CONSTITUTIONAL: No weight loss, fever, chills, weakness or fatigue.  HEENT: Eyes: No visual loss, blurred vision, double vision or yellow sclerae.No hearing loss, sneezing, congestion, runny nose or sore throat.  SKIN: No rash or itching.  CARDIOVASCULAR: per  hPI RESPIRATORY: No shortness of breath, cough or sputum.  GASTROINTESTINAL: No anorexia, nausea, vomiting or diarrhea. No abdominal pain or blood.  GENITOURINARY: No burning on urination, no polyuria NEUROLOGICAL: No headache, dizziness, syncope, paralysis, ataxia, numbness or tingling in the extremities. No change in bowel or bladder control.  MUSCULOSKELETAL: No muscle, back pain, joint pain or stiffness.  LYMPHATICS: No enlarged nodes. No history of splenectomy.  PSYCHIATRIC: No history of depression or anxiety.  ENDOCRINOLOGIC: No reports of sweating, cold or heat intolerance. No polyuria or polydipsia.  [name]   Physical Examination Vitals:   11/10/16 1031  BP: (!) 148/66  Pulse: 65   Vitals:   11/10/16 1031  Weight: 247 lb (112 kg)  Height: 5\' 6"  (1.676 m)    Gen: resting comfortably, no acute distress HEENT: no scleral icterus, pupils equal round and reactive, no palptable cervical adenopathy,  CV: RRR, no m/r/g, no jvd Resp: Clear to auscultation bilaterally GI: abdomen is soft, non-tender, non-distended, normal bowel sounds, no hepatosplenomegaly MSK: extremities are warm, no edema.  Skin: warm, no rash Neuro:  no focal deficits Psych: appropriate affect   Diagnostic Studies  07/2016 Nuclear stress test  There was no ST segment deviation noted during stress.  The study is normal. No ischemia or infarction.  This is a low risk study.  Nuclear stress EF: 75%.    05/2011 cath ANGIOGRAPHIC FINDINGS: 1. The left main coronary artery was a short segment and had no     disease. 2. The left anterior descending was a large vessel that coursed to the     apex and gave off a moderate-sized diagonal Carleena Mires.  The midportion     of the left anterior descending artery had a mild 20% stenosis.     The diagonal Kyrie Fludd was free of any disease. 3. Circumflex artery is a moderate-sized vessel that gives off a     moderate-sized bifurcating obtuse marginal Tierney Behl.  This vessel  is     free of any disease. 4. The right coronary artery is a moderate-sized dominant vessel with     no evidence of disease. 5. Left ventricular angiogram was performed in the RAO projection and     it showed normal left ventricular systolic function with ejection     fraction of 65-70%.  IMPRESSION: 1. Mild nonobstructive coronary artery disease. 2. Normal left ventricular systolic function. 3. Noncardiac chest pain.   06/2004 cath Left main coronary artery was normal.   The left anterior descending artery was normal.   Circumflex coronary artery is normal.   Right coronary artery was somewhat small but dominant.  It was normal.   Right ventriculography:  The right ventriculography showed hyperdynamic LV  function, ejection fraction of 65%.  There was no gradient across the aortic  valve and no MR.  The aortic pressure was 129/66, LV pressure is 130/90.   IMPRESSION:  The patient has no evidence of significant coronary artery  disease on RAO ventriculogram.  The ascending aorta and descending thoracic  aorta looked fine with no evidence of dissection.  Her chest pain would  appear to be noncardiac in etiology.  She will be discharged later today to  follow up with her primary care M.D.   The patient tolerated the procedure well.   Assessment and Plan  1. Chest pain - long history, negative workup in the past as outlined above - current atypical symptoms, no further workup at this time  2. DM2 - at goal - does not tolerate aspirin. In setting of DM2 and multiple CAD risk factors start plavix 75mg  daily for secondary prevention  3. Hyperlipidemia - did not tolerate high dose statin, tolerating pravastatin - continue current meds  4. HTN - elevated in clinic, she has not taken meds yet today. Continue to monitor - check BMET on ARB and diuretic   F/u 3 months      Arnoldo Lenis, M.D.

## 2016-11-11 ENCOUNTER — Telehealth: Payer: Self-pay | Admitting: Family Medicine

## 2016-11-11 ENCOUNTER — Other Ambulatory Visit: Payer: Self-pay | Admitting: Family Medicine

## 2016-11-11 NOTE — Telephone Encounter (Signed)
Her last A1c was at the end of December showed fairly good control. I would recommend the patient follow-up somewhere in late April through mid-May in order to check A1c. If A1c's are under good control then follow-ups are typically every 4-6 months-thank you

## 2016-11-11 NOTE — Telephone Encounter (Signed)
Patient calling to see when her next appt is.  Nothing on the schedule and last visit said follow up if problems.  She said she is suppose to come in every 3 mos to have bloodwork? Patient had some labs done yesterday for Cardiologist.  She is confused on when she should come in and why.  She will be out of town the next few days but said if we will leave a message on her machine she will call back and make an appt.

## 2016-11-12 NOTE — Telephone Encounter (Signed)
Left message to return call 

## 2016-11-12 NOTE — Telephone Encounter (Signed)
Advised patient Dr Nicki Reaper recommends that since her last A1c was at the end of December and showed fairly good control- recommend the patient follow-up somewhere in late April through mid-May in order to check A1c. If A1c's are under good control then follow-ups are typically every 4-6 months -Patient verbalized understanding.

## 2016-11-17 ENCOUNTER — Other Ambulatory Visit: Payer: Self-pay | Admitting: Family Medicine

## 2016-11-18 ENCOUNTER — Other Ambulatory Visit: Payer: Self-pay | Admitting: Family Medicine

## 2016-11-18 DIAGNOSIS — Z1231 Encounter for screening mammogram for malignant neoplasm of breast: Secondary | ICD-10-CM

## 2016-11-25 ENCOUNTER — Telehealth: Payer: Self-pay

## 2016-11-25 NOTE — Telephone Encounter (Signed)
I have put this in a letter and faxed it to the insurance company.

## 2016-11-25 NOTE — Telephone Encounter (Signed)
From what I can tell, only miralax is tier 2. Formulary given to EG.

## 2016-11-25 NOTE — Telephone Encounter (Signed)
Tell the insurance she has tried and failed: Miralax, Dulcolax, fiber supplement, and Amitiza.  Miralax and fiber are fiber and stool softener additives.  Dulcolax is a stimulant laxative.  Amitiza is a local GI tract chloride channel activator which increases intraluminal fluid and fecal transit.  Linzess increases

## 2016-11-25 NOTE — Telephone Encounter (Signed)
I am trying to do a tier exception for this pt in order for her to get her linzess at a lower cost.  They are wanting to know by today, why the medications on a lower tier would not be as effective in treating the patients condition. They want clinical rationale for expected differences.   Pt has tried and failed: miralax, dulcolax, amitiza and fiber.

## 2016-11-25 NOTE — Telephone Encounter (Signed)
What is lower in tier for her? She seems to have tried everything else.Marland KitchenMarland Kitchen

## 2016-11-25 NOTE — Telephone Encounter (Signed)
Tell the insurance she has tried and failed: Miralax, Dulcolax, fiber supplement, and Amitiza.  Miralax and fiber are fiber and stool softener additives.  Dulcolax is a stimulant laxative.  Amitiza is a local GI tract chloride channel activator which increases intraluminal fluid and fecal transit.  Linzess increases chloride and bicarb intraluminal secretion as well as increasing concentrations of cGMP which can help with visceral pain in addition to promoting more effective evacuation.  Perhaps the BEST clinical indication for the use of Linzess is....nothing else helped the patient and Linzess did.

## 2016-12-09 ENCOUNTER — Ambulatory Visit (HOSPITAL_COMMUNITY): Payer: Medicare HMO

## 2016-12-10 ENCOUNTER — Ambulatory Visit (HOSPITAL_COMMUNITY): Payer: Medicare HMO

## 2016-12-14 ENCOUNTER — Encounter: Payer: Self-pay | Admitting: Nurse Practitioner

## 2016-12-14 ENCOUNTER — Ambulatory Visit (INDEPENDENT_AMBULATORY_CARE_PROVIDER_SITE_OTHER): Payer: Medicare HMO | Admitting: Nurse Practitioner

## 2016-12-14 VITALS — BP 145/69 | HR 61 | Temp 97.8°F | Ht 66.0 in | Wt 246.8 lb

## 2016-12-14 DIAGNOSIS — K589 Irritable bowel syndrome without diarrhea: Secondary | ICD-10-CM | POA: Diagnosis not present

## 2016-12-14 NOTE — Patient Instructions (Signed)
1. Keep taking Linzess. 2. We will give you some samples to help you 'stretch your supply" 3. Bring her to not her by our office so we can call the insurance company. 4. Return for follow-up in 3 months. 5. Call us if you have any worsening symptoms before then.

## 2016-12-14 NOTE — Assessment & Plan Note (Signed)
The patient has irritable bowel syndrome constipation type. She has done quite well on Linzess whereas she has failed other medications as per history of present illness. Her insurance is now saying they will not pay for Linzess. It appears we have requested an appeal for her Linzess to be covered. We will follow-up on this as other medications have not worked effectively for her. Recommended she continue Linzess, samples were provided to help bridge her supply. Return for follow-up in 3 months. Call with any worsening problems before then.

## 2016-12-14 NOTE — Progress Notes (Signed)
Referring Provider: [name], MD Primary Care Physician:  [name], MD Primary GI:  Dr. Oneida Alar   Chief Complaint  Patient presents with  . Abdominal Pain  . Constipation    HPI:   [name] is a 71 y.o. female who presents for follow-up on constipation and abdominal pain. She was last seen in our office 06/15/2016 for dyspepsia, IBS, diverticulitis. At that time it was noted an upper endoscopy was completed 01/04/2016 which found normal esophagus, mild gastritis status post biopsy, normal duodenum. Recommended low-fat diet, trigger avoidance, continue Protonix once a day. 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 No H. Pylori.  Recent hospitalization 05/04/2016 through 05/06/2016 for diverticulitis improved on Cipro and Flagyl. At the time of her last visit she was feeling better. Attempting weight loss but without success. Constipation and GERD well-controlled with medications at that time. No other GI symptoms.  Today she states she's doing well overall. Linzess works very well for her. She has tried other medications through samples or OTC medications including Miralax, Colace, Amitiza, and others. They were either ineffective or not covered. On Linzess has rare abdominal pain "nothing like it as. Has a bowel movement daily on Linzess; if not on Linzess with go days without a bowel movement. Has resorted to taking Linzess "sparingly" to help it last because insurance is saying they wont cover it. Denies hematochezia, melena, N/V, acute changes in bowel habits. Denies chest pain, dyspnea, dizziness, lightheadedness, syncope, near syncope. Denies any other upper or lower GI symptoms.  Past Medical History:  Diagnosis Date  . Blood clot of artery under arm ([place])   . Cyst on liver  . Depression   . Diabetes mellitus    controlled by diet  . Diverticulitis   . DVT of axillary vein,  acute right ([place]) 11/24/2012   Dx on Jan 28, 2012.    . Fibrocystic breast disease   . Hepatic cyst   . Hypertension   . IBS (irritable bowel syndrome)   . Impaired glucose tolerance   . Menopause     Past Surgical History:  Procedure Laterality Date  . ABDOMINAL HYSTERECTOMY    . BACK SURGERY    . BREAST SURGERY  right   cyst removed  . CAST APPLICATION  4/74/2595   Procedure: CAST APPLICATION;  Surgeon: Carole Civil, MD;  Location: [facility];  Service: Orthopedics;  Laterality: Right;  procedure room   . CHOLECYSTECTOMY    . COLONOSCOPY  12/04/2004   NUR:Few small diverticula at sigmoid colon/Small cecal polyp ablated   . COLONOSCOPY N/A 09/24/2014   SLF: small internal hemorrhoids  . ESOPHAGOGASTRODUODENOSCOPY N/A 02/02/2016   Procedure: ESOPHAGOGASTRODUODENOSCOPY (EGD);  Surgeon: Danie Binder, MD;  Location: [facility];  Service: Endoscopy;  Laterality: N/A;  930   . KNEE ARTHROCENTESIS  left  . TUBAL LIGATION      Current Outpatient Prescriptions  Medication Sig Dispense Refill  . acetaminophen (TYLENOL) 500 MG tablet Take 1,000 mg by mouth every 6 (six) hours as needed for mild pain, moderate pain or headache.     . calcium carbonate (OS-CAL - DOSED IN MG OF ELEMENTAL CALCIUM) 1250 (500 Ca) MG tablet Take 1 tablet by mouth.    . Cholecalciferol (VITAMIN D3) 5000 units CAPS Take 5,000 Units by mouth daily.    . clopidogrel (PLAVIX) 75 MG tablet Take 1 tablet (75 mg total) by mouth  daily. 90 tablet 3  . dicyclomine (BENTYL) 20 MG tablet Take 1 tablet (20 mg total) by mouth 3 (three) times daily as needed for spasms. 45 tablet 3  . diphenhydrAMINE (BENADRYL) 25 mg capsule Take 25 mg by mouth every 6 (six) hours as needed for itching or allergies.     [name] glipiZIDE (GLUCOTROL) 5 MG tablet Take 0.5 tablets (2.5 mg total) by mouth daily before breakfast. 45 tablet 1  . hydrochlorothiazide (HYDRODIURIL) 12.5 MG tablet Take 1 tablet (12.5 mg total) by mouth daily. 90 tablet 1    . linaclotide (LINZESS) 145 MCG CAPS capsule Take 145 mcg by mouth daily before breakfast.    . losartan (COZAAR) 50 MG tablet TAKE 1 TABLET EVERY DAY 90 tablet 0  . Multiple Vitamins-Minerals (MULTIVITAMIN WITH MINERALS) tablet Take 1 tablet by mouth daily.    . naproxen (NAPROSYN) 500 MG tablet Take 1 tablet (500 mg total) by mouth 2 (two) times daily with a meal. 30 tablet 1  . pantoprazole (PROTONIX) 40 MG tablet Take 1 tablet (40 mg total) by mouth daily. 30 tablet 3  . potassium chloride SA (K-DUR,KLOR-CON) 20 MEQ tablet Take 1 tablet (20 mEq total) by mouth daily. 10 tablet 0  . pravastatin (PRAVACHOL) 20 MG tablet Take 20 mg by mouth at bedtime.     No current facility-administered medications for this visit.     Allergies as of 12/14/2016 - Review Complete 12/14/2016  Allergen Reaction Noted  . Norvasc [amlodipine besylate] Other (See Comments) 11/21/2012  . Penicillins Hives and Other (See Comments)   . Advair diskus [fluticasone-salmeterol] Palpitations 11/21/2012  . Metformin and related Other (See Comments) 09/28/2013    Family History  Problem Relation Age of Onset  . Stroke Mother   . Heart attack Mother   . Cancer Sister   . Diabetes Sister   . Seizures Brother   . Diabetes Brother   . Heart disease Brother   . Colon cancer Neg Hx     Social History   Social History  . Marital status: Married    Spouse name: N/A  . Number of children: N/A  . Years of education: N/A   Occupational History  . Retired    Social History Main Topics  . Smoking status: Never Smoker  . Smokeless tobacco: Never Used     Comment: Never smoked  . Alcohol use No  . Drug use: No  . Sexual activity: Yes   Other Topics Concern  . None   Social History Narrative  . None    Review of Systems: Complete ROS negative except as per HPI.   Physical Exam: BP (!) 145/69   Pulse 61   Temp 97.8 F (36.6 C) (Oral)   Ht 5\' 6"  (1.676 m)   Wt 246 lb 12.8 oz (111.9 kg)   BMI  39.83 kg/m  General:   Alert and oriented. Pleasant and cooperative. Well-nourished and well-developed.  Head:  Normocephalic and atraumatic. Eyes:  Without icterus, sclera clear and conjunctiva pink.  Ears:  Normal auditory acuity. Mouth:  No deformity or lesions, oral mucosa pink.  Throat/Neck:  Supple, without mass or thyromegaly. Cardiovascular:  S1, S2 present without murmurs appreciated. Normal pulses noted. Extremities without clubbing or edema. Respiratory:  Clear to auscultation bilaterally. No wheezes, rales, or rhonchi. No distress.  Gastrointestinal:  +BS, soft, non-tender and non-distended. No HSM noted. No guarding or rebound. No masses appreciated.  Rectal:  Deferred  Musculoskalatal:  Symmetrical without gross deformities.  Normal posture. Skin:  Intact without significant lesions or rashes. Neurologic:  Alert and oriented x4;  grossly normal neurologically. Psych:  Alert and cooperative. Normal mood and affect. Heme/Lymph/Immune: No significant cervical adenopathy. No excessive bruising noted.    12/14/2016 8:48 AM   Disclaimer: This note was dictated with voice recognition software. Similar sounding words can inadvertently be transcribed and may not be corrected upon review.

## 2016-12-16 ENCOUNTER — Ambulatory Visit (HOSPITAL_COMMUNITY)
Admission: RE | Admit: 2016-12-16 | Discharge: 2016-12-16 | Disposition: A | Discharge status: Home / Self Care | Payer: Medicare HMO | Source: Ambulatory Visit | Attending: Family Medicine | Admitting: Family Medicine

## 2016-12-16 DIAGNOSIS — Z1231 Encounter for screening mammogram for malignant neoplasm of breast: Secondary | ICD-10-CM | POA: Diagnosis not present

## 2016-12-16 NOTE — Telephone Encounter (Signed)
Pt is aware. She gets linzess by [employer] order. She will call them and ask them to send it to her.

## 2016-12-16 NOTE — Telephone Encounter (Signed)
Had to do an appeal with [employer]. linzess has been approved at a tier 2 copay. Tried to call and inform pt- NA-LMOM with information. Told her to call her pharmacy and ask them to fill it for her.

## 2017-01-04 ENCOUNTER — Ambulatory Visit (HOSPITAL_COMMUNITY): Payer: Commercial Managed Care - HMO

## 2017-01-05 ENCOUNTER — Ambulatory Visit: Payer: Commercial Managed Care - HMO | Admitting: Family Medicine

## 2017-01-14 ENCOUNTER — Other Ambulatory Visit: Payer: Self-pay | Admitting: Gastroenterology

## 2017-02-10 ENCOUNTER — Ambulatory Visit (INDEPENDENT_AMBULATORY_CARE_PROVIDER_SITE_OTHER): Payer: Medicare HMO | Admitting: Cardiology

## 2017-02-10 ENCOUNTER — Encounter: Payer: Self-pay | Admitting: Cardiology

## 2017-02-10 VITALS — BP 128/70 | HR 52 | Ht 65.0 in | Wt 250.0 lb

## 2017-02-10 DIAGNOSIS — I10 Essential (primary) hypertension: Secondary | ICD-10-CM

## 2017-02-10 DIAGNOSIS — R0789 Other chest pain: Secondary | ICD-10-CM | POA: Diagnosis not present

## 2017-02-10 DIAGNOSIS — E782 Mixed hyperlipidemia: Secondary | ICD-10-CM | POA: Diagnosis not present

## 2017-02-10 NOTE — Progress Notes (Signed)
Clinical Summary Ms. Beane is a 71 y.o.female seen today for follow up of the following medical problems.   1. Chest pain  - long history of chest pain - caths in 2005 and 2012 without significant disease - nuclear stress 07/2016 without ischemia - she reports asked not to take aspirin in the past by GI.    - still with chest pain at times. Burning like feeling. Feels like reflux to her  - walking regularly, up to 30 minutes. Endurance improving.   2. Hyperlipidemia - stomach troubles on lipitor, tolerating pravastatin.  - 12/2015 TC 164 TG 93 HDL 52 LDL 93  3. HTN - she remains compliant with meds.     SH: oldest son has CKD, pending dialysis. 1 yo sister she helps take care of.  Past Medical History:  Diagnosis Date  . Blood clot of artery under arm ([place])   . Cyst on liver  . Depression   . Diabetes mellitus    controlled by diet  . Diverticulitis   . DVT of axillary vein, acute right ([place]) 11/24/2012   Dx on Jan 28, 2012.    . Fibrocystic breast disease   . Hepatic cyst   . Hypertension   . IBS (irritable bowel syndrome)   . Impaired glucose tolerance   . Menopause      Allergies  Allergen Reactions  . Norvasc [Amlodipine Besylate] Other (See Comments)    Reaction:  Fatigue   . Penicillins Hives and Other (See Comments)    Has patient had a PCN reaction causing immediate rash, facial/tongue/throat swelling, SOB or lightheadedness with hypotension: Yes Has patient had a PCN reaction causing severe rash involving mucus membranes or skin necrosis: No Has patient had a PCN reaction that required hospitalization No Has patient had a PCN reaction occurring within the last 10 years: No If all of the above answers are "NO", then may proceed with Cephalosporin use.   . Advair Diskus [Fluticasone-Salmeterol] Palpitations  . Metformin And Related Other (See Comments)    Reaction:  GI upset      Current Outpatient Prescriptions  Medication Sig  Dispense Refill  . acetaminophen (TYLENOL) 500 MG tablet Take 1,000 mg by mouth every 6 (six) hours as needed for mild pain, moderate pain or headache.     . calcium carbonate (OS-CAL - DOSED IN MG OF ELEMENTAL CALCIUM) 1250 (500 Ca) MG tablet Take 1 tablet by mouth.    . Cholecalciferol (VITAMIN D3) 5000 units CAPS Take 5,000 Units by mouth daily.    . clopidogrel (PLAVIX) 75 MG tablet Take 1 tablet (75 mg total) by mouth daily. 90 tablet 3  . dicyclomine (BENTYL) 20 MG tablet Take 1 tablet (20 mg total) by mouth 3 (three) times daily as needed for spasms. 45 tablet 3  . diphenhydrAMINE (BENADRYL) 25 mg capsule Take 25 mg by mouth every 6 (six) hours as needed for itching or allergies.     [name] glipiZIDE (GLUCOTROL) 5 MG tablet Take 0.5 tablets (2.5 mg total) by mouth daily before breakfast. 45 tablet 1  . hydrochlorothiazide (HYDRODIURIL) 12.5 MG tablet Take 1 tablet (12.5 mg total) by mouth daily. 90 tablet 1  . linaclotide (LINZESS) 145 MCG CAPS capsule Take 145 mcg by mouth daily before breakfast.    . losartan (COZAAR) 50 MG tablet TAKE 1 TABLET EVERY DAY 90 tablet 0  . Multiple Vitamins-Minerals (MULTIVITAMIN WITH MINERALS) tablet Take 1 tablet by mouth daily.    . naproxen (  NAPROSYN) 500 MG tablet Take 1 tablet (500 mg total) by mouth 2 (two) times daily with a meal. 30 tablet 1  . pantoprazole (PROTONIX) 40 MG tablet TAKE ONE TABLET BY MOUTH ONCE DAILY 30 tablet 3  . potassium chloride SA (K-DUR,KLOR-CON) 20 MEQ tablet Take 1 tablet (20 mEq total) by mouth daily. 10 tablet 0  . pravastatin (PRAVACHOL) 20 MG tablet Take 20 mg by mouth at bedtime.     No current facility-administered medications for this visit.      Past Surgical History:  Procedure Laterality Date  . ABDOMINAL HYSTERECTOMY    . BACK SURGERY    . BREAST SURGERY  right   cyst removed  . CAST APPLICATION  2/53/6644   Procedure: CAST APPLICATION;  Surgeon: Carole Civil, MD;  Location: [facility];  Service: Orthopedics;   Laterality: Right;  procedure room   . CHOLECYSTECTOMY    . COLONOSCOPY  12/04/2004   NUR:Few small diverticula at sigmoid colon/Small cecal polyp ablated   . COLONOSCOPY N/A 09/24/2014   SLF: small internal hemorrhoids  . ESOPHAGOGASTRODUODENOSCOPY N/A 02/02/2016   Procedure: ESOPHAGOGASTRODUODENOSCOPY (EGD);  Surgeon: Danie Binder, MD;  Location: [facility];  Service: Endoscopy;  Laterality: N/A;  930   . KNEE ARTHROCENTESIS  left  . TUBAL LIGATION       Allergies  Allergen Reactions  . Norvasc [Amlodipine Besylate] Other (See Comments)    Reaction:  Fatigue   . Penicillins Hives and Other (See Comments)    Has patient had a PCN reaction causing immediate rash, facial/tongue/throat swelling, SOB or lightheadedness with hypotension: Yes Has patient had a PCN reaction causing severe rash involving mucus membranes or skin necrosis: No Has patient had a PCN reaction that required hospitalization No Has patient had a PCN reaction occurring within the last 10 years: No If all of the above answers are "NO", then may proceed with Cephalosporin use.   . Advair Diskus [Fluticasone-Salmeterol] Palpitations  . Metformin And Related Other (See Comments)    Reaction:  GI upset       Family History  Problem Relation Age of Onset  . Stroke Mother   . Heart attack Mother   . Cancer Sister   . Diabetes Sister   . Seizures Brother   . Diabetes Brother   . Heart disease Brother   . Colon cancer Neg Hx      Social History Ms. Esh reports that she has never smoked. She has never used smokeless tobacco. Ms. Payer reports that she does not drink alcohol.   Review of Systems CONSTITUTIONAL: No weight loss, fever, chills, weakness or fatigue.  HEENT: Eyes: No visual loss, blurred vision, double vision or yellow sclerae.No hearing loss, sneezing, congestion, runny nose or sore throat.  SKIN: No rash or itching.  CARDIOVASCULAR: per hpi RESPIRATORY: No shortness of breath,  cough or sputum.  GASTROINTESTINAL: No anorexia, nausea, vomiting or diarrhea. No abdominal pain or blood.  GENITOURINARY: No burning on urination, no polyuria NEUROLOGICAL: No headache, dizziness, syncope, paralysis, ataxia, numbness or tingling in the extremities. No change in bowel or bladder control.  MUSCULOSKELETAL: No muscle, back pain, joint pain or stiffness.  LYMPHATICS: No enlarged nodes. No history of splenectomy.  PSYCHIATRIC: No history of depression or anxiety.  ENDOCRINOLOGIC: No reports of sweating, cold or heat intolerance. No polyuria or polydipsia.  [name]   Physical Examination Vitals:   02/10/17 0953  BP: 128/70  Pulse: (!) 52   Vitals:   02/10/17  3825  Weight: 250 lb (113.4 kg)  Height: 5\' 5"  (1.651 m)    Gen: resting comfortably, no acute distress HEENT: no scleral icterus, pupils equal round and reactive, no palptable cervical adenopathy,  CV: RRR, no m/r/g, no jvd Resp: Clear to auscultation bilaterally GI: abdomen is soft, non-tender, non-distended, normal bowel sounds, no hepatosplenomegaly MSK: extremities are warm, no edema.  Skin: warm, no rash Neuro:  no focal deficits Psych: appropriate affect   Diagnostic Studies 07/2016 Nuclear stress test  There was no ST segment deviation noted during stress.  The study is normal. No ischemia or infarction.  This is a low risk study.  Nuclear stress EF: 75%.   05/2011 cath ANGIOGRAPHIC FINDINGS: 1. The left main coronary artery was a short segment and had no disease. 2. The left anterior descending was a large vessel that coursed to the apex and gave off a moderate-sized diagonal Chellie Vanlue. The midportion of the left anterior descending artery had a mild 20% stenosis. The diagonal Oluwatomisin Hustead was free of any disease. 3. Circumflex artery is a moderate-sized vessel that gives off a moderate-sized bifurcating obtuse marginal Kolbe Delmonaco. This vessel is free of any disease. 4. The  right coronary artery is a moderate-sized dominant vessel with no evidence of disease. 5. Left ventricular angiogram was performed in the RAO projection and it showed normal left ventricular systolic function with ejection fraction of 65-70%.  IMPRESSION: 1. Mild nonobstructive coronary artery disease. 2. Normal left ventricular systolic function. 3. Noncardiac chest pain.   06/2004 cath Left main coronary artery was normal.  The left anterior descending artery was normal.  Circumflex coronary artery is normal.  Right coronary artery was somewhat small but dominant. It was normal.  Right ventriculography: The right ventriculography showed hyperdynamic LV function, ejection fraction of 65%. There was no gradient across the aortic valve and no MR. The aortic pressure was 129/66, LV pressure is 130/90.  IMPRESSION: The patient has no evidence of significant coronary artery disease on RAO ventriculogram. The ascending aorta and descending thoracic aorta looked fine with no evidence of dissection. Her chest pain would appear to be noncardiac in etiology. She will be discharged later today to follow up with her primary care M.D.  The patient tolerated the procedure well.     Assessment and Plan  1. Chest pain - long history, negative workup in the past as outlined above - atypical symptoms recently, no further workup at this time.   2. Hyperlipidemia - did not tolerate high dose statin, tolerating pravastatin - she will continue current therapy.   3. HTN - bp is at goal, continue current meds   F/u 6 months      Arnoldo Lenis, M.D.

## 2017-02-10 NOTE — Patient Instructions (Signed)

## 2017-02-22 ENCOUNTER — Encounter: Payer: Self-pay | Admitting: Family Medicine

## 2017-02-22 ENCOUNTER — Ambulatory Visit (INDEPENDENT_AMBULATORY_CARE_PROVIDER_SITE_OTHER): Payer: Medicare HMO | Admitting: Family Medicine

## 2017-02-22 VITALS — BP 142/80 | Ht 65.0 in | Wt 250.0 lb

## 2017-02-22 DIAGNOSIS — E119 Type 2 diabetes mellitus without complications: Secondary | ICD-10-CM

## 2017-02-22 DIAGNOSIS — M1991 Primary osteoarthritis, unspecified site: Secondary | ICD-10-CM

## 2017-02-22 DIAGNOSIS — Z79899 Other long term (current) drug therapy: Secondary | ICD-10-CM

## 2017-02-22 DIAGNOSIS — D509 Iron deficiency anemia, unspecified: Secondary | ICD-10-CM | POA: Diagnosis not present

## 2017-02-22 DIAGNOSIS — E1140 Type 2 diabetes mellitus with diabetic neuropathy, unspecified: Secondary | ICD-10-CM | POA: Diagnosis not present

## 2017-02-22 DIAGNOSIS — I10 Essential (primary) hypertension: Secondary | ICD-10-CM

## 2017-02-22 DIAGNOSIS — E7800 Pure hypercholesterolemia, unspecified: Secondary | ICD-10-CM

## 2017-02-22 LAB — POCT GLYCOSYLATED HEMOGLOBIN (HGB A1C): HEMOGLOBIN A1C: 6.1

## 2017-02-22 MED ORDER — PANTOPRAZOLE SODIUM 40 MG PO TBEC: 40.0000 mg | DELAYED_RELEASE_TABLET | Freq: Every day | ORAL | 1 refills | Status: DC

## 2017-02-22 MED ORDER — ACARBOSE 25 MG PO TABS: 25.0000 mg | ORAL_TABLET | Freq: Three times a day (TID) | ORAL | 1 refills | Status: DC

## 2017-02-22 NOTE — Progress Notes (Signed)
   Subjective:    Patient ID: [name], female    DOB: [birth year], 71 y.o.   MRN: [id number]  Diabetes  She presents for her follow-up diabetic visit. She has type 2 diabetes mellitus. Pertinent negatives for hypoglycemia include no confusion. Pertinent negatives for diabetes include no chest pain, no fatigue, no polydipsia, no polyphagia and no weakness. Compliance with diabetes treatment: takes meds everyday but not at the same time every day. Home blood sugar record trend: sugars are up and down. She does not see a podiatrist.Eye exam current: due for visit this month.   A1C 6.1.  Bilateral knee pain. Relates knee stiffness been going on for years seems to be getting worse patient suffers with significant obesity  Acid reflux. Has been out of refills of protonix. States it helped when she was taking med. She would like to restart medication she thought it was doing her good  Morbid obesity patient is tried various ways to lose weight having difficult time doing so Tingling in hands and feet. She relates tingling in her toes more on right foot and left foot also some in the fingers this been going on for several weeks-at times she gets burning in the right foot  Denies being depressed-she states stressed a lot but denies being depressed  Fatigued-stays fatigued a lot denies snoring denies difficulty breathing states energy level fair   Review of Systems  Constitutional: Negative for activity change, appetite change and fatigue.  HENT: Negative for congestion.   Respiratory: Negative for cough.   Cardiovascular: Negative for chest pain.  Gastrointestinal: Negative for abdominal pain.  Endocrine: Negative for polydipsia and polyphagia.  Neurological: Negative for weakness.  Psychiatric/Behavioral: Negative for confusion.       Objective:   Physical Exam  Constitutional: She appears well-nourished. No distress.  Cardiovascular: Normal rate, regular rhythm and normal heart  sounds.   No murmur heard. Pulmonary/Chest: Effort normal and breath sounds normal. No respiratory distress.  Musculoskeletal: She exhibits no edema.  Lymphadenopathy:    She has no cervical adenopathy.  Neurological: She is alert. She exhibits normal muscle tone.  Psychiatric: Her behavior is normal.  Vitals reviewed.   25 minutes was spent with the patient. Greater than half the time was spent in discussion and answering questions and counseling regarding the issues that the patient came in for today.       Assessment & Plan:  Bilateral knee pain osteoarthritis Tylenol when necessary try to lose weight  Diabetes A1c to good control low sugars occurring we will change medication stop glipizide for now  Hyperlipidemia continue lab work previous labs review Abs order  Peripheral neuropathy more than likely related to diabetes check B12 level as well patient unsure if gabapentin would be beneficial.  Reflux renewal of protonic's she ran out a little while back having a lot of heartburn issues she saw cardiology who felt heart was doing well  Fatigue tiredness I think is more age-related and deconditioning she denies sleep apnea symptoms but we will have refill out questionnaires on depression and sleep apnea she denies being depressed  Morbid obesity patient tries watch diet cut back and trying to stay active  Patient having low sugar spells intermittently because of glipizide even though it's at the lowest dose I recommend switching this to Precose 25 mg with meals patient will follow-up in approximate 4 months will check A1c

## 2017-02-22 NOTE — Progress Notes (Signed)
Left message return call 02/22/17

## 2017-02-23 LAB — MICROALBUMIN / CREATININE URINE RATIO
CREATININE, UR: 238.4 mg/dL
MICROALBUM., U, RANDOM: 12.7 ug/mL
Microalb/Creat Ratio: 5.3 mg/g creat (ref 0.0–30.0)

## 2017-02-23 LAB — CBC WITH DIFFERENTIAL/PLATELET
BASOS: 1 %
Basophils Absolute: 0 10*3/uL (ref 0.0–0.2)
EOS (ABSOLUTE): 0.1 10*3/uL (ref 0.0–0.4)
EOS: 2 %
Hematocrit: 41.3 % (ref 34.0–46.6)
Hemoglobin: 13.1 g/dL (ref 11.1–15.9)
IMMATURE GRANS (ABS): 0 10*3/uL (ref 0.0–0.1)
IMMATURE GRANULOCYTES: 0 %
LYMPHS: 57 %
Lymphocytes Absolute: 3.3 10*3/uL — ABNORMAL HIGH (ref 0.7–3.1)
MCH: 24.3 pg — ABNORMAL LOW (ref 26.6–33.0)
MCHC: 31.7 g/dL (ref 31.5–35.7)
MCV: 77 fL — AB (ref 79–97)
Monocytes Absolute: 0.4 10*3/uL (ref 0.1–0.9)
Monocytes: 6 %
NEUTROS PCT: 34 %
Neutrophils Absolute: 1.9 10*3/uL (ref 1.4–7.0)
PLATELETS: 332 10*3/uL (ref 150–379)
RBC: 5.39 x10E6/uL — AB (ref 3.77–5.28)
RDW: 16.3 % — ABNORMAL HIGH (ref 12.3–15.4)
WBC: 5.8 10*3/uL (ref 3.4–10.8)

## 2017-02-23 LAB — HEPATIC FUNCTION PANEL
ALK PHOS: 84 IU/L (ref 39–117)
ALT: 16 IU/L (ref 0–32)
AST: 22 IU/L (ref 0–40)
Albumin: 4.7 g/dL (ref 3.6–4.8)
BILIRUBIN, DIRECT: 0.15 mg/dL (ref 0.00–0.40)
Bilirubin Total: 0.4 mg/dL (ref 0.0–1.2)
TOTAL PROTEIN: 7.3 g/dL (ref 6.0–8.5)

## 2017-02-23 LAB — FERRITIN: Ferritin: 86 ng/mL (ref 15–150)

## 2017-02-23 LAB — LIPID PANEL
Chol/HDL Ratio: 2.6 ratio (ref 0.0–4.4)
Cholesterol, Total: 127 mg/dL (ref 100–199)
HDL: 48 mg/dL (ref >39)
LDL Calculated: 62 mg/dL (ref 0–99)
TRIGLYCERIDES: 85 mg/dL (ref 0–149)
VLDL CHOLESTEROL CAL: 17 mg/dL (ref 5–40)

## 2017-02-23 LAB — VITAMIN B12: VITAMIN B 12: 532 pg/mL (ref 232–1245)

## 2017-02-28 ENCOUNTER — Ambulatory Visit (HOSPITAL_COMMUNITY)
Admission: RE | Admit: 2017-02-28 | Discharge: 2017-02-28 | Disposition: A | Discharge status: Home / Self Care | Payer: Medicare HMO | Source: Ambulatory Visit | Attending: Family Medicine | Admitting: Family Medicine

## 2017-02-28 ENCOUNTER — Encounter: Payer: Self-pay | Admitting: Family Medicine

## 2017-02-28 ENCOUNTER — Ambulatory Visit (INDEPENDENT_AMBULATORY_CARE_PROVIDER_SITE_OTHER): Payer: Medicare HMO | Admitting: Family Medicine

## 2017-02-28 VITALS — BP 148/76 | Ht 65.0 in | Wt 250.0 lb

## 2017-02-28 DIAGNOSIS — G4739 Other sleep apnea: Secondary | ICD-10-CM | POA: Diagnosis not present

## 2017-02-28 DIAGNOSIS — X58XXXA Exposure to other specified factors, initial encounter: Secondary | ICD-10-CM | POA: Insufficient documentation

## 2017-02-28 DIAGNOSIS — S99912A Unspecified injury of left ankle, initial encounter: Secondary | ICD-10-CM | POA: Diagnosis not present

## 2017-02-28 DIAGNOSIS — S93492A Sprain of other ligament of left ankle, initial encounter: Secondary | ICD-10-CM | POA: Diagnosis not present

## 2017-02-28 MED ORDER — HYDROCODONE-ACETAMINOPHEN 5-325 MG PO TABS: 1.0000 | ORAL_TABLET | Freq: Four times a day (QID) | ORAL | 0 refills | Status: DC | PRN

## 2017-02-28 NOTE — Progress Notes (Signed)
   Subjective:    Patient ID: [name], female    DOB: 1946-07-28, 71 y.o.   MRN: [id number]  Ankle Injury   The incident occurred 12 to 24 hours ago. The injury mechanism was a twisting injury. The pain is present in the left ankle. She has tried ice, NSAIDs and rest for the symptoms.   Patient states she was getting out of car stepped on uneven surface in the parking lot of the business stated had a hole in there it caused her ankle to twist causing pain discomfort she fell again send in joining car she is had pain and discomfort increasing ever since difficulty walking She denies hip pain knee pain back pain  Review of Systems   Objective:   Physical Exam   swelling in the lateral ankle tenderness with range of motion calf normal Achilles normal strength somewhat impaired because of discomfort      Assessment & Plan:  Ankle pain discomfort sprain-x-rays ordered await the results Anti-inflammatory over the next week or 2 If progressive troubles or worse follow-up  X-ray was negative exercises sent to the patient follow-up if ongoing troubles or not doing well within the next 2-3 weeks   On a previous visit the patient was concerned about fatigue and tiredness and difficulty sleeping and snoring her [place] questionnaire is positive this patient does need a sleep study. Face-to-face evaluation was done regarding this she does have the symptoms of sleep apnea including daytime sleepiness difficulty staying awake trouble with fatigue and tiredness in the morning

## 2017-03-15 ENCOUNTER — Ambulatory Visit: Payer: Medicare HMO | Admitting: Nurse Practitioner

## 2017-03-15 ENCOUNTER — Ambulatory Visit: Payer: Medicare HMO | Admitting: Gastroenterology

## 2017-03-30 ENCOUNTER — Encounter (HOSPITAL_BASED_OUTPATIENT_CLINIC_OR_DEPARTMENT_OTHER): Payer: Self-pay

## 2017-03-30 DIAGNOSIS — G4710 Hypersomnia, unspecified: Secondary | ICD-10-CM

## 2017-03-30 DIAGNOSIS — R5383 Other fatigue: Secondary | ICD-10-CM

## 2017-03-30 DIAGNOSIS — G4700 Insomnia, unspecified: Secondary | ICD-10-CM

## 2017-04-01 NOTE — Telephone Encounter (Signed)
Noted. No further recommendations at this time. 

## 2017-04-11 ENCOUNTER — Telehealth: Payer: Self-pay | Admitting: Family Medicine

## 2017-04-11 NOTE — Telephone Encounter (Signed)
Patient said that she is currently set up to have a home sleep test done, but she would like to have this sleep study done in the hospital instead.  This is due to not being able to properly sleep at home.

## 2017-04-18 ENCOUNTER — Telehealth: Payer: Self-pay | Admitting: Family Medicine

## 2017-04-18 NOTE — Telephone Encounter (Signed)
In my opinion she should do the test, per insurance she will need to do a home study

## 2017-04-18 NOTE — Telephone Encounter (Signed)
Received a phone call from sleep studies stating that patient does not want to do the in home sleep study. Patient prefers to do the sleep study at the sleep study center. Sleep study center will need a new order. Please advise

## 2017-04-18 NOTE — Telephone Encounter (Signed)
His long as insurance covers it that would be fine please work with Izora Ribas regarding this issue thank you

## 2017-04-18 NOTE — Telephone Encounter (Signed)
Spoke with Izora Ribas and was informed that patient's insurance does not cover a in facility sleep study it only covers a at home sleep study. Informed patient and patient stated that she would like to hold off unless you feel it necessary because she is worried about how to set up equipment in her home. She was informed by the sleep studies department that she will have to pick up equipment and they will give her take home instructions. Please advise?

## 2017-04-19 ENCOUNTER — Ambulatory Visit: Payer: Medicare HMO | Attending: Family Medicine | Admitting: Neurology

## 2017-04-19 DIAGNOSIS — R5383 Other fatigue: Secondary | ICD-10-CM | POA: Diagnosis not present

## 2017-04-19 DIAGNOSIS — G4700 Insomnia, unspecified: Secondary | ICD-10-CM | POA: Insufficient documentation

## 2017-04-19 DIAGNOSIS — G4710 Hypersomnia, unspecified: Secondary | ICD-10-CM | POA: Insufficient documentation

## 2017-04-19 DIAGNOSIS — G4739 Other sleep apnea: Secondary | ICD-10-CM | POA: Diagnosis not present

## 2017-04-19 NOTE — Telephone Encounter (Signed)
Patient called back and stated that she found the number and she goes to pick up the supplies this evening.

## 2017-04-19 NOTE — Telephone Encounter (Signed)
Patient would like to still do the Home Study. She states she does not have the contact for sleep study.

## 2017-05-24 ENCOUNTER — Other Ambulatory Visit: Payer: Self-pay | Admitting: Family Medicine

## 2017-06-24 ENCOUNTER — Ambulatory Visit (INDEPENDENT_AMBULATORY_CARE_PROVIDER_SITE_OTHER): Payer: Medicare HMO | Admitting: Family Medicine

## 2017-06-24 ENCOUNTER — Encounter: Payer: Self-pay | Admitting: Family Medicine

## 2017-06-24 VITALS — BP 120/76

## 2017-06-24 DIAGNOSIS — R0789 Other chest pain: Secondary | ICD-10-CM

## 2017-06-24 DIAGNOSIS — E119 Type 2 diabetes mellitus without complications: Secondary | ICD-10-CM | POA: Diagnosis not present

## 2017-06-24 DIAGNOSIS — Z23 Encounter for immunization: Secondary | ICD-10-CM | POA: Diagnosis not present

## 2017-06-24 DIAGNOSIS — E785 Hyperlipidemia, unspecified: Secondary | ICD-10-CM | POA: Diagnosis not present

## 2017-06-24 DIAGNOSIS — I10 Essential (primary) hypertension: Secondary | ICD-10-CM

## 2017-06-24 LAB — POCT GLYCOSYLATED HEMOGLOBIN (HGB A1C): Hemoglobin A1C: 6.5

## 2017-06-24 MED ORDER — ACARBOSE 25 MG PO TABS: 25.0000 mg | ORAL_TABLET | Freq: Three times a day (TID) | ORAL | 1 refills | Status: DC

## 2017-06-24 MED ORDER — PRAVASTATIN SODIUM 20 MG PO TABS: 20.0000 mg | ORAL_TABLET | Freq: Every day | ORAL | 3 refills | Status: DC

## 2017-06-24 MED ORDER — LOSARTAN POTASSIUM 50 MG PO TABS: 50.0000 mg | ORAL_TABLET | Freq: Every day | ORAL | 3 refills | Status: DC

## 2017-06-24 MED ORDER — PANTOPRAZOLE SODIUM 40 MG PO TBEC: 40.0000 mg | DELAYED_RELEASE_TABLET | Freq: Every day | ORAL | 3 refills | Status: DC

## 2017-06-24 MED ORDER — HYDROCHLOROTHIAZIDE 12.5 MG PO TABS: 12.5000 mg | ORAL_TABLET | Freq: Every day | ORAL | 1 refills | Status: DC

## 2017-06-24 NOTE — Progress Notes (Signed)
   Subjective:    Patient ID: [name], female    DOB: Jan 16, 1946, 71 y.o.   MRN: [id number]  Diabetes  She presents for her follow-up diabetic visit. She has type 2 diabetes mellitus. Pertinent negatives for hypoglycemia include no confusion. Pertinent negatives for diabetes include no chest pain, no fatigue, no polydipsia, no polyphagia and no weakness. Home blood sugar record trend: 80's - 90's. She does not see a podiatrist.Eye exam is not current.   Results for orders placed or performed in visit on 06/24/17  POCT glycosylated hemoglobin (Hb A1C)  Result Value Ref Range   Hemoglobin A1C 6.5    Left breast pain since last week.  She relates significant discomfort in the left chest region she denies as chest pressure tightness or shortness of breath she relates it as being left breast pain she is up-to-date on mammogram.  bodyaches all over. Started 5 days ago.  She relates some soreness stiffness generalized and not severe been going on for a few days she denies any fever chills sweats.  Flu vaccine today.  She relates taking her blood pressure medicine on a regular basis relates compliance with diet trying to stay active  She states she takes her cholesterol medicine without trouble.  She has been on this for a significant length of time I doubt that is causing her muscle aches  She does use Linzess as needed to help her with constipation issues  Review of Systems  Constitutional: Negative for activity change, appetite change and fatigue.  HENT: Negative for congestion.   Respiratory: Negative for cough.   Cardiovascular: Negative for chest pain.  Gastrointestinal: Negative for abdominal pain.  Endocrine: Negative for polydipsia and polyphagia.  Skin: Negative for color change.  Neurological: Negative for weakness.  Psychiatric/Behavioral: Negative for confusion.       Objective:   Physical Exam  Constitutional: She appears well-developed and well-nourished. No  distress.  HENT:  Head: Normocephalic and atraumatic.  Eyes: Right eye exhibits no discharge. Left eye exhibits no discharge.  Neck: No tracheal deviation present.  Cardiovascular: Normal rate, regular rhythm and normal heart sounds.   No murmur heard. Pulmonary/Chest: Effort normal and breath sounds normal. No respiratory distress. She has no wheezes. She has no rales.  Musculoskeletal: She exhibits no edema.  Lymphadenopathy:    She has no cervical adenopathy.  Neurological: She is alert. She exhibits normal muscle tone.  Skin: Skin is warm and dry. No erythema.  Psychiatric: Her behavior is normal.  Vitals reviewed.  Breast exam normal bilateral pectoralis muscle on the left side tender to palpation no mass felt mammogram up-to-date  25 minutes was spent with the patient. Greater than half the time was spent in discussion and answering questions and counseling regarding the issues that the patient came in for today.      Assessment & Plan:  Diabetes very good control continue current measures watch diet closely  Minimal neuropathy right foot always do foot exam every evening  Constipation minimal currently uses medication sparingly  Blood pressure good control continue current measures watch diet closely  Morbid obesity patient was encouraged to try to lose weight  Chest wall discomfort left side musculoskeletal avoid heavy lifting Tylenol as needed  Hyperlipidemia continue cholesterol medicine on a regular basis  Follow-up with cardiology on a regular basis as well  Reflux under good control with medication  Recheck here in 5-6 months

## 2017-08-02 ENCOUNTER — Other Ambulatory Visit: Payer: Self-pay | Admitting: *Deleted

## 2017-08-02 ENCOUNTER — Telehealth: Payer: Self-pay | Admitting: Cardiology

## 2017-08-02 DIAGNOSIS — E119 Type 2 diabetes mellitus without complications: Secondary | ICD-10-CM | POA: Diagnosis not present

## 2017-08-02 DIAGNOSIS — Z79899 Other long term (current) drug therapy: Secondary | ICD-10-CM | POA: Diagnosis not present

## 2017-08-02 NOTE — Telephone Encounter (Signed)
Per phone call from pt--called stating she's having a hard time checking her blood sugar, states her blood is to thick to go on the strips, currently taking Plavix. Can be reached @ [phone]

## 2017-08-02 NOTE — Telephone Encounter (Signed)
Pt called pcp and he told her she needed an INR because her blood maybe too "thick" and to call us. I told her to run finger under running water before attempting a finger stick.

## 2017-08-02 NOTE — Telephone Encounter (Signed)
She will have to work this out with her pcp. Medically speaking there is not a condition where the blood gets to thick, there must be some technical issue with the fingerstick itself. Can try running her hand under warm water to help dilate the blood vessels further, but beyond that there is no testing to do   Zandra Abts MD

## 2017-08-03 LAB — CBC WITH DIFFERENTIAL/PLATELET
BASOS: 0 %
Basophils Absolute: 0 10*3/uL (ref 0.0–0.2)
EOS (ABSOLUTE): 0.1 10*3/uL (ref 0.0–0.4)
EOS: 2 %
Hematocrit: 41.7 % (ref 34.0–46.6)
Hemoglobin: 13.3 g/dL (ref 11.1–15.9)
IMMATURE GRANS (ABS): 0 10*3/uL (ref 0.0–0.1)
Immature Granulocytes: 0 %
LYMPHS ABS: 3.4 10*3/uL — AB (ref 0.7–3.1)
Lymphs: 56 %
MCH: 25 pg — AB (ref 26.6–33.0)
MCHC: 31.9 g/dL (ref 31.5–35.7)
MCV: 78 fL — ABNORMAL LOW (ref 79–97)
MONOCYTES: 6 %
MONOS ABS: 0.4 10*3/uL (ref 0.1–0.9)
Neutrophils Absolute: 2.3 10*3/uL (ref 1.4–7.0)
Neutrophils: 36 %
PLATELETS: 271 10*3/uL (ref 150–379)
RBC: 5.32 x10E6/uL — ABNORMAL HIGH (ref 3.77–5.28)
RDW: 16.6 % — ABNORMAL HIGH (ref 12.3–15.4)
WBC: 6.2 10*3/uL (ref 3.4–10.8)

## 2017-08-03 LAB — BASIC METABOLIC PANEL
BUN / CREAT RATIO: 23 (ref 12–28)
BUN: 18 mg/dL (ref 8–27)
CO2: 26 mmol/L (ref 20–29)
CREATININE: 0.79 mg/dL (ref 0.57–1.00)
Calcium: 9.5 mg/dL (ref 8.7–10.3)
Chloride: 102 mmol/L (ref 96–106)
GFR, EST AFRICAN AMERICAN: 88 mL/min/{1.73_m2} (ref >59)
GFR, EST NON AFRICAN AMERICAN: 77 mL/min/{1.73_m2} (ref >59)
Glucose: 104 mg/dL — ABNORMAL HIGH (ref 65–99)
Potassium: 4.9 mmol/L (ref 3.5–5.2)
SODIUM: 142 mmol/L (ref 134–144)

## 2017-08-03 LAB — PT AND PTT
INR: 1 (ref 0.8–1.2)
PROTHROMBIN TIME: 10.4 s (ref 9.1–12.0)
aPTT: 28 s (ref 24–33)

## 2017-08-04 NOTE — Telephone Encounter (Signed)
Message received by patient regarding Dr Nelly Laurence message.She said PCP did some type of blood work yesterday. She is going to run warm water on her finger before testing.

## 2017-08-10 ENCOUNTER — Other Ambulatory Visit: Payer: Self-pay | Admitting: Gastroenterology

## 2017-08-26 ENCOUNTER — Encounter: Payer: Self-pay | Admitting: Family Medicine

## 2017-08-26 ENCOUNTER — Ambulatory Visit (HOSPITAL_COMMUNITY)
Admission: RE | Admit: 2017-08-26 | Discharge: 2017-08-26 | Disposition: A | Discharge status: Home / Self Care | Payer: Medicare HMO | Source: Ambulatory Visit | Attending: Family Medicine | Admitting: Family Medicine

## 2017-08-26 ENCOUNTER — Ambulatory Visit: Payer: Medicare HMO | Admitting: Family Medicine

## 2017-08-26 VITALS — BP 132/86 | Ht 65.0 in | Wt 250.0 lb

## 2017-08-26 DIAGNOSIS — M7732 Calcaneal spur, left foot: Secondary | ICD-10-CM | POA: Diagnosis not present

## 2017-08-26 DIAGNOSIS — X58XXXA Exposure to other specified factors, initial encounter: Secondary | ICD-10-CM | POA: Insufficient documentation

## 2017-08-26 DIAGNOSIS — M25572 Pain in left ankle and joints of left foot: Secondary | ICD-10-CM | POA: Diagnosis not present

## 2017-08-26 DIAGNOSIS — S99912A Unspecified injury of left ankle, initial encounter: Secondary | ICD-10-CM | POA: Diagnosis not present

## 2017-08-26 DIAGNOSIS — M7989 Other specified soft tissue disorders: Secondary | ICD-10-CM | POA: Diagnosis not present

## 2017-08-26 DIAGNOSIS — S93402A Sprain of unspecified ligament of left ankle, initial encounter: Secondary | ICD-10-CM | POA: Insufficient documentation

## 2017-08-26 MED ORDER — HYDROCODONE-ACETAMINOPHEN 5-325 MG PO TABS: 1.0000 | ORAL_TABLET | ORAL | 0 refills | Status: DC | PRN

## 2017-08-26 NOTE — Progress Notes (Signed)
   Subjective:    Patient ID: [name], female    DOB: 02/02/1946, 71 y.o.   MRN: [id number]  HPI  Patient arrives with c/o left ankle pain and swelling.patient has history of broken ankle. She relates pain discomfort hurts to put pressure on denies any injury other than stepping out of a car and feeling a pop she's frustrated by what's going on he made benign has had previous fracture on that side Review of Systems Relates ankle pain denies knee pain hip pain denies abdominal pain    Objective:   Physical Exam Calf is normal knee is normal ankle moderate tenderness swelling is noted on the lateral aspect       Assessment & Plan:  Significant ankle sprain cool compresses keep elevated as directed x-rays today await the results hopefully not broken may need physical therapy depending on findings may need orthopedic consult

## 2017-09-01 ENCOUNTER — Telehealth: Payer: Self-pay

## 2017-09-01 NOTE — Telephone Encounter (Signed)
PA DONE FOR THE [facility] AND FAXED.

## 2017-09-02 ENCOUNTER — Telehealth: Payer: Self-pay | Admitting: Family Medicine

## 2017-09-02 MED ORDER — HYDROCHLOROTHIAZIDE 12.5 MG PO TABS: 12.5000 mg | ORAL_TABLET | Freq: Every day | ORAL | 1 refills | Status: DC

## 2017-09-02 NOTE — Telephone Encounter (Signed)
Prescription sent electronically to pharmacy. Patient notified. 

## 2017-09-02 NOTE — Telephone Encounter (Signed)
Pt is needing refills on hydrochlorothiazide (HYDRODIURIL) 12.5 MG tablet   [facility]

## 2017-09-05 ENCOUNTER — Ambulatory Visit (INDEPENDENT_AMBULATORY_CARE_PROVIDER_SITE_OTHER): Payer: Medicare HMO | Admitting: Nurse Practitioner

## 2017-09-05 ENCOUNTER — Encounter: Payer: Self-pay | Admitting: Nurse Practitioner

## 2017-09-05 VITALS — BP 146/80 | HR 62 | Temp 97.0°F | Ht 64.0 in | Wt 254.0 lb

## 2017-09-05 DIAGNOSIS — K2100 Gastro-esophageal reflux disease with esophagitis, without bleeding: Secondary | ICD-10-CM

## 2017-09-05 DIAGNOSIS — K219 Gastro-esophageal reflux disease without esophagitis: Secondary | ICD-10-CM | POA: Insufficient documentation

## 2017-09-05 DIAGNOSIS — K5900 Constipation, unspecified: Secondary | ICD-10-CM

## 2017-09-05 NOTE — Progress Notes (Signed)
Referring Provider: [name], MD Primary Care Physician:  [name], MD Primary GI:  Dr. Oneida Alar  Chief Complaint  Patient presents with  . Irritable Bowel Syndrome    f/u. C/o stomach fullness. Has not had Linzess x Nov 2018    HPI:   [name] is a 72 y.o. female who presents for follow-up on irritable bowel syndrome.  The patient was last seen in our office 12/14/2016.  At that time as noted history of GERD status post endoscopy in 2017 with mild gastritis and recommended continue Protonix daily.  Gastric biopsies without H. pylori.  Did have a bout of diverticulitis with hospital admission in September 2017 which improved with antibiotics.  Her last visit she was doing well and Linzess noted to be working very well for her.  She is previously tried and failed MiraLAX, Colace, Amitiza, and others.  On Linzess she had improvement in abdominal pain and bowel movement frequency as well as emptying.  When she is not taking Linzess she goes days without a bowel movement.  At her last visit she noted to be taking it sparingly because insurance was going to start denying it.  Recommended continue Linzess, provided some samples to help with her supply, recommended bringing in insurance denial letter so we could contact for an appeal, follow-up in 3 months.  A prior authorization was completed and faxed back to her insurance on September 01, 2017.  Today she states she's doing ok. Having persistent constipation off Linzess due to insurance coverage issues. Previously it worked very well for her.  Tried and failed MiraLAX, Colace, Amitiza, Metamucil, OTC laxative (only caused cramping). Has intermittent abdominal pain/fullness. GERD symptoms about 1-2 times a week on PPI (Protonix). Denies hematochezia, nmelena, fever, chills, unintentional weight loss. Denies chest pain, dyspnea, dizziness, lightheadedness, syncope, near syncope. Denies any other upper or lower GI  symptoms.  Past Medical History:  Diagnosis Date  . Blood clot of artery under arm ([place])   . Cyst on liver  . Depression   . Diabetes mellitus    controlled by diet  . Diverticulitis   . DVT of axillary vein, acute right ([place]) 11/24/2012   Dx on Jan 28, 2012.    . Fibrocystic breast disease   . Hepatic cyst   . Hypertension   . IBS (irritable bowel syndrome)   . Impaired glucose tolerance   . Menopause     Past Surgical History:  Procedure Laterality Date  . ABDOMINAL HYSTERECTOMY    . BACK SURGERY    . BREAST SURGERY  right   cyst removed  . CAST APPLICATION  12/07/7351   Procedure: CAST APPLICATION;  Surgeon: Carole Civil, MD;  Location: [facility];  Service: Orthopedics;  Laterality: Right;  procedure room   . CHOLECYSTECTOMY    . COLONOSCOPY  12/04/2004   NUR:Few small diverticula at sigmoid colon/Small cecal polyp ablated   . COLONOSCOPY N/A 09/24/2014   SLF: small internal hemorrhoids  . ESOPHAGOGASTRODUODENOSCOPY N/A 02/02/2016   Procedure: ESOPHAGOGASTRODUODENOSCOPY (EGD);  Surgeon: Danie Binder, MD;  Location: [facility] SUITE;  Service: Endoscopy;  Laterality: N/A;  930   . KNEE ARTHROCENTESIS  left  . TUBAL LIGATION      Current Outpatient Medications  Medication Sig Dispense Refill  . acarbose (PRECOSE) 25 MG tablet Take 1 tablet (25 mg total) by mouth 3 (three) times daily with meals. 270 tablet 1  . acetaminophen (TYLENOL) 500 MG tablet Take 1,000  mg by mouth every 6 (six) hours as needed for mild pain, moderate pain or headache.     . Cholecalciferol (VITAMIN D3) 5000 units CAPS Take 5,000 Units by mouth daily.    . clopidogrel (PLAVIX) 75 MG tablet Take 1 tablet (75 mg total) by mouth daily. 90 tablet 3  . hydrochlorothiazide (HYDRODIURIL) 12.5 MG tablet Take 1 tablet (12.5 mg total) by mouth daily. 90 tablet 1  . losartan (COZAAR) 50 MG tablet Take 1 tablet (50 mg total) by mouth daily. 90 tablet 3  . Multiple Vitamins-Minerals (MULTIVITAMIN WITH MINERALS)  tablet Take 1 tablet by mouth daily.    . pantoprazole (PROTONIX) 40 MG tablet Take 1 tablet (40 mg total) by mouth daily. 90 tablet 3  . pravastatin (PRAVACHOL) 20 MG tablet Take 1 tablet (20 mg total) by mouth at bedtime. 90 tablet 3  . TRUEPLUS LANCETS 33G MISC TEST EVERY DAY 100 each 5  . Alcohol Swabs (B-D SINGLE USE SWABS REGULAR) PADS USE EVERY DAY 100 each 5  . calcium carbonate (OS-CAL - DOSED IN MG OF ELEMENTAL CALCIUM) 1250 (500 Ca) MG tablet Take 1 tablet by mouth.    [name] LINZESS 145 MCG CAPS capsule TAKE 1 CAPSULE 30 MINUTES PRIOR TO YOUR FIRST MEAL (Patient not taking: Reported on 09/05/2017) 90 capsule 3   No current facility-administered medications for this visit.     Allergies as of 09/05/2017 - Review Complete 09/05/2017  Allergen Reaction Noted  . Norvasc [amlodipine besylate] Other (See Comments) 11/21/2012  . Penicillins Hives and Other (See Comments)   . Advair diskus [fluticasone-salmeterol] Palpitations 11/21/2012  . Metformin and related Other (See Comments) 09/28/2013    Family History  Problem Relation Age of Onset  . Stroke Mother   . Heart attack Mother   . Cancer Sister   . Diabetes Sister   . Seizures Brother   . Diabetes Brother   . Heart disease Brother   . Kidney disease Son   . Colon cancer Neg Hx     Social History   Socioeconomic History  . Marital status: Married    Spouse name: None  . Number of children: None  . Years of education: None  . Highest education level: None  Social Needs  . Financial resource strain: None  . Food insecurity - worry: None  . Food insecurity - inability: None  . Transportation needs - medical: None  . Transportation needs - non-medical: None  Occupational History  . Occupation: Retired  Tobacco Use  . Smoking status: Never Smoker  . Smokeless tobacco: Never Used  . Tobacco comment: Never smoked  Substance and Sexual Activity  . Alcohol use: No    Alcohol/week: 0.0 oz  . Drug use: No  . Sexual  activity: Yes  Other Topics Concern  . None  Social History Narrative  . None    Review of Systems: Complete ROS negative except as per HPI.   Physical Exam: BP (!) 146/80   Pulse 62   Temp (!) 97 F (36.1 C) (Oral)   Ht 5\' 4"  (1.626 m)   Wt 254 lb (115.2 kg)   BMI 43.60 kg/m  General:   Obese female. Alert and oriented. Pleasant and cooperative. Well-nourished and well-developed.  Eyes:  Without icterus, sclera clear and conjunctiva pink.  Ears:  Normal auditory acuity. Cardiovascular:  S1, S2 present without murmurs appreciated. Extremities without clubbing or edema. Respiratory:  Clear to auscultation bilaterally. No wheezes, rales, or rhonchi. No distress.  Gastrointestinal:  +BS, rounded but soft, non-tender and non-distended. No HSM noted. No guarding or rebound. No masses appreciated.  Rectal:  Deferred  Musculoskalatal:  Symmetrical without gross deformities. Neurologic:  Alert and oriented x4;  grossly normal neurologically. Psych:  Alert and cooperative. Normal mood and affect. Heme/Lymph/Immune: No excessive bruising noted.    09/05/2017 11:10 AM   Disclaimer: This note was dictated with voice recognition software. Similar sounding words can inadvertently be transcribed and may not be corrected upon review.

## 2017-09-05 NOTE — Assessment & Plan Note (Signed)
Persistent GERD symptoms.  Protonix is not working very well.  Upon further discussion she stated she was not taking it on an empty stomach.  I recommend she try taking it 30 minutes prior to breakfast, on an empty stomach and see if that improves her symptoms.  Return for follow-up in 3 months.

## 2017-09-05 NOTE — Patient Instructions (Signed)
1. Take Protonix once a day on an empty stomach, 30 minutes before your first meal the day. 2. I am giving you samples of Linzess to help your symptoms while we contact your insurance. 3. We will contact your insurance company and ask for a "tear exception."  This will allow Linzess to be covered at a better price.  We will let you know when we have heard back. 4. Return for follow-up in 3 months. 5. Call us if you have any questions or concerns.

## 2017-09-05 NOTE — Progress Notes (Signed)
CC'ED TO PCP 

## 2017-09-05 NOTE — Assessment & Plan Note (Signed)
3 of chronic constipation and has failed multiple medications.  Linzess 145 mcg daily works very well for her.  However, her insurance is only going to cover it at a tier 3 which would cost her several hundred dollars for a 76-month supply.  Because she has tried and failed essentially all other IBS/CIC medications we will attempt a tear exception.  I will give her samples in the meantime to help relieve her symptoms while we await insurance response.  Return for follow-up in 3 months.

## 2017-09-06 ENCOUNTER — Ambulatory Visit: Payer: Medicare HMO | Admitting: Nurse Practitioner

## 2017-09-07 NOTE — Telephone Encounter (Signed)
Tier exception completed and faxed for the [place].

## 2017-09-13 NOTE — Telephone Encounter (Signed)
We may be out of options. I guess we can contact the drug rep to see if they have any patient assistance programs. Short of that it doesn't sound like there's a cheaper Rx alternative (and she's already tried other options as well).  Lets check with the rep and update the patient.

## 2017-09-13 NOTE — Telephone Encounter (Signed)
I called pt to have her come by and pick up paperwork for the Patient Assistance for [place]. She said they called her this AM and said they made a mistake and she has been approved until 07/2018. I told her I have not gotten any notification on that. I am leaving her some samples of the Linzess 145 #15 and the paperwork in the event she will need it. She will pick up tomorrow.

## 2017-09-13 NOTE — Telephone Encounter (Signed)
The tier exception was denied. Since there isn't an alternative brand name drug in a lower cost-sharing tier, a tier exception is not permitted.  Randall Hiss, please advise!

## 2017-09-14 NOTE — Telephone Encounter (Signed)
Noted, thanks for the update 

## 2017-09-30 ENCOUNTER — Encounter: Payer: Self-pay | Admitting: Cardiology

## 2017-09-30 ENCOUNTER — Other Ambulatory Visit: Payer: Self-pay

## 2017-09-30 ENCOUNTER — Ambulatory Visit: Payer: Medicare HMO | Admitting: Cardiology

## 2017-09-30 VITALS — BP 160/84 | HR 62 | Ht 65.0 in | Wt 251.6 lb

## 2017-09-30 DIAGNOSIS — I10 Essential (primary) hypertension: Secondary | ICD-10-CM | POA: Diagnosis not present

## 2017-09-30 DIAGNOSIS — E782 Mixed hyperlipidemia: Secondary | ICD-10-CM | POA: Diagnosis not present

## 2017-09-30 DIAGNOSIS — R0789 Other chest pain: Secondary | ICD-10-CM | POA: Diagnosis not present

## 2017-09-30 DIAGNOSIS — Z79899 Other long term (current) drug therapy: Secondary | ICD-10-CM

## 2017-09-30 MED ORDER — LOSARTAN POTASSIUM 100 MG PO TABS: 100.0000 mg | ORAL_TABLET | Freq: Every day | ORAL | 3 refills | Status: DC

## 2017-09-30 NOTE — Patient Instructions (Signed)
Medication Instructions:  INCREASE LOSARTAN TO 100 MG DAILY   Labwork: 2 WEEKS  BMET  Testing/Procedures: NON  Follow-Up: Your physician wants you to follow-up in: 6 MONTHS.  You will receive a reminder letter in the mail two months in advance. If you don't receive a letter, please call our office to schedule the follow-up appointment.   Any Other Special Instructions Will Be Listed Below (If Applicable).     If you need a refill on your cardiac medications before your next appointment, please call your pharmacy.

## 2017-09-30 NOTE — Progress Notes (Signed)
Clinical Summary Ms. Clinkscale is a 72 y.o.female seen today for follow up of the following medical problems.   1. Chest pain - long history of chest pain - caths in 2005 and 2012 without significant disease - nuclear stress 07/2016 without ischemia - she reports asked not to take aspirin in the past by GI.    - still with pain at times. Fairly stable since last visit.   2. Hyperlipidemia - stomach troubles on lipitor, tolerating pravastatin.  - 01/2017 TC 127 TG 85 HDL 48 LDL 62  - compliant with meds  3. HTN - compliant with meds - reports increased stress recently as cause of her recent elevated bp's   SH: oldest son has CKD IV, pending dialysis. 64 yo sister she helps take care of.    Past Medical History:  Diagnosis Date  . Blood clot of artery under arm ([place])   . Cyst on liver  . Depression   . Diabetes mellitus    controlled by diet  . Diverticulitis   . DVT of axillary vein, acute right ([place]) 11/24/2012   Dx on Jan 28, 2012.    . Fibrocystic breast disease   . Hepatic cyst   . Hypertension   . IBS (irritable bowel syndrome)   . Impaired glucose tolerance   . Menopause      Allergies  Allergen Reactions  . Norvasc [Amlodipine Besylate] Other (See Comments)    Reaction:  Fatigue   . Penicillins Hives and Other (See Comments)    Has patient had a PCN reaction causing immediate rash, facial/tongue/throat swelling, SOB or lightheadedness with hypotension: Yes Has patient had a PCN reaction causing severe rash involving mucus membranes or skin necrosis: No Has patient had a PCN reaction that required hospitalization No Has patient had a PCN reaction occurring within the last 10 years: No If all of the above answers are "NO", then may proceed with Cephalosporin use.   . Advair Diskus [Fluticasone-Salmeterol] Palpitations  . Metformin And Related Other (See Comments)    Reaction:  GI upset      Current Outpatient Medications  Medication  Sig Dispense Refill  . acarbose (PRECOSE) 25 MG tablet Take 1 tablet (25 mg total) by mouth 3 (three) times daily with meals. 270 tablet 1  . acetaminophen (TYLENOL) 500 MG tablet Take 1,000 mg by mouth every 6 (six) hours as needed for mild pain, moderate pain or headache.     . Alcohol Swabs (B-D SINGLE USE SWABS REGULAR) PADS USE EVERY DAY 100 each 5  . calcium carbonate (OS-CAL - DOSED IN MG OF ELEMENTAL CALCIUM) 1250 (500 Ca) MG tablet Take 1 tablet by mouth.    . Cholecalciferol (VITAMIN D3) 5000 units CAPS Take 5,000 Units by mouth daily.    . clopidogrel (PLAVIX) 75 MG tablet Take 1 tablet (75 mg total) by mouth daily. 90 tablet 3  . hydrochlorothiazide (HYDRODIURIL) 12.5 MG tablet Take 1 tablet (12.5 mg total) by mouth daily. 90 tablet 1  . LINZESS 145 MCG CAPS capsule TAKE 1 CAPSULE 30 MINUTES PRIOR TO YOUR FIRST MEAL (Patient not taking: Reported on 09/05/2017) 90 capsule 3  . losartan (COZAAR) 50 MG tablet Take 1 tablet (50 mg total) by mouth daily. 90 tablet 3  . Multiple Vitamins-Minerals (MULTIVITAMIN WITH MINERALS) tablet Take 1 tablet by mouth daily.    . pantoprazole (PROTONIX) 40 MG tablet Take 1 tablet (40 mg total) by mouth daily. 90 tablet 3  . pravastatin (  PRAVACHOL) 20 MG tablet Take 1 tablet (20 mg total) by mouth at bedtime. 90 tablet 3  . TRUEPLUS LANCETS 33G MISC TEST EVERY DAY 100 each 5   No current facility-administered medications for this visit.      Past Surgical History:  Procedure Laterality Date  . ABDOMINAL HYSTERECTOMY    . BACK SURGERY    . BREAST SURGERY  right   cyst removed  . CAST APPLICATION  05/01/4096   Procedure: CAST APPLICATION;  Surgeon: Carole Civil, MD;  Location: [facility];  Service: Orthopedics;  Laterality: Right;  procedure room   . CHOLECYSTECTOMY    . COLONOSCOPY  12/04/2004   NUR:Few small diverticula at sigmoid colon/Small cecal polyp ablated   . COLONOSCOPY N/A 09/24/2014   SLF: small internal hemorrhoids  .  ESOPHAGOGASTRODUODENOSCOPY N/A 02/02/2016   Procedure: ESOPHAGOGASTRODUODENOSCOPY (EGD);  Surgeon: Danie Binder, MD;  Location: [facility];  Service: Endoscopy;  Laterality: N/A;  930   . KNEE ARTHROCENTESIS  left  . TUBAL LIGATION       Allergies  Allergen Reactions  . Norvasc [Amlodipine Besylate] Other (See Comments)    Reaction:  Fatigue   . Penicillins Hives and Other (See Comments)    Has patient had a PCN reaction causing immediate rash, facial/tongue/throat swelling, SOB or lightheadedness with hypotension: Yes Has patient had a PCN reaction causing severe rash involving mucus membranes or skin necrosis: No Has patient had a PCN reaction that required hospitalization No Has patient had a PCN reaction occurring within the last 10 years: No If all of the above answers are "NO", then may proceed with Cephalosporin use.   . Advair Diskus [Fluticasone-Salmeterol] Palpitations  . Metformin And Related Other (See Comments)    Reaction:  GI upset       Family History  Problem Relation Age of Onset  . Stroke Mother   . Heart attack Mother   . Cancer Sister   . Diabetes Sister   . Seizures Brother   . Diabetes Brother   . Heart disease Brother   . Kidney disease Son   . Colon cancer Neg Hx      Social History [name] reports that  has never smoked. she has never used smokeless tobacco. Ms. Wickes reports that she does not drink alcohol.   Review of Systems CONSTITUTIONAL: No weight loss, fever, chills, weakness or fatigue.  HEENT: Eyes: No visual loss, blurred vision, double vision or yellow sclerae.No hearing loss, sneezing, congestion, runny nose or sore throat.  SKIN: No rash or itching.  CARDIOVASCULAR: per hpi RESPIRATORY: No shortness of breath, cough or sputum.  GASTROINTESTINAL: No anorexia, nausea, vomiting or diarrhea. No abdominal pain or blood.  GENITOURINARY: No burning on urination, no polyuria NEUROLOGICAL: No headache, dizziness,  syncope, paralysis, ataxia, numbness or tingling in the extremities. No change in bowel or bladder control.  MUSCULOSKELETAL: No muscle, back pain, joint pain or stiffness.  LYMPHATICS: No enlarged nodes. No history of splenectomy.  PSYCHIATRIC: No history of depression or anxiety.  ENDOCRINOLOGIC: No reports of sweating, cold or heat intolerance. No polyuria or polydipsia.  [name]   Physical Examination Vitals:   09/30/17 1119  BP: (!) 160/84  Pulse: 62  SpO2: 98%   Vitals:   09/30/17 1119  Weight: 251 lb 9.6 oz (114.1 kg)  Height: 5\' 5"  (1.651 m)    Gen: resting comfortably, no acute distress HEENT: no scleral icterus, pupils equal round and reactive, no palptable cervical adenopathy,  CV: RRR, no m/r/g, no jvd Resp: Clear to auscultation bilaterally GI: abdomen is soft, non-tender, non-distended, normal bowel sounds, no hepatosplenomegaly MSK: extremities are warm, no edema.  Skin: warm, no rash Neuro:  no focal deficits Psych: appropriate affect   Diagnostic Studies  07/2016 Nuclear stress test  There was no ST segment deviation noted during stress.  The study is normal. No ischemia or infarction.  This is a low risk study.  Nuclear stress EF: 75%.   05/2011 cath ANGIOGRAPHIC FINDINGS: 1. The left main coronary artery was a short segment and had no disease. 2. The left anterior descending was a large vessel that coursed to the apex and gave off a moderate-sized diagonal [place] Inthavong. The midportion of the left anterior descending artery had a mild 20% stenosis. The diagonal Namya Voges was free of any disease. 3. Circumflex artery is a moderate-sized vessel that gives off a moderate-sized bifurcating obtuse marginal Elizabethanne Lusher. This vessel is free of any disease. 4. The right coronary artery is a moderate-sized dominant vessel with no evidence of disease. 5. Left ventricular angiogram was performed in the RAO projection and it showed  normal left ventricular systolic function with ejection fraction of 65-70%.  IMPRESSION: 1. Mild nonobstructive coronary artery disease. 2. Normal left ventricular systolic function. 3. Noncardiac chest pain.   06/2004 cath Left main coronary artery was normal.  The left anterior descending artery was normal.  Circumflex coronary artery is normal.  Right coronary artery was somewhat small but dominant. It was normal.  Right ventriculography: The right ventriculography showed hyperdynamic LV function, ejection fraction of 65%. There was no gradient across the aortic valve and no MR. The aortic pressure was 129/66, LV pressure is 130/90.  IMPRESSION: The patient has no evidence of significant coronary artery disease on RAO ventriculogram. The ascending aorta and descending thoracic aorta looked fine with no evidence of dissection. Her chest pain would appear to be noncardiac in etiology. She will be discharged later today to follow up with her primary care M.D.  The patient tolerated the procedure well.   Assessment and Plan    1. Chest pain - long history, negative workup in the past as outlined above - stable symptoms at this time, continue current meds  2. Hyperlipidemia - did not tolerate high dose statin, tolerating pravastatin - continue current statin  3. HTN - elevated in clinic. We will increase losartan to 100mg  daily, check BMET in 2 weeks.      F/u 6 months    Arnoldo Lenis, M.D.

## 2017-10-04 ENCOUNTER — Ambulatory Visit (INDEPENDENT_AMBULATORY_CARE_PROVIDER_SITE_OTHER): Payer: Medicare HMO | Admitting: Family Medicine

## 2017-10-04 ENCOUNTER — Encounter: Payer: Self-pay | Admitting: Family Medicine

## 2017-10-04 VITALS — BP 140/78 | Temp 98.1°F | Ht 65.0 in | Wt 249.0 lb

## 2017-10-04 DIAGNOSIS — R51 Headache: Secondary | ICD-10-CM | POA: Diagnosis not present

## 2017-10-04 DIAGNOSIS — I10 Essential (primary) hypertension: Secondary | ICD-10-CM

## 2017-10-04 DIAGNOSIS — R5383 Other fatigue: Secondary | ICD-10-CM

## 2017-10-04 DIAGNOSIS — R252 Cramp and spasm: Secondary | ICD-10-CM

## 2017-10-04 DIAGNOSIS — R519 Headache, unspecified: Secondary | ICD-10-CM

## 2017-10-04 DIAGNOSIS — E119 Type 2 diabetes mellitus without complications: Secondary | ICD-10-CM | POA: Diagnosis not present

## 2017-10-04 NOTE — Progress Notes (Signed)
   Subjective:    Patient ID: [name], female    DOB: 14-Aug-1946, 72 y.o.   MRN: [id number]  Hypertension  This is a chronic problem. Associated symptoms include headaches. Pertinent negatives include no chest pain or shortness of breath. (Light headed) Compliance problems include exercise.    Started having nausea after Dr. Harl Bowie increased losartan.  Has felt lightheaded ever since the increase of the losartan gets woozy when she stands up relates intermittent headaches  Headache on right side. Comes and goes. Tried tyenol last night and it helped.  Patient denies any nausea or vomiting denies any loss of consciousness  Cramps in both legs last night.  Intermittent cramps at nighttime denies any other particular troubles   Review of Systems  Constitutional: Negative for activity change, fatigue and fever.  HENT: Negative for congestion.   Respiratory: Negative for cough, chest tightness and shortness of breath.   Cardiovascular: Negative for chest pain and leg swelling.  Gastrointestinal: Negative for abdominal pain.  Skin: Negative for color change.  Neurological: Positive for headaches.  Psychiatric/Behavioral: Negative for behavioral problems.       Objective:   Physical Exam  Constitutional: She appears well-developed and well-nourished. No distress.  HENT:  Head: Normocephalic and atraumatic.  Eyes: Right eye exhibits no discharge. Left eye exhibits no discharge.  Neck: No tracheal deviation present.  Cardiovascular: Normal rate, regular rhythm and normal heart sounds.  No murmur heard. Pulmonary/Chest: Effort normal and breath sounds normal. No respiratory distress. She has no wheezes. She has no rales.  Musculoskeletal: She exhibits no edema.  Lymphadenopathy:    She has no cervical adenopathy.  Neurological: She is alert. She exhibits normal muscle tone.  Skin: Skin is warm and dry. No erythema.  Psychiatric: Her behavior is normal.  Vitals  reviewed.         Assessment & Plan:  1. Right sided temporal headache We will check sedimentation rate await the results of this.  More than likely stress related headache patient under a lot of stress with her son having stage IV kidney disease  2. Essential hypertension Blood pressure is actually too low for the patient 110/70 laying 112/70 sitting 106/60 standing we will reduce her losartan back to 50 mg recheck her again in 1 week's time if blood pressure is above stated goals by cardiology we will potentially increase her diuretic - Basic metabolic panel  3. Muscle cramp Stretching exercises recommended check lab work - Sedimentation rate - Magnesium  4. Diabetes mellitus type 2, diet-controlled ([place]) Patient states she is trying her best to keep it under control last A1c over 6 months ago was 6.1 - Hemoglobin A1c  5. Other fatigue Mild fatigue check thyroid function - TSH

## 2017-10-05 ENCOUNTER — Encounter: Payer: Self-pay | Admitting: Cardiology

## 2017-10-05 LAB — BASIC METABOLIC PANEL
BUN / CREAT RATIO: 20 (ref 12–28)
BUN: 17 mg/dL (ref 8–27)
CHLORIDE: 101 mmol/L (ref 96–106)
CO2: 23 mmol/L (ref 20–29)
Calcium: 9.9 mg/dL (ref 8.7–10.3)
Creatinine, Ser: 0.83 mg/dL (ref 0.57–1.00)
GFR calc non Af Amer: 72 mL/min/{1.73_m2} (ref >59)
GFR, EST AFRICAN AMERICAN: 83 mL/min/{1.73_m2} (ref >59)
GLUCOSE: 115 mg/dL — AB (ref 65–99)
POTASSIUM: 4.6 mmol/L (ref 3.5–5.2)
Sodium: 140 mmol/L (ref 134–144)

## 2017-10-05 LAB — TSH: TSH: 2.38 u[IU]/mL (ref 0.450–4.500)

## 2017-10-05 LAB — SEDIMENTATION RATE: SED RATE: 20 mm/h (ref 0–40)

## 2017-10-05 LAB — MAGNESIUM: Magnesium: 2.2 mg/dL (ref 1.6–2.3)

## 2017-10-05 LAB — HEMOGLOBIN A1C
Est. average glucose Bld gHb Est-mCnc: 157 mg/dL
Hgb A1c MFr Bld: 7.1 % — ABNORMAL HIGH (ref 4.8–5.6)

## 2017-10-11 ENCOUNTER — Ambulatory Visit (INDEPENDENT_AMBULATORY_CARE_PROVIDER_SITE_OTHER): Payer: Medicare HMO | Admitting: Family Medicine

## 2017-10-11 ENCOUNTER — Encounter: Payer: Self-pay | Admitting: Family Medicine

## 2017-10-11 VITALS — BP 124/76 | Ht 65.0 in | Wt 247.1 lb

## 2017-10-11 DIAGNOSIS — I10 Essential (primary) hypertension: Secondary | ICD-10-CM

## 2017-10-11 MED ORDER — LOSARTAN POTASSIUM 50 MG PO TABS: 50.0000 mg | ORAL_TABLET | Freq: Every day | ORAL | 3 refills | Status: DC

## 2017-10-11 NOTE — Progress Notes (Signed)
   Subjective:    Patient ID: [name], female    DOB: Jun 19, 1946, 72 y.o.   MRN: [id number]  HPI  Patient is here today to follow up on headaches.She states her headaches are not as bad as they have been,but she is still exp them .She states she think bp being high was the cause of the headaches.Was started on Losartan heart Dr. Increased to 100 mg daily and [name] decreased back to 50 mg.Here today to see if any more adjustments need to be made.Sitting and standing Bp done today at [facility].  Review of Systems  Constitutional: Negative for activity change, fatigue and fever.  HENT: Negative for congestion.   Respiratory: Negative for cough, chest tightness and shortness of breath.   Cardiovascular: Negative for chest pain and leg swelling.  Gastrointestinal: Negative for abdominal pain.  Skin: Negative for color change.  Neurological: Positive for headaches.  Psychiatric/Behavioral: Negative for behavioral problems.       Objective:   Physical Exam  Constitutional: She appears well-developed and well-nourished. No distress.  HENT:  Head: Normocephalic and atraumatic.  Eyes: Right eye exhibits no discharge. Left eye exhibits no discharge.  Neck: No tracheal deviation present.  Cardiovascular: Normal rate, regular rhythm and normal heart sounds.  No murmur heard. Pulmonary/Chest: Effort normal and breath sounds normal. No respiratory distress. She has no wheezes. She has no rales.  Musculoskeletal: She exhibits no edema.  Lymphadenopathy:    She has no cervical adenopathy.  Neurological: She is alert. She exhibits normal muscle tone.  Skin: Skin is warm and dry. No erythema.  Psychiatric: Her behavior is normal.  Vitals reviewed.         Assessment & Plan:  Intermittent headaches more often when she is stressed she denies being depressed under a lot of stress with her son's dialysis and her sister's illness patient not eating properly printout given  regarding importance of healthy eating  Blood pressure is actually much better she is doing well on 50 mg losartan had a recent lab work completed which looks good we will send a note to her cardiologist updating him on the her issues

## 2017-10-11 NOTE — Patient Instructions (Addendum)
DASH Eating Plan DASH stands for "Dietary Approaches to Stop Hypertension." The DASH eating plan is a healthy eating plan that has been shown to reduce high blood pressure (hypertension). It may also reduce your risk for type 2 diabetes, heart disease, and stroke. The DASH eating plan may also help with weight loss. What are tips for following this plan? General guidelines  Avoid eating more than 2,300 mg (milligrams) of salt (sodium) a day. If you have hypertension, you may need to reduce your sodium intake to 1,500 mg a day.  Limit alcohol intake to no more than 1 drink a day for nonpregnant women and 2 drinks a day for men. One drink equals 12 oz of beer, 5 oz of wine, or 1 oz of hard liquor.  Work with your health care provider to maintain a healthy body weight or to lose weight. Ask what an ideal weight is for you.  Get at least 30 minutes of exercise that causes your heart to beat faster (aerobic exercise) most days of the week. Activities may include walking, swimming, or biking.  Work with your health care provider or diet and nutrition specialist (dietitian) to adjust your eating plan to your individual calorie needs. Reading food labels  Check food labels for the amount of sodium per serving. Choose foods with less than 5 percent of the Daily Value of sodium. Generally, foods with less than 300 mg of sodium per serving fit into this eating plan.  To find whole grains, look for the word "whole" as the first word in the ingredient list. Shopping  Buy products labeled as "low-sodium" or "no salt added."  Buy fresh foods. Avoid canned foods and premade or frozen meals. Cooking  Avoid adding salt when cooking. Use salt-free seasonings or herbs instead of table salt or sea salt. Check with your health care provider or pharmacist before using salt substitutes.  Do not fry foods. Cook foods using healthy methods such as baking, boiling, grilling, and broiling instead.  Cook with  heart-healthy oils, such as olive, canola, soybean, or sunflower oil. Meal planning   Eat a balanced diet that includes: ? 5 or more servings of fruits and vegetables each day. At each meal, try to fill half of your plate with fruits and vegetables. ? Up to 6-8 servings of whole grains each day. ? Less than 6 oz of lean meat, poultry, or fish each day. A 3-oz serving of meat is about the same size as a deck of cards. One egg equals 1 oz. ? 2 servings of low-fat dairy each day. ? A serving of nuts, seeds, or beans 5 times each week. ? Heart-healthy fats. Healthy fats called Omega-3 fatty acids are found in foods such as flaxseeds and coldwater fish, like sardines, salmon, and mackerel.  Limit how much you eat of the following: ? Canned or prepackaged foods. ? Food that is high in trans fat, such as fried foods. ? Food that is high in saturated fat, such as fatty meat. ? Sweets, desserts, sugary drinks, and other foods with added sugar. ? Full-fat dairy products.  Do not salt foods before eating.  Try to eat at least 2 vegetarian meals each week.  Eat more home-cooked food and less restaurant, buffet, and fast food.  When eating at a restaurant, ask that your food be prepared with less salt or no salt, if possible. What foods are recommended? The items listed may not be a complete list. Talk with your dietitian about what   dietary choices are best for you. Grains Whole-grain or whole-wheat bread. Whole-grain or whole-wheat pasta. Brown rice. Oatmeal. Quinoa. Bulgur. Whole-grain and low-sodium cereals. Pita bread. Low-fat, low-sodium crackers. Whole-wheat flour tortillas. Vegetables Fresh or frozen vegetables (raw, steamed, roasted, or grilled). Low-sodium or reduced-sodium tomato and vegetable juice. Low-sodium or reduced-sodium tomato sauce and tomato paste. Low-sodium or reduced-sodium canned vegetables. Fruits All fresh, dried, or frozen fruit. Canned fruit in natural juice (without  added sugar). Meat and other protein foods Skinless chicken or turkey. Ground chicken or turkey. Pork with fat trimmed off. Fish and seafood. Egg whites. Dried beans, peas, or lentils. Unsalted nuts, nut butters, and seeds. Unsalted canned beans. Lean cuts of beef with fat trimmed off. Low-sodium, lean deli meat. Dairy Low-fat (1%) or fat-free (skim) milk. Fat-free, low-fat, or reduced-fat cheeses. Nonfat, low-sodium ricotta or cottage cheese. Low-fat or nonfat yogurt. Low-fat, low-sodium cheese. Fats and oils Soft margarine without trans fats. Vegetable oil. Low-fat, reduced-fat, or light mayonnaise and salad dressings (reduced-sodium). Canola, safflower, olive, soybean, and sunflower oils. Avocado. Seasoning and other foods Herbs. Spices. Seasoning mixes without salt. Unsalted popcorn and pretzels. Fat-free sweets. What foods are not recommended? The items listed may not be a complete list. Talk with your dietitian about what dietary choices are best for you. Grains Baked goods made with fat, such as croissants, muffins, or some breads. Dry pasta or rice meal packs. Vegetables Creamed or fried vegetables. Vegetables in a cheese sauce. Regular canned vegetables (not low-sodium or reduced-sodium). Regular canned tomato sauce and paste (not low-sodium or reduced-sodium). Regular tomato and vegetable juice (not low-sodium or reduced-sodium). Pickles. Olives. Fruits Canned fruit in a light or heavy syrup. Fried fruit. Fruit in cream or butter sauce. Meat and other protein foods Fatty cuts of meat. Ribs. Fried meat. Bacon. Sausage. Bologna and other processed lunch meats. Salami. Fatback. Hotdogs. Bratwurst. Salted nuts and seeds. Canned beans with added salt. Canned or smoked fish. Whole eggs or egg yolks. Chicken or turkey with skin. Dairy Whole or 2% milk, cream, and half-and-half. Whole or full-fat cream cheese. Whole-fat or sweetened yogurt. Full-fat cheese. Nondairy creamers. Whipped toppings.  Processed cheese and cheese spreads. Fats and oils Butter. Stick margarine. Lard. Shortening. Ghee. Bacon fat. Tropical oils, such as coconut, palm kernel, or palm oil. Seasoning and other foods Salted popcorn and pretzels. Onion salt, garlic salt, seasoned salt, table salt, and sea salt. Worcestershire sauce. Tartar sauce. Barbecue sauce. Teriyaki sauce. Soy sauce, including reduced-sodium. Steak sauce. Canned and packaged gravies. Fish sauce. Oyster sauce. Cocktail sauce. Horseradish that you find on the shelf. Ketchup. Mustard. Meat flavorings and tenderizers. Bouillon cubes. Hot sauce and Tabasco sauce. Premade or packaged marinades. Premade or packaged taco seasonings. Relishes. Regular salad dressings. Where to find more information:  National Heart, Lung, and Blood Institute: www.nhlbi.nih.gov  American Heart Association: www.heart.org Summary  The DASH eating plan is a healthy eating plan that has been shown to reduce high blood pressure (hypertension). It may also reduce your risk for type 2 diabetes, heart disease, and stroke.  With the DASH eating plan, you should limit salt (sodium) intake to 2,300 mg a day. If you have hypertension, you may need to reduce your sodium intake to 1,500 mg a day.  When on the DASH eating plan, aim to eat more fresh fruits and vegetables, whole grains, lean proteins, low-fat dairy, and heart-healthy fats.  Work with your health care provider or diet and nutrition specialist (dietitian) to adjust your eating plan to your individual   calorie needs. This information is not intended to replace advice given to you by your health care provider. Make sure you discuss any questions you have with your health care provider. Document Released: 08/05/2011 Document Revised: 08/09/2016 Document Reviewed: 08/09/2016 Elsevier Interactive Patient Education  2018 Elsevier Inc.  Diabetes Mellitus and Nutrition When you have diabetes (diabetes mellitus), it is very  important to have healthy eating habits because your blood sugar (glucose) levels are greatly affected by what you eat and drink. Eating healthy foods in the appropriate amounts, at about the same times every day, can help you:  Control your blood glucose.  Lower your risk of heart disease.  Improve your blood pressure.  Reach or maintain a healthy weight.  Every person with diabetes is different, and each person has different needs for a meal plan. Your health care provider may recommend that you work with a diet and nutrition specialist (dietitian) to make a meal plan that is best for you. Your meal plan may vary depending on factors such as:  The calories you need.  The medicines you take.  Your weight.  Your blood glucose, blood pressure, and cholesterol levels.  Your activity level.  Other health conditions you have, such as heart or kidney disease.  How do carbohydrates affect me? Carbohydrates affect your blood glucose level more than any other type of food. Eating carbohydrates naturally increases the amount of glucose in your blood. Carbohydrate counting is a method for keeping track of how many carbohydrates you eat. Counting carbohydrates is important to keep your blood glucose at a healthy level, especially if you use insulin or take certain oral diabetes medicines. It is important to know how many carbohydrates you can safely have in each meal. This is different for every person. Your dietitian can help you calculate how many carbohydrates you should have at each meal and for snack. Foods that contain carbohydrates include:  Bread, cereal, rice, pasta, and crackers.  Potatoes and corn.  Peas, beans, and lentils.  Milk and yogurt.  Fruit and juice.  Desserts, such as cakes, cookies, ice cream, and candy.  How does alcohol affect me? Alcohol can cause a sudden decrease in blood glucose (hypoglycemia), especially if you use insulin or take certain oral diabetes  medicines. Hypoglycemia can be a life-threatening condition. Symptoms of hypoglycemia (sleepiness, dizziness, and confusion) are similar to symptoms of having too much alcohol. If your health care provider says that alcohol is safe for you, follow these guidelines:  Limit alcohol intake to no more than 1 drink per day for nonpregnant women and 2 drinks per day for men. One drink equals 12 oz of beer, 5 oz of wine, or 1 oz of hard liquor.  Do not drink on an empty stomach.  Keep yourself hydrated with water, diet soda, or unsweetened iced tea.  Keep in mind that regular soda, juice, and other mixers may contain a lot of sugar and must be counted as carbohydrates.  What are tips for following this plan? Reading food labels  Start by checking the serving size on the label. The amount of calories, carbohydrates, fats, and other nutrients listed on the label are based on one serving of the food. Many foods contain more than one serving per package.  Check the total grams (g) of carbohydrates in one serving. You can calculate the number of servings of carbohydrates in one serving by dividing the total carbohydrates by 15. For example, if a food has 30 g of total   carbohydrates, it would be equal to 2 servings of carbohydrates.  Check the number of grams (g) of saturated and trans fats in one serving. Choose foods that have low or no amount of these fats.  Check the number of milligrams (mg) of sodium in one serving. Most people should limit total sodium intake to less than 2,300 mg per day.  Always check the nutrition information of foods labeled as "low-fat" or "nonfat". These foods may be higher in added sugar or refined carbohydrates and should be avoided.  Talk to your dietitian to identify your daily goals for nutrients listed on the label. Shopping  Avoid buying canned, premade, or processed foods. These foods tend to be high in fat, sodium, and added sugar.  Shop around the outside edge  of the grocery store. This includes fresh fruits and vegetables, bulk grains, fresh meats, and fresh dairy. Cooking  Use low-heat cooking methods, such as baking, instead of high-heat cooking methods like deep frying.  Cook using healthy oils, such as olive, canola, or sunflower oil.  Avoid cooking with butter, cream, or high-fat meats. Meal planning  Eat meals and snacks regularly, preferably at the same times every day. Avoid going long periods of time without eating.  Eat foods high in fiber, such as fresh fruits, vegetables, beans, and whole grains. Talk to your dietitian about how many servings of carbohydrates you can eat at each meal.  Eat 4-6 ounces of lean protein each day, such as lean meat, chicken, fish, eggs, or tofu. 1 ounce is equal to 1 ounce of meat, chicken, or fish, 1 egg, or 1/4 cup of tofu.  Eat some foods each day that contain healthy fats, such as avocado, nuts, seeds, and fish. Lifestyle   Check your blood glucose regularly.  Exercise at least 30 minutes 5 or more days each week, or as told by your health care provider.  Take medicines as told by your health care provider.  Do not use any products that contain nicotine or tobacco, such as cigarettes and e-cigarettes. If you need help quitting, ask your health care provider.  Work with a counselor or diabetes educator to identify strategies to manage stress and any emotional and social challenges. What are some questions to ask my health care provider?  Do I need to meet with a diabetes educator?  Do I need to meet with a dietitian?  What number can I call if I have questions?  When are the best times to check my blood glucose? Where to find more information:  American Diabetes Association: diabetes.org/food-and-fitness/food  Academy of Nutrition and Dietetics: www.eatright.org/resources/health/diseases-and-conditions/diabetes  National Institute of Diabetes and Digestive and Kidney Diseases (NIH):  www.niddk.nih.gov/health-information/diabetes/overview/diet-eating-physical-activity Summary  A healthy meal plan will help you control your blood glucose and maintain a healthy lifestyle.  Working with a diet and nutrition specialist (dietitian) can help you make a meal plan that is best for you.  Keep in mind that carbohydrates and alcohol have immediate effects on your blood glucose levels. It is important to count carbohydrates and to use alcohol carefully. This information is not intended to replace advice given to you by your health care provider. Make sure you discuss any questions you have with your health care provider. Document Released: 05/13/2005 Document Revised: 09/20/2016 Document Reviewed: 09/20/2016 Elsevier Interactive Patient Education  2018 Elsevier Inc.  

## 2017-10-24 ENCOUNTER — Ambulatory Visit (INDEPENDENT_AMBULATORY_CARE_PROVIDER_SITE_OTHER): Payer: Medicare HMO | Admitting: Family Medicine

## 2017-10-24 VITALS — BP 132/80 | Temp 97.8°F | Ht 65.0 in | Wt 248.0 lb

## 2017-10-24 DIAGNOSIS — L03221 Cellulitis of neck: Secondary | ICD-10-CM | POA: Diagnosis not present

## 2017-10-24 DIAGNOSIS — R21 Rash and other nonspecific skin eruption: Secondary | ICD-10-CM | POA: Diagnosis not present

## 2017-10-24 MED ORDER — VALACYCLOVIR HCL 1 G PO TABS: 1000.0000 mg | ORAL_TABLET | Freq: Three times a day (TID) | ORAL | 0 refills | Status: DC

## 2017-10-24 MED ORDER — DOXYCYCLINE HYCLATE 100 MG PO TABS: 100.0000 mg | ORAL_TABLET | Freq: Two times a day (BID) | ORAL | 0 refills | Status: DC

## 2017-10-24 MED ORDER — HYDROCODONE-ACETAMINOPHEN 10-325 MG PO TABS: 1.0000 | ORAL_TABLET | ORAL | 0 refills | Status: DC | PRN

## 2017-10-24 MED ORDER — METHYLPREDNISOLONE ACETATE 40 MG/ML IJ SUSP
40.0000 mg | Freq: Once | INTRAMUSCULAR | Status: AC
  Administered 2017-10-24: 40 mg via INTRAMUSCULAR

## 2017-10-24 NOTE — Progress Notes (Signed)
   Subjective:    Patient ID: [name], female    DOB: April 01, 1946, 72 y.o.   MRN: [id number]  Rash  This is a new problem. Episode onset: over night. Location: left side of neck. The rash is characterized by burning, pain, redness, swelling and itchiness. She was exposed to nothing. Pertinent negatives include no congestion, cough, fatigue, fever or shortness of breath. Treatments tried: tylenol.  pt states she thinks it may be shingles again. Had in the past and feels the same way and it is in same location.   She relates it itches and burns around her neck flared up red there is no blisters with it no other particular drainage  Review of Systems  Constitutional: Negative for activity change, fatigue and fever.  HENT: Negative for congestion.   Respiratory: Negative for cough, chest tightness and shortness of breath.   Cardiovascular: Negative for chest pain and leg swelling.  Gastrointestinal: Negative for abdominal pain.  Skin: Positive for rash. Negative for color change.  Neurological: Negative for headaches.  Psychiatric/Behavioral: Negative for behavioral problems.       Objective:   Physical Exam  Constitutional: She appears well-developed and well-nourished. No distress.  HENT:  Head: Normocephalic and atraumatic.  Eyes: Right eye exhibits no discharge. Left eye exhibits no discharge.  Neck: No tracheal deviation present.  Cardiovascular: Normal rate, regular rhythm and normal heart sounds.  No murmur heard. Pulmonary/Chest: Effort normal and breath sounds normal. No respiratory distress. She has no wheezes. She has no rales.  Musculoskeletal: She exhibits no edema.  Lymphadenopathy:    She has no cervical adenopathy.  Neurological: She is alert. She exhibits normal muscle tone.  Skin: Skin is warm and dry. Rash noted. There is erythema.  Psychiatric: Her behavior is normal.  Vitals reviewed.   Erythematous rash on the neck on the left side        Assessment & Plan:  It is possible this could be shingles but it is not a large patch which could also be an allergic reaction which could also be localized cellulitis we will cover with Valtrex antibiotics and a small dose of steroids with a follow-up office visit in 48 hours follow-up sooner problems prescription for pain medicine given caution drowsiness home use only

## 2017-10-26 ENCOUNTER — Encounter: Payer: Self-pay | Admitting: Family Medicine

## 2017-10-26 ENCOUNTER — Ambulatory Visit (INDEPENDENT_AMBULATORY_CARE_PROVIDER_SITE_OTHER): Payer: Medicare HMO | Admitting: Family Medicine

## 2017-10-26 VITALS — BP 132/70 | Temp 97.8°F | Ht 65.0 in | Wt 248.0 lb

## 2017-10-26 DIAGNOSIS — L03221 Cellulitis of neck: Secondary | ICD-10-CM

## 2017-10-26 NOTE — Progress Notes (Signed)
   Subjective:    Patient ID: [name], female    DOB: 03-Aug-1946, 72 y.o.   MRN: [id number]  HPIRecheck Rash on left side of neck. Pt states she is now hurting all over especially around neck and shoulders.   The rash is actually doing better there is less redness tenderness no difficulty breathing no wheezing congestion cough no vomiting.  PMH benign  Review of Systems  Constitutional: Negative for activity change, fatigue and fever.  HENT: Negative for congestion.   Respiratory: Negative for cough, chest tightness and shortness of breath.   Cardiovascular: Negative for chest pain and leg swelling.  Gastrointestinal: Negative for abdominal pain.  Skin: Negative for color change.  Neurological: Negative for headaches.  Psychiatric/Behavioral: Negative for behavioral problems.       Objective:   Physical Exam  Constitutional: She appears well-developed and well-nourished. No distress.  HENT:  Head: Normocephalic and atraumatic.  Eyes: Right eye exhibits no discharge. Left eye exhibits no discharge.  Neck: No tracheal deviation present.  Cardiovascular: Normal rate, regular rhythm and normal heart sounds.  No murmur heard. Pulmonary/Chest: Effort normal and breath sounds normal. No respiratory distress. She has no wheezes. She has no rales.  Musculoskeletal: She exhibits no edema.  Lymphadenopathy:    She has no cervical adenopathy.  Neurological: She is alert. She exhibits normal muscle tone.  Skin: Skin is warm and dry. No erythema.  Psychiatric: Her behavior is normal.  Vitals reviewed.   She has dryness skin where she had the redness I believe that this is healing cellulitis I do not see any evidence of any type of shingles currently but she should just go ahead and finish out her medication cellulitis finish out the antibiotics finish out the Valtrex use pain medication sparingly follow-up if progressive troubles       Assessment & Plan:  Please see above

## 2017-11-01 ENCOUNTER — Telehealth: Payer: Self-pay | Admitting: Family Medicine

## 2017-11-01 MED ORDER — LOSARTAN POTASSIUM 50 MG PO TABS: 50.0000 mg | ORAL_TABLET | Freq: Every day | ORAL | 1 refills | Status: DC

## 2017-11-01 NOTE — Telephone Encounter (Signed)
Prescription sent electronically to pharmacy. Patient notified. 

## 2017-11-01 NOTE — Telephone Encounter (Signed)
Patient said that she is having trouble getting her losartan filled.  She said the pharmacy is telling her they do not have the Rx on file that Dr. Nicki Reaper sent.  Please advise.   [place]

## 2017-11-22 ENCOUNTER — Ambulatory Visit: Payer: Medicare HMO | Admitting: Family Medicine

## 2017-11-24 ENCOUNTER — Ambulatory Visit: Payer: Medicare HMO | Admitting: Family Medicine

## 2017-11-24 NOTE — Telephone Encounter (Signed)
LMOM for a return call.  

## 2017-11-29 ENCOUNTER — Ambulatory Visit (INDEPENDENT_AMBULATORY_CARE_PROVIDER_SITE_OTHER): Payer: Medicare HMO | Admitting: Family Medicine

## 2017-11-29 ENCOUNTER — Encounter: Payer: Self-pay | Admitting: Family Medicine

## 2017-11-29 VITALS — BP 120/80 | Ht 65.0 in | Wt 242.0 lb

## 2017-11-29 DIAGNOSIS — Z1231 Encounter for screening mammogram for malignant neoplasm of breast: Secondary | ICD-10-CM | POA: Diagnosis not present

## 2017-11-29 DIAGNOSIS — Z13220 Encounter for screening for lipoid disorders: Secondary | ICD-10-CM | POA: Diagnosis not present

## 2017-11-29 DIAGNOSIS — M79674 Pain in right toe(s): Secondary | ICD-10-CM | POA: Diagnosis not present

## 2017-11-29 DIAGNOSIS — E1140 Type 2 diabetes mellitus with diabetic neuropathy, unspecified: Secondary | ICD-10-CM | POA: Diagnosis not present

## 2017-11-29 DIAGNOSIS — Z1159 Encounter for screening for other viral diseases: Secondary | ICD-10-CM

## 2017-11-29 DIAGNOSIS — Z79899 Other long term (current) drug therapy: Secondary | ICD-10-CM

## 2017-11-29 DIAGNOSIS — I10 Essential (primary) hypertension: Secondary | ICD-10-CM | POA: Diagnosis not present

## 2017-11-29 MED ORDER — HYDROCHLOROTHIAZIDE 12.5 MG PO TABS: 12.5000 mg | ORAL_TABLET | Freq: Every day | ORAL | 1 refills | Status: DC

## 2017-11-29 MED ORDER — ACARBOSE 25 MG PO TABS: 25.0000 mg | ORAL_TABLET | Freq: Three times a day (TID) | ORAL | 1 refills | Status: DC

## 2017-11-29 NOTE — Progress Notes (Signed)
   Subjective:    Patient ID: [name], female    DOB: [birth year], 72 y.o.   MRN: [id number]  HPI Patient is here today for a follow up on her chronic health issues.She has Dm and is on precose 25 mg she takes one po tid with meals. She eats healthy and gets exercise at the [facility]. She does not see an eye or a foot Dr. She states she probably needs to see the foot doc as she is having problems with Right great toe. Last A1c was done 10/04/2017 at 7.1. The patient states she has had a reoccurrence of the rash around her neck but then it went away on its own she denies any particular troubles Her moods are doing better denies being depressed  Review of Systems  Constitutional: Negative for activity change, appetite change and fatigue.  HENT: Negative for congestion.   Respiratory: Negative for cough.   Cardiovascular: Negative for chest pain.  Gastrointestinal: Negative for abdominal pain.  Endocrine: Negative for polydipsia and polyphagia.  Skin: Negative for color change.  Neurological: Negative for weakness.  Psychiatric/Behavioral: Negative for confusion.       Objective:   Physical Exam  Constitutional: She appears well-developed and well-nourished. No distress.  HENT:  Head: Normocephalic and atraumatic.  Eyes: Right eye exhibits no discharge. Left eye exhibits no discharge.  Neck: No tracheal deviation present.  Cardiovascular: Normal rate, regular rhythm and normal heart sounds.  No murmur heard. Pulmonary/Chest: Effort normal and breath sounds normal. No respiratory distress. She has no wheezes. She has no rales.  Musculoskeletal: She exhibits no edema.  Lymphadenopathy:    She has no cervical adenopathy.  Neurological: She is alert. She exhibits normal muscle tone.  Skin: Skin is warm and dry. No erythema.  Psychiatric: Her behavior is normal.  Vitals reviewed.         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

## 2017-11-30 ENCOUNTER — Telehealth: Payer: Self-pay

## 2017-11-30 NOTE — Telephone Encounter (Signed)
LMOM for a return call.  Need pt to check with [employer] and see if she needs to try a different medication.

## 2017-11-30 NOTE — Telephone Encounter (Signed)
LATE ENTRY:  Pt still having problems getting her LInzess 145 mcg.  She had been told she was approved, but she can't afford. She said she would not qualify for pt assistance due to her husband's income. I gave her some samples of the Linzess 145 mcg. #12.

## 2017-12-01 ENCOUNTER — Encounter: Payer: Self-pay | Admitting: Family Medicine

## 2017-12-01 ENCOUNTER — Telehealth: Payer: Self-pay | Admitting: Family Medicine

## 2017-12-01 NOTE — Telephone Encounter (Signed)
Pt called stating that her diabetic medicine is considered a tier 3 medication and will cost her $125. Pt states that the insurance company states that if the Dr. Lynnda Child write a letter stating that she really needs this medication then they can possibly get it down to a tier 2 where it will be less. Pt states that this letter will need to be sent to [facility].

## 2017-12-01 NOTE — Telephone Encounter (Signed)
Pt will discuss with [employer] what her options are for meds in place of the Linzess. She said Linzess works so well, but it would cost her over $400.00 a month.

## 2017-12-01 NOTE — Telephone Encounter (Signed)
Pt said she called [employer] and all of the constipation meds except Lactulose are three tier and would be expensive.  She has samples now.

## 2017-12-01 NOTE — Telephone Encounter (Signed)
This letter was dictated.Erica please make this available to the nurses to send to [facility].  Thank you-notify the patient that we are trying to do the best you can we did do a letter we sent in we will see what they say

## 2017-12-02 NOTE — Telephone Encounter (Signed)
Faxed letter to [employer]. Notified pt. Copy of letter sent to medical records to be scanned.

## 2017-12-05 ENCOUNTER — Encounter: Payer: Self-pay | Admitting: Family Medicine

## 2017-12-06 ENCOUNTER — Telehealth: Payer: Self-pay

## 2017-12-06 NOTE — Telephone Encounter (Signed)
Patient is aware her Acarbose 25 mg has been approved Authorized until 08/29/2018.Per [employer]

## 2017-12-06 NOTE — Telephone Encounter (Signed)
So noted 

## 2017-12-13 ENCOUNTER — Emergency Department (HOSPITAL_COMMUNITY): Payer: Medicare HMO

## 2017-12-13 ENCOUNTER — Ambulatory Visit: Payer: Medicare HMO | Admitting: Nurse Practitioner

## 2017-12-13 ENCOUNTER — Encounter (HOSPITAL_COMMUNITY): Payer: Self-pay | Admitting: Emergency Medicine

## 2017-12-13 ENCOUNTER — Other Ambulatory Visit: Payer: Self-pay

## 2017-12-13 ENCOUNTER — Telehealth: Payer: Self-pay | Admitting: *Deleted

## 2017-12-13 ENCOUNTER — Encounter: Payer: Self-pay | Admitting: Nurse Practitioner

## 2017-12-13 ENCOUNTER — Ambulatory Visit (INDEPENDENT_AMBULATORY_CARE_PROVIDER_SITE_OTHER): Payer: Medicare HMO | Admitting: Nurse Practitioner

## 2017-12-13 ENCOUNTER — Emergency Department (HOSPITAL_COMMUNITY)
Admission: EM | Admit: 2017-12-13 | Discharge: 2017-12-13 | Disposition: A | Discharge status: Home / Self Care | Payer: Medicare HMO | Attending: Emergency Medicine | Admitting: Emergency Medicine

## 2017-12-13 VITALS — BP 175/76 | HR 55 | Temp 97.1°F | Ht 64.0 in | Wt 239.8 lb

## 2017-12-13 DIAGNOSIS — E119 Type 2 diabetes mellitus without complications: Secondary | ICD-10-CM | POA: Diagnosis not present

## 2017-12-13 DIAGNOSIS — R079 Chest pain, unspecified: Secondary | ICD-10-CM | POA: Diagnosis not present

## 2017-12-13 DIAGNOSIS — K2100 Gastro-esophageal reflux disease with esophagitis, without bleeding: Secondary | ICD-10-CM

## 2017-12-13 DIAGNOSIS — Z79899 Other long term (current) drug therapy: Secondary | ICD-10-CM | POA: Insufficient documentation

## 2017-12-13 DIAGNOSIS — R110 Nausea: Secondary | ICD-10-CM | POA: Diagnosis not present

## 2017-12-13 DIAGNOSIS — I10 Essential (primary) hypertension: Secondary | ICD-10-CM | POA: Diagnosis not present

## 2017-12-13 DIAGNOSIS — K5900 Constipation, unspecified: Secondary | ICD-10-CM | POA: Diagnosis not present

## 2017-12-13 DIAGNOSIS — R072 Precordial pain: Secondary | ICD-10-CM | POA: Diagnosis not present

## 2017-12-13 DIAGNOSIS — R634 Abnormal weight loss: Secondary | ICD-10-CM

## 2017-12-13 DIAGNOSIS — M542 Cervicalgia: Secondary | ICD-10-CM | POA: Diagnosis not present

## 2017-12-13 DIAGNOSIS — R42 Dizziness and giddiness: Secondary | ICD-10-CM | POA: Diagnosis not present

## 2017-12-13 LAB — BASIC METABOLIC PANEL
ANION GAP: 10 (ref 5–15)
BUN: 19 mg/dL (ref 6–20)
CALCIUM: 9.1 mg/dL (ref 8.9–10.3)
CO2: 25 mmol/L (ref 22–32)
Chloride: 104 mmol/L (ref 101–111)
Creatinine, Ser: 0.84 mg/dL (ref 0.44–1.00)
GLUCOSE: 94 mg/dL (ref 65–99)
Potassium: 4.1 mmol/L (ref 3.5–5.1)
Sodium: 139 mmol/L (ref 135–145)

## 2017-12-13 LAB — CBC
HEMATOCRIT: 40.6 % (ref 36.0–46.0)
HEMOGLOBIN: 12.5 g/dL (ref 12.0–15.0)
MCH: 24.8 pg — ABNORMAL LOW (ref 26.0–34.0)
MCHC: 30.8 g/dL (ref 30.0–36.0)
MCV: 80.6 fL (ref 78.0–100.0)
Platelets: 266 10*3/uL (ref 150–400)
RBC: 5.04 MIL/uL (ref 3.87–5.11)
RDW: 16.9 % — ABNORMAL HIGH (ref 11.5–15.5)
WBC: 5.4 10*3/uL (ref 4.0–10.5)

## 2017-12-13 LAB — TROPONIN I

## 2017-12-13 MED ORDER — ONDANSETRON 4 MG PO TBDP: 4.0000 mg | ORAL_TABLET | Freq: Three times a day (TID) | ORAL | 1 refills | Status: DC | PRN

## 2017-12-13 MED ORDER — TRAMADOL HCL 50 MG PO TABS: 50.0000 mg | ORAL_TABLET | Freq: Four times a day (QID) | ORAL | 0 refills | Status: DC | PRN

## 2017-12-13 MED ORDER — SODIUM CHLORIDE 0.9 % IV SOLN: INTRAVENOUS | Status: DC

## 2017-12-13 MED ORDER — IOPAMIDOL (ISOVUE-370) INJECTION 76%
100.0000 mL | Freq: Once | INTRAVENOUS | Status: AC | PRN
  Administered 2017-12-13: 100 mL via INTRAVENOUS

## 2017-12-13 NOTE — Assessment & Plan Note (Signed)
She notes some unintentional weight loss which is documented objectively.  Approximately 15 pounds in 3 months.  Her colonoscopy and endoscopy are up-to-date.  She has had worsening constipation which is resulted in nausea and decreased appetite.  At this point we will treat her constipation as per below, follow-up in 2 months.

## 2017-12-13 NOTE — Patient Instructions (Signed)
1. Try to drink plenty of water or water-like substances, as we discussed. 2. Take a fiber supplement daily.  There are multiple options such as Gummi chews, fruit juice, powders, etc.  Use the option you are most likely to take regularly. 3. Start taking Colace 100 mg once a day. 4. If these 2 are not effective, you can add MiraLAX once a day or, up to twice a day. 5. Follow-up in 2 months. 6. Call us if you have any questions or concerns.    It was great seeing you today. I hope you have a good start to spring/summer!!!    At [facility] we value your feedback. You may receive a survey about your visit today. Please share your experience as we strive to create trusting relationships with our patients to provide genuine, compassionate, quality care.

## 2017-12-13 NOTE — Assessment & Plan Note (Signed)
Currently well managed.  Continue PPI.  Follow-up in 2 months.

## 2017-12-13 NOTE — Assessment & Plan Note (Signed)
Chronic constipation which is failed single element treatments other than Linzess.  Unfortunately, her insurance company covers all prescription constipation medicines at your 3 other than lactulose.  She cannot afford this.  At this point I will try stacking over-the-counter medications.  I will have her start daily fiber as she admits she does not eat much fiber.  I have encouraged her to drink plenty of water.  I will also have her take Colace 100 mg once a day and, if needed, MiraLAX 1-2 times a day.  Follow-up in 2 months.  Depending on how any improvement and constipation changes her appetite and weight loss, we may need further evaluation.

## 2017-12-13 NOTE — Telephone Encounter (Signed)
Pt just left dr fields office and she states her blood pressure was elevated 175/76. She states they also wanted her to have an EKG because she is having chest tightness. She states she is having nausea and feels woozy. Pt advised to go directly to ED. She states she does not have a way to the hospital. Pt advised to call [phone]. Pt states she will call 911.

## 2017-12-13 NOTE — Discharge Instructions (Addendum)
Workup consistent with chest wall pain. Take tramadol for the pain.  Take Zofran as needed for any nausea.  Make an appointment to follow-up with your primary care doctor.  Return for any new or worse symptoms.  Pain seems to be noncardiac in nature.  No evidence of pneumonia.  No evidence of pulmonary embolus.

## 2017-12-13 NOTE — ED Provider Notes (Signed)
Blood pressure (!) 145/68, pulse 60, temperature 98.2 F (36.8 C), temperature source Oral, resp. rate 18, height 5\' 4"  (1.626 m), weight 108.4 kg (239 lb), SpO2 100 %.  Assuming care from Dr. Rogene Houston.  In short, [name] is a 72 y.o. female with a chief complaint of Chest Pain .  Refer to the original H&P for additional details.  The current plan of care is to f/u CTA and reassess.  Patient CTA reviewed with no acute findings. Plan for discharge at this time per prior EDP plan.    EKG Interpretation  Date/Time:  Tuesday December 13 2017 11:34:00 EDT Ventricular Rate:  52 PR Interval:    QRS Duration: 140 QT Interval:  457 QTC Calculation: 425 R Axis:   -39 Text Interpretation:  Junctional rhythm Right bundle branch block Confirmed by Fredia Sorrow [phone]) on 12/13/2017 11:52:51 AM      Nanda Quinton, MD   Chinmay Squier, [facility], MD 12/14/17 1421

## 2017-12-13 NOTE — Progress Notes (Signed)
Referring Provider: [name], MD Primary Care Physician:  [name], MD Primary GI:  Dr. Oneida Alar  Chief Complaint  Patient presents with  . Constipation    BM QOD  . Abdominal Pain    Right side, lower abd  . Nausea    no vomiting    HPI:   [name] is a 72 y.o. female who presents follow-up on GERD and constipation.  Patient was last seen in our office 09/05/2017 for the same.  EGD last completed 12/14/2016 with mild gastritis and recommended continue Protonix.  No H. pylori.  Single bout of diverticulitis which required hospitalization in September 2017.  Previous he tried and failed MiraLAX, Colace, Amitiza, and others for constipation.  Linzess was effective for her.  Constipation off Linzess due to insurance coverage issues.  Intermittent abdominal pain/fullness.  GERD about 1-2 times a week on Protonix.  No other GI symptoms.  Recommend daily Protonix.  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  Follow-up in 3 months, call if any questions or concerns.  Patient called our office 11/30/2017 stating she was still having difficulty getting Linzess.  She was approved but cannot afford the medication.  Did not qualify for patient assistance due to her husband's income.  Estill Bamberg told her that Linzess would cost $400 a month.  All constipation medications except lactulose arterial 3 and would cost the same.  Today she states she's doing so-so. Is having persistent constipation, bowel movement every other day, noted incomplete emptying. Also with associated abdominal pain and bloating, nausea. No vomiting. Denies hematochezia, melena. Has a decreased appetite. Has lost about 8 lbs over the past 2-3 weeks unintentionally.  Objectively she has lst 15 lbs since her last OV 3 months ago. No abdominal imaging since 2017. Does have intermittent chest tightness, has not discussed with anyone. Sees cardiology (no appointment future shceduled). Denies dyspnea,  dizziness, lightheadedness, syncope, near syncope. Denies any other upper or lower GI symptoms.  Past Medical History:  Diagnosis Date  . Blood clot of artery under arm ([place])   . Cyst on liver  . Depression   . Diabetes mellitus    controlled by diet  . Diverticulitis   . DVT of axillary vein, acute right ([place]) 11/24/2012   Dx on Jan 28, 2012.    . Fibrocystic breast disease   . Hepatic cyst   . Hypertension   . IBS (irritable bowel syndrome)   . Impaired glucose tolerance   . Menopause     Past Surgical History:  Procedure Laterality Date  . ABDOMINAL HYSTERECTOMY    . BACK SURGERY    . BREAST SURGERY  right   cyst removed  . CAST APPLICATION  3/57/0177   Procedure: CAST APPLICATION;  Surgeon: Carole Civil, MD;  Location: [facility] ORS;  Service: Orthopedics;  Laterality: Right;  procedure room   . CHOLECYSTECTOMY    . COLONOSCOPY  12/04/2004   NUR:Few small diverticula at sigmoid colon/Small cecal polyp ablated   . COLONOSCOPY N/A 09/24/2014   SLF: small internal hemorrhoids  . ESOPHAGOGASTRODUODENOSCOPY N/A 02/02/2016   Procedure: ESOPHAGOGASTRODUODENOSCOPY (EGD);  Surgeon: Danie Binder, MD;  Location: [facility] ENDO SUITE;  Service: Endoscopy;  Laterality: N/A;  930   . KNEE ARTHROCENTESIS  left  . TUBAL LIGATION      Current Outpatient Medications  Medication Sig Dispense Refill  . acarbose (PRECOSE) 25 MG tablet Take 1 tablet (25 mg total) by mouth 3 (three)  times daily with meals. 270 tablet 1  . acetaminophen (TYLENOL) 500 MG tablet Take 1,000 mg by mouth every 6 (six) hours as needed for mild pain, moderate pain or headache.     . Alcohol Swabs (B-D SINGLE USE SWABS REGULAR) PADS USE EVERY DAY 100 each 5  . calcium carbonate (OS-CAL - DOSED IN MG OF ELEMENTAL CALCIUM) 1250 (500 Ca) MG tablet Take 1 tablet by mouth.    . Cholecalciferol (VITAMIN D3) 5000 units CAPS Take 5,000 Units by mouth daily.    . clopidogrel (PLAVIX) 75 MG tablet Take 1 tablet (75 mg total) by mouth  daily. 90 tablet 3  . hydrochlorothiazide (HYDRODIURIL) 12.5 MG tablet Take 1 tablet (12.5 mg total) by mouth daily. 90 tablet 1  . HYDROcodone-acetaminophen (NORCO) 10-325 MG tablet Take 1 tablet by mouth every 4 (four) hours as needed. 28 tablet 0  . losartan (COZAAR) 50 MG tablet Take 1 tablet (50 mg total) by mouth daily. 90 tablet 1  . Multiple Vitamins-Minerals (MULTIVITAMIN WITH MINERALS) tablet Take 1 tablet by mouth daily.    . pantoprazole (PROTONIX) 40 MG tablet Take 1 tablet (40 mg total) by mouth daily. 90 tablet 3  . pravastatin (PRAVACHOL) 20 MG tablet Take 1 tablet (20 mg total) by mouth at bedtime. 90 tablet 3  . TRUEPLUS LANCETS 33G MISC TEST EVERY DAY 100 each 5   No current facility-administered medications for this visit.     Allergies as of 12/13/2017 - Review Complete 12/13/2017  Allergen Reaction Noted  . Norvasc [amlodipine besylate] Other (See Comments) 11/21/2012  . Penicillins Hives and Other (See Comments)   . Advair diskus [fluticasone-salmeterol] Palpitations 11/21/2012  . Metformin and related Other (See Comments) 09/28/2013    Family History  Problem Relation Age of Onset  . Stroke Mother   . Heart attack Mother   . Cancer Sister   . Diabetes Sister   . Seizures Brother   . Diabetes Brother   . Heart disease Brother   . Kidney disease Son   . Colon cancer Neg Hx     Social History   Socioeconomic History  . Marital status: Married    Spouse name: Not on file  . Number of children: Not on file  . Years of education: Not on file  . Highest education level: Not on file  Occupational History  . Occupation: Retired  Scientific laboratory technician  . Financial resource strain: Not on file  . Food insecurity:    Worry: Not on file    Inability: Not on file  . Transportation needs:    Medical: Not on file    Non-medical: Not on file  Tobacco Use  . Smoking status: Never Smoker  . Smokeless tobacco: Never Used  . Tobacco comment: Never smoked  Substance  and Sexual Activity  . Alcohol use: No    Alcohol/week: 0.0 oz  . Drug use: No  . Sexual activity: Yes  Lifestyle  . Physical activity:    Days per week: Not on file    Minutes per session: Not on file  . Stress: Not on file  Relationships  . Social connections:    Talks on phone: Not on file    Gets together: Not on file    Attends religious service: Not on file    Active member of club or organization: Not on file    Attends meetings of clubs or organizations: Not on file    Relationship status: Not on file  Other Topics Concern  . Not on file  Social History Narrative  . Not on file    Review of Systems: Complete ROS negative except as per HPI.   Physical Exam: BP (!) 175/76   Pulse (!) 55   Temp (!) 97.1 F (36.2 C) (Oral)   Ht 5' 4"  (1.626 m)   Wt 239 lb 12.8 oz (108.8 kg)   BMI 41.16 kg/m  General:   Alert and oriented. Pleasant and cooperative. Well-nourished and well-developed.  Eyes:  Without icterus, sclera clear and conjunctiva pink.  Ears:  Normal auditory acuity. Cardiovascular:  S1, S2 present without murmurs appreciated. Extremities without clubbing or edema. Respiratory:  Clear to auscultation bilaterally. No wheezes, rales, or rhonchi. No distress.  Gastrointestinal:  +BS, rounded but soft, non-tender and non-distended. No HSM noted. No guarding or rebound. No masses appreciated.  Rectal:  Deferred  Musculoskalatal:  Symmetrical without gross deformities. Neurologic:  Alert and oriented x4;  grossly normal neurologically. Psych:  Alert and cooperative. Normal mood and affect. Heme/Lymph/Immune: No excessive bruising noted.    12/13/2017 9:20 AM   Disclaimer: This note was dictated with voice recognition software. Similar sounding words can inadvertently be transcribed and may not be corrected upon review.

## 2017-12-13 NOTE — Progress Notes (Signed)
cc'ed to pcp °

## 2017-12-13 NOTE — ED Triage Notes (Signed)
Pt states chest "tightness" started Sunday with diaphoresis, N/, lightheaded, and radiation to L arm, neck, and back.

## 2017-12-13 NOTE — ED Provider Notes (Signed)
[facility] Provider Note   CSN: [id number] Arrival date & time: 12/13/17  1121     History   Chief Complaint Chief Complaint  Patient presents with  . Chest Pain    HPI [name] is a 72 y.o. female.  Patient with a complaint of left-sided tightness since Sunday in the chest area with some diaphoresis and some nausea and lightheaded radiation to left arm neck and back made worse by movement of the left arm.  Patient's had some remote history of clot in the past not clear whether it was arterial venous currently on Plavix.  Pain is been constant since Sunday.  No swelling to the arms does have some numbness and tingling feeling to the left hand.  No leg swelling.  No abdominal pain.  In addition patient may have had a history of about 2 months ago of shingles that were on this location.  Patient had a rash her doctor thought that it may be shingles.  That resolved did not have any pain in this area until now.  Did have some when the rash was present.     Past Medical History:  Diagnosis Date  . Blood clot of artery under arm ([place])   . Cyst on liver  . Depression   . Diabetes mellitus    controlled by diet  . Diverticulitis   . DVT of axillary vein, acute right ([place]) 11/24/2012   Dx on Jan 28, 2012.    . Fibrocystic breast disease   . Hepatic cyst   . Hypertension   . IBS (irritable bowel syndrome)   . Impaired glucose tolerance   . Menopause     Patient Active Problem List   Diagnosis Date Noted  . GERD (gastroesophageal reflux disease) 09/05/2017  . Diverticulitis 05/04/2016  . IBS (irritable bowel syndrome) 12/29/2015  . Constipation 02/03/2015  . Dyspepsia   . Loss of weight   . Decreased appetite 09/16/2014  . Unintentional weight loss 09/16/2014  . Diabetes type 2, uncontrolled ([place]) 06/27/2013  . DVT of axillary vein, acute right ([place]) 11/24/2012  . Type 2 diabetes mellitus with diabetic neuropathy, without long-term current  use of insulin ([place]) 11/15/2012  . Chest pain, unspecified 05/10/2011  . Bradycardia 05/10/2011  . HYPERCHOLESTEROLEMIA 08/29/2008  . DEPRESSION 08/29/2008  . Essential hypertension 08/29/2008  . PEPTIC ULCER DISEASE 08/29/2008  . HIATAL HERNIA 08/29/2008  . HEPATIC CYST 08/29/2008  . Osteoarthritis 08/29/2008    Past Surgical History:  Procedure Laterality Date  . ABDOMINAL HYSTERECTOMY    . BACK SURGERY    . BREAST SURGERY  right   cyst removed  . CAST APPLICATION  9/89/2119   Procedure: CAST APPLICATION;  Surgeon: Carole Civil, MD;  Location: [facility] ORS;  Service: Orthopedics;  Laterality: Right;  procedure room   . CHOLECYSTECTOMY    . COLONOSCOPY  12/04/2004   NUR:Few small diverticula at sigmoid colon/Small cecal polyp ablated   . COLONOSCOPY N/A 09/24/2014   SLF: small internal hemorrhoids  . ESOPHAGOGASTRODUODENOSCOPY N/A 02/02/2016   Procedure: ESOPHAGOGASTRODUODENOSCOPY (EGD);  Surgeon: Danie Binder, MD;  Location: [facility];  Service: Endoscopy;  Laterality: N/A;  930   . KNEE ARTHROCENTESIS  left  . TUBAL LIGATION       OB History    Gravida  3   Para  3   Term  3   Preterm      AB      Living  3  SAB      TAB      Ectopic      Multiple      Live Births               Home Medications    Prior to Admission medications   Medication Sig Start Date End Date Taking? Authorizing Provider  acarbose (PRECOSE) 25 MG tablet Take 1 tablet (25 mg total) by mouth 3 (three) times daily with meals. 11/29/17  Yes [name], MD  acetaminophen (TYLENOL) 500 MG tablet Take 1,000 mg by mouth every 6 (six) hours as needed for mild pain, moderate pain or headache.    Yes [provider]  Alcohol Swabs (B-D SINGLE USE SWABS REGULAR) PADS USE EVERY DAY 05/25/17  Yes Luking, Elayne Snare, MD  Cholecalciferol (VITAMIN D3) 5000 units CAPS Take 5,000 Units by mouth daily.   Yes [provider]  clopidogrel (PLAVIX) 75 MG tablet Take 1  tablet (75 mg total) by mouth daily. 11/10/16  Yes Branch, Alphonse Guild, MD  hydrochlorothiazide (HYDRODIURIL) 12.5 MG tablet Take 1 tablet (12.5 mg total) by mouth daily. 11/29/17  Yes [name], MD  HYDROcodone-acetaminophen (NORCO) 10-325 MG tablet Take 1 tablet by mouth every 4 (four) hours as needed. 10/24/17  Yes [name], MD  losartan (COZAAR) 50 MG tablet Take 1 tablet (50 mg total) by mouth daily. 11/01/17 01/30/18 Yes Luking, Elayne Snare, MD  Multiple Vitamins-Minerals (MULTIVITAMIN WITH MINERALS) tablet Take 1 tablet by mouth daily.   Yes [provider]  pantoprazole (PROTONIX) 40 MG tablet Take 1 tablet (40 mg total) by mouth daily. 06/24/17  Yes [name], MD  pravastatin (PRAVACHOL) 20 MG tablet Take 1 tablet (20 mg total) by mouth at bedtime. 06/24/17  Yes [name], MD  TRUEPLUS LANCETS 33G MISC TEST EVERY DAY 05/25/17  Yes [name], MD  ondansetron (ZOFRAN ODT) 4 MG disintegrating tablet Take 1 tablet (4 mg total) by mouth every 8 (eight) hours as needed. 12/13/17   Fredia Sorrow, MD  traMADol (ULTRAM) 50 MG tablet Take 1 tablet (50 mg total) by mouth every 6 (six) hours as needed. 12/13/17   Fredia Sorrow, MD    Family History Family History  Problem Relation Age of Onset  . Stroke Mother   . Heart attack Mother   . Cancer Sister   . Diabetes Sister   . Seizures Brother   . Diabetes Brother   . Heart disease Brother   . Kidney disease Son   . Colon cancer Neg Hx     Social History Social History   Tobacco Use  . Smoking status: Never Smoker  . Smokeless tobacco: Never Used  . Tobacco comment: Never smoked  Substance Use Topics  . Alcohol use: No    Alcohol/week: 0.0 oz  . Drug use: No     Allergies   Norvasc [amlodipine besylate]; Penicillins; Advair diskus [fluticasone-salmeterol]; and Metformin and related   Review of Systems Review of Systems  Constitutional: Positive for diaphoresis. Negative for fever.  HENT:  Negative for congestion.   Eyes: Negative for redness.  Respiratory: Negative for shortness of breath.   Cardiovascular: Positive for chest pain. Negative for leg swelling.  Gastrointestinal: Positive for nausea. Negative for abdominal pain and vomiting.  Genitourinary: Negative for dysuria.  Musculoskeletal: Positive for back pain and neck pain.  Skin: Negative for rash.  Neurological: Positive for light-headedness. Negative for syncope and headaches.  Hematological: Does not  bruise/bleed easily.  Psychiatric/Behavioral: Negative for confusion.     Physical Exam Updated Vital Signs BP (!) 145/68   Pulse 60   Temp 98.2 F (36.8 C) (Oral)   Resp 18   Ht 1.626 m (5\' 4" )   Wt 108.4 kg (239 lb)   SpO2 100%   BMI 41.02 kg/m   Physical Exam  Constitutional: She is oriented to person, place, and time. She appears well-developed and well-nourished. No distress.  HENT:  Head: Normocephalic and atraumatic.  Mouth/Throat: Oropharynx is clear and moist.  Eyes: Pupils are equal, round, and reactive to light. Conjunctivae and EOM are normal.  Neck: Normal range of motion. Neck supple.  Cardiovascular: Normal rate and normal heart sounds.  Pulmonary/Chest: Effort normal and breath sounds normal. She exhibits no tenderness.  Reproducible chest pain with range of motion of the left arm and shoulder but no shoulder pain.  Abdominal: Soft. Bowel sounds are normal. There is no tenderness.  Musculoskeletal: Normal range of motion. She exhibits no edema.  Reproducible chest pain with range of motion of the left shoulder but no shoulder pain no elbow pain.  Radial pulses 2+.  Good sensation to the left hand.  No pain with movement of the neck.  Neurological: She is alert and oriented to person, place, and time. No cranial nerve deficit or sensory deficit. She exhibits normal muscle tone. Coordination normal.  Skin: Skin is warm. No rash noted.  Nursing note and vitals reviewed.    ED  Treatments / Results  Labs (all labs ordered are listed, but only abnormal results are displayed) Labs Reviewed  CBC - Abnormal; Notable for the following components:      Result Value   MCH 24.8 (*)    RDW 16.9 (*)    All other components within normal limits  BASIC METABOLIC PANEL  TROPONIN I    EKG EKG Interpretation  Date/Time:  Tuesday December 13 2017 11:34:00 EDT Ventricular Rate:  52 PR Interval:    QRS Duration: 140 QT Interval:  457 QTC Calculation: 425 R Axis:   -39 Text Interpretation:  Junctional rhythm Right bundle branch block Confirmed by Fredia Sorrow [phone]) on 12/13/2017 11:52:51 AM   Radiology Dg Chest 2 View  Result Date: 12/13/2017 CLINICAL DATA:  Chest pain EXAM: CHEST - 2 VIEW COMPARISON:  10/01/2015 FINDINGS: Normal heart size and mediastinal contours. Atherosclerotic calcification. There is no edema, consolidation, effusion, or pneumothorax. Spondylosis. No acute or aggressive finding. IMPRESSION: No evidence of active disease. Electronically Signed   By: Monte Fantasia M.D.   On: 12/13/2017 12:48    Procedures Procedures (including critical care time)  Medications Ordered in ED Medications  0.9 %  sodium chloride infusion (has no administration in time range)     Initial Impression / Assessment and Plan / ED Course  I have reviewed the triage vital signs and the nursing notes.  Pertinent labs & imaging results that were available during my care of the patient were reviewed by me and considered in my medical decision making (see chart for details).     Patient symptoms seem to be most consistent with chest wall pain.  Because movement of the left arm causes the left anterior chest pain and back pain in the chest area to be reproduced.  However with the radiation from the neck chest and into the left arm is possible there could be radicular pain.  Appears to be no pain being elicited from the shoulder itself.  In addition  with the history of  shingles a couple months ago pain now recurring in the same area there could be a correlation.  No evidence of rash today.  Based on the fact that she had nausea and had some diaphoresis with the pain that it started on "Sunday CT angios chest has been ordered.  Patient's troponin and regular chest x-ray and labs without any acute findings.  EKG did show evidence of right bundle branch block but no acute changes.  Certainly troponin should have been positive with nonstop chest discomfort since Sunday.  Acute coronary event I believe is been ruled out.  Final Clinical Impressions(s) / ED Diagnoses   Final diagnoses:  Chest wall pain    ED Discharge Orders        Ordered    traMADol (ULTRAM) 50 MG tablet  Every 6 hours PRN     12/13/17 1547    ondansetron (ZOFRAN ODT) 4 MG disintegrating tablet  Every 8 hours PRN     04" /16/19 [place], [place], MD 12/13/17 1553

## 2017-12-15 ENCOUNTER — Ambulatory Visit (INDEPENDENT_AMBULATORY_CARE_PROVIDER_SITE_OTHER): Payer: Medicare HMO | Admitting: Family Medicine

## 2017-12-15 ENCOUNTER — Ambulatory Visit: Payer: Medicare HMO | Admitting: Podiatry

## 2017-12-15 ENCOUNTER — Encounter: Payer: Self-pay | Admitting: Family Medicine

## 2017-12-15 ENCOUNTER — Telehealth: Payer: Self-pay | Admitting: Family Medicine

## 2017-12-15 VITALS — BP 138/88 | Ht 64.0 in | Wt 240.0 lb

## 2017-12-15 DIAGNOSIS — R0789 Other chest pain: Secondary | ICD-10-CM

## 2017-12-15 NOTE — Telephone Encounter (Signed)
I would recommend discontinuing Precose because of the cost.  I recommend glipizide 2.5 mg XL 1 daily, #90 with 1 refill.  Patient needs to periodically check her sugars in the morning and periodically check her sugars before supper there is no need to check it more than once per day.  She needs to keep these on a written list.  She needs a send notes to [place] in approximately 2 to 3 weeks.  Please make sure the patient understands it is important while on this medicine not to skip meals.  She does not have to over eat but she needs to make sure that she does not skip meals.  If she has low sugar spells-such as nervousness sweating trembling she may need to eat a snack.  If she is having frequent low sugar spells she needs to let us know.  Hopefully this low dose of glipizide will help her sugars stay within a reasonable range and avoid low sugar spells and avoid excessively high spells.  The cost of this medicine should be much more affordable if not please notify us-you may send this to her Medicare prescription pharmacy once you speak with the patient thank you also the patient should make sure she has a regular follow-up visit for her health check either later this spring or early summer

## 2017-12-15 NOTE — Telephone Encounter (Signed)
Upon checking into this situation apparently the co-pay is still $100 every 3 months patient states she cannot afford that so we will do our best to come up with a less expensive medication for her diabetes

## 2017-12-15 NOTE — Progress Notes (Signed)
   Subjective:    Patient ID: [name], female    DOB: 13-Jan-1946, 72 y.o.   MRN: [id number]  HPI Pt here for follow up from ER visit on 12/13/17 for chest pain. Chest pain has subsided; pt states its intermittent. Pt states that her left breast is hurting; started yesterday. Pt states breast pain does radiate to left side of upper back.   Pt states [employer] has not sent Acarbose out yet; was ordered on 11/28/17 ER records were reviewed in detail including scanned lab work and the ER doctor note with the patient  Review of Systems  Constitutional: Negative for activity change and appetite change.  HENT: Negative for congestion.   Respiratory: Negative for cough and shortness of breath.   Cardiovascular: Positive for chest pain. Negative for leg swelling.  Gastrointestinal: Positive for constipation. Negative for abdominal pain, diarrhea and vomiting.  Skin: Negative for color change.  Neurological: Negative for weakness.  Psychiatric/Behavioral: Negative for confusion.       Objective:   Physical Exam Neck no masses lungs clear respiratory rate normal heart is regular pulse normal BP is good I recheck blood pressure after patient was sitting for a span of time and it was good Chest wall tenderness and pain       Assessment & Plan:  Chest wall pain I do not find any evidence of cardiac disease Should gradually get better I would recommend patient follow-up if any ongoing troubles or if issues otherwise keep all regular follow-up visits  Patient under a lot of stress this is causing her a lot of her additional issues  She may have insurance company not doing a good job sending her medication they stated that they were proving hopefully they will see

## 2017-12-16 NOTE — Telephone Encounter (Signed)
I called and left a message to r/c. 

## 2017-12-16 NOTE — Telephone Encounter (Signed)
May stick with precose

## 2017-12-16 NOTE — Telephone Encounter (Signed)
Patient stated that the copay on Precose lowered back to a tier 2 and she can afford it and it was mailed out yesterday and she will be delivered in 5-7 days

## 2018-01-02 ENCOUNTER — Ambulatory Visit (HOSPITAL_COMMUNITY)
Admission: RE | Admit: 2018-01-02 | Discharge: 2018-01-02 | Disposition: A | Discharge status: Home / Self Care | Payer: Medicare HMO | Source: Ambulatory Visit | Attending: Family Medicine | Admitting: Family Medicine

## 2018-01-02 ENCOUNTER — Encounter (HOSPITAL_COMMUNITY): Payer: Self-pay

## 2018-01-02 DIAGNOSIS — Z1231 Encounter for screening mammogram for malignant neoplasm of breast: Secondary | ICD-10-CM | POA: Insufficient documentation

## 2018-01-02 DIAGNOSIS — E1140 Type 2 diabetes mellitus with diabetic neuropathy, unspecified: Secondary | ICD-10-CM | POA: Diagnosis not present

## 2018-01-02 DIAGNOSIS — Z79899 Other long term (current) drug therapy: Secondary | ICD-10-CM | POA: Diagnosis not present

## 2018-01-02 DIAGNOSIS — Z1159 Encounter for screening for other viral diseases: Secondary | ICD-10-CM | POA: Diagnosis not present

## 2018-01-02 DIAGNOSIS — Z13220 Encounter for screening for lipoid disorders: Secondary | ICD-10-CM | POA: Diagnosis not present

## 2018-01-03 ENCOUNTER — Other Ambulatory Visit: Payer: Self-pay | Admitting: Podiatry

## 2018-01-03 ENCOUNTER — Ambulatory Visit (INDEPENDENT_AMBULATORY_CARE_PROVIDER_SITE_OTHER): Payer: Medicare HMO | Admitting: Podiatry

## 2018-01-03 ENCOUNTER — Other Ambulatory Visit: Payer: Self-pay

## 2018-01-03 ENCOUNTER — Encounter: Payer: Self-pay | Admitting: Podiatry

## 2018-01-03 ENCOUNTER — Telehealth: Payer: Self-pay | Admitting: Podiatry

## 2018-01-03 ENCOUNTER — Ambulatory Visit (INDEPENDENT_AMBULATORY_CARE_PROVIDER_SITE_OTHER): Payer: Medicare HMO

## 2018-01-03 VITALS — BP 144/66 | HR 55

## 2018-01-03 DIAGNOSIS — M7751 Other enthesopathy of right foot: Secondary | ICD-10-CM

## 2018-01-03 DIAGNOSIS — M79672 Pain in left foot: Secondary | ICD-10-CM

## 2018-01-03 DIAGNOSIS — M79671 Pain in right foot: Secondary | ICD-10-CM | POA: Diagnosis not present

## 2018-01-03 DIAGNOSIS — E1142 Type 2 diabetes mellitus with diabetic polyneuropathy: Secondary | ICD-10-CM

## 2018-01-03 DIAGNOSIS — Z0120 Encounter for dental examination and cleaning without abnormal findings: Secondary | ICD-10-CM | POA: Insufficient documentation

## 2018-01-03 LAB — HEPATIC FUNCTION PANEL
ALBUMIN: 4.1 g/dL (ref 3.6–4.8)
ALK PHOS: 72 IU/L (ref 39–117)
ALT: 20 IU/L (ref 0–32)
AST: 17 IU/L (ref 0–40)
BILIRUBIN TOTAL: 0.4 mg/dL (ref 0.0–1.2)
Bilirubin, Direct: 0.12 mg/dL (ref 0.00–0.40)
Total Protein: 6.5 g/dL (ref 6.0–8.5)

## 2018-01-03 LAB — HEPATITIS C ANTIBODY

## 2018-01-03 LAB — LIPID PANEL
CHOL/HDL RATIO: 2.9 ratio (ref 0.0–4.4)
Cholesterol, Total: 135 mg/dL (ref 100–199)
HDL: 46 mg/dL (ref >39)
LDL Calculated: 75 mg/dL (ref 0–99)
Triglycerides: 70 mg/dL (ref 0–149)
VLDL Cholesterol Cal: 14 mg/dL (ref 5–40)

## 2018-01-03 LAB — HEMOGLOBIN A1C
ESTIMATED AVERAGE GLUCOSE: 148 mg/dL
HEMOGLOBIN A1C: 6.8 % — AB (ref 4.8–5.6)

## 2018-01-03 MED ORDER — GABAPENTIN 100 MG PO CAPS: 100.0000 mg | ORAL_CAPSULE | Freq: Two times a day (BID) | ORAL | 3 refills | Status: DC

## 2018-01-03 NOTE — Telephone Encounter (Signed)
lvm for pt to call me to get scheduled for diabetic shoe measurements. If she would like and does not mind waiting we can do it on 6.4.19 when she is scheduled to see Dr Lemmie Evens for a 1 month follow up.

## 2018-01-03 NOTE — Progress Notes (Signed)
Subjective:  Patient ID: [name], female    DOB: 01-29-1946,  MRN: [id number] HPI Chief Complaint  Patient presents with  . Nail Problem    right foot great toenail; pt stated, "nail is loose and has been lifting for about 3wks; toes have been numb on bottom for about 51mo"  . Foot Pain    right foot lateral side foot pain; pt stated, "hard to walk sometimes; foot pain been going on for about 47mo now; primary dr told me i had neuropathy; foot is throbbing right now (6/10)" (type 2 diabetic: sugar did not take this am; A1C-7.5 per pt)    72 y.o. female presents with the above complaint.   Review of systems: Denies fever chills nausea vomiting muscle aches pain back pain calf pain chest pain shortness of breath and headache.  Past Medical History:  Diagnosis Date  . Blood clot of artery under arm ([place])   . Cyst on liver  . Depression   . Diabetes mellitus    controlled by diet  . Diverticulitis   . DVT of axillary vein, acute right ([place]) 11/24/2012   Dx on Jan 28, 2012.    . Fibrocystic breast disease   . Hepatic cyst   . Hypertension   . IBS (irritable bowel syndrome)   . Impaired glucose tolerance   . Menopause    Past Surgical History:  Procedure Laterality Date  . ABDOMINAL HYSTERECTOMY    . BACK SURGERY    . BREAST SURGERY  right   cyst removed  . CAST APPLICATION  7/67/3419   Procedure: CAST APPLICATION;  Surgeon: Carole Civil, MD;  Location: [facility];  Service: Orthopedics;  Laterality: Right;  procedure room   . CHOLECYSTECTOMY    . COLONOSCOPY  12/04/2004   NUR:Few small diverticula at sigmoid colon/Small cecal polyp ablated   . COLONOSCOPY N/A 09/24/2014   SLF: small internal hemorrhoids  . ESOPHAGOGASTRODUODENOSCOPY N/A 02/02/2016   Procedure: ESOPHAGOGASTRODUODENOSCOPY (EGD);  Surgeon: Danie Binder, MD;  Location: [facility];  Service: Endoscopy;  Laterality: N/A;  930   . KNEE ARTHROCENTESIS  left  . TUBAL LIGATION      Current  Outpatient Medications:  .  acarbose (PRECOSE) 25 MG tablet, Take 1 tablet (25 mg total) by mouth 3 (three) times daily with meals., Disp: 270 tablet, Rfl: 1 .  acetaminophen (TYLENOL) 500 MG tablet, Take 1,000 mg by mouth every 6 (six) hours as needed for mild pain, moderate pain or headache. , Disp: , Rfl:  .  Alcohol Swabs (B-D SINGLE USE SWABS REGULAR) PADS, USE EVERY DAY, Disp: 100 each, Rfl: 5 .  Ca Phosphate-Cholecalciferol (CALCIUM/VITAMIN D3 GUMMIES) 250-350 MG-UNIT CHEW, , Disp: , Rfl:  .  Cholecalciferol (VITAMIN D3) 5000 units CAPS, Take 5,000 Units by mouth daily., Disp: , Rfl:  .  clopidogrel (PLAVIX) 75 MG tablet, Take 1 tablet (75 mg total) by mouth daily., Disp: 90 tablet, Rfl: 3 .  diphenhydrAMINE (BENADRYL) 25 mg capsule, Take by mouth., Disp: , Rfl:  .  glipiZIDE (GLUCOTROL) 5 MG tablet, Take by mouth., Disp: , Rfl:  .  hydrochlorothiazide (HYDRODIURIL) 12.5 MG tablet, Take 1 tablet (12.5 mg total) by mouth daily., Disp: 90 tablet, Rfl: 1 .  HYDROcodone-acetaminophen (NORCO) 10-325 MG tablet, Take 1 tablet by mouth every 4 (four) hours as needed., Disp: 28 tablet, Rfl: 0 .  linaclotide (LINZESS) 145 MCG CAPS capsule, Take by mouth., Disp: , Rfl:  .  losartan (COZAAR) 50 MG  tablet, Take 1 tablet (50 mg total) by mouth daily., Disp: 90 tablet, Rfl: 1 .  Multiple Vitamins-Minerals (MULTIVITAMIN WITH MINERALS) tablet, Take 1 tablet by mouth daily., Disp: , Rfl:  .  ondansetron (ZOFRAN ODT) 4 MG disintegrating tablet, Take 1 tablet (4 mg total) by mouth every 8 (eight) hours as needed., Disp: 10 tablet, Rfl: 1 .  pantoprazole (PROTONIX) 40 MG tablet, Take 1 tablet (40 mg total) by mouth daily., Disp: 90 tablet, Rfl: 3 .  POT BICARB-POT CHLORIDE,25MEQ, (EFFERVESCENT POT CHLORIDE, 25,) 25 MEQ TBEF, take 1 tablet by oral route 2 times every day dissolved in 3 to 4 ounces (1/2 cup) of water with meals, Disp: , Rfl:  .  pravastatin (PRAVACHOL) 20 MG tablet, Take 1 tablet (20 mg total) by  mouth at bedtime., Disp: 90 tablet, Rfl: 3 .  traMADol (ULTRAM) 50 MG tablet, Take 1 tablet (50 mg total) by mouth every 6 (six) hours as needed., Disp: 15 tablet, Rfl: 0 .  TRUEPLUS LANCETS 33G MISC, TEST EVERY DAY, Disp: 100 each, Rfl: 5 .  dicyclomine (BENTYL) 20 MG tablet, Take by mouth., Disp: , Rfl:  .  gabapentin (NEURONTIN) 100 MG capsule, Take 1 capsule (100 mg total) by mouth 2 (two) times daily., Disp: 90 capsule, Rfl: 3  Allergies  Allergen Reactions  . Norvasc [Amlodipine Besylate] Other (See Comments)    Reaction:  Fatigue   . Penicillins Hives and Other (See Comments)    Has patient had a PCN reaction causing immediate rash, facial/tongue/throat swelling, SOB or lightheadedness with hypotension: Yes Has patient had a PCN reaction causing severe rash involving mucus membranes or skin necrosis: No Has patient had a PCN reaction that required hospitalization No Has patient had a PCN reaction occurring within the last 10 years: No If all of the above answers are "NO", then may proceed with Cephalosporin use.   . Advair Diskus [Fluticasone-Salmeterol] Palpitations  . Metformin And Related Other (See Comments)    Reaction:  GI upset    Review of Systems Objective:   Vitals:   01/03/18 1035  BP: (!) 144/66  Pulse: (!) 55    General: Well developed, nourished, in no acute distress, alert and oriented x3   Dermatological: Skin is warm, dry and supple bilateral. Nails x 10 are well maintained; remaining integument appears unremarkable at this time. There are no open sores, no preulcerative lesions, no rash or signs of infection present.  Hallux nail right does demonstrate some distal Onikul lysis but the nail is not overly loose and the nail appears to be of normal thickness.  Vascular: Dorsalis Pedis artery and Posterior Tibial artery pedal pulses are 2/4 bilateral with immedate capillary fill time. Pedal hair growth present. No varicosities and no lower extremity edema  present bilateral.   Neruologic: Grossly intact via light touch bilateral. Vibratory intact via tuning fork bilateral. Protective threshold is lost Semmes Wienstein monofilament to all pedal sites bilateral. Patellar and Achilles deep tendon reflexes 2+ bilateral. No Babinski or clonus noted bilateral.  Peripheral neuropathy plantar aspect of the bilateral foot with loss of protection per Semmes Weinstein monofilament entire plantar aspect of the foot and loss of proprioceptive fibers.  She can still feel deep pain however.  Musculoskeletal: No gross boney pedal deformities bilateral. No pain, crepitus, or limitation noted with foot and ankle range of motion bilateral. Muscular strength 5/5 in all groups tested bilateral.  She has pain on palpation of the sinus tarsi right.  She has pain  on end range of motion of the subtalar joint right foot.  Gait: Unassisted, Nonantalgic.    Radiographs:  No acute findings.  Assessment & Plan:   Assessment: Diabetes with diabetic peripheral neuropathy.  Capsulitis subtalar joint right.  Plan: Discussed etiology pathology conservative versus surgical therapies.  At this point after sterile Betadine skin prep injected 20 mg Kenalog 5 mg Marcaine to the point of maximal tenderness and sinus tarsi of the right foot.  Tolerated procedure well without complications.  Started her on gabapentin 100 mg twice a day once in the morning once at night.  I will follow-up with her in 1 month we did discuss the side effects and benefits of the medication.  She understands this is amenable to it.     Vonzella Althaus T. [place], [place]

## 2018-01-18 ENCOUNTER — Ambulatory Visit (INDEPENDENT_AMBULATORY_CARE_PROVIDER_SITE_OTHER): Payer: Medicare HMO | Admitting: Family Medicine

## 2018-01-18 ENCOUNTER — Encounter: Payer: Self-pay | Admitting: Family Medicine

## 2018-01-18 VITALS — BP 132/84 | Temp 98.3°F | Ht 64.5 in | Wt 238.0 lb

## 2018-01-18 DIAGNOSIS — Z0001 Encounter for general adult medical examination with abnormal findings: Secondary | ICD-10-CM

## 2018-01-18 DIAGNOSIS — E1140 Type 2 diabetes mellitus with diabetic neuropathy, unspecified: Secondary | ICD-10-CM | POA: Diagnosis not present

## 2018-01-18 DIAGNOSIS — I10 Essential (primary) hypertension: Secondary | ICD-10-CM | POA: Diagnosis not present

## 2018-01-18 DIAGNOSIS — Z0000 Encounter for general adult medical examination without abnormal findings: Secondary | ICD-10-CM

## 2018-01-18 NOTE — Progress Notes (Signed)
Subjective:    Patient ID: [name], female    DOB: 1946/02/15, 72 y.o.   MRN: [id number]  HPI AWV- Annual Wellness Visit  The patient was seen for their annual wellness visit. The patient's past medical history, surgical history, and family history were reviewed. Pertinent vaccines were reviewed ( tetanus, pneumonia, shingles, flu) The patient's medication list was reviewed and updated.  The height and weight were entered.  BMI recorded in electronic record elsewhere  Cognitive screening was completed. Outcome of Mini - Cog: pass   Falls /depression screening electronically recorded within record elsewhere  Current tobacco usage: none (All patients who use tobacco were given written and verbal information on quitting)  Recent listing of emergency department/hospitalizations over the past year were reviewed.  current specialist the patient sees on a regular basis:  Dr. Oneida Alar, Dr. Bonnye Fava annual wellness visit patient questionnaire was reviewed.  A written screening schedule for the patient for the next 5-10 years was given. Appropriate discussion of followup regarding next visit was discussed.  Right knee gives out when walking. Started awhile ago.   Right foot neuropathy. Prescribed gabapentin but never started because of what she read on it.  We talked at length about gabapentin she is willing to try it and gradually titrate onto it she understands the principal of the medicine now  Nausea every morning. Started 2 weeks ago.  She describes this as intermittent left upper quadrant area burning sometimes nagging pain sometimes nausea denies any injury to it.  The patient was seen today as part of a comprehensive diabetic check up.The patient relates medication compliance. No significant side effects to the medications. Denies any low glucose spells. Relates compliance with diet to a reasonable level. Patient does do labwork intermittently and understands  the dangers of diabetes.  Patient is frustrated by her weight we talked about healthy eating talked about restriction of calories even talked about the possibility of trying a different medication she does not want to try different medicine at this point      Review of Systems  Constitutional: Negative for activity change, appetite change and fatigue.  HENT: Negative for congestion and rhinorrhea.   Respiratory: Negative for cough and shortness of breath.   Cardiovascular: Negative for chest pain and leg swelling.  Gastrointestinal: Negative for abdominal pain, constipation and diarrhea.  Endocrine: Negative for polydipsia and polyphagia.  Skin: Negative for color change.  Neurological: Negative for weakness.  Psychiatric/Behavioral: Negative for agitation and confusion.       Objective:   Physical Exam  Constitutional: She appears well-nourished. No distress.  HENT:  Head: Normocephalic and atraumatic.  Eyes: Right eye exhibits no discharge. Left eye exhibits no discharge.  Neck: No tracheal deviation present.  Cardiovascular: Normal rate, regular rhythm and normal heart sounds.  No murmur heard. Pulmonary/Chest: Effort normal and breath sounds normal. No respiratory distress.  Abdominal: Soft. She exhibits no distension. There is no tenderness.  Musculoskeletal: She exhibits no edema.  Lymphadenopathy:    She has no cervical adenopathy.  Neurological: She is alert. She exhibits normal muscle tone.  Psychiatric: Her behavior is normal.  Vitals reviewed.         Assessment & Plan:  Adult wellness-complete.wellness physical was conducted today. Importance of diet and exercise were discussed in detail.  In addition to this a discussion regarding safety was also covered. We also reviewed over immunizations and gave recommendations regarding current immunization needed for age.  In addition to  this additional areas were also touched on including: Preventative health exams  needed: Colonoscopy 2021  Patient was advised yearly wellness exam  The patient was seen today as part of a comprehensive visit for diabetes. The importance of keeping her A1c at or below 7 was discussed.  Importance of regular physical activity was discussed.   The importance of adherence to medication as well as a controlled low starch/sugar diet was also discussed.  Standard follow-up visit recommended.  Finally failure to follow good diabetic measures including self effort and compliance with recommendations can certainly increase the risk of heart disease strokes kidney failure blindness loss of limb and early death was discussed with the patient.  Moderate obesity patient was encouraged to watch diet restrict calories continue to exercise try to lose weight follow-up with him 4 to 6 months

## 2018-01-18 NOTE — Patient Instructions (Signed)
Results for orders placed or performed during the hospital encounter of 88/67/73  Basic metabolic panel  Result Value Ref Range   Sodium 139 135 - 145 mmol/L   Potassium 4.1 3.5 - 5.1 mmol/L   Chloride 104 101 - 111 mmol/L   CO2 25 22 - 32 mmol/L   Glucose, Bld 94 65 - 99 mg/dL   BUN 19 6 - 20 mg/dL   Creatinine, Ser 0.84 0.44 - 1.00 mg/dL   Calcium 9.1 8.9 - 10.3 mg/dL   GFR calc non Af Amer >60 >60 mL/min   GFR calc Af Amer >60 >60 mL/min   Anion gap 10 5 - 15  CBC  Result Value Ref Range   WBC 5.4 4.0 - 10.5 K/uL   RBC 5.04 3.87 - 5.11 MIL/uL   Hemoglobin 12.5 12.0 - 15.0 g/dL   HCT 40.6 36.0 - 46.0 %   MCV 80.6 78.0 - 100.0 fL   MCH 24.8 (L) 26.0 - 34.0 pg   MCHC 30.8 30.0 - 36.0 g/dL   RDW 16.9 (H) 11.5 - 15.5 %   Platelets 266 150 - 400 K/uL  Troponin I  Result Value Ref Range   Troponin I <0.03 <0.03 ng/mL

## 2018-01-25 DIAGNOSIS — H524 Presbyopia: Secondary | ICD-10-CM | POA: Diagnosis not present

## 2018-01-25 DIAGNOSIS — E119 Type 2 diabetes mellitus without complications: Secondary | ICD-10-CM | POA: Diagnosis not present

## 2018-01-30 ENCOUNTER — Ambulatory Visit: Payer: Medicare HMO | Admitting: Family Medicine

## 2018-01-31 ENCOUNTER — Ambulatory Visit (INDEPENDENT_AMBULATORY_CARE_PROVIDER_SITE_OTHER): Payer: Medicare HMO | Admitting: Podiatry

## 2018-01-31 ENCOUNTER — Ambulatory Visit: Payer: Medicare HMO

## 2018-01-31 ENCOUNTER — Encounter: Payer: Self-pay | Admitting: Podiatry

## 2018-01-31 DIAGNOSIS — M79676 Pain in unspecified toe(s): Secondary | ICD-10-CM | POA: Diagnosis not present

## 2018-01-31 DIAGNOSIS — B351 Tinea unguium: Secondary | ICD-10-CM

## 2018-01-31 DIAGNOSIS — E1142 Type 2 diabetes mellitus with diabetic polyneuropathy: Secondary | ICD-10-CM

## 2018-01-31 MED ORDER — GABAPENTIN 100 MG PO CAPS: 200.0000 mg | ORAL_CAPSULE | Freq: Every day | ORAL | 3 refills | Status: DC

## 2018-01-31 NOTE — Progress Notes (Signed)
Presents today for bilateral follow-up states that is doing good been taking gabapentin but it makes her sleepy and dizzy during the daytime she is wondering if she could just do 200 mg at bedtime.  Objective: Vital signs are stable alert and oriented x3 pulses are strongly palpable skin demonstrates no open lesions or wounds.  Her toenails are long thick yellow dystrophic-like mycotic painful palpation as well as debridement.  No change in neurovascular status.  Assessment: Pain in limb secondary to diabetic peripheral neuropathy and onychomycosis.  Plan: Debridement of toenails 1 through 5 bilateral.  She will start taking 200 mg of gabapentin at bedtime discontinue the morning dose.  Follow-up with her in 1 year

## 2018-02-14 ENCOUNTER — Other Ambulatory Visit: Payer: Self-pay | Admitting: Cardiology

## 2018-02-24 IMAGING — CT CT ABD-PELV W/ CM
2 of 5 series · 16 of 46 positions shown, 18 images · IV contrast (Omnipaque 300)
Comparison: CT scan of June 21, 2014.

CLINICAL DATA: Acute generalized abdominal pain.

EXAM:
CT ABDOMEN AND PELVIS WITH CONTRAST
TECHNIQUE: Multidetector CT imaging of the abdomen and pelvis was performed
using the standard protocol following bolus administration of
intravenous contrast.
CONTRAST:  1 6EL6H1-E99 IOPAMIDOL (6EL6H1-E99) INJECTION 61%

[Series 2: abd_pel_with 5.0 b40f · axial · 0.93mm/px · z∈[-424,+21]mm · 13 of 101 slices shown, 15 images]
[im 6/101  soft-tissue]
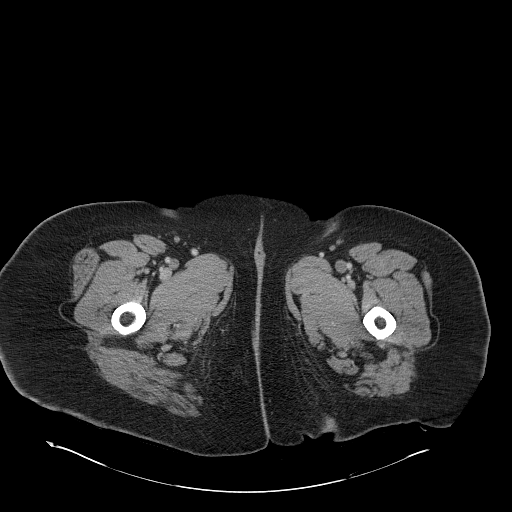
[im 6/101  bone]
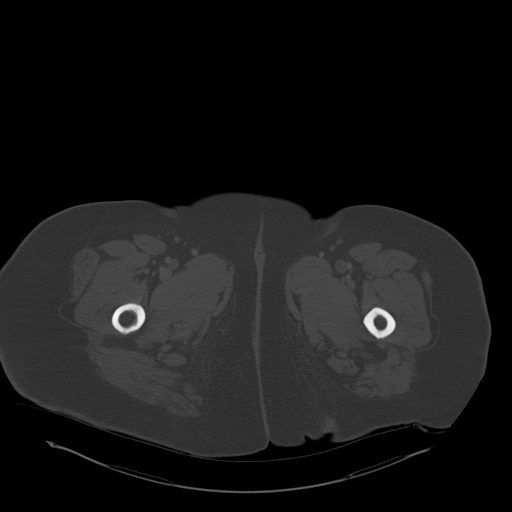
[im 12/101  soft-tissue]
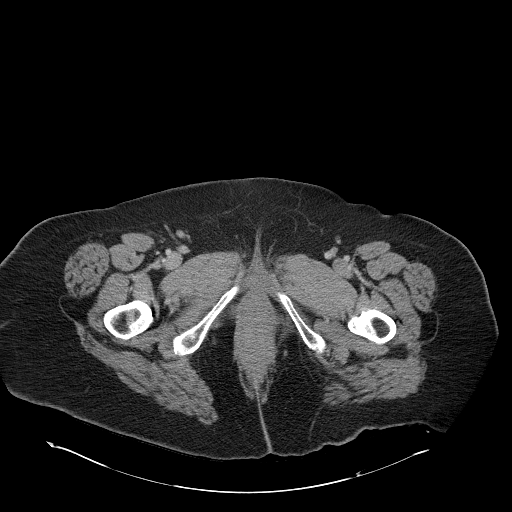
[im 23/101  soft-tissue]
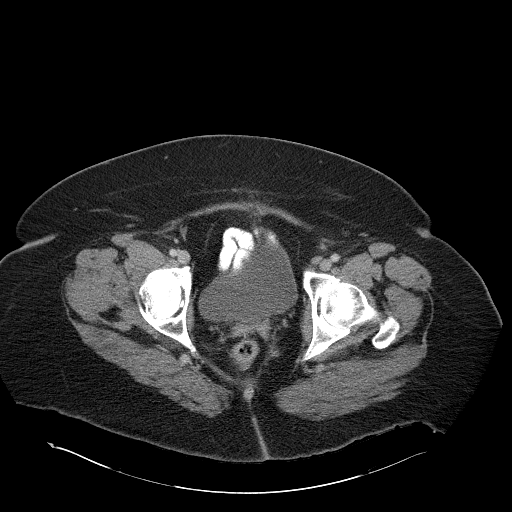
[im 28/101  soft-tissue]
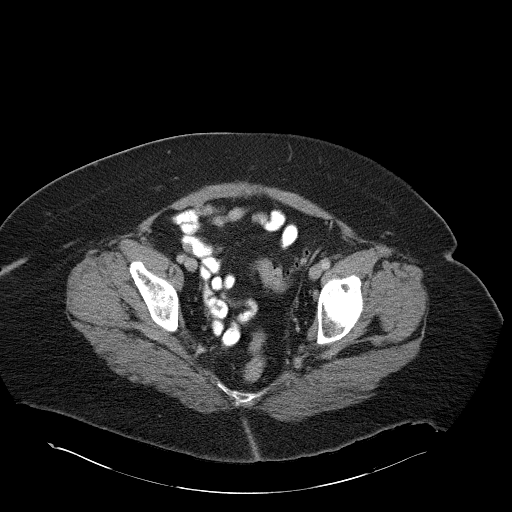
[im 34/101  soft-tissue]
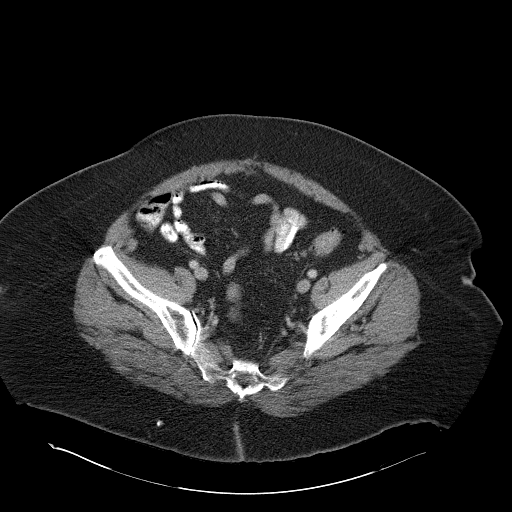
[im 45/101  soft-tissue]
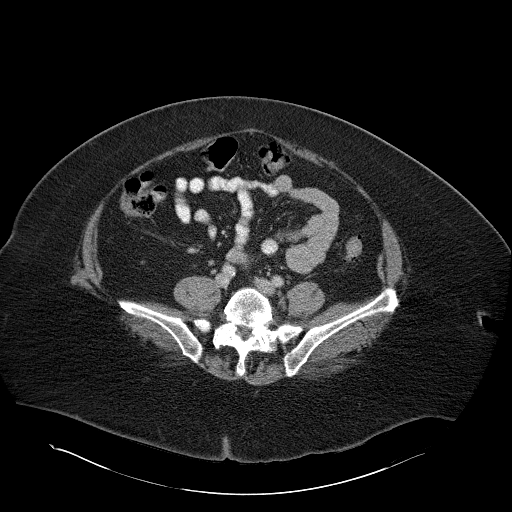
[im 51/101  soft-tissue]
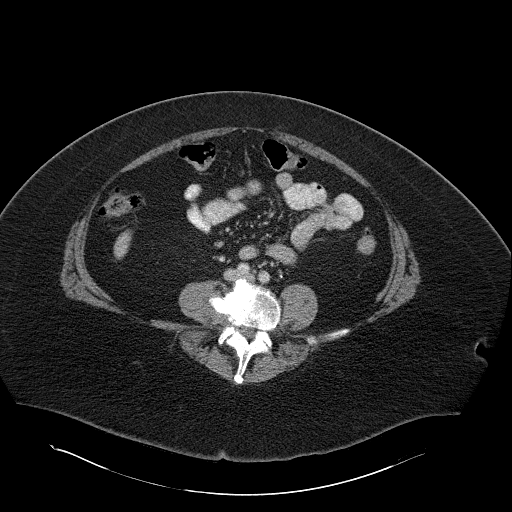
[im 56/101  soft-tissue]
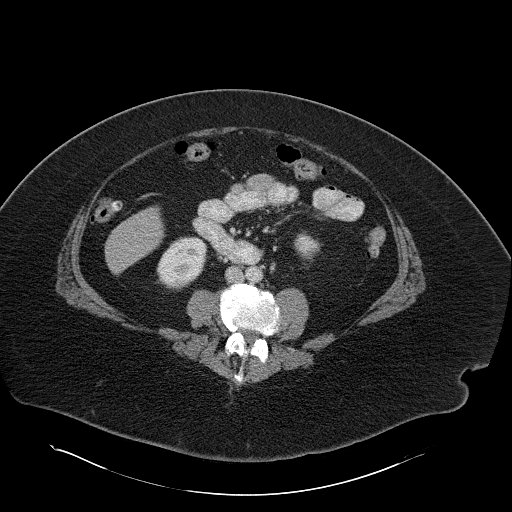
[im 67/101  soft-tissue]
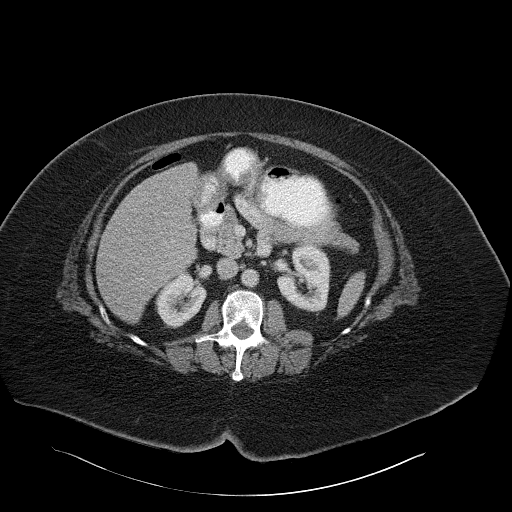
[im 67/101  bone]
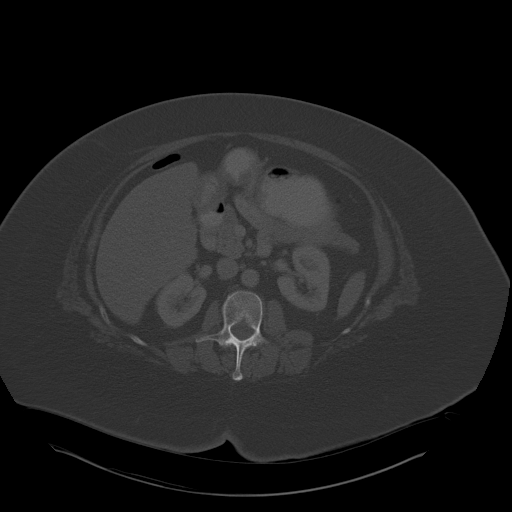
[im 73/101  soft-tissue]
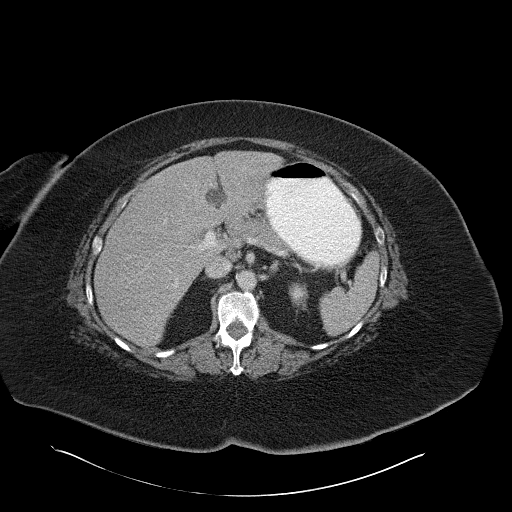
[im 78/101  soft-tissue]
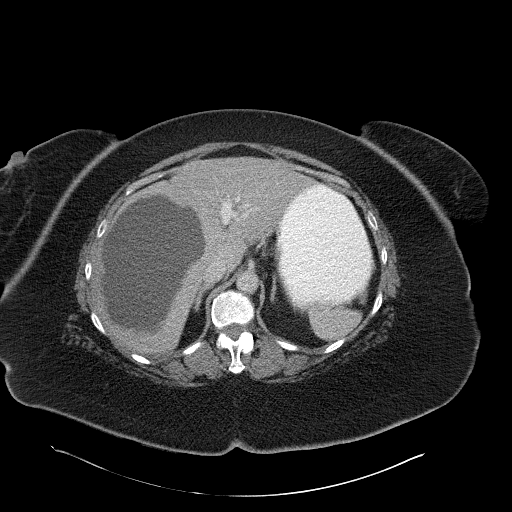
[im 89/101  soft-tissue]
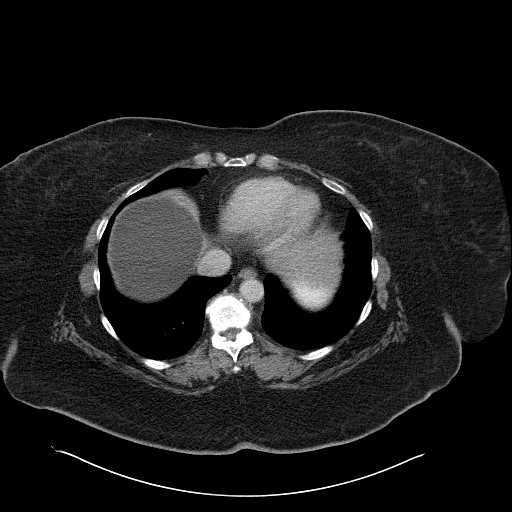
[im 95/101  soft-tissue]
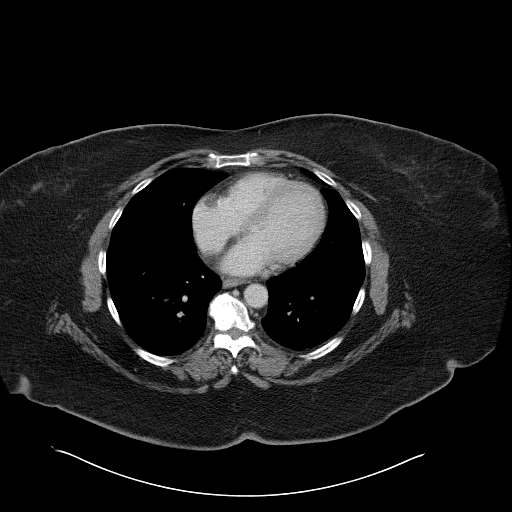

[Series 3: abd_pel_with 3.0 spo cor · coronal · 0.81mm/px · 3 of 110 slices shown]
[im 37/110  soft-tissue]
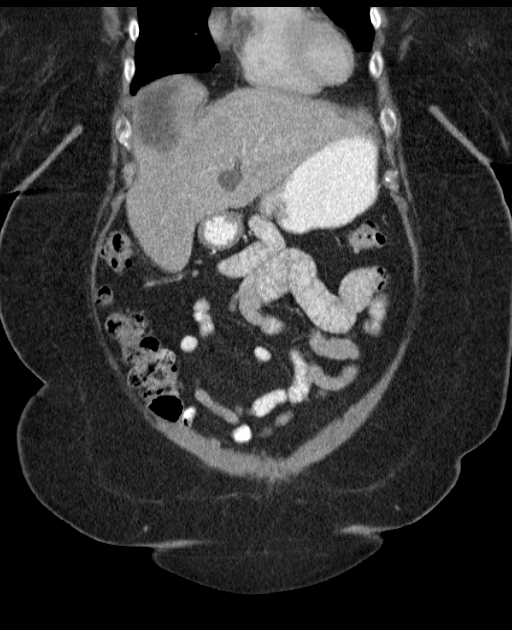
[im 49/110  soft-tissue]
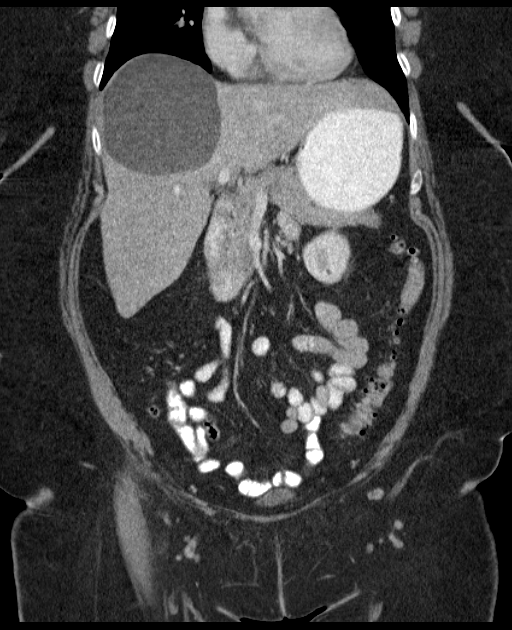
[im 61/110  soft-tissue]
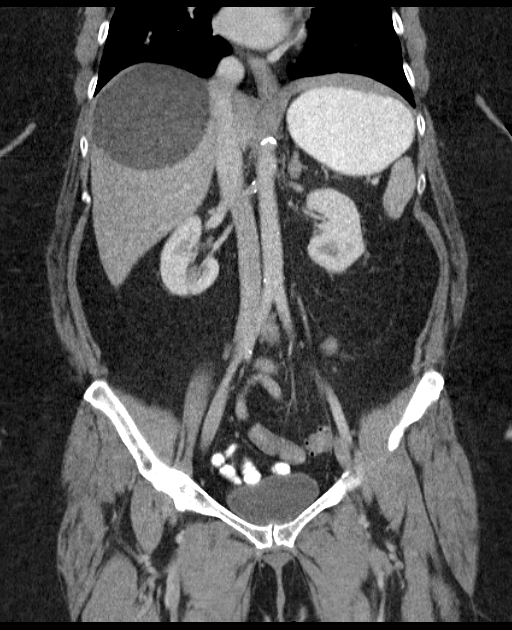

[16 of 46 positions shown; findings below may reference images not displayed]

FINDINGS: Multilevel degenerative disc disease is noted in the lumbar spine.
Visualized lung bases are unremarkable.

Status post cholecystectomy. Stable hepatic cysts are noted
including dominant cyst in right hepatic lobe. The spleen and
pancreas are unremarkable. Adrenal glands and kidneys appear normal.
No hydronephrosis or renal obstruction is noted. Nonobstructive
calculus is seen in lower pole collecting system of left kidney. No
ureteral calculi are noted. The appendix appears normal. There is no
evidence of bowel obstruction. Diverticulosis of descending and
sigmoid colon is noted without inflammation. No abnormal fluid
collection is noted. Urinary bladder appears normal. Status post
hysterectomy. Ovaries are not clearly visualized. No significant
adenopathy is noted.
IMPRESSION: Stable hepatic cysts.

Nonobstructive left renal calculus. No hydronephrosis or renal
obstruction is noted.

Sigmoid and descending diverticulosis is noted without inflammation.

No other significant abnormality seen in the abdomen or pelvis.

## 2018-02-28 ENCOUNTER — Ambulatory Visit: Payer: Medicare HMO | Admitting: Nurse Practitioner

## 2018-03-13 ENCOUNTER — Ambulatory Visit (INDEPENDENT_AMBULATORY_CARE_PROVIDER_SITE_OTHER): Payer: Medicare HMO | Admitting: Nurse Practitioner

## 2018-03-13 ENCOUNTER — Encounter: Payer: Self-pay | Admitting: Nurse Practitioner

## 2018-03-13 VITALS — BP 151/72 | HR 49 | Temp 97.2°F | Ht 65.0 in | Wt 241.4 lb

## 2018-03-13 DIAGNOSIS — K2100 Gastro-esophageal reflux disease with esophagitis, without bleeding: Secondary | ICD-10-CM

## 2018-03-13 DIAGNOSIS — K5900 Constipation, unspecified: Secondary | ICD-10-CM

## 2018-03-13 DIAGNOSIS — K589 Irritable bowel syndrome without diarrhea: Secondary | ICD-10-CM

## 2018-03-13 NOTE — Progress Notes (Signed)
CC'ED TO PCP 

## 2018-03-13 NOTE — Assessment & Plan Note (Addendum)
IBS symptoms are persistent.  The patient has been unable to obtain Linzess due to exorbitant cost by insurance company at $400 for 90 days supply.  She states that she has multiple other medications that her insurance company does not pay much for and her medications are costing her more than she can afford.  She is considering changing insurances at renewal if she is unable to obtain her medications.  I will attempt to give her 1 to 2 boxes of samples to help her get over her current "bump" of symptoms.  I explained that we are unable to supply her medication through samples and she understands.  Recommended she try over-the-counter laxatives in the pharmacy.  Unfortunately, the only medication for constipation her insurance company would cover at a lower tier is lactulose.  She is unable to tolerate due to the thick, sweet nature.  She feels that this would worsen her diabetes as well.  Return for follow-up in 6 months as we may have better luck treating her constipation with prescriptive management after the beginning of the year.

## 2018-03-13 NOTE — Assessment & Plan Note (Signed)
GERD doing well on PPI.  Continue current medications, follow-up in 6 months.

## 2018-03-13 NOTE — Progress Notes (Signed)
Referring Provider: [name], MD Primary Care Physician:  [name], MD Primary GI:  Dr. Oneida Alar  Chief Complaint  Patient presents with  . Constipation  . Gastroesophageal Reflux  . Abdominal Pain    HPI:   [name] is a 72 y.o. female who presents for follow-up on constipation and GERD.  The patient was last seen in our office 12/13/2017 for the same as well as weight loss.  EGD up-to-date 12/14/2016 with mild gastritis and recommended continue PPI, no H. pylori.  History of acute diverticulitis in September 2017.  For constipation has previously tried and failed MiraLAX, Colace, Amitiza, and other over-the-counter options.  Linzess was effective for her.  However, there is tenuous insurance coverage.  We have attempted tier exception for Linzess, did not qualify for patient assistance and this would cost $400 a month.  Only medication at a lower tier is lactulose.  At her last visit she noted persistent constipation with a bowel movement every other day but incomplete emptying associated with abdominal pain and bloating and nausea.  No vomiting.  No hematochezia or melena.  Decreased appetite and weight loss of 8 pounds over the previous 2 to 3 weeks, unintentionally.  Objectively she has lost 15 pounds since her previous office visit 3 months prior.  No abdominal imaging since 2017.  Sees cardiology and noted intermittent chest tightness although not at her office visit.  Recommended strongly that she follow-up with cardiology.  Also recommended increased fluid intake, fiber daily, Colace daily, MiraLAX 1-2 times a day, follow-up in 2 months.  Today she states she's doing ok. Still with constipation. Was taking Colace once daily but had bloating from it. Was having bloating from MiraLAX as well. She stopped taking them. She is not wanting to try Lactulose. She saw on tv they had a "health food store whole body cleanse." She is asking for samples. I explained that we  cannot supply her medication through samples. She acknowledges. She is still having straining. She is unsure if she's tried Linzess. She's getting frustrated because her insurance is charging significant amounts for all her medications. Denies hematochezia, melena, unintentional weight loss. Her weight has increased 2 lbs in the past 3 months. She is having abdominal pain when she's very constipated. Feels like she stays full "all the time." When she was on Linzess she did not have any of these symptoms. GERD doing well currently on PPI. Denies chest pain, dyspnea, dizziness, lightheadedness, syncope, near syncope. Denies any other upper or lower GI symptoms.  Past Medical History:  Diagnosis Date  . Blood clot of artery under arm ([place])   . Cyst on liver  . Depression   . Diabetes mellitus    controlled by diet  . Diverticulitis   . DVT of axillary vein, acute right ([place]) 11/24/2012   Dx on Jan 28, 2012.    . Fibrocystic breast disease   . Hepatic cyst   . Hypertension   . IBS (irritable bowel syndrome)   . Impaired glucose tolerance   . Menopause     Past Surgical History:  Procedure Laterality Date  . ABDOMINAL HYSTERECTOMY    . BACK SURGERY    . BREAST SURGERY  right   cyst removed  . CAST APPLICATION  4/96/7591   Procedure: CAST APPLICATION;  Surgeon: Carole Civil, MD;  Location: [facility];  Service: Orthopedics;  Laterality: Right;  procedure room   . CHOLECYSTECTOMY    . COLONOSCOPY  12/04/2004   NUR:Few small diverticula at sigmoid colon/Small cecal polyp ablated   . COLONOSCOPY N/A 09/24/2014   SLF: small internal hemorrhoids  . ESOPHAGOGASTRODUODENOSCOPY N/A 02/02/2016   Procedure: ESOPHAGOGASTRODUODENOSCOPY (EGD);  Surgeon: Danie Binder, MD;  Location: [facility];  Service: Endoscopy;  Laterality: N/A;  930   . KNEE ARTHROCENTESIS  left  . TUBAL LIGATION      Current Outpatient Medications  Medication Sig Dispense Refill  . acarbose (PRECOSE) 25 MG tablet Take  1 tablet (25 mg total) by mouth 3 (three) times daily with meals. 270 tablet 1  . acetaminophen (TYLENOL) 500 MG tablet Take 1,000 mg by mouth every 6 (six) hours as needed for mild pain, moderate pain or headache.     . Alcohol Swabs (B-D SINGLE USE SWABS REGULAR) PADS USE EVERY DAY 100 each 5  . Ca Phosphate-Cholecalciferol (CALCIUM/VITAMIN D3 GUMMIES) 250-350 MG-UNIT CHEW     . Cholecalciferol (VITAMIN D3) 5000 units CAPS Take 5,000 Units by mouth daily.    . clopidogrel (PLAVIX) 75 MG tablet TAKE 1 TABLET BY MOUTH ONCE DAILY 90 tablet 3  . diphenhydrAMINE (BENADRYL) 25 mg capsule Take by mouth as needed.     . gabapentin (NEURONTIN) 100 MG capsule Take 2 capsules (200 mg total) by mouth at bedtime. 60 capsule 3  . hydrochlorothiazide (HYDRODIURIL) 12.5 MG tablet Take 1 tablet (12.5 mg total) by mouth daily. 90 tablet 1  . losartan (COZAAR) 50 MG tablet Take 1 tablet (50 mg total) by mouth daily. 90 tablet 1  . Multiple Vitamins-Minerals (MULTIVITAMIN WITH MINERALS) tablet Take 1 tablet by mouth daily.    . pantoprazole (PROTONIX) 40 MG tablet Take 1 tablet (40 mg total) by mouth daily. 90 tablet 3  . POT BICARB-POT CHLORIDE,25MEQ, (EFFERVESCENT POT CHLORIDE, 25,) 25 MEQ TBEF take 1 tablet by oral route 2 times every day dissolved in 3 to 4 ounces (1/2 cup) of water with meals    . pravastatin (PRAVACHOL) 20 MG tablet Take 1 tablet (20 mg total) by mouth at bedtime. 90 tablet 3  . TRUEPLUS LANCETS 33G MISC TEST EVERY DAY 100 each 5   No current facility-administered medications for this visit.     Allergies as of 03/13/2018 - Review Complete 03/13/2018  Allergen Reaction Noted  . Norvasc [amlodipine besylate] Other (See Comments) 11/21/2012  . Penicillins Hives and Other (See Comments)   . Advair diskus [fluticasone-salmeterol] Palpitations 11/21/2012  . Metformin and related Other (See Comments) 09/28/2013    Family History  Problem Relation Age of Onset  . Stroke Mother   . Heart  attack Mother   . Cancer Sister   . Diabetes Sister   . Seizures Brother   . Diabetes Brother   . Heart disease Brother   . Kidney disease Son   . Colon cancer Neg Hx     Social History   Socioeconomic History  . Marital status: Married    Spouse name: Not on file  . Number of children: Not on file  . Years of education: Not on file  . Highest education level: Not on file  Occupational History  . Occupation: Retired  Scientific laboratory technician  . Financial resource strain: Not on file  . Food insecurity:    Worry: Not on file    Inability: Not on file  . Transportation needs:    Medical: Not on file    Non-medical: Not on file  Tobacco Use  . Smoking status: Never Smoker  .  Smokeless tobacco: Never Used  . Tobacco comment: Never smoked  Substance and Sexual Activity  . Alcohol use: No    Alcohol/week: 0.0 oz  . Drug use: No  . Sexual activity: Yes  Lifestyle  . Physical activity:    Days per week: Not on file    Minutes per session: Not on file  . Stress: Not on file  Relationships  . Social connections:    Talks on phone: Not on file    Gets together: Not on file    Attends religious service: Not on file    Active member of club or organization: Not on file    Attends meetings of clubs or organizations: Not on file    Relationship status: Not on file  Other Topics Concern  . Not on file  Social History Narrative  . Not on file    Review of Systems: Complete ROS negative except as per HPI.  Physical Exam: BP (!) 151/72   Pulse (!) 49   Temp (!) 97.2 F (36.2 C) (Oral)   Ht 5\' 5"  (1.651 m)   Wt 241 lb 6.4 oz (109.5 kg)   BMI 40.17 kg/m  General:   Alert and oriented. Pleasant and cooperative. Well-nourished and well-developed.  Eyes:  Without icterus, sclera clear and conjunctiva pink.  Ears:  Normal auditory acuity. Cardiovascular:  S1, S2 present without murmurs appreciated. Extremities without clubbing or edema. Respiratory:  Clear to auscultation  bilaterally. No wheezes, rales, or rhonchi. No distress.  Gastrointestinal:  +BS, soft, and non-distended. Mild lower abdominal TTP. No HSM noted. No guarding or rebound. No masses appreciated.  Rectal:  Deferred  Musculoskalatal:  Symmetrical without gross deformities. Neurologic:  Alert and oriented x4;  grossly normal neurologically. Psych:  Alert and cooperative. Normal mood and affect. Heme/Lymph/Immune: No excessive bruising noted.    03/13/2018 9:08 AM   Disclaimer: This note was dictated with voice recognition software. Similar sounding words can inadvertently be transcribed and may not be corrected upon review.

## 2018-03-13 NOTE — Patient Instructions (Signed)
1. I am able to provide you 2 boxes of Linzess today. 2. As we discussed, you can try over-the-counter laxatives to see if you can find one that works well enough for you when taken every 2 to 3 days. 3. I will have you follow-up in 6 months which will be after the start of the new year.  Hopefully, either by different insurance or by change in [employer]'s formulary, we can have better luck treating your constipation. 4. Call us if you have any questions or concerns.  At [facility] we value your feedback. You may receive a survey about your visit today. Please share your experience as we strive to create trusting relationships with our patients to provide genuine, compassionate, quality care.  It was great to see you today!  I hope you have a wonderful summer!!

## 2018-03-13 NOTE — Assessment & Plan Note (Signed)
Persistent constipation likely due to IBS.  The only medication that has worked his [place] and this is too expensive as per above.  She also has associated IBS symptoms including bloating, abdominal pain, fullness.  Further treatment as per above.  Follow-up in 6 months.

## 2018-03-14 ENCOUNTER — Telehealth: Payer: Self-pay

## 2018-03-14 NOTE — Telephone Encounter (Signed)
Noted, no further recommendations. 

## 2018-03-14 NOTE — Telephone Encounter (Signed)
Per [place] pt is given samples of Linzess 72 mcg to take 2 tablets daily 30 min prior to breakfast. She will pick up the samples this week.

## 2018-04-05 ENCOUNTER — Ambulatory Visit: Payer: Medicare HMO

## 2018-04-10 ENCOUNTER — Ambulatory Visit: Payer: Medicare HMO | Admitting: Orthotics

## 2018-04-10 DIAGNOSIS — E1142 Type 2 diabetes mellitus with diabetic polyneuropathy: Secondary | ICD-10-CM

## 2018-04-10 DIAGNOSIS — M2041 Other hammer toe(s) (acquired), right foot: Secondary | ICD-10-CM | POA: Diagnosis not present

## 2018-04-10 DIAGNOSIS — M2042 Other hammer toe(s) (acquired), left foot: Secondary | ICD-10-CM | POA: Diagnosis not present

## 2018-04-10 DIAGNOSIS — M2012 Hallux valgus (acquired), left foot: Secondary | ICD-10-CM | POA: Diagnosis not present

## 2018-04-10 DIAGNOSIS — M2011 Hallux valgus (acquired), right foot: Secondary | ICD-10-CM | POA: Diagnosis not present

## 2018-04-10 NOTE — Progress Notes (Signed)
Patient had appointment today for definitive and final diabetic shoe fitting and delivery.  Patient was seen by Betha, C.Ped, [facility].   The inserts fit well and accomplished full contact with the plantar surface of the foot bilateral; the shoes fit well and offered forefoot freedom, no noticible heel slippage, and good toe clearance w/ the insert in place.  Patient was advised to monitor of any skin irritation, breakdown.  Patient was satisfied with fit and function. 

## 2018-04-19 ENCOUNTER — Other Ambulatory Visit: Payer: Self-pay | Admitting: Family Medicine

## 2018-04-28 ENCOUNTER — Other Ambulatory Visit: Payer: Self-pay | Admitting: Family Medicine

## 2018-05-12 ENCOUNTER — Ambulatory Visit: Payer: Medicare HMO | Admitting: Family Medicine

## 2018-05-19 ENCOUNTER — Ambulatory Visit: Payer: Medicare HMO | Admitting: Family Medicine

## 2018-06-06 ENCOUNTER — Ambulatory Visit (INDEPENDENT_AMBULATORY_CARE_PROVIDER_SITE_OTHER): Payer: Medicare HMO | Admitting: Family Medicine

## 2018-06-06 ENCOUNTER — Encounter: Payer: Self-pay | Admitting: Family Medicine

## 2018-06-06 VITALS — BP 128/80 | Ht 65.0 in | Wt 237.0 lb

## 2018-06-06 DIAGNOSIS — Z23 Encounter for immunization: Secondary | ICD-10-CM | POA: Diagnosis not present

## 2018-06-06 DIAGNOSIS — E1169 Type 2 diabetes mellitus with other specified complication: Secondary | ICD-10-CM | POA: Insufficient documentation

## 2018-06-06 DIAGNOSIS — E785 Hyperlipidemia, unspecified: Secondary | ICD-10-CM | POA: Diagnosis not present

## 2018-06-06 DIAGNOSIS — I10 Essential (primary) hypertension: Secondary | ICD-10-CM | POA: Diagnosis not present

## 2018-06-06 DIAGNOSIS — E119 Type 2 diabetes mellitus without complications: Secondary | ICD-10-CM

## 2018-06-06 DIAGNOSIS — E1140 Type 2 diabetes mellitus with diabetic neuropathy, unspecified: Secondary | ICD-10-CM

## 2018-06-06 LAB — POCT GLYCOSYLATED HEMOGLOBIN (HGB A1C): HEMOGLOBIN A1C: 6.6 % — AB (ref 4.0–5.6)

## 2018-06-06 MED ORDER — HYDROCHLOROTHIAZIDE 25 MG PO TABS: 25.0000 mg | ORAL_TABLET | Freq: Every day | ORAL | 1 refills | Status: DC

## 2018-06-06 MED ORDER — LOSARTAN POTASSIUM 50 MG PO TABS: 50.0000 mg | ORAL_TABLET | Freq: Every day | ORAL | 1 refills | Status: DC

## 2018-06-06 MED ORDER — GABAPENTIN 100 MG PO CAPS: 200.0000 mg | ORAL_CAPSULE | Freq: Every day | ORAL | 3 refills | Status: DC

## 2018-06-06 MED ORDER — PANTOPRAZOLE SODIUM 40 MG PO TBEC: 40.0000 mg | DELAYED_RELEASE_TABLET | Freq: Every day | ORAL | 1 refills | Status: DC

## 2018-06-06 MED ORDER — ACARBOSE 25 MG PO TABS: 25.0000 mg | ORAL_TABLET | Freq: Three times a day (TID) | ORAL | 1 refills | Status: DC

## 2018-06-06 NOTE — Progress Notes (Signed)
Subjective:    Patient ID: [name], female    DOB: 1946-03-02, 72 y.o.   MRN: [id number]  Diabetes  She presents for her follow-up diabetic visit. She has type 2 diabetes mellitus. Pertinent negatives for hypoglycemia include no confusion or dizziness. Pertinent negatives for diabetes include no chest pain, no fatigue, no polydipsia, no polyphagia and no weakness. She is compliant with treatment all of the time. Home blood sugar record trend: has not been checking blood sugar due to staying with husband at hospital. She sees a podiatrist.Eye exam is current (july 2019).   Pt would like refill on gabapentin that is prescribed by foot specialist.  Patient denies any cardiac symptoms she is taking her Plavix denies any bleeding troubles She is taking gabapentin for the neuropathy in her feet it does help with the burning and tingling Patient for blood pressure check up.  The patient does have hypertension.  The patient is on medication.  Patient relates compliance with meds. Todays BP reviewed with the patient. Patient denies issues with medication. Patient relates reasonable diet. Patient tries to minimize salt. Patient aware of BP goals.  Patient does have ongoing trouble with reflux.  Takes medication on a regular basis.  Tries to minimize foods as best they can.  They understand the importance of dietary compliance.  May also try to avoid eating a large meal close to bedtime.  Patient denies any dysphagia denies hematochezia.  States medicine does a good job keeping the problem under good control.  Without the medication may certainly have issues.They desire to continue taking their medication.  Patient here for follow-up regarding cholesterol.  The patient does have hyperlipidemia.  Patient does try to maintain a reasonable diet.  Patient does take the medication on a regular basis.  Denies missing a dose.  The patient denies any obvious side effects.  Prior blood work results  reviewed with the patient.  The patient is aware of his cholesterol goals and the need to keep it under good control to lessen the risk of disease.   Ankles swelling.  She denies PND orthopnea.  Results for orders placed or performed in visit on 06/06/18  POCT glycosylated hemoglobin (Hb A1C)  Result Value Ref Range   Hemoglobin A1C 6.6 (A) 4.0 - 5.6 %   HbA1c POC (<> result, manual entry)     HbA1c, POC (prediabetic range)     HbA1c, POC (controlled diabetic range)      Flu vaccine today.    Review of Systems  Constitutional: Negative for activity change, appetite change and fatigue.  HENT: Negative for congestion and rhinorrhea.   Respiratory: Negative for cough and shortness of breath.   Cardiovascular: Negative for chest pain and leg swelling.  Gastrointestinal: Negative for abdominal pain and diarrhea.  Endocrine: Negative for polydipsia and polyphagia.  Skin: Negative for color change.  Neurological: Negative for dizziness and weakness.  Psychiatric/Behavioral: Negative for behavioral problems and confusion.   Recent labs from earlier this year were reviewed no need to repeat labs at this point    Objective:   Physical Exam  Constitutional: She appears well-nourished. No distress.  HENT:  Head: Normocephalic and atraumatic.  Eyes: Right eye exhibits no discharge. Left eye exhibits no discharge.  Neck: No tracheal deviation present.  Cardiovascular: Normal rate, regular rhythm and normal heart sounds.  No murmur heard. Pulmonary/Chest: Effort normal and breath sounds normal. No respiratory distress.  Musculoskeletal: She exhibits no edema.  Lymphadenopathy:  She has no cervical adenopathy.  Neurological: She is alert. Coordination normal.  Skin: Skin is warm and dry.  Psychiatric: She has a normal mood and affect. Her behavior is normal.  Vitals reviewed.   25 minutes was spent with the patient.  This statement verifies that 25 minutes was indeed spent with the  patient.  More than 50% of this visit-total duration of the visit-was spent in counseling and coordination of care. The issues that the patient came in for today as reflected in the diagnosis (s) please refer to documentation for further details.       Assessment & Plan:  Significant stress Husband has  lung cancer-currently in ICU  HTN- Patient was seen today as part of a visit regarding hypertension. The importance of healthy diet and regular physical activity was discussed. The importance of compliance with medications discussed.  Ideal goal is to keep blood pressure low elevated levels certainly below 686/16 when possible.  The patient was counseled that keeping blood pressure under control lessen his risk of complications.  The importance of regular follow-ups was discussed with the patient.  Low-salt diet such as DASH recommended.  Regular physical activity was recommended as well.  Patient was advised to keep regular follow-ups.  The patient was seen today as part of a comprehensive visit for diabetes. The importance of keeping her A1c at or below 7 was discussed.  Importance of regular physical activity was discussed.   The importance of adherence to medication as well as a controlled low starch/sugar diet was also discussed.  Standard follow-up visit recommended.  Also patient aware failure to keep diabetes under control increases the risk of complications.  The patient was seen today as part of an evaluation regarding hyperlipidemia.  Recent lab work has been reviewed with the patient as well as the goals for good cholesterol care.  In addition to this medications have been discussed the importance of compliance with diet and medications discussed as well.  Finally the patient is aware that poor control of cholesterol, noncompliance can dramatically increase the risk of complications. The patient will keep regular office visits and the patient does agreed to periodic lab  work.  Pedal edema could be potentially related to gabapentin could be fluid retention increase HCTZ to 25 mg every morning  Lab work to be completed by early March with follow-up office visit

## 2018-06-06 NOTE — Progress Notes (Signed)
REVIEWED-NO ADDITIONAL RECOMMENDATIONS. 

## 2018-06-07 ENCOUNTER — Ambulatory Visit: Payer: Medicare HMO | Admitting: Nurse Practitioner

## 2018-06-20 ENCOUNTER — Telehealth: Payer: Self-pay | Admitting: Family Medicine

## 2018-06-20 DIAGNOSIS — S99912A Unspecified injury of left ankle, initial encounter: Secondary | ICD-10-CM | POA: Diagnosis not present

## 2018-06-20 NOTE — Telephone Encounter (Signed)
FYI- Just wanted you to know that patient's husband passed away on Oct.17.

## 2018-06-22 ENCOUNTER — Ambulatory Visit: Payer: Medicare HMO

## 2018-06-22 ENCOUNTER — Ambulatory Visit: Payer: Medicare HMO | Admitting: Podiatry

## 2018-06-22 ENCOUNTER — Encounter: Payer: Self-pay | Admitting: Podiatry

## 2018-06-22 DIAGNOSIS — S92015A Nondisplaced fracture of body of left calcaneus, initial encounter for closed fracture: Secondary | ICD-10-CM

## 2018-06-22 NOTE — Progress Notes (Signed)
dg 

## 2018-06-26 ENCOUNTER — Ambulatory Visit (HOSPITAL_COMMUNITY)
Admission: RE | Admit: 2018-06-26 | Discharge: 2018-06-26 | Disposition: A | Discharge status: Home / Self Care | Payer: Medicare HMO | Source: Ambulatory Visit | Attending: Family Medicine | Admitting: Family Medicine

## 2018-06-26 ENCOUNTER — Other Ambulatory Visit: Payer: Self-pay | Admitting: *Deleted

## 2018-06-26 ENCOUNTER — Ambulatory Visit (INDEPENDENT_AMBULATORY_CARE_PROVIDER_SITE_OTHER): Payer: Medicare HMO | Admitting: Family Medicine

## 2018-06-26 ENCOUNTER — Encounter: Payer: Self-pay | Admitting: Family Medicine

## 2018-06-26 ENCOUNTER — Telehealth: Payer: Self-pay | Admitting: *Deleted

## 2018-06-26 VITALS — BP 134/78 | Ht 65.0 in

## 2018-06-26 DIAGNOSIS — M79605 Pain in left leg: Secondary | ICD-10-CM | POA: Diagnosis not present

## 2018-06-26 DIAGNOSIS — M7989 Other specified soft tissue disorders: Secondary | ICD-10-CM | POA: Diagnosis not present

## 2018-06-26 DIAGNOSIS — R600 Localized edema: Secondary | ICD-10-CM | POA: Diagnosis not present

## 2018-06-26 NOTE — Progress Notes (Signed)
   Subjective:    Patient ID: [name], female    DOB: [birth year], 72 y.o.   MRN: [id number]  HPI PT here today for leg swelling. Pt states she fell last week. Pt has been to Urgent Care and [facility]. Pt states she is taking Tylenol. Pt had X-Rays done at Urgent Care in [place].  This patient relates that she has had lower leg pain discomfort swelling in the calf and the lower thigh she had a small fracture that she is supposed to wear a postop boot for the next 6 to 8 weeks she is going to be seeing the specialist in early December for a follow-up she relates pain and swelling in the lower leg concerning for the possibility of a DVT  She denies any shortness of breath she relates little bit of pain in the lower leg with some swelling denies any other major issues going on did not have the swelling until the injury and having to wear a postoperative boot [place] ordered and the pt is aware and she will go to [facility] to have done. Per Yvone Neu with [facility] no authorization needed Reference # [id number] Review of Systems  Constitutional: Negative for activity change, appetite change and fatigue.  HENT: Negative for congestion.   Respiratory: Negative for cough.   Cardiovascular: Negative for chest pain.  Gastrointestinal: Negative for abdominal pain.  Skin: Negative for color change.  Neurological: Negative for headaches.  Psychiatric/Behavioral: Negative for behavioral problems.       Objective:   Physical Exam  Constitutional: She appears well-nourished. No distress.  HENT:  Head: Normocephalic and atraumatic.  Eyes: Right eye exhibits no discharge. Left eye exhibits no discharge.  Neck: No tracheal deviation present.  Cardiovascular: Normal rate, regular rhythm and normal heart sounds.  No murmur heard. Pulmonary/Chest: Effort normal and breath sounds normal. No respiratory distress.  Musculoskeletal: She exhibits no edema.  Lymphadenopathy:    She has no cervical  adenopathy.  Neurological: She is alert. Coordination normal.  Skin: Skin is warm and dry.  Psychiatric: She has a normal mood and affect. Her behavior is normal.  Vitals reviewed.  Patient under some stress since her husband died last week  25 minutes was spent with the patient.  This statement verifies that 25 minutes was indeed spent with the patient.  More than 50% of this visit-total duration of the visit-was spent in counseling and coordination of care. The issues that the patient came in for today as reflected in the diagnosis (s) please refer to documentation for further details.      Assessment & Plan:  Left leg swelling Recommend stat DVT rule out with ultrasound Dressing was removed the Ace bandage reapplied Continue boot Await results of the test In addition to this follow-up with foot center regarding the fracture   Should be noted that the ultrasound was negative for DVT.  We will inform the patient.

## 2018-06-26 NOTE — Telephone Encounter (Signed)
I was able to talk with the patient about this and express our condolences

## 2018-06-27 DIAGNOSIS — M7989 Other specified soft tissue disorders: Secondary | ICD-10-CM | POA: Diagnosis not present

## 2018-06-28 ENCOUNTER — Other Ambulatory Visit: Payer: Self-pay | Admitting: *Deleted

## 2018-06-28 DIAGNOSIS — M7989 Other specified soft tissue disorders: Secondary | ICD-10-CM

## 2018-06-28 DIAGNOSIS — R7989 Other specified abnormal findings of blood chemistry: Secondary | ICD-10-CM

## 2018-06-28 LAB — BASIC METABOLIC PANEL
BUN/Creatinine Ratio: 21 (ref 12–28)
BUN: 19 mg/dL (ref 8–27)
CALCIUM: 10.2 mg/dL (ref 8.7–10.3)
CO2: 25 mmol/L (ref 20–29)
Chloride: 101 mmol/L (ref 96–106)
Creatinine, Ser: 0.89 mg/dL (ref 0.57–1.00)
GFR, EST AFRICAN AMERICAN: 76 mL/min/{1.73_m2} (ref >59)
GFR, EST NON AFRICAN AMERICAN: 66 mL/min/{1.73_m2} (ref >59)
Glucose: 81 mg/dL (ref 65–99)
Potassium: 4.6 mmol/L (ref 3.5–5.2)
Sodium: 142 mmol/L (ref 134–144)

## 2018-06-28 LAB — D-DIMER, QUANTITATIVE: D-DIMER: 0.53 mg/L FEU — ABNORMAL HIGH (ref 0.00–0.49)

## 2018-06-29 NOTE — Progress Notes (Signed)
Subjective:  Patient ID: [name], female    DOB: [birth year],  MRN: [id number]  Chief Complaint  Patient presents with  . Fracture    left foot injury 2 days ago, Urgent Care, diagnosed w/ fracture    72 y.o. female presents with the above complaint.  Reports left ankle injury going on for door.  [place] backwards 2 days ago.  Was seen in urgent care and diagnosed with a fracture.  Brought her x-rays with her today.   Review of Systems: Negative except as noted in the HPI. Denies N/V/F/Ch.  Past Medical History:  Diagnosis Date  . Blood clot of artery under arm ([place])   . Cyst on liver  . Depression   . Diabetes mellitus    controlled by diet  . Diverticulitis   . DVT of axillary vein, acute right ([place]) 11/24/2012   Dx on Jan 28, 2012.    . Fibrocystic breast disease   . Hepatic cyst   . Hypertension   . IBS (irritable bowel syndrome)   . Impaired glucose tolerance   . Menopause     Current Outpatient Medications:  .  acarbose (PRECOSE) 25 MG tablet, Take 25 mg by mouth daily. , Disp: , Rfl:  .  acetaminophen (TYLENOL) 500 MG tablet, Take 1,000 mg by mouth every 6 (six) hours as needed for mild pain, moderate pain or headache. , Disp: , Rfl:  .  Alcohol Swabs (B-D SINGLE USE SWABS REGULAR) PADS, USE EVERY DAY, Disp: 100 each, Rfl: 5 .  Ca Phosphate-Cholecalciferol (CALCIUM/VITAMIN D3 GUMMIES) 250-350 MG-UNIT CHEW, daily. , Disp: , Rfl:  .  Cholecalciferol (D-5000) 5000 units TABS, Take 1 tablet by mouth daily. , Disp: , Rfl:  .  clopidogrel (PLAVIX) 75 MG tablet, Take 1 tablet by mouth once daily, Disp: 90 tablet, Rfl: 0 .  dicyclomine (BENTYL) 10 MG capsule, Take 1 capsule (10 mg total) by mouth 3 (three) times daily as needed for spasms (with/before meals)., Disp: 30 capsule, Rfl: 3 .  hydrochlorothiazide (HYDRODIURIL) 25 MG tablet, TAKE 1 TABLET EVERY DAY, Disp: 90 tablet, Rfl: 1 .  losartan (COZAAR) 50 MG tablet, TAKE 1 TABLET EVERY DAY, Disp: 90 tablet, Rfl:  1 .  Multiple Vitamins-Minerals (MULTIVITAMIN WITH MINERALS) tablet, Take 1 tablet by mouth daily., Disp: , Rfl:  .  pantoprazole (PROTONIX) 40 MG tablet, TAKE 1 TABLET EVERY DAY, Disp: 90 tablet, Rfl: 1 .  pravastatin (PRAVACHOL) 20 MG tablet, TAKE 1 TABLET AT BEDTIME, Disp: 90 tablet, Rfl: 1 .  TRUEPLUS LANCETS 33G MISC, TEST EVERY DAY, Disp: 100 each, Rfl: 5  Social History   Tobacco Use  Smoking Status Never Smoker  Smokeless Tobacco Never Used  Tobacco Comment   Never smoked    Allergies  Allergen Reactions  . Norvasc [Amlodipine Besylate] Other (See Comments)    Reaction:  Fatigue   . Penicillins Hives and Other (See Comments)    Has patient had a PCN reaction causing immediate rash, facial/tongue/throat swelling, SOB or lightheadedness with hypotension: Yes Has patient had a PCN reaction causing severe rash involving mucus membranes or skin necrosis: No Has patient had a PCN reaction that required hospitalization No Has patient had a PCN reaction occurring within the last 10 years: No If all of the above answers are "NO", then may proceed with Cephalosporin use.   . Advair Diskus [Fluticasone-Salmeterol] Palpitations  . Metformin And Related Other (See Comments)    Reaction:  GI upset    Objective:  There were no vitals filed for this visit. There is no height or weight on file to calculate BMI. Constitutional Well developed. Well nourished.  Vascular Dorsalis pedis pulses palpable bilaterally. Posterior tibial pulses palpable bilaterally. Capillary refill normal to all digits.  No cyanosis or clubbing noted. Pedal hair growth normal.  Neurologic Normal speech. Oriented to person, place, and time. Epicritic sensation to light touch grossly present bilaterally.  Dermatologic Nails well groomed and normal in appearance. No open wounds. No skin lesions.  Orthopedic: Normal joint ROM without pain or crepitus bilaterally. No visible deformities. Pain on palpation     Radiographs: Taken and reviewed. Small avulsion fracture noted.   Assessment:   1. Closed nondisplaced fracture of body of left calcaneus, initial encounter    Plan:  Patient was evaluated and treated and all questions answered.  Calcaneus Fx Avulsion -Immobilize in boot -Benefit from non-op tx. -F/u in 6 weeks for new XR  Return in about 6 weeks (around 08/03/2018) for Ankle fracture f/u .

## 2018-06-30 ENCOUNTER — Telehealth: Payer: Self-pay | Admitting: *Deleted

## 2018-06-30 ENCOUNTER — Ambulatory Visit (HOSPITAL_COMMUNITY)
Admission: RE | Admit: 2018-06-30 | Discharge: 2018-06-30 | Disposition: A | Discharge status: Home / Self Care | Payer: Medicare HMO | Source: Ambulatory Visit | Attending: Family Medicine | Admitting: Family Medicine

## 2018-06-30 DIAGNOSIS — R7989 Other specified abnormal findings of blood chemistry: Secondary | ICD-10-CM | POA: Insufficient documentation

## 2018-06-30 DIAGNOSIS — M7989 Other specified soft tissue disorders: Secondary | ICD-10-CM | POA: Insufficient documentation

## 2018-06-30 NOTE — Telephone Encounter (Signed)
Pt had repeat leg ultrasound today at [facility]. Results are in epic for review.

## 2018-06-30 NOTE — Telephone Encounter (Signed)
Patient notified and verbalized understanding. 

## 2018-06-30 NOTE — Telephone Encounter (Signed)
Negative for DVT please let the patient be aware follow-up as needed and for scheduled office visit

## 2018-08-03 ENCOUNTER — Ambulatory Visit (INDEPENDENT_AMBULATORY_CARE_PROVIDER_SITE_OTHER): Payer: Medicare HMO

## 2018-08-03 ENCOUNTER — Ambulatory Visit (INDEPENDENT_AMBULATORY_CARE_PROVIDER_SITE_OTHER): Payer: Medicare HMO | Admitting: Podiatry

## 2018-08-03 ENCOUNTER — Encounter: Payer: Self-pay | Admitting: Podiatry

## 2018-08-03 DIAGNOSIS — S92015A Nondisplaced fracture of body of left calcaneus, initial encounter for closed fracture: Secondary | ICD-10-CM

## 2018-08-07 DIAGNOSIS — M25675 Stiffness of left foot, not elsewhere classified: Secondary | ICD-10-CM | POA: Diagnosis not present

## 2018-08-07 DIAGNOSIS — M25672 Stiffness of left ankle, not elsewhere classified: Secondary | ICD-10-CM | POA: Diagnosis not present

## 2018-08-07 DIAGNOSIS — M25372 Other instability, left ankle: Secondary | ICD-10-CM | POA: Diagnosis not present

## 2018-08-07 DIAGNOSIS — M25572 Pain in left ankle and joints of left foot: Secondary | ICD-10-CM | POA: Diagnosis not present

## 2018-08-08 DIAGNOSIS — M25572 Pain in left ankle and joints of left foot: Secondary | ICD-10-CM | POA: Diagnosis not present

## 2018-08-08 DIAGNOSIS — M25675 Stiffness of left foot, not elsewhere classified: Secondary | ICD-10-CM | POA: Diagnosis not present

## 2018-08-08 DIAGNOSIS — M25672 Stiffness of left ankle, not elsewhere classified: Secondary | ICD-10-CM | POA: Diagnosis not present

## 2018-08-08 DIAGNOSIS — M25372 Other instability, left ankle: Secondary | ICD-10-CM | POA: Diagnosis not present

## 2018-08-10 DIAGNOSIS — M25672 Stiffness of left ankle, not elsewhere classified: Secondary | ICD-10-CM | POA: Diagnosis not present

## 2018-08-10 DIAGNOSIS — M25572 Pain in left ankle and joints of left foot: Secondary | ICD-10-CM | POA: Diagnosis not present

## 2018-08-10 DIAGNOSIS — M25675 Stiffness of left foot, not elsewhere classified: Secondary | ICD-10-CM | POA: Diagnosis not present

## 2018-08-10 DIAGNOSIS — M25372 Other instability, left ankle: Secondary | ICD-10-CM | POA: Diagnosis not present

## 2018-08-14 DIAGNOSIS — M25572 Pain in left ankle and joints of left foot: Secondary | ICD-10-CM | POA: Diagnosis not present

## 2018-08-14 DIAGNOSIS — M25672 Stiffness of left ankle, not elsewhere classified: Secondary | ICD-10-CM | POA: Diagnosis not present

## 2018-08-14 DIAGNOSIS — M25372 Other instability, left ankle: Secondary | ICD-10-CM | POA: Diagnosis not present

## 2018-08-14 DIAGNOSIS — M25675 Stiffness of left foot, not elsewhere classified: Secondary | ICD-10-CM | POA: Diagnosis not present

## 2018-08-16 DIAGNOSIS — M25572 Pain in left ankle and joints of left foot: Secondary | ICD-10-CM | POA: Diagnosis not present

## 2018-08-16 DIAGNOSIS — M25675 Stiffness of left foot, not elsewhere classified: Secondary | ICD-10-CM | POA: Diagnosis not present

## 2018-08-16 DIAGNOSIS — M25672 Stiffness of left ankle, not elsewhere classified: Secondary | ICD-10-CM | POA: Diagnosis not present

## 2018-08-16 DIAGNOSIS — M25372 Other instability, left ankle: Secondary | ICD-10-CM | POA: Diagnosis not present

## 2018-08-17 ENCOUNTER — Telehealth: Payer: Self-pay | Admitting: *Deleted

## 2018-08-17 DIAGNOSIS — M25572 Pain in left ankle and joints of left foot: Secondary | ICD-10-CM | POA: Diagnosis not present

## 2018-08-17 DIAGNOSIS — M25672 Stiffness of left ankle, not elsewhere classified: Secondary | ICD-10-CM | POA: Diagnosis not present

## 2018-08-17 DIAGNOSIS — M25675 Stiffness of left foot, not elsewhere classified: Secondary | ICD-10-CM | POA: Diagnosis not present

## 2018-08-17 DIAGNOSIS — M25372 Other instability, left ankle: Secondary | ICD-10-CM | POA: Diagnosis not present

## 2018-08-17 NOTE — Telephone Encounter (Signed)
[facility] asked if pt can weight bear out of the brace. Dr. March Rummage states yes. I informed Kathlee Nations.

## 2018-08-22 DIAGNOSIS — M25672 Stiffness of left ankle, not elsewhere classified: Secondary | ICD-10-CM | POA: Diagnosis not present

## 2018-08-22 DIAGNOSIS — M25572 Pain in left ankle and joints of left foot: Secondary | ICD-10-CM | POA: Diagnosis not present

## 2018-08-22 DIAGNOSIS — M25675 Stiffness of left foot, not elsewhere classified: Secondary | ICD-10-CM | POA: Diagnosis not present

## 2018-08-22 DIAGNOSIS — M25372 Other instability, left ankle: Secondary | ICD-10-CM | POA: Diagnosis not present

## 2018-08-24 DIAGNOSIS — M25672 Stiffness of left ankle, not elsewhere classified: Secondary | ICD-10-CM | POA: Diagnosis not present

## 2018-08-24 DIAGNOSIS — M25572 Pain in left ankle and joints of left foot: Secondary | ICD-10-CM | POA: Diagnosis not present

## 2018-08-24 DIAGNOSIS — M25372 Other instability, left ankle: Secondary | ICD-10-CM | POA: Diagnosis not present

## 2018-08-24 DIAGNOSIS — M25675 Stiffness of left foot, not elsewhere classified: Secondary | ICD-10-CM | POA: Diagnosis not present

## 2018-08-25 DIAGNOSIS — M25672 Stiffness of left ankle, not elsewhere classified: Secondary | ICD-10-CM | POA: Diagnosis not present

## 2018-08-25 DIAGNOSIS — M25372 Other instability, left ankle: Secondary | ICD-10-CM | POA: Diagnosis not present

## 2018-08-25 DIAGNOSIS — M25675 Stiffness of left foot, not elsewhere classified: Secondary | ICD-10-CM | POA: Diagnosis not present

## 2018-08-25 DIAGNOSIS — M25572 Pain in left ankle and joints of left foot: Secondary | ICD-10-CM | POA: Diagnosis not present

## 2018-08-28 DIAGNOSIS — M25372 Other instability, left ankle: Secondary | ICD-10-CM | POA: Diagnosis not present

## 2018-08-28 DIAGNOSIS — M25675 Stiffness of left foot, not elsewhere classified: Secondary | ICD-10-CM | POA: Diagnosis not present

## 2018-08-28 DIAGNOSIS — M25672 Stiffness of left ankle, not elsewhere classified: Secondary | ICD-10-CM | POA: Diagnosis not present

## 2018-08-28 DIAGNOSIS — M25572 Pain in left ankle and joints of left foot: Secondary | ICD-10-CM | POA: Diagnosis not present

## 2018-08-29 DIAGNOSIS — M25672 Stiffness of left ankle, not elsewhere classified: Secondary | ICD-10-CM | POA: Diagnosis not present

## 2018-08-29 DIAGNOSIS — M25572 Pain in left ankle and joints of left foot: Secondary | ICD-10-CM | POA: Diagnosis not present

## 2018-08-29 DIAGNOSIS — M25675 Stiffness of left foot, not elsewhere classified: Secondary | ICD-10-CM | POA: Diagnosis not present

## 2018-08-29 DIAGNOSIS — M25372 Other instability, left ankle: Secondary | ICD-10-CM | POA: Diagnosis not present

## 2018-08-31 DIAGNOSIS — M25672 Stiffness of left ankle, not elsewhere classified: Secondary | ICD-10-CM | POA: Diagnosis not present

## 2018-08-31 DIAGNOSIS — M25675 Stiffness of left foot, not elsewhere classified: Secondary | ICD-10-CM | POA: Diagnosis not present

## 2018-08-31 DIAGNOSIS — M25572 Pain in left ankle and joints of left foot: Secondary | ICD-10-CM | POA: Diagnosis not present

## 2018-08-31 DIAGNOSIS — M25372 Other instability, left ankle: Secondary | ICD-10-CM | POA: Diagnosis not present

## 2018-09-04 DIAGNOSIS — M25572 Pain in left ankle and joints of left foot: Secondary | ICD-10-CM | POA: Diagnosis not present

## 2018-09-04 DIAGNOSIS — M25675 Stiffness of left foot, not elsewhere classified: Secondary | ICD-10-CM | POA: Diagnosis not present

## 2018-09-04 DIAGNOSIS — M25372 Other instability, left ankle: Secondary | ICD-10-CM | POA: Diagnosis not present

## 2018-09-04 DIAGNOSIS — M25672 Stiffness of left ankle, not elsewhere classified: Secondary | ICD-10-CM | POA: Diagnosis not present

## 2018-09-06 ENCOUNTER — Ambulatory Visit: Payer: Medicare HMO | Admitting: Nurse Practitioner

## 2018-09-11 DIAGNOSIS — M25672 Stiffness of left ankle, not elsewhere classified: Secondary | ICD-10-CM | POA: Diagnosis not present

## 2018-09-11 DIAGNOSIS — M25372 Other instability, left ankle: Secondary | ICD-10-CM | POA: Diagnosis not present

## 2018-09-11 DIAGNOSIS — M25675 Stiffness of left foot, not elsewhere classified: Secondary | ICD-10-CM | POA: Diagnosis not present

## 2018-09-11 DIAGNOSIS — M25572 Pain in left ankle and joints of left foot: Secondary | ICD-10-CM | POA: Diagnosis not present

## 2018-09-13 ENCOUNTER — Telehealth: Payer: Self-pay | Admitting: Family Medicine

## 2018-09-13 NOTE — Telephone Encounter (Signed)
I tried calling pt to get more information no answer.Please advise.

## 2018-09-13 NOTE — Telephone Encounter (Signed)
I called and left a message asked that she r/c. 

## 2018-09-13 NOTE — Telephone Encounter (Signed)
Glucerna is okay I would try to avoid overusing it because it tends to have a fair amount of calories But if using it at a time when she is not eating much that would seem to be reasonable

## 2018-09-13 NOTE — Telephone Encounter (Signed)
Patient calling because she hasn't really had much of an appetite lately and was wondering if she could drink Glucerna since she is diabetic?  Church member is offering to buy it for her if it is okay to drink.

## 2018-09-14 ENCOUNTER — Ambulatory Visit: Payer: Medicare HMO | Admitting: Podiatry

## 2018-09-14 ENCOUNTER — Ambulatory Visit (INDEPENDENT_AMBULATORY_CARE_PROVIDER_SITE_OTHER): Payer: Medicare HMO

## 2018-09-14 DIAGNOSIS — S92015D Nondisplaced fracture of body of left calcaneus, subsequent encounter for fracture with routine healing: Secondary | ICD-10-CM

## 2018-09-14 DIAGNOSIS — S92015A Nondisplaced fracture of body of left calcaneus, initial encounter for closed fracture: Secondary | ICD-10-CM | POA: Diagnosis not present

## 2018-09-14 NOTE — Telephone Encounter (Signed)
Pt returned call and was informed that Glucerna was ok but to not over use it due to fair amount of calories but if drinking it due to not eating much, that is reasonable. Pt verbalized understanding.

## 2018-09-17 NOTE — Progress Notes (Signed)
Subjective:  Patient ID: [name], female    DOB: 06/01/1946,  MRN: [id number]  Chief Complaint  Patient presents with  . Fracture    6 week follow up ankle fracture left    73 y.o. female presents with the above complaint.  States that the left ankle is doing a lot better during therapy thinks that it is helping.   Review of Systems: Negative except as noted in the HPI. Denies N/V/F/Ch.  Past Medical History:  Diagnosis Date  . Blood clot of artery under arm ([place])   . Cyst on liver  . Depression   . Diabetes mellitus    controlled by diet  . Diverticulitis   . DVT of axillary vein, acute right ([place]) 11/24/2012   Dx on Jan 28, 2012.    . Fibrocystic breast disease   . Hepatic cyst   . Hypertension   . IBS (irritable bowel syndrome)   . Impaired glucose tolerance   . Menopause     Current Outpatient Medications:  .  acarbose (PRECOSE) 25 MG tablet, , Disp: , Rfl:  .  acetaminophen (TYLENOL) 500 MG tablet, Take 1,000 mg by mouth every 6 (six) hours as needed for mild pain, moderate pain or headache. , Disp: , Rfl:  .  Alcohol Swabs (B-D SINGLE USE SWABS REGULAR) PADS, USE EVERY DAY, Disp: 100 each, Rfl: 5 .  Ca Phosphate-Cholecalciferol (CALCIUM/VITAMIN D3 GUMMIES) 250-350 MG-UNIT CHEW, , Disp: , Rfl:  .  Cholecalciferol (D-5000) 5000 units TABS, , Disp: , Rfl:  .  clopidogrel (PLAVIX) 75 MG tablet, TAKE 1 TABLET BY MOUTH ONCE DAILY, Disp: 90 tablet, Rfl: 3 .  diphenhydrAMINE (BENADRYL) 25 mg capsule, Take by mouth as needed. , Disp: , Rfl:  .  gabapentin (NEURONTIN) 100 MG capsule, Take 2 capsules (200 mg total) by mouth at bedtime., Disp: 180 capsule, Rfl: 3 .  hydrochlorothiazide (HYDRODIURIL) 25 MG tablet, Take 1 tablet (25 mg total) by mouth daily., Disp: 90 tablet, Rfl: 1 .  losartan (COZAAR) 50 MG tablet, Take 1 tablet (50 mg total) by mouth daily., Disp: 90 tablet, Rfl: 1 .  Multiple Vitamins-Minerals (MULTIVITAMIN WITH MINERALS) tablet, Take 1 tablet by  mouth daily., Disp: , Rfl:  .  oxyCODONE-acetaminophen (PERCOCET/ROXICET) 5-325 MG tablet, , Disp: , Rfl: 0 .  pantoprazole (PROTONIX) 40 MG tablet, Take 1 tablet (40 mg total) by mouth daily., Disp: 90 tablet, Rfl: 1 .  POT BICARB-POT CHLORIDE,25MEQ, (EFFERVESCENT POT CHLORIDE, 25,) 25 MEQ TBEF, take 1 tablet by oral route 2 times every day dissolved in 3 to 4 ounces (1/2 cup) of water with meals, Disp: , Rfl:  .  pravastatin (PRAVACHOL) 20 MG tablet, TAKE 1 TABLET AT BEDTIME, Disp: 90 tablet, Rfl: 1 .  TRUEPLUS LANCETS 33G MISC, TEST EVERY DAY, Disp: 100 each, Rfl: 5  Social History   Tobacco Use  Smoking Status Never Smoker  Smokeless Tobacco Never Used  Tobacco Comment   Never smoked    Allergies  Allergen Reactions  . Norvasc [Amlodipine Besylate] Other (See Comments)    Reaction:  Fatigue   . Penicillins Hives and Other (See Comments)    Has patient had a PCN reaction causing immediate rash, facial/tongue/throat swelling, SOB or lightheadedness with hypotension: Yes Has patient had a PCN reaction causing severe rash involving mucus membranes or skin necrosis: No Has patient had a PCN reaction that required hospitalization No Has patient had a PCN reaction occurring within the last 10 years: No If all  of the above answers are "NO", then may proceed with Cephalosporin use.   . Advair Diskus [Fluticasone-Salmeterol] Palpitations  . Metformin And Related Other (See Comments)    Reaction:  GI upset    Objective:  There were no vitals filed for this visit. There is no height or weight on file to calculate BMI. Constitutional Well developed. Well nourished.  Vascular Dorsalis pedis pulses palpable bilaterally. Posterior tibial pulses palpable bilaterally. Capillary refill normal to all digits.  No cyanosis or clubbing noted. Pedal hair growth normal.  Neurologic Normal speech. Oriented to person, place, and time. Epicritic sensation to light touch grossly present  bilaterally.  Dermatologic Nails well groomed and normal in appearance. No open wounds. No skin lesions.  Orthopedic: Normal joint ROM without pain or crepitus bilaterally. No visible deformities. Pain on palpation lateral aspect of the left ankle   Radiographs: Taken and reviewed.  No longer evident avulsion fracture. Assessment:   1. Closed nondisplaced fracture of body of left calcaneus, initial encounter    Plan:  Patient was evaluated and treated and all questions answered.  Left calcaneus fracture -X-rays taken reviewed -Pain much improved -Slight continued swelling noted -Recommend compression -Continue PT to maximal medical improvement  Return if symptoms worsen or fail to improve.

## 2018-10-05 ENCOUNTER — Encounter: Payer: Self-pay | Admitting: Family Medicine

## 2018-10-05 ENCOUNTER — Ambulatory Visit (INDEPENDENT_AMBULATORY_CARE_PROVIDER_SITE_OTHER): Payer: Medicare HMO | Admitting: Family Medicine

## 2018-10-05 VITALS — BP 128/84 | Ht 65.0 in

## 2018-10-05 DIAGNOSIS — E1140 Type 2 diabetes mellitus with diabetic neuropathy, unspecified: Secondary | ICD-10-CM | POA: Diagnosis not present

## 2018-10-05 DIAGNOSIS — Z79899 Other long term (current) drug therapy: Secondary | ICD-10-CM

## 2018-10-05 DIAGNOSIS — E1169 Type 2 diabetes mellitus with other specified complication: Secondary | ICD-10-CM

## 2018-10-05 DIAGNOSIS — I10 Essential (primary) hypertension: Secondary | ICD-10-CM

## 2018-10-05 DIAGNOSIS — E785 Hyperlipidemia, unspecified: Secondary | ICD-10-CM | POA: Diagnosis not present

## 2018-10-05 DIAGNOSIS — F321 Major depressive disorder, single episode, moderate: Secondary | ICD-10-CM

## 2018-10-05 MED ORDER — DICYCLOMINE HCL 10 MG PO CAPS: ORAL_CAPSULE | ORAL | 2 refills | Status: DC

## 2018-10-05 MED ORDER — SERTRALINE HCL 50 MG PO TABS: 50.0000 mg | ORAL_TABLET | Freq: Every day | ORAL | 2 refills | Status: DC

## 2018-10-05 NOTE — Progress Notes (Signed)
   Subjective:    Patient ID: [name], female    DOB: [birth year], 73 y.o.   MRN: [id number]  HPI  Patient is here today with complaints of depression.He husband passed away 07-12-2018.  Patient having significant depression related issues feeling sad down blue intermittent headaches also in addition this not sleeping well intermittent abdominal pains also having frequent abdominal cramping some loose stools.  Denies any wheezing difficulty breathing high fever chills or bloody stools  She states she does not sleep, her head hurt. She just misses her husband so.  She has no desire to do anything. She does not want to eat, eating makes her feel sick t her stomach.  She thinks she may benefit from a counselor.    Review of Systems  Constitutional: Negative for activity change, appetite change and fatigue.  HENT: Negative for congestion and rhinorrhea.   Respiratory: Negative for cough and shortness of breath.   Cardiovascular: Negative for chest pain and leg swelling.  Gastrointestinal: Negative for abdominal pain and diarrhea.  Endocrine: Negative for polydipsia and polyphagia.  Skin: Negative for color change.  Neurological: Negative for dizziness and weakness.  Psychiatric/Behavioral: Negative for behavioral problems and confusion.       Objective:   Physical Exam Vitals signs reviewed.  Constitutional:      General: She is not in acute distress. HENT:     Head: Normocephalic and atraumatic.  Eyes:     General:        Right eye: No discharge.        Left eye: No discharge.  Neck:     Trachea: No tracheal deviation.  Cardiovascular:     Rate and Rhythm: Normal rate and regular rhythm.     Heart sounds: Normal heart sounds. No murmur.  Pulmonary:     Effort: Pulmonary effort is normal. No respiratory distress.     Breath sounds: Normal breath sounds.  Lymphadenopathy:     Cervical: No cervical adenopathy.  Skin:    General: Skin is warm and dry.    Neurological:     Mental Status: She is alert.     Coordination: Coordination normal.  Psychiatric:        Behavior: Behavior normal.           Assessment & Plan:  Major depression moderate start medication will try to find grief counseling for the patient as well as possible counselor with psychiatry  Patient to follow-up in March she will do lab work today  Intermittent abdominal pains intermittent headaches intermittent diarrhea I believe all of these are associated with her depression she will use dicyclomine 3 times daily as needed for the abdominal cramps and diarrhea she will use Tylenol as needed for headaches  Insomnia associated with depression we discussed behavioral techniques to improve sleep  25 minutes was spent with the patient.  This statement verifies that 25 minutes was indeed spent with the patient.  More than 50% of this visit-total duration of the visit-was spent in counseling and coordination of care. The issues that the patient came in for today as reflected in the diagnosis (s) please refer to documentation for further details.  We will call her with [facility] [phone] the patient was told that if she prefers to have a counselor with behavioral health she will let us know

## 2018-10-06 ENCOUNTER — Encounter: Payer: Self-pay | Admitting: Family Medicine

## 2018-10-06 LAB — BASIC METABOLIC PANEL
BUN/Creatinine Ratio: 21 (ref 12–28)
BUN: 15 mg/dL (ref 8–27)
CO2: 27 mmol/L (ref 20–29)
Calcium: 9.3 mg/dL (ref 8.7–10.3)
Chloride: 103 mmol/L (ref 96–106)
Creatinine, Ser: 0.73 mg/dL (ref 0.57–1.00)
GFR calc Af Amer: 96 mL/min/{1.73_m2} (ref >59)
GFR calc non Af Amer: 84 mL/min/{1.73_m2} (ref >59)
Glucose: 110 mg/dL — ABNORMAL HIGH (ref 65–99)
Potassium: 4.4 mmol/L (ref 3.5–5.2)
Sodium: 143 mmol/L (ref 134–144)

## 2018-10-06 LAB — HEPATIC FUNCTION PANEL
ALT: 15 IU/L (ref 0–32)
AST: 13 IU/L (ref 0–40)
Albumin: 4.4 g/dL (ref 3.8–4.8)
Alkaline Phosphatase: 80 IU/L (ref 39–117)
Bilirubin Total: 0.3 mg/dL (ref 0.0–1.2)
Bilirubin, Direct: 0.11 mg/dL (ref 0.00–0.40)
Total Protein: 6.8 g/dL (ref 6.0–8.5)

## 2018-10-06 LAB — LIPID PANEL
Chol/HDL Ratio: 2.6 ratio (ref 0.0–4.4)
Cholesterol, Total: 136 mg/dL (ref 100–199)
HDL: 52 mg/dL (ref >39)
LDL Calculated: 68 mg/dL (ref 0–99)
TRIGLYCERIDES: 79 mg/dL (ref 0–149)
VLDL CHOLESTEROL CAL: 16 mg/dL (ref 5–40)

## 2018-10-06 LAB — HEMOGLOBIN A1C
ESTIMATED AVERAGE GLUCOSE: 146 mg/dL
Hgb A1c MFr Bld: 6.7 % — ABNORMAL HIGH (ref 4.8–5.6)

## 2018-10-13 ENCOUNTER — Telehealth: Payer: Self-pay | Admitting: Family Medicine

## 2018-10-13 NOTE — Telephone Encounter (Signed)
Fax from [facility] regarding patient Bone Density and/or osteoporosis. Form in provider office for review.

## 2018-10-15 ENCOUNTER — Telehealth: Payer: Self-pay | Admitting: Family Medicine

## 2018-10-15 DIAGNOSIS — Z780 Asymptomatic menopausal state: Secondary | ICD-10-CM

## 2018-10-15 DIAGNOSIS — M810 Age-related osteoporosis without current pathological fracture: Secondary | ICD-10-CM

## 2018-10-15 NOTE — Telephone Encounter (Signed)
See telephone message Will need new bone density study

## 2018-10-15 NOTE — Telephone Encounter (Signed)
Patient needs a up-to-date bone density order Please let the patient know that her last bone density was over 2 years ago Based on standards this is necessary for her to complete especially since she has had recent ankle fracture Please set up bone density

## 2018-10-16 ENCOUNTER — Other Ambulatory Visit: Payer: Self-pay | Admitting: *Deleted

## 2018-10-16 NOTE — Telephone Encounter (Signed)
See other message. Bone density scheduled and pt notified.

## 2018-10-16 NOTE — Addendum Note (Signed)
Addended by: Carmelina Noun on: 10/16/2018 10:34 AM   Modules accepted: Orders

## 2018-10-16 NOTE — Telephone Encounter (Signed)
Pt notified of appt for bone density at [facility] feb 24th register 10:15. No calcium supp for 48 hours prior to test and bring a list of meds. Pt verbalized understanding.

## 2018-10-23 ENCOUNTER — Ambulatory Visit (HOSPITAL_COMMUNITY)
Admission: RE | Admit: 2018-10-23 | Discharge: 2018-10-23 | Disposition: A | Discharge status: Home / Self Care | Payer: Medicare HMO | Source: Ambulatory Visit | Attending: Family Medicine | Admitting: Family Medicine

## 2018-10-23 DIAGNOSIS — Z780 Asymptomatic menopausal state: Secondary | ICD-10-CM | POA: Insufficient documentation

## 2018-10-23 DIAGNOSIS — M810 Age-related osteoporosis without current pathological fracture: Secondary | ICD-10-CM | POA: Diagnosis not present

## 2018-10-23 DIAGNOSIS — E2839 Other primary ovarian failure: Secondary | ICD-10-CM | POA: Diagnosis not present

## 2018-10-26 ENCOUNTER — Encounter: Payer: Self-pay | Admitting: Family Medicine

## 2018-11-07 ENCOUNTER — Encounter: Payer: Self-pay | Admitting: Family Medicine

## 2018-11-07 ENCOUNTER — Ambulatory Visit (INDEPENDENT_AMBULATORY_CARE_PROVIDER_SITE_OTHER): Payer: Medicare HMO | Admitting: Family Medicine

## 2018-11-07 VITALS — BP 140/86 | Wt 233.8 lb

## 2018-11-07 DIAGNOSIS — E1140 Type 2 diabetes mellitus with diabetic neuropathy, unspecified: Secondary | ICD-10-CM

## 2018-11-07 DIAGNOSIS — I10 Essential (primary) hypertension: Secondary | ICD-10-CM | POA: Diagnosis not present

## 2018-11-07 DIAGNOSIS — E785 Hyperlipidemia, unspecified: Secondary | ICD-10-CM | POA: Diagnosis not present

## 2018-11-07 DIAGNOSIS — R0789 Other chest pain: Secondary | ICD-10-CM

## 2018-11-07 DIAGNOSIS — E1169 Type 2 diabetes mellitus with other specified complication: Secondary | ICD-10-CM | POA: Diagnosis not present

## 2018-11-07 MED ORDER — HYDROCHLOROTHIAZIDE 25 MG PO TABS: 25.0000 mg | ORAL_TABLET | Freq: Every day | ORAL | 1 refills | Status: DC

## 2018-11-07 MED ORDER — PRAVASTATIN SODIUM 20 MG PO TABS: 20.0000 mg | ORAL_TABLET | Freq: Every day | ORAL | 1 refills | Status: DC

## 2018-11-07 MED ORDER — LOSARTAN POTASSIUM 50 MG PO TABS: 50.0000 mg | ORAL_TABLET | Freq: Every day | ORAL | 1 refills | Status: DC

## 2018-11-07 MED ORDER — PANTOPRAZOLE SODIUM 40 MG PO TBEC: 40.0000 mg | DELAYED_RELEASE_TABLET | Freq: Every day | ORAL | 1 refills | Status: DC

## 2018-11-07 MED ORDER — PREDNISONE 20 MG PO TABS: ORAL_TABLET | ORAL | 0 refills | Status: DC

## 2018-11-07 MED ORDER — AZITHROMYCIN 250 MG PO TABS: ORAL_TABLET | ORAL | 0 refills | Status: DC

## 2018-11-07 NOTE — Progress Notes (Signed)
Subjective:    Patient ID: [name], female    DOB: [birth year], 73 y.o.   MRN: [id number]  Diabetes  She presents for her follow-up diabetic visit. She has type 2 diabetes mellitus. There are no hypoglycemic associated symptoms. Pertinent negatives for hypoglycemia include no confusion or dizziness. There are no diabetic associated symptoms. Pertinent negatives for diabetes include no chest pain, no fatigue, no polydipsia, no polyphagia and no weakness. There are no hypoglycemic complications. There are no diabetic complications.  Hypertension  This is a chronic problem. Pertinent negatives include no chest pain or shortness of breath. There are no compliance problems.    Pt has went through death of husband. Pt states she has not been taking BP regular.  Under a lot of stress but she is trying to the best can she does not eat much she has been losing some weight  Patient here for follow-up regarding cholesterol.  The patient does have hyperlipidemia.  Patient does try to maintain a reasonable diet.  Patient does take the medication on a regular basis.  Denies missing a dose.  The patient denies any obvious side effects.  Prior blood work results reviewed with the patient.  The patient is aware of his cholesterol goals and the need to keep it under good control to lessen the risk of disease.  Intermittent chest discomfort left side sometimes left arm denies severe pain denies tightness wheezing shortness of breath sweats diaphoresis.  Pain comes and goes randomly lasts a few minutes to sometimes 30 to 60 minutes at a time patient will be seeing cardiology later this month for regular visit patient does do a fair amount of arm exercises with hand dumbbells  Review of Systems  Constitutional: Negative for activity change, appetite change and fatigue.  HENT: Negative for congestion and rhinorrhea.   Respiratory: Negative for cough and shortness of breath.   Cardiovascular: Negative  for chest pain and leg swelling.  Gastrointestinal: Negative for abdominal pain and diarrhea.  Endocrine: Negative for polydipsia and polyphagia.  Skin: Negative for color change.  Neurological: Negative for dizziness and weakness.  Psychiatric/Behavioral: Negative for behavioral problems and confusion.       Objective:   Physical Exam Vitals signs reviewed.  Constitutional:      General: She is not in acute distress. HENT:     Head: Normocephalic and atraumatic.  Eyes:     General:        Right eye: No discharge.        Left eye: No discharge.  Neck:     Trachea: No tracheal deviation.  Cardiovascular:     Rate and Rhythm: Normal rate and regular rhythm.     Heart sounds: Normal heart sounds. No murmur.  Pulmonary:     Effort: Pulmonary effort is normal. No respiratory distress.     Breath sounds: Normal breath sounds.  Lymphadenopathy:     Cervical: No cervical adenopathy.  Skin:    General: Skin is warm and dry.  Neurological:     Mental Status: She is alert.     Coordination: Coordination normal.  Psychiatric:        Behavior: Behavior normal.           Assessment & Plan:  25 minutes was spent with the patient.  This statement verifies that 25 minutes was indeed spent with the patient.  More than 50% of this visit-total duration of the visit-was spent in counseling and coordination of care. The issues  that the patient came in for today as reflected in the diagnosis (s) please refer to documentation for further details.  Intermittent chest discomfort hard to discern for certain what is causing this I think is most likely musculoskeletal EKG looks stable but she will see cardiology in a couple weeks they will evaluate further may need to have stress testing  Blood pressure good control continue current measures  Hyperlipidemia previous labs reviewed continue current measures watch diet closely  Morbid obesity continue current measures watch diet try to continue  to lose weight

## 2018-11-08 NOTE — Progress Notes (Signed)
Thanks [place], she has had a long history of chest pain with negative workups in the past. We will get her in sooner to re[place]alaute. Lattie Haw can we schedule this patient for 940AM on Friday   J Lisandra Mathisen DM

## 2018-11-09 ENCOUNTER — Other Ambulatory Visit: Payer: Self-pay

## 2018-11-09 ENCOUNTER — Encounter: Payer: Self-pay | Admitting: Nurse Practitioner

## 2018-11-09 ENCOUNTER — Ambulatory Visit (INDEPENDENT_AMBULATORY_CARE_PROVIDER_SITE_OTHER): Payer: Medicare HMO | Admitting: Nurse Practitioner

## 2018-11-09 VITALS — BP 130/53 | HR 61 | Temp 97.0°F | Ht 64.0 in | Wt 234.4 lb

## 2018-11-09 DIAGNOSIS — K589 Irritable bowel syndrome without diarrhea: Secondary | ICD-10-CM

## 2018-11-09 DIAGNOSIS — R634 Abnormal weight loss: Secondary | ICD-10-CM

## 2018-11-09 DIAGNOSIS — R197 Diarrhea, unspecified: Secondary | ICD-10-CM | POA: Diagnosis not present

## 2018-11-09 MED ORDER — DICYCLOMINE HCL 10 MG PO CAPS: 10.0000 mg | ORAL_CAPSULE | Freq: Three times a day (TID) | ORAL | 3 refills | Status: DC | PRN

## 2018-11-09 NOTE — Progress Notes (Signed)
Referring Provider: [name], MD Primary Care Physician:  [name], MD Primary GI:  Dr. Oneida Alar  Chief Complaint  Patient presents with  . Diarrhea    usually after eating  . Abdominal Pain    HPI:   [name] is a 73 y.o. female who presents for follow-up on diarrhea and abdominal pain.  The patient was last seen in our office 03/13/2018 for constipation, IBS, GERD.  EGD up-to-date 2018 with mild gastritis recommended continue PPI.  History of acute diverticulitis in September 2017.  She tried and failed multiple medications and Linzess would cost $400 a month with her insurance.  Some intermittent unintentional weight loss.  At her last visit she was still having constipation and taking Colace once a day but this causes bloating.  Also bloating with MiraLAX as well so she stopped those.  She is not wanting to try lactulose which is the only lower priced covered option on her insurance.  Still with straining.  She is unsure if she tried Linzess.  Abdominal pain when very constipated.  Significant bloating and states full "all the time."  GERD doing well on PPI.  We were able to provide her 2 boxes of Linzess samples, recommended over-the-counter laxatives to find one that may work, follow-up in 6 months.  Previous colonoscopy 09/24/2014 which found a 3 to 4 mm sessile transverse colon polyp and small internal hemorrhoids.  No obvious source for lower abdominal pain and intermittent GI upset.  Recommended high-fiber diet.surgical pathology found the polyp to be tubular adenoma and recommended repeat colonoscopy in 5 years (08/2019).  Today she states she's "hanging in there." She lost her husband in October 2019. Since then she been having diarrhea postprandially associated with abdominal pain; pain typically improves after a bowel movement. PCP told her it was stress related. Has bowel movement every time she eats. Has been eating less frequently due to this and has lost  15 lbs in the past several months. Denies vomiting. Gets nauseated when she looks at food. Denies hematochezia, melena, fever, chills. Has been ahving frequent chest pain since husband's passing and has an appointment with cardiology coming up. Denies dyspnea, dizziness, lightheadedness, syncope, near syncope. Denies any other upper or lower GI symptoms.  Tried dicyclomine which helped somewhat 1-2 years ago but made her feel bad; hasn't taken it in 1-2 years.  Past Medical History:  Diagnosis Date  . Blood clot of artery under arm ([place])   . Cyst on liver  . Depression   . Diabetes mellitus    controlled by diet  . Diverticulitis   . DVT of axillary vein, acute right ([place]) 11/24/2012   Dx on Jan 28, 2012.    . Fibrocystic breast disease   . Hepatic cyst   . Hypertension   . IBS (irritable bowel syndrome)   . Impaired glucose tolerance   . Menopause     Past Surgical History:  Procedure Laterality Date  . ABDOMINAL HYSTERECTOMY    . BACK SURGERY    . BREAST SURGERY  right   cyst removed  . CAST APPLICATION  1/61/0960   Procedure: CAST APPLICATION;  Surgeon: Carole Civil, MD;  Location: [facility] ORS;  Service: Orthopedics;  Laterality: Right;  procedure room   . CHOLECYSTECTOMY    . COLONOSCOPY  12/04/2004   NUR:Few small diverticula at sigmoid colon/Small cecal polyp ablated   . COLONOSCOPY N/A 09/24/2014   SLF: small internal hemorrhoids  . ESOPHAGOGASTRODUODENOSCOPY  N/A 02/02/2016   Procedure: ESOPHAGOGASTRODUODENOSCOPY (EGD);  Surgeon: Danie Binder, MD;  Location: [facility] END[facility] SUITE;  Service: Endoscopy;  Laterality: N/A;  930   . KNEE ARTHROCENTESIS  left  . TUBAL LIGATION      Current Outpatient Medications  Medication Sig Dispense Refill  . acarbose (PRECOSE) 25 MG tablet     . acetaminophen (TYLENOL) 500 MG tablet Take 1,000 mg by mouth every 6 (six) hours as needed for mild pain, moderate pain or headache.     . Alcohol Swabs (B-D SINGLE USE SWABS REGULAR) PADS USE EVERY  DAY 100 each 5  . Ca Phosphate-Cholecalciferol (CALCIUM/VITAMIN D3 GUMMIES) 250-350 MG-UNIT CHEW     . Cholecalciferol (D-5000) 5000 units TABS     . clopidogrel (PLAVIX) 75 MG tablet TAKE 1 TABLET BY MOUTH ONCE DAILY 90 tablet 3  . hydrochlorothiazide (HYDRODIURIL) 25 MG tablet Take 1 tablet (25 mg total) by mouth daily. 90 tablet 1  . losartan (COZAAR) 50 MG tablet Take 1 tablet (50 mg total) by mouth daily. 90 tablet 1  . Multiple Vitamins-Minerals (MULTIVITAMIN WITH MINERALS) tablet Take 1 tablet by mouth daily.    . pantoprazole (PROTONIX) 40 MG tablet Take 1 tablet (40 mg total) by mouth daily. 90 tablet 1  . pravastatin (PRAVACHOL) 20 MG tablet Take 1 tablet (20 mg total) by mouth at bedtime. 90 tablet 1  . sertraline (ZOLOFT) 50 MG tablet Take 1 tablet (50 mg total) by mouth daily. 30 tablet 2  . TRUEPLUS LANCETS 33G MISC TEST EVERY DAY 100 each 5   No current facility-administered medications for this visit.     Allergies as of 11/09/2018 - Review Complete 11/09/2018  Allergen Reaction Noted  . Norvasc [amlodipine besylate] Other (See Comments) 11/21/2012  . Penicillins Hives and Other (See Comments)   . Advair diskus [fluticasone-salmeterol] Palpitations 11/21/2012  . Metformin and related Other (See Comments) 09/28/2013    Family History  Problem Relation Age of Onset  . Stroke Mother   . Heart attack Mother   . Cancer Sister   . Diabetes Sister   . Seizures Brother   . Diabetes Brother   . Heart disease Brother   . Kidney disease Son   . Colon cancer Neg Hx     Social History   Socioeconomic History  . Marital status: Married    Spouse name: Not on file  . Number of children: Not on file  . Years of education: Not on file  . Highest education level: Not on file  Occupational History  . Occupation: Retired  Scientific laboratory technician  . Financial resource strain: Not on file  . Food insecurity:    Worry: Not on file    Inability: Not on file  . Transportation needs:     Medical: Not on file    Non-medical: Not on file  Tobacco Use  . Smoking status: Never Smoker  . Smokeless tobacco: Never Used  . Tobacco comment: Never smoked  Substance and Sexual Activity  . Alcohol use: No    Alcohol/week: 0.0 standard drinks  . Drug use: No  . Sexual activity: Yes  Lifestyle  . Physical activity:    Days per week: Not on file    Minutes per session: Not on file  . Stress: Not on file  Relationships  . Social connections:    Talks on phone: Not on file    Gets together: Not on file    Attends religious service: Not on file  Active member of club or organization: Not on file    Attends meetings of clubs or organizations: Not on file    Relationship status: Not on file  Other Topics Concern  . Not on file  Social History Narrative  . Not on file    Review of Systems: Complete ROS negative except as per HPI.   Physical Exam: BP (!) 130/53   Pulse 61   Temp (!) 97 F (36.1 C) (Oral)   Ht 5\' 4"  (1.626 m)   Wt 234 lb 6.4 oz (106.3 kg)   BMI 40.23 kg/m  General:   Alert and oriented. Pleasant and cooperative. Well-nourished and well-developed.  Eyes:  Without icterus, sclera clear and conjunctiva pink.  Ears:  Normal auditory acuity. Cardiovascular:  S1, S2 present without murmurs appreciated. Normal pulses noted. Extremities without clubbing or edema. Respiratory:  Clear to auscultation bilaterally. No wheezes, rales, or rhonchi. No distress.  Gastrointestinal:  +BS, soft, non-tender and non-distended. No HSM noted. No guarding or rebound. No masses appreciated.  Rectal:  Deferred  Musculoskalatal:  Symmetrical without gross deformities. Neurologic:  Alert and oriented x4;  grossly normal neurologically. Psych:  Alert and cooperative. Normal mood and affect. Heme/Lymph/Immune: No excessive bruising noted.    11/09/2018 10:55 AM   Disclaimer: This note was dictated with voice recognition software. Similar sounding words can inadvertently be  transcribed and may not be corrected upon review.

## 2018-11-09 NOTE — Patient Instructions (Addendum)
Your health issues we discussed today were:   Diarrhea and abdominal pain: 1. I am sending in dicyclomine (Bentyl) to your pharmacy. Takes this with or before meals to help with abdoinal pain and diarrhea 2. You can also try Imodium as needed for diarrhea 3. Call us in 2-4 weeks and let us know if it is helping  Overall I recommend:  1. Use Glucerna to help get adequate nutrition if you're not eating 2. Follow-up in 2 months 3. Call us if you have any questions or concerns    Because of recent events of COVID-19 ("Coronavirus"), follow CDC recommendations:  1. Wash your hand frequently 2. Avoid touching your face 3. Stay away from people who are sick 4. If you have symptoms such as fever, cough, shortness of breath then call your healthcare provider for further guidance 5. If you are sick, STAY AT HOME unless otherwise directed by your healthcare provider.     At [facility] we value your feedback. You may receive a survey about your visit today. Please share your experience as we strive to create trusting relationships with our patients to provide genuine, compassionate, quality care.  We appreciate your understanding and patience as we review any laboratory studies, imaging, and other diagnostic tests that are ordered as we care for you. Our office policy is 5 business days for review of these results, and any emergent or urgent results are addressed in a timely manner for your best interest. If you do not hear from our office in 1 week, please contact us.   We also encourage the use of MyChart, which contains your medical information for your review as well. If you are not enrolled in this feature, an access code is on this after visit summary for your convenience. Thank you for allowing [place] to be involved in your care.  It was great to see you today!  I am sorry to hear of your husband's passing

## 2018-11-10 ENCOUNTER — Encounter: Payer: Self-pay | Admitting: Cardiology

## 2018-11-10 ENCOUNTER — Ambulatory Visit: Payer: Medicare HMO | Admitting: Cardiology

## 2018-11-10 ENCOUNTER — Other Ambulatory Visit: Payer: Self-pay

## 2018-11-10 VITALS — BP 144/58 | HR 66 | Ht 64.0 in | Wt 234.0 lb

## 2018-11-10 DIAGNOSIS — R0789 Other chest pain: Secondary | ICD-10-CM | POA: Diagnosis not present

## 2018-11-10 NOTE — Patient Instructions (Signed)

## 2018-11-10 NOTE — Progress Notes (Signed)
Clinical Summary [name] is a 73 y.o.female seen today for follow up of the following medical problems. This is a focused visit on recent symptoms of chest pain.   1. Chest pain - long history of chest pain - caths in 2005 and 2012 without significant disease - nuclear stress 07/2016 without ischemia - she reports asked not to take aspirin in the past by GI.     -recent chest pain mostly with laying down, heaviness left sided down arm. +diaphoresis. Fatigue. 7/10 in severity. Worst with position. Comes and goes. Lasts 20-30 min, comes and goes. Tender to palpation,worst with arm movement. Different from pain - recent high levels stress  - does water aerobics few times a week, tolerates without troubles - tender to palpation    SH: oldest son has CKD IV, pending dialysis. 10 yo sistershe helps take care of.   Past Medical History:  Diagnosis Date  . Blood clot of artery under arm ([place])   . Cyst on liver  . Depression   . Diabetes mellitus    controlled by diet  . Diverticulitis   . DVT of axillary vein, acute right ([place]) 11/24/2012   Dx on Jan 28, 2012.    . Fibrocystic breast disease   . Hepatic cyst   . Hypertension   . IBS (irritable bowel syndrome)   . Impaired glucose tolerance   . Menopause      Allergies  Allergen Reactions  . Norvasc [Amlodipine Besylate] Other (See Comments)    Reaction:  Fatigue   . Penicillins Hives and Other (See Comments)    Has patient had a PCN reaction causing immediate rash, facial/tongue/throat swelling, SOB or lightheadedness with hypotension: Yes Has patient had a PCN reaction causing severe rash involving mucus membranes or skin necrosis: No Has patient had a PCN reaction that required hospitalization No Has patient had a PCN reaction occurring within the last 10 years: No If all of the above answers are "NO", then may proceed with Cephalosporin use.   . Advair Diskus [Fluticasone-Salmeterol] Palpitations   . Metformin And Related Other (See Comments)    Reaction:  GI upset      Current Outpatient Medications  Medication Sig Dispense Refill  . acarbose (PRECOSE) 25 MG tablet     . acetaminophen (TYLENOL) 500 MG tablet Take 1,000 mg by mouth every 6 (six) hours as needed for mild pain, moderate pain or headache.     . Alcohol Swabs (B-D SINGLE USE SWABS REGULAR) PADS USE EVERY DAY 100 each 5  . Ca Phosphate-Cholecalciferol (CALCIUM/VITAMIN D3 GUMMIES) 250-350 MG-UNIT CHEW     . Cholecalciferol (D-5000) 5000 units TABS     . clopidogrel (PLAVIX) 75 MG tablet TAKE 1 TABLET BY MOUTH ONCE DAILY 90 tablet 3  . dicyclomine (BENTYL) 10 MG capsule Take 1 capsule (10 mg total) by mouth 3 (three) times daily as needed for spasms (with/before meals). 30 capsule 3  . hydrochlorothiazide (HYDRODIURIL) 25 MG tablet Take 1 tablet (25 mg total) by mouth daily. 90 tablet 1  . losartan (COZAAR) 50 MG tablet Take 1 tablet (50 mg total) by mouth daily. 90 tablet 1  . Multiple Vitamins-Minerals (MULTIVITAMIN WITH MINERALS) tablet Take 1 tablet by mouth daily.    . pantoprazole (PROTONIX) 40 MG tablet Take 1 tablet (40 mg total) by mouth daily. 90 tablet 1  . pravastatin (PRAVACHOL) 20 MG tablet Take 1 tablet (20 mg total) by mouth at bedtime. 90 tablet 1  .  sertraline (ZOLOFT) 50 MG tablet Take 1 tablet (50 mg total) by mouth daily. 30 tablet 2  . TRUEPLUS LANCETS 33G MISC TEST EVERY DAY 100 each 5   No current facility-administered medications for this visit.      Past Surgical History:  Procedure Laterality Date  . ABDOMINAL HYSTERECTOMY    . BACK SURGERY    . BREAST SURGERY  right   cyst removed  . CAST APPLICATION  2/37/6283   Procedure: CAST APPLICATION;  Surgeon: Carole Civil, MD;  Location: [facility];  Service: Orthopedics;  Laterality: Right;  procedure room   . CHOLECYSTECTOMY    . COLONOSCOPY  12/04/2004   NUR:Few small diverticula at sigmoid colon/Small cecal polyp ablated   . COLONOSCOPY  N/A 09/24/2014   SLF: small internal hemorrhoids  . ESOPHAGOGASTRODUODENOSCOPY N/A 02/02/2016   Procedure: ESOPHAGOGASTRODUODENOSCOPY (EGD);  Surgeon: Danie Binder, MD;  Location: [facility];  Service: Endoscopy;  Laterality: N/A;  930   . KNEE ARTHROCENTESIS  left  . TUBAL LIGATION       Allergies  Allergen Reactions  . Norvasc [Amlodipine Besylate] Other (See Comments)    Reaction:  Fatigue   . Penicillins Hives and Other (See Comments)    Has patient had a PCN reaction causing immediate rash, facial/tongue/throat swelling, SOB or lightheadedness with hypotension: Yes Has patient had a PCN reaction causing severe rash involving mucus membranes or skin necrosis: No Has patient had a PCN reaction that required hospitalization No Has patient had a PCN reaction occurring within the last 10 years: No If all of the above answers are "NO", then may proceed with Cephalosporin use.   . Advair Diskus [Fluticasone-Salmeterol] Palpitations  . Metformin And Related Other (See Comments)    Reaction:  GI upset       Family History  Problem Relation Age of Onset  . Stroke Mother   . Heart attack Mother   . Cancer Sister   . Diabetes Sister   . Seizures Brother   . Diabetes Brother   . Heart disease Brother   . Kidney disease Son   . Colon cancer Neg Hx      Social History [name] reports that she has never smoked. She has never used smokeless tobacco. [name] reports no history of alcohol use.   Review of Systems CONSTITUTIONAL: No weight loss, fever, chills, weakness or fatigue.  HEENT: Eyes: No visual loss, blurred vision, double vision or yellow sclerae.No hearing loss, sneezing, congestion, runny nose or sore throat.  SKIN: No rash or itching.  CARDIOVASCULAR: per hpi RESPIRATORY:per hpi GASTROINTESTINAL: No anorexia, nausea, vomiting or diarrhea. No abdominal pain or blood.  GENITOURINARY: No burning on urination, no polyuria NEUROLOGICAL: No headache,  dizziness, syncope, paralysis, ataxia, numbness or tingling in the extremities. No change in bowel or bladder control.  MUSCULOSKELETAL: No muscle, back pain, joint pain or stiffness.  LYMPHATICS: No enlarged nodes. No history of splenectomy.  PSYCHIATRIC: No history of depression or anxiety.  ENDOCRINOLOGIC: No reports of sweating, cold or heat intolerance. No polyuria or polydipsia.  [name]   Physical Examination Today's Vitals   11/10/18 0950  BP: (!) 144/58  Pulse: 66  SpO2: 98%  Weight: 234 lb (106.1 kg)  Height: 5\' 4"  (1.626 m)   Body mass index is 40.17 kg/m.  Gen: resting comfortably, no acute distress HEENT: no scleral icterus, pupils equal round and reactive, no palptable cervical adenopathy,  CV: RRR, no m/r/g, no jvd Resp: Clear to auscultation  bilaterally GI: abdomen is soft, non-tender, non-distended, normal bowel sounds, no hepatosplenomegaly MSK: extremities are warm, no edema.  Skin: warm, no rash Neuro:  no focal deficits Psych: appropriate affect   Diagnostic Studies 07/2016 Nuclear stress test  There was no ST segment deviation noted during stress.  The study is normal. No ischemia or infarction.  This is a low risk study.  Nuclear stress EF: 75%.   05/2011 cath ANGIOGRAPHIC FINDINGS: 1. The left main coronary artery was a short segment and had no disease. 2. The left anterior descending was a large vessel that coursed to the apex and gave off a moderate-sized diagonal Keldrick Pomplun. The midportion of the left anterior descending artery had a mild 20% stenosis. The diagonal Dorothymae Maciver was free of any disease. 3. Circumflex artery is a moderate-sized vessel that gives off a moderate-sized bifurcating obtuse marginal Rhea Kaelin. This vessel is free of any disease. 4. The right coronary artery is a moderate-sized dominant vessel with no evidence of disease. 5. Left ventricular angiogram was performed in the RAO projection and it  showed normal left ventricular systolic function with ejection fraction of 65-70%.  IMPRESSION: 1. Mild nonobstructive coronary artery disease. 2. Normal left ventricular systolic function. 3. Noncardiac chest pain.   06/2004 cath Left main coronary artery was normal.  The left anterior descending artery was normal.  Circumflex coronary artery is normal.  Right coronary artery was somewhat small but dominant. It was normal.  Right ventriculography: The right ventriculography showed hyperdynamic LV function, ejection fraction of 65%. There was no gradient across the aortic valve and no MR. The aortic pressure was 129/66, LV pressure is 130/90.  IMPRESSION: The patient has no evidence of significant coronary artery disease on RAO ventriculogram. The ascending aorta and descending thoracic aorta looked fine with no evidence of dissection. Her chest pain would appear to be noncardiac in etiology. She will be discharged later today to follow up with her primary care M.D.  The patient tolerated the procedure well.    Assessment and Plan  1. Chest pain - long history, multiple negative workups in the past as outlined above - recent symptoms are noncardiac, worst with position, left chest wall is tender to palpation today. - request EKG from pcp. Otherwise no further workup planned.          Arnoldo Lenis, M.D.

## 2018-11-11 ENCOUNTER — Telehealth: Payer: Self-pay | Admitting: Family Medicine

## 2018-11-11 NOTE — Telephone Encounter (Signed)
[name] (medical records) Please send a copy of the patient's most recent EKG to the cardiologist thank you Make this to the attention of Dr. Harl Bowie And also Debbora Lacrosse CMA

## 2018-11-13 NOTE — Assessment & Plan Note (Signed)
Diarrhea in addition to abdominal pain in the setting of significant stress in the passing of her husband.  Management as per above to include Bentyl and Imodium with a progress report in 2 to 4 weeks.  Follow-up in 2 months.

## 2018-11-13 NOTE — Assessment & Plan Note (Signed)
The patient has noted 15 pound weight loss in the past several months, notes that with the passing of her husband and significant stress she has not been very hungry and not had much of an appetite.  She has not been trying Ensure because she is diabetic.  I recommended she try Glucerna 2 times a day as needed to help ensure adequate nutrition.  She states she will try to do this.  Follow-up in 2 months.

## 2018-11-13 NOTE — Assessment & Plan Note (Signed)
Notices symptoms consistent with irritable bowel syndrome including diarrhea and abdominal pain which improves after a bowel movement.  Recently lost her husband and has had significant symptoms since then.  At this point I will start her on Bentyl 10 mg 3 times a day with meals and consider Imodium as needed.  Follow-up in 2 months.  Call in 2 to 4 weeks with progress report.

## 2018-11-14 ENCOUNTER — Encounter: Payer: Self-pay | Admitting: Family Medicine

## 2018-11-24 ENCOUNTER — Ambulatory Visit: Payer: Medicare HMO | Admitting: Cardiology

## 2018-11-27 ENCOUNTER — Other Ambulatory Visit: Payer: Self-pay | Admitting: Family Medicine

## 2018-12-02 ENCOUNTER — Other Ambulatory Visit: Payer: Self-pay | Admitting: Family Medicine

## 2018-12-25 NOTE — Progress Notes (Signed)
REVIEWED-NO ADDITIONAL RECOMMENDATIONS. 

## 2019-01-15 ENCOUNTER — Encounter: Payer: Self-pay | Admitting: Nurse Practitioner

## 2019-01-15 ENCOUNTER — Encounter: Payer: Self-pay | Admitting: Gastroenterology

## 2019-01-15 ENCOUNTER — Ambulatory Visit (INDEPENDENT_AMBULATORY_CARE_PROVIDER_SITE_OTHER): Payer: Medicare HMO | Admitting: Nurse Practitioner

## 2019-01-15 ENCOUNTER — Other Ambulatory Visit: Payer: Self-pay

## 2019-01-15 DIAGNOSIS — R197 Diarrhea, unspecified: Secondary | ICD-10-CM

## 2019-01-15 DIAGNOSIS — R634 Abnormal weight loss: Secondary | ICD-10-CM | POA: Diagnosis not present

## 2019-01-15 DIAGNOSIS — R1084 Generalized abdominal pain: Secondary | ICD-10-CM

## 2019-01-15 DIAGNOSIS — R109 Unspecified abdominal pain: Secondary | ICD-10-CM | POA: Insufficient documentation

## 2019-01-15 NOTE — Assessment & Plan Note (Signed)
Patient has persistent abdominal pain.  She has Bentyl but has not been taking it for abdominal pain, only for diarrhea.  Recommend she take Bentyl up to 3 times a day as needed for abdominal pain and/or for diarrhea.  Continue other medications otherwise and follow-up in 6 months.

## 2019-01-15 NOTE — Progress Notes (Signed)
Referring Provider: [name], MD Primary Care Physician:  [name], MD Primary GI:  Dr. Oneida Alar  NOTE: Service was provided via telemedicine and was requested by the patient due to COVID-19 pandemic.  Method of visit: Telephone  Patient Location: Home  Provider Location: Office  Reason for Phone Visit: Follow-up  The patient was consented to phone follow-up via telephone encounter including billing of the encounter (yes/no): Yes  Persons present on the phone encounter, with roles: Cousin  Total time (minutes) spent on medical discussion: 23 minutes  Chief Complaint  Patient presents with  . Abdominal Pain    lower abd  . Diarrhea    occ    HPI:   [name] is a 73 y.o. female who presents for virtual visit regarding: Follow-up on diarrhea and abdominal pain.  The patient was last seen in our office 11/09/2022 IBS, diarrhea, weight loss.  Chronic history of IBS, GERD.  EGD up-to-date and recommended PPI due to chronic gastritis.  History of diverticulitis.  Typically she has constipation and tried and failed multiple medications, noted Linzess for cost $400 a month.  Colace causes bloating as does MiraLAX.  Does not want to try lactulose.  Up-to-date 2016 with polyps, no source for lower abdominal pain, recommended high-fiber diet and repeat colonoscopy in 5 years (2021).  At her last visit she states she lost her husband in October 2019 and is been having postprandial abdominal pain and diarrhea with pain improving after bowel movement.  Likely stress related.  Eating less frequently due to her symptoms and has lost 15 pounds in several months.  Nausea associated with food.  Try dicyclomine which helped 1 to 2 years ago but made her feel bad and has not taken it since.  Recommended retrial of dicyclomine versus Imodium as needed, request progress report in 2 to 4 weeks, use concerning to help ensure adequate nutrition, follow-up in 2 months.  No  progress report was called our office.  Today she states she's good overall. Her diarrhea is "off and on" and has diarrhea 1-2 times a week. Bentyl helps. Has intermittent lower abdominal pain regularly but hasn't been taking Bentyl with her abdominal pain. Denies hematochezia, melena, fever, chills. Has had some weight loss due to stress, not eating as much as she should. Has Glucerna, but hasn't been using it much. She has a good support system, such as a friend who is a Theme park manager. Denies URI and flu-like symptoms. Denies loss of sense of taste or smell. Denies chest pain, dyspnea, dizziness, lightheadedness, syncope, near syncope. Denies any other upper or lower GI symptoms.  GERD well managed on Protonix.  Past Medical History:  Diagnosis Date  . Blood clot of artery under arm ([place])   . Cyst on liver  . Depression   . Diabetes mellitus    controlled by diet  . Diverticulitis   . DVT of axillary vein, acute right ([place]) 11/24/2012   Dx on Jan 28, 2012.    . Fibrocystic breast disease   . Hepatic cyst   . Hypertension   . IBS (irritable bowel syndrome)   . Impaired glucose tolerance   . Menopause     Past Surgical History:  Procedure Laterality Date  . ABDOMINAL HYSTERECTOMY    . BACK SURGERY    . BREAST SURGERY  right   cyst removed  . CAST APPLICATION  2/94/7654   Procedure: CAST APPLICATION;  Surgeon: Carole Civil, MD;  Location:  [facility];  Service: Orthopedics;  Laterality: Right;  procedure room   . CHOLECYSTECTOMY    . COLONOSCOPY  12/04/2004   NUR:Few small diverticula at sigmoid colon/Small cecal polyp ablated   . COLONOSCOPY N/A 09/24/2014   SLF: small internal hemorrhoids  . ESOPHAGOGASTRODUODENOSCOPY N/A 02/02/2016   Procedure: ESOPHAGOGASTRODUODENOSCOPY (EGD);  Surgeon: Danie Binder, MD;  Location: [facility];  Service: Endoscopy;  Laterality: N/A;  930   . KNEE ARTHROCENTESIS  left  . TUBAL LIGATION      Current Outpatient Medications  Medication Sig  Dispense Refill  . acarbose (PRECOSE) 25 MG tablet Take 25 mg by mouth daily.     [name] acetaminophen (TYLENOL) 500 MG tablet Take 1,000 mg by mouth every 6 (six) hours as needed for mild pain, moderate pain or headache.     . Alcohol Swabs (B-D SINGLE USE SWABS REGULAR) PADS USE EVERY DAY 100 each 5  . Ca Phosphate-Cholecalciferol (CALCIUM/VITAMIN D3 GUMMIES) 250-350 MG-UNIT CHEW daily.     . Cholecalciferol (D-5000) 5000 units TABS Take 1 tablet by mouth daily.     . clopidogrel (PLAVIX) 75 MG tablet TAKE 1 TABLET BY MOUTH ONCE DAILY 90 tablet 3  . dicyclomine (BENTYL) 10 MG capsule Take 1 capsule (10 mg total) by mouth 3 (three) times daily as needed for spasms (with/before meals). 30 capsule 3  . hydrochlorothiazide (HYDRODIURIL) 25 MG tablet TAKE 1 TABLET EVERY DAY 90 tablet 1  . losartan (COZAAR) 50 MG tablet TAKE 1 TABLET EVERY DAY 90 tablet 1  . Multiple Vitamins-Minerals (MULTIVITAMIN WITH MINERALS) tablet Take 1 tablet by mouth daily.    . pantoprazole (PROTONIX) 40 MG tablet TAKE 1 TABLET EVERY DAY 90 tablet 1  . pravastatin (PRAVACHOL) 20 MG tablet TAKE 1 TABLET AT BEDTIME 90 tablet 1  . TRUEPLUS LANCETS 33G MISC TEST EVERY DAY 100 each 5  . sertraline (ZOLOFT) 50 MG tablet Take 1 tablet (50 mg total) by mouth daily. (Patient not taking: Reported on 01/15/2019) 30 tablet 2   No current facility-administered medications for this visit.     Allergies as of 01/15/2019 - Review Complete 01/15/2019  Allergen Reaction Noted  . Norvasc [amlodipine besylate] Other (See Comments) 11/21/2012  . Penicillins Hives and Other (See Comments)   . Advair diskus [fluticasone-salmeterol] Palpitations 11/21/2012  . Metformin and related Other (See Comments) 09/28/2013    Family History  Problem Relation Age of Onset  . Stroke Mother   . Heart attack Mother   . Cancer Sister   . Diabetes Sister   . Seizures Brother   . Diabetes Brother   . Heart disease Brother   . Kidney disease Son   . Colon  cancer Neg Hx     Social History   Socioeconomic History  . Marital status: Married    Spouse name: Not on file  . Number of children: Not on file  . Years of education: Not on file  . Highest education level: Not on file  Occupational History  . Occupation: Retired  Scientific laboratory technician  . Financial resource strain: Not on file  . Food insecurity:    Worry: Not on file    Inability: Not on file  . Transportation needs:    Medical: Not on file    Non-medical: Not on file  Tobacco Use  . Smoking status: Never Smoker  . Smokeless tobacco: Never Used  . Tobacco comment: Never smoked  Substance and Sexual Activity  . Alcohol use: No  Alcohol/week: 0.0 standard drinks  . Drug use: No  . Sexual activity: Yes  Lifestyle  . Physical activity:    Days per week: Not on file    Minutes per session: Not on file  . Stress: Not on file  Relationships  . Social connections:    Talks on phone: Not on file    Gets together: Not on file    Attends religious service: Not on file    Active member of club or organization: Not on file    Attends meetings of clubs or organizations: Not on file    Relationship status: Not on file  Other Topics Concern  . Not on file  Social History Narrative  . Not on file    Review of Systems: Complete ROS negative except as per HPI.  Physical Exam: Note: limited exam due to virtual visit General:   Alert and oriented. Pleasant and cooperative. Well-nourished and well-developed.  Head:  Normocephalic and atraumatic. Eyes:  Without icterus, sclera clear and conjunctiva pink.  Ears:  Normal auditory acuity. Skin:  Intact without facial significant lesions or rashes. Neurologic:  Alert and oriented x4;  grossly normal neurologically. Psych:  Alert and cooperative. Normal mood and affect. Heme/Lymph/Immune: No excessive bruising noted.

## 2019-01-15 NOTE — Assessment & Plan Note (Addendum)
Unintentional weight loss since the passing of her husband end of last year.  She states she does not have an appetite.  We had a good discussion about "needing to live".  Recommend she have frequent small meals/snacks to include healthy items such as whole-grain carbohydrates, proteins, fruits, vegetables.  She can also use Glucerna in between if she is not particularly hungry in order to maintain adequate nutrition.  She verbalized understanding.  Recommend she call us for any worsening weight loss.  Colonoscopy up-to-date next due in 2021.  Follow-up in 6 months.

## 2019-01-15 NOTE — Progress Notes (Signed)
cc'ed to pcp °

## 2019-01-15 NOTE — Assessment & Plan Note (Signed)
Diarrhea, likely IBS related, which began becoming worse after her husband passed end of last year.  Bentyl has helped her diarrhea.  Recommend she continue Bentyl as needed.  She can also use Imodium.  Continue other medications and follow-up in 6 months.

## 2019-01-15 NOTE — Patient Instructions (Signed)
Your health issues we discussed today were:   Abdominal pain and diarrhea: 1. You can use Bentyl up to 3 times a day as needed for abdominal pain or for diarrhea 2. Call us if your symptoms become significantly worse or severe  Weight loss: 1. It is understandable that she would have a decreased appetite after your husband's passing 2. Try to eat frequent, smaller healthy snacks such as proteins, fruits, vegetables, whole grain carbohydrates 3. Use Glucerna as needed to help supplement your nutrition and make sure you stay well-nourished 4. Call us if you have any worsening weight loss in the meantime  Overall I recommend:  1. Continue your other current medications 2. Return for follow-up in 6 months 3. Call us if you have any questions or concerns.   Because of recent events of COVID-19 ("Coronavirus"), follow CDC recommendations:  1. Wash your hand frequently 2. Avoid touching your face 3. Stay away from people who are sick 4. If you have symptoms such as fever, cough, shortness of breath then call your healthcare provider for further guidance 5. If you are sick, STAY AT HOME unless otherwise directed by your healthcare provider. 6. Follow directions from state and national officials regarding staying safe   At [facility] we value your feedback. You may receive a survey about your visit today. Please share your experience as we strive to create trusting relationships with our patients to provide genuine, compassionate, quality care.  We appreciate your understanding and patience as we review any laboratory studies, imaging, and other diagnostic tests that are ordered as we care for you. Our office policy is 5 business days for review of these results, and any emergent or urgent results are addressed in a timely manner for your best interest. If you do not hear from our office in 1 week, please contact us.   We also encourage the use of MyChart, which contains your  medical information for your review as well. If you are not enrolled in this feature, an access code is on this after visit summary for your convenience. Thank you for allowing [place] to be involved in your care.  It was great to see you today!  I hope you have a great day!!

## 2019-02-07 ENCOUNTER — Other Ambulatory Visit: Payer: Self-pay

## 2019-02-07 ENCOUNTER — Other Ambulatory Visit: Payer: Medicare HMO

## 2019-02-07 DIAGNOSIS — R6889 Other general symptoms and signs: Secondary | ICD-10-CM | POA: Diagnosis not present

## 2019-02-07 DIAGNOSIS — Z20822 Contact with and (suspected) exposure to covid-19: Secondary | ICD-10-CM

## 2019-02-08 LAB — NOVEL CORONAVIRUS, NAA: SARS-CoV-2, NAA: NOT DETECTED

## 2019-02-09 ENCOUNTER — Other Ambulatory Visit (HOSPITAL_COMMUNITY): Payer: Self-pay | Admitting: Family Medicine

## 2019-02-09 ENCOUNTER — Telehealth: Payer: Self-pay | Admitting: Family Medicine

## 2019-02-09 DIAGNOSIS — N644 Mastodynia: Secondary | ICD-10-CM

## 2019-02-09 NOTE — Telephone Encounter (Signed)
Patient said she needs an order for a Diagnostic mamogram instead of a regular one due to past problems.

## 2019-02-12 NOTE — Telephone Encounter (Signed)
Patient is ok with setting up mammogram and has appt scheduled with Dr Nicki Reaper 03/07/2019 at 9 am

## 2019-02-12 NOTE — Telephone Encounter (Signed)
It would be reasonable to do diagnostic mammogram because of pain in the left breast Also patient should do a follow-up office visit at least by September (if patient has strong concerns of coming into the office it can be a virtual visit)

## 2019-02-12 NOTE — Telephone Encounter (Signed)
Called pt bc her last mammo was may 2019 and report states screening mammo in one year. Pt states she called [facility] to schedule and when they asked her if she was having any problems with her breast she told them she has pain in her left breast so they would not schedule. Told her she needed to call her doctor and get order for diagnostic mammo. Pt states she has had the left breast pain for awhile and has seen dr Nicki Reaper for it in the past. Does pt need office visit or can I schedule mammo and if so, screening or diagnostic. Please advise.

## 2019-02-13 NOTE — Telephone Encounter (Signed)
Diagnostic mammo scheduled [facility] June 23rd at 3:40 register 3:20. Pt notified of appt.

## 2019-02-20 ENCOUNTER — Other Ambulatory Visit: Payer: Self-pay

## 2019-02-20 ENCOUNTER — Ambulatory Visit (HOSPITAL_COMMUNITY)
Admission: RE | Admit: 2019-02-20 | Discharge: 2019-02-20 | Disposition: A | Discharge status: Home / Self Care | Payer: Medicare HMO | Source: Ambulatory Visit | Attending: Family Medicine | Admitting: Family Medicine

## 2019-02-20 ENCOUNTER — Ambulatory Visit (HOSPITAL_COMMUNITY): Payer: Medicare HMO

## 2019-02-20 DIAGNOSIS — N644 Mastodynia: Secondary | ICD-10-CM | POA: Insufficient documentation

## 2019-02-20 DIAGNOSIS — R928 Other abnormal and inconclusive findings on diagnostic imaging of breast: Secondary | ICD-10-CM | POA: Diagnosis not present

## 2019-02-28 ENCOUNTER — Other Ambulatory Visit: Payer: Self-pay | Admitting: Cardiology

## 2019-03-01 ENCOUNTER — Other Ambulatory Visit: Payer: Self-pay

## 2019-03-07 ENCOUNTER — Other Ambulatory Visit: Payer: Self-pay

## 2019-03-07 ENCOUNTER — Ambulatory Visit (INDEPENDENT_AMBULATORY_CARE_PROVIDER_SITE_OTHER): Payer: Medicare HMO | Admitting: Family Medicine

## 2019-03-07 DIAGNOSIS — I10 Essential (primary) hypertension: Secondary | ICD-10-CM | POA: Diagnosis not present

## 2019-03-07 DIAGNOSIS — E1169 Type 2 diabetes mellitus with other specified complication: Secondary | ICD-10-CM

## 2019-03-07 DIAGNOSIS — E785 Hyperlipidemia, unspecified: Secondary | ICD-10-CM | POA: Diagnosis not present

## 2019-03-07 DIAGNOSIS — R252 Cramp and spasm: Secondary | ICD-10-CM | POA: Diagnosis not present

## 2019-03-07 DIAGNOSIS — N644 Mastodynia: Secondary | ICD-10-CM

## 2019-03-07 DIAGNOSIS — R5383 Other fatigue: Secondary | ICD-10-CM

## 2019-03-07 NOTE — Progress Notes (Signed)
Subjective:    Patient ID: [name], female    DOB: 05/18/1946, 73 y.o.   MRN: [id number]  Hypertension This is a chronic problem. Associated symptoms include malaise/fatigue. Pertinent negatives include no chest pain or shortness of breath. (Dizziness at times when she stands up. BP machine is not accurate. Pt states she has been having cramps in her legs and hands for about 2 weeks. Pt states she sweats all the time and she feels drained. Pt did have COVID test back in June and the test came back negative. Pt has also had some abdominal pain. ) There are no compliance problems.   Patient does have history of reflux but states it has not bothered her severely lately  Patient states she is getting a lot of muscle cramps pain and discomfort in her hands and feet and legs sometimes cramps wake her up at night  Patient thinks her diabetes been doing okay but she really does not currently she does try to follow a healthy diet  Patient having a lot of fatigue tiredness feeling rundown denies being depressed but under a lot of stress in the family Virtual Visit via Video Note  I connected with [name] on 03/07/19 at  9:00 AM EDT by a video enabled telemedicine application and verified that I am speaking with the correct person using two identifiers.  Location: Patient: Home Provider: Office   I discussed the limitations of evaluation and management by telemedicine and the availability of in person appointments. The patient expressed understanding and agreed to proceed.  History of Present Illness:    Observations/Objective:   Assessment and Plan:   Follow Up Instructions:    I discussed the assessment and treatment plan with the patient. The patient was provided an opportunity to ask questions and all were answered. The patient agreed with the plan and demonstrated an understanding of the instructions.   The patient was advised to call back or seek an  in-person evaluation if the symptoms worsen or if the condition fails to improve as anticipated.  I provided 15 minutes of non-face-to-face time during this encounter.   Vicente Males, LPN    Review of Systems  Constitutional: Positive for fatigue and malaise/fatigue. Negative for activity change and appetite change.  HENT: Positive for rhinorrhea. Negative for congestion.   Respiratory: Positive for cough. Negative for shortness of breath.   Cardiovascular: Negative for chest pain and leg swelling.  Gastrointestinal: Negative for abdominal pain and diarrhea.  Endocrine: Negative for polydipsia and polyphagia.  Skin: Negative for color change.  Neurological: Negative for dizziness and weakness.  Psychiatric/Behavioral: Negative for behavioral problems and confusion.   25 minutes was spent with the patient.  This statement verifies that 25 minutes was indeed spent with the patient.  More than 50% of this visit-total duration of the visit-was spent in counseling and coordination of care. The issues that the patient came in for today as reflected in the diagnosis (s) please refer to documentation for furth.diagmed er details.     Objective:   Physical Exam  Today's visit was via telephone Physical exam was not possible for this visit       Assessment & Plan:  1. Essential hypertension Under reasonable control according to the patient taking her medications trying to watch her diet not getting as much exercise she will do a follow-up in September for a in person visit  2. Hyperlipidemia associated with type 2 diabetes mellitus ([place]) Previous labs  reviewed new labs ordered.  Watch diet continue current medicines follow-up by early September  3. Morbid obesity ([place]) Patient relates weight has not changed any trying to stay active but does not feel energetic tries watch how she eats she understands the importance of portion control and activity  4. Muscle cramps She relates a lot  of muscle cramps in arms legs as well as feet we will check lab work to see what this shows  5. Other fatigue Significant fatigue and tiredness hard to know if this is related to less activity since COVID or family stressors or health illnesses lab work ordered await results  6. Breast pain, left Intermittent breast pain on the left side mammogram was negative she will do a follow-up soon may need consultation with specialist

## 2019-03-15 DIAGNOSIS — E1169 Type 2 diabetes mellitus with other specified complication: Secondary | ICD-10-CM | POA: Diagnosis not present

## 2019-03-15 DIAGNOSIS — I10 Essential (primary) hypertension: Secondary | ICD-10-CM | POA: Diagnosis not present

## 2019-03-15 DIAGNOSIS — R5383 Other fatigue: Secondary | ICD-10-CM | POA: Diagnosis not present

## 2019-03-15 DIAGNOSIS — E785 Hyperlipidemia, unspecified: Secondary | ICD-10-CM | POA: Diagnosis not present

## 2019-03-15 DIAGNOSIS — R252 Cramp and spasm: Secondary | ICD-10-CM | POA: Diagnosis not present

## 2019-03-16 LAB — BASIC METABOLIC PANEL
BUN/Creatinine Ratio: 23 (ref 12–28)
BUN: 19 mg/dL (ref 8–27)
CO2: 26 mmol/L (ref 20–29)
Calcium: 9.8 mg/dL (ref 8.7–10.3)
Chloride: 101 mmol/L (ref 96–106)
Creatinine, Ser: 0.83 mg/dL (ref 0.57–1.00)
GFR calc Af Amer: 82 mL/min/{1.73_m2} (ref >59)
GFR calc non Af Amer: 71 mL/min/{1.73_m2} (ref >59)
Glucose: 116 mg/dL — ABNORMAL HIGH (ref 65–99)
Potassium: 4.4 mmol/L (ref 3.5–5.2)
Sodium: 140 mmol/L (ref 134–144)

## 2019-03-16 LAB — CBC WITH DIFFERENTIAL/PLATELET
Basophils Absolute: 0 10*3/uL (ref 0.0–0.2)
Basos: 1 %
EOS (ABSOLUTE): 0.2 10*3/uL (ref 0.0–0.4)
Eos: 3 %
Hematocrit: 41.6 % (ref 34.0–46.6)
Hemoglobin: 13.1 g/dL (ref 11.1–15.9)
Immature Grans (Abs): 0 10*3/uL (ref 0.0–0.1)
Immature Granulocytes: 0 %
Lymphocytes Absolute: 2.7 10*3/uL (ref 0.7–3.1)
Lymphs: 46 %
MCH: 25.1 pg — ABNORMAL LOW (ref 26.6–33.0)
MCHC: 31.5 g/dL (ref 31.5–35.7)
MCV: 80 fL (ref 79–97)
Monocytes Absolute: 0.4 10*3/uL (ref 0.1–0.9)
Monocytes: 7 %
Neutrophils Absolute: 2.6 10*3/uL (ref 1.4–7.0)
Neutrophils: 43 %
Platelets: 308 10*3/uL (ref 150–450)
RBC: 5.21 x10E6/uL (ref 3.77–5.28)
RDW: 16 % — ABNORMAL HIGH (ref 11.7–15.4)
WBC: 6 10*3/uL (ref 3.4–10.8)

## 2019-03-16 LAB — LIPID PANEL
Chol/HDL Ratio: 2.7 ratio (ref 0.0–4.4)
Cholesterol, Total: 139 mg/dL (ref 100–199)
HDL: 51 mg/dL (ref >39)
LDL Calculated: 74 mg/dL (ref 0–99)
Triglycerides: 70 mg/dL (ref 0–149)
VLDL Cholesterol Cal: 14 mg/dL (ref 5–40)

## 2019-03-16 LAB — HEPATIC FUNCTION PANEL
ALT: 18 IU/L (ref 0–32)
AST: 20 IU/L (ref 0–40)
Albumin: 4.2 g/dL (ref 3.7–4.7)
Alkaline Phosphatase: 81 IU/L (ref 39–117)
Bilirubin Total: 0.4 mg/dL (ref 0.0–1.2)
Bilirubin, Direct: 0.12 mg/dL (ref 0.00–0.40)
Total Protein: 7 g/dL (ref 6.0–8.5)

## 2019-03-16 LAB — MICROALBUMIN / CREATININE URINE RATIO
Creatinine, Urine: 249 mg/dL
Microalb/Creat Ratio: 5 mg/g creat (ref 0–29)
Microalbumin, Urine: 13.4 ug/mL

## 2019-03-16 LAB — HEMOGLOBIN A1C
Est. average glucose Bld gHb Est-mCnc: 154 mg/dL
Hgb A1c MFr Bld: 7 % — ABNORMAL HIGH (ref 4.8–5.6)

## 2019-03-16 LAB — TSH: TSH: 1.28 u[IU]/mL (ref 0.450–4.500)

## 2019-03-17 ENCOUNTER — Encounter: Payer: Self-pay | Admitting: Family Medicine

## 2019-04-05 ENCOUNTER — Ambulatory Visit (INDEPENDENT_AMBULATORY_CARE_PROVIDER_SITE_OTHER): Payer: Medicare HMO | Admitting: Family Medicine

## 2019-04-05 ENCOUNTER — Encounter: Payer: Self-pay | Admitting: Family Medicine

## 2019-04-05 ENCOUNTER — Other Ambulatory Visit: Payer: Self-pay

## 2019-04-05 ENCOUNTER — Telehealth: Payer: Self-pay | Admitting: *Deleted

## 2019-04-05 VITALS — BP 138/80 | Temp 97.0°F | Ht 64.0 in | Wt 238.0 lb

## 2019-04-05 DIAGNOSIS — N644 Mastodynia: Secondary | ICD-10-CM | POA: Diagnosis not present

## 2019-04-05 DIAGNOSIS — K5900 Constipation, unspecified: Secondary | ICD-10-CM

## 2019-04-05 DIAGNOSIS — R0609 Other forms of dyspnea: Secondary | ICD-10-CM

## 2019-04-05 DIAGNOSIS — R079 Chest pain, unspecified: Secondary | ICD-10-CM | POA: Diagnosis not present

## 2019-04-05 NOTE — Telephone Encounter (Signed)
Pt contacted and verbalized understanding.  

## 2019-04-05 NOTE — Telephone Encounter (Signed)
Pt seen today and wanted to know what she could take for breast pain. States she has been taking tylenol 2 500mg  and it sometimes help with pain and sometimes it does not.   [place]. Pt states to call her on cell [phone] if she does not answer home number.

## 2019-04-05 NOTE — Telephone Encounter (Signed)
She was seen earlier today- please tell her the following   although I am sympathetic to what is going on with her I would recommend cool compresses on also recommend just sticking with Tylenol there is too much risk of side effects by adding medications for this problem  We are certainly working toward trying to get this better by helping set her up with specialist for further evaluation  If all of this just becomes unbearable by next week call us back and I will consider adding another medicine

## 2019-04-05 NOTE — Progress Notes (Signed)
Subjective:    Patient ID: [name], female    DOB: [birth year], 73 y.o.   MRN: [id number]  HPI left breast pain going on for a few months.  She relates pain within the breast itself.  We have done a mammogram which is normal.  We did an exam previous and exam today it was normal.  Because of ongoing pain we will get her set up with a general surgeon for second opinion.  I recommend Tylenol for the pain   The patient also relates chest discomfort and pressure feeling in her chest on the left side radiates to the left side hurts when she goes up steps she states she broke out in a sweat and she felt chilled as well as short of breath lasted 5 to 10 minutes then got better after resting in a chair she has had previous work-up in 2017 which was normal.  I did consult with her cardiologist via EMR on previous visit but now patient having worsening symptoms pt states it is getting worse. Pt states this morning she was carrying some pillow up the steps and she started having fatigue, sob, sweating and chest pain on left side and this was different than the pain she has had in the past with her breast and she wonders if it is her heart.   Constipation. Pt states she has not had bm in 3 days. Tried multiple meds over the counter and no relief.  She denies any rectal bleeding issues.  Review of Systems  Constitutional: Negative for activity change, appetite change and fatigue.  HENT: Negative for congestion and rhinorrhea.   Respiratory: Negative for cough and shortness of breath.   Cardiovascular: Negative for chest pain and leg swelling.  Gastrointestinal: Positive for constipation. Negative for abdominal pain, blood in stool and diarrhea.  Endocrine: Negative for polydipsia and polyphagia.  Skin: Negative for color change.  Neurological: Negative for dizziness and weakness.  Psychiatric/Behavioral: Negative for behavioral problems and confusion.       Objective:   Physical Exam  Vitals signs reviewed.  Constitutional:      General: She is not in acute distress. HENT:     Head: Normocephalic and atraumatic.  Eyes:     General:        Right eye: No discharge.        Left eye: No discharge.  Neck:     Trachea: No tracheal deviation.  Cardiovascular:     Rate and Rhythm: Normal rate and regular rhythm.     Heart sounds: Normal heart sounds. No murmur.  Pulmonary:     Effort: Pulmonary effort is normal. No respiratory distress.     Breath sounds: Normal breath sounds.  Lymphadenopathy:     Cervical: No cervical adenopathy.  Skin:    General: Skin is warm and dry.  Neurological:     Mental Status: She is alert.     Coordination: Coordination normal.  Psychiatric:        Behavior: Behavior normal.   Breast exam normal EKG normal no acute changes with compared to the previous EKG from 11/14/2018 there is no acute changes        Assessment & Plan:  DOE along with chest discomfort- although the likelihood of a severe coronary artery blockage is low given her 2017 work-up at the same time this patient is sedentary has risk factors and she is having progressive problems with the chest pain along with DOE I believe we have  no other choice but to proceed forward with further evaluation.  I will send her back to cardiology for their evaluation.  And we certainly appreciate their help  As for her left breast pain it is concerning.  Mammogram is reassuring breast exam is reassuring to make sure we are not overlooking anything we will get her set up with general surgery for evaluation.  Significant constipation issues.  Denies any rectal bleeding.  We will do stool test for blood she is not due for colonoscopy till next year.  Ideally we do not want to do the colonoscopy currently.  I recommend using MiraLAX twice daily until results unfortunately the patient states the medication that GI prescribed Linzess is too expensive I doubt other medicines will be less expensive   25 minutes was spent with the patient.  This statement verifies that 25 minutes was indeed spent with the patient.  More than 50% of this visit-total duration of the visit-was spent in counseling and coordination of care. The issues that the patient came in for today as reflected in the diagnosis (s) please refer to documentation for further details.

## 2019-04-06 ENCOUNTER — Other Ambulatory Visit: Payer: Self-pay | Admitting: Family Medicine

## 2019-04-06 NOTE — Progress Notes (Signed)
Discussed with pt and mailed stool test to pt.

## 2019-04-25 NOTE — Progress Notes (Signed)
Cardiology Office Note    Date:  04/30/2019   ID:  [name], DOB October 19, 1945, MRN [id number]  PCP:  Kathleen Drown, MD  Cardiologist: No primary care provider on file. EPS: None  No chief complaint on file.   History of Present Illness:  [name] is a 73 y.o. female of chest pain no significant disease on cardiac catheterization in 2005 and 2012.  NST 07/2016 without ischemia.  She was asked by GI not to take aspirin in the past.  Saw Dr. Harl Bowie 11/10/2018 complaining of chest pain when laying down with heaviness on the left side down her arm associated with diaphoresis.  It would come and go and last 20 to 30 minutes.  She was tender to palpation.  Also under a lot of stress at that time.  Chest pain was felt to be atypical and no further work-up recommended.  Patient is referred back by [name] because of recurrent chest pain.  Patient says her left breast hurts all the time. When she takes her bra off it feels like it's pulling. Then her arm starts hurting. Sometimes hurts so bad she gets nauseated. Tylenol helps the pain-taking 2-3 500 mg twice a day. Same pain she's been having for years but seems to be getting worse.Doesn't think she's strained herself but lost her husband and does all the housework herself. Under a lot of stress with her husband passing 05/2018 and son on dialysis. Having to take care of everything and she's tired.Short of breath with little activity.Was walking 1/4 mile but was told by [name] to hold off until seen by [place].    Past Medical History:  Diagnosis Date  . Blood clot of artery under arm ([place])   . Cyst on liver  . Depression   . Diabetes mellitus    controlled by diet  . Diverticulitis   . DVT of axillary vein, acute right ([place]) 11/24/2012   Dx on Jan 28, 2012.    . Fibrocystic breast disease   . Hepatic cyst   . Hypertension   . IBS (irritable bowel syndrome)   . Impaired glucose tolerance   . Menopause      Past Surgical History:  Procedure Laterality Date  . ABDOMINAL HYSTERECTOMY    . BACK SURGERY    . BREAST SURGERY  right   cyst removed  . CAST APPLICATION  [id number]   Procedure: CAST APPLICATION;  Surgeon: Carole Civil, MD;  Location: [facility];  Service: Orthopedics;  Laterality: Right;  procedure room   . CHOLECYSTECTOMY    . COLONOSCOPY  12/04/2004   NUR:Few small diverticula at sigmoid colon/Small cecal polyp ablated   . COLONOSCOPY N/A 09/24/2014   SLF: small internal hemorrhoids  . ESOPHAGOGASTRODUODENOSCOPY N/A 02/02/2016   Procedure: ESOPHAGOGASTRODUODENOSCOPY (EGD);  Surgeon: Danie Binder, MD;  Location: [facility] SUITE;  Service: Endoscopy;  Laterality: N/A;  930   . KNEE ARTHROCENTESIS  left  . TUBAL LIGATION      Current Medications: Current Meds  Medication Sig  . acarbose (PRECOSE) 25 MG tablet Take 25 mg by mouth daily.   [name] acetaminophen (TYLENOL) 500 MG tablet Take 1,000 mg by mouth every 6 (six) hours as needed for mild pain, moderate pain or headache.   . Alcohol Swabs (B-D SINGLE USE SWABS REGULAR) PADS USE EVERY DAY  . Ca Phosphate-Cholecalciferol (CALCIUM/VITAMIN D3 GUMMIES) 250-350 MG-UNIT CHEW daily.   . Cholecalciferol (D-5000) 5000 units TABS Take 1 tablet by  mouth daily.   . clopidogrel (PLAVIX) 75 MG tablet Take 1 tablet by mouth once daily  . dicyclomine (BENTYL) 10 MG capsule Take 1 capsule (10 mg total) by mouth 3 (three) times daily as needed for spasms (with/before meals).  . hydrochlorothiazide (HYDRODIURIL) 25 MG tablet TAKE 1 TABLET EVERY DAY  . losartan (COZAAR) 50 MG tablet TAKE 1 TABLET EVERY DAY  . Multiple Vitamins-Minerals (MULTIVITAMIN WITH MINERALS) tablet Take 1 tablet by mouth daily.  . pantoprazole (PROTONIX) 40 MG tablet TAKE 1 TABLET EVERY DAY  . pravastatin (PRAVACHOL) 20 MG tablet TAKE 1 TABLET AT BEDTIME  . TRUEPLUS LANCETS 33G MISC TEST EVERY DAY     Allergies:   Norvasc [amlodipine besylate], Penicillins, Advair diskus  [fluticasone-salmeterol], and Metformin and related   Social History   Socioeconomic History  . Marital status: Married    Spouse name: Not on file  . Number of children: Not on file  . Years of education: Not on file  . Highest education level: Not on file  Occupational History  . Occupation: Retired  Scientific laboratory technician  . Financial resource strain: Not on file  . Food insecurity    Worry: Not on file    Inability: Not on file  . Transportation needs    Medical: Not on file    Non-medical: Not on file  Tobacco Use  . Smoking status: Never Smoker  . Smokeless tobacco: Never Used  . Tobacco comment: Never smoked  Substance and Sexual Activity  . Alcohol use: No    Alcohol/week: 0.0 standard drinks  . Drug use: No  . Sexual activity: Yes  Lifestyle  . Physical activity    Days per week: Not on file    Minutes per session: Not on file  . Stress: Not on file  Relationships  . Social Herbalist on phone: Not on file    Gets together: Not on file    Attends religious service: Not on file    Active member of club or organization: Not on file    Attends meetings of clubs or organizations: Not on file    Relationship status: Not on file  Other Topics Concern  . Not on file  Social History Narrative  . Not on file     Family History:  The patient's family history includes Cancer in her sister; Diabetes in her brother and sister; Heart attack in her mother; Heart disease in her brother; Kidney disease in her son; Seizures in her brother; Stroke in her mother.   ROS:   Please see the history of present illness.    ROS All other systems reviewed and are negative.   PHYSICAL EXAM:   VS:  BP (!) 177/71   Pulse (!) 52   Temp (!) 97.5 F (36.4 C) (Temporal)   Ht 5\' 4"  (1.626 m)   Wt 238 lb (108 kg)   SpO2 98%   BMI 40.85 kg/m   Physical Exam  GEN: Obese, in no acute distress  Neck: no JVD, carotid bruits, or masses Cardiac:left chest tender to  palpation-reproduces pain.RRR; no murmurs, rubs, or gallops  Respiratory:  clear to auscultation bilaterally, normal work of breathing GI: soft, nontender, nondistended, + BS Ext: without cyanosis, clubbing, or edema, Good distal pulses bilaterally Neuro:  Alert and Oriented x 3 are intact Psych: euthymic mood, full affect  Wt Readings from Last 3 Encounters:  04/30/19 238 lb (108 kg)  04/05/19 238 lb (108 kg)  11/10/18  234 lb (106.1 kg)      Studies/Labs Reviewed:   EKG:  EKG is not ordered today.   Recent Labs: 03/15/2019: ALT 18; BUN 19; Creatinine, Ser 0.83; Hemoglobin 13.1; Platelets 308; Potassium 4.4; Sodium 140; TSH 1.280   Lipid Panel    Component Value Date/Time   CHOL 139 03/15/2019 0941   TRIG 70 03/15/2019 0941   HDL 51 03/15/2019 0941   CHOLHDL 2.7 03/15/2019 0941   CHOLHDL 3.4 09/26/2014 1019   VLDL 17 09/26/2014 1019   LDLCALC 74 03/15/2019 0941    Additional studies/ records that were reviewed today include:     07/2016 Nuclear stress test  There was no ST segment deviation noted during stress.  The study is normal. No ischemia or infarction.  This is a low risk study.  Nuclear stress EF: 75%.     05/2011 cath ANGIOGRAPHIC FINDINGS: 1. The left main coronary artery was a short segment and had no     disease. 2. The left anterior descending was a large vessel that coursed to the     apex and gave off a moderate-sized diagonal branch.  The midportion     of the left anterior descending artery had a mild 20% stenosis.     The diagonal branch was free of any disease. 3. Circumflex artery is a moderate-sized vessel that gives off a     moderate-sized bifurcating obtuse marginal branch.  This vessel is     free of any disease. 4. The right coronary artery is a moderate-sized dominant vessel with     no evidence of disease. 5. Left ventricular angiogram was performed in the RAO projection and     it showed normal left ventricular systolic function  with ejection     fraction of 65-70%.   IMPRESSION: 1. Mild nonobstructive coronary artery disease. 2. Normal left ventricular systolic function. 3. Noncardiac chest pain.     06/2004 cath Left main coronary artery was normal.    The left anterior descending artery was normal.    Circumflex coronary artery is normal.    Right coronary artery was somewhat small but dominant.  It was normal.    Right ventriculography:  The right ventriculography showed hyperdynamic LV  function, ejection fraction of 65%.  There was no gradient across the aortic  valve and no MR.  The aortic pressure was 129/66, LV pressure is 130/90.    IMPRESSION:  The patient has no evidence of significant coronary artery  disease on RAO ventriculogram.  The ascending aorta and descending thoracic  aorta looked fine with no evidence of dissection.  Her chest pain would  appear to be noncardiac in etiology.  She will be discharged later today to  follow up with her primary care M.D.    The patient tolerated the procedure well.     ASSESSMENT:    1. Chest pain, unspecified type   2. Hyperlipidemia associated with type 2 diabetes mellitus ([place])   3. Essential hypertension      PLAN:  In order of problems listed above:  Chest pain chronic with nonobstructive disease on cardiac cath in 2005 and 2012.  Normal NST 2017. Pain atypical and tender to palpation. Patient also has dyspnea with little exertion. Can do lexiscan myoview to exclude ischemia but I suspect this is M-S in origin. F/u with Dr. Harl Bowie in 6 months or sooner if abnormal scan.  Essential hypertension BP high. Not eating properly.2 gm sodium diet. Increase losartan 100 mg  daily bmet in 2 weeks.   Hyperlipidemia followed by PCP   diabetes mellitus followed by PCP    Medication Adjustments/Labs and Tests Ordered: Current medicines are reviewed at length with the patient today.  Concerns regarding medicines are outlined above.  Medication  changes, Labs and Tests ordered today are listed in the Patient Instructions below. There are no Patient Instructions on file for this visit.   Signed, Ermalinda Barrios, PA-C  04/30/2019 1:38 PM    [facility] [address], [place], [place]  [address] Phone: [phone]; Fax: [phone]

## 2019-04-30 ENCOUNTER — Encounter: Payer: Self-pay | Admitting: Physician Assistant

## 2019-04-30 ENCOUNTER — Ambulatory Visit (INDEPENDENT_AMBULATORY_CARE_PROVIDER_SITE_OTHER): Payer: Medicare HMO | Admitting: Physician Assistant

## 2019-04-30 ENCOUNTER — Encounter: Payer: Self-pay | Admitting: *Deleted

## 2019-04-30 ENCOUNTER — Other Ambulatory Visit: Payer: Self-pay

## 2019-04-30 ENCOUNTER — Telehealth: Payer: Self-pay | Admitting: Family Medicine

## 2019-04-30 VITALS — BP 177/71 | HR 52 | Temp 97.5°F | Ht 64.0 in | Wt 238.0 lb

## 2019-04-30 DIAGNOSIS — R079 Chest pain, unspecified: Secondary | ICD-10-CM

## 2019-04-30 DIAGNOSIS — I10 Essential (primary) hypertension: Secondary | ICD-10-CM | POA: Diagnosis not present

## 2019-04-30 DIAGNOSIS — E785 Hyperlipidemia, unspecified: Secondary | ICD-10-CM | POA: Diagnosis not present

## 2019-04-30 DIAGNOSIS — E1169 Type 2 diabetes mellitus with other specified complication: Secondary | ICD-10-CM | POA: Diagnosis not present

## 2019-04-30 MED ORDER — LOSARTAN POTASSIUM 100 MG PO TABS: 100.0000 mg | ORAL_TABLET | Freq: Every day | ORAL | 3 refills | Status: DC

## 2019-04-30 NOTE — Telephone Encounter (Signed)
Please let patient know that I was able to read over the note from the specialist I agree with increasing the dose of the medicine but I would prefer for the patient not only to go with the new dose but also do a follow-up visit with [place] within the next 2 to 3 weeks to recheck blood pressure we can do that here in the office-reassured her that we have a low flow of patients in the office and we are keeping sick people out of the office  Office visit 2 to 3 weeks preferred

## 2019-04-30 NOTE — Telephone Encounter (Signed)
Patient seen her heart doctor today and her BP was 177/71 and her specialist wanted her to double up on her blood pressure medication.She wanted to check with you first. Please Advise

## 2019-04-30 NOTE — Telephone Encounter (Signed)
Patient stated she will start new dose of medication and scheduled follow up office visit with Dr Nicki Reaper in 2-3 weeks.

## 2019-04-30 NOTE — Addendum Note (Signed)
Addended by: Levonne Hubert on: 04/30/2019 01:47 PM   Modules accepted: Orders

## 2019-04-30 NOTE — Patient Instructions (Signed)
Medication Instructions:  Your physician has recommended you make the following change in your medication:  Increase Lorsartan to 100 mg Daily    Labwork: Your physician recommends that you return for lab work in: 1-2 Weeks    Testing/Procedures: Your physician has requested that you have a lexiscan myoview. For further information please visit [email or web address]. Please follow instruction sheet, as given.    Follow-Up: Your physician recommends that you schedule a follow-up appointment in: 6 Months with Dr. Harl Bowie    Any Other Special Instructions Will Be Listed Below (If Applicable).     If you need a refill on your cardiac medications before your next appointment, please call your pharmacy. Thank you for choosing [facility]!    Heart-Healthy Eating Plan Many factors influence your heart (coronary) health, including eating and exercise habits. Coronary risk increases with abnormal blood fat (lipid) levels. Heart-healthy meal planning includes limiting unhealthy fats, increasing healthy fats, and making other diet and lifestyle changes. What is my plan? Your health care provider may recommend that you:  Limit your fat intake to _________% or less of your total calories each day.  Limit your saturated fat intake to _________% or less of your total calories each day.  Limit the amount of cholesterol in your diet to less than _________ mg per day. What are tips for following this plan? Cooking Cook foods using methods other than frying. Baking, boiling, grilling, and broiling are all good options. Other ways to reduce fat include:  Removing the skin from poultry.  Removing all visible fats from meats.  Steaming vegetables in water or broth. Meal planning   At meals, imagine dividing your plate into fourths: ? Fill one-half of your plate with vegetables and green salads. ? Fill one-fourth of your plate with whole grains. ? Fill one-fourth of your plate  with lean protein foods.  Eat 4-5 servings of vegetables per day. One serving equals 1 cup raw or cooked vegetable, or 2 cups raw leafy greens.  Eat 4-5 servings of fruit per day. One serving equals 1 medium whole fruit,  cup dried fruit,  cup fresh, frozen, or canned fruit, or  cup 100% fruit juice.  Eat more foods that contain soluble fiber. Examples include apples, broccoli, carrots, beans, peas, and barley. Aim to get 25-30 g of fiber per day.  Increase your consumption of legumes, nuts, and seeds to 4-5 servings per week. One serving of dried beans or legumes equals  cup cooked, 1 serving of nuts is  cup, and 1 serving of seeds equals 1 tablespoon. Fats  Choose healthy fats more often. Choose monounsaturated and polyunsaturated fats, such as olive and canola oils, flaxseeds, walnuts, almonds, and seeds.  Eat more omega-3 fats. Choose salmon, mackerel, sardines, tuna, flaxseed oil, and ground flaxseeds. Aim to eat fish at least 2 times each week.  Check food labels carefully to identify foods with trans fats or high amounts of saturated fat.  Limit saturated fats. These are found in animal products, such as meats, butter, and cream. Plant sources of saturated fats include palm oil, palm kernel oil, and coconut oil.  Avoid foods with partially hydrogenated oils in them. These contain trans fats. Examples are stick margarine, some tub margarines, cookies, crackers, and other baked goods.  Avoid fried foods. General information  Eat more home-cooked food and less restaurant, buffet, and fast food.  Limit or avoid alcohol.  Limit foods that are high in starch and sugar.  Lose weight if you  are overweight. Losing just 5-10% of your body weight can help your overall health and prevent diseases such as diabetes and heart disease.  Monitor your salt (sodium) intake, especially if you have high blood pressure. Talk with your health care provider about your sodium intake.  Try to  incorporate more vegetarian meals weekly. What foods can I eat? Fruits All fresh, canned (in natural juice), or frozen fruits. Vegetables Fresh or frozen vegetables (raw, steamed, roasted, or grilled). Green salads. Grains Most grains. Choose whole wheat and whole grains most of the time. Rice and pasta, including brown rice and pastas made with whole wheat. Meats and other proteins Lean, well-trimmed beef, veal, pork, and lamb. Chicken and [place] without skin. All fish and shellfish. Wild duck, rabbit, pheasant, and venison. Egg whites or low-cholesterol egg substitutes. Dried beans, peas, lentils, and tofu. Seeds and most nuts. Dairy Low-fat or nonfat cheeses, including ricotta and mozzarella. Skim or 1% milk (liquid, powdered, or evaporated). Buttermilk made with low-fat milk. Nonfat or low-fat yogurt. Fats and oils Non-hydrogenated (trans-free) margarines. Vegetable oils, including soybean, sesame, sunflower, olive, peanut, safflower, corn, canola, and cottonseed. Salad dressings or mayonnaise made with a vegetable oil. Beverages Water (mineral or sparkling). Coffee and tea. Diet carbonated beverages. Sweets and desserts Sherbet, gelatin, and fruit ice. Small amounts of dark chocolate. Limit all sweets and desserts. Seasonings and condiments All seasonings and condiments. The items listed above may not be a complete list of foods and beverages you can eat. Contact a dietitian for more options. What foods are not recommended? Fruits Canned fruit in heavy syrup. Fruit in cream or butter sauce. Fried fruit. Limit coconut. Vegetables Vegetables cooked in cheese, cream, or butter sauce. Fried vegetables. Grains Breads made with saturated or trans fats, oils, or whole milk. Croissants. Sweet rolls. Donuts. High-fat crackers, such as cheese crackers. Meats and other proteins Fatty meats, such as hot dogs, ribs, sausage, bacon, rib-eye roast or steak. High-fat deli meats, such as salami and  bologna. Caviar. Domestic duck and goose. Organ meats, such as liver. Dairy Cream, sour cream, cream cheese, and creamed cottage cheese. Whole milk cheeses. Whole or 2% milk (liquid, evaporated, or condensed). Whole buttermilk. Cream sauce or high-fat cheese sauce. Whole-milk yogurt. Fats and oils Meat fat, or shortening. Cocoa butter, hydrogenated oils, palm oil, coconut oil, palm kernel oil. Solid fats and shortenings, including bacon fat, salt pork, lard, and butter. Nondairy cream substitutes. Salad dressings with cheese or sour cream. Beverages Regular sodas and any drinks with added sugar. Sweets and desserts Frosting. Pudding. Cookies. Cakes. Pies. Milk chocolate or white chocolate. Buttered syrups. Full-fat ice cream or ice cream drinks. The items listed above may not be a complete list of foods and beverages to avoid. Contact a dietitian for more information. Summary  Heart-healthy meal planning includes limiting unhealthy fats, increasing healthy fats, and making other diet and lifestyle changes.  Lose weight if you are overweight. Losing just 5-10% of your body weight can help your overall health and prevent diseases such as diabetes and heart disease.  Focus on eating a balance of foods, including fruits and vegetables, low-fat or nonfat dairy, lean protein, nuts and legumes, whole grains, and heart-healthy oils and fats. This information is not intended to replace advice given to you by your health care provider. Make sure you discuss any questions you have with your health care provider. Document Released: 05/25/2008 Document Revised: 09/23/2017 Document Reviewed: 09/23/2017 Elsevier Patient Education  2020 [employer].

## 2019-05-03 ENCOUNTER — Encounter (HOSPITAL_COMMUNITY): Admission: RE | Admit: 2019-05-03 | Payer: Medicare HMO | Source: Ambulatory Visit

## 2019-05-03 ENCOUNTER — Encounter (HOSPITAL_COMMUNITY): Payer: Medicare HMO

## 2019-05-03 ENCOUNTER — Encounter: Payer: Self-pay | Admitting: Family Medicine

## 2019-05-10 ENCOUNTER — Ambulatory Visit (INDEPENDENT_AMBULATORY_CARE_PROVIDER_SITE_OTHER): Payer: Medicare HMO | Admitting: General Surgery

## 2019-05-10 ENCOUNTER — Encounter: Payer: Self-pay | Admitting: General Surgery

## 2019-05-10 ENCOUNTER — Other Ambulatory Visit: Payer: Self-pay

## 2019-05-10 ENCOUNTER — Other Ambulatory Visit: Payer: Self-pay | Admitting: Cardiology

## 2019-05-10 VITALS — BP 152/75 | HR 60 | Temp 97.3°F | Resp 16 | Ht 64.0 in | Wt 236.0 lb

## 2019-05-10 DIAGNOSIS — N644 Mastodynia: Secondary | ICD-10-CM

## 2019-05-10 DIAGNOSIS — I10 Essential (primary) hypertension: Secondary | ICD-10-CM | POA: Diagnosis not present

## 2019-05-10 NOTE — Patient Instructions (Signed)

## 2019-05-10 NOTE — Progress Notes (Signed)
[name]; [id number]; 20-Feb-1946   HPI Patient is a 73 year old black female who was referred to my care by [name] for evaluation treatment of left breast pain.  Patient has had left breast pain for over 9 months.  She denies any trauma to the left breast.  She does not feel any left breast mass.  No nipple discharge has been noted.  She does have a history of fibrocystic disease that was diagnosed in the right breast by biopsy many years ago.  She has a sister who was diagnosed with breast cancer, but is still alive at the age of 32.  She currently has a pain level of 4 out of 10.  She is not able to take NSAIDs due to her cardiac history.  She does not wear underwire bras.  She has tried to change the type of bra she wears in order to provide more full support to her breasts.  Mammography done recently was negative for malignancy. Past Medical History:  Diagnosis Date  . Blood clot of artery under arm ([place])   . Cyst on liver  . Depression   . Diabetes mellitus    controlled by diet  . Diverticulitis   . DVT of axillary vein, acute right ([place]) 11/24/2012   Dx on Jan 28, 2012.    . Fibrocystic breast disease   . Hepatic cyst   . Hypertension   . IBS (irritable bowel syndrome)   . Impaired glucose tolerance   . Menopause     Past Surgical History:  Procedure Laterality Date  . ABDOMINAL HYSTERECTOMY    . BACK SURGERY    . BREAST SURGERY  right   cyst removed  . CAST APPLICATION  [id number]   Procedure: CAST APPLICATION;  Surgeon: Carole Civil, MD;  Location: [facility] ORS;  Service: Orthopedics;  Laterality: Right;  procedure room   . CHOLECYSTECTOMY    . COLONOSCOPY  12/04/2004   NUR:Few small diverticula at sigmoid colon/Small cecal polyp ablated   . COLONOSCOPY N/A 09/24/2014   SLF: small internal hemorrhoids  . ESOPHAGOGASTRODUODENOSCOPY N/A 02/02/2016   Procedure: ESOPHAGOGASTRODUODENOSCOPY (EGD);  Surgeon: Danie Binder, MD;  Location: [facility] END[facility] SUITE;   Service: Endoscopy;  Laterality: N/A;  930   . KNEE ARTHROCENTESIS  left  . TUBAL LIGATION      Family History  Problem Relation Age of Onset  . Stroke Mother   . Heart attack Mother   . Cancer Sister   . Diabetes Sister   . Seizures Brother   . Diabetes Brother   . Heart disease Brother   . Kidney disease Son   . Colon cancer Neg Hx     Current Outpatient Medications on File Prior to Visit  Medication Sig Dispense Refill  . acarbose (PRECOSE) 25 MG tablet Take 25 mg by mouth daily.     [name] acetaminophen (TYLENOL) 500 MG tablet Take 1,000 mg by mouth every 6 (six) hours as needed for mild pain, moderate pain or headache.     . Alcohol Swabs (B-D SINGLE USE SWABS REGULAR) PADS USE EVERY DAY 100 each 5  . Cholecalciferol (D-5000) 5000 units TABS Take 1 tablet by mouth daily.     . clopidogrel (PLAVIX) 75 MG tablet Take 1 tablet by mouth once daily 90 tablet 0  . dicyclomine (BENTYL) 10 MG capsule Take 1 capsule (10 mg total) by mouth 3 (three) times daily as needed for spasms (with/before meals). 30 capsule 3  . hydrochlorothiazide (  HYDRODIURIL) 25 MG tablet TAKE 1 TABLET EVERY DAY 90 tablet 1  . losartan (COZAAR) 100 MG tablet Take 1 tablet (100 mg total) by mouth daily. 90 tablet 3  . Multiple Vitamins-Minerals (MULTIVITAMIN WITH MINERALS) tablet Take 1 tablet by mouth daily.    . pantoprazole (PROTONIX) 40 MG tablet TAKE 1 TABLET EVERY DAY 90 tablet 1  . pravastatin (PRAVACHOL) 20 MG tablet TAKE 1 TABLET AT BEDTIME 90 tablet 1  . TRUEPLUS LANCETS 33G MISC TEST EVERY DAY 100 each 5  . Ca Phosphate-Cholecalciferol (CALCIUM/VITAMIN D3 GUMMIES) 250-350 MG-UNIT CHEW daily.      No current facility-administered medications on file prior to visit.     Allergies  Allergen Reactions  . Norvasc [Amlodipine Besylate] Other (See Comments)    Reaction:  Fatigue   . Penicillins Hives and Other (See Comments)    Has patient had a PCN reaction causing immediate rash, facial/tongue/throat  swelling, SOB or lightheadedness with hypotension: Yes Has patient had a PCN reaction causing severe rash involving mucus membranes or skin necrosis: No Has patient had a PCN reaction that required hospitalization No Has patient had a PCN reaction occurring within the last 10 years: No If all of the above answers are "NO", then may proceed with Cephalosporin use.   . Advair Diskus [Fluticasone-Salmeterol] Palpitations  . Metformin And Related Other (See Comments)    Reaction:  GI upset     Social History   Substance and Sexual Activity  Alcohol Use No  . Alcohol/week: 0.0 standard drinks    Social History   Tobacco Use  Smoking Status Never Smoker  Smokeless Tobacco Never Used  Tobacco Comment   Never smoked    Review of Systems  Constitutional: Positive for malaise/fatigue.  HENT: Negative.   Eyes: Negative.   Respiratory: Negative.   Cardiovascular: Negative.   Gastrointestinal: Positive for abdominal pain and heartburn.  Genitourinary: Negative.   Musculoskeletal: Positive for back pain.  Skin: Negative.   Neurological: Positive for dizziness and weakness.  Endo/Heme/Allergies: Negative.   Psychiatric/Behavioral: Negative.     Objective   Vitals:   05/10/19 0927  BP: (!) 152/75  Pulse: 60  Resp: 16  Temp: (!) 97.3 F (36.3 C)  SpO2: 98%    Physical Exam Vitals signs reviewed.  Constitutional:      Appearance: Normal appearance. She is obese. She is not ill-appearing.  HENT:     Head: Normocephalic and atraumatic.  Cardiovascular:     Rate and Rhythm: Normal rate and regular rhythm.     Heart sounds: Normal heart sounds. No murmur. No friction rub. No gallop.   Pulmonary:     Effort: Pulmonary effort is normal. No respiratory distress.     Breath sounds: Normal breath sounds. No stridor. No wheezing, rhonchi or rales.  Skin:    General: Skin is warm and dry.  Neurological:     Mental Status: She is alert and oriented to person, place, and time.     Breast: No dominant mass, nipple discharge, or dimpling in either breast.  Axillas are negative for palpable nodes.  Mammogram report reviewed Assessment  Left breast pain, no evidence of malignancy or surgical treatable disease Plan   I did reassure the patient that there is no evidence at this point of breast cancer.  She seemed relieved to hear that.  I told her at this point that she may need to try vitamin E and/or primrose oil for anti-inflammatory effects.  She was also instructed  to decrease her caffeine intake.  Literature was given concerning breast pain.  I told her to return to my care should she do feel any lump or mass.  Follow-up as needed.

## 2019-05-11 LAB — BASIC METABOLIC PANEL
BUN/Creatinine Ratio: 23 (ref 12–28)
BUN: 20 mg/dL (ref 8–27)
CO2: 26 mmol/L (ref 20–29)
Calcium: 9.8 mg/dL (ref 8.7–10.3)
Chloride: 100 mmol/L (ref 96–106)
Creatinine, Ser: 0.88 mg/dL (ref 0.57–1.00)
GFR calc Af Amer: 76 mL/min/{1.73_m2} (ref >59)
GFR calc non Af Amer: 66 mL/min/{1.73_m2} (ref >59)
Glucose: 109 mg/dL — ABNORMAL HIGH (ref 65–99)
Potassium: 4.4 mmol/L (ref 3.5–5.2)
Sodium: 139 mmol/L (ref 134–144)

## 2019-05-14 ENCOUNTER — Ambulatory Visit (INDEPENDENT_AMBULATORY_CARE_PROVIDER_SITE_OTHER): Payer: Medicare HMO | Admitting: Family Medicine

## 2019-05-14 ENCOUNTER — Encounter: Payer: Self-pay | Admitting: Family Medicine

## 2019-05-14 ENCOUNTER — Other Ambulatory Visit: Payer: Self-pay

## 2019-05-14 VITALS — BP 122/70 | Temp 97.9°F | Ht 64.0 in | Wt 238.0 lb

## 2019-05-14 DIAGNOSIS — N6019 Diffuse cystic mastopathy of unspecified breast: Secondary | ICD-10-CM

## 2019-05-14 DIAGNOSIS — Z23 Encounter for immunization: Secondary | ICD-10-CM

## 2019-05-14 DIAGNOSIS — I10 Essential (primary) hypertension: Secondary | ICD-10-CM

## 2019-05-14 MED ORDER — LOSARTAN POTASSIUM 50 MG PO TABS: 50.0000 mg | ORAL_TABLET | Freq: Every day | ORAL | 1 refills | Status: DC

## 2019-05-14 NOTE — Progress Notes (Signed)
   Subjective:    Patient ID: [name], female    DOB: Mar 08, 1946, 73 y.o.   MRN: [id number]  Hypertension This is a chronic problem. Associated symptoms include chest pain. Pertinent negatives include no shortness of breath. Treatments tried: losartan, hctz.  She states she finds her self feeling weak when she stands up.  Woozy at times.  Denies chest tightness pressure pain shortness of breath.  Would like to get rx to get some bras from [facility]. Pt states she was told that this would help with breast pain.  She did see specialist they told her that there was no breast cancer she just has fibrocystic breast disease  Would like a flu vaccine today.   Review of Systems  Constitutional: Negative for activity change, appetite change and fatigue.  HENT: Negative for congestion and rhinorrhea.   Respiratory: Negative for cough and shortness of breath.   Cardiovascular: Positive for chest pain. Negative for leg swelling.       She is having breast pain left side  Gastrointestinal: Negative for abdominal pain and diarrhea.  Endocrine: Negative for polydipsia and polyphagia.  Skin: Negative for color change.  Neurological: Negative for dizziness and weakness.  Psychiatric/Behavioral: Negative for behavioral problems and confusion.       Objective:   Physical Exam Vitals signs reviewed.  Constitutional:      General: She is not in acute distress. HENT:     Head: Normocephalic and atraumatic.  Eyes:     General:        Right eye: No discharge.        Left eye: No discharge.  Neck:     Trachea: No tracheal deviation.  Cardiovascular:     Rate and Rhythm: Normal rate and regular rhythm.     Heart sounds: Normal heart sounds. No murmur.  Pulmonary:     Effort: Pulmonary effort is normal. No respiratory distress.     Breath sounds: Normal breath sounds.  Lymphadenopathy:     Cervical: No cervical adenopathy.  Skin:    General: Skin is warm and dry.   Neurological:     Mental Status: She is alert.     Coordination: Coordination normal.  Psychiatric:        Behavior: Behavior normal.    Blood pressure rechecked sitting standing it does drop when she stands I recommend reducing the losartan 50 mg       Assessment & Plan:  Reduce the losartan to 50 mg recommend continuing everything else as is watch diet stay active follow-up in 6 weeks  Fibrocystic breast disease recommend prescription bras to help her

## 2019-05-18 ENCOUNTER — Ambulatory Visit (HOSPITAL_COMMUNITY)
Admission: RE | Admit: 2019-05-18 | Discharge: 2019-05-18 | Disposition: A | Discharge status: Home / Self Care | Payer: Medicare HMO | Source: Ambulatory Visit | Attending: Physician Assistant | Admitting: Physician Assistant

## 2019-05-18 ENCOUNTER — Other Ambulatory Visit: Payer: Self-pay

## 2019-05-18 ENCOUNTER — Encounter (HOSPITAL_COMMUNITY): Payer: Self-pay

## 2019-05-18 ENCOUNTER — Encounter (HOSPITAL_COMMUNITY)
Admission: RE | Admit: 2019-05-18 | Discharge: 2019-05-18 | Disposition: A | Discharge status: Home / Self Care | Payer: Medicare HMO | Source: Ambulatory Visit | Attending: Physician Assistant | Admitting: Physician Assistant

## 2019-05-18 ENCOUNTER — Telehealth: Payer: Self-pay

## 2019-05-18 DIAGNOSIS — R079 Chest pain, unspecified: Secondary | ICD-10-CM | POA: Diagnosis not present

## 2019-05-18 LAB — NM MYOCAR MULTI W/SPECT W/WALL MOTION / EF
LV dias vol: 75 mL (ref 46–106)
LV sys vol: 20 mL
Peak HR: 91 {beats}/min
RATE: 0.44
Rest HR: 54 {beats}/min
SDS: 2
SRS: 5
SSS: 7
TID: 0.98

## 2019-05-18 MED ORDER — SODIUM CHLORIDE 0.9% FLUSH
INTRAVENOUS | Status: AC
  Administered 2019-05-18: 10 mL via INTRAVENOUS
  Filled 2019-05-18: qty 10

## 2019-05-18 MED ORDER — TECHNETIUM TC 99M TETROFOSMIN IV KIT
30.0000 | PACK | Freq: Once | INTRAVENOUS | Status: AC | PRN
  Administered 2019-05-18: 30.8 via INTRAVENOUS

## 2019-05-18 MED ORDER — TECHNETIUM TC 99M TETROFOSMIN IV KIT
10.0000 | PACK | Freq: Once | INTRAVENOUS | Status: AC | PRN
  Administered 2019-05-18: 10.1 via INTRAVENOUS

## 2019-05-18 MED ORDER — REGADENOSON 0.4 MG/5ML IV SOLN
INTRAVENOUS | Status: AC
  Administered 2019-05-18: 0.4 mg via INTRAVENOUS
  Filled 2019-05-18: qty 5

## 2019-05-18 NOTE — Telephone Encounter (Signed)
-----   Message from Arnoldo Lenis, MD sent at 05/17/2019  3:50 PM EDT ----- Normal labs   Zandra Abts MD

## 2019-05-18 NOTE — Telephone Encounter (Signed)
Called pt. No answer. Left detailed message for pt.

## 2019-06-18 ENCOUNTER — Other Ambulatory Visit: Payer: Self-pay

## 2019-06-18 ENCOUNTER — Ambulatory Visit (INDEPENDENT_AMBULATORY_CARE_PROVIDER_SITE_OTHER): Payer: Medicare HMO | Admitting: Family Medicine

## 2019-06-18 ENCOUNTER — Encounter: Payer: Self-pay | Admitting: Family Medicine

## 2019-06-18 VITALS — BP 136/88 | Temp 96.1°F | Wt 237.4 lb

## 2019-06-18 DIAGNOSIS — R5383 Other fatigue: Secondary | ICD-10-CM | POA: Diagnosis not present

## 2019-06-18 DIAGNOSIS — E785 Hyperlipidemia, unspecified: Secondary | ICD-10-CM

## 2019-06-18 DIAGNOSIS — R519 Headache, unspecified: Secondary | ICD-10-CM

## 2019-06-18 DIAGNOSIS — I10 Essential (primary) hypertension: Secondary | ICD-10-CM | POA: Diagnosis not present

## 2019-06-18 DIAGNOSIS — E1140 Type 2 diabetes mellitus with diabetic neuropathy, unspecified: Secondary | ICD-10-CM

## 2019-06-18 DIAGNOSIS — E1169 Type 2 diabetes mellitus with other specified complication: Secondary | ICD-10-CM

## 2019-06-18 MED ORDER — LOSARTAN POTASSIUM 100 MG PO TABS: ORAL_TABLET | ORAL | 1 refills | Status: DC

## 2019-06-18 MED ORDER — BLOOD GLUCOSE METER KIT: PACK | 0 refills | Status: DC

## 2019-06-18 NOTE — Progress Notes (Signed)
Subjective:    Patient ID: [name], female    DOB: 1945-11-14, 73 y.o.   MRN: [id number]  HPI Pt here today for follow up. Pt states that she is still having headaches. Pt states her blood pressure has been high at times. Pt BP med was decreased at last visit in September (Losartan 50 mg). Pt states she also has no energy.   Pt does not have glucose machine at this time so she has not been able to check sugars.   Patient for blood pressure check up.  The patient does have hypertension.  The patient is on medication.  Patient relates compliance with meds. Todays BP reviewed with the patient. Patient denies issues with medication. Patient relates reasonable diet. Patient tries to minimize salt. Patient aware of BP goals. She states she is doing well with taking blood pressure medicine but is having frequent headaches  Frequent headaches more so during the day happens couple times a week throbbing she also describes in the back of her head front of the head denies any headaches in the middle night no vomiting or diarrhea no fevers  The patient was seen today as part of a comprehensive diabetic check up.the patient does have diabetes.  The patient follows here on a regular basis.  The patient relates medication compliance. No significant side effects to the medications. Denies any low glucose spells. Relates compliance with diet to a reasonable level. Patient does do labwork intermittently and understands the dangers of diabetes.  She does not know what her sugars been doing because her glucometer has been broken  Review of Systems  Constitutional: Negative for activity change, appetite change and fatigue.  HENT: Negative for congestion and rhinorrhea.   Respiratory: Negative for cough and shortness of breath.   Cardiovascular: Negative for chest pain and leg swelling.  Gastrointestinal: Negative for abdominal pain and diarrhea.  Endocrine: Negative for polydipsia and polyphagia.   Skin: Negative for color change.  Neurological: Negative for dizziness and weakness.  Psychiatric/Behavioral: Negative for behavioral problems and confusion.       Objective:   Physical Exam Vitals signs reviewed.  Constitutional:      General: She is not in acute distress. HENT:     Head: Normocephalic and atraumatic.  Eyes:     General:        Right eye: No discharge.        Left eye: No discharge.  Neck:     Trachea: No tracheal deviation.  Cardiovascular:     Rate and Rhythm: Normal rate and regular rhythm.     Heart sounds: Normal heart sounds. No murmur.  Pulmonary:     Effort: Pulmonary effort is normal. No respiratory distress.     Breath sounds: Normal breath sounds.  Lymphadenopathy:     Cervical: No cervical adenopathy.  Skin:    General: Skin is warm and dry.  Neurological:     Mental Status: She is alert.     Coordination: Coordination normal.  Psychiatric:        Behavior: Behavior normal.           Assessment & Plan:  1. Frequent headaches Her frequent headaches could be related to a lot of stress she is under.  I do not find any pathologic signs going on.  I recommend Tylenol.  It is also possible her headaches could be related to elevated blood pressure  2. Essential hypertension Increase losartan to 100 mg blood pressure is higher  than what I would like to see.  This puts her at increased risk of stroke - Hemoglobin A1c - Lipid Profile  3. Type 2 diabetes mellitus with diabetic neuropathy, without long-term current use of insulin (HCC) Previous A1c's look good no we will recheck A1c she was given a prescription for new glucometer encouraged to watch diet and stay active - Hemoglobin A1c - Lipid Profile  4. Hyperlipidemia associated with type 2 diabetes mellitus (HCC) Previous cholesterols look good we will recheck labs.  Continue medication.  Get LDL below 70. - Hemoglobin A1c - Lipid Profile  5. Other fatigue Significant fatigue I think  is partly related to the stress of going through the loss of her husband a year ago as well as some depression symptoms she denies being depressed in addition to this there is concern for lack of exercise and lack of proper diet we did discuss the importance of improving both of these   Recheck the patient's blood pressure in 4 weeks  25 minutes was spent with the patient.  This statement verifies that 25 minutes was indeed spent with the patient.  More than 50% of this visit-total duration of the visit-was spent in counseling and coordination of care. The issues that the patient came in for today as reflected in the diagnosis (s) please refer to documentation for further details.

## 2019-06-19 ENCOUNTER — Other Ambulatory Visit: Payer: Self-pay | Admitting: *Deleted

## 2019-06-19 ENCOUNTER — Encounter: Payer: Self-pay | Admitting: Family Medicine

## 2019-06-19 ENCOUNTER — Telehealth: Payer: Self-pay | Admitting: Family Medicine

## 2019-06-19 LAB — LIPID PANEL
Chol/HDL Ratio: 2.7 ratio (ref 0.0–4.4)
Cholesterol, Total: 138 mg/dL (ref 100–199)
HDL: 51 mg/dL (ref >39)
LDL Chol Calc (NIH): 70 mg/dL (ref 0–99)
Triglycerides: 87 mg/dL (ref 0–149)
VLDL Cholesterol Cal: 17 mg/dL (ref 5–40)

## 2019-06-19 LAB — HEMOGLOBIN A1C
Est. average glucose Bld gHb Est-mCnc: 151 mg/dL
Hgb A1c MFr Bld: 6.9 % — ABNORMAL HIGH (ref 4.8–5.6)

## 2019-06-19 MED ORDER — LOSARTAN POTASSIUM 100 MG PO TABS: ORAL_TABLET | ORAL | 1 refills | Status: DC

## 2019-06-19 NOTE — Telephone Encounter (Signed)
Patient was seen yesterday and her blood pressure medication was increased and she took one yesterday and felt funny all day . She didn't take one today please advise

## 2019-06-19 NOTE — Telephone Encounter (Signed)
Was taking losartan 50mg  and states it was increased yesterday to 100mg . After taking 100mg  yesterday she felt dizzy the whole day. Had to lay in the bed all day, felt weak when she tried to get up. Did not take this morning and she feels better. Has a little headache but its not bad. No dizziness or weakness.   [facility] [place].

## 2019-06-19 NOTE — Telephone Encounter (Signed)
Nurses On her medication list please change the losartan to half a tablet daily Please inform the patient stick with a half a tablet daily Encouraged her to eat healthy Try to stay active Minimize salt intake We will recheck blood pressure at her follow-up in a couple weeks time

## 2019-06-19 NOTE — Telephone Encounter (Signed)
Patient called back and nurse was not available.  I read to her exactly what Dr. Nicki Reaper had wrote and she understood to take a half a tablet and will follow doctor's directions and she will f/u in a couple of weeks. No need to call back.

## 2019-06-19 NOTE — Telephone Encounter (Signed)
Med list updated

## 2019-07-04 ENCOUNTER — Other Ambulatory Visit: Payer: Self-pay

## 2019-07-04 ENCOUNTER — Ambulatory Visit (INDEPENDENT_AMBULATORY_CARE_PROVIDER_SITE_OTHER): Payer: Medicare HMO | Admitting: Family Medicine

## 2019-07-04 ENCOUNTER — Encounter: Payer: Self-pay | Admitting: Family Medicine

## 2019-07-04 VITALS — BP 130/82 | Temp 97.9°F | Wt 238.4 lb

## 2019-07-04 DIAGNOSIS — I10 Essential (primary) hypertension: Secondary | ICD-10-CM | POA: Diagnosis not present

## 2019-07-04 DIAGNOSIS — E1140 Type 2 diabetes mellitus with diabetic neuropathy, unspecified: Secondary | ICD-10-CM | POA: Diagnosis not present

## 2019-07-04 NOTE — Progress Notes (Signed)
   Subjective:    Patient ID: [name], female    DOB: 1946-03-02, 73 y.o.   MRN: [id number]  HPI  Patient arrives for a follow up on blood pressure. Patient states she is feeling better but still having fatigue. Patient with ongoing fatigue tiredness at times feeling a little bit rundown recently did her lab work cholesterol under good control with her medication in addition to this her A1c at 6.9 she is taking echovirus she states she could do a better job watching her diet did not tolerate Metformin.  PMH benign states she will get eye exam later this year Review of Systems  Constitutional: Negative for activity change, appetite change and fatigue.  HENT: Negative for congestion and rhinorrhea.   Respiratory: Negative for cough and shortness of breath.   Cardiovascular: Negative for chest pain and leg swelling.  Gastrointestinal: Negative for abdominal pain and diarrhea.  Endocrine: Negative for polydipsia and polyphagia.  Skin: Negative for color change.  Neurological: Negative for dizziness and weakness.  Psychiatric/Behavioral: Negative for behavioral problems and confusion.       Objective:   Physical Exam Vitals signs reviewed.  Constitutional:      General: She is not in acute distress. HENT:     Head: Normocephalic and atraumatic.  Eyes:     General:        Right eye: No discharge.        Left eye: No discharge.  Neck:     Trachea: No tracheal deviation.  Cardiovascular:     Rate and Rhythm: Normal rate and regular rhythm.     Heart sounds: Normal heart sounds. No murmur.  Pulmonary:     Effort: Pulmonary effort is normal. No respiratory distress.     Breath sounds: Normal breath sounds.  Lymphadenopathy:     Cervical: No cervical adenopathy.  Skin:    General: Skin is warm and dry.  Neurological:     Mental Status: She is alert.     Coordination: Coordination normal.  Psychiatric:        Behavior: Behavior normal.           Assessment &  Plan:  Blood pressure better controlled continue current measures Diabetes recent lab work looks good reviewed with patient continue current measures. Cholesterol under good control reviewed with patient Patient will get her glucometer fixed with her company that sent it to her Patient will follow up in the spring follow-up sooner problems

## 2019-07-06 ENCOUNTER — Ambulatory Visit: Payer: Medicare HMO | Admitting: Family Medicine

## 2019-07-10 ENCOUNTER — Other Ambulatory Visit: Payer: Self-pay

## 2019-07-10 DIAGNOSIS — Z20822 Contact with and (suspected) exposure to covid-19: Secondary | ICD-10-CM

## 2019-07-12 LAB — NOVEL CORONAVIRUS, NAA: SARS-CoV-2, NAA: NOT DETECTED

## 2019-07-13 ENCOUNTER — Telehealth: Payer: Self-pay | Admitting: General Practice

## 2019-07-13 NOTE — Telephone Encounter (Signed)
Negative COVID results given. Patient results "NOT Detected." Caller expressed understanding. ° °

## 2019-07-19 ENCOUNTER — Encounter: Payer: Self-pay | Admitting: Nurse Practitioner

## 2019-07-19 ENCOUNTER — Other Ambulatory Visit: Payer: Self-pay

## 2019-07-19 ENCOUNTER — Ambulatory Visit (INDEPENDENT_AMBULATORY_CARE_PROVIDER_SITE_OTHER): Payer: Medicare HMO | Admitting: Nurse Practitioner

## 2019-07-19 VITALS — BP 176/79 | HR 61 | Temp 96.6°F | Ht 64.0 in | Wt 237.6 lb

## 2019-07-19 DIAGNOSIS — K589 Irritable bowel syndrome without diarrhea: Secondary | ICD-10-CM | POA: Diagnosis not present

## 2019-07-19 DIAGNOSIS — R1084 Generalized abdominal pain: Secondary | ICD-10-CM | POA: Diagnosis not present

## 2019-07-19 DIAGNOSIS — K2100 Gastro-esophageal reflux disease with esophagitis, without bleeding: Secondary | ICD-10-CM

## 2019-07-19 MED ORDER — LINACLOTIDE 145 MCG PO CAPS: 145.0000 ug | ORAL_CAPSULE | Freq: Every day | ORAL | 3 refills | Status: DC

## 2019-07-19 NOTE — Progress Notes (Signed)
Referring Provider: [name], MD Primary Care Physician:  [name], MD Primary GI:  Dr. Oneida Alar  Chief Complaint  Patient presents with  . Abdominal Pain  . Constipation    HPI:   [name] is a 73 y.o. female who presents for follow-up on abdominal pain and diarrhea.  The patient was last seen in our office 01/15/2019 for the same as well as weight loss.  The patient's last visit was a virtual office visit due to COVID-19/coronavirus pandemic.  Chronic history of IBS, GERD.  EGD up-to-date recommended PPI due to chronic gastritis.  History of diverticulitis.  Constipation with trial and failure of multiple medications.  Linzess effective but cost $400 a month.  Colonoscopy up-to-date 2014/11/17 with polyps and recommended repeat in 5 years 11/18/2019).  Her husband passed away recently in 2018/07/17 and having flares of abdominal pain related to stress.  Had lost 15 pounds in several months due to decreased appetite with stress.  Dicyclomine was prescribed at some point which made her feel bad so she stopped taking it.  At her last visit he noted her diarrhea was "off-and-on" and about 1 or 2 times a week which Bentyl helps.  Intermittent lower abdominal pain but not taking Bentyl for her pain.  Has Glucerna but not using it much.  Good support system including a friend who is a Theme park manager.  No other overt GI symptoms.  GERD well managed on Protonix.  Recommended continue Bentyl up to 3 times a day as needed for abdominal pain or diarrhea, eat small frequent healthier snacks with proteins, fruits, vegetables, whole grain carbohydrates.  Use Glucerna for supplementation, follow-up in 6 months.  Today she states she's doing ok overall. She is having constipation, tried Linzess and it helped. She has Linzess 145 previously. Still with ongoing generalized lower abdominal pain which goes away with a bowel movement. Has intermittent nausea which improves after a bowel movement. GERD doing  well on Protonix. Denies vomiting, hematochezia, melena, fever, chills, unintentional weight loss. Denies URI or flu-like symptoms. Denies loss of sense of taste or smell. Denies chest pain, dyspnea, dizziness, lightheadedness, syncope, near syncope. Denies any other upper or lower GI symptoms.  Past Medical History:  Diagnosis Date  . Blood clot of artery under arm ([facility])   . Cyst on liver  . Depression   . Diabetes mellitus    controlled by diet  . Diverticulitis   . DVT of axillary vein, acute right ([place]) 11/24/2012   Dx on Jan 28, 2012.    . Fibrocystic breast disease   . Hepatic cyst   . Hypertension   . IBS (irritable bowel syndrome)   . Impaired glucose tolerance   . Menopause     Past Surgical History:  Procedure Laterality Date  . ABDOMINAL HYSTERECTOMY    . BACK SURGERY    . BREAST SURGERY  right   cyst removed  . CAST APPLICATION  11/10/9700   Procedure: CAST APPLICATION;  Surgeon: Carole Civil, MD;  Location: [facility];  Service: Orthopedics;  Laterality: Right;  procedure room   . CHOLECYSTECTOMY    . COLONOSCOPY  12/04/2004   NUR:Few small diverticula at sigmoid colon/Small cecal polyp ablated   . COLONOSCOPY N/A 09/24/2014   SLF: small internal hemorrhoids  . ESOPHAGOGASTRODUODENOSCOPY N/A 02/02/2016   Procedure: ESOPHAGOGASTRODUODENOSCOPY (EGD);  Surgeon: Danie Binder, MD;  Location: [facility] SUITE;  Service: Endoscopy;  Laterality: N/A;  930   .  KNEE ARTHROCENTESIS  left  . TUBAL LIGATION      Current Outpatient Medications  Medication Sig Dispense Refill  . acarbose (PRECOSE) 25 MG tablet Take 25 mg by mouth daily.     [name] acetaminophen (TYLENOL) 500 MG tablet Take 1,000 mg by mouth every 6 (six) hours as needed for mild pain, moderate pain or headache.     . Alcohol Swabs (B-D SINGLE USE SWABS REGULAR) PADS USE EVERY DAY 100 each 5  . blood glucose meter kit and supplies Dispense based on patient and insurance preference. Use to check sugar once daily.  E11.9 1 each 0  . clopidogrel (PLAVIX) 75 MG tablet Take 1 tablet by mouth once daily 90 tablet 0  . dicyclomine (BENTYL) 10 MG capsule Take 1 capsule (10 mg total) by mouth 3 (three) times daily as needed for spasms (with/before meals). 30 capsule 3  . hydrochlorothiazide (HYDRODIURIL) 25 MG tablet TAKE 1 TABLET EVERY DAY 90 tablet 1  . losartan (COZAAR) 100 MG tablet Take one half tablet daily 45 tablet 1  . Multiple Vitamins-Minerals (MULTIVITAMIN WITH MINERALS) tablet Take 1 tablet by mouth daily.    . pantoprazole (PROTONIX) 40 MG tablet TAKE 1 TABLET EVERY DAY 90 tablet 1  . pravastatin (PRAVACHOL) 20 MG tablet TAKE 1 TABLET AT BEDTIME 90 tablet 1  . TRUEPLUS LANCETS 33G MISC TEST EVERY DAY 100 each 5   No current facility-administered medications for this visit.     Allergies as of 07/19/2019 - Review Complete 07/19/2019  Allergen Reaction Noted  . Norvasc [amlodipine besylate] Other (See Comments) 11/21/2012  . Penicillins Hives and Other (See Comments)   . Advair diskus [fluticasone-salmeterol] Palpitations 11/21/2012  . Metformin and related Other (See Comments) 09/28/2013    Family History  Problem Relation Age of Onset  . Stroke Mother   . Heart attack Mother   . Cancer Sister   . Diabetes Sister   . Seizures Brother   . Diabetes Brother   . Heart disease Brother   . Kidney disease Son   . Colon cancer Neg Hx     Social History   Socioeconomic History  . Marital status: Widowed    Spouse name: Not on file  . Number of children: Not on file  . Years of education: Not on file  . Highest education level: Not on file  Occupational History  . Occupation: Retired  Scientific laboratory technician  . Financial resource strain: Not on file  . Food insecurity    Worry: Not on file    Inability: Not on file  . Transportation needs    Medical: Not on file    Non-medical: Not on file  Tobacco Use  . Smoking status: Never Smoker  . Smokeless tobacco: Never Used  . Tobacco comment:  Never smoked  Substance and Sexual Activity  . Alcohol use: No    Alcohol/week: 0.0 standard drinks  . Drug use: No  . Sexual activity: Yes  Lifestyle  . Physical activity    Days per week: Not on file    Minutes per session: Not on file  . Stress: Not on file  Relationships  . Social Herbalist on phone: Not on file    Gets together: Not on file    Attends religious service: Not on file    Active member of club or organization: Not on file    Attends meetings of clubs or organizations: Not on file    Relationship status:  Not on file  Other Topics Concern  . Not on file  Social History Narrative  . Not on file    Review of Systems: General: Negative for anorexia, weight loss, fever, chills, fatigue, weakness. ENT: Negative for hoarseness, difficulty swallowing. CV: Negative for chest pain, angina, palpitations, peripheral edema.  Respiratory: Negative for dyspnea at rest, cough, sputum, wheezing.  GI: See history of present illness. Endo: Negative for unusual weight change.  Heme: Negative for bruising or bleeding. Allergy: Negative for rash or hives.   Physical Exam: BP (!) 176/79   Pulse 61   Temp (!) 96.6 F (35.9 C) (Temporal)   Ht 5' 4"  (1.626 m)   Wt 237 lb 9.6 oz (107.8 kg)   BMI 40.78 kg/m  General:   Alert and oriented. Pleasant and cooperative. Well-nourished and well-developed.  Eyes:  Without icterus, sclera clear and conjunctiva pink.  Ears:  Normal auditory acuity. Cardiovascular:  S1, S2 present without murmurs appreciated. Extremities without clubbing or edema. Respiratory:  Clear to auscultation bilaterally. No wheezes, rales, or rhonchi. No distress.  Gastrointestinal:  +BS, soft, non-tender and non-distended. No HSM noted. No guarding or rebound. No masses appreciated.  Rectal:  Deferred  Musculoskalatal:  Symmetrical without gross deformities. Neurologic:  Alert and oriented x4;  grossly normal neurologically. Psych:  Alert and  cooperative. Normal mood and affect. Heme/Lymph/Immune: No excessive bruising noted.    07/19/2019 9:28 AM   Disclaimer: This note was dictated with voice recognition software. Similar sounding words can inadvertently be transcribed and may not be corrected upon review.

## 2019-07-19 NOTE — Patient Instructions (Addendum)
Your health issues we discussed today were:   Irritable bowel syndrome with constipation and abdominal pain: 1. I have sent a prescription for Linzess 145 mcg to your pharmacy.  Take this once a day on an empty stomach 2. Call us if you have any problems obtaining a prescription 3. Call us if you have any worsening or severe symptoms  GERD (reflux/heartburn): 1. Glad your symptoms are doing well on your medications 2. Continue to take your acid blocker Protonix (pantoprazole) once a day, every day. 3. Call us if you have any worsening or severe symptoms  Intermittent diarrhea: 1. If you do develop diarrhea, stop taking Linzess and take Bentyl 2. When you return to constipation you can stop the Bentyl and restart Linzess 3. I feel once your colon is sufficiently cleaned out you will likely be more constipation.  Her diarrhea is likely "overflow diarrhea" from significant constipation 4. Call us if you have any worsening or severe symptoms.  Overall I recommend:  1. Continue your other current medications 2. Return for follow-up in 6 months 3. Call us if you have any questions or concerns.   At [facility] we value your feedback. You may receive a survey about your visit today. Please share your experience as we strive to create trusting relationships with our patients to provide genuine, compassionate, quality care.  We appreciate your understanding and patience as we review any laboratory studies, imaging, and other diagnostic tests that are ordered as we care for you. Our office policy is 5 business days for review of these results, and any emergent or urgent results are addressed in a timely manner for your best interest. If you do not hear from our office in 1 week, please contact us.   We also encourage the use of MyChart, which contains your medical information for your review as well. If you are not enrolled in this feature, an access code is on this after visit  summary for your convenience. Thank you for allowing [place] to be involved in your care.  It was great to see you today!  I hope you have a Happy Thanksgiving!!     Because of recent events of COVID-19 ("Coronavirus"), follow CDC recommendations:  1. Wash your hand frequently 2. Avoid touching your face 3. Stay away from people who are sick 4. If you have symptoms such as fever, cough, shortness of breath then call your healthcare provider for further guidance 5. If you are sick, STAY AT HOME unless otherwise directed by your healthcare provider. 6. Follow directions from state and national officials regarding staying safe

## 2019-07-19 NOTE — Assessment & Plan Note (Signed)
IBS constipation versus IBS mixed type.  She does have on and off diarrhea for which she will take Bentyl.  Currently she is having more constipation.  Query diarrhea is possible overflow diarrhea in the setting of significant constipation.  She is tried over-the-counter medications which have not been effective.  At this point she tried some leftover samples of Linzess which worked well for her.  I will send in a prescription for Linzess 145 mcg once daily on an empty stomach.  If there is difficulty obtaining this we can work on prior [employer], tier exception, versus other options.  Follow-up in 6 months.

## 2019-07-19 NOTE — Progress Notes (Signed)
cc'ed to pcp °

## 2019-07-19 NOTE — Assessment & Plan Note (Signed)
GERD currently doing well on PPI.  Recommend she continue this and follow-up in 6 months.

## 2019-07-19 NOTE — Assessment & Plan Note (Signed)
The patient describes intermittent abdominal pain associated with constipation.  Her pain improves after she has a bowel movement.  She is also had some intermittent diarrhea.  Likely IBS related.  We will restart her on Linzess.  I will send a prescription to her pharmacy to see if it is covered.  Continue Bentyl as needed for diarrhea.  Follow-up in 6 months.

## 2019-07-21 ENCOUNTER — Other Ambulatory Visit: Payer: Self-pay | Admitting: Family Medicine

## 2019-08-04 NOTE — Progress Notes (Signed)
REVIEWED-NO ADDITIONAL RECOMMENDATIONS. 

## 2019-08-21 ENCOUNTER — Other Ambulatory Visit: Payer: Self-pay | Admitting: Family Medicine

## 2019-08-31 ENCOUNTER — Other Ambulatory Visit: Payer: Self-pay | Admitting: Family Medicine

## 2019-09-11 ENCOUNTER — Ambulatory Visit: Payer: Medicare HMO | Attending: Internal Medicine

## 2019-09-11 ENCOUNTER — Other Ambulatory Visit: Payer: Self-pay

## 2019-09-11 DIAGNOSIS — Z20822 Contact with and (suspected) exposure to covid-19: Secondary | ICD-10-CM | POA: Diagnosis not present

## 2019-09-13 LAB — NOVEL CORONAVIRUS, NAA: SARS-CoV-2, NAA: NOT DETECTED

## 2019-09-14 ENCOUNTER — Telehealth: Payer: Self-pay | Admitting: Family Medicine

## 2019-09-14 NOTE — Telephone Encounter (Signed)
By all means she should It would be an incredibly important thing for her to do (If she lives over in the [facility] she should connect with [facility] to see if they have availability otherwise her local health department or potentially [place] will be setting up vaccine clinics please give her a website information)

## 2019-09-14 NOTE — Telephone Encounter (Signed)
Patient notified and lives in [place] and will call [facility] to see if she can get an appointment. Patient states she does not drive in [place].

## 2019-09-14 NOTE — Telephone Encounter (Signed)
Should she take the vaccine?

## 2019-09-19 ENCOUNTER — Other Ambulatory Visit: Payer: Self-pay | Admitting: Family Medicine

## 2019-10-22 ENCOUNTER — Other Ambulatory Visit: Payer: Self-pay | Admitting: Family Medicine

## 2019-11-01 ENCOUNTER — Ambulatory Visit: Payer: Medicare HMO | Admitting: Family Medicine

## 2019-11-03 ENCOUNTER — Other Ambulatory Visit: Payer: Self-pay | Admitting: Family Medicine

## 2019-11-05 NOTE — Telephone Encounter (Signed)
Last seen 09/12/18

## 2019-11-09 ENCOUNTER — Ambulatory Visit (INDEPENDENT_AMBULATORY_CARE_PROVIDER_SITE_OTHER): Payer: Medicare HMO | Admitting: Family Medicine

## 2019-11-09 ENCOUNTER — Other Ambulatory Visit: Payer: Self-pay

## 2019-11-09 ENCOUNTER — Encounter: Payer: Self-pay | Admitting: Family Medicine

## 2019-11-09 VITALS — BP 140/86 | Temp 97.9°F | Ht 64.0 in | Wt 237.4 lb

## 2019-11-09 DIAGNOSIS — K589 Irritable bowel syndrome without diarrhea: Secondary | ICD-10-CM

## 2019-11-09 DIAGNOSIS — E1169 Type 2 diabetes mellitus with other specified complication: Secondary | ICD-10-CM

## 2019-11-09 DIAGNOSIS — I10 Essential (primary) hypertension: Secondary | ICD-10-CM

## 2019-11-09 DIAGNOSIS — F4321 Adjustment disorder with depressed mood: Secondary | ICD-10-CM | POA: Diagnosis not present

## 2019-11-09 DIAGNOSIS — E1140 Type 2 diabetes mellitus with diabetic neuropathy, unspecified: Secondary | ICD-10-CM

## 2019-11-09 DIAGNOSIS — R109 Unspecified abdominal pain: Secondary | ICD-10-CM | POA: Diagnosis not present

## 2019-11-09 DIAGNOSIS — K2100 Gastro-esophageal reflux disease with esophagitis, without bleeding: Secondary | ICD-10-CM

## 2019-11-09 DIAGNOSIS — E785 Hyperlipidemia, unspecified: Secondary | ICD-10-CM

## 2019-11-09 MED ORDER — METRONIDAZOLE 500 MG PO TABS: 500.0000 mg | ORAL_TABLET | Freq: Four times a day (QID) | ORAL | 0 refills | Status: DC

## 2019-11-09 MED ORDER — CIPROFLOXACIN HCL 500 MG PO TABS: 500.0000 mg | ORAL_TABLET | Freq: Two times a day (BID) | ORAL | 0 refills | Status: DC

## 2019-11-09 NOTE — Progress Notes (Signed)
Subjective:    Patient ID: [name], female    DOB: May 18, 1946, 74 y.o.   MRN: [id number]  Hypertension This is a chronic problem. The current episode started more than 1 year ago. Pertinent negatives include no chest pain or shortness of breath. Risk factors for coronary artery disease include dyslipidemia and post-menopausal state. Treatments tried: COZAAR,HCTZ. There are no compliance problems.    Patient stepped in straight pin wed but having no problems Patient had second Covid vaccine on Wed pm and has not been feeling well since then Patient reports ongoing stomach issues   Review of Systems  Constitutional: Negative for activity change, appetite change and fatigue.  HENT: Negative for congestion and rhinorrhea.   Respiratory: Negative for cough and shortness of breath.   Cardiovascular: Negative for chest pain and leg swelling.  Gastrointestinal: Negative for abdominal pain and diarrhea.  Endocrine: Negative for polydipsia and polyphagia.  Skin: Negative for color change.  Neurological: Negative for dizziness and weakness.  Psychiatric/Behavioral: Negative for behavioral problems and confusion.       Objective:   Physical Exam Vitals reviewed.  Constitutional:      General: She is not in acute distress. HENT:     Head: Normocephalic and atraumatic.  Eyes:     General:        Right eye: No discharge.        Left eye: No discharge.  Neck:     Trachea: No tracheal deviation.  Cardiovascular:     Rate and Rhythm: Normal rate and regular rhythm.     Heart sounds: Normal heart sounds. No murmur.  Pulmonary:     Effort: Pulmonary effort is normal. No respiratory distress.     Breath sounds: Normal breath sounds.  Lymphadenopathy:     Cervical: No cervical adenopathy.  Skin:    General: Skin is warm and dry.  Neurological:     Mental Status: She is alert.     Coordination: Coordination normal.  Psychiatric:        Behavior: Behavior normal.    30  minutes spent with patient between chart preparation and documentation and time spent with patient patient undergoing significant grief related issues but she believes she will work her way out of this no need for antidepressant currently       Assessment & Plan:  1. Abdominal pain, left lateral Significant abdominal pains on the left lateral side could be IBS could be possibly diverticulitis 1 week worth of antibiotics recommended if not better within 1 week notify us no need to do CAT scan currently  2. Essential hypertension Blood pressure under good control continue current measures watch diet stay active try to lose weight - Basic metabolic panel - Hepatic function panel - CBC with Differential/Platelet  3. Irritable bowel syndrome, unspecified type IBS symptoms could be playing a role in this recent loss of her husband could be playing a role in this due to stress  4. Gastroesophageal reflux disease with esophagitis without hemorrhage Reflux related issues takes her medicine denies any major trouble currently  5. Type 2 diabetes mellitus with diabetic neuropathy, without long-term current use of insulin ([place]) Diabetes reportedly under good control but we will go ahead and check lab work await the results of this - Hemoglobin A1c - Microalbumin / creatinine urine ratio - Basic metabolic panel - Hepatic function panel - CBC with Differential/Platelet  6. Hyperlipidemia associated with type 2 diabetes mellitus ([place]) Hyperlipidemia take medication previous labs reviewed  new labs ordered continue medicine - Lipid panel - Hepatic function panel - CBC with Differential/Platelet  7. Morbid obesity ([facility]) Patient will watch portions try to stay active try to bring her weight down this could help allow her better health if she can bring her weight down  8. Grief Patient with some depression symptoms but I believe that all this is related into grief from her husband we talked about  this at length patient currently does not want to be on medications.  She will do a follow-up with [place] in 4 months

## 2019-11-22 DIAGNOSIS — E785 Hyperlipidemia, unspecified: Secondary | ICD-10-CM | POA: Diagnosis not present

## 2019-11-22 DIAGNOSIS — I10 Essential (primary) hypertension: Secondary | ICD-10-CM | POA: Diagnosis not present

## 2019-11-22 DIAGNOSIS — E1169 Type 2 diabetes mellitus with other specified complication: Secondary | ICD-10-CM | POA: Diagnosis not present

## 2019-11-22 DIAGNOSIS — E1140 Type 2 diabetes mellitus with diabetic neuropathy, unspecified: Secondary | ICD-10-CM | POA: Diagnosis not present

## 2019-11-23 LAB — HEPATIC FUNCTION PANEL
ALT: 20 IU/L (ref 0–32)
AST: 24 IU/L (ref 0–40)
Albumin: 4.3 g/dL (ref 3.7–4.7)
Alkaline Phosphatase: 80 IU/L (ref 39–117)
Bilirubin Total: 0.3 mg/dL (ref 0.0–1.2)
Bilirubin, Direct: 0.12 mg/dL (ref 0.00–0.40)
Total Protein: 6.9 g/dL (ref 6.0–8.5)

## 2019-11-23 LAB — BASIC METABOLIC PANEL
BUN/Creatinine Ratio: 16 (ref 12–28)
BUN: 13 mg/dL (ref 8–27)
CO2: 26 mmol/L (ref 20–29)
Calcium: 9.9 mg/dL (ref 8.7–10.3)
Chloride: 103 mmol/L (ref 96–106)
Creatinine, Ser: 0.83 mg/dL (ref 0.57–1.00)
GFR calc Af Amer: 82 mL/min/{1.73_m2} (ref >59)
GFR calc non Af Amer: 71 mL/min/{1.73_m2} (ref >59)
Glucose: 105 mg/dL — ABNORMAL HIGH (ref 65–99)
Potassium: 3.9 mmol/L (ref 3.5–5.2)
Sodium: 144 mmol/L (ref 134–144)

## 2019-11-23 LAB — CBC WITH DIFFERENTIAL/PLATELET
Basophils Absolute: 0.1 10*3/uL (ref 0.0–0.2)
Basos: 1 %
EOS (ABSOLUTE): 0.2 10*3/uL (ref 0.0–0.4)
Eos: 4 %
Hematocrit: 40.8 % (ref 34.0–46.6)
Hemoglobin: 13 g/dL (ref 11.1–15.9)
Immature Grans (Abs): 0 10*3/uL (ref 0.0–0.1)
Immature Granulocytes: 0 %
Lymphocytes Absolute: 3.2 10*3/uL — ABNORMAL HIGH (ref 0.7–3.1)
Lymphs: 54 %
MCH: 25.7 pg — ABNORMAL LOW (ref 26.6–33.0)
MCHC: 31.9 g/dL (ref 31.5–35.7)
MCV: 81 fL (ref 79–97)
Monocytes Absolute: 0.4 10*3/uL (ref 0.1–0.9)
Monocytes: 7 %
Neutrophils Absolute: 2 10*3/uL (ref 1.4–7.0)
Neutrophils: 34 %
Platelets: 323 10*3/uL (ref 150–450)
RBC: 5.06 x10E6/uL (ref 3.77–5.28)
RDW: 15.1 % (ref 11.7–15.4)
WBC: 5.9 10*3/uL (ref 3.4–10.8)

## 2019-11-23 LAB — LIPID PANEL
Chol/HDL Ratio: 2.5 ratio (ref 0.0–4.4)
Cholesterol, Total: 119 mg/dL (ref 100–199)
HDL: 47 mg/dL (ref >39)
LDL Chol Calc (NIH): 55 mg/dL (ref 0–99)
Triglycerides: 89 mg/dL (ref 0–149)
VLDL Cholesterol Cal: 17 mg/dL (ref 5–40)

## 2019-11-23 LAB — MICROALBUMIN / CREATININE URINE RATIO
Creatinine, Urine: 177.8 mg/dL
Microalb/Creat Ratio: 6 mg/g creat (ref 0–29)
Microalbumin, Urine: 10.1 ug/mL

## 2019-11-23 LAB — HEMOGLOBIN A1C
Est. average glucose Bld gHb Est-mCnc: 151 mg/dL
Hgb A1c MFr Bld: 6.9 % — ABNORMAL HIGH (ref 4.8–5.6)

## 2019-11-25 ENCOUNTER — Encounter: Payer: Self-pay | Admitting: Family Medicine

## 2019-11-28 ENCOUNTER — Other Ambulatory Visit: Payer: Self-pay | Admitting: Family Medicine

## 2019-12-18 ENCOUNTER — Other Ambulatory Visit: Payer: Self-pay | Admitting: *Deleted

## 2019-12-18 MED ORDER — LOSARTAN POTASSIUM 100 MG PO TABS: ORAL_TABLET | ORAL | 0 refills | Status: DC

## 2019-12-25 ENCOUNTER — Telehealth: Payer: Self-pay | Admitting: Family Medicine

## 2019-12-25 NOTE — Telephone Encounter (Signed)
Pt prescription is correct.

## 2019-12-25 NOTE — Telephone Encounter (Signed)
Pt would like to verify her losartan (COZAAR) 100 MG tablet    States she use to take a whole tablet wants to make sure script is correct at 1/2 tablet.

## 2020-01-16 ENCOUNTER — Encounter: Payer: Self-pay | Admitting: Nurse Practitioner

## 2020-01-16 ENCOUNTER — Ambulatory Visit: Payer: Medicare HMO | Admitting: Nurse Practitioner

## 2020-01-16 ENCOUNTER — Other Ambulatory Visit: Payer: Self-pay

## 2020-01-16 ENCOUNTER — Telehealth: Payer: Self-pay | Admitting: *Deleted

## 2020-01-16 VITALS — BP 176/76 | Temp 97.3°F | Ht 64.0 in | Wt 237.4 lb

## 2020-01-16 DIAGNOSIS — K2100 Gastro-esophageal reflux disease with esophagitis, without bleeding: Secondary | ICD-10-CM

## 2020-01-16 DIAGNOSIS — K581 Irritable bowel syndrome with constipation: Secondary | ICD-10-CM

## 2020-01-16 DIAGNOSIS — Z86010 Personal history of colonic polyps: Secondary | ICD-10-CM

## 2020-01-16 MED ORDER — TRULANCE 3 MG PO TABS: 1.0000 | ORAL_TABLET | Freq: Every day | ORAL | 2 refills | Status: DC

## 2020-01-16 MED ORDER — NA SULFATE-K SULFATE-MG SULF 17.5-3.13-1.6 GM/177ML PO SOLN: 1.0000 | Freq: Once | ORAL | 0 refills | Status: AC

## 2020-01-16 NOTE — Patient Instructions (Signed)
Your health issues we discussed today were:   Irritable bowel syndrome constipation type (IBS-C): 1. Your abdominal discomfort and constipation are likely from irritable bowel syndrome 2. I am sorry we have not been able to find a medication other than Linzess that is effective 3. We will try Trulance with a prescription sent to your pharmacy 4. Call us if Trulance is too expensive.  If you can afford it, call us after 1 or 2 weeks of trying it to see if it is effective 5. If Linzess is the only effective option we can try to fight with the insurance company to get it covered better 6. Further recommendations will follow  Need for colonoscopy: 1. We will schedule your colonoscopy for you 2. We will plan for an extended prep to help promote a good cleanout for your colonoscopy 3. Further recommendations will follow your colonoscopy  Overall I recommend:  1. Continue your other current medications 2. Return for follow-up in 3 months 3. Call us if you have any questions or concerns.   ---------------------------------------------------------------  I am glad you have gotten your COVID-19 vaccination!  Even though you are fully vaccinated you should continue to follow CDC and state/local guidelines.  ---------------------------------------------------------------   At [facility] we value your feedback. You may receive a survey about your visit today. Please share your experience as we strive to create trusting relationships with our patients to provide genuine, compassionate, quality care.  We appreciate your understanding and patience as we review any laboratory studies, imaging, and other diagnostic tests that are ordered as we care for you. Our office policy is 5 business days for review of these results, and any emergent or urgent results are addressed in a timely manner for your best interest. If you do not hear from our office in 1 week, please contact us.   We also  encourage the use of MyChart, which contains your medical information for your review as well. If you are not enrolled in this feature, an access code is on this after visit summary for your convenience. Thank you for allowing [place] to be involved in your care.  It was great to see you today!  I hope you have a great Summer!!

## 2020-01-16 NOTE — Assessment & Plan Note (Signed)
Symptoms adequately managed on Protonix.  Recommend she continue her current medications.  Diet weight loss would help with her symptoms and potential to come off PPI.  Call for any worsening or recurrent symptoms, follow-up in 3 months

## 2020-01-16 NOTE — Progress Notes (Signed)
  Referring Provider: Luking, Scott A, MD Primary Care Physician:  Luking, Scott A, MD Primary GI:  Dr. Rourk (in the absence of Dr. Fields); pending Dr. Carver  Chief Complaint  Patient presents with  . Abdominal Pain    "all over"  . Constipation    not able to get refill for Linzess d/t insurance  . Diarrhea    HPI:   [name] is a 74 y.o. female who presents for follow-up on abdominal pain and diarrhea as well as to schedule 5 year repeat colonoscopy.  The patient was last seen in our office 07/19/2019 for GERD, IBS, generalized abdominal pain.  Noted chronic history of IBS, GERD.  EGD up-to-date and recommended PPI due to chronic gastritis.  History of diverticulitis.  Constipation with trial and failure of multiple medications.  Colonoscopy up-to-date 2016 with polyps at recommended 5-year repeat in 2021.  Previous attempt with dicyclomine but she stopped taking it because it made her feel bad.  Recommended small, frequent, healthier snacks with proteins, fruits, vegetables, whole-grain carbohydrates.  Glucerna for supplementation.  At her last visit having constipation for which she tried Linzess and it helped.  Ongoing generalized lower abdominal pain which goes away with a bowel movement.  Intermittent nausea that improves after a bowel movement as well.  GERD doing well on Protonix.  No other overt GI complaints.  Recommended trial of Linzess 145 mcg with prescription sent to the pharmacy, call for worsening symptoms, continue Protonix, use Bentyl as needed for diarrhea.  Follow-up in 6 months.  Today she states she's doing ok overall. She states Linzess is tier 3 and her copay would be $125/month. Previously tried/failed Colace with and without MiraLAX, lactulose (bloating, inconsistent), Amitiza, multiple OTC options. Not sure if we have tried Trulance. No on any chronic pain medications. Has a bowel movement about twice a week, straining, hard stools. Has abdominal  pain, bloating, nausea. Has lower abdominal discomfort and when it happens, it's like she can't walk. Denies vomiting, fever, chills. Denies hematochezia, melena, unintentional weight loss. Denies URI or flu-like symptoms. Denies loss of sense of taste or smell. The patient has received COVID-19 vaccination(s). Denies chest pain, dyspnea, dizziness, lightheadedness, syncope, near syncope. Denies any other upper or lower GI symptoms.  GERD adequately managed on Protonix.  Past Medical History:  Diagnosis Date  . 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 Cyst on liver  . Depression   . Diabetes mellitus    controlled by diet  . Diverticulitis   . DVT of axillary vein, acute right (HCC) 11/24/2012   Dx on Jan 28, 2012.    . Fibrocystic breast disease   . Hepatic cyst   . Hypertension   . IBS (irritable bowel syndrome)   . Impaired glucose tolerance   . Menopause     Past Surgical History:  Procedure Laterality Date  . ABDOMINAL HYSTERECTOMY    . BACK SURGERY    . BREAST SURGERY  right   cyst removed  . CAST APPLICATION  02/14/2012   Procedure: CAST APPLICATION;  Surgeon: Stanley E Harrison, MD;  Location: [facility];  Service: Orthopedics;  Laterality: Right;  procedure room   . CHOLECYSTECTOMY    . COLONOSCOPY  12/04/2004   NUR:Few small diverticula at sigmoid colon/Small cecal polyp ablated   . COLONOSCOPY N/A 09/24/2014   SLF: small internal hemorrhoids  . ESOPHAGOGASTRODUODENOSCOPY N/A 02/02/2016   Procedure: ESOPHAGOGASTRODUODENOSCOPY (EGD);  Surgeon: Sandi L Fields, MD;    Location: [facility];  Service: Endoscopy;  Laterality: N/A;  930   . KNEE ARTHROCENTESIS  left  . TUBAL LIGATION      Current Outpatient Medications  Medication Sig Dispense Refill  . acarbose (PRECOSE) 25 MG tablet Take 25 mg by mouth daily.     . ACCU-CHEK AVIVA PLUS test strip TEST BLOOD SUGAR ONE TIME DAILY 50 strip 0  . Accu-Chek Softclix Lancets lancets USE  TO TEST BLOOD SUGAR ONE TIME DAILY 100 each  5  . acetaminophen (TYLENOL) 500 MG tablet Take 1,000 mg by mouth every 6 (six) hours as needed for mild pain, moderate pain or headache.     . Alcohol Swabs (B-D SINGLE USE SWABS REGULAR) PADS USE EVERY DAY 100 each 5  . blood glucose meter kit and supplies Dispense based on patient and insurance preference. Use to check sugar once daily. E11.9 1 each 0  . clopidogrel (PLAVIX) 75 MG tablet Take 1 tablet by mouth once daily 90 tablet 0  . dicyclomine (BENTYL) 10 MG capsule Take 1 capsule (10 mg total) by mouth 3 (three) times daily as needed for spasms (with/before meals). 30 capsule 3  . hydrochlorothiazide (HYDRODIURIL) 25 MG tablet TAKE 1 TABLET EVERY DAY 90 tablet 1  . linaclotide (LINZESS) 145 MCG CAPS capsule Take 1 capsule (145 mcg total) by mouth daily before breakfast. (Patient taking differently: Take 145 mcg by mouth as needed. ) 90 capsule 3  . losartan (COZAAR) 100 MG tablet Take one half tablet daily 45 tablet 0  . Multiple Vitamins-Minerals (MULTIVITAMIN WITH MINERALS) tablet Take 1 tablet by mouth daily.    . pantoprazole (PROTONIX) 40 MG tablet TAKE 1 TABLET EVERY DAY 90 tablet 1  . pravastatin (PRAVACHOL) 20 MG tablet TAKE 1 TABLET AT BEDTIME 90 tablet 1   No current facility-administered medications for this visit.    Allergies as of 01/16/2020 - Review Complete 01/16/2020  Allergen Reaction Noted  . Norvasc [amlodipine besylate] Other (See Comments) 11/21/2012  . Penicillins Hives and Other (See Comments)   . Advair diskus [fluticasone-salmeterol] Palpitations 11/21/2012  . Metformin and related Other (See Comments) 09/28/2013    Family History  Problem Relation Age of Onset  . Stroke Mother   . Heart attack Mother   . Cancer Sister   . Diabetes Sister   . Seizures Brother   . Diabetes Brother   . Heart disease Brother   . Kidney disease Son   . Colon cancer Neg Hx     Social History   Socioeconomic History  . Marital status: Widowed    Spouse name: Not on  file  . Number of children: Not on file  . Years of education: Not on file  . Highest education level: Not on file  Occupational History  . Occupation: Retired  Tobacco Use  . Smoking status: Never Smoker  . Smokeless tobacco: Never Used  . Tobacco comment: Never smoked  Substance and Sexual Activity  . Alcohol use: No    Alcohol/week: 0.0 standard drinks  . Drug use: No  . Sexual activity: Yes  Other Topics Concern  . Not on file  Social History Narrative  . Not on file   Social Determinants of Health   Financial Resource Strain:   . Difficulty of Paying Living Expenses:   Food Insecurity:   . Worried About Running Out of Food in the Last Year:   . Ran Out of Food in the Last Year:   Transportation Needs:   .   Lack of Transportation (Medical):   . Lack of Transportation (Non-Medical):   Physical Activity:   . Days of Exercise per Week:   . Minutes of Exercise per Session:   Stress:   . Feeling of Stress :   Social Connections:   . Frequency of Communication with Friends and Family:   . Frequency of Social Gatherings with Friends and Family:   . Attends Religious Services:   . Active Member of Clubs or Organizations:   . Attends Club or Organization Meetings:   . Marital Status:     Subjective: Review of Systems  Constitutional: Negative for chills, fever, malaise/fatigue and weight loss.  HENT: Negative for congestion and sore throat.   Respiratory: Negative for cough and shortness of breath.   Cardiovascular: Negative for chest pain and palpitations.  Gastrointestinal: Positive for abdominal pain (lower abdomen), constipation and nausea. Negative for blood in stool, diarrhea, heartburn, melena and vomiting.  Musculoskeletal: Negative for joint pain and myalgias.  Skin: Negative for rash.  Neurological: Negative for dizziness.  Endo/Heme/Allergies: Does not bruise/bleed easily.  All other systems reviewed and are negative.    Objective: Temp (!) 97.3 F  (36.3 C) (Temporal)   Ht 5' 4" (1.626 m)   Wt 237 lb 6.4 oz (107.7 kg)   BMI 40.75 kg/m  Physical Exam Vitals and nursing note reviewed.  Constitutional:      General: She is not in acute distress.    Appearance: Normal appearance. She is well-developed. She is not ill-appearing, toxic-appearing or diaphoretic.  HENT:     Head: Normocephalic and atraumatic.     Nose: No congestion or rhinorrhea.  Eyes:     General: No scleral icterus. Cardiovascular:     Rate and Rhythm: Normal rate and regular rhythm.     Heart sounds: Normal heart sounds.  Pulmonary:     Effort: Pulmonary effort is normal. No respiratory distress.     Breath sounds: Normal breath sounds.  Abdominal:     General: Bowel sounds are normal.     Palpations: Abdomen is soft. There is no hepatomegaly, splenomegaly or mass.     Tenderness: There is abdominal tenderness (mild) in the suprapubic area and left lower quadrant. There is no guarding or rebound.     Hernia: No hernia is present.  Skin:    General: Skin is warm and dry.     Coloration: Skin is not jaundiced.     Findings: No rash.  Neurological:     General: No focal deficit present.     Mental Status: She is alert and oriented to person, place, and time.  Psychiatric:        Attention and Perception: Attention normal.        Mood and Affect: Mood normal.        Speech: Speech normal.        Behavior: Behavior normal.        Thought Content: Thought content normal.        Cognition and Memory: Cognition and memory normal.       01/16/2020 8:56 AM   Disclaimer: This note was dictated with voice recognition software. Similar sounding words can inadvertently be transcribed and may not be corrected upon review.  

## 2020-01-16 NOTE — Telephone Encounter (Signed)
Pt called back. She is scheduled for TCS with RMR 02/15/2020 at 8:30am. Aware sending in Rx for prep. If pharmacy does not have it or too expensive she is too call us back to let us know. Patient aware she will need 1.5 preps so sending in 2 rx's to pharmacy. Patient aware will also send prep instructions with her covid test appt to her. Confirmed mailing address.  --- Per EG if suprep is not covered can do miralax as follows:  [9:52 AM] [place]     ok. The day before the prep have her do a course of the 5 tsp MiraLAX in 4-6 oz clears. Also clear liquids that day as well.     the day before MiraLAX can be in the evening ----------------   PA approved via [employer] website for TCS. Auth# [id number] dates 02/15/2020-03/16/2020

## 2020-01-16 NOTE — H&P (View-Only) (Signed)
Referring Provider: [name], MD Primary Care Physician:  [name], MD Primary GI:  Dr. Gala Romney (in the absence of Dr. Oneida Alar); pending Dr. Abbey Chatters  Chief Complaint  Patient presents with  . Abdominal Pain    "all over"  . Constipation    not able to get refill for [employer]  . Diarrhea    HPI:   [name] is a 74 y.o. female who presents for follow-up on abdominal pain and diarrhea as well as to schedule 5 year repeat colonoscopy.  The patient was last seen in our office 07/19/2019 for GERD, IBS, generalized abdominal pain.  Noted chronic history of IBS, GERD.  EGD up-to-date and recommended PPI due to chronic gastritis.  History of diverticulitis.  Constipation with trial and failure of multiple medications.  Colonoscopy up-to-date 2016 with polyps at recommended 5-year repeat in 2021.  Previous attempt with dicyclomine but she stopped taking it because it made her feel bad.  Recommended small, frequent, healthier snacks with proteins, fruits, vegetables, whole-grain carbohydrates.  Glucerna for supplementation.  At her last visit having constipation for which she tried Linzess and it helped.  Ongoing generalized lower abdominal pain which goes away with a bowel movement.  Intermittent nausea that improves after a bowel movement as well.  GERD doing well on Protonix.  No other overt GI complaints.  Recommended trial of Linzess 145 mcg with prescription sent to the pharmacy, call for worsening symptoms, continue Protonix, use Bentyl as needed for diarrhea.  Follow-up in 6 months.  Today she states she's doing ok overall. She states Linzess is tier 3 and her copay would be $125/month. Previously tried/failed Colace with and without MiraLAX, lactulose (bloating, inconsistent), Amitiza, multiple OTC options. Not sure if we have tried Trulance. No on any chronic pain medications. Has a bowel movement about twice a week, straining, hard stools. Has abdominal  pain, bloating, nausea. Has lower abdominal discomfort and when it happens, it's like she can't walk. Denies vomiting, fever, chills. Denies hematochezia, melena, unintentional weight loss. Denies URI or flu-like symptoms. Denies loss of sense of taste or smell. The patient has received COVID-19 vaccination(s). Denies chest pain, dyspnea, dizziness, lightheadedness, syncope, near syncope. Denies any other upper or lower GI symptoms.  GERD adequately managed on Protonix.  Past Medical History:  Diagnosis Date  . Blood clot of artery under arm ([place])   . Cyst on liver  . Depression   . Diabetes mellitus    controlled by diet  . Diverticulitis   . DVT of axillary vein, acute right ([place]) 11/24/2012   Dx on Jan 28, 2012.    . Fibrocystic breast disease   . Hepatic cyst   . Hypertension   . IBS (irritable bowel syndrome)   . Impaired glucose tolerance   . Menopause     Past Surgical History:  Procedure Laterality Date  . ABDOMINAL HYSTERECTOMY    . BACK SURGERY    . BREAST SURGERY  right   cyst removed  . CAST APPLICATION  5/63/8937   Procedure: CAST APPLICATION;  Surgeon: Carole Civil, MD;  Location: [facility];  Service: Orthopedics;  Laterality: Right;  procedure room   . CHOLECYSTECTOMY    . COLONOSCOPY  12/04/2004   NUR:Few small diverticula at sigmoid colon/Small cecal polyp ablated   . COLONOSCOPY N/A 09/24/2014   SLF: small internal hemorrhoids  . ESOPHAGOGASTRODUODENOSCOPY N/A 02/02/2016   Procedure: ESOPHAGOGASTRODUODENOSCOPY (EGD);  Surgeon: Danie Binder, MD;  Location[facility];  Service: Endoscopy;  Laterality: N/A;  930   . KNEE ARTHROCENTESIS  left  . TUBAL LIGATION      Current Outpatient Medications  Medication Sig Dispense Refill  . acarbose (PRECOSE) 25 MG tablet Take 25 mg by mouth daily.     [name] ACCU-CHEK AVIVA PLUS test strip TEST BLOOD SUGAR ONE TIME DAILY 50 strip 0  . Accu-Chek Softclix Lancets lancets USE  TO TEST BLOOD SUGAR ONE TIME DAILY 100 each  5  . acetaminophen (TYLENOL) 500 MG tablet Take 1,000 mg by mouth every 6 (six) hours as needed for mild pain, moderate pain or headache.     . Alcohol Swabs (B-D SINGLE USE SWABS REGULAR) PADS USE EVERY DAY 100 each 5  . blood glucose meter kit and supplies Dispense based on patient and insurance preference. Use to check sugar once daily. E11.9 1 each 0  . clopidogrel (PLAVIX) 75 MG tablet Take 1 tablet by mouth once daily 90 tablet 0  . dicyclomine (BENTYL) 10 MG capsule Take 1 capsule (10 mg total) by mouth 3 (three) times daily as needed for spasms (with/before meals). 30 capsule 3  . hydrochlorothiazide (HYDRODIURIL) 25 MG tablet TAKE 1 TABLET EVERY DAY 90 tablet 1  . linaclotide (LINZESS) 145 MCG CAPS capsule Take 1 capsule (145 mcg total) by mouth daily before breakfast. (Patient taking differently: Take 145 mcg by mouth as needed. ) 90 capsule 3  . losartan (COZAAR) 100 MG tablet Take one half tablet daily 45 tablet 0  . Multiple Vitamins-Minerals (MULTIVITAMIN WITH MINERALS) tablet Take 1 tablet by mouth daily.    . pantoprazole (PROTONIX) 40 MG tablet TAKE 1 TABLET EVERY DAY 90 tablet 1  . pravastatin (PRAVACHOL) 20 MG tablet TAKE 1 TABLET AT BEDTIME 90 tablet 1   No current facility-administered medications for this visit.    Allergies as of 01/16/2020 - Review Complete 01/16/2020  Allergen Reaction Noted  . Norvasc [amlodipine besylate] Other (See Comments) 11/21/2012  . Penicillins Hives and Other (See Comments)   . Advair diskus [fluticasone-salmeterol] Palpitations 11/21/2012  . Metformin and related Other (See Comments) 09/28/2013    Family History  Problem Relation Age of Onset  . Stroke Mother   . Heart attack Mother   . Cancer Sister   . Diabetes Sister   . Seizures Brother   . Diabetes Brother   . Heart disease Brother   . Kidney disease Son   . Colon cancer Neg Hx     Social History   Socioeconomic History  . Marital status: Widowed    Spouse name: Not on  file  . Number of children: Not on file  . Years of education: Not on file  . Highest education level: Not on file  Occupational History  . Occupation: Retired  Tobacco Use  . Smoking status: Never Smoker  . Smokeless tobacco: Never Used  . Tobacco comment: Never smoked  Substance and Sexual Activity  . Alcohol use: No    Alcohol/week: 0.0 standard drinks  . Drug use: No  . Sexual activity: Yes  Other Topics Concern  . Not on file  Social History Narrative  . Not on file   Social Determinants of Health   Financial Resource Strain:   . Difficulty of Paying Living Expenses:   Food Insecurity:   . Worried About Charity fundraiser in the Last Year:   . Arboriculturist in the Last Year:   Transportation Needs:   .  Lack of Transportation (Medical):   [name] Lack of Transportation (Non-Medical):   Physical Activity:   . Days of Exercise per Week:   . Minutes of Exercise per Session:   Stress:   . Feeling of Stress :   Social Connections:   . Frequency of Communication with Friends and Family:   . Frequency of Social Gatherings with Friends and Family:   . Attends Religious Services:   . Active Member of Clubs or Organizations:   . Attends Archivist Meetings:   [name] Marital Status:     Subjective: Review of Systems  Constitutional: Negative for chills, fever, malaise/fatigue and weight loss.  HENT: Negative for congestion and sore throat.   Respiratory: Negative for cough and shortness of breath.   Cardiovascular: Negative for chest pain and palpitations.  Gastrointestinal: Positive for abdominal pain (lower abdomen), constipation and nausea. Negative for blood in stool, diarrhea, heartburn, melena and vomiting.  Musculoskeletal: Negative for joint pain and myalgias.  Skin: Negative for rash.  Neurological: Negative for dizziness.  Endo/Heme/Allergies: Does not bruise/bleed easily.  All other systems reviewed and are negative.    Objective: Temp (!) 97.3 F  (36.3 C) (Temporal)   Ht _0  (1.626 m)   Wt 237 lb 6.4 oz (107.7 kg)   BMI 40.75 kg/m  Physical Exam Vitals and nursing note reviewed.  Constitutional:      General: She is not in acute distress.    Appearance: Normal appearance. She is well-developed. She is not ill-appearing, toxic-appearing or diaphoretic.  HENT:     Head: Normocephalic and atraumatic.     Nose: No congestion or rhinorrhea.  Eyes:     General: No scleral icterus. Cardiovascular:     Rate and Rhythm: Normal rate and regular rhythm.     Heart sounds: Normal heart sounds.  Pulmonary:     Effort: Pulmonary effort is normal. No respiratory distress.     Breath sounds: Normal breath sounds.  Abdominal:     General: Bowel sounds are normal.     Palpations: Abdomen is soft. There is no hepatomegaly, splenomegaly or mass.     Tenderness: There is abdominal tenderness (mild) in the suprapubic area and left lower quadrant. There is no guarding or rebound.     Hernia: No hernia is present.  Skin:    General: Skin is warm and dry.     Coloration: Skin is not jaundiced.     Findings: No rash.  Neurological:     General: No focal deficit present.     Mental Status: She is alert and oriented to person, place, and time.  Psychiatric:        Attention and Perception: Attention normal.        Mood and Affect: Mood normal.        Speech: Speech normal.        Behavior: Behavior normal.        Thought Content: Thought content normal.        Cognition and Memory: Cognition and memory normal.       01/16/2020 8:56 AM   Disclaimer: This note was dictated with voice recognition software. Similar sounding words can inadvertently be transcribed and may not be corrected upon review.

## 2020-01-16 NOTE — Addendum Note (Signed)
Addended by: Gordy Levan, ERIC A on: 01/16/2020 09:17 AM   Modules accepted: Orders

## 2020-01-16 NOTE — Assessment & Plan Note (Signed)
History of IBS constipation type.  Linzess 145 mcg works well, although insurance covers that a tier 3 and it costs more than she can afford.  We have tried essentially every other medication available for constipation other than Trulance.  She has tried and failed all of the others.  I will attempt a prescription of Trulance to see if it is covered by insurance and/or it is effective.  I will provide her a couple boxes of Linzess if Trulance is not affordable.  If we are left with Linzess is the only effective option we can call insurance to request a tier exception.  I feel the majority of her symptoms are because of her constipation.  Further recommendations to follow trial of medications.  Call our office with affordability so that we know whether to proceed with tier exception request.  Follow-up in 3 months.

## 2020-01-16 NOTE — Assessment & Plan Note (Signed)
Last colonoscopy 5 years ago with adenomatous colon polyps.  Recommended 5-year repeat, she is currently due.  We will proceed with scheduling at this time.  We will plan for an extended prep due to her chronic constipation in order to promote good prep.  Further recommendations to follow colonoscopy  Proceed with TCS with Dr. Gala Romney (in the absence of Dr. Oneida Alar) in near future: the risks, benefits, and alternatives have been discussed with the patient in detail. The patient states understanding and desires to proceed.  The patient is currently on Plavix which is generally not helpful for screening/surveillance. The patient is not on any other anticoagulants, anxiolytics, chronic pain medications, antidepressants, antidiabetics, or iron supplements.  Denies alcohol and drug use.  Conscious sedation should be adequate for her procedure as it was for her last.

## 2020-01-16 NOTE — Telephone Encounter (Signed)
Called pt, VM not set up. Per encounter form patient needs TCS with RMR, 2 days of clears, 1 1/2 prep doses

## 2020-01-16 NOTE — Progress Notes (Signed)
Cc'ed to pcp °

## 2020-01-21 ENCOUNTER — Other Ambulatory Visit: Payer: Self-pay

## 2020-01-21 ENCOUNTER — Telehealth: Payer: Self-pay | Admitting: Internal Medicine

## 2020-01-21 ENCOUNTER — Ambulatory Visit (INDEPENDENT_AMBULATORY_CARE_PROVIDER_SITE_OTHER): Payer: Medicare HMO | Admitting: Family Medicine

## 2020-01-21 ENCOUNTER — Encounter: Payer: Self-pay | Admitting: Family Medicine

## 2020-01-21 VITALS — BP 134/76 | Temp 97.7°F | Wt 239.4 lb

## 2020-01-21 DIAGNOSIS — R079 Chest pain, unspecified: Secondary | ICD-10-CM | POA: Diagnosis not present

## 2020-01-21 DIAGNOSIS — M940 Chondrocostal junction syndrome [Tietze]: Secondary | ICD-10-CM | POA: Diagnosis not present

## 2020-01-21 MED ORDER — HYDROCODONE-ACETAMINOPHEN 5-325 MG PO TABS: 1.0000 | ORAL_TABLET | Freq: Four times a day (QID) | ORAL | 0 refills | Status: AC | PRN

## 2020-01-21 MED ORDER — MELOXICAM 15 MG PO TABS: 15.0000 mg | ORAL_TABLET | Freq: Every day | ORAL | 0 refills | Status: DC

## 2020-01-21 NOTE — Telephone Encounter (Signed)
Pt said the prep prescription was too expensive. She uses [facility] in [place], Please advise. [phone]

## 2020-01-21 NOTE — Progress Notes (Signed)
   Subjective:    Patient ID: [name], female    DOB: 04-May-1946, 74 y.o.   MRN: [id number]  HPI Pt has been having sharp pain through left breast area. Pain does radiate to left upper arm and back.  Pt states that the pain is making her nauseous. Began this morning. Pain starts at middle of chest. Pt has taken Tylenol but no help.  Patient denies substernal denies shortness of breath She states she had severe sharp pain in the pectoral region that hit her when she rolled over and it radiated into the upper left arm She also found that pain was worse with movement. She did have a [facility] cardio imaging last fall which was low risk She denies shortness of breath denies pulmonary embolism symptoms  Review of Systems Please see above    Objective:   Physical Exam  Lungs clear respiratory rate normal heart regular no murmurs breast exam left side normal Significant tenderness costo chondral border as well as the lower ribs intercostal muscles Very tender in the costochondral border as well as the lower ribs EKG no acute changes compared to previous EKG from September 2020    Assessment & Plan:  Chest pain Costochondritis Severe costochondritis recommend anti-inflammatory over the course of the next 10 days I also recommend pain medication but only use sparingly caution drowsiness not for frequent use home use only  Patient has colonoscopy coming up she will discuss with GI doctor regarding a less expensive prep

## 2020-01-22 ENCOUNTER — Ambulatory Visit: Payer: Medicare HMO | Admitting: Family Medicine

## 2020-01-22 ENCOUNTER — Encounter: Payer: Self-pay | Admitting: Family Medicine

## 2020-01-29 ENCOUNTER — Telehealth: Payer: Self-pay | Admitting: Emergency Medicine

## 2020-01-29 NOTE — Telephone Encounter (Addendum)
PA started on trulance 3mg   Key: [id number]   PA was approved. However,   [employer] said medication will be $200 for 90 day supply Requested a tier reduction and reduction was denied. [employer] stated they do not do tier reductions on non formulary drugs Pt is unable to afford medication. Is there anything else she can be placed on?

## 2020-01-29 NOTE — Telephone Encounter (Signed)
Pt called wanted to know what she needed to do before her colonoscopy. She states she is on a blood thinner and she is not sure if she should d/c it before the procedure   Pt also called and stated she needs me to call her insurance to get a tier reduction for her trulance. She stated it is over $1000. Notified pt I will work on that today or tomorrow. Human [id number]:[id number]

## 2020-01-30 ENCOUNTER — Other Ambulatory Visit: Payer: Self-pay

## 2020-01-30 MED ORDER — CLOPIDOGREL BISULFATE 75 MG PO TABS: 75.0000 mg | ORAL_TABLET | Freq: Every day | ORAL | 3 refills | Status: DC

## 2020-01-30 NOTE — Telephone Encounter (Signed)
Refilled plavix 

## 2020-02-01 ENCOUNTER — Other Ambulatory Visit: Payer: Self-pay

## 2020-02-01 ENCOUNTER — Other Ambulatory Visit: Payer: Self-pay | Admitting: *Deleted

## 2020-02-01 MED ORDER — PRAVASTATIN SODIUM 20 MG PO TABS: 20.0000 mg | ORAL_TABLET | Freq: Every day | ORAL | 1 refills | Status: DC

## 2020-02-01 MED ORDER — HYDROCHLOROTHIAZIDE 25 MG PO TABS: 25.0000 mg | ORAL_TABLET | Freq: Every day | ORAL | 1 refills | Status: DC

## 2020-02-01 MED ORDER — CLOPIDOGREL BISULFATE 75 MG PO TABS: 75.0000 mg | ORAL_TABLET | Freq: Every day | ORAL | 3 refills | Status: DC

## 2020-02-01 NOTE — Telephone Encounter (Signed)
Refilled plavix 

## 2020-02-05 NOTE — Telephone Encounter (Signed)
At this point she has tried/failed or cannot afford: Linzess (price), Colace with and without MiraLAX, lactulose (bloating, inconsistent), Amitiza, multiple OTC options, Trulance (price). She is not on chronic pain meds and not a candidate for Movantik, Relestor, Symproic.  At this point I think the only thing we can really do is Colace 100mg  bid, MiraLAX once daily, increase to twice daily if once daily dosing not effective.  Call us kn 2 weeks and let us know if that is working at all. Could add Fiber (benefiber, psyllium husk, other fiber options) if Colace bid/MiraLAX bid not effective.

## 2020-02-06 NOTE — Telephone Encounter (Signed)
Called notified pt of providers recommendations. Pt stated after her colonoscopy she will do colace 100mg  bid, mirlax once a day increase to twice daily if dosing is not effective Pt also stated she will let us know if this is working in two weeks  And that she could add fiber (benefiber, psyllium, or other fiber options)  if colace and miralax is not effective. Pt stated she understood and thanked me for the call

## 2020-02-13 ENCOUNTER — Other Ambulatory Visit: Payer: Self-pay

## 2020-02-13 ENCOUNTER — Other Ambulatory Visit (HOSPITAL_COMMUNITY)
Admission: RE | Admit: 2020-02-13 | Discharge: 2020-02-13 | Disposition: A | Discharge status: Home / Self Care | Payer: Medicare HMO | Source: Ambulatory Visit | Attending: Internal Medicine | Admitting: Internal Medicine

## 2020-02-13 DIAGNOSIS — Z20822 Contact with and (suspected) exposure to covid-19: Secondary | ICD-10-CM | POA: Insufficient documentation

## 2020-02-13 DIAGNOSIS — Z01812 Encounter for preprocedural laboratory examination: Secondary | ICD-10-CM | POA: Diagnosis not present

## 2020-02-14 LAB — SARS CORONAVIRUS 2 (TAT 6-24 HRS): SARS Coronavirus 2: NEGATIVE

## 2020-02-15 ENCOUNTER — Other Ambulatory Visit: Payer: Self-pay

## 2020-02-15 ENCOUNTER — Encounter (HOSPITAL_COMMUNITY)
Admission: RE | Disposition: A | Discharge status: Home / Self Care | Payer: Self-pay | Source: Home / Self Care | Attending: Internal Medicine

## 2020-02-15 ENCOUNTER — Ambulatory Visit (HOSPITAL_COMMUNITY)
Admission: RE | Admit: 2020-02-15 | Discharge: 2020-02-15 | Disposition: A | Discharge status: Home / Self Care | Payer: Medicare HMO | Attending: Internal Medicine | Admitting: Internal Medicine

## 2020-02-15 ENCOUNTER — Encounter (HOSPITAL_COMMUNITY): Payer: Self-pay | Admitting: Internal Medicine

## 2020-02-15 DIAGNOSIS — Z9049 Acquired absence of other specified parts of digestive tract: Secondary | ICD-10-CM | POA: Diagnosis not present

## 2020-02-15 DIAGNOSIS — K219 Gastro-esophageal reflux disease without esophagitis: Secondary | ICD-10-CM | POA: Insufficient documentation

## 2020-02-15 DIAGNOSIS — E119 Type 2 diabetes mellitus without complications: Secondary | ICD-10-CM | POA: Diagnosis not present

## 2020-02-15 DIAGNOSIS — D120 Benign neoplasm of cecum: Secondary | ICD-10-CM | POA: Diagnosis not present

## 2020-02-15 DIAGNOSIS — Z833 Family history of diabetes mellitus: Secondary | ICD-10-CM | POA: Insufficient documentation

## 2020-02-15 DIAGNOSIS — Z90710 Acquired absence of both cervix and uterus: Secondary | ICD-10-CM | POA: Diagnosis not present

## 2020-02-15 DIAGNOSIS — Z79899 Other long term (current) drug therapy: Secondary | ICD-10-CM | POA: Insufficient documentation

## 2020-02-15 DIAGNOSIS — Z86718 Personal history of other venous thrombosis and embolism: Secondary | ICD-10-CM | POA: Insufficient documentation

## 2020-02-15 DIAGNOSIS — K635 Polyp of colon: Secondary | ICD-10-CM | POA: Diagnosis not present

## 2020-02-15 DIAGNOSIS — Z86010 Personal history of colonic polyps: Secondary | ICD-10-CM | POA: Diagnosis not present

## 2020-02-15 DIAGNOSIS — K589 Irritable bowel syndrome without diarrhea: Secondary | ICD-10-CM | POA: Diagnosis not present

## 2020-02-15 DIAGNOSIS — R1030 Lower abdominal pain, unspecified: Secondary | ICD-10-CM | POA: Diagnosis not present

## 2020-02-15 DIAGNOSIS — Z841 Family history of disorders of kidney and ureter: Secondary | ICD-10-CM | POA: Insufficient documentation

## 2020-02-15 DIAGNOSIS — D122 Benign neoplasm of ascending colon: Secondary | ICD-10-CM | POA: Diagnosis not present

## 2020-02-15 DIAGNOSIS — Z8249 Family history of ischemic heart disease and other diseases of the circulatory system: Secondary | ICD-10-CM | POA: Insufficient documentation

## 2020-02-15 DIAGNOSIS — K5730 Diverticulosis of large intestine without perforation or abscess without bleeding: Secondary | ICD-10-CM | POA: Diagnosis not present

## 2020-02-15 DIAGNOSIS — K640 First degree hemorrhoids: Secondary | ICD-10-CM | POA: Diagnosis not present

## 2020-02-15 DIAGNOSIS — Z7984 Long term (current) use of oral hypoglycemic drugs: Secondary | ICD-10-CM | POA: Diagnosis not present

## 2020-02-15 DIAGNOSIS — Z7951 Long term (current) use of inhaled steroids: Secondary | ICD-10-CM | POA: Diagnosis not present

## 2020-02-15 DIAGNOSIS — Z820 Family history of epilepsy and other diseases of the nervous system: Secondary | ICD-10-CM | POA: Diagnosis not present

## 2020-02-15 DIAGNOSIS — Z823 Family history of stroke: Secondary | ICD-10-CM | POA: Insufficient documentation

## 2020-02-15 DIAGNOSIS — I10 Essential (primary) hypertension: Secondary | ICD-10-CM | POA: Insufficient documentation

## 2020-02-15 HISTORY — PX: COLONOSCOPY: SHX5424

## 2020-02-15 HISTORY — PX: POLYPECTOMY: SHX5525

## 2020-02-15 LAB — GLUCOSE, CAPILLARY: Glucose-Capillary: 120 mg/dL — ABNORMAL HIGH (ref 70–99)

## 2020-02-15 SURGERY — COLONOSCOPY
Anesthesia: Moderate Sedation

## 2020-02-15 MED ORDER — MEPERIDINE HCL 100 MG/ML IJ SOLN
INTRAMUSCULAR | Status: DC | PRN
  Administered 2020-02-15: 15 mg
  Administered 2020-02-15: 25 mg

## 2020-02-15 MED ORDER — ONDANSETRON HCL 4 MG/2ML IJ SOLN
INTRAMUSCULAR | Status: DC | PRN
  Administered 2020-02-15: 4 mg via INTRAVENOUS

## 2020-02-15 MED ORDER — MIDAZOLAM HCL 5 MG/5ML IJ SOLN
INTRAMUSCULAR | Status: DC | PRN
  Administered 2020-02-15: 1 mg via INTRAVENOUS
  Administered 2020-02-15: 2 mg via INTRAVENOUS
  Administered 2020-02-15: 1 mg via INTRAVENOUS

## 2020-02-15 MED ORDER — MIDAZOLAM HCL 5 MG/5ML IJ SOLN
INTRAMUSCULAR | Status: AC
  Filled 2020-02-15: qty 10

## 2020-02-15 MED ORDER — MEPERIDINE HCL 50 MG/ML IJ SOLN
INTRAMUSCULAR | Status: AC
  Filled 2020-02-15: qty 1

## 2020-02-15 MED ORDER — ONDANSETRON HCL 4 MG/2ML IJ SOLN
INTRAMUSCULAR | Status: AC
  Filled 2020-02-15: qty 2

## 2020-02-15 MED ORDER — SODIUM CHLORIDE 0.9 % IV SOLN
INTRAVENOUS | Status: DC
  Administered 2020-02-15: 1000 mL via INTRAVENOUS

## 2020-02-15 NOTE — Discharge Instructions (Signed)
Colonoscopy Discharge Instructions  Read the instructions outlined below and refer to this sheet in the next few weeks. These discharge instructions provide you with general information on caring for yourself after you leave the hospital. Your doctor may also give you specific instructions. While your treatment has been planned according to the most current medical practices available, unavoidable complications occasionally occur. If you have any problems or questions after discharge, call Dr. Gala Romney at [phone]. ACTIVITY  You may resume your regular activity, but move at a slower pace for the next 24 hours.   Take frequent rest periods for the next 24 hours.   Walking will help get rid of the air and reduce the bloated feeling in your belly (abdomen).   No driving for 24 hours (because of the medicine (anesthesia) used during the test).    Do not sign any important legal documents or operate any machinery for 24 hours (because of the anesthesia used during the test).  NUTRITION  Drink plenty of fluids.   You may resume your normal diet as instructed by your doctor.   Begin with a light meal and progress to your normal diet. Heavy or fried foods are harder to digest and may make you feel sick to your stomach (nauseated).   Avoid alcoholic beverages for 24 hours or as instructed.  MEDICATIONS  You may resume your normal medications unless your doctor tells you otherwise.  WHAT YOU CAN EXPECT TODAY  Some feelings of bloating in the abdomen.   Passage of more gas than usual.   Spotting of blood in your stool or on the toilet paper.  IF YOU HAD POLYPS REMOVED DURING THE COLONOSCOPY:  No aspirin products for 7 days or as instructed.   No alcohol for 7 days or as instructed.   Eat a soft diet for the next 24 hours.  FINDING OUT THE RESULTS OF YOUR TEST Not all test results are available during your visit. If your test results are not back during the visit, make an appointment  with your caregiver to find out the results. Do not assume everything is normal if you have not heard from your caregiver or the medical facility. It is important for you to follow up on all of your test results.  SEEK IMMEDIATE MEDICAL ATTENTION IF:  You have more than a spotting of blood in your stool.   Your belly is swollen (abdominal distention).   You are nauseated or vomiting.   You have a temperature over 101.   You have abdominal pain or discomfort that is severe or gets worse throughout the day.    1 polyp removed from your colon today  Polyp and diverticulosis information provided  Further recommendations to follow pending review of pathology report  At patient request, I called Felicia at [phone] and reviewed results   Hemorrhoids Hemorrhoids are swollen veins in and around the rectum or anus. There are two types of hemorrhoids:  Internal hemorrhoids. These occur in the veins that are just inside the rectum. They may poke through to the outside and become irritated and painful.  External hemorrhoids. These occur in the veins that are outside the anus and can be felt as a painful swelling or hard lump near the anus. Most hemorrhoids do not cause serious problems, and they can be managed with home treatments such as diet and lifestyle changes. If home treatments do not help the symptoms, procedures can be done to shrink or remove the hemorrhoids. What are the  causes? This condition is caused by increased pressure in the anal area. This pressure may result from various things, including:  Constipation.  Straining to have a bowel movement.  Diarrhea.  Pregnancy.  Obesity.  Sitting for long periods of time.  Heavy lifting or other activity that causes you to strain.  Anal sex.  Riding a bike for a long period of time. What are the signs or symptoms? Symptoms of this condition include:  Pain.  Anal itching or irritation.  Rectal bleeding.  Leakage  of stool (feces).  Anal swelling.  One or more lumps around the anus. How is this diagnosed? This condition can often be diagnosed through a visual exam. Other exams or tests may also be done, such as:  An exam that involves feeling the rectal area with a gloved hand (digital rectal exam).  An exam of the anal canal that is done using a small tube (anoscope).  A blood test, if you have lost a significant amount of blood.  A test to look inside the colon using a flexible tube with a camera on the end (sigmoidoscopy or colonoscopy). How is this treated? This condition can usually be treated at home. However, various procedures may be done if dietary changes, lifestyle changes, and other home treatments do not help your symptoms. These procedures can help make the hemorrhoids smaller or remove them completely. Some of these procedures involve surgery, and others do not. Common procedures include:  Rubber band ligation. Rubber bands are placed at the base of the hemorrhoids to cut off their blood supply.  Sclerotherapy. Medicine is injected into the hemorrhoids to shrink them.  Infrared coagulation. A type of light energy is used to get rid of the hemorrhoids.  Hemorrhoidectomy surgery. The hemorrhoids are surgically removed, and the veins that supply them are tied off.  Stapled hemorrhoidopexy surgery. The surgeon staples the base of the hemorrhoid to the rectal wall. Follow these instructions at home: Eating and drinking   Eat foods that have a lot of fiber in them, such as whole grains, beans, nuts, fruits, and vegetables.  Ask your health care provider about taking products that have added fiber (fiber supplements).  Reduce the amount of fat in your diet. You can do this by eating low-fat dairy products, eating less red meat, and avoiding processed foods.  Drink enough fluid to keep your urine pale yellow. Managing pain and swelling   Take warm sitz baths for 20 minutes, 3-4  times a day to ease pain and discomfort. You may do this in a bathtub or using a portable sitz bath that fits over the toilet.  If directed, apply ice to the affected area. Using ice packs between sitz baths may be helpful. ? Put ice in a plastic bag. ? Place a towel between your skin and the bag. ? Leave the ice on for 20 minutes, 2-3 times a day. General instructions  Take over-the-counter and prescription medicines only as told by your health care provider.  Use medicated creams or suppositories as told.  Get regular exercise. Ask your health care provider how much and what kind of exercise is best for you. In general, you should do moderate exercise for at least 30 minutes on most days of the week (150 minutes each week). This can include activities such as walking, biking, or yoga.  Go to the bathroom when you have the urge to have a bowel movement. Do not wait.  Avoid straining to have bowel movements.  Keep the anal area dry and clean. Use wet toilet paper or moist towelettes after a bowel movement.  Do not sit on the toilet for long periods of time. This increases blood pooling and pain.  Keep all follow-up visits as told by your health care provider. This is important. Contact a health care provider if you have:  Increasing pain and swelling that are not controlled by treatment or medicine.  Difficulty having a bowel movement, or you are unable to have a bowel movement.  Pain or inflammation outside the area of the hemorrhoids. Get help right away if you have:  Uncontrolled bleeding from your rectum. Summary  Hemorrhoids are swollen veins in and around the rectum or anus.  Most hemorrhoids can be managed with home treatments such as diet and lifestyle changes.  Taking warm sitz baths can help ease pain and discomfort.  In severe cases, procedures or surgery can be done to shrink or remove the hemorrhoids. This information is not intended to replace advice given to  you by your health care provider. Make sure you discuss any questions you have with your health care provider. Document Revised: 01/12/2019 Document Reviewed: 01/05/2018 Elsevier Patient Education  [place].  Diverticulosis  Diverticulosis is a condition that develops when small pouches (diverticula) form in the wall of the large intestine (colon). The colon is where water is absorbed and stool (feces) is formed. The pouches form when the inside layer of the colon pushes through weak spots in the outer layers of the colon. You may have a few pouches or many of them. The pouches usually do not cause problems unless they become inflamed or infected. When this happens, the condition is called diverticulitis. What are the causes? The cause of this condition is not known. What increases the risk? The following factors may make you more likely to develop this condition:  Being older than age 22. Your risk for this condition increases with age. Diverticulosis is rare among people younger than age 70. By age 73, many people have it.  Eating a low-fiber diet.  Having frequent constipation.  Being overweight.  Not getting enough exercise.  Smoking.  Taking over-the-counter pain medicines, like aspirin and ibuprofen.  Having a family history of diverticulosis. What are the signs or symptoms? In most people, there are no symptoms of this condition. If you do have symptoms, they may include:  Bloating.  Cramps in the abdomen.  Constipation or diarrhea.  Pain in the lower left side of the abdomen. How is this diagnosed? Because diverticulosis usually has no symptoms, it is most often diagnosed during an exam for other colon problems. The condition may be diagnosed by:  Using a flexible scope to examine the colon (colonoscopy).  Taking an X-ray of the colon after dye has been put into the colon (barium enema).  Having a CT scan. How is this treated? You may not need  treatment for this condition. Your health care provider may recommend treatment to prevent problems. You may need treatment if you have symptoms or if you previously had diverticulitis. Treatment may include:  Eating a high-fiber diet.  Taking a fiber supplement.  Taking a live bacteria supplement (probiotic).  Taking medicine to relax your colon. Follow these instructions at home: Medicines  Take over-the-counter and prescription medicines only as told by your health care provider.  If told by your health care provider, take a fiber supplement or probiotic. Constipation prevention Your condition may cause constipation. To prevent or  treat constipation, you may need to:  Drink enough fluid to keep your urine pale yellow.  Take over-the-counter or prescription medicines.  Eat foods that are high in fiber, such as beans, whole grains, and fresh fruits and vegetables.  Limit foods that are high in fat and processed sugars, such as fried or sweet foods.  General instructions  Try not to strain when you have a bowel movement.  Keep all follow-up visits as told by your health care provider. This is important. Contact a health care provider if you:  Have pain in your abdomen.  Have bloating.  Have cramps.  Have not had a bowel movement in 3 days. Get help right away if:  Your pain gets worse.  Your bloating becomes very bad.  You have a fever or chills, and your symptoms suddenly get worse.  You vomit.  You have bowel movements that are bloody or black.  You have bleeding from your rectum. Summary  Diverticulosis is a condition that develops when small pouches (diverticula) form in the wall of the large intestine (colon).  You may have a few pouches or many of them.  This condition is most often diagnosed during an exam for other colon problems.  Treatment may include increasing the fiber in your diet, taking supplements, or taking medicines. This information is  not intended to replace advice given to you by your health care provider. Make sure you discuss any questions you have with your health care provider. Document Revised: 03/15/2019 Document Reviewed: 03/15/2019 Elsevier Patient Education  [place].   Colon Polyps  Polyps are tissue growths inside the body. Polyps can grow in many places, including the large intestine (colon). A polyp may be a round bump or a mushroom-shaped growth. You could have one polyp or several. Most colon polyps are noncancerous (benign). However, some colon polyps can become cancerous over time. Finding and removing the polyps early can help prevent this. What are the causes? The exact cause of colon polyps is not known. What increases the risk? You are more likely to develop this condition if you: Have a family history of colon cancer or colon polyps. Are older than 64 or older than 45 if you are African American. Have inflammatory bowel disease, such as ulcerative colitis or Crohn's disease. Have certain hereditary conditions, such as: Familial adenomatous polyposis. Lynch syndrome. Turcot syndrome. Peutz-Jeghers syndrome. Are overweight. Smoke cigarettes. Do not get enough exercise. Drink too much alcohol. Eat a diet that is high in fat and red meat and low in fiber. Had childhood cancer that was treated with abdominal radiation. What are the signs or symptoms? Most polyps do not cause symptoms. If you have symptoms, they may include: Blood coming from your rectum when having a bowel movement. Blood in your stool. The stool may look dark red or black. Abdominal pain. A change in bowel habits, such as constipation or diarrhea. How is this diagnosed? This condition is diagnosed with a colonoscopy. This is a procedure in which a lighted, flexible scope is inserted into the anus and then passed into the colon to examine the area. Polyps are sometimes found when a colonoscopy is done as part of  routine cancer screening tests. How is this treated? Treatment for this condition involves removing any polyps that are found. Most polyps can be removed during a colonoscopy. Those polyps will then be tested for cancer. Additional treatment may be needed depending on the results of testing. Follow these instructions at home:  Lifestyle Maintain a healthy weight, or lose weight if recommended by your health care provider. Exercise every day or as told by your health care provider. Do not use any products that contain nicotine or tobacco, such as cigarettes and e-cigarettes. If you need help quitting, ask your health care provider. If you drink alcohol, limit how much you have: 0-1 drink a day for women. 0-2 drinks a day for men. Be aware of how much alcohol is in your drink. In the [place] one drink equals one 12 oz bottle of beer (355 mL), one 5 oz glass of wine (148 mL), or one 1 oz shot of hard liquor (44 mL). Eating and drinking  Eat foods that are high in fiber, such as fruits, vegetables, and whole grains. Eat foods that are high in calcium and vitamin D, such as milk, cheese, yogurt, eggs, liver, fish, and broccoli. Limit foods that are high in fat, such as fried foods and desserts. Limit the amount of red meat and processed meat you eat, such as hot dogs, sausage, bacon, and lunch meats. General instructions Keep all follow-up visits as told by your health care provider. This is important. This includes having regularly scheduled colonoscopies. Talk to your health care provider about when you need a colonoscopy. Contact a health care provider if: You have new or worsening bleeding during a bowel movement. You have new or increased blood in your stool. You have a change in bowel habits. You lose weight for no known reason. Summary Polyps are tissue growths inside the body. Polyps can grow in many places, including the colon. Most colon polyps are noncancerous (benign), but some can  become cancerous over time. This condition is diagnosed with a colonoscopy. Treatment for this condition involves removing any polyps that are found. Most polyps can be removed during a colonoscopy. This information is not intended to replace advice given to you by your health care provider. Make sure you discuss any questions you have with your health care provider. Document Revised: 12/01/2017 Document Reviewed: 12/01/2017 Elsevier Patient Education  [place].

## 2020-02-15 NOTE — Op Note (Signed)
[facility] Patient Name: [name] Procedure Date: 02/15/2020 8:30 AM MRN: [id number] Date of Birth: 1946-04-24 Attending MD: Norvel Richards , MD CSN: [id number] Age: 74 Admit Type: Outpatient Procedure:                Colonoscopy Indications:              High risk colon cancer surveillance: Personal                            history of colonic polyps Providers:                Norvel Richards, MD, Janeece Riggers, RN, Crystal                            Page, Nelma Rothman, Technician Referring MD:              Medicines:                Midazolam 4 mg IV, Meperidine 40 mg IV, Ondansetron                            4 mg IV Complications:            No immediate complications. Estimated Blood Loss:     Estimated blood loss was minimal. Procedure:                Pre-Anesthesia Assessment:                           - Prior to the procedure, a History and Physical                            was performed, and patient medications and                            allergies were reviewed. The patient's tolerance of                            previous anesthesia was also reviewed. The risks                            and benefits of the procedure and the sedation                            options and risks were discussed with the patient.                            All questions were answered, and informed consent                            was obtained. Prior Anticoagulants: The patient has                            taken no previous anticoagulant or antiplatelet  agents. ASA Grade Assessment: II - A patient with                            mild systemic disease. After reviewing the risks                            and benefits, the patient was deemed in                            satisfactory condition to undergo the procedure.                           After obtaining informed consent, the colonoscope                            was passed under  direct vision. Throughout the                            procedure, the patient's blood pressure, pulse, and                            oxygen saturations were monitored continuously. The                            CF-[id number] ([id number]) scope was introduced through                            the anus and advanced to the the cecum, identified                            by appendiceal orifice and ileocecal valve. The                            colonoscopy was performed without difficulty. The                            patient tolerated the procedure well. The quality                            of the bowel preparation was adequate. Scope In: 8:56:10 AM Scope Out: 9:12:53 AM Scope Withdrawal Time: 0 hours 13 minutes 45 seconds  Total Procedure Duration: 0 hours 16 minutes 43 seconds  Findings:      A 4 mm polyp was found in the cecum. The polyp was sessile. The polyp       was removed with a cold snare. Resection and retrieval were complete.       Estimated blood loss was minimal.      Scattered small and large-mouthed diverticula were found in the entire       colon.      Non-bleeding internal hemorrhoids were found during retroflexion. The       hemorrhoids were moderate, medium-sized and Grade I (internal       hemorrhoids that do not prolapse).      The exam was otherwise without abnormality on direct and retroflexion  views. Impression:               - One 4 mm polyp in the cecum, removed with a cold                            snare. Resected and retrieved.                           - Diverticulosis in the entire examined colon.                           - Non-bleeding internal hemorrhoids.                           - The examination was otherwise normal on direct                            and retroflexion views. Moderate Sedation:      Moderate (conscious) sedation was administered by the endoscopy nurse       and supervised by the endoscopist. The following parameters were        monitored: oxygen saturation, heart rate, blood pressure, respiratory       rate, EKG, adequacy of pulmonary ventilation, and response to care.       Total physician intraservice time was 20 minutes. Recommendation:           - Patient has a contact number available for                            emergencies. The signs and symptoms of potential                            delayed complications were discussed with the                            patient. Return to normal activities tomorrow.                            Written discharge instructions were provided to the                            patient.                           - Resume previous diet.                           - Continue present medications.                           - Repeat colonoscopy date to be determined after                            pending pathology results are reviewed for                            surveillance based on pathology  results.                           - Return to GI office (date not yet determined). Procedure Code(s):        --- Professional ---                           [phone], Colonoscopy, flexible; with removal of                            tumor(s), polyp(s), or other lesion(s) by snare                            technique                           G0500, Moderate sedation services provided by the                            same physician or other qualified health care                            professional performing a gastrointestinal                            endoscopic service that sedation supports,                            requiring the presence of an independent trained                            observer to assist in the monitoring of the                            patient's level of consciousness and physiological                            status; initial 15 minutes of intra-service time;                            patient age 78 years or older (additional time may                             be reported with [phone], as appropriate) Diagnosis Code(s):        --- Professional ---                           Z86.010, Personal history of colonic polyps                           K63.5, Polyp of colon                           K64.0, First degree hemorrhoids  K57.30, Diverticulosis of large intestine without                            perforation or abscess without bleeding CPT copyright 2019 American Medical Association. All rights reserved. The codes documented in this report are preliminary and upon coder review may  be revised to meet current compliance requirements. Cristopher Estimable. Gwyn Hieronymus, MD Norvel Richards, MD 02/15/2020 9:20:53 AM This report has been signed electronically. Number of Addenda: 0

## 2020-02-15 NOTE — Interval H&P Note (Signed)
History and Physical Interval Note:  02/15/2020 8:46 AM  [name]  has presented today for surgery, with the diagnosis of screening, hx polyps.  The various methods of treatment have been discussed with the patient and family. After consideration of risks, benefits and other options for treatment, the patient has consented to  Procedure(s) with comments: COLONOSCOPY (N/A) - 8:30am as a surgical intervention.  The patient's history has been reviewed, patient examined, no change in status, stable for surgery.  I have reviewed the patient's chart and labs.  Questions were answered to the patient's satisfaction.     [name]  No change.  Surveillance colonoscopy per plan.  The risks, benefits, limitations, alternatives and imponderables have been reviewed with the patient. Questions have been answered. All parties are agreeable.

## 2020-02-19 ENCOUNTER — Encounter: Payer: Self-pay | Admitting: Internal Medicine

## 2020-02-19 LAB — SURGICAL PATHOLOGY

## 2020-02-20 ENCOUNTER — Encounter (HOSPITAL_COMMUNITY): Payer: Self-pay | Admitting: Internal Medicine

## 2020-02-22 ENCOUNTER — Telehealth: Payer: Self-pay | Admitting: Family Medicine

## 2020-02-22 ENCOUNTER — Other Ambulatory Visit: Payer: Self-pay

## 2020-02-22 ENCOUNTER — Ambulatory Visit (INDEPENDENT_AMBULATORY_CARE_PROVIDER_SITE_OTHER): Payer: Medicare HMO | Admitting: Cardiology

## 2020-02-22 VITALS — BP 142/68 | HR 66 | Ht 64.0 in | Wt 241.0 lb

## 2020-02-22 DIAGNOSIS — Z79899 Other long term (current) drug therapy: Secondary | ICD-10-CM | POA: Diagnosis not present

## 2020-02-22 MED ORDER — CHLORTHALIDONE 25 MG PO TABS
25.0000 mg | ORAL_TABLET | Freq: Every day | ORAL | 3 refills | Status: DC
Start: 2020-02-22 — End: 2020-03-10

## 2020-02-22 NOTE — Telephone Encounter (Signed)
Patient seen her heart doctor today Dr. Harl Bowie and he wants to change her blood pressure medication because her pressure was 142/80. Patient states he wanted to change her medication to chlorthalidone 25 mg and her to stop taking the hydrochlorothiazide 25 mg but it was too expensive for she states and wants to discuss it with you first. Please advise

## 2020-02-22 NOTE — Patient Instructions (Addendum)
Medication Instructions:  Your physician has recommended you make the following change in your medication:  1.  STOP the Hydrochlorothiazide 2.  START Chlorthalidone 25 mg taking 1 tablet daily  *If you need a refill on your cardiac medications before your next appointment, please call your pharmacy*   Lab Work: 2 WEEKS: 03/07/20: COME TO THE LAB FOR BMET & MAGNESIUM   If you have labs (blood work) drawn today and your tests are completely normal, you will receive your results only by: [name] MyChart Message (if you have MyChart) OR . A paper copy in the mail If you have any lab test that is abnormal or we need to change your treatment, we will call you to review the results.   Testing/Procedures: None ordered   Follow-Up: At [facility], you and your health needs are our priority.  As part of our continuing mission to provide you with exceptional heart care, we have created designated Provider Care Teams.  These Care Teams include your primary Cardiologist (physician) and Advanced Practice Providers (APPs -  Physician Assistants and Nurse Practitioners) who all work together to provide you with the care you need, when you need it.  We recommend signing up for the patient portal called "MyChart".  Sign up information is provided on this After Visit Summary.  MyChart is used to connect with patients for Virtual Visits (Telemedicine).  Patients are able to view lab/test results, encounter notes, upcoming appointments, etc.  Non-urgent messages can be sent to your provider as well.   To learn more about what you can do with MyChart, go to [email or web address].    Your next appointment:   6 month(s)  The format for your next appointment:   In Person  Provider:   [name], MD   Other Instructions

## 2020-02-22 NOTE — Progress Notes (Signed)
Clinical Summary [name] is a 74 y.o.female  1. Chest pain - long history of chest pain - caths in 2005 and 2012 with just mild disease.  - nuclear stress 07/2016 without ischemia  she reports asked not to take aspirin in the past by GI, has been on plavix as alternative given mild CAD by prior cath and multiple risk factors     - seen by pcp 12/2019, thought to have costochondritis - ongoing discomfort midhest, can be sensitive to position - pain/soreness hours and days at time.   2. Hyperlipidemia - stomach troubles on lipitor, tolerating pravastatin.   - 10/2019 TC 119 TG 89 HDL 47 LDL 55  3. HTN - compliant with meds   SH: oldest son has CKDIV, pending dialysis. 71 yo sistershe helps take care of. Past Medical History:  Diagnosis Date  . Blood clot of artery under arm ([name])   . Cyst on liver  . Depression   . Diabetes mellitus    controlled by diet  . Diverticulitis   . DVT of axillary vein, acute right ([place]) 11/24/2012   Dx on Jan 28, 2012.    . Fibrocystic breast disease   . Hepatic cyst   . Hypertension   . IBS (irritable bowel syndrome)   . Impaired glucose tolerance   . Menopause      Allergies  Allergen Reactions  . Norvasc [Amlodipine Besylate] Other (See Comments)    Reaction:  Fatigue   . Penicillins Hives and Other (See Comments)    Has patient had a PCN reaction causing immediate rash, facial/tongue/throat swelling, SOB or lightheadedness with hypotension: Yes Has patient had a PCN reaction causing severe rash involving mucus membranes or skin necrosis: No Has patient had a PCN reaction that required hospitalization No Has patient had a PCN reaction occurring within the last 10 years: No If all of the above answers are "NO", then may proceed with Cephalosporin use.   . Advair Diskus [Fluticasone-Salmeterol] Palpitations  . Metformin And Related Other (See Comments)    Reaction:  GI upset      Current Outpatient  Medications  Medication Sig Dispense Refill  . acarbose (PRECOSE) 25 MG tablet Take 25 mg by mouth daily.     [name] ACCU-CHEK AVIVA PLUS test strip TEST BLOOD SUGAR ONE TIME DAILY 50 strip 0  . Accu-Chek Softclix Lancets lancets USE  TO TEST BLOOD SUGAR ONE TIME DAILY 100 each 5  . acetaminophen (TYLENOL) 500 MG tablet Take 1,000 mg by mouth every 6 (six) hours as needed for mild pain, moderate pain or headache.     . Alcohol Swabs (B-D SINGLE USE SWABS REGULAR) PADS USE EVERY DAY 100 each 5  . blood glucose meter kit and supplies Dispense based on patient and insurance preference. Use to check sugar once daily. E11.9 1 each 0  . clopidogrel (PLAVIX) 75 MG tablet Take 1 tablet (75 mg total) by mouth daily. (Patient taking differently: Take 75 mg by mouth at bedtime. ) 90 tablet 3  . gabapentin (NEURONTIN) 100 MG capsule Take 100 mg by mouth at bedtime.    . hydrochlorothiazide (HYDRODIURIL) 25 MG tablet Take 1 tablet (25 mg total) by mouth daily. 90 tablet 1  . HYDROcodone-acetaminophen (NORCO/VICODIN) 5-325 MG tablet Take 1 tablet by mouth every 6 (six) hours as needed for moderate pain.    [name] linaclotide (LINZESS) 145 MCG CAPS capsule Take 1 capsule (145 mcg total) by mouth daily before breakfast. (Patient taking  differently: Take 145 mcg by mouth daily. ) 90 capsule 3  . losartan (COZAAR) 100 MG tablet Take one half tablet daily (Patient taking differently: Take 50 mg by mouth daily. ) 45 tablet 0  . meloxicam (MOBIC) 15 MG tablet Take 1 tablet (15 mg total) by mouth daily. 14 tablet 0  . Multiple Vitamins-Minerals (MULTIVITAMIN WITH MINERALS) tablet Take 1 tablet by mouth daily.    . pantoprazole (PROTONIX) 40 MG tablet TAKE 1 TABLET EVERY DAY (Patient taking differently: Take 40 mg by mouth daily. ) 90 tablet 1  . pravastatin (PRAVACHOL) 20 MG tablet Take 1 tablet (20 mg total) by mouth at bedtime. 90 tablet 1   No current facility-administered medications for this visit.     Past Surgical  History:  Procedure Laterality Date  . ABDOMINAL HYSTERECTOMY    . BACK SURGERY    . BREAST SURGERY  right   cyst removed  . CAST APPLICATION  5/99/3570   Procedure: CAST APPLICATION;  Surgeon: Carole Civil, MD;  Location: [facility];  Service: Orthopedics;  Laterality: Right;  procedure room   . CHOLECYSTECTOMY    . COLONOSCOPY  12/04/2004   NUR:Few small diverticula at sigmoid colon/Small cecal polyp ablated   . COLONOSCOPY N/A 09/24/2014   SLF: small internal hemorrhoids  . COLONOSCOPY N/A 02/15/2020   Procedure: COLONOSCOPY;  Surgeon: [name], MD;  Location: [facility];  Service: Endoscopy;  Laterality: N/A;  8:30am  . ESOPHAGOGASTRODUODENOSCOPY N/A 02/02/2016   Procedure: ESOPHAGOGASTRODUODENOSCOPY (EGD);  Surgeon: Danie Binder, MD;  Location: [facility];  Service: Endoscopy;  Laterality: N/A;  930   . KNEE ARTHROCENTESIS  left  . POLYPECTOMY  02/15/2020   Procedure: POLYPECTOMY;  Surgeon: [name], MD;  Location: [facility];  Service: Endoscopy;;  . TUBAL LIGATION       Allergies  Allergen Reactions  . Norvasc [Amlodipine Besylate] Other (See Comments)    Reaction:  Fatigue   . Penicillins Hives and Other (See Comments)    Has patient had a PCN reaction causing immediate rash, facial/tongue/throat swelling, SOB or lightheadedness with hypotension: Yes Has patient had a PCN reaction causing severe rash involving mucus membranes or skin necrosis: No Has patient had a PCN reaction that required hospitalization No Has patient had a PCN reaction occurring within the last 10 years: No If all of the above answers are "NO", then may proceed with Cephalosporin use.   . Advair Diskus [Fluticasone-Salmeterol] Palpitations  . Metformin And Related Other (See Comments)    Reaction:  GI upset       Family History  Problem Relation Age of Onset  . Stroke Mother   . Heart attack Mother   . Cancer Sister   . Diabetes Sister   . Seizures Brother   .  Diabetes Brother   . Heart disease Brother   . Kidney disease Son   . Colon cancer Neg Hx      Social History [name] reports that she has never smoked. She has never used smokeless tobacco. [name] reports no history of alcohol use.   Review of Systems CONSTITUTIONAL: No weight loss, fever, chills, weakness or fatigue.  HEENT: Eyes: No visual loss, blurred vision, double vision or yellow sclerae.No hearing loss, sneezing, congestion, runny nose or sore throat.  SKIN: No rash or itching.  CARDIOVASCULAR: per hpi RESPIRATORY: No shortness of breath, cough or sputum.  GASTROINTESTINAL: No anorexia, nausea, vomiting or diarrhea. No abdominal pain  or blood.  GENITOURINARY: No burning on urination, no polyuria NEUROLOGICAL: No headache, dizziness, syncope, paralysis, ataxia, numbness or tingling in the extremities. No change in bowel or bladder control.  MUSCULOSKELETAL: No muscle, back pain, joint pain or stiffness.  LYMPHATICS: No enlarged nodes. No history of splenectomy.  PSYCHIATRIC: No history of depression or anxiety.  ENDOCRINOLOGIC: No reports of sweating, cold or heat intolerance. No polyuria or polydipsia.  [name]   Physical Examination Today's Vitals   02/22/20 1114  BP: (!) 142/68  Pulse: 66  SpO2: 97%  Weight: 241 lb (109.3 kg)  Height: 5' 4"  (1.626 m)   Body mass index is 41.37 kg/m.  Gen: resting comfortably, no acute distress HEENT: no scleral icterus, pupils equal round and reactive, no palptable cervical adenopathy,  CV: RRR, no m/r/g, no jvd Resp: Clear to auscultation bilaterally GI: abdomen is soft, non-tender, non-distended, normal bowel sounds, no hepatosplenomegaly MSK: extremities are warm, no edema.  Skin: warm, no rash Neuro:  no focal deficits Psych: appropriate affect   Diagnostic Studies  07/2016 Nuclear stress test  There was no ST segment deviation noted during stress.  The study is normal. No ischemia or  infarction.  This is a low risk study.  Nuclear stress EF: 75%.   05/2011 cath ANGIOGRAPHIC FINDINGS: 1. The left main coronary artery was a short segment and had no disease. 2. The left anterior descending was a large vessel that coursed to the apex and gave off a moderate-sized diagonal [name]. The midportion of the left anterior descending artery had a mild 20% stenosis. The diagonal [name] was free of any disease. 3. Circumflex artery is a moderate-sized vessel that gives off a moderate-sized bifurcating obtuse marginal [name]. This vessel is free of any disease. 4. The right coronary artery is a moderate-sized dominant vessel with no evidence of disease. 5. Left ventricular angiogram was performed in the RAO projection and it showed normal left ventricular systolic function with ejection fraction of 65-70%.  IMPRESSION: 1. Mild nonobstructive coronary artery disease. 2. Normal left ventricular systolic function. 3. Noncardiac chest pain.   06/2004 cath Left main coronary artery was normal.  The left anterior descending artery was normal.  Circumflex coronary artery is normal.  Right coronary artery was somewhat small but dominant. It was normal.  Right ventriculography: The right ventriculography showed hyperdynamic LV function, ejection fraction of 65%. There was no gradient across the aortic valve and no MR. The aortic pressure was 129/66, LV pressure is 130/90.  IMPRESSION: The patient has no evidence of significant coronary artery disease on RAO ventriculogram. The ascending aorta and descending thoracic aorta looked fine with no evidence of dissection. Her chest pain would appear to be noncardiac in etiology. She will be discharged later today to follow up with her primary care M.D.  The patient tolerated the procedure well.   Assessment and Plan  1. Chest pain - long history, multiple  negative workups in the past as outlined above -recent chest wall pain being managed by pcp, no plans for additional cardiac testing.   2. Hyperlipidemia - did not tolerate high dose statin, tolerating pravastatin - lipids are at goal, continue statin  3. HTN -above goal of <130/80, we will change her HCTZ to chlorthalidone 20m daily, bmet/mg in 2 weeks    JArnoldo Lenis M.D.

## 2020-02-24 NOTE — Telephone Encounter (Signed)
This is a reasonable change in medication.  If the patient is unable to afford this medicine I recommend she talk with cardiology potentially they could recommend a different avenue.

## 2020-02-25 ENCOUNTER — Ambulatory Visit: Payer: Medicare HMO | Admitting: Family Medicine

## 2020-02-25 ENCOUNTER — Telehealth: Payer: Self-pay | Admitting: Cardiology

## 2020-02-25 NOTE — Telephone Encounter (Signed)
Left message to return call 

## 2020-02-25 NOTE — Telephone Encounter (Signed)
Pt will start chorthalidone when it comes in today.

## 2020-02-25 NOTE — Telephone Encounter (Signed)
Spoke with pt on the phone about her recent BP med change. She was prescribed a new prescription on Friday. However just recently she was given a 44  Day supply of "Hydrocloroside", so they wouldn't do the new prescription. Michela Pitcher they had the kicked the new prescription out) she stated she has continued taking the Hydrocloroside but not today. Can someone call her to give her advice or next steps to deal with this issue? And let her know if she should continue taking this medicine until she gets the new one.

## 2020-02-25 NOTE — Telephone Encounter (Signed)
Spoke to pt who stated the new med was rejected by insurance because she had just gotten refill on the [facility]ct[facility]. She was advised to contact cardiology about medication and they should be able to do a PA.

## 2020-02-28 DIAGNOSIS — E119 Type 2 diabetes mellitus without complications: Secondary | ICD-10-CM | POA: Diagnosis not present

## 2020-02-28 DIAGNOSIS — H524 Presbyopia: Secondary | ICD-10-CM | POA: Diagnosis not present

## 2020-03-06 ENCOUNTER — Other Ambulatory Visit: Payer: Self-pay

## 2020-03-06 ENCOUNTER — Encounter: Payer: Self-pay | Admitting: Family Medicine

## 2020-03-06 ENCOUNTER — Ambulatory Visit (INDEPENDENT_AMBULATORY_CARE_PROVIDER_SITE_OTHER): Payer: Medicare HMO | Admitting: Family Medicine

## 2020-03-06 VITALS — BP 134/74 | Temp 97.7°F | Wt 240.4 lb

## 2020-03-06 DIAGNOSIS — I10 Essential (primary) hypertension: Secondary | ICD-10-CM | POA: Diagnosis not present

## 2020-03-06 DIAGNOSIS — R519 Headache, unspecified: Secondary | ICD-10-CM

## 2020-03-06 DIAGNOSIS — Z0000 Encounter for general adult medical examination without abnormal findings: Secondary | ICD-10-CM | POA: Diagnosis not present

## 2020-03-06 MED ORDER — LOSARTAN POTASSIUM 50 MG PO TABS: ORAL_TABLET | ORAL | 1 refills | Status: DC

## 2020-03-06 MED ORDER — GABAPENTIN 100 MG PO CAPS: 100.0000 mg | ORAL_CAPSULE | Freq: Every day | ORAL | 1 refills | Status: DC

## 2020-03-06 NOTE — Progress Notes (Signed)
Subjective:    Patient ID: [name], female    DOB: 03-25-1946, 74 y.o.   MRN: [id number]  HPI AWV- Annual Wellness Visit  The patient was seen for their annual wellness visit. The patient's past medical history, surgical history, and family history were reviewed. Pertinent vaccines were reviewed ( tetanus, pneumonia, shingles, flu) The patient's medication list was reviewed and updated.  The height and weight were entered.  BMI recorded in electronic record elsewhere  Cognitive screening was completed. Outcome of Mini - Cog: PASS   Falls /depression screening electronically recorded within record elsewhere  Current tobacco usage: None (All patients who use tobacco were given written and verbal information on quitting)  Recent listing of emergency department/hospitalizations over the past year were reviewed.  current specialist the patient sees on a regular basis: Gastro   Medicare annual wellness visit patient questionnaire was reviewed.  A written screening schedule for the patient for the next 5-10 years was given. Appropriate discussion of followup regarding next visit was discussed.   Defers exam.   Review of Systems  Constitutional: Negative for activity change, appetite change and fatigue.  HENT: Negative for congestion and rhinorrhea.   Eyes: Negative for discharge.  Respiratory: Negative for cough, chest tightness and wheezing.   Cardiovascular: Negative for chest pain.  Gastrointestinal: Negative for abdominal pain, blood in stool and vomiting.  Endocrine: Negative for polyphagia.  Genitourinary: Negative for difficulty urinating and frequency.  Musculoskeletal: Negative for neck pain.  Skin: Negative for color change.  Allergic/Immunologic: Negative for environmental allergies and food allergies.  Neurological: Negative for weakness and headaches.  Psychiatric/Behavioral: Negative for agitation and behavioral problems.       Objective:    Physical Exam Constitutional:      Appearance: She is well-developed.  HENT:     Head: Normocephalic.     Right Ear: External ear normal.     Left Ear: External ear normal.  Eyes:     Pupils: Pupils are equal, round, and reactive to light.  Neck:     Thyroid: No thyromegaly.  Cardiovascular:     Rate and Rhythm: Normal rate and regular rhythm.     Heart sounds: Normal heart sounds. No murmur heard.   Pulmonary:     Effort: Pulmonary effort is normal. No respiratory distress.     Breath sounds: Normal breath sounds. No wheezing.  Abdominal:     General: Bowel sounds are normal. There is no distension.     Palpations: Abdomen is soft. There is no mass.     Tenderness: There is no abdominal tenderness.  Musculoskeletal:        General: No tenderness. Normal range of motion.     Cervical back: Normal range of motion.  Lymphadenopathy:     Cervical: No cervical adenopathy.  Skin:    General: Skin is warm and dry.  Neurological:     Mental Status: She is alert and oriented to person, place, and time.     Motor: No abnormal muscle tone.  Psychiatric:        Behavior: Behavior normal.    Patient defers on breast exam and pelvic exam       Assessment & Plan:  1. Essential hypertension Recently Dr. Harl Bowie changed her diuretic seems to be doing well on this.  We will send update to Dr. Harl Bowie  2. Encounter for subsequent annual wellness visit (AWV) in Medicare patient Adult wellness-complete.wellness physical was conducted today. Importance of diet and exercise  were discussed in detail.  In addition to this a discussion regarding safety was also covered. We also reviewed over immunizations and gave recommendations regarding current immunization needed for age.  In addition to this additional areas were also touched on including: Preventative health exams needed:  Colonoscopy 2021 completed small adenoma was not recommended to have a follow-up colonoscopy per GI  Patient was  advised yearly wellness exam   3. Frequent headaches Patient will keep track of her headaches she will send [place] readings and follow-up in a month to 6 weeks  Patient may well need MRI of the brain

## 2020-03-07 ENCOUNTER — Other Ambulatory Visit: Payer: Self-pay

## 2020-03-07 ENCOUNTER — Other Ambulatory Visit (HOSPITAL_COMMUNITY)
Admission: RE | Admit: 2020-03-07 | Discharge: 2020-03-07 | Disposition: A | Discharge status: Home / Self Care | Payer: Medicare HMO | Source: Ambulatory Visit | Attending: Cardiology | Admitting: Cardiology

## 2020-03-07 DIAGNOSIS — Z79899 Other long term (current) drug therapy: Secondary | ICD-10-CM | POA: Insufficient documentation

## 2020-03-07 LAB — BASIC METABOLIC PANEL
Anion gap: 9 (ref 5–15)
BUN: 15 mg/dL (ref 8–23)
CO2: 30 mmol/L (ref 22–32)
Calcium: 9.4 mg/dL (ref 8.9–10.3)
Chloride: 99 mmol/L (ref 98–111)
Creatinine, Ser: 0.83 mg/dL (ref 0.44–1.00)
GFR calc Af Amer: >60 mL/min (ref >60)
GFR calc non Af Amer: >60 mL/min (ref >60)
Glucose, Bld: 145 mg/dL — ABNORMAL HIGH (ref 70–99)
Potassium: 3.7 mmol/L (ref 3.5–5.1)
Sodium: 138 mmol/L (ref 135–145)

## 2020-03-07 LAB — MAGNESIUM: Magnesium: 2 mg/dL (ref 1.7–2.4)

## 2020-03-10 ENCOUNTER — Other Ambulatory Visit: Payer: Self-pay | Admitting: *Deleted

## 2020-03-10 MED ORDER — CHLORTHALIDONE 25 MG PO TABS: 25.0000 mg | ORAL_TABLET | Freq: Every day | ORAL | 3 refills | Status: DC

## 2020-03-12 DIAGNOSIS — Z0100 Encounter for examination of eyes and vision without abnormal findings: Secondary | ICD-10-CM | POA: Diagnosis not present

## 2020-03-16 ENCOUNTER — Telehealth: Payer: Self-pay | Admitting: Family Medicine

## 2020-03-16 NOTE — Telephone Encounter (Signed)
Nurses Please connect with patient  Last time patient was here for her wellness she also mentioned frequent headaches I recommended for her to do a headache calendar on a regular basis Please touch base with patient Requesting that the patient do headache calendar, keep track of how frequent her headaches are, when they occur, how long they last, any associated symptoms such as vomiting double vision dizziness Send [place] this headache calendar in approximately 2 weeks to 3 weeks Keep follow-up visit later in August thank you  (I did touch base with neurology.  Dr. Lavell Anchors recommended MRI with and without contrast of the brain if headaches persist)

## 2020-03-17 ENCOUNTER — Telehealth: Payer: Self-pay | Admitting: *Deleted

## 2020-03-17 ENCOUNTER — Other Ambulatory Visit: Payer: Self-pay | Admitting: *Deleted

## 2020-03-17 MED ORDER — ACARBOSE 25 MG PO TABS: 25.0000 mg | ORAL_TABLET | Freq: Every day | ORAL | 0 refills | Status: DC

## 2020-03-17 NOTE — Telephone Encounter (Signed)
Laurine Blazer, LPN  1/85/9093 1:12 PM EDT Back to Top    Notified, copy to pcp.

## 2020-03-17 NOTE — Telephone Encounter (Signed)
Left message to return call 

## 2020-03-17 NOTE — Telephone Encounter (Signed)
-----   Message from Arnoldo Lenis, MD sent at 03/14/2020  2:30 PM EDT ----- Labs look fine   Zandra Abts MD

## 2020-03-17 NOTE — Telephone Encounter (Signed)
Patient notified and verbalized understanding. 

## 2020-03-21 ENCOUNTER — Other Ambulatory Visit: Payer: Self-pay | Admitting: Family Medicine

## 2020-03-28 ENCOUNTER — Other Ambulatory Visit: Payer: Self-pay

## 2020-03-28 ENCOUNTER — Ambulatory Visit (INDEPENDENT_AMBULATORY_CARE_PROVIDER_SITE_OTHER): Payer: Medicare HMO

## 2020-03-28 ENCOUNTER — Ambulatory Visit: Payer: Medicare HMO | Admitting: Podiatry

## 2020-03-28 DIAGNOSIS — M25372 Other instability, left ankle: Secondary | ICD-10-CM

## 2020-03-28 DIAGNOSIS — M79672 Pain in left foot: Secondary | ICD-10-CM

## 2020-03-28 DIAGNOSIS — M2042 Other hammer toe(s) (acquired), left foot: Secondary | ICD-10-CM | POA: Diagnosis not present

## 2020-03-28 DIAGNOSIS — E1142 Type 2 diabetes mellitus with diabetic polyneuropathy: Secondary | ICD-10-CM | POA: Diagnosis not present

## 2020-03-28 DIAGNOSIS — M19071 Primary osteoarthritis, right ankle and foot: Secondary | ICD-10-CM

## 2020-03-28 DIAGNOSIS — B351 Tinea unguium: Secondary | ICD-10-CM | POA: Diagnosis not present

## 2020-03-28 DIAGNOSIS — M19079 Primary osteoarthritis, unspecified ankle and foot: Secondary | ICD-10-CM

## 2020-03-28 DIAGNOSIS — L600 Ingrowing nail: Secondary | ICD-10-CM

## 2020-03-28 DIAGNOSIS — M2041 Other hammer toe(s) (acquired), right foot: Secondary | ICD-10-CM

## 2020-03-28 DIAGNOSIS — M79671 Pain in right foot: Secondary | ICD-10-CM

## 2020-03-28 NOTE — Progress Notes (Signed)
  Subjective:  Patient ID: [name], female    DOB: 1945/12/02,  MRN: [id number]  Chief Complaint  Patient presents with  . Nail Problem    Right 1st medial border painful nail "weeks"  . Foot Pain    Bilateral generalized foot and ankle pain, 1 month duration, pt has history of broken ankles.  . Nail Problem    Nail trim 1-5 bilateral    74 y.o. female presents with the above complaint. History confirmed with patient.   Objective:  Physical Exam: warm, good capillary refill, no trophic changes or ulcerative lesions, normal DP and PT pulses, absent protective sensation Left Foot: POP left ATFL, dorsal 2nd TMT. Ingrowing nails both borders of hallux Right Foot: POP dorsal 2nd TMT. Ingrowing nails both borders of hallux.  No images are attached to the encounter.  Radiographs: X-ray of both feet: midfoot degenerative changes left medial ankle osteophytes no other acute fractures. Assessment:   1. Ankle instability, left   2. Arthritis of midfoot   3. Onychomycosis of multiple toenails with type 2 diabetes mellitus and peripheral neuropathy ([place])   4. Ingrown nail    Plan:  Patient was evaluated and treated and all questions answered.  Ingrown Nail -Nails debrided of hallux in slant back fashion  Onychomycosis, Diabetes and DPN -Patient is diabetic with a qualifying condition for at risk foot care.  Procedure: Nail Debridement Rationale: Patient meets criteria for routine foot care due to DPN Type of Debridement: manual, sharp debridement. Instrumentation: Nail nipper, rotary burr. Number of Nails: 10  Midfoot Arthritis -Educated on etiology -XR reviewed with patient -Injection delivered to the painful joint  Procedure: Joint Injection Location: Bilateral 2nd TMT joint Skin Prep: Alcohol. Injectate: 0.5 cc 1% lidocaine plain, 0.5 cc dexamethasone phosphate. Disposition: Patient tolerated procedure well. Injection site dressed with a band-aid.   Ankle  Instability Left -Dispense Trilock ankle brace -Wear for at least 4 weeks.  No follow-ups on file.

## 2020-04-17 ENCOUNTER — Encounter: Payer: Self-pay | Admitting: Nurse Practitioner

## 2020-04-17 ENCOUNTER — Other Ambulatory Visit: Payer: Self-pay

## 2020-04-17 ENCOUNTER — Ambulatory Visit: Payer: Medicare HMO | Admitting: Nurse Practitioner

## 2020-04-17 ENCOUNTER — Telehealth: Payer: Self-pay | Admitting: Family Medicine

## 2020-04-17 VITALS — BP 148/65 | HR 67 | Temp 97.3°F | Ht 64.0 in | Wt 241.0 lb

## 2020-04-17 DIAGNOSIS — R519 Headache, unspecified: Secondary | ICD-10-CM

## 2020-04-17 DIAGNOSIS — K2100 Gastro-esophageal reflux disease with esophagitis, without bleeding: Secondary | ICD-10-CM

## 2020-04-17 DIAGNOSIS — R1084 Generalized abdominal pain: Secondary | ICD-10-CM | POA: Diagnosis not present

## 2020-04-17 DIAGNOSIS — K5900 Constipation, unspecified: Secondary | ICD-10-CM

## 2020-04-17 DIAGNOSIS — R110 Nausea: Secondary | ICD-10-CM | POA: Diagnosis not present

## 2020-04-17 NOTE — Patient Instructions (Signed)
Your health issues we discussed today were:   Constipation with nausea and abdominal discomfort: 1. Continue using chocolate milk that is helped for you somewhat 2. You can use an over-the-counter laxative sparingly for constipation that gets worse 3. Call us if you have any worsening or severe symptoms  GERD (reflux/heartburn): 1. I am glad your GERD symptoms continue to be well managed on your acid blocker 2. Let us know if you have any worsening or severe symptoms  Overall I recommend:  1. Continue your other current medications 2. Return for follow-up in 6 months where we can identify what insurance you have at that time and try to get your prescriptions paid for 3. Call us for any questions or concerns   ---------------------------------------------------------------  I am glad you have gotten your COVID-19 vaccination!  Even though you are fully vaccinated you should continue to follow CDC and state/local guidelines.  ---------------------------------------------------------------   At [facility] we value your feedback. You may receive a survey about your visit today. Please share your experience as we strive to create trusting relationships with our patients to provide genuine, compassionate, quality care.  We appreciate your understanding and patience as we review any laboratory studies, imaging, and other diagnostic tests that are ordered as we care for you. Our office policy is 5 business days for review of these results, and any emergent or urgent results are addressed in a timely manner for your best interest. If you do not hear from our office in 1 week, please contact us.   We also encourage the use of MyChart, which contains your medical information for your review as well. If you are not enrolled in this feature, an access code is on this after visit summary for your convenience. Thank you for allowing [place] to be involved in your care.  It was great to see  you today!  I hope you have a great rest of your summer!!

## 2020-04-17 NOTE — Telephone Encounter (Signed)
Headache diary dropped off for Dr. Lars Mage review. Placed in providers box in office.

## 2020-04-17 NOTE — Progress Notes (Signed)
Referring Provider: [name], MD Primary Care Physician:  [name], MD Primary GI:  Dr. Abbey Chatters  Chief Complaint  Patient presents with  . Abdominal Pain    x 1 month, lower abd and radiates into left side  . Diarrhea    not having now  . Bloated    HPI:   [name] is a 74 y.o. female who presents for follow-up on abdominal pain and diarrhea.  The patient was last seen in our office 01/16/2020 for GERD, IBS constipation, history of colon polyps.  Noted history of IBS, GERD.  EGD up-to-date and recommended PPI due to chronic gastritis, history of diverticulitis, constipation with trial and failure of multiple medications.  Colonoscopy up-to-date 2016 recommended 5-year repeat in 2021.  At her last visit she was doing okay overall, Linzess is too expensive (tier 3 with co-pay of $125 per month).  Previously tried and failed Colace with and without MiraLAX, lactulose (bloating, inconsistent), Amitiza, multiple over-the-counter options.  Has not tried Trulance at that time.  No chronic pain medications.  Bowel movement twice a week with straining and hard stools and associated abdominal pain, bloating, nausea.  No other overt GI complaints.  GERD adequately managed on Protonix at that time.  Recommended trial of Trulance with a prescription sent to pharmacy and requested progress report.  If Linzess is the only effective option we can try to appeal for tier exception.  Recommended colonoscopy, continue other medications, follow-up in 3 months.  She called our office indicating Trulance would cost $1000.  Indicated she will try Colace 100 mg twice daily, MiraLAX once a day to twice a day depending on response, fiber supplement if first 2 options not effective, will call our office in 2 weeks with a progress report.  Colonoscopy completed 02/15/2020 which found a single 4 mm polyp in the cecum, diverticulosis in the entire examined colon, nonbleeding internal hemorrhoids,  otherwise normal.  Surgical pathology found the polyp to be tubular adenoma.  Recommended no future colonoscopy unless new symptoms develop.  No further contact from the patient until office visit.  Today she states she is doing okay overall. Constipation not doing good. She has received letter from the insurance who says they will not cover her constipation medications. She has been trying to eat a lot of fruits and vegetables. She isn't taking anything for constipation, colace and MiraLAX hasn't helped much at all. She is having a bowel movement about every other days with straining and hard stools. She is going to try and find a new insurance soon. She has abdominal discomfort with her constipation as well as nausea. Denies hematochezia, melena, fever, chills, unintentional weight loss. Denies URI or flu-like symptoms. Denies loss of sense of taste or smell. The patient has received COVID-19 vaccination(s). Denies chest pain, dyspnea, dizziness, lightheadedness, syncope, near syncope. Denies any other upper or lower GI symptoms.  GERD doing well on Protonix.  Past Medical History:  Diagnosis Date  . Blood clot of artery under arm ([place])   . Cyst on liver  . Depression   . Diabetes mellitus    controlled by diet  . Diverticulitis   . DVT of axillary vein, acute right ([place]) 11/24/2012   Dx on Jan 28, 2012.    . Fibrocystic breast disease   . Hepatic cyst   . Hypertension   . IBS (irritable bowel syndrome)   . Impaired glucose tolerance   . Menopause  Past Surgical History:  Procedure Laterality Date  . ABDOMINAL HYSTERECTOMY    . BACK SURGERY    . BREAST SURGERY  right   cyst removed  . CAST APPLICATION  6/64/4034   Procedure: CAST APPLICATION;  Surgeon: Carole Civil, MD;  Location: [facility];  Service: Orthopedics;  Laterality: Right;  procedure room   . CHOLECYSTECTOMY    . COLONOSCOPY  12/04/2004   NUR:Few small diverticula at sigmoid colon/Small cecal polyp ablated   .  COLONOSCOPY N/A 09/24/2014   SLF: small internal hemorrhoids  . COLONOSCOPY N/A 02/15/2020   Procedure: COLONOSCOPY;  Surgeon: [name], MD;  Location: [facility];  Service: Endoscopy;  Laterality: N/A;  8:30am  . ESOPHAGOGASTRODUODENOSCOPY N/A 02/02/2016   Procedure: ESOPHAGOGASTRODUODENOSCOPY (EGD);  Surgeon: Danie Binder, MD;  Location: [facility];  Service: Endoscopy;  Laterality: N/A;  930   . KNEE ARTHROCENTESIS  left  . POLYPECTOMY  02/15/2020   Procedure: POLYPECTOMY;  Surgeon: [name], MD;  Location: [facility];  Service: Endoscopy;;  . TUBAL LIGATION      Current Outpatient Medications  Medication Sig Dispense Refill  . acarbose (PRECOSE) 25 MG tablet Take 1 tablet (25 mg total) by mouth daily. 90 tablet 0  . acetaminophen (TYLENOL) 500 MG tablet Take 1,000 mg by mouth every 6 (six) hours as needed for mild pain, moderate pain or headache.     . chlorthalidone (HYGROTON) 25 MG tablet Take 1 tablet (25 mg total) by mouth daily. 90 tablet 3  . clopidogrel (PLAVIX) 75 MG tablet Take 1 tablet (75 mg total) by mouth daily. (Patient taking differently: Take 75 mg by mouth at bedtime. ) 90 tablet 3  . gabapentin (NEURONTIN) 100 MG capsule Take 1 capsule (100 mg total) by mouth at bedtime. 90 capsule 1  . HYDROcodone-acetaminophen (NORCO/VICODIN) 5-325 MG tablet Take 1 tablet by mouth every 6 (six) hours as needed for moderate pain.     [name] losartan (COZAAR) 50 MG tablet Take one half tablet daily (Patient taking differently: Take 50 mg by mouth daily. ) 90 tablet 1  . Multiple Vitamins-Minerals (MULTIVITAMIN WITH MINERALS) tablet Take 1 tablet by mouth daily.    . pantoprazole (PROTONIX) 40 MG tablet Take 1 tablet (40 mg total) by mouth daily. 90 tablet 1  . pravastatin (PRAVACHOL) 20 MG tablet Take 1 tablet (20 mg total) by mouth at bedtime. 90 tablet 1   No current facility-administered medications for this visit.    Allergies as of 04/17/2020 - Review Complete  04/17/2020  Allergen Reaction Noted  . Norvasc [amlodipine besylate] Other (See Comments) 11/21/2012  . Penicillins Hives and Other (See Comments)   . Advair diskus [fluticasone-salmeterol] Palpitations 11/21/2012  . Metformin and related Other (See Comments) 09/28/2013    Family History  Problem Relation Age of Onset  . Stroke Mother   . Heart attack Mother   . Cancer Sister   . Diabetes Sister   . Seizures Brother   . Diabetes Brother   . Heart disease Brother   . Kidney disease Son   . Colon cancer Neg Hx     Social History   Socioeconomic History  . Marital status: Widowed    Spouse name: Not on file  . Number of children: Not on file  . Years of education: Not on file  . Highest education level: Not on file  Occupational History  . Occupation: Retired  Tobacco Use  . Smoking status: Never Smoker  .  Smokeless tobacco: Never Used  . Tobacco comment: Never smoked  Vaping Use  . Vaping Use: Never used  Substance and Sexual Activity  . Alcohol use: No    Alcohol/week: 0.0 standard drinks  . Drug use: No  . Sexual activity: Yes  Other Topics Concern  . Not on file  Social History Narrative  . Not on file   Social Determinants of Health   Financial Resource Strain:   . Difficulty of Paying Living Expenses: Not on file  Food Insecurity:   . Worried About Charity fundraiser in the Last Year: Not on file  . Ran Out of Food in the Last Year: Not on file  Transportation Needs:   . Lack of Transportation (Medical): Not on file  . Lack of Transportation (Non-Medical): Not on file  Physical Activity:   . Days of Exercise per Week: Not on file  . Minutes of Exercise per Session: Not on file  Stress:   . Feeling of Stress : Not on file  Social Connections:   . Frequency of Communication with Friends and Family: Not on file  . Frequency of Social Gatherings with Friends and Family: Not on file  . Attends Religious Services: Not on file  . Active Member of Clubs  or Organizations: Not on file  . Attends Archivist Meetings: Not on file  . Marital Status: Not on file    Subjective: Review of Systems  Constitutional: Negative for chills, fever, malaise/fatigue and weight loss.  HENT: Negative for congestion and sore throat.   Respiratory: Negative for cough and shortness of breath.   Cardiovascular: Negative for chest pain and palpitations.  Gastrointestinal: Positive for abdominal pain, constipation and nausea. Negative for blood in stool, diarrhea, heartburn, melena and vomiting.  Musculoskeletal: Negative for joint pain and myalgias.  Skin: Negative for rash.  Neurological: Negative for dizziness and weakness.  Endo/Heme/Allergies: Does not bruise/bleed easily.  Psychiatric/Behavioral: Negative for depression. The patient is not nervous/anxious.   All other systems reviewed and are negative.    Objective: BP (!) 148/65   Pulse 67   Temp (!) 97.3 F (36.3 C)   Ht 5\' 4"  (1.626 m)   Wt 241 lb (109.3 kg)   BMI 41.37 kg/m  Physical Exam Vitals and nursing note reviewed.  Constitutional:      General: She is not in acute distress.    Appearance: Normal appearance. She is well-developed. She is obese. She is not ill-appearing, toxic-appearing or diaphoretic.  HENT:     Head: Normocephalic and atraumatic.     Nose: No congestion or rhinorrhea.  Eyes:     General: No scleral icterus. Cardiovascular:     Rate and Rhythm: Normal rate and regular rhythm.     Heart sounds: Normal heart sounds.  Pulmonary:     Effort: Pulmonary effort is normal. No respiratory distress.     Breath sounds: Normal breath sounds.  Abdominal:     General: Bowel sounds are normal.     Palpations: Abdomen is soft. There is no hepatomegaly, splenomegaly or mass.     Tenderness: There is no abdominal tenderness. There is no guarding or rebound.     Hernia: No hernia is present.  Skin:    General: Skin is warm and dry.     Coloration: Skin is not  jaundiced.     Findings: No rash.  Neurological:     General: No focal deficit present.     Mental Status: She  is alert and oriented to person, place, and time.  Psychiatric:        Attention and Perception: Attention normal.        Mood and Affect: Mood normal.        Speech: Speech normal.        Behavior: Behavior normal.        Thought Content: Thought content normal.        Cognition and Memory: Cognition and memory normal.      Assessment:  Very pleasant female who is struggled to control constipation.  We have tried multiple prescription options and unfortunately the medications that have worked for her her insurance would not cover and an adequate level.  She is considering changing insurances in the future to help with this.  She is still constipated, she uses chocolate milk which sometimes helps somewhat.  MiraLAX and Colace have not helped at all.  She has associated abdominal discomfort and nausea.  I have recommended that she continue her current medications, use an over-the-counter laxatives sparingly if needed for significant constipation.  I will have her follow-up in 6 months so that we can address her current insurance at that time and try to reorder medications that have worked for her.  She does have a history of GERD but this is well controlled on PPI.   Plan: 1. Continue current medications 2. Use an over-the-counter laxatives sparingly for recalcitrant constipation 3. Continue chocolate milk that helps move bowels 4. Continue good fiber intake as she has been 5. Follow-up in 6 months 6. Call for worsening symptoms.   Thank you for allowing [place] to participate in the care of Kathleen Ching, DNP, AGNP-C Adult & Gerontological Nurse Practitioner [facility]   04/17/2020 9:40 AM   Disclaimer: This note was dictated with voice recognition software. Similar sounding words can inadvertently be transcribed and may not  be corrected upon review.

## 2020-04-17 NOTE — Progress Notes (Signed)
Cc'ed to pcp °

## 2020-04-18 NOTE — Telephone Encounter (Signed)
Discussed with pt. Pt did want to go ahead with MRI. Order put in

## 2020-04-18 NOTE — Telephone Encounter (Signed)
Left message to return call 

## 2020-04-18 NOTE — Telephone Encounter (Signed)
I reviewed over patient's headache diary.  Patient has frequent severe headaches. Due to this I recommend MRI of the brain without contrast.  Please notify patient.  If she is fine with this please go ahead and initiate process.  If she is not then we can discuss this more when she follows up on the 24 Please keep follow-up visit

## 2020-04-22 ENCOUNTER — Other Ambulatory Visit: Payer: Self-pay

## 2020-04-22 ENCOUNTER — Encounter: Payer: Self-pay | Admitting: Family Medicine

## 2020-04-22 ENCOUNTER — Ambulatory Visit (INDEPENDENT_AMBULATORY_CARE_PROVIDER_SITE_OTHER): Payer: Medicare HMO | Admitting: Family Medicine

## 2020-04-22 VITALS — BP 122/84 | HR 72 | Temp 97.5°F | Ht 64.0 in | Wt 241.0 lb

## 2020-04-22 DIAGNOSIS — E119 Type 2 diabetes mellitus without complications: Secondary | ICD-10-CM

## 2020-04-22 DIAGNOSIS — E1140 Type 2 diabetes mellitus with diabetic neuropathy, unspecified: Secondary | ICD-10-CM

## 2020-04-22 DIAGNOSIS — I10 Essential (primary) hypertension: Secondary | ICD-10-CM | POA: Diagnosis not present

## 2020-04-22 LAB — POCT GLUCOSE (DEVICE FOR HOME USE): Glucose Fasting, POC: 203 mg/dL — AB (ref 70–99)

## 2020-04-22 NOTE — Progress Notes (Signed)
   Subjective:    Patient ID: [name], female    DOB: [birth year], 74 y.o.   MRN: [id number]  HPIfollow up on headaches. Pt states they are some better. Taking tylenol for headaches.   Feeling lightheaded since yesterday and asked to have her blood sugar checked at office because she could not find her machine.  Results for orders placed or performed in visit on 04/22/20  POCT Glucose (Device for Home Use)  Result Value Ref Range   Glucose Fasting, POC 203 (A) 70 - 99 mg/dL   POC Glucose     Patient with recent bout with headaches under a lot of stress.  Relates the headaches are very frequent.  They occur several times per week described throbbing top of the head does not think her blood pressure is elevated no vomiting or blurred vision and double vision has MRI coming up   Review of Systems  Constitutional: Negative for activity change, appetite change and fatigue.  HENT: Negative for congestion and rhinorrhea.   Respiratory: Negative for cough and shortness of breath.   Cardiovascular: Negative for chest pain and leg swelling.  Gastrointestinal: Negative for abdominal pain and diarrhea.  Endocrine: Negative for polydipsia and polyphagia.  Skin: Negative for color change.  Neurological: Positive for headaches. Negative for dizziness and weakness.  Psychiatric/Behavioral: Negative for behavioral problems and confusion.       Objective:   Physical Exam Vitals reviewed.  Constitutional:      General: She is not in acute distress. HENT:     Head: Normocephalic and atraumatic.  Eyes:     General:        Right eye: No discharge.        Left eye: No discharge.  Neck:     Trachea: No tracheal deviation.  Cardiovascular:     Rate and Rhythm: Normal rate and regular rhythm.     Heart sounds: Normal heart sounds. No murmur heard.   Pulmonary:     Effort: Pulmonary effort is normal. No respiratory distress.     Breath sounds: Normal breath sounds.   Lymphadenopathy:     Cervical: No cervical adenopathy.  Skin:    General: Skin is warm and dry.  Neurological:     Mental Status: She is alert.     Coordination: Coordination normal.  Psychiatric:        Behavior: Behavior normal.           Assessment & Plan:  1. Diabetes mellitus without complication ([place]) Patient due for A1c this was ordered - POCT Glucose (Device for Home Use) - Hemoglobin A1c  2. Essential hypertension HTN- patient seen for follow-up regarding HTN.  Diet, medication compliance, appropriate labs and refills were completed.  Importance of keeping blood pressure under good control to lessen the risk of complications discussed  - Basic metabolic panel  3. Type 2 diabetes mellitus with diabetic neuropathy, without long-term current use of insulin ([place]) The patient was seen today as part of a comprehensive visit for diabetes. The importance of keeping her A1c at or below 7 was discussed.  Discussed diet, activity, and medication compliance Standard follow-up visit recommended.  Patient aware lack of control and follow-up increases risk of diabetic complications.  Finally discussion with the patient regarding healthy eating regular physical activity and minimizing frequent use of Tylenol for her headaches MRI ordered await the results

## 2020-04-23 LAB — BASIC METABOLIC PANEL
BUN/Creatinine Ratio: 17 (ref 12–28)
BUN: 15 mg/dL (ref 8–27)
CO2: 29 mmol/L (ref 20–29)
Calcium: 10.2 mg/dL (ref 8.7–10.3)
Chloride: 98 mmol/L (ref 96–106)
Creatinine, Ser: 0.89 mg/dL (ref 0.57–1.00)
GFR calc Af Amer: 75 mL/min/{1.73_m2} (ref >59)
GFR calc non Af Amer: 65 mL/min/{1.73_m2} (ref >59)
Glucose: 202 mg/dL — ABNORMAL HIGH (ref 65–99)
Potassium: 4.1 mmol/L (ref 3.5–5.2)
Sodium: 139 mmol/L (ref 134–144)

## 2020-04-23 LAB — HEMOGLOBIN A1C
Est. average glucose Bld gHb Est-mCnc: 212 mg/dL
Hgb A1c MFr Bld: 9 % — ABNORMAL HIGH (ref 4.8–5.6)

## 2020-04-24 ENCOUNTER — Other Ambulatory Visit: Payer: Self-pay | Admitting: *Deleted

## 2020-04-24 ENCOUNTER — Telehealth: Payer: Self-pay | Admitting: Family Medicine

## 2020-04-24 MED ORDER — GLIPIZIDE ER 2.5 MG PO TB24: 2.5000 mg | ORAL_TABLET | Freq: Every day | ORAL | 0 refills | Status: DC

## 2020-04-24 NOTE — Telephone Encounter (Signed)
Pt is calling back she was in the office Tuesday and her sugar is still in the 200"s this morning is 204 and she don't know what to do to get it down.  Pt call back [phone]

## 2020-04-24 NOTE — Telephone Encounter (Signed)
Patient notified of results and verbalized understanding. She has scheduled an appointment and new medication sent to pharmacy.

## 2020-04-24 NOTE — Telephone Encounter (Signed)
I would recommend glipizide XL 2.5 mg 1 daily, #30 Please see result note May stop acrabose I recommend patient to check her sugar twice daily Set up phone visit with me next week

## 2020-04-24 NOTE — Telephone Encounter (Signed)
Patient currently on Precose 25mg  one tab daily for her sugar

## 2020-04-25 ENCOUNTER — Telehealth: Payer: Self-pay | Admitting: Family Medicine

## 2020-04-25 NOTE — Telephone Encounter (Signed)
[name] called and has [name] on in September wants Dr Nicki Reaper to write a letter to get her out of [name]   Pt call back [phone]

## 2020-04-28 NOTE — Telephone Encounter (Signed)
Front Please touch base with patient.  Ask her to clarify the reason she cannot do jury duty so we can do our best to write her a letter.  Then send this message back to me with her answer.

## 2020-04-29 ENCOUNTER — Telehealth: Payer: Self-pay | Admitting: Family Medicine

## 2020-04-29 DIAGNOSIS — E119 Type 2 diabetes mellitus without complications: Secondary | ICD-10-CM

## 2020-04-29 NOTE — Telephone Encounter (Signed)
Patient has office visit scheduled 05/14/20 for follow up and had labs drawn last week for office visit

## 2020-04-29 NOTE — Telephone Encounter (Signed)
For the next 7 days have the patient take 2 of the glipizide tablets daily (she was prescribed 2.5 mg glipizide XL just recently) Then the patient can call us back early next week with how her numbers are The goal is to see morning sugars between 100-140 And later in the day less than 160  We do not want any low blood sugars.  If her blood sugar runs in the 60s or 70s she should go back to 1 tablet a day and notify us Please give [place] an update by early next week Healthy diet recommended Keep follow-up visit mid September

## 2020-04-29 NOTE — Telephone Encounter (Signed)
Pt is having sugar running high 167 unable to get down 175, 193, 196 , 188 are the numbers that she has noted. Pt is needing to know what to do to get this number down.   Pt call back [phone]

## 2020-04-29 NOTE — Telephone Encounter (Signed)
Patient advised per Dr Nicki Reaper: For the next 7 days have the patient take 2 of the glipizide tablets daily (she was prescribed 2.5 mg glipizide XL just recently) Then the patient can call us back early next week with how her numbers are The goal is to see morning sugars between 100-140 And later in the day less than 160   We do not want any low blood sugars.  If her blood sugar runs in the 60s or 70s she should go back to 1 tablet a day and notify us Please give [place] an update by early next week Healthy diet recommended Keep follow-up visit mid September  Patient verbalized understanding and stated she would like a referral to see the dietician [facility] [place]

## 2020-04-29 NOTE — Telephone Encounter (Signed)
May have the referral

## 2020-04-30 NOTE — Addendum Note (Signed)
Addended by: Dairl Ponder on: 04/30/2020 11:38 AM   Modules accepted: Orders

## 2020-04-30 NOTE — Telephone Encounter (Signed)
I spoke with [name] today and she said that she is having to go to the restroom every 15 min. Also having headache, blood pressure. [name] said she has a letter that states that if she is 52 years or older and have health conditions that she will need to provide a letter to the courts for not being on [employer].

## 2020-04-30 NOTE — Telephone Encounter (Signed)
Referral ordered in Epic as requested. Patient aware.

## 2020-05-01 ENCOUNTER — Encounter: Payer: Self-pay | Admitting: Family Medicine

## 2020-05-01 NOTE — Telephone Encounter (Signed)
Mailed out to patient today

## 2020-05-01 NOTE — Telephone Encounter (Signed)
Letter for jury duty was written

## 2020-05-06 ENCOUNTER — Telehealth: Payer: Self-pay | Admitting: Family Medicine

## 2020-05-06 NOTE — Telephone Encounter (Signed)
Pt states she is having trouble checking her blood sugar. It is taking her like 6 or more tries each time. She is having trouble getting the blood to come out at her finger.

## 2020-05-06 NOTE — Telephone Encounter (Signed)
Discussed with pt

## 2020-05-06 NOTE — Telephone Encounter (Signed)
So at the very least just check it in the morning time once daily

## 2020-05-06 NOTE — Telephone Encounter (Signed)
Pt contacted. Pt states she has tried running warm water over fingers, holding hand down beside her and drinking more water. Pt states her fingertips are white from trying to check sugar today. Pt unable to obtain any blood from any finger today. Pt is suppose to check sugars BID. Pt wanting to know what she needs to do. Please advise. Thank you

## 2020-05-07 ENCOUNTER — Ambulatory Visit (HOSPITAL_COMMUNITY)
Admission: RE | Admit: 2020-05-07 | Discharge: 2020-05-07 | Disposition: A | Discharge status: Home / Self Care | Payer: Medicare HMO | Source: Ambulatory Visit | Attending: Family Medicine | Admitting: Family Medicine

## 2020-05-07 ENCOUNTER — Other Ambulatory Visit: Payer: Self-pay

## 2020-05-07 DIAGNOSIS — J0120 Acute ethmoidal sinusitis, unspecified: Secondary | ICD-10-CM | POA: Diagnosis not present

## 2020-05-07 DIAGNOSIS — J322 Chronic ethmoidal sinusitis: Secondary | ICD-10-CM | POA: Diagnosis not present

## 2020-05-07 DIAGNOSIS — R519 Headache, unspecified: Secondary | ICD-10-CM | POA: Diagnosis not present

## 2020-05-07 DIAGNOSIS — J3489 Other specified disorders of nose and nasal sinuses: Secondary | ICD-10-CM | POA: Diagnosis not present

## 2020-05-09 ENCOUNTER — Other Ambulatory Visit: Payer: Self-pay

## 2020-05-09 ENCOUNTER — Ambulatory Visit: Payer: Medicare HMO | Admitting: Podiatry

## 2020-05-09 ENCOUNTER — Ambulatory Visit (INDEPENDENT_AMBULATORY_CARE_PROVIDER_SITE_OTHER): Payer: Medicare HMO | Admitting: Podiatry

## 2020-05-09 DIAGNOSIS — M25372 Other instability, left ankle: Secondary | ICD-10-CM

## 2020-05-09 DIAGNOSIS — M778 Other enthesopathies, not elsewhere classified: Secondary | ICD-10-CM

## 2020-05-09 DIAGNOSIS — M779 Enthesopathy, unspecified: Secondary | ICD-10-CM

## 2020-05-12 NOTE — Progress Notes (Signed)
Subjective:   Patient ID: [name], female   DOB: 74 y.o.   MRN: [id number]   HPI Patient presents stating I seem to be feeling better but I do have some discomfort in my ankle joint that really been bothering you.  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  At this point I did do sterile prep and I injected the sinus tarsi left 3 mg Kenalog 5 mg Xylocaine and I then went ahead and I discussed strengthening the lateral ankle ligaments the utilization of compression stocking with significant reduction of the Tri-Lock ankle brace.  Patient will be seen back to recheck as symptoms indicate

## 2020-05-14 ENCOUNTER — Ambulatory Visit (INDEPENDENT_AMBULATORY_CARE_PROVIDER_SITE_OTHER): Payer: Medicare HMO | Admitting: Family Medicine

## 2020-05-14 ENCOUNTER — Other Ambulatory Visit: Payer: Self-pay

## 2020-05-14 ENCOUNTER — Encounter: Payer: Self-pay | Admitting: Family Medicine

## 2020-05-14 VITALS — BP 132/72 | HR 64 | Temp 97.1°F | Wt 235.0 lb

## 2020-05-14 DIAGNOSIS — E119 Type 2 diabetes mellitus without complications: Secondary | ICD-10-CM

## 2020-05-14 DIAGNOSIS — I10 Essential (primary) hypertension: Secondary | ICD-10-CM

## 2020-05-14 DIAGNOSIS — M5481 Occipital neuralgia: Secondary | ICD-10-CM

## 2020-05-14 MED ORDER — GLIPIZIDE ER 2.5 MG PO TB24
2.5000 mg | ORAL_TABLET | Freq: Every day | ORAL | 3 refills | Status: DC
Start: 2020-05-14 — End: 2020-05-19

## 2020-05-14 MED ORDER — GLIPIZIDE ER 2.5 MG PO TB24
2.5000 mg | ORAL_TABLET | Freq: Every day | ORAL | 1 refills | Status: DC
Start: 2020-05-14 — End: 2020-05-14

## 2020-05-14 MED ORDER — INDOMETHACIN 25 MG PO CAPS: ORAL_CAPSULE | ORAL | 0 refills | Status: DC

## 2020-05-14 NOTE — Patient Instructions (Addendum)
DASH Eating Plan DASH stands for "Dietary Approaches to Stop Hypertension." The DASH eating plan is a healthy eating plan that has been shown to reduce high blood pressure (hypertension). It may also reduce your risk for type 2 diabetes, heart disease, and stroke. The DASH eating plan may also help with weight loss. What are tips for following this plan?  General guidelines  Avoid eating more than 2,300 mg (milligrams) of salt (sodium) a day. If you have hypertension, you may need to reduce your sodium intake to 1,500 mg a day.  Limit alcohol intake to no more than 1 drink a day for nonpregnant women and 2 drinks a day for men. One drink equals 12 oz of beer, 5 oz of wine, or 1 oz of hard liquor.  Work with your health care provider to maintain a healthy body weight or to lose weight. Ask what an ideal weight is for you.  Get at least 30 minutes of exercise that causes your heart to beat faster (aerobic exercise) most days of the week. Activities may include walking, swimming, or biking.  Work with your health care provider or diet and nutrition specialist (dietitian) to adjust your eating plan to your individual calorie needs. Reading food labels   Check food labels for the amount of sodium per serving. Choose foods with less than 5 percent of the Daily Value of sodium. Generally, foods with less than 300 mg of sodium per serving fit into this eating plan.  To find whole grains, look for the word "whole" as the first word in the ingredient list. Shopping  Buy products labeled as "low-sodium" or "no salt added."  Buy fresh foods. Avoid canned foods and premade or frozen meals. Cooking  Avoid adding salt when cooking. Use salt-free seasonings or herbs instead of table salt or sea salt. Check with your health care provider or pharmacist before using salt substitutes.  Do not fry foods. Cook foods using healthy methods such as baking, boiling, grilling, and broiling instead.  Cook with  heart-healthy oils, such as olive, canola, soybean, or sunflower oil. Meal planning  Eat a balanced diet that includes: ? 5 or more servings of fruits and vegetables each day. At each meal, try to fill half of your plate with fruits and vegetables. ? Up to 6-8 servings of whole grains each day. ? Less than 6 oz of lean meat, poultry, or fish each day. A 3-oz serving of meat is about the same size as a deck of cards. One egg equals 1 oz. ? 2 servings of low-fat dairy each day. ? A serving of nuts, seeds, or beans 5 times each week. ? Heart-healthy fats. Healthy fats called Omega-3 fatty acids are found in foods such as flaxseeds and coldwater fish, like sardines, salmon, and mackerel.  Limit how much you eat of the following: ? Canned or prepackaged foods. ? Food that is high in trans fat, such as fried foods. ? Food that is high in saturated fat, such as fatty meat. ? Sweets, desserts, sugary drinks, and other foods with added sugar. ? Full-fat dairy products.  Do not salt foods before eating.  Try to eat at least 2 vegetarian meals each week.  Eat more home-cooked food and less restaurant, buffet, and fast food.  When eating at a restaurant, ask that your food be prepared with less salt or no salt, if possible. What foods are recommended? The items listed may not be a complete list. Talk with your dietitian about   what dietary choices are best for you. Grains Whole-grain or whole-wheat bread. Whole-grain or whole-wheat pasta. Brown rice. Oatmeal. Quinoa. Bulgur. Whole-grain and low-sodium cereals. Pita bread. Low-fat, low-sodium crackers. Whole-wheat flour tortillas. Vegetables Fresh or frozen vegetables (raw, steamed, roasted, or grilled). Low-sodium or reduced-sodium tomato and vegetable juice. Low-sodium or reduced-sodium tomato sauce and tomato paste. Low-sodium or reduced-sodium canned vegetables. Fruits All fresh, dried, or frozen fruit. Canned fruit in natural juice (without  added sugar). Meat and other protein foods Skinless chicken or turkey. Ground chicken or turkey. Pork with fat trimmed off. Fish and seafood. Egg whites. Dried beans, peas, or lentils. Unsalted nuts, nut butters, and seeds. Unsalted canned beans. Lean cuts of beef with fat trimmed off. Low-sodium, lean deli meat. Dairy Low-fat (1%) or fat-free (skim) milk. Fat-free, low-fat, or reduced-fat cheeses. Nonfat, low-sodium ricotta or cottage cheese. Low-fat or nonfat yogurt. Low-fat, low-sodium cheese. Fats and oils Soft margarine without trans fats. Vegetable oil. Low-fat, reduced-fat, or light mayonnaise and salad dressings (reduced-sodium). Canola, safflower, olive, soybean, and sunflower oils. Avocado. Seasoning and other foods Herbs. Spices. Seasoning mixes without salt. Unsalted popcorn and pretzels. Fat-free sweets. What foods are not recommended? The items listed may not be a complete list. Talk with your dietitian about what dietary choices are best for you. Grains Baked goods made with fat, such as croissants, muffins, or some breads. Dry pasta or rice meal packs. Vegetables Creamed or fried vegetables. Vegetables in a cheese sauce. Regular canned vegetables (not low-sodium or reduced-sodium). Regular canned tomato sauce and paste (not low-sodium or reduced-sodium). Regular tomato and vegetable juice (not low-sodium or reduced-sodium). Pickles. Olives. Fruits Canned fruit in a light or heavy syrup. Fried fruit. Fruit in cream or butter sauce. Meat and other protein foods Fatty cuts of meat. Ribs. Fried meat. Bacon. Sausage. Bologna and other processed lunch meats. Salami. Fatback. Hotdogs. Bratwurst. Salted nuts and seeds. Canned beans with added salt. Canned or smoked fish. Whole eggs or egg yolks. Chicken or turkey with skin. Dairy Whole or 2% milk, cream, and half-and-half. Whole or full-fat cream cheese. Whole-fat or sweetened yogurt. Full-fat cheese. Nondairy creamers. Whipped toppings.  Processed cheese and cheese spreads. Fats and oils Butter. Stick margarine. Lard. Shortening. Ghee. Bacon fat. Tropical oils, such as coconut, palm kernel, or palm oil. Seasoning and other foods Salted popcorn and pretzels. Onion salt, garlic salt, seasoned salt, table salt, and sea salt. Worcestershire sauce. Tartar sauce. Barbecue sauce. Teriyaki sauce. Soy sauce, including reduced-sodium. Steak sauce. Canned and packaged gravies. Fish sauce. Oyster sauce. Cocktail sauce. Horseradish that you find on the shelf. Ketchup. Mustard. Meat flavorings and tenderizers. Bouillon cubes. Hot sauce and Tabasco sauce. Premade or packaged marinades. Premade or packaged taco seasonings. Relishes. Regular salad dressings. Where to find more information:  National Heart, Lung, and Blood Institute: www.nhlbi.nih.gov  American Heart Association: www.heart.org Summary  The DASH eating plan is a healthy eating plan that has been shown to reduce high blood pressure (hypertension). It may also reduce your risk for type 2 diabetes, heart disease, and stroke.  With the DASH eating plan, you should limit salt (sodium) intake to 2,300 mg a day. If you have hypertension, you may need to reduce your sodium intake to 1,500 mg a day.  When on the DASH eating plan, aim to eat more fresh fruits and vegetables, whole grains, lean proteins, low-fat dairy, and heart-healthy fats.  Work with your health care provider or diet and nutrition specialist (dietitian) to adjust your eating plan to your   individual calorie needs. This information is not intended to replace advice given to you by your health care provider. Make sure you discuss any questions you have with your health care provider. Document Revised: 07/29/2017 Document Reviewed: 08/09/2016 Elsevier Patient Education  2020 [employer]   Diabetes Mellitus and Nutrition, Adult When you have diabetes (diabetes mellitus), it is very important to have healthy eating  habits because your blood sugar (glucose) levels are greatly affected by what you eat and drink. Eating healthy foods in the appropriate amounts, at about the same times every day, can help you:  Control your blood glucose.  Lower your risk of heart disease.  Improve your blood pressure.  Reach or maintain a healthy weight. Every person with diabetes is different, and each person has different needs for a meal plan. Your health care provider may recommend that you work with a diet and nutrition specialist (dietitian) to make a meal plan that is best for you. Your meal plan may vary depending on factors such as:  The calories you need.  The medicines you take.  Your weight.  Your blood glucose, blood pressure, and cholesterol levels.  Your activity level.  Other health conditions you have, such as heart or kidney disease. How do carbohydrates affect me? Carbohydrates, also called carbs, affect your blood glucose level more than any other type of food. Eating carbs naturally raises the amount of glucose in your blood. Carb counting is a method for keeping track of how many carbs you eat. Counting carbs is important to keep your blood glucose at a healthy level, especially if you use insulin or take certain oral diabetes medicines. It is important to know how many carbs you can safely have in each meal. This is different for every person. Your dietitian can help you calculate how many carbs you should have at each meal and for each snack. Foods that contain carbs include:  Bread, cereal, rice, pasta, and crackers.  Potatoes and corn.  Peas, beans, and lentils.  Milk and yogurt.  Fruit and juice.  Desserts, such as cakes, cookies, ice cream, and candy. How does alcohol affect me? Alcohol can cause a sudden decrease in blood glucose (hypoglycemia), especially if you use insulin or take certain oral diabetes medicines. Hypoglycemia can be a life-threatening condition. Symptoms of  hypoglycemia (sleepiness, dizziness, and confusion) are similar to symptoms of having too much alcohol. If your health care provider says that alcohol is safe for you, follow these guidelines:  Limit alcohol intake to no more than 1 drink per day for nonpregnant women and 2 drinks per day for men. One drink equals 12 oz of beer, 5 oz of wine, or 1 oz of hard liquor.  Do not drink on an empty stomach.  Keep yourself hydrated with water, diet soda, or unsweetened iced tea.  Keep in mind that regular soda, juice, and other mixers may contain a lot of sugar and must be counted as carbs. What are tips for following this plan?  Reading food labels  Start by checking the serving size on the "Nutrition Facts" label of packaged foods and drinks. The amount of calories, carbs, fats, and other nutrients listed on the label is based on one serving of the item. Many items contain more than one serving per package.  Check the total grams (g) of carbs in one serving. You can calculate the number of servings of carbs in one serving by dividing the total carbs by 15. For example, if   a food has 30 g of total carbs, it would be equal to 2 servings of carbs.  Check the number of grams (g) of saturated and trans fats in one serving. Choose foods that have low or no amount of these fats.  Check the number of milligrams (mg) of salt (sodium) in one serving. Most people should limit total sodium intake to less than 2,300 mg per day.  Always check the nutrition information of foods labeled as "low-fat" or "nonfat". These foods may be higher in added sugar or refined carbs and should be avoided.  Talk to your dietitian to identify your daily goals for nutrients listed on the label. Shopping  Avoid buying canned, premade, or processed foods. These foods tend to be high in fat, sodium, and added sugar.  Shop around the outside edge of the grocery store. This includes fresh fruits and vegetables, bulk grains, fresh  meats, and fresh dairy. Cooking  Use low-heat cooking methods, such as baking, instead of high-heat cooking methods like deep frying.  Cook using healthy oils, such as olive, canola, or sunflower oil.  Avoid cooking with butter, cream, or high-fat meats. Meal planning  Eat meals and snacks regularly, preferably at the same times every day. Avoid going long periods of time without eating.  Eat foods high in fiber, such as fresh fruits, vegetables, beans, and whole grains. Talk to your dietitian about how many servings of carbs you can eat at each meal.  Eat 4-6 ounces (oz) of lean protein each day, such as lean meat, chicken, fish, eggs, or tofu. One oz of lean protein is equal to: ? 1 oz of meat, chicken, or fish. ? 1 egg. ?  cup of tofu.  Eat some foods each day that contain healthy fats, such as avocado, nuts, seeds, and fish. Lifestyle  Check your blood glucose regularly.  Exercise regularly as told by your health care provider. This may include: ? 150 minutes of moderate-intensity or vigorous-intensity exercise each week. This could be brisk walking, biking, or water aerobics. ? Stretching and doing strength exercises, such as yoga or weightlifting, at least 2 times a week.  Take medicines as told by your health care provider.  Do not use any products that contain nicotine or tobacco, such as cigarettes and e-cigarettes. If you need help quitting, ask your health care provider.  Work with a counselor or diabetes educator to identify strategies to manage stress and any emotional and social challenges. Questions to ask a health care provider  Do I need to meet with a diabetes educator?  Do I need to meet with a dietitian?  What number can I call if I have questions?  When are the best times to check my blood glucose? Where to find more information:  American Diabetes Association: diabetes.org  Academy of Nutrition and Dietetics: www.eatright.org  National Institute  of Diabetes and Digestive and Kidney Diseases (NIH): www.niddk.nih.gov Summary  A healthy meal plan will help you control your blood glucose and maintain a healthy lifestyle.  Working with a diet and nutrition specialist (dietitian) can help you make a meal plan that is best for you.  Keep in mind that carbohydrates (carbs) and alcohol have immediate effects on your blood glucose levels. It is important to count carbs and to use alcohol carefully. This information is not intended to replace advice given to you by your health care provider. Make sure you discuss any questions you have with your health care provider. Document Revised: 07/29/2017 Document   09/20/2016 Elsevier Patient Education  [place].

## 2020-05-14 NOTE — Progress Notes (Signed)
   Subjective:    Patient ID: [name], female    DOB: 01/19/1946, 74 y.o.   MRN: [id number]  HPI Pt here for follow up. Pt states that taking Chlorthalidone and Glipizide together is causing her to have a pounding headache, feeling tired and weak. Pt has to lie down for about one hour after taking. Pt was unable to drive self today due to feeling weak.  Sugars have been up and down. Higher at night. Checks sugars BID. Pt sometime unable to get blood from finger.  Her numbers that she shows [place] overall look fairly good  Review of Systems  Constitutional: Negative for activity change and appetite change.  HENT: Negative for congestion and rhinorrhea.   Respiratory: Negative for cough and shortness of breath.   Cardiovascular: Negative for chest pain and leg swelling.  Gastrointestinal: Negative for abdominal pain, nausea and vomiting.  Skin: Negative for color change.  Neurological: Negative for dizziness and weakness.  Psychiatric/Behavioral: Negative for agitation and confusion.       Objective:   Physical Exam Vitals reviewed.  Constitutional:      General: She is not in acute distress. HENT:     Head: Normocephalic.  Cardiovascular:     Rate and Rhythm: Normal rate and regular rhythm.     Heart sounds: Normal heart sounds. No murmur heard.   Pulmonary:     Effort: Pulmonary effort is normal.     Breath sounds: Normal breath sounds.  Lymphadenopathy:     Cervical: No cervical adenopathy.  Neurological:     Mental Status: She is alert.  Psychiatric:        Behavior: Behavior normal.   There is tenderness in the back of the scalp and top of the scalp but not in the temporal region  I did check her blood pressure sitting and standing and she does have a drop in blood pressure with standing Patient with tenderness in the occipital region where the nerve comes out describes pain that goes across the back of her head toward the top I doubt that this is temporal  arteritis     Assessment & Plan:  HTN I believe she is having orthostatic hypotension related to the diuretic I recommend stopping the diuretic.  May continue losartan as is patient will follow up in 4 weeks to recheck blood pressure sitting and standing at that time  Glucose control is good continue current measures.  Recommend that the patient reduce the frequency of her glucose checks and send [place] readings  As for the headaches more than likely this is a inflammation of the occipital nerves she will try indomethacin over the course the next few days not for long-term if it does not help the headaches next step is referral to neurology for possible injections

## 2020-05-18 ENCOUNTER — Telehealth: Payer: Self-pay | Admitting: Family Medicine

## 2020-05-18 NOTE — Telephone Encounter (Signed)
I reviewed over her sugar readings. Her numbers look better. Continue current medication. Watch diet. She does not need to check her sugars twice daily She can cut down to just once daily sometimes she should check in the morning other times before supper or before bedtime. (Morning readings around 100-110 evening readings around 117-125) Keep follow-up in October

## 2020-05-18 NOTE — Telephone Encounter (Signed)
error 

## 2020-05-19 ENCOUNTER — Other Ambulatory Visit: Payer: Self-pay | Admitting: *Deleted

## 2020-05-19 MED ORDER — GLIPIZIDE ER 2.5 MG PO TB24
2.5000 mg | ORAL_TABLET | Freq: Every day | ORAL | 0 refills | Status: DC
Start: 2020-05-19 — End: 2020-07-04

## 2020-05-19 MED ORDER — GLIPIZIDE ER 2.5 MG PO TB24
2.5000 mg | ORAL_TABLET | Freq: Every day | ORAL | 3 refills | Status: DC
Start: 2020-05-19 — End: 2020-05-19

## 2020-05-19 NOTE — Telephone Encounter (Signed)
Patient notified and verbalized understanding. Rx sent to [facility].

## 2020-05-19 NOTE — Addendum Note (Signed)
Addended by: Patsy Lager on: 05/19/2020 09:30 AM   Modules accepted: Orders

## 2020-06-04 ENCOUNTER — Other Ambulatory Visit: Payer: Self-pay

## 2020-06-04 ENCOUNTER — Ambulatory Visit (INDEPENDENT_AMBULATORY_CARE_PROVIDER_SITE_OTHER): Payer: Medicare HMO | Admitting: Family Medicine

## 2020-06-04 ENCOUNTER — Encounter: Payer: Self-pay | Admitting: Family Medicine

## 2020-06-04 VITALS — BP 124/78 | Temp 98.5°F | Ht 64.0 in | Wt 234.4 lb

## 2020-06-04 DIAGNOSIS — E119 Type 2 diabetes mellitus without complications: Secondary | ICD-10-CM

## 2020-06-04 NOTE — Patient Instructions (Signed)
DASH Eating Plan DASH stands for "Dietary Approaches to Stop Hypertension." The DASH eating plan is a healthy eating plan that has been shown to reduce high blood pressure (hypertension). It may also reduce your risk for type 2 diabetes, heart disease, and stroke. The DASH eating plan may also help with weight loss. What are tips for following this plan?  General guidelines  Avoid eating more than 2,300 mg (milligrams) of salt (sodium) a day. If you have hypertension, you may need to reduce your sodium intake to 1,500 mg a day.  Limit alcohol intake to no more than 1 drink a day for nonpregnant women and 2 drinks a day for men. One drink equals 12 oz of beer, 5 oz of wine, or 1 oz of hard liquor.  Work with your health care provider to maintain a healthy body weight or to lose weight. Ask what an ideal weight is for you.  Get at least 30 minutes of exercise that causes your heart to beat faster (aerobic exercise) most days of the week. Activities may include walking, swimming, or biking.  Work with your health care provider or diet and nutrition specialist (dietitian) to adjust your eating plan to your individual calorie needs. Reading food labels   Check food labels for the amount of sodium per serving. Choose foods with less than 5 percent of the Daily Value of sodium. Generally, foods with less than 300 mg of sodium per serving fit into this eating plan.  To find whole grains, look for the word "whole" as the first word in the ingredient list. Shopping  Buy products labeled as "low-sodium" or "no salt added."  Buy fresh foods. Avoid canned foods and premade or frozen meals. Cooking  Avoid adding salt when cooking. Use salt-free seasonings or herbs instead of table salt or sea salt. Check with your health care provider or pharmacist before using salt substitutes.  Do not fry foods. Cook foods using healthy methods such as baking, boiling, grilling, and broiling instead.  Cook with  heart-healthy oils, such as olive, canola, soybean, or sunflower oil. Meal planning  Eat a balanced diet that includes: ? 5 or more servings of fruits and vegetables each day. At each meal, try to fill half of your plate with fruits and vegetables. ? Up to 6-8 servings of whole grains each day. ? Less than 6 oz of lean meat, poultry, or fish each day. A 3-oz serving of meat is about the same size as a deck of cards. One egg equals 1 oz. ? 2 servings of low-fat dairy each day. ? A serving of nuts, seeds, or beans 5 times each week. ? Heart-healthy fats. Healthy fats called Omega-3 fatty acids are found in foods such as flaxseeds and coldwater fish, like sardines, salmon, and mackerel.  Limit how much you eat of the following: ? Canned or prepackaged foods. ? Food that is high in trans fat, such as fried foods. ? Food that is high in saturated fat, such as fatty meat. ? Sweets, desserts, sugary drinks, and other foods with added sugar. ? Full-fat dairy products.  Do not salt foods before eating.  Try to eat at least 2 vegetarian meals each week.  Eat more home-cooked food and less restaurant, buffet, and fast food.  When eating at a restaurant, ask that your food be prepared with less salt or no salt, if possible. What foods are recommended? The items listed may not be a complete list. Talk with your dietitian about   what dietary choices are best for you. Grains Whole-grain or whole-wheat bread. Whole-grain or whole-wheat pasta. Brown rice. Oatmeal. Quinoa. Bulgur. Whole-grain and low-sodium cereals. Pita bread. Low-fat, low-sodium crackers. Whole-wheat flour tortillas. Vegetables Fresh or frozen vegetables (raw, steamed, roasted, or grilled). Low-sodium or reduced-sodium tomato and vegetable juice. Low-sodium or reduced-sodium tomato sauce and tomato paste. Low-sodium or reduced-sodium canned vegetables. Fruits All fresh, dried, or frozen fruit. Canned fruit in natural juice (without  added sugar). Meat and other protein foods Skinless chicken or turkey. Ground chicken or turkey. Pork with fat trimmed off. Fish and seafood. Egg whites. Dried beans, peas, or lentils. Unsalted nuts, nut butters, and seeds. Unsalted canned beans. Lean cuts of beef with fat trimmed off. Low-sodium, lean deli meat. Dairy Low-fat (1%) or fat-free (skim) milk. Fat-free, low-fat, or reduced-fat cheeses. Nonfat, low-sodium ricotta or cottage cheese. Low-fat or nonfat yogurt. Low-fat, low-sodium cheese. Fats and oils Soft margarine without trans fats. Vegetable oil. Low-fat, reduced-fat, or light mayonnaise and salad dressings (reduced-sodium). Canola, safflower, olive, soybean, and sunflower oils. Avocado. Seasoning and other foods Herbs. Spices. Seasoning mixes without salt. Unsalted popcorn and pretzels. Fat-free sweets. What foods are not recommended? The items listed may not be a complete list. Talk with your dietitian about what dietary choices are best for you. Grains Baked goods made with fat, such as croissants, muffins, or some breads. Dry pasta or rice meal packs. Vegetables Creamed or fried vegetables. Vegetables in a cheese sauce. Regular canned vegetables (not low-sodium or reduced-sodium). Regular canned tomato sauce and paste (not low-sodium or reduced-sodium). Regular tomato and vegetable juice (not low-sodium or reduced-sodium). Pickles. Olives. Fruits Canned fruit in a light or heavy syrup. Fried fruit. Fruit in cream or butter sauce. Meat and other protein foods Fatty cuts of meat. Ribs. Fried meat. Bacon. Sausage. Bologna and other processed lunch meats. Salami. Fatback. Hotdogs. Bratwurst. Salted nuts and seeds. Canned beans with added salt. Canned or smoked fish. Whole eggs or egg yolks. Chicken or turkey with skin. Dairy Whole or 2% milk, cream, and half-and-half. Whole or full-fat cream cheese. Whole-fat or sweetened yogurt. Full-fat cheese. Nondairy creamers. Whipped toppings.  Processed cheese and cheese spreads. Fats and oils Butter. Stick margarine. Lard. Shortening. Ghee. Bacon fat. Tropical oils, such as coconut, palm kernel, or palm oil. Seasoning and other foods Salted popcorn and pretzels. Onion salt, garlic salt, seasoned salt, table salt, and sea salt. Worcestershire sauce. Tartar sauce. Barbecue sauce. Teriyaki sauce. Soy sauce, including reduced-sodium. Steak sauce. Canned and packaged gravies. Fish sauce. Oyster sauce. Cocktail sauce. Horseradish that you find on the shelf. Ketchup. Mustard. Meat flavorings and tenderizers. Bouillon cubes. Hot sauce and Tabasco sauce. Premade or packaged marinades. Premade or packaged taco seasonings. Relishes. Regular salad dressings. Where to find more information:  National Heart, Lung, and Blood Institute: www.nhlbi.nih.gov  American Heart Association: www.heart.org Summary  The DASH eating plan is a healthy eating plan that has been shown to reduce high blood pressure (hypertension). It may also reduce your risk for type 2 diabetes, heart disease, and stroke.  With the DASH eating plan, you should limit salt (sodium) intake to 2,300 mg a day. If you have hypertension, you may need to reduce your sodium intake to 1,500 mg a day.  When on the DASH eating plan, aim to eat more fresh fruits and vegetables, whole grains, lean proteins, low-fat dairy, and heart-healthy fats.  Work with your health care provider or diet and nutrition specialist (dietitian) to adjust your eating plan to your   individual calorie needs. This information is not intended to replace advice given to you by your health care provider. Make sure you discuss any questions you have with your health care provider. Document Revised: 07/29/2017 Document Reviewed: 08/09/2016 Elsevier Patient Education  2020 Elsevier Inc.  

## 2020-06-04 NOTE — Progress Notes (Signed)
   Subjective:    Patient ID: [name], female    DOB: 1946/06/13, 73 y.o.   MRN: [id number]  HPI  Patient arrives for a follow up on blood pressure and headaches. Patient states she is still having fatigue and dizziness with off and on headaches. Patient with fatigue tiredness feeling rundown feeling give out her sugars lately have been around 100 range since starting on the medication.  She has done a much better job watching her diet and stay more active has not been able to lose weight yet at this point.  PMH benign has underlying diabetes was under poor control  Review of Systems Please see above    Objective:   Physical Exam  Lungs clear respiratory rate normal heart regular no murmurs morbid obesity noted but patient is working on diet activity and losing weight Blood pressure good control      Assessment & Plan:  Diabetes-I believe it is in her best interest to stop her glipizide she will follow her sugars closely she will follow-up here in 3 to 4 weeks she will send [place] some readings for our review Morbid obesity she will watch portions try to lose weight Morbid obesity having direct effect on blood pressures and diabetes

## 2020-06-09 ENCOUNTER — Other Ambulatory Visit: Payer: Self-pay | Admitting: Family Medicine

## 2020-06-18 ENCOUNTER — Encounter: Payer: Medicare HMO | Attending: Family Medicine | Admitting: Nutrition

## 2020-06-18 ENCOUNTER — Telehealth: Payer: Self-pay

## 2020-06-18 ENCOUNTER — Encounter: Payer: Self-pay | Admitting: Nutrition

## 2020-06-18 ENCOUNTER — Other Ambulatory Visit: Payer: Self-pay

## 2020-06-18 VITALS — Ht 64.0 in | Wt 232.8 lb

## 2020-06-18 DIAGNOSIS — E1140 Type 2 diabetes mellitus with diabetic neuropathy, unspecified: Secondary | ICD-10-CM | POA: Insufficient documentation

## 2020-06-18 DIAGNOSIS — E1169 Type 2 diabetes mellitus with other specified complication: Secondary | ICD-10-CM | POA: Diagnosis not present

## 2020-06-18 DIAGNOSIS — I10 Essential (primary) hypertension: Secondary | ICD-10-CM | POA: Diagnosis not present

## 2020-06-18 DIAGNOSIS — E785 Hyperlipidemia, unspecified: Secondary | ICD-10-CM | POA: Diagnosis not present

## 2020-06-18 DIAGNOSIS — E7800 Pure hypercholesterolemia, unspecified: Secondary | ICD-10-CM | POA: Insufficient documentation

## 2020-06-18 DIAGNOSIS — E669 Obesity, unspecified: Secondary | ICD-10-CM | POA: Insufficient documentation

## 2020-06-18 DIAGNOSIS — E1165 Type 2 diabetes mellitus with hyperglycemia: Secondary | ICD-10-CM | POA: Diagnosis not present

## 2020-06-18 NOTE — Patient Instructions (Addendum)
Goals  Follow Plate Method Don't skip meals Eat 2-3 carb choices per meal Keep reading label Increase walking in house as tolerated with her foot. Lose 10 lbs in the next 3 months.

## 2020-06-18 NOTE — Progress Notes (Signed)
Medical Nutrition Therapy:  Appt start time: 0800 end time:  0900.  Assessment:  Primary concerns today: Diabetes Type 2, Obesity, Hyperlipidemia, HTN. Lives by herself. She is a widow. Lost her husband 2 years ago. She had a bunch of stress and was eating uncontrollable. She is now back on track and eating better. Stopped Glipizide and only on 1/2 pill of losartan. Controlling blood sugars with diet and starting to work more on exercise.  Has been doing a lot of home projects and getting some more exercise. Still grieving the lost of her husband. Wants to talk to a therapist.  Has cleaned out her pantries and got rid of a lot junk food and processed foods. FBS: 7 day 104, 14 day 98 30 day and 90 day 101 mg/dl.  Motivated and engaged to continue to eat healthier and start exercising more as her foot heels. She has broken her ankle a few times. Hasn't fell in the last year she reports.  Wt Readings from Last 3 Encounters:  06/18/20 232 lb 12.8 oz (105.6 kg)  06/04/20 234 lb 6.4 oz (106.3 kg)  05/14/20 235 lb (106.6 kg)   Ht Readings from Last 3 Encounters:  06/18/20 5\' 4"  (1.626 m)  06/04/20 5\' 4"  (1.626 m)  04/22/20 5\' 4"  (1.626 m)   Body mass index is 39.96 kg/m. @BMIFA @ Facility age limit for growth percentiles is 20 years. Facility age limit for growth percentiles is 20 years. CMP Latest Ref Rng & Units 04/22/2020 03/07/2020 11/22/2019  Glucose 65 - 99 mg/dL 202(H) 145(H) 105(H)  BUN 8 - 27 mg/dL 15 15 13   Creatinine 0.57 - 1.00 mg/dL 0.89 0.83 0.83  Sodium 134 - 144 mmol/L 139 138 144  Potassium 3.5 - 5.2 mmol/L 4.1 3.7 3.9  Chloride 96 - 106 mmol/L 98 99 103  CO2 20 - 29 mmol/L 29 30 26   Calcium 8.7 - 10.3 mg/dL 10.2 9.4 9.9  Total Protein 6.0 - 8.5 g/dL - - 6.9  Total Bilirubin 0.0 - 1.2 mg/dL - - 0.3  Alkaline Phos 39 - 117 IU/L - - 80  AST 0 - 40 IU/L - - 24  ALT 0 - 32 IU/L - - 20   Lab Results  Component Value Date   HGBA1C 9.0 (H) 04/22/2020    Preferred  Learning Style:  No preference indicated   Learning Readiness:    Ready  Change in progress   MEDICATIONS:   DIETARY INTAKE:    24-hr recall:  B ( AM): Cherrios, milk, or omelet and 1 slice bread, slice steak. L) Vegetable soup, mixed fruit, water D) Soup, 3 crackers, water  Usual physical activity:  Estimated energy needs: 1200  calories 135  g carbohydrates 90 g protein 42 g fat  Progress Towards Goal(s):  Resolved.   Nutritional Diagnosis:  NB-1.1 Food and nutrition-related knowledge deficit As related to Diabetes Type 2.  As evidenced by A1C 9%.    Intervention:  Nutrition and Diabetes education provided on My Plate, CHO counting, meal planning, portion sizes, timing of meals, avoiding snacks between meals unless having a low blood sugar, target ranges for A1C and blood sugars, signs/symptoms and treatment of hyper/hypoglycemia, monitoring blood sugars, taking medications as prescribed, benefits of exercising 30 minutes per day and prevention of complications of DM. Goals  Follow Plate Method Don't skip meals Eat 2-3 carb choices per meal Keep reading label Increase walking in house as tolerated with her foot. Lose 10 lbs in the next 3  months. Do chair exercises on UTube Find healthy recipes on UTube to try.   Teaching Method Utilized:  Visual Auditory Hands on  Handouts given during visit include:  The Plate Method   Meal Plan Card  Diabetes Instructions   Barriers to learning/adherence to lifestyle change: none  Demonstrated degree of understanding via:  Teach Back   Monitoring/Evaluation:  Dietary intake, exercise, , and body weight in 1 month(s).

## 2020-06-18 NOTE — Telephone Encounter (Signed)
Pt dropped off diabetes check for Dr Nicki Reaper. Paperwork placed in folder.  Pt contact [phone] or [phone]

## 2020-06-22 NOTE — Telephone Encounter (Signed)
Please tell the patient her glucose readings look good we will discuss things further when she follows up in November it is important that the patient does not skip meals.  Continue to try to eat healthy.  Thanks-Dr. Nicki Reaper

## 2020-06-23 NOTE — Telephone Encounter (Signed)
Patient advised per Dr Nicki Reaper: her glucose readings look good we will discuss things further when she follows up in November it is important that the patient does not skip meals.  Continue to try to eat healthy. Patient verbalized understanding.

## 2020-06-27 ENCOUNTER — Other Ambulatory Visit: Payer: Self-pay

## 2020-06-27 ENCOUNTER — Ambulatory Visit (INDEPENDENT_AMBULATORY_CARE_PROVIDER_SITE_OTHER): Payer: Medicare HMO | Admitting: Orthotics

## 2020-06-27 ENCOUNTER — Ambulatory Visit: Payer: Medicare HMO | Admitting: Podiatry

## 2020-06-27 DIAGNOSIS — S93492A Sprain of other ligament of left ankle, initial encounter: Secondary | ICD-10-CM | POA: Diagnosis not present

## 2020-06-27 DIAGNOSIS — M25572 Pain in left ankle and joints of left foot: Secondary | ICD-10-CM | POA: Diagnosis not present

## 2020-06-27 DIAGNOSIS — M25472 Effusion, left ankle: Secondary | ICD-10-CM | POA: Diagnosis not present

## 2020-06-27 DIAGNOSIS — E1142 Type 2 diabetes mellitus with diabetic polyneuropathy: Secondary | ICD-10-CM

## 2020-06-27 DIAGNOSIS — E1169 Type 2 diabetes mellitus with other specified complication: Secondary | ICD-10-CM

## 2020-06-27 DIAGNOSIS — M25372 Other instability, left ankle: Secondary | ICD-10-CM | POA: Diagnosis not present

## 2020-06-27 DIAGNOSIS — B351 Tinea unguium: Secondary | ICD-10-CM

## 2020-06-27 NOTE — Progress Notes (Signed)
  Subjective:  Patient ID: RAYMOND AZURE, female    DOB: 1946/01/05,  MRN: [id number]  Chief Complaint  Patient presents with  . Follow-up    6 week follow up left ankle-/pt interested in diabetic shoes**seeing EJ @ 1030 if qualifies..10.12.2021.Marland Kitchendm    74 y.o. female presents with the above complaint. History confirmed with patient. States the ankle left is getting worse.  Objective:  Physical Exam: warm, good capillary refill, no trophic changes or ulcerative lesions, normal DP and PT pulses and normal sensory exam. Left Foot: POP left ATFL with +anterior drawer, local edema.    Assessment:   1. Sprain of anterior talofibular ligament of left ankle, initial encounter   2. Ankle instability, left   3. Pain and swelling of left ankle    Plan:  Patient was evaluated and treated and all questions answered.  Ankle Instability Left -Educated on etiology of injury -CAM boot dispensed -Order MRI to eval ankle ligaments -Would consider surgical intervention if tear noted and pain persists  Return in about 3 weeks (around 07/18/2020) for ankle sprain f/u, MRI review.

## 2020-07-04 ENCOUNTER — Other Ambulatory Visit: Payer: Self-pay

## 2020-07-04 ENCOUNTER — Ambulatory Visit (INDEPENDENT_AMBULATORY_CARE_PROVIDER_SITE_OTHER): Payer: Medicare HMO | Admitting: Family Medicine

## 2020-07-04 ENCOUNTER — Encounter: Payer: Self-pay | Admitting: Family Medicine

## 2020-07-04 VITALS — BP 124/72 | Temp 97.4°F | Wt 228.4 lb

## 2020-07-04 DIAGNOSIS — I10 Essential (primary) hypertension: Secondary | ICD-10-CM

## 2020-07-04 DIAGNOSIS — Z23 Encounter for immunization: Secondary | ICD-10-CM

## 2020-07-04 DIAGNOSIS — E119 Type 2 diabetes mellitus without complications: Secondary | ICD-10-CM

## 2020-07-04 NOTE — Progress Notes (Signed)
   Subjective:    Patient ID: [name], female    DOB: [birth year], 74 y.o.   MRN: [id number]  HPI Pt here for 4 week follow up. Pt has sugar readings with her. Pt states she is doing well, feeling good. Has lost weight.  She is done remarkably well with watching her diet watching portions and trying to stay active.  She does state currently is getting over a sprain left ankle they are going to be doing an MRI to rule out a fracture or tear of the tendon until then she has to wear the brace in its causing her some right hip pain because of compensation Having pressure on right side. Started a while ago but then stopped. Since having boot on left foot, noticed pain more.   Her glucose readings actually look very good at   Review of Systems Denies any chest tightness pressure pain shortness of breath nausea from diarrhea    Objective:   Physical Exam  Lungs clear respiratory rate normal heart regular pulse normal BP good extremities no edema boot is noted in the left foot      Assessment & Plan:  Diabetes she is keeping this under control via diet we will hold off on adding any medicines she will send [place] more readings in several weeks she will follow-up in 3 months do lab work before them  She will follow up with podiatry with her foot  Patient is doing a good job losing weight continue current measures

## 2020-07-09 ENCOUNTER — Telehealth: Payer: Self-pay

## 2020-07-09 NOTE — Telephone Encounter (Signed)
Pt is schedule for an MRI of left ankle w/o contrast on 07/11/20. A prior authorization must be completed for this pt by today or the MRI will be cancelled.

## 2020-07-09 NOTE — Telephone Encounter (Signed)
Were we able to take care of this?

## 2020-07-10 ENCOUNTER — Telehealth: Payer: Self-pay | Admitting: Podiatry

## 2020-07-10 NOTE — Telephone Encounter (Signed)
The patient called and said her MRI was cancelled because they did not get a pre-auth. Her daughter took off from work to take her and she would still like to get it done tomorrow if possible. Please call her and let her know the next steps

## 2020-07-11 ENCOUNTER — Ambulatory Visit (HOSPITAL_COMMUNITY): Payer: Medicare HMO

## 2020-07-15 ENCOUNTER — Telehealth: Payer: Self-pay

## 2020-07-15 NOTE — Telephone Encounter (Signed)
Pt was called but did not answer. Left voicemail stating Dr. Eleanora Neighbor office was calling to discuss approval, and that she should call the office back to discuss it.

## 2020-07-18 ENCOUNTER — Ambulatory Visit: Payer: Medicare HMO | Admitting: Podiatry

## 2020-07-18 ENCOUNTER — Other Ambulatory Visit: Payer: Self-pay

## 2020-07-18 DIAGNOSIS — M779 Enthesopathy, unspecified: Secondary | ICD-10-CM | POA: Diagnosis not present

## 2020-07-18 DIAGNOSIS — M25372 Other instability, left ankle: Secondary | ICD-10-CM | POA: Diagnosis not present

## 2020-07-18 DIAGNOSIS — S93492A Sprain of other ligament of left ankle, initial encounter: Secondary | ICD-10-CM | POA: Diagnosis not present

## 2020-07-18 DIAGNOSIS — M25572 Pain in left ankle and joints of left foot: Secondary | ICD-10-CM

## 2020-07-18 DIAGNOSIS — M25472 Effusion, left ankle: Secondary | ICD-10-CM | POA: Diagnosis not present

## 2020-07-29 NOTE — Progress Notes (Signed)
  Subjective:  Patient ID: [name], female    DOB: [birth year],  MRN: [id number]  Chief Complaint  Patient presents with  . Ankle Injury    Pt states she hasn't gotten her MRI due to imaging location canceling her appointment. Pt states will reschedule her appointment and have the MRI done.    74 y.o. female presents with the above complaint. History confirmed with patient. States the ankle left is getting worse.  Objective:  Physical Exam: warm, good capillary refill, no trophic changes or ulcerative lesions, normal DP and PT pulses and normal sensory exam. Left Foot: POP left ATFL with +anterior drawer, local edema.    Assessment:   1. Sprain of anterior talofibular ligament of left ankle, initial encounter   2. Ankle instability, left   3. Pain and swelling of left ankle   4. Capsulitis    Plan:  Patient was evaluated and treated and all questions answered.  Ankle Instability Left -Continue immobilization in cam boot -Pending MRI to eval ankle ligaments.  Had to be rescheduled -Would consider surgical intervention if tear noted and pain persists -We will call with MRI results rather than having patient come to the office  No follow-ups on file.

## 2020-07-31 ENCOUNTER — Ambulatory Visit (HOSPITAL_COMMUNITY)
Admission: RE | Admit: 2020-07-31 | Discharge: 2020-07-31 | Disposition: A | Discharge status: Home / Self Care | Payer: Medicare HMO | Source: Ambulatory Visit | Attending: Podiatry | Admitting: Podiatry

## 2020-07-31 ENCOUNTER — Other Ambulatory Visit: Payer: Self-pay

## 2020-07-31 DIAGNOSIS — M19072 Primary osteoarthritis, left ankle and foot: Secondary | ICD-10-CM | POA: Diagnosis not present

## 2020-07-31 DIAGNOSIS — M7989 Other specified soft tissue disorders: Secondary | ICD-10-CM | POA: Diagnosis not present

## 2020-07-31 DIAGNOSIS — M7662 Achilles tendinitis, left leg: Secondary | ICD-10-CM | POA: Diagnosis not present

## 2020-07-31 DIAGNOSIS — S93492A Sprain of other ligament of left ankle, initial encounter: Secondary | ICD-10-CM | POA: Insufficient documentation

## 2020-08-01 DIAGNOSIS — I10 Essential (primary) hypertension: Secondary | ICD-10-CM | POA: Diagnosis not present

## 2020-08-01 DIAGNOSIS — E119 Type 2 diabetes mellitus without complications: Secondary | ICD-10-CM | POA: Diagnosis not present

## 2020-08-02 LAB — BASIC METABOLIC PANEL
BUN/Creatinine Ratio: 16 (ref 12–28)
BUN: 16 mg/dL (ref 8–27)
CO2: 23 mmol/L (ref 20–29)
Calcium: 9.5 mg/dL (ref 8.7–10.3)
Chloride: 105 mmol/L (ref 96–106)
Creatinine, Ser: 0.98 mg/dL (ref 0.57–1.00)
GFR calc Af Amer: 67 mL/min/{1.73_m2} (ref >59)
GFR calc non Af Amer: 58 mL/min/{1.73_m2} — ABNORMAL LOW (ref >59)
Glucose: 100 mg/dL — ABNORMAL HIGH (ref 65–99)
Potassium: 4.5 mmol/L (ref 3.5–5.2)
Sodium: 143 mmol/L (ref 134–144)

## 2020-08-02 LAB — HEMOGLOBIN A1C
Est. average glucose Bld gHb Est-mCnc: 146 mg/dL
Hgb A1c MFr Bld: 6.7 % — ABNORMAL HIGH (ref 4.8–5.6)

## 2020-08-06 ENCOUNTER — Encounter: Payer: Self-pay | Admitting: Student

## 2020-08-06 ENCOUNTER — Ambulatory Visit: Payer: Medicare HMO | Admitting: Cardiology

## 2020-08-06 ENCOUNTER — Other Ambulatory Visit: Payer: Self-pay

## 2020-08-06 ENCOUNTER — Ambulatory Visit (INDEPENDENT_AMBULATORY_CARE_PROVIDER_SITE_OTHER): Payer: Medicare HMO | Admitting: Student

## 2020-08-06 VITALS — BP 110/68 | HR 55 | Ht 65.0 in | Wt 228.0 lb

## 2020-08-06 DIAGNOSIS — R072 Precordial pain: Secondary | ICD-10-CM

## 2020-08-06 DIAGNOSIS — R001 Bradycardia, unspecified: Secondary | ICD-10-CM | POA: Diagnosis not present

## 2020-08-06 DIAGNOSIS — Z01810 Encounter for preprocedural cardiovascular examination: Secondary | ICD-10-CM | POA: Diagnosis not present

## 2020-08-06 DIAGNOSIS — E785 Hyperlipidemia, unspecified: Secondary | ICD-10-CM | POA: Diagnosis not present

## 2020-08-06 DIAGNOSIS — I10 Essential (primary) hypertension: Secondary | ICD-10-CM

## 2020-08-06 MED ORDER — LOSARTAN POTASSIUM 50 MG PO TABS: 50.0000 mg | ORAL_TABLET | Freq: Every day | ORAL | 3 refills | Status: DC

## 2020-08-06 NOTE — Patient Instructions (Signed)
Medication Instructions:  Your physician recommends that you continue on your current medications as directed. Please refer to the Current Medication list given to you today.  *If you need a refill on your cardiac medications before your next appointment, please call your pharmacy*   Lab Work: NONE If you have labs (blood work) drawn today and your tests are completely normal, you will receive your results only by: [name] MyChart Message (if you have MyChart) OR . A paper copy in the mail If you have any lab test that is abnormal or we need to change your treatment, we will call you to review the results.   Testing/Procedures: NONE   Follow-Up: At [facility], you and your health needs are our priority.  As part of our continuing mission to provide you with exceptional heart care, we have created designated Provider Care Teams.  These Care Teams include your primary Cardiologist (physician) and Advanced Practice Providers (APPs -  Physician Assistants and Nurse Practitioners) who all work together to provide you with the care you need, when you need it.  We recommend signing up for the patient portal called "MyChart".  Sign up information is provided on this After Visit Summary.  MyChart is used to connect with patients for Virtual Visits (Telemedicine).  Patients are able to view lab/test results, encounter notes, upcoming appointments, etc.  Non-urgent messages can be sent to your provider as well.   To learn more about what you can do with MyChart, go to [email or web address].    Your next appointment:   6 month(s)  The format for your next appointment:   In Person  Provider:   You may see DR. BRANCH or [employer], PA-C

## 2020-08-06 NOTE — Progress Notes (Signed)
Cardiology Office Note    Date:  08/06/2020   ID:  [name], DOB June 19, 1946, MRN [id number]  PCP:  [name], MD  Cardiologist: [name], MD    Chief Complaint  Patient presents with  . Follow-up    6 month visit    History of Present Illness:    [name] is a 74 y.o. female with past medical history of chest pain (mild CAD by cath in 2005 and 2012 with low-risk NST's in 07/2016 and 05/2019), HTN, HLD and history of DVT who presents to the office today for 35-monthfollow-up.   She was last examined by [name] 01/2020 and reported positional chest pain which could last for hours at a time but denied any exertional symptoms. She was on Plavix given prior intolerances to ASA. Given her elevated BP, HCTZ was discontinued and transitioned to Chlorthalidone 284mdaily.   In talking with the patient today, she reports doing well from a cardiac perspective since her last visit. She has been focusing on dietary changes and has lost over 25 pounds within the past several months. She reports Chlorthalidone was discontinued by her PCP in the interim due to soft BP. Losartan was also reduced from 100 mg daily to 50 mg daily.  She has been followed by Podiatry for left ankle issues and reports she will require surgical repair but is unsure of an exact surgery date. She says her activity has been limited due to this that she has been in a boot. Prior to this, she was very active around the house and was painting her porch. She denies any exertional chest pain or dyspnea on exertion. She does have intermittent episodes of chest discomfort which are reproducible with palpation and are similar to her prior episodes as described above with reassuring cardiac work-up. She denies any specific orthopnea, PND or lower extremity edema  Heart rate was initially recorded in the 40's today but rechecked and at 55. She does report some dizziness but feels like this might  be secondary to her dietary changes. Denies any presyncopal episodes.   Past Medical History:  Diagnosis Date  . Blood clot of artery under arm ([name]  . Cyst on liver  . Depression   . Diabetes mellitus    controlled by diet  . Diverticulitis   . DVT of axillary vein, acute right ([name]   Dx on Jan 28, 2012.    . Fibrocystic breast disease   . Hepatic cyst   . Hypertension   . IBS (irritable bowel syndrome)   . Impaired glucose tolerance   . Menopause     Past Surgical History:  Procedure Laterality Date  . ABDOMINAL HYSTERECTOMY    . BACK SURGERY    . BREAST SURGERY  right   cyst removed  . CAST APPLICATION  02/04/67/1157 Procedure: CAST APPLICATION;  Surgeon: [name];  Location: [facility];  Service: Orthopedics;  Laterality: Right;  procedure room   . CHOLECYSTECTOMY    . COLONOSCOPY  12/04/2004   NUR:Few small diverticula at sigmoid colon/Small cecal polyp ablated   . COLONOSCOPY N/A 09/24/2014   SLF: small internal hemorrhoids  . COLONOSCOPY N/A 02/15/2020   Procedure: COLONOSCOPY;  Surgeon: [name];  Location: [facility];  Service: Endoscopy;  Laterality: N/A;  8:30am  . ESOPHAGOGASTRODUODENOSCOPY N/A 02/02/2016   Procedure: ESOPHAGOGASTRODUODENOSCOPY (EGD);  Surgeon: [name];  Location: [facility];  Service: Endoscopy;  Laterality: N/A;  930   . KNEE ARTHROCENTESIS  left  . POLYPECTOMY  02/15/2020   Procedure: POLYPECTOMY;  Surgeon: [name], MD;  Location: [facility];  Service: Endoscopy;;  . TUBAL LIGATION      Current Medications: Outpatient Medications Prior to Visit  Medication Sig Dispense Refill  . acetaminophen (TYLENOL) 500 MG tablet Take 1,000 mg by mouth every 6 (six) hours as needed for mild pain, moderate pain or headache.     . clopidogrel (PLAVIX) 75 MG tablet Take 1 tablet (75 mg total) by mouth daily. (Patient taking differently: Take 75 mg by mouth at bedtime. ) 90 tablet 3  . gabapentin  (NEURONTIN) 100 MG capsule Take 1 capsule (100 mg total) by mouth at bedtime. 90 capsule 1  . HYDROcodone-acetaminophen (NORCO/VICODIN) 5-325 MG tablet Take 1 tablet by mouth every 6 (six) hours as needed for moderate pain.     . indomethacin (INDOCIN) 25 MG capsule One tid for headache not for long term use 15 capsule 0  . Multiple Vitamins-Minerals (MULTIVITAMIN WITH MINERALS) tablet Take 1 tablet by mouth daily.    . pantoprazole (PROTONIX) 40 MG tablet Take 1 tablet (40 mg total) by mouth daily. 90 tablet 1  . pravastatin (PRAVACHOL) 20 MG tablet TAKE 1 TABLET (20 MG TOTAL) BY MOUTH AT BEDTIME. 90 tablet 0  . PRODIGY NO CODING BLOOD GLUC test strip     . SUPREP BOWEL PREP KIT 17.5-3.13-1.6 GM/177ML SOLN SMARTSIG:1 Kit(s) By Mouth Once    . losartan (COZAAR) 50 MG tablet Take one half tablet daily (Patient taking differently: Take 50 mg by mouth daily. ) 90 tablet 1  . Accu-Chek Softclix Lancets lancets     . chlorthalidone (HYGROTON) 25 MG tablet Take 1 tablet (25 mg total) by mouth daily. (Patient not taking: Reported on 06/04/2020) 90 tablet 3   No facility-administered medications prior to visit.     Allergies:   Norvasc [amlodipine besylate], Penicillins, Advair diskus [fluticasone-salmeterol], and Metformin and related   Social History   Socioeconomic History  . Marital status: Widowed    Spouse name: Not on file  . Number of children: Not on file  . Years of education: Not on file  . Highest education level: Not on file  Occupational History  . Occupation: Retired  Tobacco Use  . Smoking status: Never Smoker  . Smokeless tobacco: Never Used  . Tobacco comment: Never smoked  Vaping Use  . Vaping Use: Never used  Substance and Sexual Activity  . Alcohol use: No    Alcohol/week: 0.0 standard drinks  . Drug use: No  . Sexual activity: Yes  Other Topics Concern  . Not on file  Social History Narrative  . Not on file   Social Determinants of Health   Financial Resource  Strain:   . Difficulty of Paying Living Expenses: Not on file  Food Insecurity:   . Worried About Charity fundraiser in the Last Year: Not on file  . Ran Out of Food in the Last Year: Not on file  Transportation Needs:   . Lack of Transportation (Medical): Not on file  . Lack of Transportation (Non-Medical): Not on file  Physical Activity:   . Days of Exercise per Week: Not on file  . Minutes of Exercise per Session: Not on file  Stress:   . Feeling of Stress : Not on file  Social Connections:   . Frequency of Communication with Friends and  Family: Not on file  . Frequency of Social Gatherings with Friends and Family: Not on file  . Attends Religious Services: Not on file  . Active Member of Clubs or Organizations: Not on file  . Attends Archivist Meetings: Not on file  . Marital Status: Not on file     Family History:  The patient's family history includes Cancer in her sister; Diabetes in her brother and sister; Heart attack in her mother; Heart disease in her brother; Kidney disease in her son; Seizures in her brother; Stroke in her mother.   Review of Systems:   Please see the history of present illness.     General:  No chills, fever, night sweats or weight changes. Cardiovascular:  No dyspnea on exertion, edema, orthopnea, palpitations, paroxysmal nocturnal dyspnea. Positive for chest pain.  Dermatological: No rash, lesions/masses Respiratory: No cough, dyspnea Urologic: No hematuria, dysuria Abdominal:   No nausea, vomiting, diarrhea, bright red blood per rectum, melena, or hematemesis Neurologic:  No visual changes, wkns, changes in mental status. All other systems reviewed and are otherwise negative except as noted above.   Physical Exam:    VS:  BP 110/68   Pulse (!) 55   Ht 5' 5"  (1.651 m)   Wt 228 lb (103.4 kg)   SpO2 98%   BMI 37.94 kg/m    General: Well developed, well nourished,female appearing in no acute distress. Head: Normocephalic,  atraumatic. Neck: No carotid bruits. JVD not elevated.  Lungs: Respirations regular and unlabored, without wheezes or rales.  Heart: Regular rhythm, bradycardiac rate. No S3 or S4.  No murmur, no rubs, or gallops appreciated. Abdomen: Appears non-distended. No obvious abdominal masses. Msk:  Strength and tone appear normal for age. No obvious joint deformities or effusions. Extremities: No clubbing or cyanosis. No edema. Left foot in boot.  Neuro: Alert and oriented X 3. Moves all extremities spontaneously. No focal deficits noted. Psych:  Responds to questions appropriately with a normal affect. Skin: No rashes or lesions noted  Wt Readings from Last 3 Encounters:  08/06/20 228 lb (103.4 kg)  07/04/20 228 lb 6.4 oz (103.6 kg)  06/18/20 232 lb 12.8 oz (105.6 kg)     Studies/Labs Reviewed:   EKG:  EKG is not ordered today. EKG from 01/21/2020 is reviewed and shows sinus bradycardia, HR 55 with no acute ST changes when compared to prior tracings.   Recent Labs: 11/22/2019: ALT 20; Hemoglobin 13.0; Platelets 323 03/07/2020: Magnesium 2.0 08/01/2020: BUN 16; Creatinine, Ser 0.98; Potassium 4.5; Sodium 143   Lipid Panel    Component Value Date/Time   CHOL 119 11/22/2019 1034   TRIG 89 11/22/2019 1034   HDL 47 11/22/2019 1034   CHOLHDL 2.5 11/22/2019 1034   CHOLHDL 3.4 09/26/2014 1019   VLDL 17 09/26/2014 1019   LDLCALC 55 11/22/2019 1034    Additional studies/ records that were reviewed today include:   NST: 05/2019  There was no ST segment deviation noted during stress. RBBB seen throughout study.  The study is normal. No myocardial ischemia or scar.  This is a low risk study.  Nuclear stress EF: 73%.    Assessment:    1. Precordial chest pain   2. Essential hypertension   3. Bradycardia   4. Hyperlipidemia LDL goal <70   5. Preoperative cardiovascular examination      Plan:   In order of problems listed above:  1. Precordial Chest Pain - By review of notes,  she has a  history of atypical chest pain with prior reassuring cardiac work-up including mild CAD by cath in 2005 and 2012 with low-risk NST's in 07/2016 and 05/2019. - She does experience intermittent episodes of chest discomfort but says this is reproducible on palpation and denies any exertional chest pain. She has remained on Plavix 75 mg daily for prevention by review of notes given intolerances to ASA. Continue this along with Pravastatin 20 mg daily.  2. HTN - BP is well controlled at 110/68 during today's visit. Continue Losartan 50 mg daily. Her BP has improved with weight loss and she was encouraged to follow this at home. I will reach out to Social Work today to see if a cuff can be sent to the patient.   3. Bradycardia - Heart rate was initially recorded in the 40's and rechecked and at 55 which is similar to prior values. I did check ambulatory oxygen saturations and heart rate and her oxygen saturations remained greater than 95% and her heart rate quickly increased into the 80's with activity. - Will continue to follow for now. She is not on any AV nodal blocking agents. We reviewed that if she does find her heart rate is in the 40's frequently at home or she develops progressive dizziness, would plan for an event monitor.  She wishes to hold off on this for now given her upcoming foot surgery.  4. HLD - Followed by PCP. FLP in 10/2019 showed total cholesterol 119, triglycerides 89, HDL 47 and LDL 55. She remains on Pravastatin 20 mg daily.  5. Preoperative cardiac clearance - Per her report, she will likely require surgery on her left ankle in the coming weeks but this has not yet been scheduled. From a cardiac perspective, her RCRI risk is overall low at 0.4% risk of a major cardiac event. She did have a reassuring stress test last year and is able to perform 4 METS of activity without anginal symptoms. I would not anticipate further cardiac testing prior to surgery as she would be of  acceptable risk to proceed. She has been on Plavix given prior intolerances to ASA and given no recent intervention, she should be able to hold this for 5 days prior to her surgery if needed. Will verify with Dr. Harl Bowie as well.     Medication Adjustments/Labs and Tests Ordered: Current medicines are reviewed at length with the patient today.  Concerns regarding medicines are outlined above.  Medication changes, Labs and Tests ordered today are listed in the Patient Instructions below. Patient Instructions  Medication Instructions:  Your physician recommends that you continue on your current medications as directed. Please refer to the Current Medication list given to you today.  *If you need a refill on your cardiac medications before your next appointment, please call your pharmacy*   Lab Work: NONE If you have labs (blood work) drawn today and your tests are completely normal, you will receive your results only by: [name] MyChart Message (if you have MyChart) OR . A paper copy in the mail If you have any lab test that is abnormal or we need to change your treatment, we will call you to review the results.   Testing/Procedures: NONE   Follow-Up: At [facility], you and your health needs are our priority.  As part of our continuing mission to provide you with exceptional heart care, we have created designated Provider Care Teams.  These Care Teams include your primary Cardiologist (physician) and Advanced Practice Providers (APPs -  Physician Assistants and Nurse Practitioners) who all work together to provide you with the care you need, when you need it.  We recommend signing up for the patient portal called "MyChart".  Sign up information is provided on this After Visit Summary.  MyChart is used to connect with patients for Virtual Visits (Telemedicine).  Patients are able to view lab/test results, encounter notes, upcoming appointments, etc.  Non-urgent messages can be sent to your  provider as well.   To learn more about what you can do with MyChart, go to [email or web address].    Your next appointment:   6 month(s)  The format for your next appointment:   In Person  Provider:   You may see DR. BRANCH or [place], PA-C           Signed, Waynetta Pean  08/06/2020 4:47 PM    [place] [address] [place], [place] [address] Phone: [phone] Fax: [phone]

## 2020-08-07 ENCOUNTER — Telehealth: Payer: Self-pay

## 2020-08-07 ENCOUNTER — Telehealth: Payer: Self-pay | Admitting: Licensed Clinical Social Worker

## 2020-08-07 NOTE — Telephone Encounter (Signed)
Pt wanted message to Dr Nicki Reaper blood pressure is 100/68 and heart rate is 42 and pt wants to know what she needs to do she is having slight dizziness   Pt [phone]

## 2020-08-07 NOTE — Telephone Encounter (Signed)
Nurses I reviewed over her visit with cardiology When they checked her oxygen saturation with activity it was good and her heart rate came up appropriately.  It is possible if she continues with healthy eating that we will need to reduce her medicine even further.  I believe things will go okay for her if she would feel better about following up with [place] anywhere in the next 2 to 3 weeks that would be fine-then we can go ahead and give her an appointment for the week of the 20th or 27th sooner if having troubles.  If she feels okay about how things are going we could see her in January if she would rather do that  Based on what I am seeing currently I would not recommend pacemaker

## 2020-08-07 NOTE — Telephone Encounter (Signed)
Patient states she is having ligament surgery on her foot and has he pre op assessment  next week an then will have surgery. Patient scheduled follow up visit next week with Dr Nicki Reaper to recheck her blood pressure with upcoming surgery.

## 2020-08-07 NOTE — Telephone Encounter (Signed)
Pt states these vitals are from yesterday that was taking at the cardiologist office. Pt states she was not told what to do about it. States she was going to send her a bp monitor and said she may need a pace maker if heart rate drops in the 30's. States it may be next year before she gets the monitor. Has no way of checking bp and pulse at home.  States she is dizzy off and on. Pt states right now no dizziness but feels tired. Would like to know what dr Nicki Reaper thinks. Taking losartan.

## 2020-08-07 NOTE — Telephone Encounter (Signed)
CSW referred to assist patient with obtaining a BP cuff. CSW contacted patient to inform cuff will be delivered to home. Patient grateful for support and assistance. CSW available as needed. Jackie Vernel Langenderfer, LCSW, CCSW-MCS [phone]  

## 2020-08-07 NOTE — Telephone Encounter (Signed)
Ok thanks 

## 2020-08-12 ENCOUNTER — Ambulatory Visit: Payer: Medicare HMO | Admitting: Podiatry

## 2020-08-12 ENCOUNTER — Ambulatory Visit (INDEPENDENT_AMBULATORY_CARE_PROVIDER_SITE_OTHER): Payer: Medicare HMO | Admitting: Orthotics

## 2020-08-12 ENCOUNTER — Other Ambulatory Visit: Payer: Self-pay

## 2020-08-12 DIAGNOSIS — Z9181 History of falling: Secondary | ICD-10-CM | POA: Diagnosis not present

## 2020-08-12 DIAGNOSIS — E1142 Type 2 diabetes mellitus with diabetic polyneuropathy: Secondary | ICD-10-CM

## 2020-08-12 DIAGNOSIS — M2042 Other hammer toe(s) (acquired), left foot: Secondary | ICD-10-CM

## 2020-08-12 DIAGNOSIS — M19079 Primary osteoarthritis, unspecified ankle and foot: Secondary | ICD-10-CM

## 2020-08-12 DIAGNOSIS — M25372 Other instability, left ankle: Secondary | ICD-10-CM

## 2020-08-12 DIAGNOSIS — S93492A Sprain of other ligament of left ankle, initial encounter: Secondary | ICD-10-CM

## 2020-08-12 DIAGNOSIS — M2041 Other hammer toe(s) (acquired), right foot: Secondary | ICD-10-CM | POA: Diagnosis not present

## 2020-08-12 NOTE — Progress Notes (Signed)

## 2020-08-12 NOTE — Progress Notes (Signed)
  Subjective:  Patient ID: [name], female    DOB: [birth year],  MRN: [id number]  Chief Complaint  Patient presents with  . Ankle Injury    Surgical consult   74 y.o. female presents with the above complaint. History confirmed with patient. Here for MRI f/u and surgical planning visit.  Objective:  Physical Exam: warm, good capillary refill, no trophic changes or ulcerative lesions, normal DP and PT pulses and normal sensory exam. Left Foot: POP left ATFL with +anterior drawer, local edema.    Assessment:   1. Sprain of anterior talofibular ligament of left ankle, initial encounter   2. Ankle instability, left   3. At high risk for injury related to fall   4. DM type 2 with diabetic peripheral neuropathy (Seth Ward)   5. Hammertoes of both feet    Plan:  Patient was evaluated and treated and all questions answered.  Ankle Instability Left -Continue immobilization in brace. New brace dispensed today -Refer to PT for pre-hab. Will need to be NWB post-surgery. -Patient has failed all conservative therapy and wishes to proceed with surgical intervention. All risks, benefits, and alternatives discussed with patient. No guarantees given. Consent reviewed and signed by patient. -Planned procedures: Left ankle ATFL repair, augmentation. -Identified risk factors: DM with DPN -DME dispensed for post-op use: Ankle Brace   No follow-ups on file.

## 2020-08-15 ENCOUNTER — Ambulatory Visit (INDEPENDENT_AMBULATORY_CARE_PROVIDER_SITE_OTHER): Payer: Medicare HMO | Admitting: Family Medicine

## 2020-08-15 ENCOUNTER — Encounter: Payer: Self-pay | Admitting: Family Medicine

## 2020-08-15 ENCOUNTER — Other Ambulatory Visit: Payer: Self-pay

## 2020-08-15 VITALS — BP 126/68 | HR 83 | Temp 97.7°F | Ht 65.0 in | Wt 227.0 lb

## 2020-08-15 DIAGNOSIS — E119 Type 2 diabetes mellitus without complications: Secondary | ICD-10-CM

## 2020-08-15 LAB — POCT GLUCOSE (DEVICE FOR HOME USE): POC Glucose: 121 mg/dl — AB (ref 70–99)

## 2020-08-15 NOTE — Progress Notes (Signed)
   Subjective:    Patient ID: [name], female    DOB: 08/16/1946, 74 y.o.   MRN: [id number]  HPI Follow up on blood pressure.   Pt states she was dizzy and asked to have her blood sugar checked. Felt like it was dropping.  Glucose was checked there is no sign of any type of any low sugar today  Pt having foot surgery in febuary. Pt states she wants referral to home health and PT. Requesting CNA also for after surgery. States her insurance will pay for it and Podiatrist office told her pcp would have to set it up for her.   The patient is very concerned that she has underlying heart related issues that could cause a problem with the surgery she is concerned about her blood pressure potentially being low she is also concerned about heart rate being low.  Reviewed over the cardiologist note her heart rate was slightly low but not in a abnormal way  Results for orders placed or performed in visit on 08/15/20  POCT Glucose (Device for Home Use)  Result Value Ref Range   Glucose Fasting, POC     POC Glucose 121 (A) 70 - 99 mg/dl    Review of Systems     Objective:   Physical Exam Lungs clear heart regular pulse normal BP good       Assessment & Plan:  Slight hyperglycemia not worrisome Blood pressure heart rate overall good If her heart rate goes down over time she may need telemetry and possibly a pacemaker Patient should be able to do well for surgery but she is worried She request to do a follow-up in 1 month before her surgery in February  Home health?see above

## 2020-08-19 NOTE — Progress Notes (Signed)
Please advise. Thank you

## 2020-08-20 NOTE — Progress Notes (Signed)
Per Loma Sousa, PCP usually puts in the referral to home health. Please advise. Thank you

## 2020-08-25 NOTE — Progress Notes (Signed)
[facility] put in a referral to PT; do we still need to do Home Health referral? Please advise. Thank you

## 2020-08-31 NOTE — Progress Notes (Signed)
So I realize this can be a hassle But we are the primary care physician and whether we like it or not much falls on [place] as a practice So therefore- Please put in referral for home health, but first communicate with patient verify when her surgery is Put in for home health as postoperative The patient should be aware though that home health will be able to come out and check on her but they do not provide a day to day attendant If patient is interested in hiring someone home health can provide a list of individuals Please put in the referral see if home health can it please connect with the patient before surgery to try to help her with coordination of her home care after surgery If there is additional stuff I can do please let me know

## 2020-09-01 ENCOUNTER — Ambulatory Visit (HOSPITAL_COMMUNITY): Payer: Medicare HMO | Admitting: Physical Therapy

## 2020-09-03 ENCOUNTER — Other Ambulatory Visit: Payer: Self-pay

## 2020-09-03 ENCOUNTER — Ambulatory Visit (HOSPITAL_COMMUNITY): Payer: Medicare HMO | Attending: Podiatry | Admitting: Physical Therapy

## 2020-09-03 ENCOUNTER — Encounter (HOSPITAL_COMMUNITY): Payer: Self-pay | Admitting: Physical Therapy

## 2020-09-03 DIAGNOSIS — M25572 Pain in left ankle and joints of left foot: Secondary | ICD-10-CM | POA: Insufficient documentation

## 2020-09-03 DIAGNOSIS — R29898 Other symptoms and signs involving the musculoskeletal system: Secondary | ICD-10-CM | POA: Diagnosis not present

## 2020-09-03 DIAGNOSIS — M6281 Muscle weakness (generalized): Secondary | ICD-10-CM | POA: Diagnosis not present

## 2020-09-03 DIAGNOSIS — R2689 Other abnormalities of gait and mobility: Secondary | ICD-10-CM | POA: Diagnosis not present

## 2020-09-03 NOTE — Therapy (Signed)
[facility] [address] [place], [place], [address] Phone: [phone]   Fax:  [phone]  Physical Therapy Evaluation  Patient Details  Name: [name] MRN: [id number] Date of Birth: 31-Jul-1946 Referring Provider (PT): Ventura Sellers DPM   Encounter Date: 09/03/2020   PT End of Session - 09/03/20 0953    Visit Number 1    Number of Visits 8    Date for PT Re-Evaluation 10/01/20    Authorization Type [employer]    Authorization Time Period 8 visits requested 09/03/20-10/01/20 - check auth    Authorization - Visit Number 1    Authorization - Number of Visits 1    PT Start Time [phone]    PT Stop Time 1037    PT Time Calculation (min) 42 min    Activity Tolerance Patient tolerated treatment well    Behavior During Therapy [facility] for tasks assessed/performed           Past Medical History:  Diagnosis Date   Blood clot of artery under arm (HCC)    Cyst on liver   Depression    Diabetes mellitus    controlled by diet   Diverticulitis    DVT of axillary vein, acute right (HCC) 11/24/2012   Dx on Jan 28, 2012.     Fibrocystic breast disease    Hepatic cyst    Hypertension    IBS (irritable bowel syndrome)    Impaired glucose tolerance    Menopause     Past Surgical History:  Procedure Laterality Date   ABDOMINAL HYSTERECTOMY     BACK SURGERY     BREAST SURGERY  right   cyst removed   CAST APPLICATION  02/14/2012   Procedure: CAST APPLICATION;  Surgeon: Vickki Hearing, MD;  Location: [facility];  Service: Orthopedics;  Laterality: Right;  procedure room    CHOLECYSTECTOMY     COLONOSCOPY  12/04/2004   NUR:Few small diverticula at sigmoid colon/Small cecal polyp ablated    COLONOSCOPY N/A 09/24/2014   SLF: small internal hemorrhoids   COLONOSCOPY N/A 02/15/2020   Procedure: COLONOSCOPY;  Surgeon: Corbin Ade, MD;  Location: [facility];  Service: Endoscopy;  Laterality: N/A;  8:30am    ESOPHAGOGASTRODUODENOSCOPY N/A 02/02/2016   Procedure: ESOPHAGOGASTRODUODENOSCOPY (EGD);  Surgeon: West Bali, MD;  Location: [facility];  Service: Endoscopy;  Laterality: N/A;  930    KNEE ARTHROCENTESIS  left   POLYPECTOMY  02/15/2020   Procedure: POLYPECTOMY;  Surgeon: Corbin Ade, MD;  Location: [facility];  Service: Endoscopy;;   TUBAL LIGATION      There were no vitals filed for this visit.    Subjective Assessment - 09/03/20 0955    Subjective Patient is a 75 y.o. female who presents to physical therapy with c/o L ankle pain due to ATFL sprain, ankle instability, and being a high risk for falling. Patient is being referred to therapy for prehab. She has to have surgery on her ankle on Feb 2nd. She has broken both of her ankles in the past. She just got out of her boot about 3 weeks ago. All of these ankle injuries have been in the last few years. She was in boot for about 6 weeks with a torn ligament. She has ankle pain sometimes due to the weather. Tylenol seems to help sometimes. Symptoms worse when she puts a lot of weight on it. She has to walk with a cane to relieve pressure  on her ankle. She does not use cane inside. She hopes to avoid surgery. She gets alot of swelling.    Pertinent History ankle fractures    Limitations Walking;House hold activities;Standing    Patient Stated Goals avoid surgery    Currently in Pain? No/denies              [facility] PT Assessment - 09/03/20 0001      Assessment   Medical Diagnosis L ankle sprain of ATFL, ankle instability, high risk for falls    Referring Provider (PT) Ventura Sellers DPM    Onset Date/Surgical Date 02/28/20    Next MD Visit none scheduled    Prior Therapy yes ankle      Precautions   Precautions Fall      Restrictions   Weight Bearing Restrictions No      Balance Screen   Has the patient fallen in the past 6 months No    Has the patient had a decrease in activity level because of a fear of falling?  No     Is the patient reluctant to leave their home because of a fear of falling?  No      Prior Function   Level of Independence Independent    Vocation Retired      Copy Status Within Functional Limits for tasks assessed      Observation/Other Assessments   Observations ambulates with AD    Focus on Therapeutic Outcomes (FOTO)  complete next session      ROM / Strength   AROM / PROM / Strength AROM;Strength      AROM   AROM Assessment Site Ankle    Right/Left Ankle Right;Left    Right Ankle Dorsiflexion 5    Right Ankle Plantar Flexion 25    Right Ankle Inversion 20    Right Ankle Eversion 15    Left Ankle Dorsiflexion 1   lacking   Left Ankle Plantar Flexion 28    Left Ankle Inversion 20    Left Ankle Eversion 21      Strength   Strength Assessment Site Hip;Knee;Ankle    Right/Left Hip Right;Left    Right Hip Flexion 4+/5    Left Hip Flexion 4+/5    Right/Left Knee Right;Left    Right Knee Flexion 5/5    Right Knee Extension 5/5    Left Knee Flexion 5/5    Left Knee Extension 5/5    Right/Left Ankle Right;Left    Right Ankle Dorsiflexion 5/5    Right Ankle Inversion 5/5    Right Ankle Eversion 5/5    Left Ankle Dorsiflexion 5/5    Left Ankle Inversion 5/5    Left Ankle Eversion 4+/5   slight pain     Palpation   Palpation comment TTP L ATFL, inferior to lateral malleolus; not tender throughout rest of foot, ankle; Hypomobile talocrual AP      Special Tests   Other special tests + anterior drawer L ankle      Ambulation/Gait   Ambulation/Gait Yes    Ambulation/Gait Assistance 7: Independent    Ambulation Distance (Feet) 250 Feet    Assistive device None    Gait Pattern Left foot flat    Gait Comments , decreased L ankle DF/ early heel off      Balance   Balance Assessed Yes   SLS <3 seconds R, requires UE support, able to balance LLE 3 seconds without support, pain 2/10  Objective measurements  completed on examination: See above findings.               PT Education - 09/03/20 [phone]    Education Details Patient educated on exam findings, POC, scope of PT, ankle sprains, importance of dynamic ankle stability, ankle OA    Person(s) Educated Patient    Methods Explanation;Demonstration    Comprehension Verbalized understanding;Returned demonstration            PT Short Term Goals - 09/03/20 0953      PT SHORT TERM GOAL #1   Title Patient will be independent with HEP in order to improve functional outcomes.    Time 2    Period Weeks    Status New    Target Date 09/17/20      PT SHORT TERM GOAL #2   Title Patient will report at least 25% improvement in symptoms for improved quality of life.    Time 2    Period Weeks    Status New    Target Date 09/17/20             PT Long Term Goals - 09/03/20 1043      PT LONG TERM GOAL #1   Title Patient will report at least 75% improvement in symptoms for improved quality of life.    Time 4    Period Weeks    Status New    Target Date 10/01/20      PT LONG TERM GOAL #2   Title Patient will be able to ambulate at least 300 feet in 2MWT in order to demonstrate improved tolerance to activity.    Time 4    Period Weeks    Status New    Target Date 10/01/20      PT LONG TERM GOAL #3   Title Patient will perform single limb stance on left/right lower extremity for 5 seconds to show improved balance for navigating stairs.    Time 4    Period Weeks    Status New    Target Date 10/01/20                  Plan - 09/03/20 1046    Clinical Impression Statement Patient is a 75 y.o. female who presents to physical therapy with c/o L ankle pain due to ATFL sprain, ankle instability, and being a high risk for falling. She presents with intermittent low grade pain with weightbearing and deficits in L ankle strength, ROM, gait, balance, and functional mobility with ADL. She is having to modify and restrict ADL as  indicated by subjective information and objective measures which is affecting overall participation. Patient will benefit from skilled physical therapy in order to improve function and reduce impairment.    Personal Factors and Comorbidities Age;Behavior Pattern;Social Background;Fitness;Past/Current Experience;Finances    Examination-Activity Limitations Transfers;Locomotion Level;Stairs;Squat    Examination-Participation Restrictions Cleaning;Meal Prep;Yard Work;Volunteer;Shop    Stability/Clinical Decision Making Stable/Uncomplicated    Clinical Decision Making Low    Rehab Potential Good    PT Frequency 2x / week    PT Duration 4 weeks    PT Treatment/Interventions ADLs/Self Care Home Management;Electrical Stimulation;Cryotherapy;Iontophoresis 4mg /ml Dexamethasone;Moist Heat;Traction;Ultrasound;Parrafin;Fluidtherapy;Contrast Bath;DME Instruction;Gait training;Stair training;Functional mobility training;Therapeutic activities;Therapeutic exercise;Balance training;Neuromuscular re-education;Patient/family education;Orthotic Fit/Training;Compression bandaging;Manual techniques;Dry needling;Energy conservation;Splinting;Taping;Joint Manipulations;Spinal Manipulations    PT Next Visit Plan begin L ankle DF mobility, ankle strenghtening, static and dynamic balance for ankle stability, complete FOTO    PT Home Exercise Plan initiate next session  Consulted and Agree with Plan of Care Patient           Patient will benefit from skilled therapeutic intervention in order to improve the following deficits and impairments:  Abnormal gait,Difficulty walking,Decreased range of motion,Decreased endurance,Decreased activity tolerance,Hypomobility,Improper body mechanics,Decreased mobility,Decreased strength  Visit Diagnosis: Muscle weakness (generalized)  Other abnormalities of gait and mobility  Pain in left ankle and joints of left foot  Other symptoms and signs involving the musculoskeletal  system     Problem List Patient Active Problem List   Diagnosis Date Noted   Nausea without vomiting 04/17/2020   History of adenomatous polyp of colon 01/16/2020   Fibrocystic breast disease (FCBD) 05/14/2019   Abdominal pain 01/15/2019   Diarrhea 11/09/2018   Depression, major, single episode, moderate (HCC) 10/05/2018   Hyperlipidemia associated with type 2 diabetes mellitus (HCC) 06/06/2018   Morbid obesity (HCC) 01/18/2018   Encounter for dental exam and cleaning w/o abnormal findings 01/03/2018   GERD (gastroesophageal reflux disease) 09/05/2017   Diverticulitis 05/04/2016   IBS (irritable bowel syndrome) 12/29/2015   Constipation 02/03/2015   Dyspepsia    Loss of weight    Decreased appetite 09/16/2014   Unintentional weight loss 09/16/2014   Diabetes type 2, uncontrolled (HCC) 06/27/2013   DVT of axillary vein, acute right (HCC) 11/24/2012   Type 2 diabetes mellitus with diabetic neuropathy, without long-term current use of insulin (HCC) 11/15/2012   Chest pain, unspecified 05/10/2011   Bradycardia 05/10/2011   HYPERCHOLESTEROLEMIA 08/29/2008   DEPRESSION 08/29/2008   Essential hypertension 08/29/2008   PEPTIC ULCER DISEASE 08/29/2008   HIATAL HERNIA 08/29/2008   HEPATIC CYST 08/29/2008   Osteoarthritis 08/29/2008   12:34 PM, 09/03/20 Wyman Songster PT, DPT Physical Therapist at [facility]   [place] [facility] [address] [place], [place], [address] Phone: [phone]   Fax:  [phone]  Name: [name] MRN: [id number] Date of Birth: March 16, 1946

## 2020-09-04 ENCOUNTER — Other Ambulatory Visit: Payer: Self-pay | Admitting: *Deleted

## 2020-09-04 DIAGNOSIS — M79673 Pain in unspecified foot: Secondary | ICD-10-CM

## 2020-09-04 NOTE — Progress Notes (Signed)
Called and discussed with pt. Pt verbalized understanding. Surgery on 2/2 and referral put in with a note that surgery was on 2/2 and she would need home health services after surgery

## 2020-09-08 ENCOUNTER — Telehealth: Payer: Self-pay | Admitting: Family Medicine

## 2020-09-08 NOTE — Telephone Encounter (Signed)
Called and discussed with pt that home health referral was put in on 1/6 and to let us know she has not heard back in a couple of weeks. Her surgery is 2/2 and pt needs home health after surgery. Pt verbalized understanding

## 2020-09-08 NOTE — Telephone Encounter (Signed)
Patient is returning call to nurse about  Getting help from home health to come into the home. She states spoke with Abigail Butts about this and she spoke with her insurance company.

## 2020-09-09 ENCOUNTER — Other Ambulatory Visit: Payer: Self-pay

## 2020-09-09 ENCOUNTER — Ambulatory Visit (HOSPITAL_COMMUNITY): Payer: Medicare HMO | Admitting: Physical Therapy

## 2020-09-09 DIAGNOSIS — R29898 Other symptoms and signs involving the musculoskeletal system: Secondary | ICD-10-CM | POA: Diagnosis not present

## 2020-09-09 DIAGNOSIS — M25572 Pain in left ankle and joints of left foot: Secondary | ICD-10-CM

## 2020-09-09 DIAGNOSIS — R2689 Other abnormalities of gait and mobility: Secondary | ICD-10-CM | POA: Diagnosis not present

## 2020-09-09 DIAGNOSIS — M6281 Muscle weakness (generalized): Secondary | ICD-10-CM

## 2020-09-09 NOTE — Patient Instructions (Signed)
Ankle Alphabet    Using left ankle and foot only, trace the letters of the alphabet. Perform A to Z.  Repeat _2___ times per set. Do __2__ sessions per day. Toe Raise (Sitting)    Raise toes, keeping heels on floor.  Repeat _10__ times per set. Do _2___ sessions per day.   Heel Raise (Sitting)    Raise heels, keeping toes on floor.  Repeat _10___ times per set.  Do __2__ sessions per day.   Plantar Fascia Stretch    Standing with only ball of left foot on stair, push heel down until stretch is felt through arch of foot. Hold _30_ seconds. Relax. Repeat _3_ times per set. Do _2__ sessions per day.

## 2020-09-09 NOTE — Therapy (Signed)
[facility] [address] [place], [place], [address] Phone: [phone]   Fax:  [phone]  Physical Therapy Treatment  Patient Details  Name: [name] MRN: [id number] Date of Birth: 03/03/1946 Referring Provider (PT): Ventura SellersMichael Price DPM   Encounter Date: 09/09/2020   PT End of Session - 09/09/20 1538    Visit Number 2    Number of Visits 8    Date for PT Re-Evaluation 10/01/20    Authorization Type [employer]    Authorization Time Period 8 visits approved 09/03/20-10/01/20 Berkley Harvey- auth # [id number]    Authorization - Visit Number 2    Authorization - Number of Visits 8    PT Start Time 1004    PT Stop Time 1050    PT Time Calculation (min) 46 min    Activity Tolerance Patient tolerated treatment well    Behavior [facility] for tasks assessed/performed           Past Medical History:  Diagnosis Date  . 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 Cyst on liver  . Depression   . Diabetes mellitus    controlled by diet  . Diverticulitis   . DVT of axillary vein, acute right (HCC) 11/24/2012   Dx on Jan 28, 2012.    . Fibrocystic breast disease   . Hepatic cyst   . Hypertension   . IBS (irritable bowel syndrome)   . Impaired glucose tolerance   . Menopause     Past Surgical History:  Procedure Laterality Date  . ABDOMINAL HYSTERECTOMY    . BACK SURGERY    . BREAST SURGERY  right   cyst removed  . CAST APPLICATION  02/14/2012   Procedure: CAST APPLICATION;  Surgeon: Vickki HearingStanley E Harrison, MD;  Location: [facility];  Service: Orthopedics;  Laterality: Right;  procedure room   . CHOLECYSTECTOMY    . COLONOSCOPY  12/04/2004   NUR:Few small diverticula at sigmoid colon/Small cecal polyp ablated   . COLONOSCOPY N/A 09/24/2014   SLF: small internal hemorrhoids  . COLONOSCOPY N/A 02/15/2020   Procedure: COLONOSCOPY;  Surgeon: Corbin Adeourk, Robert M, MD;  Location: [facility];  Service: Endoscopy;  Laterality: N/A;  8:30am  .  ESOPHAGOGASTRODUODENOSCOPY N/A 02/02/2016   Procedure: ESOPHAGOGASTRODUODENOSCOPY (EGD);  Surgeon: West BaliSandi L Fields, MD;  Location: [facility];  Service: Endoscopy;  Laterality: N/A;  930   . KNEE ARTHROCENTESIS  left  . POLYPECTOMY  02/15/2020   Procedure: POLYPECTOMY;  Surgeon: Corbin Adeourk, Robert M, MD;  Location: [facility];  Service: Endoscopy;;  . TUBAL LIGATION      There were no vitals filed for this visit.   Subjective Assessment - 09/09/20 1011    Subjective Pt states her ankle is sore and been hurting all weekend since her first appt as she has not been doing anything but keeping it propped up.  Currently 4/10 pain in Rt ankle.    Currently in Pain? Yes    Pain Score 4     Pain Location Ankle    Pain Orientation Right    Pain Descriptors / Indicators Aching;Sore;Burning    Pain Type Acute pain    Pain Onset In the past 7 days                             [facility] - 09/09/20 0001      Ambulation/Gait  Pre-Gait Activities demonstrated 1 limb NWB gait using walker and transfers      Ankle Exercises: Stretches   Plantar Fascia Stretch 3 reps;30 seconds    Plantar Fascia Stretch Limitations onto step and up into calf      Ankle Exercises: Seated   ABC's 1 rep    Heel Raises 10 reps    Toe Raise 10 reps    Other Seated Ankle Exercises windshield wipers with knee stabilized 10X                  PT Education - 09/09/20 1543    Education Details review of goals and POC moving forward.  Explained FOTO and completed; initiated HEP. Educated in [employer] transfers and gait    Person(s) Educated Patient    Methods Explanation;Demonstration;Tactile cues;Verbal cues;Handout    Comprehension Verbalized understanding;Returned demonstration;Verbal cues required;Tactile cues required;Need further instruction            PT Short Term Goals - 09/09/20 1012      PT SHORT TERM GOAL #1   Title Patient will be independent with HEP in  order to improve functional outcomes.    Time 2    Period Weeks    Status On-going    Target Date 09/17/20      PT SHORT TERM GOAL #2   Title Patient will report at least 25% improvement in symptoms for improved quality of life.    Time 2    Period Weeks    Status On-going    Target Date 09/17/20             PT Long Term Goals - 09/09/20 1013      PT LONG TERM GOAL #1   Title Patient will report at least 75% improvement in symptoms for improved quality of life.    Time 4    Period Weeks    Status On-going      PT LONG TERM GOAL #2   Title Patient will be able to ambulate at least 300 feet in 2MWT in order to demonstrate improved tolerance to activity.    Time 4    Period Weeks    Status On-going      PT LONG TERM GOAL #3   Title Patient will perform single limb stance on left/right lower extremity for 5 seconds to show improved balance for navigating stairs.    Time 4    Period Weeks    Status On-going                 Plan - 09/09/20 1547    Clinical Impression Statement Reviewed goals and POC moving foward.  Completed FOTO with resultant score of 63% functional or 37% limited.  Explained his score and FOTO system to patient.  Began LE AROM exericses for Lt ankle with most limitation and weakness in ER.  Pt able to complete all exercises given without pain or discomfort.  Given written HEP to include seated toe and heel raises, ankle alphabet and gastroc/PF stretch using step.  discussed and reviewed 1 LE NWB gait using RW and transfers.  Recommended getting front wheeled walkker vs stationary Legs.  Pt verbalized understanding.    Personal Factors and Comorbidities Age;Behavior Pattern;Social Background;Fitness;Past/Current Experience;Finances    Examination-Activity Limitations Transfers;Locomotion Level;Stairs;Squat    Examination-Participation Restrictions Cleaning;Meal Prep;Yard Work;Volunteer;Shop    Stability/Clinical Decision Making Stable/Uncomplicated     Rehab Potential Good    PT Frequency 2x / week    PT Duration 4  weeks    PT Treatment/Interventions ADLs/Self Care Home Management;Electrical Stimulation;Cryotherapy;Iontophoresis 4mg /ml Dexamethasone;Moist Heat;Traction;Ultrasound;Parrafin;Fluidtherapy;Contrast Bath;DME Instruction;Gait training;Stair training;Functional mobility training;Therapeutic activities;Therapeutic exercise;Balance training;Neuromuscular re-education;Patient/family education;Orthotic Fit/Training;Compression bandaging;Manual techniques;Dry needling;Energy conservation;Splinting;Taping;Joint Manipulations;Spinal Manipulations    PT Next Visit Plan Continue with Lt ankle DF mobility, ankle strenghtening, static and dynamic balance for ankle stability.    PT Home Exercise Plan initiate next session    Consulted and Agree with Plan of Care Patient           Patient will benefit from skilled therapeutic intervention in order to improve the following deficits and impairments:  Abnormal gait,Difficulty walking,Decreased range of motion,Decreased endurance,Decreased activity tolerance,Hypomobility,Improper body mechanics,Decreased mobility,Decreased strength  Visit Diagnosis: Other abnormalities of gait and mobility  Pain in left ankle and joints of left foot  Muscle weakness (generalized)  Other symptoms and signs involving the musculoskeletal system     Problem List Patient Active Problem List   Diagnosis Date Noted  . Nausea without vomiting 04/17/2020  . History of adenomatous polyp of colon 01/16/2020  . Fibrocystic breast disease (FCBD) 05/14/2019  . Abdominal pain 01/15/2019  . Diarrhea 11/09/2018  . Depression, major, single episode, moderate ([place]) 10/05/2018  . Hyperlipidemia associated with type 2 diabetes mellitus ([place]) 06/06/2018  . Morbid obesity ([place]) 01/18/2018  . Encounter for dental exam and cleaning w/o abnormal findings 01/03/2018  . GERD (gastroesophageal reflux disease) 09/05/2017  .  Diverticulitis 05/04/2016  . IBS (irritable bowel syndrome) 12/29/2015  . Constipation 02/03/2015  . Dyspepsia   . Loss of weight   . Decreased appetite 09/16/2014  . Unintentional weight loss 09/16/2014  . Diabetes type 2, uncontrolled ([place]) 06/27/2013  . DVT of axillary vein, acute right ([place]) 11/24/2012  . Type 2 diabetes mellitus with diabetic neuropathy, without long-term current use of insulin ([place]) 11/15/2012  . Chest pain, unspecified 05/10/2011  . Bradycardia 05/10/2011  . HYPERCHOLESTEROLEMIA 08/29/2008  . DEPRESSION 08/29/2008  . Essential hypertension 08/29/2008  . PEPTIC ULCER DISEASE 08/29/2008  . HIATAL HERNIA 08/29/2008  . HEPATIC CYST 08/29/2008  . Osteoarthritis 08/29/2008   Teena Irani, PTA/CLT [phone]  Teena Irani 09/09/2020, 4:01 PM  [facility] [address] [place], [name], [address] Phone: [phone]   Fax:  [phone]  Name: [name] MRN: [id number] Date of Birth: [birth year]

## 2020-09-11 ENCOUNTER — Encounter (HOSPITAL_COMMUNITY): Payer: Self-pay | Admitting: Physical Therapy

## 2020-09-11 ENCOUNTER — Other Ambulatory Visit: Payer: Self-pay

## 2020-09-11 ENCOUNTER — Ambulatory Visit (HOSPITAL_COMMUNITY): Payer: Medicare HMO | Admitting: Physical Therapy

## 2020-09-11 DIAGNOSIS — R29898 Other symptoms and signs involving the musculoskeletal system: Secondary | ICD-10-CM | POA: Diagnosis not present

## 2020-09-11 DIAGNOSIS — M6281 Muscle weakness (generalized): Secondary | ICD-10-CM | POA: Diagnosis not present

## 2020-09-11 DIAGNOSIS — R2689 Other abnormalities of gait and mobility: Secondary | ICD-10-CM | POA: Diagnosis not present

## 2020-09-11 DIAGNOSIS — M25572 Pain in left ankle and joints of left foot: Secondary | ICD-10-CM | POA: Diagnosis not present

## 2020-09-11 NOTE — Therapy (Signed)
[place], [name], [address] Phone: [phone]   Fax:  [phone]  Physical Therapy Treatment  Patient Details  Name: [name] MRN: [id number] Date of Birth: 1946-06-16 Referring Provider (PT): Hardie Pulley DPM   Encounter Date: 09/11/2020   PT End of Session - 09/11/20 0958    Visit Number 3    Number of Visits 8    Date for PT Re-Evaluation 10/01/20    Authorization Type [employer]    Authorization Time Period 8 visits approved 09/03/20-10/01/20 Josem Kaufmann # [id number]    Authorization - Visit Number 3    Authorization - Number of Visits 8    PT Start Time 1000    PT Stop Time 1040    PT Time Calculation (min) 40 min    Activity Tolerance Patient tolerated treatment well    Behavior During Therapy [facility] for tasks assessed/performed           Past Medical History:  Diagnosis Date  . Blood clot of artery under arm ([place])   . Cyst on liver  . Depression   . Diabetes mellitus    controlled by diet  . Diverticulitis   . DVT of axillary vein, acute right ([place]) 11/24/2012   Dx on Jan 28, 2012.    . Fibrocystic breast disease   . Hepatic cyst   . Hypertension   . IBS (irritable bowel syndrome)   . Impaired glucose tolerance   . Menopause     Past Surgical History:  Procedure Laterality Date  . ABDOMINAL HYSTERECTOMY    . BACK SURGERY    . BREAST SURGERY  right   cyst removed  . CAST APPLICATION  02/16/5092   Procedure: CAST APPLICATION;  Surgeon: Carole Civil, MD;  Location: [facility] OR[facility];  Service: Orthopedics;  Laterality: Right;  procedure room   . CHOLECYSTECTOMY    . COLONOSCOPY  12/04/2004   NUR:Few small diverticula at sigmoid colon/Small cecal polyp ablated   . COLONOSCOPY N/A 09/24/2014   SLF: small internal hemorrhoids  . COLONOSCOPY N/A 02/15/2020   Procedure: COLONOSCOPY;  Surgeon: [name], MD;  Location: [facility];  Service: Endoscopy;  Laterality: N/A;  8:30am  .  ESOPHAGOGASTRODUODENOSCOPY N/A 02/02/2016   Procedure: ESOPHAGOGASTRODUODENOSCOPY (EGD);  Surgeon: Danie Binder, MD;  Location: [facility];  Service: Endoscopy;  Laterality: N/A;  930   . KNEE ARTHROCENTESIS  left  . POLYPECTOMY  02/15/2020   Procedure: POLYPECTOMY;  Surgeon: [name], MD;  Location: [facility];  Service: Endoscopy;;  . TUBAL LIGATION      There were no vitals filed for this visit.   Subjective Assessment - 09/11/20 1000    Subjective Patient states she has been doing home exercises. She did not do the standing on step one because she did not want to go down steps.    Currently in Pain? No/denies    Pain Onset In the past 7 days                             [facility] Adult PT Treatment/Exercise - 09/11/20 0001      Ankle Exercises: Seated   Heel Raises 10 reps    Toe Raise 10 reps    BAPS Level 2;Sitting;10 reps   DF/PF, Inv/EV, CW/CCW   Other Seated Ankle Exercises windshield wipers with knee stabilized 2x1 minute    Other  Seated Ankle Exercises ankle inversion isometric with ball between feet 5x 10 second holds      Ankle Exercises: Stretches   Gastroc Stretch 3 reps;30 seconds   on edge of step   Other Stretch split stance calf stretch 2x 30 second holds bilateral      Ankle Exercises: Standing   Other Standing Ankle Exercises tandem stance 2x 30 seconds bilateral                  PT Education - 09/11/20 0959    Education Details Patient educated on HEP, exercise positioning and mechanics, dynamic ankle stability, performing some exercises out of brace    Person(s) Educated Patient    Methods Explanation;Demonstration;Handout    Comprehension Verbalized understanding;Returned demonstration            PT Short Term Goals - 09/09/20 1012      PT SHORT TERM GOAL #1   Title Patient will be independent with HEP in order to improve functional outcomes.    Time 2    Period Weeks    Status On-going    Target Date  09/17/20      PT SHORT TERM GOAL #2   Title Patient will report at least 25% improvement in symptoms for improved quality of life.    Time 2    Period Weeks    Status On-going    Target Date 09/17/20             PT Long Term Goals - 09/09/20 1013      PT LONG TERM GOAL #1   Title Patient will report at least 75% improvement in symptoms for improved quality of life.    Time 4    Period Weeks    Status On-going      PT LONG TERM GOAL #2   Title Patient will be able to ambulate at least 300 feet in 2MWT in order to demonstrate improved tolerance to activity.    Time 4    Period Weeks    Status On-going      PT LONG TERM GOAL #3   Title Patient will perform single limb stance on left/right lower extremity for 5 seconds to show improved balance for navigating stairs.    Time 4    Period Weeks    Status On-going                 Plan - 09/11/20 ID:[id number]    Clinical Impression Statement Reviewed HEP with patient to begin session and she requires min/no cueing for mechanics. Patient educated on importance of dynamic stability and ankle strength to reduce pain and improve ankle stability. Patient showing impaired medial/lateral motor control with BAPS requiring manual stabilization of knee to complete properly. She shows greatest difficulty with eversion. Patient with moderate sway with tandem stance with greater ankle instability on LLE requiring frequent HHA for balance. Patient will continue to benefit from skilled physical therapy in order to reduce impairment and improve function.    Personal Factors and Comorbidities Age;Behavior Pattern;Social Background;Fitness;Past/Current Experience;Finances    Examination-Activity Limitations Transfers;Locomotion Level;Stairs;Squat    Examination-Participation Restrictions Cleaning;Meal Prep;Yard Work;Volunteer;Shop    Stability/Clinical Decision Making Stable/Uncomplicated    Rehab Potential Good    PT Frequency 2x / week    PT  Duration 4 weeks    PT Treatment/Interventions ADLs/Self Care Home Management;Electrical Stimulation;Cryotherapy;Iontophoresis 4mg /ml Dexamethasone;Moist Heat;Traction;Ultrasound;Parrafin;Fluidtherapy;Contrast Bath;DME Instruction;Gait training;Stair training;Functional mobility training;Therapeutic activities;Therapeutic exercise;Balance training;Neuromuscular re-education;Patient/family education;Orthotic Fit/Training;Compression bandaging;Manual techniques;Dry needling;Energy conservation;Splinting;Taping;Joint Manipulations;Spinal Manipulations  PT Next Visit Plan Continue with Lt ankle DF mobility, ankle strenghtening, static and dynamic balance for ankle stability.    PT Home Exercise Plan ankle inversion/eversion with towel, heel and toe raises. 1/13 calf stretch at wall and on step, tandem stance, ankle inversion/eversion    Consulted and Agree with Plan of Care Patient           Patient will benefit from skilled therapeutic intervention in order to improve the following deficits and impairments:  Abnormal gait,Difficulty walking,Decreased range of motion,Decreased endurance,Decreased activity tolerance,Hypomobility,Improper body mechanics,Decreased mobility,Decreased strength  Visit Diagnosis: Other abnormalities of gait and mobility  Pain in left ankle and joints of left foot  Muscle weakness (generalized)  Other symptoms and signs involving the musculoskeletal system     Problem List Patient Active Problem List   Diagnosis Date Noted  . Nausea without vomiting 04/17/2020  . History of adenomatous polyp of colon 01/16/2020  . Fibrocystic breast disease ([facility]) 05/14/2019  . Abdominal pain 01/15/2019  . Diarrhea 11/09/2018  . Depression, major, single episode, moderate ([place]) 10/05/2018  . Hyperlipidemia associated with type 2 diabetes mellitus ([place]) 06/06/2018  . Morbid obesity ([place]) 01/18/2018  . Encounter for dental exam and cleaning w/o abnormal findings 01/03/2018  .  GERD (gastroesophageal reflux disease) 09/05/2017  . Diverticulitis 05/04/2016  . IBS (irritable bowel syndrome) 12/29/2015  . Constipation 02/03/2015  . Dyspepsia   . Loss of weight   . Decreased appetite 09/16/2014  . Unintentional weight loss 09/16/2014  . Diabetes type 2, uncontrolled ([place]) 06/27/2013  . DVT of axillary vein, acute right ([place]) 11/24/2012  . Type 2 diabetes mellitus with diabetic neuropathy, without long-term current use of insulin ([place]) 11/15/2012  . Chest pain, unspecified 05/10/2011  . Bradycardia 05/10/2011  . HYPERCHOLESTEROLEMIA 08/29/2008  . DEPRESSION 08/29/2008  . Essential hypertension 08/29/2008  . PEPTIC ULCER DISEASE 08/29/2008  . HIATAL HERNIA 08/29/2008  . HEPATIC CYST 08/29/2008  . Osteoarthritis 08/29/2008   10:48 AM, 09/11/20 Mearl Latin PT, DPT Physical Therapist at [facility] [place], [name], [address] Phone: [phone]   Fax:  [phone]  Name: [name] MRN: [id number] Date of Birth: Nov 20, 1945

## 2020-09-11 NOTE — Patient Instructions (Signed)
Access Code: [email or web address] URL: [email or web address] Date: 09/11/2020 Prepared by: Mitzi Hansen Isam Unrein  Exercises Ankle Inversion Eversion Towel Slide - 1 x daily - 7 x weekly - 3 reps - 1 minute hold Gastroc Stretch on Wall - 1 x daily - 7 x weekly - 3 reps - 30 seconds hold Standing Gastroc Stretch on Foam 1/2 Roll - 1 x daily - 7 x weekly - 3 reps - 30 second hold Standing Tandem Balance with Counter Support - 1 x daily - 7 x weekly - 3 reps - 30 seconds hold

## 2020-09-12 ENCOUNTER — Telehealth: Payer: Self-pay | Admitting: Podiatry

## 2020-09-12 DIAGNOSIS — M2041 Other hammer toe(s) (acquired), right foot: Secondary | ICD-10-CM

## 2020-09-12 DIAGNOSIS — Z9889 Other specified postprocedural states: Secondary | ICD-10-CM

## 2020-09-12 DIAGNOSIS — S93492A Sprain of other ligament of left ankle, initial encounter: Secondary | ICD-10-CM

## 2020-09-12 NOTE — Telephone Encounter (Signed)
-----   Message from [place] sent at 09/05/2020  8:30 AM EST ----- Regarding: equiment for after surgery Good Morning,   [name] is having surgery on 10/01/2020. She wanted to know if she will need a wheelchair for after surgery? If so, will you order it in Epic?  Thanks

## 2020-09-16 ENCOUNTER — Other Ambulatory Visit: Payer: Self-pay | Admitting: Podiatry

## 2020-09-16 ENCOUNTER — Other Ambulatory Visit: Payer: Self-pay | Admitting: Family Medicine

## 2020-09-16 ENCOUNTER — Telehealth: Payer: Self-pay | Admitting: *Deleted

## 2020-09-16 ENCOUNTER — Encounter (HOSPITAL_COMMUNITY): Payer: Medicare HMO | Admitting: Physical Therapy

## 2020-09-16 DIAGNOSIS — M2041 Other hammer toe(s) (acquired), right foot: Secondary | ICD-10-CM

## 2020-09-16 NOTE — Telephone Encounter (Addendum)
Patient is wanting to know when should she discontinue her blood thinners,having surgery on 12/29/20.Please advise.  Informed patient per Dr Eleanora Neighbor note,verbalized understanding.

## 2020-09-16 NOTE — Telephone Encounter (Signed)
Stop for 5 days prior to surgery and can resume it the day after.

## 2020-09-18 ENCOUNTER — Ambulatory Visit (HOSPITAL_COMMUNITY): Payer: Medicare HMO | Admitting: Physical Therapy

## 2020-09-18 ENCOUNTER — Other Ambulatory Visit: Payer: Self-pay

## 2020-09-18 ENCOUNTER — Encounter: Payer: Medicare HMO | Attending: Family Medicine | Admitting: Nutrition

## 2020-09-18 ENCOUNTER — Encounter: Payer: Self-pay | Admitting: Nutrition

## 2020-09-18 DIAGNOSIS — I10 Essential (primary) hypertension: Secondary | ICD-10-CM | POA: Diagnosis not present

## 2020-09-18 DIAGNOSIS — E7800 Pure hypercholesterolemia, unspecified: Secondary | ICD-10-CM | POA: Insufficient documentation

## 2020-09-18 DIAGNOSIS — E1140 Type 2 diabetes mellitus with diabetic neuropathy, unspecified: Secondary | ICD-10-CM | POA: Diagnosis not present

## 2020-09-18 NOTE — Progress Notes (Signed)
  Medical Nutrition Therapy:  Appt start time: 0930 end time:  1000  Assessment:  Primary concerns today: Diabetes Type 2, Obesity, Hyperlipidemia, HTN. Lives by herself. She is a widow. Lost her husband 2 years ago.  Scheduled to have foot surgery on Feb 2nd with [facility]. Changing: working on avoid the sweets and desserts. Appetite fair. Lost  14 lbs since August 2021. Feels much better. No longer on DM meds and other medications have been reduced. Working on drinking more water with lemon.  A1C down to 6.7% from 9%. Uses Utube for chair exercises.  FBS: 83-90 mg/dl.  Wt Readings from Last 3 Encounters:  08/15/20 227 lb (103 kg)  08/06/20 228 lb (103.4 kg)  07/04/20 228 lb 6.4 oz (103.6 kg)   Ht Readings from Last 3 Encounters:  08/15/20 5\' 5"  (1.651 m)  08/06/20 5\' 5"  (1.651 m)  06/18/20 5\' 4"  (1.626 m)   There is no height or weight on file to calculate BMI. @BMIFA @ Facility age limit for growth percentiles is 20 years. Facility age limit for growth percentiles is 20 years. CMP Latest Ref Rng & Units 08/01/2020 04/22/2020 03/07/2020  Glucose 65 - 99 mg/dL 100(H) 202(H) 145(H)  BUN 8 - 27 mg/dL 16 15 15   Creatinine 0.57 - 1.00 mg/dL 0.98 0.89 0.83  Sodium 134 - 144 mmol/L 143 139 138  Potassium 3.5 - 5.2 mmol/L 4.5 4.1 3.7  Chloride 96 - 106 mmol/L 105 98 99  CO2 20 - 29 mmol/L 23 29 30   Calcium 8.7 - 10.3 mg/dL 9.5 10.2 9.4  Total Protein 6.0 - 8.5 g/dL - - -  Total Bilirubin 0.0 - 1.2 mg/dL - - -  Alkaline Phos 39 - 117 IU/L - - -  AST 0 - 40 IU/L - - -  ALT 0 - 32 IU/L - - -   Lab Results  Component Value Date   HGBA1C 6.7 (H) 08/01/2020    Preferred Learning Style:  No preference indicated   Learning Readiness:    Ready  Change in progress   MEDICATIONS:   DIETARY INTAKE:    24-hr recall:  B ( AM): Oatmeal and 2 slices of bacon,  L)  Ham sandwich on wheat bread , pear, grapes , D) PB on rice cake,   Usual physical  activity:  Estimated energy needs: 1200  calories 135  g carbohydrates 90 g protein 42 g fat  Progress Towards Goal(s):  Resolved.   Nutritional Diagnosis:  NB-1.1 Food and nutrition-related knowledge deficit As related to Diabetes Type 2.  As evidenced by A1C 9%.    Intervention:  Meal planning, portion sizes and importance of not skipping meals..   Goals  Keep up a great job! Increase water intake to 5 bottles per day. Lose 2-3 lbs per month. Keep A1C  6.7% or less.  Teaching Method Utilized:  Visual Auditory Hands on  Handouts given during visit include:  The Plate Method   Meal Plan Card  Diabetes Instructions   Barriers to learning/adherence to lifestyle change: none  Demonstrated degree of understanding via:  Teach Back   Monitoring/Evaluation:  Dietary intake, exercise, , and body weight in 6 month(s).

## 2020-09-18 NOTE — Patient Instructions (Addendum)
Goals  Keep up a great job! Increase water intake to 5 bottles per day. Lose 2-3 lbs per month. Keep A1C  6.7% or less.

## 2020-09-22 ENCOUNTER — Other Ambulatory Visit: Payer: Self-pay

## 2020-09-22 ENCOUNTER — Encounter: Payer: Self-pay | Admitting: Family Medicine

## 2020-09-22 ENCOUNTER — Ambulatory Visit (INDEPENDENT_AMBULATORY_CARE_PROVIDER_SITE_OTHER): Payer: Medicare HMO | Admitting: Family Medicine

## 2020-09-22 ENCOUNTER — Ambulatory Visit: Payer: Medicare HMO | Admitting: Family Medicine

## 2020-09-22 VITALS — BP 128/66 | HR 66 | Temp 97.1°F | Ht 65.0 in | Wt 227.0 lb

## 2020-09-22 DIAGNOSIS — M79672 Pain in left foot: Secondary | ICD-10-CM

## 2020-09-22 NOTE — Progress Notes (Addendum)
   Subjective:    Patient ID: [name], female    DOB: May 09, 1946, 75 y.o.   MRN: [id number]  HPI  Patient arrives for a follow on blood pressure and pulse. Patient would also like her right foot checked to see if she stepped on something Patient will be having surgery soon Needed to have blood pressure and pulse check Denies any chest tightness pressure pain shortness of breath Patient very anxious because she lives by herself needs help Her daughter will be with her for 5 days  Review of Systems  Constitutional: Negative for activity change and appetite change.  HENT: Negative for congestion and rhinorrhea.   Respiratory: Negative for cough and shortness of breath.   Cardiovascular: Negative for chest pain and leg swelling.  Gastrointestinal: Negative for abdominal pain, nausea and vomiting.  Skin: Negative for color change.  Neurological: Negative for dizziness and weakness.  Psychiatric/Behavioral: Negative for agitation and confusion.       Objective:   Physical Exam Vitals reviewed.  Constitutional:      General: She is not in acute distress.    Appearance: She is well-nourished.  HENT:     Head: Normocephalic.  Cardiovascular:     Rate and Rhythm: Normal rate and regular rhythm.     Heart sounds: Normal heart sounds. No murmur heard.   Pulmonary:     Effort: Pulmonary effort is normal.     Breath sounds: Normal breath sounds.  Musculoskeletal:        General: No edema.  Lymphadenopathy:     Cervical: No cervical adenopathy.  Neurological:     Mental Status: She is alert.  Psychiatric:        Behavior: Behavior normal.           Assessment & Plan:  There is a very small spot on the bottom of the right foot I really doubt that this is any type of foreign body I would give it time if it does not improve over time then potentially podiatry to examine further  Left foot pain has surgery coming up next week will need a wheelchair as the primary  way to get around.  Because of her weight combined with her weakness and balance issues she is not able to use a walker or crutches.  Also [facility] states that they have already been contacted she has her surgery next Wednesday she will need someone to assist her with truly I do not know if insurance covers that or not Hopefully this can be worked through regarding her home health  Her blood pressure is very good pulse good Low risk for surgery  Follow-up in 3 months

## 2020-09-23 ENCOUNTER — Encounter (HOSPITAL_COMMUNITY): Payer: Self-pay | Admitting: Physical Therapy

## 2020-09-23 ENCOUNTER — Ambulatory Visit (HOSPITAL_COMMUNITY): Payer: Medicare HMO | Admitting: Physical Therapy

## 2020-09-23 DIAGNOSIS — R2689 Other abnormalities of gait and mobility: Secondary | ICD-10-CM | POA: Diagnosis not present

## 2020-09-23 DIAGNOSIS — M25572 Pain in left ankle and joints of left foot: Secondary | ICD-10-CM | POA: Diagnosis not present

## 2020-09-23 DIAGNOSIS — M6281 Muscle weakness (generalized): Secondary | ICD-10-CM

## 2020-09-23 DIAGNOSIS — R29898 Other symptoms and signs involving the musculoskeletal system: Secondary | ICD-10-CM

## 2020-09-23 NOTE — Therapy (Signed)
[facility] [address] [place], [name], [address] Phone: [phone]   Fax:  [phone]  Physical Therapy Treatment/ Discharge Summary  Patient Details  Name: [name] MRN: [id number] Date of Birth: [birth year] Referring Provider (PT): Hardie Pulley DPM   Encounter Date: 09/23/2020   PHYSICAL THERAPY DISCHARGE SUMMARY  Visits from Start of Care: 4  Current functional level related to goals / functional outcomes: See below   Remaining deficits: See below   Education / Equipment: See below  Plan: Patient agrees to discharge.  Patient goals were met. Patient is being discharged due to meeting the stated rehab goals.  ?????        PT End of Session - 09/23/20 1356    Visit Number 4    Number of Visits 8    Date for PT Re-Evaluation 10/01/20    Authorization Type [employer]    Authorization Time Period 8 visits approved 09/03/20-10/01/20 Josem Kaufmann # [id number]    Authorization - Visit Number 4    Authorization - Number of Visits 8    PT Start Time 6606    PT Stop Time 1420    PT Time Calculation (min) 24 min    Activity Tolerance Patient tolerated treatment well    Behavior During Therapy WFL for tasks assessed/performed           Past Medical History:  Diagnosis Date  . Blood clot of artery under arm ([place])   . Cyst on liver  . Depression   . Diabetes mellitus    controlled by diet  . Diverticulitis   . DVT of axillary vein, acute right ([place]) 11/24/2012   Dx on Jan 28, 2012.    . Fibrocystic breast disease   . Hepatic cyst   . Hypertension   . IBS (irritable bowel syndrome)   . Impaired glucose tolerance   . Menopause     Past Surgical History:  Procedure Laterality Date  . ABDOMINAL HYSTERECTOMY    . BACK SURGERY    . BREAST SURGERY  right   cyst removed  . CAST APPLICATION  10/28/6008   Procedure: CAST APPLICATION;  Surgeon: Carole Civil, MD;  Location: [facility];  Service: Orthopedics;   Laterality: Right;  procedure room   . CHOLECYSTECTOMY    . COLONOSCOPY  12/04/2004   NUR:Few small diverticula at sigmoid colon/Small cecal polyp ablated   . COLONOSCOPY N/A 09/24/2014   SLF: small internal hemorrhoids  . COLONOSCOPY N/A 02/15/2020   Procedure: COLONOSCOPY;  Surgeon: [name], MD;  Location: [facility];  Service: Endoscopy;  Laterality: N/A;  8:30am  . ESOPHAGOGASTRODUODENOSCOPY N/A 02/02/2016   Procedure: ESOPHAGOGASTRODUODENOSCOPY (EGD);  Surgeon: Danie Binder, MD;  Location: [facility];  Service: Endoscopy;  Laterality: N/A;  930   . KNEE ARTHROCENTESIS  left  . POLYPECTOMY  02/15/2020   Procedure: POLYPECTOMY;  Surgeon: [name], MD;  Location: [facility];  Service: Endoscopy;;  . TUBAL LIGATION      There were no vitals filed for this visit.   Subjective Assessment - 09/23/20 1355    Subjective Patient states she was snowed in for the last few weeks. She has been doing the exercises at home and at first it was sore but it has been getting better. Patient states she will have to get a COVID test and then have to quarentine. Patient states 80% improvement with PT intervention. She remains limited by intermittent ankle  pain.    Currently in Pain? No/denies   worst 1/10   Pain Onset In the past 7 days              [facility] PT Assessment - 09/23/20 0001      Assessment   Medical Diagnosis L ankle sprain of ATFL, ankle instability, high risk for falls    Referring Provider (PT) Hardie Pulley DPM    Onset Date/Surgical Date 02/28/20    Next MD Visit Surgery 10/01/20    Prior Therapy yes ankle      Precautions   Precautions Fall      Restrictions   Weight Bearing Restrictions No      Balance Screen   Has the patient fallen in the past 6 months No    Has the patient had a decrease in activity level because of a fear of falling?  No    Is the patient reluctant to leave their home because of a fear of falling?  No      Prior Function    Level of Independence Independent    Vocation Retired      Charity fundraiser Status Within Functional Limits for tasks assessed      Observation/Other Assessments   Observations ambulates with SPC      AROM   Left Ankle Dorsiflexion 3    Left Ankle Inversion 28    Left Ankle Eversion 26      Strength   Left Ankle Dorsiflexion 5/5    Left Ankle Inversion 5/5    Left Ankle Eversion 5/5      Ambulation/Gait   Ambulation/Gait Yes    Ambulation/Gait Assistance 7: Independent    Ambulation Distance (Feet) 330 Feet    Assistive device None    Gait Comments 2MWT, decreased L ankle DF/ early heel off but improved from initial evaluation      Balance   Balance Assessed Yes   SLS 6 seconds LLE; RLE 3 seconds                                PT Education - 09/23/20 1356    Education Details educated on continuing HEP, returning to PT if needed, progress made    Person(s) Educated Patient    Methods Explanation;Demonstration    Comprehension Verbalized understanding;Returned demonstration            PT Short Term Goals - 09/23/20 1405      PT SHORT TERM GOAL #1   Title Patient will be independent with HEP in order to improve functional outcomes.    Time 2    Period Weeks    Status Achieved    Target Date 09/17/20      PT SHORT TERM GOAL #2   Title Patient will report at least 25% improvement in symptoms for improved quality of life.    Time 2    Period Weeks    Status Achieved    Target Date 09/17/20             PT Long Term Goals - 09/23/20 1405      PT LONG TERM GOAL #1   Title Patient will report at least 75% improvement in symptoms for improved quality of life.    Time 4    Period Weeks    Status Achieved      PT LONG TERM GOAL #2   Title Patient will  be able to ambulate at least 300 feet in 2MWT in order to demonstrate improved tolerance to activity.    Time 4    Period Weeks    Status Achieved      PT LONG TERM GOAL  #3   Title Patient will perform single limb stance on left/right lower extremity for 5 seconds to show improved balance for navigating stairs.    Time 4    Period Weeks    Status Partially Met                 Plan - 09/23/20 1356    Clinical Impression Statement Patient has met 2/2 short term goals and 2/3 long term goals with remaining goal being partially met. Patient is showing ability to complete HEP and improvement in ankle symptoms, strength, ROM, balance, gait, and functional mobility. Remaining goal not fully met due to difficulty balancing on RLE. Patient remains limited by only low grade intermittent ankle symptoms. Educated patient on progress made and continuing HEP. Patient discharged from physical therapy at this time.    Personal Factors and Comorbidities Age;Behavior Pattern;Social Background;Fitness;Past/Current Experience;Finances    Examination-Activity Limitations Transfers;Locomotion Level;Stairs;Squat    Examination-Participation Restrictions Cleaning;Meal Prep;Yard Work;Volunteer;Shop    Stability/Clinical Decision Making Stable/Uncomplicated    Rehab Potential Good    PT Frequency --    PT Duration --    PT Treatment/Interventions ADLs/Self Care Home Management;Electrical Stimulation;Cryotherapy;Iontophoresis 55m/ml Dexamethasone;Moist Heat;Traction;Ultrasound;Parrafin;Fluidtherapy;Contrast Bath;DME Instruction;Gait training;Stair training;Functional mobility training;Therapeutic activities;Therapeutic exercise;Balance training;Neuromuscular re-education;Patient/family education;Orthotic Fit/Training;Compression bandaging;Manual techniques;Dry needling;Energy conservation;Splinting;Taping;Joint Manipulations;Spinal Manipulations    PT Next Visit Plan n/a    PT Home Exercise Plan ankle inversion/eversion with towel, heel and toe raises. 1/13 calf stretch at wall and on step, tandem stance, ankle inversion/eversion    Consulted and Agree with Plan of Care Patient            Patient will benefit from skilled therapeutic intervention in order to improve the following deficits and impairments:  Abnormal gait,Difficulty walking,Decreased range of motion,Decreased endurance,Decreased activity tolerance,Hypomobility,Improper body mechanics,Decreased mobility,Decreased strength  Visit Diagnosis: Other abnormalities of gait and mobility  Pain in left ankle and joints of left foot  Muscle weakness (generalized)  Other symptoms and signs involving the musculoskeletal system     Problem List Patient Active Problem List   Diagnosis Date Noted  . Nausea without vomiting 04/17/2020  . History of adenomatous polyp of colon 01/16/2020  . Fibrocystic breast disease ([facility]) 05/14/2019  . Abdominal pain 01/15/2019  . Diarrhea 11/09/2018  . Depression, major, single episode, moderate ([place] 10/05/2018  . Hyperlipidemia associated with type 2 diabetes mellitus ([facility] 06/06/2018  . Morbid obesity ([place] 01/18/2018  . Encounter for dental exam and cleaning w/o abnormal findings 01/03/2018  . GERD (gastroesophageal reflux disease) 09/05/2017  . Diverticulitis 05/04/2016  . IBS (irritable bowel syndrome) 12/29/2015  . Constipation 02/03/2015  . Dyspepsia   . Loss of weight   . Decreased appetite 09/16/2014  . Unintentional weight loss 09/16/2014  . Diabetes type 2, uncontrolled ([facility] 06/27/2013  . DVT of axillary vein, acute right ([facility] 11/24/2012  . Type 2 diabetes mellitus with diabetic neuropathy, without long-term current use of insulin ([facility] 11/15/2012  . Chest pain, unspecified 05/10/2011  . Bradycardia 05/10/2011  . HYPERCHOLESTEROLEMIA 08/29/2008  . DEPRESSION 08/29/2008  . Essential hypertension 08/29/2008  . PEPTIC ULCER DISEASE 08/29/2008  . HIATAL HERNIA 08/29/2008  . HEPATIC CYST 08/29/2008  . Osteoarthritis 08/29/2008    2:30 PM, 09/23/20 AMearl LatinPT, DPT  Physical Therapist at [facility] [place], [name], [address] Phone: [phone]   Fax:  [phone]  Name: [name] MRN: [id number] Date of Birth: 1946-03-30

## 2020-09-24 ENCOUNTER — Telehealth: Payer: Self-pay

## 2020-09-24 ENCOUNTER — Telehealth: Payer: Self-pay | Admitting: Podiatry

## 2020-09-24 NOTE — Telephone Encounter (Signed)
[facility] called and said a RX was sent over for a wheel chair and a walker and they do not take [employer] and it will need to be sent to another pharmacy

## 2020-09-24 NOTE — Telephone Encounter (Signed)
Called and discussed with pt. Pt states she will call insurance and then call us back to let us know where to send scripts.

## 2020-09-24 NOTE — Telephone Encounter (Signed)
[facility] does not provide wheelchairs or walkers [facility] Fax # [phone]

## 2020-09-24 NOTE — Telephone Encounter (Signed)
Orders and notes faxed to [facility] in [place].

## 2020-09-24 NOTE — Telephone Encounter (Signed)
Patient states her insurance prefers [facility]: [phone] or [facility] in [place]

## 2020-09-24 NOTE — Telephone Encounter (Signed)
DOS: 10/01/2020  Procedures: Ankle Stabilization Lt [phone] & [phone])  [employer] Medicare Effective From 08/31/2019 -  Deductible: $0 Out of Pocket: $3,900 CoInsurance: 100% Copay: $245  Per [employer] Website Prior Authorization is Not Required.

## 2020-09-25 ENCOUNTER — Encounter (HOSPITAL_COMMUNITY): Payer: Medicare HMO | Admitting: Physical Therapy

## 2020-09-29 ENCOUNTER — Telehealth: Payer: Self-pay | Admitting: *Deleted

## 2020-09-29 NOTE — Telephone Encounter (Signed)
[facility] about pt. They received rx for wheelchair and walker. Insurance will only cover one. They called and explained this to pt. Pt states she would rather have wheelchair because she has a walker at home she can use. They are requesting ov note be redone to state that pt will need wheelchair after foot surgery for all ambulation.   If you agree the note can be faxed to [phone]

## 2020-09-29 NOTE — Telephone Encounter (Signed)
I have read did her dictation from that office visit-the most recent 1-please fax as requested

## 2020-09-30 ENCOUNTER — Other Ambulatory Visit: Payer: Self-pay | Admitting: Podiatry

## 2020-09-30 ENCOUNTER — Encounter (HOSPITAL_COMMUNITY): Payer: Medicare HMO | Admitting: Physical Therapy

## 2020-09-30 DIAGNOSIS — Z9889 Other specified postprocedural states: Secondary | ICD-10-CM

## 2020-09-30 DIAGNOSIS — S93492A Sprain of other ligament of left ankle, initial encounter: Secondary | ICD-10-CM

## 2020-09-30 MED ORDER — ONDANSETRON HCL 4 MG PO TABS: 4.0000 mg | ORAL_TABLET | Freq: Three times a day (TID) | ORAL | 0 refills | Status: DC | PRN

## 2020-09-30 MED ORDER — CLINDAMYCIN HCL 150 MG PO CAPS: 150.0000 mg | ORAL_CAPSULE | Freq: Two times a day (BID) | ORAL | 0 refills | Status: DC

## 2020-09-30 MED ORDER — OXYCODONE-ACETAMINOPHEN 5-325 MG PO TABS: 1.0000 | ORAL_TABLET | ORAL | 0 refills | Status: DC | PRN

## 2020-09-30 NOTE — Progress Notes (Signed)
Rx sent to pharmacy. All residual questions about surgery answered. Patient confirms she has stopped her blood thinner.

## 2020-10-01 DIAGNOSIS — M25572 Pain in left ankle and joints of left foot: Secondary | ICD-10-CM | POA: Diagnosis not present

## 2020-10-01 DIAGNOSIS — M24272 Disorder of ligament, left ankle: Secondary | ICD-10-CM | POA: Diagnosis not present

## 2020-10-01 DIAGNOSIS — S93492A Sprain of other ligament of left ankle, initial encounter: Secondary | ICD-10-CM | POA: Diagnosis not present

## 2020-10-01 DIAGNOSIS — M25372 Other instability, left ankle: Secondary | ICD-10-CM | POA: Diagnosis not present

## 2020-10-02 ENCOUNTER — Encounter (HOSPITAL_COMMUNITY): Payer: Medicare HMO | Admitting: Physical Therapy

## 2020-10-02 ENCOUNTER — Telehealth: Payer: Self-pay

## 2020-10-02 NOTE — Telephone Encounter (Signed)
Pt wanted to let Dr Nicki Reaper know that she is home from the hospital from surgery

## 2020-10-03 ENCOUNTER — Telehealth: Payer: Self-pay | Admitting: Podiatry

## 2020-10-03 NOTE — Telephone Encounter (Signed)
Called patient for post-op check. Doing well post-operatively. Nerve block still in effect. Denies issues. Advised to call should issues occur.

## 2020-10-06 ENCOUNTER — Ambulatory Visit: Payer: Medicare HMO | Admitting: Family Medicine

## 2020-10-07 ENCOUNTER — Other Ambulatory Visit: Payer: Self-pay

## 2020-10-07 ENCOUNTER — Other Ambulatory Visit: Payer: Self-pay | Admitting: Podiatrist

## 2020-10-07 ENCOUNTER — Ambulatory Visit (INDEPENDENT_AMBULATORY_CARE_PROVIDER_SITE_OTHER): Payer: Medicare HMO

## 2020-10-07 ENCOUNTER — Encounter: Payer: Medicare HMO | Admitting: Podiatry

## 2020-10-07 ENCOUNTER — Ambulatory Visit (INDEPENDENT_AMBULATORY_CARE_PROVIDER_SITE_OTHER): Payer: Medicare HMO | Admitting: Podiatrist

## 2020-10-07 DIAGNOSIS — S93492A Sprain of other ligament of left ankle, initial encounter: Secondary | ICD-10-CM | POA: Diagnosis not present

## 2020-10-07 DIAGNOSIS — Z9889 Other specified postprocedural states: Secondary | ICD-10-CM

## 2020-10-13 NOTE — Progress Notes (Signed)
Chief Complaint  Patient presents with  . Routine Post Op    POV#1 -pt denies N/V/F/Ch -pt states," pain has been up and down; 5-6/10." - dressing clean and intact tx: percocet, boot, abx by mouth and elevation      Subjective: Patient presents today1 week status post foot surgery of the left ankle- ATFL repair/ augmentation.   Patient denies nausea, vomiting, fevers, chills or night sweats.  Denies calf pain or tenderness to the operative side. She relates she has been using a wheelchair for ambulation.  She has also been wearing the boot as instructed and is using her percocet sparingly.   Objective:  Neurovascular status is intact with palpable pedal pulses DP and PT at 2+ out of 4 left foot.  Negative homans sign noted.  Neurological sensation is intact and unchanged as per prior to surgery. Excellent appearance of the postoperative ankle is noted.  Dressing is intact upon presenting to the office and once removed sutures are noted to be in place with incision site well coapted.  Minimal swelling present.   Xrays of the left ankle  good alignment and positon at the ankle joint.  Staples in place on the skin.    Assessment: Status post left ankle ATFL repair  Plan:  I redressed the foot and ankle with a dry, sterile and compressive dressing.  I instructed her to start doing some light non-weight bearing ankle exercises/ abc's while elevating her foot at home.  She will return in a week to see Dr. March Rummage and will call sooner if any questions arise.

## 2020-10-14 ENCOUNTER — Other Ambulatory Visit: Payer: Self-pay

## 2020-10-14 ENCOUNTER — Ambulatory Visit (INDEPENDENT_AMBULATORY_CARE_PROVIDER_SITE_OTHER): Payer: Medicare HMO | Admitting: Podiatry

## 2020-10-14 DIAGNOSIS — S93492A Sprain of other ligament of left ankle, initial encounter: Secondary | ICD-10-CM

## 2020-10-14 NOTE — Progress Notes (Signed)
  Subjective:  Patient ID: [name], female    DOB: 1945/11/21,  MRN: [id number]  Chief Complaint  Patient presents with  . Routine Post Op    POV #2 DOS 10/01/2020 LT ANKLE REPAIR & STABILIZATION OF LATERAL ANKLE LIGAMENTS. Pt. Has no complaints. No N/V, fever and chills.     DOS: 10/01/20 Procedure: Left ankle lateral ankle stabilization left  75 y.o. female presents with the above complaint. History confirmed with patient. Doing ok. Pain controlled. Reports compliance with NWB restrictions. Denies new issues.  Objective:  Physical Exam: tenderness at the surgical site, local edema noted and calf supple, nontender. Incision: healing well, no significant drainage, no dehiscence, no significant erythema  Assessment:   1. Sprain of anterior talofibular ligament of left ankle, initial encounter     Plan:  Patient was evaluated and treated and all questions answered.  Post-operative State -Sutures removed -Ok to start showering at this time. Advised they cannot soak. -Dressing applied consisting of sterile gauze, kerlix and ACE bandage -NWB with wheelchair/walker  -Start PT. Order placed.  Return in about 2 weeks (around 10/28/2020) for Post-Op (No XRs).

## 2020-10-15 ENCOUNTER — Ambulatory Visit: Payer: Medicare HMO | Admitting: Nurse Practitioner

## 2020-10-17 ENCOUNTER — Other Ambulatory Visit: Payer: Self-pay | Admitting: Podiatrist

## 2020-10-17 DIAGNOSIS — S93492A Sprain of other ligament of left ankle, initial encounter: Secondary | ICD-10-CM

## 2020-10-22 ENCOUNTER — Telehealth: Payer: Self-pay | Admitting: Podiatry

## 2020-10-22 NOTE — Telephone Encounter (Signed)
Do either one of you know about the referral and if it was faxed. I am not sure who was working with Dr. March Rummage this day. Thanks!

## 2020-10-22 NOTE — Telephone Encounter (Signed)
Patient called to follow up on order for home health PT, stated she was told to call in if she wasn't scheduled within week, Please Advise

## 2020-10-23 NOTE — Telephone Encounter (Signed)
I do not know about the referral since I worked with Dr. March Rummage in the morning and pt was seen in the afternoon. Thank you

## 2020-10-24 ENCOUNTER — Telehealth: Payer: Self-pay | Admitting: Podiatry

## 2020-10-24 NOTE — Telephone Encounter (Signed)
Patient has called in requesting referral for in home PT, Please Advise

## 2020-10-24 NOTE — Telephone Encounter (Signed)
Please talk to Dr. March Rummage Monday about this. I cannot order PT as there is no note yet and I don't want to order something incorrectly.

## 2020-10-28 ENCOUNTER — Ambulatory Visit (INDEPENDENT_AMBULATORY_CARE_PROVIDER_SITE_OTHER): Payer: Medicare HMO | Admitting: Podiatry

## 2020-10-28 ENCOUNTER — Other Ambulatory Visit: Payer: Self-pay

## 2020-10-28 DIAGNOSIS — Z9889 Other specified postprocedural states: Secondary | ICD-10-CM

## 2020-10-28 DIAGNOSIS — S93492A Sprain of other ligament of left ankle, initial encounter: Secondary | ICD-10-CM

## 2020-10-28 NOTE — Progress Notes (Signed)
  Subjective:  Patient ID: [name], female    DOB: March 17, 1946,  MRN: [id number]  Chief Complaint  Patient presents with  . Routine Post Op    POV -pt denies N/V/F/Ch -pt states," every now and than w a pulling, but less pain." -per pt no pain at all - no redness -w swelling no drainage tx: neosporin and bandaid    DOS: 10/01/20 Procedure: Left ankle ATFL repair/augmentation  75 y.o. female presents with the above complaint. History confirmed with patient.  Objective:  Physical Exam: tenderness at the surgical site, local edema noted and calf supple, nontender. Incision: healing well, no significant drainage, no dehiscence, no significant erythema   Assessment:   1. Sprain of anterior talofibular ligament of left ankle, initial encounter   2. Post-operative state     Plan:  Patient was evaluated and treated and all questions answered.  Post-operative State -Staples removed -Steri-strips applied to the incision -Ok to start showering at this time. Advised they cannot soak. -Awaiting PT. Will need Home PT given how difficult it is to make it outside the home  No follow-ups on file.

## 2020-10-29 ENCOUNTER — Telehealth: Payer: Self-pay | Admitting: Podiatry

## 2020-10-29 ENCOUNTER — Telehealth: Payer: Self-pay | Admitting: *Deleted

## 2020-10-29 NOTE — Telephone Encounter (Signed)
Patient called in regarding home health care, I looked through notes and the only thing I saw regarding order was that [facility] has accepted her case but wasn't able to find any contact information but patient mentioned there hasn't been any contact from company either. The surgery was back on 10/01/20 and patient is concerned about starting PT as soon as possible, Please Advise

## 2020-10-30 ENCOUNTER — Telehealth: Payer: Self-pay

## 2020-10-30 ENCOUNTER — Telehealth: Payer: Self-pay | Admitting: Podiatry

## 2020-10-30 NOTE — Telephone Encounter (Signed)
Entered in error

## 2020-10-30 NOTE — Telephone Encounter (Signed)
Faxed Patient's demographic, insurance, last office visit note, and order form to [facility] for PT service.  Fax [phone]

## 2020-10-30 NOTE — Telephone Encounter (Signed)
[facility] called back stating they wouldn't be of any service to patient because they don't travel to [place]. Also, the order that was sent over to them stated Patient was established with [facility], Please Advise

## 2020-10-30 NOTE — Telephone Encounter (Signed)
-----   Message from Arturo Morton, RN sent at 10/29/2020 12:46 PM EST ----- [facility] will accept her insurance. Fax number [phone] ----- Message ----- From: Aquilla Solian, Assunta Found, CMA Sent: 10/29/2020  10:39 AM EST To: Arturo Morton, RN  HI Barbaraann Rondo, Dr. March Rummage wants to find a [place] agency for pt that would do home PT, how do I look for that, or can I choose any? Need help, thank you

## 2020-10-30 NOTE — Telephone Encounter (Signed)
Meghan from [facility] called stating they would like for the referral for physical therapy to be re sent if you're planning to use their services. Please advise.

## 2020-10-30 NOTE — Addendum Note (Signed)
Addended by: Hardie Pulley on: 10/30/2020 01:35 PM   Modules accepted: Orders

## 2020-10-31 ENCOUNTER — Telehealth: Payer: Self-pay | Admitting: Podiatry

## 2020-10-31 NOTE — Telephone Encounter (Signed)
Glenda calling back to inform Dr. March Rummage that due to staffing [facility] would not be able to provide services to patient.

## 2020-10-31 NOTE — Progress Notes (Signed)
Re-faxed pt's demo, insurance, order sheet, insurance, and LOV notes to [facility] to fax # [phone]

## 2020-10-31 NOTE — Telephone Encounter (Signed)
Calling to inform Dr. March Rummage that some of the information is missing from the outgoing referral for patient regarding physical therapy and home health. Please refax referral or call back at [phone](ext [phone]). Holley Raring states that 7 of the 9 pages of the referral are missing

## 2020-10-31 NOTE — Telephone Encounter (Signed)
[facility]enda calling back, stated that Dx code and insurance information are still missing from referral.

## 2020-10-31 NOTE — Telephone Encounter (Signed)
Faxed to [facility] pt's demographic, order sheet, insurance, last office visit notes to fax # [phone]

## 2020-10-31 NOTE — Telephone Encounter (Signed)
Patient called regarding referral to [facility], Patient stated she has been waiting for referral for some time and unable to do things for herself, Please [name] [phone] [facility]

## 2020-11-03 NOTE — Telephone Encounter (Signed)
Patient calling to inform Dr. March Rummage that [facility] did not call to inform her that they would not be able to provide services. She wants to know why she was denied the services referred for her. Please advise and give patient a call.

## 2020-11-04 ENCOUNTER — Telehealth: Payer: Self-pay | Admitting: Podiatry

## 2020-11-04 NOTE — Telephone Encounter (Signed)
[name] calling again to speak with Dr March Rummage directly in regards to patient and why she can not receive services referred to her. Patient stated she has been needing services since she had surgery in January. Please advise or call Megan ASAP. Contact is in previous encounters.

## 2020-11-04 NOTE — Telephone Encounter (Signed)
Tried calling patient unable to get through.  No message box was set up.

## 2020-11-06 DIAGNOSIS — K589 Irritable bowel syndrome without diarrhea: Secondary | ICD-10-CM | POA: Diagnosis not present

## 2020-11-06 DIAGNOSIS — Z86718 Personal history of other venous thrombosis and embolism: Secondary | ICD-10-CM | POA: Diagnosis not present

## 2020-11-06 DIAGNOSIS — Z9181 History of falling: Secondary | ICD-10-CM | POA: Diagnosis not present

## 2020-11-06 DIAGNOSIS — S93492D Sprain of other ligament of left ankle, subsequent encounter: Secondary | ICD-10-CM | POA: Diagnosis not present

## 2020-11-06 DIAGNOSIS — Z7902 Long term (current) use of antithrombotics/antiplatelets: Secondary | ICD-10-CM | POA: Diagnosis not present

## 2020-11-06 DIAGNOSIS — E119 Type 2 diabetes mellitus without complications: Secondary | ICD-10-CM | POA: Diagnosis not present

## 2020-11-06 DIAGNOSIS — I10 Essential (primary) hypertension: Secondary | ICD-10-CM | POA: Diagnosis not present

## 2020-11-07 ENCOUNTER — Telehealth: Payer: Self-pay | Admitting: *Deleted

## 2020-11-07 NOTE — Telephone Encounter (Signed)
error 

## 2020-11-07 NOTE — Telephone Encounter (Addendum)
Kenney Houseman w/ [facility] is calling for clarification on PT order (especially weight bearing training and instructions and what that means) just received. She wants to know if they can start patient with active range of motion first and progress to weight bearing as tolerated since home health order had been delayed?  She would also like to see patient 2 times a week for 4 weeks,1 time a week for 4 weeks( strengthening exercises, range of motion,gait training). Please advise.

## 2020-11-07 NOTE — Telephone Encounter (Signed)
Returned call to [facility] w/ [facility] and gave instructions per Dr Jodelle Red range of motion exercises for 1 week ,Active range of motion for 1 week, start protected weight bearing in boot.She verbalized understanding.

## 2020-11-10 DIAGNOSIS — I10 Essential (primary) hypertension: Secondary | ICD-10-CM | POA: Diagnosis not present

## 2020-11-10 DIAGNOSIS — Z86718 Personal history of other venous thrombosis and embolism: Secondary | ICD-10-CM | POA: Diagnosis not present

## 2020-11-10 DIAGNOSIS — S93492D Sprain of other ligament of left ankle, subsequent encounter: Secondary | ICD-10-CM | POA: Diagnosis not present

## 2020-11-10 DIAGNOSIS — E119 Type 2 diabetes mellitus without complications: Secondary | ICD-10-CM | POA: Diagnosis not present

## 2020-11-10 DIAGNOSIS — K589 Irritable bowel syndrome without diarrhea: Secondary | ICD-10-CM | POA: Diagnosis not present

## 2020-11-10 DIAGNOSIS — Z9181 History of falling: Secondary | ICD-10-CM | POA: Diagnosis not present

## 2020-11-10 DIAGNOSIS — Z7902 Long term (current) use of antithrombotics/antiplatelets: Secondary | ICD-10-CM | POA: Diagnosis not present

## 2020-11-13 DIAGNOSIS — Z9181 History of falling: Secondary | ICD-10-CM | POA: Diagnosis not present

## 2020-11-13 DIAGNOSIS — Z7902 Long term (current) use of antithrombotics/antiplatelets: Secondary | ICD-10-CM | POA: Diagnosis not present

## 2020-11-13 DIAGNOSIS — S93492D Sprain of other ligament of left ankle, subsequent encounter: Secondary | ICD-10-CM | POA: Diagnosis not present

## 2020-11-13 DIAGNOSIS — I10 Essential (primary) hypertension: Secondary | ICD-10-CM | POA: Diagnosis not present

## 2020-11-13 DIAGNOSIS — Z86718 Personal history of other venous thrombosis and embolism: Secondary | ICD-10-CM | POA: Diagnosis not present

## 2020-11-13 DIAGNOSIS — E119 Type 2 diabetes mellitus without complications: Secondary | ICD-10-CM | POA: Diagnosis not present

## 2020-11-13 DIAGNOSIS — K589 Irritable bowel syndrome without diarrhea: Secondary | ICD-10-CM | POA: Diagnosis not present

## 2020-11-17 DIAGNOSIS — Z7902 Long term (current) use of antithrombotics/antiplatelets: Secondary | ICD-10-CM | POA: Diagnosis not present

## 2020-11-17 DIAGNOSIS — I10 Essential (primary) hypertension: Secondary | ICD-10-CM | POA: Diagnosis not present

## 2020-11-17 DIAGNOSIS — S93492D Sprain of other ligament of left ankle, subsequent encounter: Secondary | ICD-10-CM | POA: Diagnosis not present

## 2020-11-17 DIAGNOSIS — Z9181 History of falling: Secondary | ICD-10-CM | POA: Diagnosis not present

## 2020-11-17 DIAGNOSIS — K589 Irritable bowel syndrome without diarrhea: Secondary | ICD-10-CM | POA: Diagnosis not present

## 2020-11-17 DIAGNOSIS — E119 Type 2 diabetes mellitus without complications: Secondary | ICD-10-CM | POA: Diagnosis not present

## 2020-11-17 DIAGNOSIS — Z86718 Personal history of other venous thrombosis and embolism: Secondary | ICD-10-CM | POA: Diagnosis not present

## 2020-11-18 ENCOUNTER — Other Ambulatory Visit: Payer: Self-pay

## 2020-11-18 ENCOUNTER — Ambulatory Visit (INDEPENDENT_AMBULATORY_CARE_PROVIDER_SITE_OTHER): Payer: Medicare HMO | Admitting: Podiatry

## 2020-11-18 DIAGNOSIS — S93492D Sprain of other ligament of left ankle, subsequent encounter: Secondary | ICD-10-CM | POA: Diagnosis not present

## 2020-11-18 NOTE — Progress Notes (Signed)
  Subjective:  Patient ID: RAFAELLA KOLE, female    DOB: 21-Apr-1946,  MRN: [id number]  Chief Complaint  Patient presents with  . Routine Post Op    POV -pt deneis N/V/F/Ch -pt states," seems to be doing good with PT." Tx: home PT, boot, wheelchair, and icing -w/ more swelling -w/ burning but no pain/numbness/tingling -per pt yesterday she added a little weight to her foot with PT and she did fine  . Diabetes    FBS: 87 a1C: 7   DOS: 10/01/20 Procedure: Left ankle ATFL repair/augmentation  75 y.o. female presents with the above complaint. History confirmed with patient.  Objective:  Physical Exam: mild peri-incisional tenderness. Good ankle strength and ROM.  Incision: healed.  Assessment:   1. Sprain of anterior talofibular ligament of left ankle, subsequent encounter    Plan:  Patient was evaluated and treated and all questions answered.  Post-operative State -Doing very well. At this point she can continue WB with PT. Will transition her to an ankle stabilizing brace. She is between sizes and I am concerned that once her swelling comes down this will not fit well. We did dispense two braces to accomodate for swelling. -Continue WB with PT  Return in about 1 month (around 12/19/2020) for Post-Op (No XRs).

## 2020-11-21 DIAGNOSIS — Z7902 Long term (current) use of antithrombotics/antiplatelets: Secondary | ICD-10-CM | POA: Diagnosis not present

## 2020-11-21 DIAGNOSIS — I10 Essential (primary) hypertension: Secondary | ICD-10-CM | POA: Diagnosis not present

## 2020-11-21 DIAGNOSIS — Z9181 History of falling: Secondary | ICD-10-CM | POA: Diagnosis not present

## 2020-11-21 DIAGNOSIS — E119 Type 2 diabetes mellitus without complications: Secondary | ICD-10-CM | POA: Diagnosis not present

## 2020-11-21 DIAGNOSIS — Z86718 Personal history of other venous thrombosis and embolism: Secondary | ICD-10-CM | POA: Diagnosis not present

## 2020-11-21 DIAGNOSIS — K589 Irritable bowel syndrome without diarrhea: Secondary | ICD-10-CM | POA: Diagnosis not present

## 2020-11-21 DIAGNOSIS — S93492D Sprain of other ligament of left ankle, subsequent encounter: Secondary | ICD-10-CM | POA: Diagnosis not present

## 2020-11-25 ENCOUNTER — Telehealth: Payer: Self-pay | Admitting: Podiatry

## 2020-11-25 NOTE — Telephone Encounter (Signed)
Calling to inform Dr. March Rummage that patient has started physical therapy. Has faxed over home health care order and is waiting for you to sign and fax it back. Please give [name] a call directly if there are questions about the order.

## 2020-11-26 DIAGNOSIS — Z7902 Long term (current) use of antithrombotics/antiplatelets: Secondary | ICD-10-CM | POA: Diagnosis not present

## 2020-11-26 DIAGNOSIS — K589 Irritable bowel syndrome without diarrhea: Secondary | ICD-10-CM | POA: Diagnosis not present

## 2020-11-26 DIAGNOSIS — I10 Essential (primary) hypertension: Secondary | ICD-10-CM | POA: Diagnosis not present

## 2020-11-26 DIAGNOSIS — Z9181 History of falling: Secondary | ICD-10-CM | POA: Diagnosis not present

## 2020-11-26 DIAGNOSIS — S93492D Sprain of other ligament of left ankle, subsequent encounter: Secondary | ICD-10-CM | POA: Diagnosis not present

## 2020-11-26 DIAGNOSIS — E119 Type 2 diabetes mellitus without complications: Secondary | ICD-10-CM | POA: Diagnosis not present

## 2020-11-26 DIAGNOSIS — Z86718 Personal history of other venous thrombosis and embolism: Secondary | ICD-10-CM | POA: Diagnosis not present

## 2020-11-27 ENCOUNTER — Ambulatory Visit: Payer: Medicare HMO | Admitting: Nurse Practitioner

## 2020-12-02 ENCOUNTER — Telehealth: Payer: Self-pay | Admitting: Podiatry

## 2020-12-02 ENCOUNTER — Other Ambulatory Visit: Payer: Self-pay | Admitting: Cardiology

## 2020-12-02 DIAGNOSIS — S93492D Sprain of other ligament of left ankle, subsequent encounter: Secondary | ICD-10-CM | POA: Diagnosis not present

## 2020-12-02 DIAGNOSIS — Z86718 Personal history of other venous thrombosis and embolism: Secondary | ICD-10-CM | POA: Diagnosis not present

## 2020-12-02 DIAGNOSIS — Z9181 History of falling: Secondary | ICD-10-CM | POA: Diagnosis not present

## 2020-12-02 DIAGNOSIS — E119 Type 2 diabetes mellitus without complications: Secondary | ICD-10-CM | POA: Diagnosis not present

## 2020-12-02 DIAGNOSIS — Z7902 Long term (current) use of antithrombotics/antiplatelets: Secondary | ICD-10-CM | POA: Diagnosis not present

## 2020-12-02 DIAGNOSIS — I10 Essential (primary) hypertension: Secondary | ICD-10-CM | POA: Diagnosis not present

## 2020-12-02 DIAGNOSIS — K589 Irritable bowel syndrome without diarrhea: Secondary | ICD-10-CM | POA: Diagnosis not present

## 2020-12-02 NOTE — Telephone Encounter (Signed)
[facility] called and stated they faxed over an order to be signed. Its a Public affairs consultant and plan of care. [facility] sent the first attempt on 11/06/2020. Another one was sent yesterday on 12/01/2020. Mariann Laster say  that the certification is outstanding and needs a signature and a date ASAP.

## 2020-12-02 NOTE — Telephone Encounter (Signed)
All orders were signed today and should be queued for fax back

## 2020-12-02 NOTE — Telephone Encounter (Signed)
Thank you :)

## 2020-12-04 DIAGNOSIS — Z9181 History of falling: Secondary | ICD-10-CM | POA: Diagnosis not present

## 2020-12-04 DIAGNOSIS — K589 Irritable bowel syndrome without diarrhea: Secondary | ICD-10-CM | POA: Diagnosis not present

## 2020-12-04 DIAGNOSIS — Z86718 Personal history of other venous thrombosis and embolism: Secondary | ICD-10-CM | POA: Diagnosis not present

## 2020-12-04 DIAGNOSIS — I10 Essential (primary) hypertension: Secondary | ICD-10-CM | POA: Diagnosis not present

## 2020-12-04 DIAGNOSIS — S93492D Sprain of other ligament of left ankle, subsequent encounter: Secondary | ICD-10-CM | POA: Diagnosis not present

## 2020-12-04 DIAGNOSIS — E119 Type 2 diabetes mellitus without complications: Secondary | ICD-10-CM | POA: Diagnosis not present

## 2020-12-04 DIAGNOSIS — Z7902 Long term (current) use of antithrombotics/antiplatelets: Secondary | ICD-10-CM | POA: Diagnosis not present

## 2020-12-09 DIAGNOSIS — S93492D Sprain of other ligament of left ankle, subsequent encounter: Secondary | ICD-10-CM | POA: Diagnosis not present

## 2020-12-09 DIAGNOSIS — I10 Essential (primary) hypertension: Secondary | ICD-10-CM | POA: Diagnosis not present

## 2020-12-09 DIAGNOSIS — Z86718 Personal history of other venous thrombosis and embolism: Secondary | ICD-10-CM | POA: Diagnosis not present

## 2020-12-09 DIAGNOSIS — K589 Irritable bowel syndrome without diarrhea: Secondary | ICD-10-CM | POA: Diagnosis not present

## 2020-12-09 DIAGNOSIS — E119 Type 2 diabetes mellitus without complications: Secondary | ICD-10-CM | POA: Diagnosis not present

## 2020-12-09 DIAGNOSIS — Z9181 History of falling: Secondary | ICD-10-CM | POA: Diagnosis not present

## 2020-12-09 DIAGNOSIS — Z7902 Long term (current) use of antithrombotics/antiplatelets: Secondary | ICD-10-CM | POA: Diagnosis not present

## 2020-12-16 DIAGNOSIS — I10 Essential (primary) hypertension: Secondary | ICD-10-CM | POA: Diagnosis not present

## 2020-12-16 DIAGNOSIS — Z86718 Personal history of other venous thrombosis and embolism: Secondary | ICD-10-CM | POA: Diagnosis not present

## 2020-12-16 DIAGNOSIS — E119 Type 2 diabetes mellitus without complications: Secondary | ICD-10-CM | POA: Diagnosis not present

## 2020-12-16 DIAGNOSIS — K589 Irritable bowel syndrome without diarrhea: Secondary | ICD-10-CM | POA: Diagnosis not present

## 2020-12-16 DIAGNOSIS — Z7902 Long term (current) use of antithrombotics/antiplatelets: Secondary | ICD-10-CM | POA: Diagnosis not present

## 2020-12-16 DIAGNOSIS — S93492D Sprain of other ligament of left ankle, subsequent encounter: Secondary | ICD-10-CM | POA: Diagnosis not present

## 2020-12-16 DIAGNOSIS — Z9181 History of falling: Secondary | ICD-10-CM | POA: Diagnosis not present

## 2020-12-19 ENCOUNTER — Other Ambulatory Visit: Payer: Self-pay

## 2020-12-19 ENCOUNTER — Ambulatory Visit (INDEPENDENT_AMBULATORY_CARE_PROVIDER_SITE_OTHER): Payer: Medicare HMO | Admitting: Podiatry

## 2020-12-19 DIAGNOSIS — Z9889 Other specified postprocedural states: Secondary | ICD-10-CM

## 2020-12-19 DIAGNOSIS — M25372 Other instability, left ankle: Secondary | ICD-10-CM

## 2020-12-20 DIAGNOSIS — Z9181 History of falling: Secondary | ICD-10-CM | POA: Diagnosis not present

## 2020-12-20 DIAGNOSIS — S93492D Sprain of other ligament of left ankle, subsequent encounter: Secondary | ICD-10-CM | POA: Diagnosis not present

## 2020-12-20 DIAGNOSIS — E119 Type 2 diabetes mellitus without complications: Secondary | ICD-10-CM | POA: Diagnosis not present

## 2020-12-20 DIAGNOSIS — K589 Irritable bowel syndrome without diarrhea: Secondary | ICD-10-CM | POA: Diagnosis not present

## 2020-12-20 DIAGNOSIS — Z7902 Long term (current) use of antithrombotics/antiplatelets: Secondary | ICD-10-CM | POA: Diagnosis not present

## 2020-12-20 DIAGNOSIS — Z86718 Personal history of other venous thrombosis and embolism: Secondary | ICD-10-CM | POA: Diagnosis not present

## 2020-12-20 DIAGNOSIS — I10 Essential (primary) hypertension: Secondary | ICD-10-CM | POA: Diagnosis not present

## 2020-12-23 ENCOUNTER — Encounter: Payer: Self-pay | Admitting: Family Medicine

## 2020-12-23 ENCOUNTER — Ambulatory Visit (INDEPENDENT_AMBULATORY_CARE_PROVIDER_SITE_OTHER): Payer: Medicare HMO | Admitting: Family Medicine

## 2020-12-23 ENCOUNTER — Other Ambulatory Visit: Payer: Self-pay

## 2020-12-23 VITALS — BP 136/64 | HR 77 | Temp 97.2°F

## 2020-12-23 DIAGNOSIS — R001 Bradycardia, unspecified: Secondary | ICD-10-CM | POA: Diagnosis not present

## 2020-12-23 DIAGNOSIS — I10 Essential (primary) hypertension: Secondary | ICD-10-CM

## 2020-12-23 DIAGNOSIS — M79672 Pain in left foot: Secondary | ICD-10-CM

## 2020-12-23 DIAGNOSIS — E1140 Type 2 diabetes mellitus with diabetic neuropathy, unspecified: Secondary | ICD-10-CM | POA: Diagnosis not present

## 2020-12-23 DIAGNOSIS — E1169 Type 2 diabetes mellitus with other specified complication: Secondary | ICD-10-CM

## 2020-12-23 DIAGNOSIS — E785 Hyperlipidemia, unspecified: Secondary | ICD-10-CM | POA: Diagnosis not present

## 2020-12-23 DIAGNOSIS — Z79899 Other long term (current) drug therapy: Secondary | ICD-10-CM

## 2020-12-23 NOTE — Progress Notes (Signed)
   Subjective:    Patient ID: [name], female    DOB: 06-09-1946, 75 y.o.   MRN: [id number]  Diabetes She presents for her follow-up diabetic visit. She has type 2 diabetes mellitus. There are no hypoglycemic associated symptoms. There are no diabetic associated symptoms. There are no diabetic complications. Exercise: unable to exercise at this time due to foot surgery  (89-90;93 yesterday) She sees a podiatrist.Eye exam is current.   Pt recently had surgery on foot back in January. Pt has been unable to move and is having to eat whatever family brings her due to unable to put weight on foot. Pt has therapy on Tuesday and Thursday.   Review of Systems     Objective:   Physical Exam Vitals reviewed.  Constitutional:      General: She is not in acute distress. HENT:     Head: Normocephalic and atraumatic.  Eyes:     General:        Right eye: No discharge.        Left eye: No discharge.  Neck:     Trachea: No tracheal deviation.  Cardiovascular:     Rate and Rhythm: Normal rate and regular rhythm.     Heart sounds: Normal heart sounds. No murmur heard.   Pulmonary:     Effort: Pulmonary effort is normal. No respiratory distress.     Breath sounds: Normal breath sounds.  Lymphadenopathy:     Cervical: No cervical adenopathy.  Skin:    General: Skin is warm and dry.  Neurological:     Mental Status: She is alert.     Coordination: Coordination normal.  Psychiatric:        Behavior: Behavior normal.           Assessment & Plan:  1. Left foot pain Follows with podiatry recently had surgery gradually improving  2. Essential hypertension Blood pressure could be a little bit better she will cut back on salt stay more active we will recheck her blood pressure in 1 month's time labs before that visit - Hemoglobin A1c - Lipid Profile - Comprehensive Metabolic Panel (CMET)  3. Type 2 diabetes mellitus with diabetic neuropathy, without long-term current use  of insulin ([place]) Labs before the visit healthy diet recommended - Hemoglobin A1c - Lipid Profile - Comprehensive Metabolic Panel (CMET)  4. Hyperlipidemia associated with type 2 diabetes mellitus ([place]) Continue medications labs before visit - Hemoglobin A1c - Lipid Profile - Comprehensive Metabolic Panel (CMET)  5. Bradycardia Patient states her heart rate was in the 40s earlier this month currently right now normal sinus rhythm no significant changes right bundle branch block noted - EKG 12-Lead  6. High risk medication use Labs ordered - Hemoglobin A1c - Lipid Profile - Comprehensive Metabolic Panel (CMET)

## 2020-12-23 NOTE — Patient Instructions (Signed)

## 2020-12-24 ENCOUNTER — Telehealth: Payer: Self-pay | Admitting: *Deleted

## 2020-12-24 DIAGNOSIS — S93492D Sprain of other ligament of left ankle, subsequent encounter: Secondary | ICD-10-CM

## 2020-12-24 NOTE — Telephone Encounter (Addendum)
Butch Penny w/ [facility] is wanting to let physician know that patient has met her goal and will be discontinued this week.they are requesting a referral for PT([facility]).Please contact at: [phone].  Patient has been released from [facility], spoke with patient and she said that they had call and will set up referral in [place] per patient's request.

## 2020-12-25 DIAGNOSIS — K589 Irritable bowel syndrome without diarrhea: Secondary | ICD-10-CM | POA: Diagnosis not present

## 2020-12-25 DIAGNOSIS — Z9181 History of falling: Secondary | ICD-10-CM | POA: Diagnosis not present

## 2020-12-25 DIAGNOSIS — Z7902 Long term (current) use of antithrombotics/antiplatelets: Secondary | ICD-10-CM | POA: Diagnosis not present

## 2020-12-25 DIAGNOSIS — Z86718 Personal history of other venous thrombosis and embolism: Secondary | ICD-10-CM | POA: Diagnosis not present

## 2020-12-25 DIAGNOSIS — I10 Essential (primary) hypertension: Secondary | ICD-10-CM | POA: Diagnosis not present

## 2020-12-25 DIAGNOSIS — S93492D Sprain of other ligament of left ankle, subsequent encounter: Secondary | ICD-10-CM | POA: Diagnosis not present

## 2020-12-25 DIAGNOSIS — E119 Type 2 diabetes mellitus without complications: Secondary | ICD-10-CM | POA: Diagnosis not present

## 2020-12-29 ENCOUNTER — Other Ambulatory Visit: Payer: Self-pay

## 2020-12-29 ENCOUNTER — Ambulatory Visit (HOSPITAL_COMMUNITY): Payer: Medicare HMO | Attending: Podiatry | Admitting: Physical Therapy

## 2020-12-29 ENCOUNTER — Encounter (HOSPITAL_COMMUNITY): Payer: Self-pay | Admitting: Physical Therapy

## 2020-12-29 DIAGNOSIS — R29898 Other symptoms and signs involving the musculoskeletal system: Secondary | ICD-10-CM | POA: Insufficient documentation

## 2020-12-29 DIAGNOSIS — M25572 Pain in left ankle and joints of left foot: Secondary | ICD-10-CM | POA: Diagnosis not present

## 2020-12-29 DIAGNOSIS — R2689 Other abnormalities of gait and mobility: Secondary | ICD-10-CM | POA: Diagnosis not present

## 2020-12-29 DIAGNOSIS — M6281 Muscle weakness (generalized): Secondary | ICD-10-CM | POA: Diagnosis not present

## 2020-12-29 NOTE — Patient Instructions (Signed)
Access Code: [email or web address] URL: [email or web address] Date: 12/29/2020 Prepared by: Mitzi Hansen Kerby Borner  Exercises Tandem Stance - 2 x daily - 7 x weekly - 3 reps - 30 second hold

## 2020-12-29 NOTE — Therapy (Signed)
[place], [name], [address] Phone: [phone]   Fax:  [phone]  Physical Therapy Evaluation  Patient Details  Name: [name] MRN: [id number] Date of Birth: December 27, 1945 Referring Provider (PT): Hardie Pulley DPM   Encounter Date: 12/29/2020   PT End of Session - 12/29/20 1132    Visit Number 1    Number of Visits 12   1-2x/week for 6 weeks   Date for PT Re-Evaluation 02/09/21    Authorization Type [employer] Medicare (no vl, auth required    Authorization Time Period 12 visits requested 5/2-6/13 - check auth    Authorization - Visit Number 1    Authorization - Number of Visits 12    Progress Note Due on Visit 10    PT Start Time 0350    PT Stop Time 1128    PT Time Calculation (min) 43 min    Activity Tolerance Patient tolerated treatment well    Behavior During Therapy [facility] for tasks assessed/performed           Past Medical History:  Diagnosis Date  . Blood clot of artery under arm ([place])   . Cyst on liver  . Depression   . Diabetes mellitus    controlled by diet  . Diverticulitis   . DVT of axillary vein, acute right ([place]) 11/24/2012   Dx on Jan 28, 2012.    . Fibrocystic breast disease   . Hepatic cyst   . Hypertension   . IBS (irritable bowel syndrome)   . Impaired glucose tolerance   . Menopause     Past Surgical History:  Procedure Laterality Date  . ABDOMINAL HYSTERECTOMY    . BACK SURGERY    . BREAST SURGERY  right   cyst removed  . CAST APPLICATION  0/93/8182   Procedure: CAST APPLICATION;  Surgeon: Carole Civil, MD;  Location: [facility] ORS;  Service: Orthopedics;  Laterality: Right;  procedure room   . CHOLECYSTECTOMY    . COLONOSCOPY  12/04/2004   NUR:Few small diverticula at sigmoid colon/Small cecal polyp ablated   . COLONOSCOPY N/A 09/24/2014   SLF: small internal hemorrhoids  . COLONOSCOPY N/A 02/15/2020   Procedure: COLONOSCOPY;  Surgeon: [name], MD;   Location: [facility];  Service: Endoscopy;  Laterality: N/A;  8:30am  . ESOPHAGOGASTRODUODENOSCOPY N/A 02/02/2016   Procedure: ESOPHAGOGASTRODUODENOSCOPY (EGD);  Surgeon: Danie Binder, MD;  Location: [facility];  Service: Endoscopy;  Laterality: N/A;  930   . KNEE ARTHROCENTESIS  left  . POLYPECTOMY  02/15/2020   Procedure: POLYPECTOMY;  Surgeon: [name], MD;  Location: [facility];  Service: Endoscopy;;  . TUBAL LIGATION      There were no vitals filed for this visit.    Subjective Assessment - 12/29/20 1047    Subjective Patient is a 75 y.o. female who presents to physical therapy s/p repair of L ATFL on 10/01/20. She had some health PT until last week. Every now and then her ankle still hurts. She is still having trouble with walking and balance. She does not have any restrictions. She has not been able to do stairs to this point. Patient states her main goal is to get herself situated so she can go on vacation and get to doing her own ADL.    Limitations Standing;House hold activities;Walking;Lifting    Patient Stated Goals is to get herself situated so she can go on vacation and get to  doing her own ADL    Currently in Pain? No/denies              [facility] PT Assessment - 12/29/20 0001      Assessment   Medical Diagnosis Sprain/repair of L ATFL    Referring Provider (PT) Hardie Pulley DPM    Onset Date/Surgical Date 10/01/20    Next MD Visit June 3    Prior Therapy yes      Precautions   Precautions None      Restrictions   Weight Bearing Restrictions No      Balance Screen   Has the patient fallen in the past 6 months No    Has the patient had a decrease in activity level because of a fear of falling?  No    Is the patient reluctant to leave their home because of a fear of falling?  No      Prior Function   Level of Independence Independent    Vocation Retired      Charity fundraiser Status Within Functional Limits for tasks assessed       Observation/Other Assessments   Observations Antalgic gait with SPC    Focus on Therapeutic Outcomes (FOTO)  47% limited      ROM / Strength   AROM / PROM / Strength AROM;Strength      AROM   AROM Assessment Site Ankle    Right/Left Ankle Left    Left Ankle Dorsiflexion 2    Left Ankle Plantar Flexion 35    Left Ankle Inversion 22    Left Ankle Eversion 21      Strength   Strength Assessment Site Hip;Knee;Ankle    Right/Left Hip Right;Left    Right Hip Flexion 5/5    Left Hip Flexion 5/5    Right/Left Knee Right;Left    Right Knee Flexion 5/5    Right Knee Extension 5/5    Left Knee Flexion 5/5    Left Knee Extension 5/5    Right/Left Ankle Right;Left    Right Ankle Dorsiflexion 5/5    Left Ankle Dorsiflexion 5/5    Left Ankle Inversion 5/5    Left Ankle Eversion 5/5      Palpation   Palpation comment TTP surgical incision and inferior to lateral malleolus      Ambulation/Gait   Ambulation/Gait Yes    Ambulation/Gait Assistance 6: Modified independent (Device/Increase time)    Ambulation Distance (Feet) 200 Feet    Assistive device Straight cane    Gait Pattern Antalgic    Ambulation Surface Level;Indoor    Gait velocity decreased    Gait Comments 2MWT      Balance   Balance Assessed Yes   SLS: < 3 seconds bilateral                     Objective measurements completed on examination: See above findings.       [facility] Adult PT Treatment/Exercise - 12/29/20 0001      Exercises   Exercises Ankle      Ankle Exercises: Standing   Other Standing Ankle Exercises tandem stance 2x 30 second holds bilateral                  PT Education - 12/29/20 1047    Education Details Patient educated on exam findings, POC, scope of PT, HEP    Person(s) Educated Patient    Methods Explanation;Demonstration;Handout    Comprehension Verbalized understanding;Returned demonstration  PT Short Term Goals - 12/29/20 1315      PT SHORT TERM  GOAL #1   Title Patient will be independent with HEP in order to improve functional outcomes.    Time 2    Period Weeks    Status New    Target Date 01/12/21      PT SHORT TERM GOAL #2   Title Patient will report at least 25% improvement in symptoms for improved quality of life.    Time 2    Period Weeks    Status New    Target Date 01/12/21             PT Long Term Goals - 12/29/20 1315      PT LONG TERM GOAL #1   Title Patient will report at least 75% improvement in symptoms for improved quality of life.    Time 6    Period Weeks    Status New    Target Date 02/09/21      PT LONG TERM GOAL #2   Title Patient will be able to ambulate at least 350 feet in 2MWT without AD in order to demonstrate improved tolerance to activity.    Time 6    Period Weeks    Status New    Target Date 02/09/21      PT LONG TERM GOAL #3   Title Patient will improve FOTO score by at least 15 points in order to indicate improved tolerance to activity.    Time 6    Period Weeks    Status New    Target Date 02/09/21      PT LONG TERM GOAL #4   Title Patient will be able to navigate stairs with reciprocal pattern without compensation in order to demonstrate improved LE strength.    Time 6    Period Weeks    Status New    Target Date 02/09/21                  Plan - 12/29/20 1308    Clinical Impression Statement Patient is a 75 y.o. female who presents to physical therapy s/p repair of L ATFL on 10/01/20. She presents with pain limited deficits in L ankle strength, ROM, gait, balance and functional mobility with ADL. She is having to modify and restrict ADL as indicated by FOTO score as well as subjective information and objective measures which is affecting overall participation. Patient will benefit from skilled physical therapy in order to improve function and reduce impairment.    Personal Factors and Comorbidities Age;Fitness;Behavior Pattern;Past/Current Experience;Comorbidity  3+;Time since onset of injury/illness/exacerbation;Finances    Comorbidities Arthritis, Back pain, increased BMI, Diabetes, High Blood Pressure    Examination-Activity Limitations Locomotion Level;Transfers;Stand;Stairs;Squat;Lift;Hygiene/Grooming;Carry;Caring for Others;Bend    Examination-Participation Restrictions Cleaning;Occupation;Community Activity;Yard Work;Volunteer;Shop;Meal Prep;Church    Stability/Clinical Decision Making Stable/Uncomplicated    Clinical Decision Making Low    Rehab Potential Good    PT Frequency Other (comment)   1-2x/week   PT Duration 6 weeks    PT Treatment/Interventions ADLs/Self Care Home Management;Aquatic Therapy;Cryotherapy;Electrical Stimulation;Iontophoresis 4mg /ml Dexamethasone;Moist Heat;Traction;Ultrasound;DME Instruction;Gait training;Stair training;Functional mobility training;Therapeutic activities;Therapeutic exercise;Balance training;Neuromuscular re-education;Patient/family education;Manual techniques;Dry needling;Energy conservation;Splinting;Taping;Spinal Manipulations;Joint Manipulations;Scar mobilization;Passive range of motion;Compression bandaging;Manual lymph drainage;Orthotic Fit/Training    PT Next Visit Plan possibly scar mobs, begin balance, functional strengthening, gait    PT Home Exercise Plan 5/2 tandem stance    Consulted and Agree with Plan of Care Patient  Patient will benefit from skilled therapeutic intervention in order to improve the following deficits and impairments:  Abnormal gait,Decreased range of motion,Difficulty walking,Decreased endurance,Decreased activity tolerance,Pain,Decreased balance,Improper body mechanics,Decreased mobility,Decreased strength,Increased edema  Visit Diagnosis: Other abnormalities of gait and mobility  Pain in left ankle and joints of left foot  Other symptoms and signs involving the musculoskeletal system     Problem List Patient Active Problem List   Diagnosis Date Noted   . Nausea without vomiting 04/17/2020  . History of adenomatous polyp of colon 01/16/2020  . Fibrocystic breast disease (FCBD) 05/14/2019  . Abdominal pain 01/15/2019  . Diarrhea 11/09/2018  . Depression, major, single episode, moderate ([place]) 10/05/2018  . Hyperlipidemia associated with type 2 diabetes mellitus ([place]) 06/06/2018  . Morbid obesity ([place]) 01/18/2018  . Encounter for dental exam and cleaning w/o abnormal findings 01/03/2018  . GERD (gastroesophageal reflux disease) 09/05/2017  . Diverticulitis 05/04/2016  . IBS (irritable bowel syndrome) 12/29/2015  . Constipation 02/03/2015  . Dyspepsia   . Loss of weight   . Decreased appetite 09/16/2014  . Unintentional weight loss 09/16/2014  . Diabetes type 2, uncontrolled ([place]) 06/27/2013  . DVT of axillary vein, acute right ([place]) 11/24/2012  . Type 2 diabetes mellitus with diabetic neuropathy, without long-term current use of insulin ([place]) 11/15/2012  . Chest pain, unspecified 05/10/2011  . Bradycardia 05/10/2011  . HYPERCHOLESTEROLEMIA 08/29/2008  . DEPRESSION 08/29/2008  . Essential hypertension 08/29/2008  . PEPTIC ULCER DISEASE 08/29/2008  . HIATAL HERNIA 08/29/2008  . HEPATIC CYST 08/29/2008  . Osteoarthritis 08/29/2008    1:40 PM, 12/29/20 Mearl Latin PT, DPT Physical Therapist at [place], [name], [address] Phone: [phone]   Fax:  [phone]  Name: [name] MRN: [id number] Date of Birth: 1946/01/15

## 2020-12-31 ENCOUNTER — Encounter (HOSPITAL_COMMUNITY): Payer: Self-pay | Admitting: Physical Therapy

## 2020-12-31 ENCOUNTER — Ambulatory Visit (HOSPITAL_COMMUNITY): Payer: Medicare HMO | Admitting: Physical Therapy

## 2020-12-31 ENCOUNTER — Ambulatory Visit: Payer: Medicare HMO | Admitting: Nurse Practitioner

## 2020-12-31 ENCOUNTER — Other Ambulatory Visit: Payer: Self-pay

## 2020-12-31 DIAGNOSIS — M6281 Muscle weakness (generalized): Secondary | ICD-10-CM

## 2020-12-31 DIAGNOSIS — R29898 Other symptoms and signs involving the musculoskeletal system: Secondary | ICD-10-CM

## 2020-12-31 DIAGNOSIS — M25572 Pain in left ankle and joints of left foot: Secondary | ICD-10-CM | POA: Diagnosis not present

## 2020-12-31 DIAGNOSIS — R2689 Other abnormalities of gait and mobility: Secondary | ICD-10-CM

## 2020-12-31 NOTE — Patient Instructions (Signed)
Access Code: [email or web address] URL: [email or web address] Date: 12/31/2020 Prepared by: Mitzi Hansen Vessie Olmsted  Exercises Heel rises with counter support - 1 x daily - 7 x weekly - 3 sets - 10 reps Toe Raises with Counter Support - 1 x daily - 7 x weekly - 3 sets - 10 reps

## 2020-12-31 NOTE — Therapy (Signed)
[place], [name], [address] Phone: [phone]   Fax:  [phone]  Physical Therapy Treatment  Patient Details  Name: [name] MRN: [id number] Date of Birth: 27-Jul-1946 Referring Provider (PT): Hardie Pulley DPM   Encounter Date: 12/31/2020   PT End of Session - 12/31/20 0958    Visit Number 2    Number of Visits 12   1-2x/week for 6 weeks   Date for PT Re-Evaluation 02/09/21    Authorization Type [employer] Medicare (no vl, auth required    Authorization Time Period 12 visits requested 5/2-6/13    Authorization - Visit Number 2    Authorization - Number of Visits 12    Progress Note Due on Visit 10    PT Start Time 1000    PT Stop Time 1040    PT Time Calculation (min) 40 min    Activity Tolerance Patient tolerated treatment well    Behavior During Therapy [facility] for tasks assessed/performed           Past Medical History:  Diagnosis Date  . Blood clot of artery under arm ([place])   . Cyst on liver  . Depression   . Diabetes mellitus    controlled by diet  . Diverticulitis   . DVT of axillary vein, acute right ([place]) 11/24/2012   Dx on Jan 28, 2012.    . Fibrocystic breast disease   . Hepatic cyst   . Hypertension   . IBS (irritable bowel syndrome)   . Impaired glucose tolerance   . Menopause     Past Surgical History:  Procedure Laterality Date  . ABDOMINAL HYSTERECTOMY    . BACK SURGERY    . BREAST SURGERY  right   cyst removed  . CAST APPLICATION  3/87/5643   Procedure: CAST APPLICATION;  Surgeon: Carole Civil, MD;  Location: [facility];  Service: Orthopedics;  Laterality: Right;  procedure room   . CHOLECYSTECTOMY    . COLONOSCOPY  12/04/2004   NUR:Few small diverticula at sigmoid colon/Small cecal polyp ablated   . COLONOSCOPY N/A 09/24/2014   SLF: small internal hemorrhoids  . COLONOSCOPY N/A 02/15/2020   Procedure: COLONOSCOPY;  Surgeon: [name], MD;  Location: [facility];  Service: Endoscopy;  Laterality: N/A;  8:30am  . ESOPHAGOGASTRODUODENOSCOPY N/A 02/02/2016   Procedure: ESOPHAGOGASTRODUODENOSCOPY (EGD);  Surgeon: Danie Binder, MD;  Location: [facility];  Service: Endoscopy;  Laterality: N/A;  930   . KNEE ARTHROCENTESIS  left  . POLYPECTOMY  02/15/2020   Procedure: POLYPECTOMY;  Surgeon: [name], MD;  Location: [facility];  Service: Endoscopy;;  . TUBAL LIGATION      There were no vitals filed for this visit.   Subjective Assessment - 12/31/20 0959    Subjective Patient states she thinks [place]. She was having some pain yesterday. Achy and swollen today. She has been working on [employer].    Limitations Standing;House hold activities;Walking;Lifting    Patient Stated Goals is to get herself situated so she can go on vacation and get to doing her own ADL    Currently in Pain? Yes    Pain Score 5     Pain Location Ankle    Pain Orientation Right    Pain Descriptors / Indicators Aching    Pain Type Surgical pain    Pain Onset More than a month ago  Pain Frequency Intermittent                             [facility] Adult PT Treatment/Exercise - 12/31/20 0001      Manual Therapy   Manual Therapy Soft tissue mobilization    Manual therapy comments completed independently from all other aspects of treatment    Soft tissue mobilization scar mobs/soft tissue mobs L lateral ankle      Ankle Exercises: Standing   Heel Raises Both;10 reps   2 sets   Toe Raise 10 reps   2 sets   Other Standing Ankle Exercises tandem stance 2x 30 second holds bilateral      Ankle Exercises: Stretches   Slant Board Stretch 3 reps;30 seconds      Ankle Exercises: Seated   BAPS Sitting;Level 2;10 reps   DF/PF, M/L, CW/ CCW                 PT Education - 12/31/20 0959    Education Details HEP, exercise mechanics, scar and soft tissue mobs    Person(s) Educated Patient     Methods Explanation;Demonstration;Handout    Comprehension Verbalized understanding;Returned demonstration            PT Short Term Goals - 12/29/20 1315      PT SHORT TERM GOAL #1   Title Patient will be independent with HEP in order to improve functional outcomes.    Time 2    Period Weeks    Status New    Target Date 01/12/21      PT SHORT TERM GOAL #2   Title Patient will report at least 25% improvement in symptoms for improved quality of life.    Time 2    Period Weeks    Status New    Target Date 01/12/21             PT Long Term Goals - 12/29/20 1315      PT LONG TERM GOAL #1   Title Patient will report at least 75% improvement in symptoms for improved quality of life.    Time 6    Period Weeks    Status New    Target Date 02/09/21      PT LONG TERM GOAL #2   Title Patient will be able to ambulate at least 350 feet in 2MWT without AD in order to demonstrate improved tolerance to activity.    Time 6    Period Weeks    Status New    Target Date 02/09/21      PT LONG TERM GOAL #3   Title Patient will improve FOTO score by at least 15 points in order to indicate improved tolerance to activity.    Time 6    Period Weeks    Status New    Target Date 02/09/21      PT LONG TERM GOAL #4   Title Patient will be able to navigate stairs with reciprocal pattern without compensation in order to demonstrate improved LE strength.    Time 6    Period Weeks    Status New    Target Date 02/09/21                 Plan - 12/31/20 0959    Clinical Impression Statement Patient requires cueing for proper mechanics with heel raises due to tendency anterior leaning secondary to strength deficits vs utilizing plantarflexion strength. Patient showing improving  static balance with tandem stance today. Patient demonstrates impaired motor control with BAPS board in seated with greatest difficulty in medial/lateral directions. Completed manual and educated patient on  performing self STM at home. Patient will continue to benefit from skilled physical therapy in order to reduce impairment and improve function.    Personal Factors and Comorbidities Age;Fitness;Behavior Pattern;Past/Current Experience;Comorbidity 3+;Time since onset of injury/illness/exacerbation;Finances    Comorbidities Arthritis, Back pain, increased BMI, Diabetes, High Blood Pressure    Examination-Activity Limitations Locomotion Level;Transfers;Stand;Stairs;Squat;Lift;Hygiene/Grooming;Carry;Caring for Others;Bend    Examination-Participation Restrictions Cleaning;Occupation;Community Activity;Yard Work;Volunteer;Shop;Meal Prep;Church    Stability/Clinical Decision Making Stable/Uncomplicated    Rehab Potential Good    PT Frequency Other (comment)   1-2x/week   PT Duration 6 weeks    PT Treatment/Interventions ADLs/Self Care Home Management;Aquatic Therapy;Cryotherapy;Electrical Stimulation;Iontophoresis 4mg /ml Dexamethasone;Moist Heat;Traction;Ultrasound;DME Instruction;Gait training;Stair training;Functional mobility training;Therapeutic activities;Therapeutic exercise;Balance training;Neuromuscular re-education;Patient/family education;Manual techniques;Dry needling;Energy conservation;Splinting;Taping;Spinal Manipulations;Joint Manipulations;Scar mobilization;Passive range of motion;Compression bandaging;Manual lymph drainage;Orthotic Fit/Training    PT Next Visit Plan possibly scar mobs, begin balance, functional strengthening, gait    PT Home Exercise Plan 5/2 tandem stance 5/4 TR, HR    Consulted and Agree with Plan of Care Patient           Patient will benefit from skilled therapeutic intervention in order to improve the following deficits and impairments:  Abnormal gait,Decreased range of motion,Difficulty walking,Decreased endurance,Decreased activity tolerance,Pain,Decreased balance,Improper body mechanics,Decreased mobility,Decreased strength,Increased edema  Visit  Diagnosis: Other abnormalities of gait and mobility  Pain in left ankle and joints of left foot  Other symptoms and signs involving the musculoskeletal system  Muscle weakness (generalized)     Problem List Patient Active Problem List   Diagnosis Date Noted  . Nausea without vomiting 04/17/2020  . History of adenomatous polyp of colon 01/16/2020  . Fibrocystic breast disease (FCBD) 05/14/2019  . Abdominal pain 01/15/2019  . Diarrhea 11/09/2018  . Depression, major, single episode, moderate ([place]) 10/05/2018  . Hyperlipidemia associated with type 2 diabetes mellitus ([place]) 06/06/2018  . Morbid obesity ([place]) 01/18/2018  . Encounter for dental exam and cleaning w/o abnormal findings 01/03/2018  . GERD (gastroesophageal reflux disease) 09/05/2017  . Diverticulitis 05/04/2016  . IBS (irritable bowel syndrome) 12/29/2015  . Constipation 02/03/2015  . Dyspepsia   . Loss of weight   . Decreased appetite 09/16/2014  . Unintentional weight loss 09/16/2014  . Diabetes type 2, uncontrolled ([place]) 06/27/2013  . DVT of axillary vein, acute right ([place]) 11/24/2012  . Type 2 diabetes mellitus with diabetic neuropathy, without long-term current use of insulin ([place]) 11/15/2012  . Chest pain, unspecified 05/10/2011  . Bradycardia 05/10/2011  . HYPERCHOLESTEROLEMIA 08/29/2008  . DEPRESSION 08/29/2008  . Essential hypertension 08/29/2008  . PEPTIC ULCER DISEASE 08/29/2008  . HIATAL HERNIA 08/29/2008  . HEPATIC CYST 08/29/2008  . Osteoarthritis 08/29/2008    10:45 AM, 12/31/20 Mearl Latin PT, DPT Physical Therapist at [place], [name], [address] Phone: [phone]   Fax:  [phone]  Name: DENYM CHRISTENBERRY MRN: [id number] Date of Birth: Sep 09, 1945

## 2021-01-01 ENCOUNTER — Other Ambulatory Visit: Payer: Self-pay | Admitting: Cardiology

## 2021-01-01 NOTE — Progress Notes (Signed)
  Subjective:  Patient ID: CROSBY BEVAN, female    DOB: [birth year],  MRN: [id number]  Chief Complaint  Patient presents with  . Foot Injury     POV #5 DOS 10/01/2020 LT ANKLE REPAIR & STABILIZATION OF LATERAL ANKLE LIGAMENTS   DOS: 10/01/20 Procedure: Left ankle ATFL repair/augmentation  75 y.o. female presents with the above complaint. History confirmed with patient.  Objective:  Physical Exam: mild peri-incisional tenderness. Good ankle strength and ROM.  Incision: healed.  Assessment:   1. Ankle instability, left   2. Post-operative state    Plan:  Patient was evaluated and treated and all questions answered.  Post-operative State -She continues to do quite well.  She feels like she is ready to get out of the ankle brace I think this is reasonable.  I would like to see her progress her weightbearing status with physical therapy and calf of the brace as tolerated.  We discussed activity restrictions and modifications to prevent injury. Follow-up in 1 month for recheck  No follow-ups on file.

## 2021-01-06 ENCOUNTER — Encounter (HOSPITAL_COMMUNITY): Payer: Self-pay | Admitting: Physical Therapy

## 2021-01-06 ENCOUNTER — Other Ambulatory Visit: Payer: Self-pay

## 2021-01-06 ENCOUNTER — Ambulatory Visit (HOSPITAL_COMMUNITY): Payer: Medicare HMO | Admitting: Physical Therapy

## 2021-01-06 DIAGNOSIS — M6281 Muscle weakness (generalized): Secondary | ICD-10-CM

## 2021-01-06 DIAGNOSIS — R2689 Other abnormalities of gait and mobility: Secondary | ICD-10-CM | POA: Diagnosis not present

## 2021-01-06 DIAGNOSIS — M25572 Pain in left ankle and joints of left foot: Secondary | ICD-10-CM | POA: Diagnosis not present

## 2021-01-06 DIAGNOSIS — R29898 Other symptoms and signs involving the musculoskeletal system: Secondary | ICD-10-CM

## 2021-01-06 NOTE — Therapy (Signed)
[place], [name], [address] Phone: [phone]   Fax:  [phone]  Physical Therapy Evaluation  Patient Details  Name: [name] MRN: [id number] Date of Birth: 09-23-1945 Referring Provider (PT): Hardie Pulley DPM   Encounter Date: 01/06/2021   PT End of Session - 01/06/21 0909    Visit Number 3    Number of Visits 12   1-2x/week for 6 weeks   Date for PT Re-Evaluation 02/09/21    Authorization Type [employer] (no vl, auth required    Authorization Time Period 12 visits requested 5/2-6/13    Authorization - Visit Number 3    Authorization - Number of Visits 12    Progress Note Due on Visit 10    PT Start Time 0915    PT Stop Time 0955    PT Time Calculation (min) 40 min    Activity Tolerance Patient tolerated treatment well    Behavior During Therapy [facility] for tasks assessed/performed           Past Medical History:  Diagnosis Date  . Blood clot of artery under arm ([place])   . Cyst on liver  . Depression   . Diabetes mellitus    controlled by diet  . Diverticulitis   . DVT of axillary vein, acute right ([place]) 11/24/2012   Dx on Jan 28, 2012.    . Fibrocystic breast disease   . Hepatic cyst   . Hypertension   . IBS (irritable bowel syndrome)   . Impaired glucose tolerance   . Menopause     Past Surgical History:  Procedure Laterality Date  . ABDOMINAL HYSTERECTOMY    . BACK SURGERY    . BREAST SURGERY  right   cyst removed  . CAST APPLICATION  6/83/4196   Procedure: CAST APPLICATION;  Surgeon: Carole Civil, MD;  Location: [facility] ORS;  Service: Orthopedics;  Laterality: Right;  procedure room   . CHOLECYSTECTOMY    . COLONOSCOPY  12/04/2004   NUR:Few small diverticula at sigmoid colon/Small cecal polyp ablated   . COLONOSCOPY N/A 09/24/2014   SLF: small internal hemorrhoids  . COLONOSCOPY N/A 02/15/2020   Procedure: COLONOSCOPY;  Surgeon: [name], MD;  Location: [facility]  ENDO SUITE;  Service: Endoscopy;  Laterality: N/A;  8:30am  . ESOPHAGOGASTRODUODENOSCOPY N/A 02/02/2016   Procedure: ESOPHAGOGASTRODUODENOSCOPY (EGD);  Surgeon: Danie Binder, MD;  Location: [facility] ENDO SUITE;  Service: Endoscopy;  Laterality: N/A;  930   . KNEE ARTHROCENTESIS  left  . POLYPECTOMY  02/15/2020   Procedure: POLYPECTOMY;  Surgeon: [name], MD;  Location: [facility] ENDO SUITE;  Service: Endoscopy;;  . TUBAL LIGATION      There were no vitals filed for this visit.    Subjective Assessment - 01/06/21 0909    Subjective States no real pain but soreness noted around the outside and the heel of the foot. Had a death in the family and she hasn't been able to do her exercises as well as she would like. Current soreness level is 3/10.    Limitations Standing;House hold activities;Walking;Lifting    Patient Stated Goals is to get herself situated so she can go on vacation and get to doing her own ADL    Currently in Pain? Yes    Pain Score 3     Pain Location Ankle    Pain Orientation Left    Pain Descriptors / Indicators Aching;Sore    Pain Type Surgical  pain    Pain Onset More than a month ago              [facility] PT Assessment - 01/06/21 0001      Assessment   Medical Diagnosis Sprain/repair of L ATFL    Referring Provider (PT) Hardie Pulley DPM    Onset Date/Surgical Date 10/01/20    Next MD Visit June 3    Prior Therapy yes                      Objective measurements completed on examination: See above findings.       [place] Adult PT Treatment/Exercise - 01/06/21 0001      Ambulation/Gait   Ambulation/Gait Yes    Ambulation/Gait Assistance 6: Modified independent (Device/Increase time)    Ambulation Distance (Feet) 226 Feet    Assistive device Straight cane    Ambulation Surface Level;Unlevel    Gait velocity decreased      Exercises   Exercises Knee/Hip      Knee/Hip Exercises: Standing   Forward Step Up Step Height: 2";Hand Hold: 1;Left;10  reps    Step Down Left;Hand Hold: 1;Step Height: 2";15 reps    Other Standing Knee Exercises narrow base of support with head turns SBA - 3x5 bilateral - slow and controlled      Knee/Hip Exercises: Seated   Sit to Sand 15 reps;without UE support   equal weight on both legs     Ankle Exercises: Seated   Heel Raises Both;20 reps    Toe Raise 20 reps   both   BAPS Sitting;Level 2;10 reps   CW/CCW, DF/PF/INV/EVEr                   PT Short Term Goals - 12/29/20 1315      PT SHORT TERM GOAL #1   Title Patient will be independent with HEP in order to improve functional outcomes.    Time 2    Period Weeks    Status New    Target Date 01/12/21      PT SHORT TERM GOAL #2   Title Patient will report at least 25% improvement in symptoms for improved quality of life.    Time 2    Period Weeks    Status New    Target Date 01/12/21             PT Long Term Goals - 12/29/20 1315      PT LONG TERM GOAL #1   Title Patient will report at least 75% improvement in symptoms for improved quality of life.    Time 6    Period Weeks    Status New    Target Date 02/09/21      PT LONG TERM GOAL #2   Title Patient will be able to ambulate at least 350 feet in 2MWT without AD in order to demonstrate improved tolerance to activity.    Time 6    Period Weeks    Status New    Target Date 02/09/21      PT LONG TERM GOAL #3   Title Patient will improve FOTO score by at least 15 points in order to indicate improved tolerance to activity.    Time 6    Period Weeks    Status New    Target Date 02/09/21      PT LONG TERM GOAL #4   Title Patient will be able to navigate stairs with reciprocal pattern  without compensation in order to demonstrate improved LE strength.    Time 6    Period Weeks    Status New    Target Date 02/09/21                  Plan - 01/06/21 0909    Clinical Impression Statement Patient tolerated session well with no reports of increased pain.  Initially patient had minor loss of balance with step down as she chose not to hold on to railing, PT corrected with min assist. With holding on after this patient did fine with no difficulties. Will add in gait trainer and walking outside to determine safety of walking outside at home and to see if patient can ambulate safely to mailbox.    Personal Factors and Comorbidities Age;Fitness;Behavior Pattern;Past/Current Experience;Comorbidity 3+;Time since onset of injury/illness/exacerbation;Finances    Comorbidities Arthritis, Back pain, increased BMI, Diabetes, High Blood Pressure    Examination-Activity Limitations Locomotion Level;Transfers;Stand;Stairs;Squat;Lift;Hygiene/Grooming;Carry;Caring for Others;Bend    Examination-Participation Restrictions Cleaning;Occupation;Community Activity;Yard Work;Volunteer;Shop;Meal Prep;Church    Stability/Clinical Decision Making Stable/Uncomplicated    Rehab Potential Good    PT Frequency Other (comment)   1-2x/week   PT Duration 6 weeks    PT Treatment/Interventions ADLs/Self Care Home Management;Aquatic Therapy;Cryotherapy;Electrical Stimulation;Iontophoresis 4mg /ml Dexamethasone;Moist Heat;Traction;Ultrasound;DME Instruction;Gait training;Stair training;Functional mobility training;Therapeutic activities;Therapeutic exercise;Balance training;Neuromuscular re-education;Patient/family education;Manual techniques;Dry needling;Energy conservation;Splinting;Taping;Spinal Manipulations;Joint Manipulations;Scar mobilization;Passive range of motion;Compression bandaging;Manual lymph drainage;Orthotic Fit/Training    PT Next Visit Plan possibly scar mobs, begin balance, functional strengthening, gait trainer, walking outside    PT Home Exercise Plan 5/2 tandem stance 5/4 TR, HR; 5/10 narrow BOS with head turns    Consulted and Agree with Plan of Care Patient           Patient will benefit from skilled therapeutic intervention in order to improve the following  deficits and impairments:  Abnormal gait,Decreased range of motion,Difficulty walking,Decreased endurance,Decreased activity tolerance,Pain,Decreased balance,Improper body mechanics,Decreased mobility,Decreased strength,Increased edema  Visit Diagnosis: Other abnormalities of gait and mobility  Pain in left ankle and joints of left foot  Other symptoms and signs involving the musculoskeletal system  Muscle weakness (generalized)     Problem List Patient Active Problem List   Diagnosis Date Noted  . Nausea without vomiting 04/17/2020  . History of adenomatous polyp of colon 01/16/2020  . Fibrocystic breast disease (FCBD) 05/14/2019  . Abdominal pain 01/15/2019  . Diarrhea 11/09/2018  . Depression, major, single episode, moderate ([place]) 10/05/2018  . Hyperlipidemia associated with type 2 diabetes mellitus ([place]) 06/06/2018  . Morbid obesity ([place]) 01/18/2018  . Encounter for dental exam and cleaning w/o abnormal findings 01/03/2018  . GERD (gastroesophageal reflux disease) 09/05/2017  . Diverticulitis 05/04/2016  . IBS (irritable bowel syndrome) 12/29/2015  . Constipation 02/03/2015  . Dyspepsia   . Loss of weight   . Decreased appetite 09/16/2014  . Unintentional weight loss 09/16/2014  . Diabetes type 2, uncontrolled ([place]) 06/27/2013  . DVT of axillary vein, acute right ([place]) 11/24/2012  . Type 2 diabetes mellitus with diabetic neuropathy, without long-term current use of insulin ([place]) 11/15/2012  . Chest pain, unspecified 05/10/2011  . Bradycardia 05/10/2011  . HYPERCHOLESTEROLEMIA 08/29/2008  . DEPRESSION 08/29/2008  . Essential hypertension 08/29/2008  . PEPTIC ULCER DISEASE 08/29/2008  . HIATAL HERNIA 08/29/2008  . HEPATIC CYST 08/29/2008  . Osteoarthritis 08/29/2008   9:57 AM, 01/06/21 Jerene Pitch, DPT Physical Therapy with [facility]  [phone] office  [place] [address]  [place], [name], [address] Phone: [phone]   Fax:  [phone]  Name: [name] MRN: [id number] Date of Birth: 18-Jun-1946

## 2021-01-13 ENCOUNTER — Ambulatory Visit (HOSPITAL_COMMUNITY): Payer: Medicare HMO | Admitting: Physical Therapy

## 2021-01-13 ENCOUNTER — Other Ambulatory Visit: Payer: Self-pay

## 2021-01-13 ENCOUNTER — Encounter (HOSPITAL_COMMUNITY): Payer: Self-pay | Admitting: Physical Therapy

## 2021-01-13 DIAGNOSIS — I10 Essential (primary) hypertension: Secondary | ICD-10-CM | POA: Diagnosis not present

## 2021-01-13 DIAGNOSIS — R29898 Other symptoms and signs involving the musculoskeletal system: Secondary | ICD-10-CM

## 2021-01-13 DIAGNOSIS — R2689 Other abnormalities of gait and mobility: Secondary | ICD-10-CM

## 2021-01-13 DIAGNOSIS — M6281 Muscle weakness (generalized): Secondary | ICD-10-CM

## 2021-01-13 DIAGNOSIS — E785 Hyperlipidemia, unspecified: Secondary | ICD-10-CM | POA: Diagnosis not present

## 2021-01-13 DIAGNOSIS — Z79899 Other long term (current) drug therapy: Secondary | ICD-10-CM | POA: Diagnosis not present

## 2021-01-13 DIAGNOSIS — E1169 Type 2 diabetes mellitus with other specified complication: Secondary | ICD-10-CM | POA: Diagnosis not present

## 2021-01-13 DIAGNOSIS — E1140 Type 2 diabetes mellitus with diabetic neuropathy, unspecified: Secondary | ICD-10-CM | POA: Diagnosis not present

## 2021-01-13 DIAGNOSIS — M25572 Pain in left ankle and joints of left foot: Secondary | ICD-10-CM

## 2021-01-13 NOTE — Therapy (Signed)
[place], [name], [address] Phone: [phone]   Fax:  [phone]  Physical Therapy Treatment  Patient Details  Name: [name] MRN: [id number] Date of Birth: 01/25/1946 Referring Provider (PT): Hardie Pulley DPM   Encounter Date: 01/13/2021   PT End of Session - 01/13/21 0958    Visit Number 4    Number of Visits 12   1-2x/week for 6 weeks   Date for PT Re-Evaluation 02/09/21    Authorization Type [employer] (no vl, auth required    Authorization Time Period 12 visits requested 5/2-6/13    Authorization - Visit Number 4    Authorization - Number of Visits 12    Progress Note Due on Visit 10    PT Start Time 1000    PT Stop Time 1047    PT Time Calculation (min) 47 min    Activity Tolerance Patient tolerated treatment well    Behavior During Therapy [facility] for tasks assessed/performed           Past Medical History:  Diagnosis Date  . Blood clot of artery under arm ([place])   . Cyst on liver  . Depression   . Diabetes mellitus    controlled by diet  . Diverticulitis   . DVT of axillary vein, acute right ([place]) 11/24/2012   Dx on Jan 28, 2012.    . Fibrocystic breast disease   . Hepatic cyst   . Hypertension   . IBS (irritable bowel syndrome)   . Impaired glucose tolerance   . Menopause     Past Surgical History:  Procedure Laterality Date  . ABDOMINAL HYSTERECTOMY    . BACK SURGERY    . BREAST SURGERY  right   cyst removed  . CAST APPLICATION  7/78/2423   Procedure: CAST APPLICATION;  Surgeon: Carole Civil, MD;  Location: [facility] ORS;  Service: Orthopedics;  Laterality: Right;  procedure room   . CHOLECYSTECTOMY    . COLONOSCOPY  12/04/2004   NUR:Few small diverticula at sigmoid colon/Small cecal polyp ablated   . COLONOSCOPY N/A 09/24/2014   SLF: small internal hemorrhoids  . COLONOSCOPY N/A 02/15/2020   Procedure: COLONOSCOPY;  Surgeon: [name], MD;  Location: [facility]  ENDO SUITE;  Service: Endoscopy;  Laterality: N/A;  8:30am  . ESOPHAGOGASTRODUODENOSCOPY N/A 02/02/2016   Procedure: ESOPHAGOGASTRODUODENOSCOPY (EGD);  Surgeon: Danie Binder, MD;  Location: [facility] END[facility] SU[facility];  Service: Endoscopy;  Laterality: N/A;  930   . KNEE ARTHROCENTESIS  left  . POLYPECTOMY  02/15/2020   Procedure: POLYPECTOMY;  Surgeon: [name], MD;  Location: [facility] ENDO SUITE;  Service: Endoscopy;;  . TUBAL LIGATION      There were no vitals filed for this visit.   Subjective Assessment - 01/13/21 0959    Subjective Patient states her ankle has been bothering her because she was on her feet a lot for the last week. She had several deaths in the family so she has not been able to do many exercises. She is having heel pain today.    Limitations Standing;House hold activities;Walking;Lifting    Patient Stated Goals is to get herself situated so she can go on vacation and get to doing her own ADL    Currently in Pain? Yes    Pain Score 4     Pain Location Heel    Pain Orientation Left    Pain Descriptors / Indicators Aching;Sore    Pain Type Surgical  pain    Pain Onset More than a month ago                             [facility] Adult PT Treatment/Exercise - 01/13/21 0001      Ambulation/Gait   Ambulation/Gait Yes    Ambulation/Gait Assistance 6: Modified independent (Device/Increase time)    Ambulation Distance (Feet) 300 Feet    Assistive device Straight cane    Ambulation Surface Unlevel;Paved    Gait velocity decreased      Knee/Hip Exercises: Standing   Forward Step Up Hand Hold: 1;Left;10 reps;Step Height: 4";Step Height: 6"    Step Down Left;Hand Hold: 1;Step Height: 4";10 reps;Step Height: 6"    Stairs 5RT 7inch step with alternating pattern with bilateral UE support    SLS with Vectors 3x 5 second holds bilateral      Ankle Exercises: Stretches   Slant Board Stretch 3 reps;30 seconds      Ankle Exercises: Standing   Heel Raises Both;10 reps   3  sets                 PT Education - 01/13/21 0959    Education Details HEP, exercise mechanics, stair mechanics    Person(s) Educated Patient    Methods Explanation;Demonstration    Comprehension Verbalized understanding;Returned demonstration            PT Short Term Goals - 12/29/20 1315      PT SHORT TERM GOAL #1   Title Patient will be independent with HEP in order to improve functional outcomes.    Time 2    Period Weeks    Status New    Target Date 01/12/21      PT SHORT TERM GOAL #2   Title Patient will report at least 25% improvement in symptoms for improved quality of life.    Time 2    Period Weeks    Status New    Target Date 01/12/21             PT Long Term Goals - 12/29/20 1315      PT LONG TERM GOAL #1   Title Patient will report at least 75% improvement in symptoms for improved quality of life.    Time 6    Period Weeks    Status New    Target Date 02/09/21      PT LONG TERM GOAL #2   Title Patient will be able to ambulate at least 350 feet in 2MWT without AD in order to demonstrate improved tolerance to activity.    Time 6    Period Weeks    Status New    Target Date 02/09/21      PT LONG TERM GOAL #3   Title Patient will improve FOTO score by at least 15 points in order to indicate improved tolerance to activity.    Time 6    Period Weeks    Status New    Target Date 02/09/21      PT LONG TERM GOAL #4   Title Patient will be able to navigate stairs with reciprocal pattern without compensation in order to demonstrate improved LE strength.    Time 6    Period Weeks    Status New    Target Date 02/09/21                 Plan - 01/13/21 0959    Clinical  Impression Statement Patient able to ambulate and navigate ambulating in the parking lot with use of SPC safely. She tends to slow on uneven and rocky surfaces but is able to manage independently. Patient tolerates increased height of step up/down with minimal UE support  showing improving LE strength. Second set of step up/down exercises completed from 6 inch step with good mechanics. Patient requires unilateral UE support for single leg stance with vectors secondary to impaired balance and LE strength. Patient will continue to benefit from skilled physical therapy in order to reduce impairment and improve function.    Personal Factors and Comorbidities Age;Fitness;Behavior Pattern;Past/Current Experience;Comorbidity 3+;Time since onset of injury/illness/exacerbation;Finances    Comorbidities Arthritis, Back pain, increased BMI, Diabetes, High Blood Pressure    Examination-Activity Limitations Locomotion Level;Transfers;Stand;Stairs;Squat;Lift;Hygiene/Grooming;Carry;Caring for Others;Bend    Examination-Participation Restrictions Cleaning;Occupation;Community Activity;Yard Work;Volunteer;Shop;Meal Prep;Church    Stability/Clinical Decision Making Stable/Uncomplicated    Rehab Potential Good    PT Frequency Other (comment)   1-2x/week   PT Duration 6 weeks    PT Treatment/Interventions ADLs/Self Care Home Management;Aquatic Therapy;Cryotherapy;Electrical Stimulation;Iontophoresis 4mg /ml Dexamethasone;Moist Heat;Traction;Ultrasound;DME Instruction;Gait training;Stair training;Functional mobility training;Therapeutic activities;Therapeutic exercise;Balance training;Neuromuscular re-education;Patient/family education;Manual techniques;Dry needling;Energy conservation;Splinting;Taping;Spinal Manipulations;Joint Manipulations;Scar mobilization;Passive range of motion;Compression bandaging;Manual lymph drainage;Orthotic Fit/Training    PT Next Visit Plan possibly scar mobs, continue balance, functional strengthening, gait trainer, walking outside    PT Home Exercise Plan 5/2 tandem stance 5/4 TR, HR; 5/10 narrow BOS with head turns    Consulted and Agree with Plan of Care Patient           Patient will benefit from skilled therapeutic intervention in order to improve the  following deficits and impairments:  Abnormal gait,Decreased range of motion,Difficulty walking,Decreased endurance,Decreased activity tolerance,Pain,Decreased balance,Improper body mechanics,Decreased mobility,Decreased strength,Increased edema  Visit Diagnosis: Other abnormalities of gait and mobility  Pain in left ankle and joints of left foot  Other symptoms and signs involving the musculoskeletal system  Muscle weakness (generalized)     Problem List Patient Active Problem List   Diagnosis Date Noted  . Nausea without vomiting 04/17/2020  . History of adenomatous polyp of colon 01/16/2020  . Fibrocystic breast disease (FCBD) 05/14/2019  . Abdominal pain 01/15/2019  . Diarrhea 11/09/2018  . Depression, major, single episode, moderate ([place]) 10/05/2018  . Hyperlipidemia associated with type 2 diabetes mellitus ([place]) 06/06/2018  . Morbid obesity ([place]) 01/18/2018  . Encounter for dental exam and cleaning w/o abnormal findings 01/03/2018  . GERD (gastroesophageal reflux disease) 09/05/2017  . Diverticulitis 05/04/2016  . IBS (irritable bowel syndrome) 12/29/2015  . Constipation 02/03/2015  . Dyspepsia   . Loss of weight   . Decreased appetite 09/16/2014  . Unintentional weight loss 09/16/2014  . Diabetes type 2, uncontrolled ([place]) 06/27/2013  . DVT of axillary vein, acute right ([place]) 11/24/2012  . Type 2 diabetes mellitus with diabetic neuropathy, without long-term current use of insulin ([place]) 11/15/2012  . Chest pain, unspecified 05/10/2011  . Bradycardia 05/10/2011  . HYPERCHOLESTEROLEMIA 08/29/2008  . DEPRESSION 08/29/2008  . Essential hypertension 08/29/2008  . PEPTIC ULCER DISEASE 08/29/2008  . HIATAL HERNIA 08/29/2008  . HEPATIC CYST 08/29/2008  . Osteoarthritis 08/29/2008   10:52 AM, 01/13/21 Mearl Latin PT, DPT Physical Therapist at [place], [name], [address] Phone: [phone]   Fax:  [phone]  Name: LORI-ANN LINDFORS MRN: [id number] Date of Birth: 06-11-1946

## 2021-01-14 LAB — HEMOGLOBIN A1C
Est. average glucose Bld gHb Est-mCnc: 146 mg/dL
Hgb A1c MFr Bld: 6.7 % — ABNORMAL HIGH (ref 4.8–5.6)

## 2021-01-14 LAB — LIPID PANEL
Chol/HDL Ratio: 2.7 ratio (ref 0.0–4.4)
Cholesterol, Total: 149 mg/dL (ref 100–199)
HDL: 55 mg/dL (ref >39)
LDL Chol Calc (NIH): 77 mg/dL (ref 0–99)
Triglycerides: 90 mg/dL (ref 0–149)
VLDL Cholesterol Cal: 17 mg/dL (ref 5–40)

## 2021-01-14 LAB — COMPREHENSIVE METABOLIC PANEL
ALT: 17 IU/L (ref 0–32)
AST: 19 IU/L (ref 0–40)
Albumin/Globulin Ratio: 1.5 (ref 1.2–2.2)
Albumin: 4.1 g/dL (ref 3.7–4.7)
Alkaline Phosphatase: 78 IU/L (ref 44–121)
BUN/Creatinine Ratio: 19 (ref 12–28)
BUN: 15 mg/dL (ref 8–27)
Bilirubin Total: 0.3 mg/dL (ref 0.0–1.2)
CO2: 24 mmol/L (ref 20–29)
Calcium: 10.1 mg/dL (ref 8.7–10.3)
Chloride: 103 mmol/L (ref 96–106)
Creatinine, Ser: 0.79 mg/dL (ref 0.57–1.00)
Globulin, Total: 2.8 g/dL (ref 1.5–4.5)
Glucose: 109 mg/dL — ABNORMAL HIGH (ref 65–99)
Potassium: 4.8 mmol/L (ref 3.5–5.2)
Sodium: 143 mmol/L (ref 134–144)
Total Protein: 6.9 g/dL (ref 6.0–8.5)
eGFR: 79 mL/min/{1.73_m2} (ref >59)

## 2021-01-20 ENCOUNTER — Ambulatory Visit (HOSPITAL_COMMUNITY): Payer: Medicare HMO | Admitting: Physical Therapy

## 2021-01-22 ENCOUNTER — Ambulatory Visit (INDEPENDENT_AMBULATORY_CARE_PROVIDER_SITE_OTHER): Payer: Medicare HMO | Admitting: Family Medicine

## 2021-01-22 ENCOUNTER — Other Ambulatory Visit: Payer: Self-pay

## 2021-01-22 ENCOUNTER — Encounter: Payer: Self-pay | Admitting: Family Medicine

## 2021-01-22 VITALS — BP 132/84 | HR 79 | Wt 232.4 lb

## 2021-01-22 DIAGNOSIS — I10 Essential (primary) hypertension: Secondary | ICD-10-CM | POA: Diagnosis not present

## 2021-01-22 DIAGNOSIS — M79672 Pain in left foot: Secondary | ICD-10-CM

## 2021-01-22 NOTE — Progress Notes (Signed)
   Subjective:    Patient ID: [name], female    DOB: [birth year], 75 y.o.   MRN: [id number]  HPI Pt here for follow up. Pt has brought recent blood sugars readings. Pt having no issues. Has been eating more since she can do more for herself. States doing much better will watch diet.  Not taking medication any longer   Left foot pain has improved. Does feel like something in pulling inside where incision is.  Foot is doing better since she has had surgery she is doing physical therapy  Pt has overflow of pills that she is no longer on and wants to know what she can do with them  We advised her to dispose of these via the [employer]  Review of Systems     Objective:   Physical Exam  Lungs clear respiratory rate normal heart regular pulse normal BP good      Assessment & Plan:  Blood pressure good control continue current measures Patient working on diet weight and reduction of weight Patient doing the best she can with healthy eating Will continue physical therapy  We will send notification to her mail order pharmacy that she is no longer on glipizide or chlorthalidone  Follow-up by late fall

## 2021-01-26 ENCOUNTER — Telehealth: Payer: Self-pay | Admitting: Family Medicine

## 2021-01-26 NOTE — Telephone Encounter (Signed)
Nurses-please let the patient know that I reviewed over her glucose readings that she brought to the office.  Overall they look very good.  We do not need to initiate any medications currently.  Healthy diet regular activity is the best approach currently.  She has a follow-up visit in August we will see her then

## 2021-01-27 ENCOUNTER — Ambulatory Visit (HOSPITAL_COMMUNITY): Payer: Medicare HMO | Admitting: Physical Therapy

## 2021-01-27 ENCOUNTER — Encounter (HOSPITAL_COMMUNITY): Payer: Self-pay | Admitting: Physical Therapy

## 2021-01-27 ENCOUNTER — Other Ambulatory Visit: Payer: Self-pay

## 2021-01-27 DIAGNOSIS — M6281 Muscle weakness (generalized): Secondary | ICD-10-CM

## 2021-01-27 DIAGNOSIS — R29898 Other symptoms and signs involving the musculoskeletal system: Secondary | ICD-10-CM

## 2021-01-27 DIAGNOSIS — R2689 Other abnormalities of gait and mobility: Secondary | ICD-10-CM | POA: Diagnosis not present

## 2021-01-27 DIAGNOSIS — M25572 Pain in left ankle and joints of left foot: Secondary | ICD-10-CM

## 2021-01-27 NOTE — Telephone Encounter (Signed)
Left message to return call 

## 2021-01-27 NOTE — Telephone Encounter (Signed)
Pt returned call and verbalized understanding  

## 2021-01-27 NOTE — Therapy (Signed)
[facility], [name], [address] Phone: [phone]   Fax:  [phone]  Physical Therapy Treatment  Patient Details  Name: [name] MRN: [id number] Date of Birth: 1945-10-19 Referring Provider (PT): Hardie Pulley DPM   Encounter Date: 01/27/2021   PT End of Session - 01/27/21 0918    Visit Number 5    Number of Visits 12   1-2x/week for 6 weeks   Date for PT Re-Evaluation 02/09/21    Authorization Type [employer] (no vl, auth required    Authorization Time Period 12 visits requested 5/2-6/13    Authorization - Visit Number 5    Authorization - Number of Visits 12    Progress Note Due on Visit 10    PT Start Time 0918    PT Stop Time 0957    PT Time Calculation (min) 39 min    Activity Tolerance Patient tolerated treatment well    Behavior During Therapy [facility] for tasks assessed/performed           Past Medical History:  Diagnosis Date  . Blood clot of artery under arm ([place])   . Cyst on liver  . Depression   . Diabetes mellitus    controlled by diet  . Diverticulitis   . DVT of axillary vein, acute right ([place]) 11/24/2012   Dx on Jan 28, 2012.    . Fibrocystic breast disease   . Hepatic cyst   . Hypertension   . IBS (irritable bowel syndrome)   . Impaired glucose tolerance   . Menopause     Past Surgical History:  Procedure Laterality Date  . ABDOMINAL HYSTERECTOMY    . BACK SURGERY    . BREAST SURGERY  right   cyst removed  . CAST APPLICATION  4/43/1540   Procedure: CAST APPLICATION;  Surgeon: Carole Civil, MD;  Location: [facility] ORS;  Service: Orthopedics;  Laterality: Right;  procedure room   . CHOLECYSTECTOMY    . COLONOSCOPY  12/04/2004   NUR:Few small diverticula at sigmoid colon/Small cecal polyp ablated   . COLONOSCOPY N/A 09/24/2014   SLF: small internal hemorrhoids  . COLONOSCOPY N/A 02/15/2020   Procedure: COLONOSCOPY;  Surgeon: [name], MD;  Location: [facility]  ENDO SUITE;  Service: Endoscopy;  Laterality: N/A;  8:30am  . ESOPHAGOGASTRODUODENOSCOPY N/A 02/02/2016   Procedure: ESOPHAGOGASTRODUODENOSCOPY (EGD);  Surgeon: Danie Binder, MD;  Location: [facility] ENDO SUITE;  Service: Endoscopy;  Laterality: N/A;  930   . KNEE ARTHROCENTESIS  left  . POLYPECTOMY  02/15/2020   Procedure: POLYPECTOMY;  Surgeon: [name], MD;  Location: [facility] ENDO SUITE;  Service: Endoscopy;;  . TUBAL LIGATION      There were no vitals filed for this visit.   Subjective Assessment - 01/27/21 0919    Subjective Patient states she lost power last week. Patient states her ankle is doing well. She has not been able to do stairs.    Limitations Standing;House hold activities;Walking;Lifting    Patient Stated Goals is to get herself situated so she can go on vacation and get to doing her own ADL    Currently in Pain? No/denies    Pain Onset More than a month ago                             [facility] Adult PT Treatment/Exercise - 01/27/21 0001      Knee/Hip Exercises: Standing  Stairs 5RT 7inch step with alternating pattern with unilateral UE support    SLS with Vectors 4x 5 second holds bilateral    Other Standing Knee Exercises retro weighted walking 2x 5 3 plates      Ankle Exercises: Stretches   Slant Board Stretch 3 reps;30 seconds      Ankle Exercises: Standing   Heel Raises Both;20 reps    Other Standing Ankle Exercises tandem stance 3x 30 second holds bilateral on airex;  lateral stepping 4x 10 feet                  PT Education - 01/27/21 0919    Education Details HEP, exercise mechanics, trying stairs at home with daughter present    Person(s) Educated Patient    Methods Explanation;Demonstration    Comprehension Verbalized understanding;Returned demonstration            PT Short Term Goals - 12/29/20 1315      PT SHORT TERM GOAL #1   Title Patient will be independent with HEP in order to improve functional outcomes.    Time 2     Period Weeks    Status New    Target Date 01/12/21      PT SHORT TERM GOAL #2   Title Patient will report at least 25% improvement in symptoms for improved quality of life.    Time 2    Period Weeks    Status New    Target Date 01/12/21             PT Long Term Goals - 12/29/20 1315      PT LONG TERM GOAL #1   Title Patient will report at least 75% improvement in symptoms for improved quality of life.    Time 6    Period Weeks    Status New    Target Date 02/09/21      PT LONG TERM GOAL #2   Title Patient will be able to ambulate at least 350 feet in 2MWT without AD in order to demonstrate improved tolerance to activity.    Time 6    Period Weeks    Status New    Target Date 02/09/21      PT LONG TERM GOAL #3   Title Patient will improve FOTO score by at least 15 points in order to indicate improved tolerance to activity.    Time 6    Period Weeks    Status New    Target Date 02/09/21      PT LONG TERM GOAL #4   Title Patient will be able to navigate stairs with reciprocal pattern without compensation in order to demonstrate improved LE strength.    Time 6    Period Weeks    Status New    Target Date 02/09/21                 Plan - 01/27/21 3149    Clinical Impression Statement Patient progressing well with PT intervention, discussed POC with patient and anticipate d/c next session. Patient educated on focusing on what she is having difficulty with over the next week. Patient able to ambulate stairs with unilateral UE support for balance but is able to navigate with alternating pattern. Patient with minimal unsteadiness secondary to impaired dynamic single leg balance with retro weighted ambulation but does not have loss of balance. Patient with min/mod sway with tandem stance on foam requires frequent HHA. Patient will continue to benefit from skilled  physical therapy in order to reduce impairment and improve function.    Personal Factors and  Comorbidities Age;Fitness;Behavior Pattern;Past/Current Experience;Comorbidity 3+;Time since onset of injury/illness/exacerbation;Finances    Comorbidities Arthritis, Back pain, increased BMI, Diabetes, High Blood Pressure    Examination-Activity Limitations Locomotion Level;Transfers;Stand;Stairs;Squat;Lift;Hygiene/Grooming;Carry;Caring for Others;Bend    Examination-Participation Restrictions Cleaning;Occupation;Community Activity;Yard Work;Volunteer;Shop;Meal Prep;Church    Stability/Clinical Decision Making Stable/Uncomplicated    Rehab Potential Good    PT Frequency Other (comment)   1-2x/week   PT Duration 6 weeks    PT Treatment/Interventions ADLs/Self Care Home Management;Aquatic Therapy;Cryotherapy;Electrical Stimulation;Iontophoresis 4mg /ml Dexamethasone;Moist Heat;Traction;Ultrasound;DME Instruction;Gait training;Stair training;Functional mobility training;Therapeutic activities;Therapeutic exercise;Balance training;Neuromuscular re-education;Patient/family education;Manual techniques;Dry needling;Energy conservation;Splinting;Taping;Spinal Manipulations;Joint Manipulations;Scar mobilization;Passive range of motion;Compression bandaging;Manual lymph drainage;Orthotic Fit/Training    PT Next Visit Plan possibly scar mobs, continue balance, functional strengthening, gait trainer, walking outside    PT Home Exercise Plan 5/2 tandem stance 5/4 TR, HR; 5/10 narrow BOS with head turns    Consulted and Agree with Plan of Care Patient           Patient will benefit from skilled therapeutic intervention in order to improve the following deficits and impairments:  Abnormal gait,Decreased range of motion,Difficulty walking,Decreased endurance,Decreased activity tolerance,Pain,Decreased balance,Improper body mechanics,Decreased mobility,Decreased strength,Increased edema  Visit Diagnosis: Other abnormalities of gait and mobility  Pain in left ankle and joints of left foot  Other symptoms and  signs involving the musculoskeletal system  Muscle weakness (generalized)     Problem List Patient Active Problem List   Diagnosis Date Noted  . Nausea without vomiting 04/17/2020  . History of adenomatous polyp of colon 01/16/2020  . Fibrocystic breast disease (FCBD) 05/14/2019  . Abdominal pain 01/15/2019  . Diarrhea 11/09/2018  . Depression, major, single episode, moderate ([place]) 10/05/2018  . Hyperlipidemia associated with type 2 diabetes mellitus ([place]) 06/06/2018  . Morbid obesity ([place]) 01/18/2018  . Encounter for dental exam and cleaning w/o abnormal findings 01/03/2018  . GERD (gastroesophageal reflux disease) 09/05/2017  . Diverticulitis 05/04/2016  . IBS (irritable bowel syndrome) 12/29/2015  . Constipation 02/03/2015  . Dyspepsia   . Loss of weight   . Decreased appetite 09/16/2014  . Unintentional weight loss 09/16/2014  . Diabetes type 2, uncontrolled ([place]) 06/27/2013  . DVT of axillary vein, acute right ([place]) 11/24/2012  . Type 2 diabetes mellitus with diabetic neuropathy, without long-term current use of insulin ([place]) 11/15/2012  . Chest pain, unspecified 05/10/2011  . Bradycardia 05/10/2011  . HYPERCHOLESTEROLEMIA 08/29/2008  . DEPRESSION 08/29/2008  . Essential hypertension 08/29/2008  . PEPTIC ULCER DISEASE 08/29/2008  . HIATAL HERNIA 08/29/2008  . HEPATIC CYST 08/29/2008  . Osteoarthritis 08/29/2008     9:58 AM, 01/27/21 Mearl Latin PT, DPT Physical Therapist at [place], [name], [address] Phone: [phone]   Fax:  [phone]  Name: [name] MRN: [id number] Date of Birth: March 03, 1946

## 2021-01-30 ENCOUNTER — Encounter: Payer: Medicare HMO | Admitting: Podiatry

## 2021-02-03 ENCOUNTER — Encounter (HOSPITAL_COMMUNITY): Payer: Self-pay | Admitting: Physical Therapy

## 2021-02-03 ENCOUNTER — Other Ambulatory Visit: Payer: Self-pay

## 2021-02-03 ENCOUNTER — Ambulatory Visit (HOSPITAL_COMMUNITY): Payer: Medicare HMO | Attending: Podiatry | Admitting: Physical Therapy

## 2021-02-03 DIAGNOSIS — R29898 Other symptoms and signs involving the musculoskeletal system: Secondary | ICD-10-CM | POA: Insufficient documentation

## 2021-02-03 DIAGNOSIS — M6281 Muscle weakness (generalized): Secondary | ICD-10-CM | POA: Diagnosis not present

## 2021-02-03 DIAGNOSIS — R2689 Other abnormalities of gait and mobility: Secondary | ICD-10-CM | POA: Insufficient documentation

## 2021-02-03 DIAGNOSIS — M25572 Pain in left ankle and joints of left foot: Secondary | ICD-10-CM | POA: Insufficient documentation

## 2021-02-03 NOTE — Patient Instructions (Signed)
Access Code: [facility] URL: [email or web address] Date: 02/03/2021 Prepared by: Mitzi Hansen Miyana Mordecai  Exercises Single Leg Balance with Clock Reach - 1 x daily - 7 x weekly - 5 reps - 5 second hold Gastroc Stretch on Wall - 1 x daily - 7 x weekly - 3 reps - 30 second hold

## 2021-02-03 NOTE — Therapy (Signed)
[place] [address] [place], [name], [address] Phone: [phone]   Fax:  [phone]  Physical Therapy Treatment/Discharge Summary  Patient Details  Name: [name] MRN: [id number] Date of Birth: 02-18-1946 Referring Provider (PT): Hardie Pulley DPM   Encounter Date: 02/03/2021  PHYSICAL THERAPY DISCHARGE SUMMARY  Visits from Start of Care: 6  Current functional level related to goals / functional outcomes: See below   Remaining deficits: See below   Education / Equipment: See below  Plan: Patient agrees to discharge.  Patient goals were met. Patient is being discharged due to meeting the stated rehab goals.  ?????        PT End of Session - 02/03/21 0916    Visit Number 6    Number of Visits 12   1-2x/week for 6 weeks   Date for PT Re-Evaluation 02/09/21    Authorization Type [employer] (no vl, auth required    Authorization Time Period 12 visits requested 5/2-6/13    Authorization - Visit Number 6    Authorization - Number of Visits 12    Progress Note Due on Visit 10    PT Start Time 0917    PT Stop Time 0946    PT Time Calculation (min) 29 min    Activity Tolerance Patient tolerated treatment well    Behavior During Therapy [facility] for tasks assessed/performed           Past Medical History:  Diagnosis Date  . Blood clot of artery under arm ([place])   . Cyst on liver  . Depression   . Diabetes mellitus    controlled by diet  . Diverticulitis   . DVT of axillary vein, acute right ([place]) 11/24/2012   Dx on Jan 28, 2012.    . Fibrocystic breast disease   . Hepatic cyst   . Hypertension   . IBS (irritable bowel syndrome)   . Impaired glucose tolerance   . Menopause     Past Surgical History:  Procedure Laterality Date  . ABDOMINAL HYSTERECTOMY    . BACK SURGERY    . BREAST SURGERY  right   cyst removed  . CAST APPLICATION  04/06/8109   Procedure: CAST APPLICATION;  Surgeon: Carole Civil, MD;  Location: [facility] ORS;  Service: Orthopedics;  Laterality: Right;  procedure room   . CHOLECYSTECTOMY    . COLONOSCOPY  12/04/2004   NUR:Few small diverticula at sigmoid colon/Small cecal polyp ablated   . COLONOSCOPY N/A 09/24/2014   SLF: small internal hemorrhoids  . COLONOSCOPY N/A 02/15/2020   Procedure: COLONOSCOPY;  Surgeon: [name], MD;  Location: [facility] ENDO SUITE;  Service: Endoscopy;  Laterality: N/A;  8:30am  . ESOPHAGOGASTRODUODENOSCOPY N/A 02/02/2016   Procedure: ESOPHAGOGASTRODUODENOSCOPY (EGD);  Surgeon: Danie Binder, MD;  Location: [facility] ENDO [facility];  Service: Endoscopy;  Laterality: N/A;  930   . KNEE ARTHROCENTESIS  left  . POLYPECTOMY  02/15/2020   Procedure: POLYPECTOMY;  Surgeon: [name], MD;  Location: [facility] END[facility] SU[facility];  Service: Endoscopy;;  . TUBAL LIGATION      There were no vitals filed for this visit.   Subjective Assessment - 02/03/21 0917    Subjective Patient states her heel has been hurting. Her heel has been sore. Her foot/ankle got swollen last Thursday. Her MD cancelled her appt last. She has not been able to do stairs because her daughter has been out of town. She has all of her exercise sheets.  She has been doing HEP. She feels 90% improvement with PT intervention. She feels limited with stairs. She has been able to do anything she wants to do and has been able to walk to mailbox.    Limitations Standing;House hold activities;Walking;Lifting    Patient Stated Goals is to get herself situated so she can go on vacation and get to doing her own ADL    Currently in Pain? Yes    Pain Score 2     Pain Location Ankle    Pain Orientation Left    Pain Descriptors / Indicators Sore    Pain Type Surgical pain    Pain Onset More than a month ago    Pain Frequency Intermittent              [facility] PT Assessment - 02/03/21 0001      Assessment   Medical Diagnosis Sprain/repair of L ATFL    Referring Provider (PT) Hardie Pulley DPM    Onset  Date/Surgical Date 10/01/20    Next MD Visit June 10    Prior Therapy yes      Precautions   Precautions None      Restrictions   Weight Bearing Restrictions No      Balance Screen   Has the patient fallen in the past 6 months No    Has the patient had a decrease in activity level because of a fear of falling?  No    Is the patient reluctant to leave their home because of a fear of falling?  No      Prior Function   Level of Independence Independent    Vocation Retired      Charity fundraiser Status Within Functional Limits for tasks assessed      Observation/Other Assessments   Observations Ambulates with SPC    Focus on Therapeutic Outcomes (FOTO)  75% function      AROM   Left Ankle Dorsiflexion 5    Left Ankle Plantar Flexion 45    Left Ankle Inversion 30    Left Ankle Eversion 18      Ambulation/Gait   Ambulation/Gait Yes    Ambulation/Gait Assistance 6: Modified independent (Device/Increase time)    Ambulation Distance (Feet) 360 Feet    Assistive device Straight cane;None    Ambulation Surface Level;Indoor    Gait velocity decreased    Stairs Yes    Stair Management Technique One rail Right;One rail Left;Alternating pattern    Gait Comments 2MWT                                 PT Education - 02/03/21 0917    Education Details HEP, exercise mechanics, reassessment findings, returning to PT if needed, increasing walking times    Person(s) Educated Patient    Methods Explanation;Demonstration;Handout    Comprehension Verbalized understanding;Returned demonstration            PT Short Term Goals - 02/03/21 0929      PT SHORT TERM GOAL #1   Title Patient will be independent with HEP in order to improve functional outcomes.    Time 2    Period Weeks    Status Achieved    Target Date 01/12/21      PT SHORT TERM GOAL #2   Title Patient will report at least 25% improvement in symptoms for improved quality of life.     Time  2    Period Weeks    Status Achieved    Target Date 01/12/21             PT Long Term Goals - 02/03/21 0929      PT LONG TERM GOAL #1   Title Patient will report at least 75% improvement in symptoms for improved quality of life.    Time 6    Period Weeks    Status Achieved      PT LONG TERM GOAL #2   Title Patient will be able to ambulate at least 350 feet in 2MWT without AD in order to demonstrate improved tolerance to activity.    Time 6    Period Weeks    Status Partially Met      PT LONG TERM GOAL #3   Title Patient will improve FOTO score by at least 15 points in order to indicate improved tolerance to activity.    Time 6    Period Weeks    Status Achieved      PT LONG TERM GOAL #4   Title Patient will be able to navigate stairs with reciprocal pattern without compensation in order to demonstrate improved LE strength.    Time 6    Period Weeks    Status Achieved                 Plan - 02/03/21 0916    Clinical Impression Statement Patient has met all short and long term goals with ability to complete HEP and improvement in symptoms, gait, stairs, activity tolerance and functional mobility. Patient remains limited with stairs subjectively but has not attempted at home but is able to complete in clinic with good mechanics with UE use. Patient educated as listed above. Patient discharged from physical therapy at this time.    Personal Factors and Comorbidities Age;Fitness;Behavior Pattern;Past/Current Experience;Comorbidity 3+;Time since onset of injury/illness/exacerbation;Finances    Comorbidities Arthritis, Back pain, increased BMI, Diabetes, High Blood Pressure    Examination-Activity Limitations Locomotion Level;Transfers;Stand;Stairs;Squat;Lift;Hygiene/Grooming;Carry;Caring for Others;Bend    Examination-Participation Restrictions Cleaning;Occupation;Community Activity;Yard Work;Volunteer;Shop;Meal Prep;Church    Stability/Clinical Decision Making  Stable/Uncomplicated    Rehab Potential Good    PT Frequency --    PT Duration --    PT Treatment/Interventions ADLs/Self Care Home Management;Aquatic Therapy;Cryotherapy;Electrical Stimulation;Iontophoresis 71m/ml Dexamethasone;Moist Heat;Traction;Ultrasound;DME Instruction;Gait training;Stair training;Functional mobility training;Therapeutic activities;Therapeutic exercise;Balance training;Neuromuscular re-education;Patient/family education;Manual techniques;Dry needling;Energy conservation;Splinting;Taping;Spinal Manipulations;Joint Manipulations;Scar mobilization;Passive range of motion;Compression bandaging;Manual lymph drainage;Orthotic Fit/Training    PT Next Visit Plan n/a    PT Home Exercise Plan 5/2 tandem stance 5/4 TR, HR; 5/10 narrow BOS with head turns 6/7 SLS vectors, calf stretch    Consulted and Agree with Plan of Care Patient           Patient will benefit from skilled therapeutic intervention in order to improve the following deficits and impairments:  Abnormal gait,Decreased range of motion,Difficulty walking,Decreased endurance,Decreased activity tolerance,Pain,Decreased balance,Improper body mechanics,Decreased mobility,Decreased strength,Increased edema  Visit Diagnosis: Other abnormalities of gait and mobility  Pain in left ankle and joints of left foot  Other symptoms and signs involving the musculoskeletal system  Muscle weakness (generalized)     Problem List Patient Active Problem List   Diagnosis Date Noted  . Nausea without vomiting 04/17/2020  . History of adenomatous polyp of colon 01/16/2020  . Fibrocystic breast disease (FCBD) 05/14/2019  . Abdominal pain 01/15/2019  . Diarrhea 11/09/2018  . Depression, major, single episode, moderate ([place] 10/05/2018  . Hyperlipidemia associated with type 2 diabetes  mellitus ([place]) 06/06/2018  . Morbid obesity ([place]) 01/18/2018  . Encounter for dental exam and cleaning w/o abnormal findings 01/03/2018  . GERD  (gastroesophageal reflux disease) 09/05/2017  . Diverticulitis 05/04/2016  . IBS (irritable bowel syndrome) 12/29/2015  . Constipation 02/03/2015  . Dyspepsia   . Loss of weight   . Decreased appetite 09/16/2014  . Unintentional weight loss 09/16/2014  . Diabetes type 2, uncontrolled ([place]) 06/27/2013  . DVT of axillary vein, acute right ([place]) 11/24/2012  . Type 2 diabetes mellitus with diabetic neuropathy, without long-term current use of insulin ([place]) 11/15/2012  . Chest pain, unspecified 05/10/2011  . Bradycardia 05/10/2011  . HYPERCHOLESTEROLEMIA 08/29/2008  . DEPRESSION 08/29/2008  . Essential hypertension 08/29/2008  . PEPTIC ULCER DISEASE 08/29/2008  . HIATAL HERNIA 08/29/2008  . HEPATIC CYST 08/29/2008  . Osteoarthritis 08/29/2008    9:51 AM, 02/03/21 Mearl Latin PT, DPT Physical Therapist at [place], [name], [address] Phone: [phone]   Fax:  [phone]  Name: [name] MRN: [id number] Date of Birth: 1946/05/16

## 2021-02-06 ENCOUNTER — Ambulatory Visit (INDEPENDENT_AMBULATORY_CARE_PROVIDER_SITE_OTHER): Payer: Medicare HMO | Admitting: Podiatry

## 2021-02-06 ENCOUNTER — Other Ambulatory Visit: Payer: Self-pay

## 2021-02-06 DIAGNOSIS — M722 Plantar fascial fibromatosis: Secondary | ICD-10-CM

## 2021-02-06 DIAGNOSIS — M7662 Achilles tendinitis, left leg: Secondary | ICD-10-CM | POA: Diagnosis not present

## 2021-02-06 DIAGNOSIS — M216X9 Other acquired deformities of unspecified foot: Secondary | ICD-10-CM | POA: Diagnosis not present

## 2021-02-06 NOTE — Progress Notes (Signed)
  Subjective:  Patient ID: [name], female    DOB: 02/20/1946,  MRN: [id number]  Chief Complaint  Patient presents with   Routine Post Op    POV #6 DOS 10/01/2020 LT [place]. Pt states she has been having left heel pain. But overall doing well.    DOS: 10/01/20 Procedure: Left ankle ATFL repair/augmentation  75 y.o. female presents with the above complaint. History confirmed with patient.  Objective:  Physical Exam: mild peri-incisional tenderness. Good ankle strength but diminished range of motion of the ankle with pain palpation about posterior Achilles Incision: healed.  Assessment:   1. Achilles tendinitis of left lower extremity   2. Plantar fasciitis   3. Equinus deformity of foot    Plan:  Patient was evaluated and treated and all questions answered.  Post-operative State -Doing very well postoperatively not having any ankle pain or instability.  She is having pain to the heel dispensed night splint given ankle follow-up in 1 month for recheck  No follow-ups on file.

## 2021-02-06 NOTE — Patient Instructions (Signed)

## 2021-02-10 ENCOUNTER — Encounter (HOSPITAL_COMMUNITY): Payer: Medicare HMO | Admitting: Physical Therapy

## 2021-03-03 NOTE — Progress Notes (Signed)
Patient in office today and seen by EJ with [facility] for diabetic shoe measurements. Patient feet were measure and casted using a foam box at this time. [name] is treating the patient's diabetes at this time. Patient will be called when diabetic shoes are available for pick-up. Patient verbalized understanding.

## 2021-03-09 ENCOUNTER — Other Ambulatory Visit: Payer: Self-pay | Admitting: Family Medicine

## 2021-03-11 DIAGNOSIS — H524 Presbyopia: Secondary | ICD-10-CM | POA: Diagnosis not present

## 2021-03-11 DIAGNOSIS — Z0100 Encounter for examination of eyes and vision without abnormal findings: Secondary | ICD-10-CM | POA: Diagnosis not present

## 2021-03-13 ENCOUNTER — Emergency Department (HOSPITAL_COMMUNITY)
Admission: EM | Admit: 2021-03-13 | Discharge: 2021-03-13 | Disposition: A | Discharge status: Home / Self Care | Payer: Medicare HMO | Attending: Emergency Medicine | Admitting: Emergency Medicine

## 2021-03-13 ENCOUNTER — Telehealth: Payer: Self-pay | Admitting: Family Medicine

## 2021-03-13 ENCOUNTER — Emergency Department (HOSPITAL_COMMUNITY): Payer: Medicare HMO

## 2021-03-13 ENCOUNTER — Telehealth: Payer: Self-pay | Admitting: *Deleted

## 2021-03-13 ENCOUNTER — Other Ambulatory Visit: Payer: Self-pay

## 2021-03-13 DIAGNOSIS — R002 Palpitations: Secondary | ICD-10-CM | POA: Diagnosis not present

## 2021-03-13 DIAGNOSIS — R000 Tachycardia, unspecified: Secondary | ICD-10-CM | POA: Insufficient documentation

## 2021-03-13 DIAGNOSIS — Z79899 Other long term (current) drug therapy: Secondary | ICD-10-CM | POA: Insufficient documentation

## 2021-03-13 DIAGNOSIS — R059 Cough, unspecified: Secondary | ICD-10-CM | POA: Insufficient documentation

## 2021-03-13 DIAGNOSIS — I517 Cardiomegaly: Secondary | ICD-10-CM | POA: Diagnosis not present

## 2021-03-13 DIAGNOSIS — Z7902 Long term (current) use of antithrombotics/antiplatelets: Secondary | ICD-10-CM | POA: Insufficient documentation

## 2021-03-13 DIAGNOSIS — I10 Essential (primary) hypertension: Secondary | ICD-10-CM | POA: Insufficient documentation

## 2021-03-13 DIAGNOSIS — Z20822 Contact with and (suspected) exposure to covid-19: Secondary | ICD-10-CM | POA: Insufficient documentation

## 2021-03-13 DIAGNOSIS — R079 Chest pain, unspecified: Secondary | ICD-10-CM | POA: Diagnosis not present

## 2021-03-13 DIAGNOSIS — R001 Bradycardia, unspecified: Secondary | ICD-10-CM | POA: Diagnosis not present

## 2021-03-13 DIAGNOSIS — R61 Generalized hyperhidrosis: Secondary | ICD-10-CM | POA: Diagnosis not present

## 2021-03-13 DIAGNOSIS — E119 Type 2 diabetes mellitus without complications: Secondary | ICD-10-CM | POA: Diagnosis not present

## 2021-03-13 DIAGNOSIS — R110 Nausea: Secondary | ICD-10-CM | POA: Diagnosis not present

## 2021-03-13 DIAGNOSIS — R0602 Shortness of breath: Secondary | ICD-10-CM | POA: Insufficient documentation

## 2021-03-13 DIAGNOSIS — R0789 Other chest pain: Secondary | ICD-10-CM | POA: Diagnosis not present

## 2021-03-13 LAB — COMPREHENSIVE METABOLIC PANEL
ALT: 19 U/L (ref 0–44)
AST: 20 U/L (ref 15–41)
Albumin: 3.9 g/dL (ref 3.5–5.0)
Alkaline Phosphatase: 75 U/L (ref 38–126)
Anion gap: 7 (ref 5–15)
BUN: 16 mg/dL (ref 8–23)
CO2: 28 mmol/L (ref 22–32)
Calcium: 8.9 mg/dL (ref 8.9–10.3)
Chloride: 103 mmol/L (ref 98–111)
Creatinine, Ser: 0.82 mg/dL (ref 0.44–1.00)
GFR, Estimated: >60 mL/min (ref >60)
Glucose, Bld: 140 mg/dL — ABNORMAL HIGH (ref 70–99)
Potassium: 3.7 mmol/L (ref 3.5–5.1)
Sodium: 138 mmol/L (ref 135–145)
Total Bilirubin: 0.5 mg/dL (ref 0.3–1.2)
Total Protein: 7 g/dL (ref 6.5–8.1)

## 2021-03-13 LAB — CBC
HCT: 41.4 % (ref 36.0–46.0)
Hemoglobin: 12.9 g/dL (ref 12.0–15.0)
MCH: 25.8 pg — ABNORMAL LOW (ref 26.0–34.0)
MCHC: 31.2 g/dL (ref 30.0–36.0)
MCV: 82.8 fL (ref 80.0–100.0)
Platelets: 298 10*3/uL (ref 150–400)
RBC: 5 MIL/uL (ref 3.87–5.11)
RDW: 16.1 % — ABNORMAL HIGH (ref 11.5–15.5)
WBC: 7.1 10*3/uL (ref 4.0–10.5)
nRBC: 0 % (ref 0.0–0.2)

## 2021-03-13 LAB — RESP PANEL BY RT-PCR (FLU A&B, COVID) ARPGX2
Influenza A by PCR: NEGATIVE
Influenza B by PCR: NEGATIVE
SARS Coronavirus 2 by RT PCR: NEGATIVE

## 2021-03-13 LAB — TSH: TSH: 1.838 u[IU]/mL (ref 0.350–4.500)

## 2021-03-13 LAB — TROPONIN I (HIGH SENSITIVITY)
Troponin I (High Sensitivity): 4 ng/L (ref <18)
Troponin I (High Sensitivity): 4 ng/L (ref <18)

## 2021-03-13 LAB — CBG MONITORING, ED: Glucose-Capillary: 100 mg/dL — ABNORMAL HIGH (ref 70–99)

## 2021-03-13 NOTE — ED Provider Notes (Signed)
[facility] EMERGENCY DEPARTMENT Provider Note   CSN: [id number] Arrival date & time: 03/13/21  1230     History Chief Complaint  Patient presents with   Tachycardia    [name] is a 75 y.o. female.  HPI  75 year old female with a history of DVT, depression, diabetes, cyst, IBS, hypertension, fibrocystic breast disease, who presents to the emergency department today for evaluation of palpitations.  Patient states she started feeling palpitations yesterday.  She also has some discomfort to the center of her chest and a little bit to the left.  It felt different than the pain she is experienced from her fibrocystic breast disease in the past.  She had some associated shortness of breath, diaphoresis and nausea.  She is had no vomiting or abdominal pain.  She has had no cough.  She does note that her air conditioner was out for the last few days that she thinks that is why which she was sweating at night.  Past Medical History:  Diagnosis Date   Blood clot of artery under arm ([place])    Cyst on liver   Depression    Diabetes mellitus    controlled by diet   Diverticulitis    DVT of axillary vein, acute right ([place]) 11/24/2012   Dx on Jan 28, 2012.     Fibrocystic breast disease    Hepatic cyst    Hypertension    IBS (irritable bowel syndrome)    Impaired glucose tolerance    Menopause     Patient Active Problem List   Diagnosis Date Noted   Nausea without vomiting 04/17/2020   History of adenomatous polyp of colon 01/16/2020   Fibrocystic breast disease (FCBD) 05/14/2019   Abdominal pain 01/15/2019   Diarrhea 11/09/2018   Depression, major, single episode, moderate ([place]) 10/05/2018   Hyperlipidemia associated with type 2 diabetes mellitus ([place]) 06/06/2018   Morbid obesity ([place]) 01/18/2018   Encounter for dental exam and cleaning w/o abnormal findings 01/03/2018   GERD (gastroesophageal reflux disease) 09/05/2017   Diverticulitis 05/04/2016   IBS (irritable  bowel syndrome) 12/29/2015   Constipation 02/03/2015   Dyspepsia    Loss of weight    Decreased appetite 09/16/2014   Unintentional weight loss 09/16/2014   Diabetes type 2, uncontrolled ([place]) 06/27/2013   DVT of axillary vein, acute right ([place]) 11/24/2012   Type 2 diabetes mellitus with diabetic neuropathy, without long-term current use of insulin ([place]) 11/15/2012   Chest pain, unspecified 05/10/2011   Bradycardia 05/10/2011   HYPERCHOLESTEROLEMIA 08/29/2008   DEPRESSION 08/29/2008   Essential hypertension 08/29/2008   PEPTIC ULCER DISEASE 08/29/2008   HIATAL HERNIA 08/29/2008   HEPATIC CYST 08/29/2008   Osteoarthritis 08/29/2008    Past Surgical History:  Procedure Laterality Date   ABDOMINAL HYSTERECTOMY     BACK SURGERY     BREAST SURGERY  right   cyst removed   CAST APPLICATION  7/61/6073   Procedure: CAST APPLICATION;  Surgeon: Carole Civil, MD;  Location: [facility] ORS;  Service: Orthopedics;  Laterality: Right;  procedure room    CHOLECYSTECTOMY     COLONOSCOPY  12/04/2004   NUR:Few small diverticula at sigmoid colon/Small cecal polyp ablated    COLONOSCOPY N/A 09/24/2014   SLF: small internal hemorrhoids   COLONOSCOPY N/A 02/15/2020   Procedure: COLONOSCOPY;  Surgeon: [name], MD;  Location: [facility] ENDO SUITE;  Service: Endoscopy;  Laterality: N/A;  8:30am   ESOPHAGOGASTRODUODENOSCOPY N/A 02/02/2016   Procedure: ESOPHAGOGASTRODUODENOSCOPY (EGD);  Surgeon: Carlyon Prows  Rexene Edison, MD;  Location: [facility] ENDO SUITE;  Service: Endoscopy;  Laterality: N/A;  930    KNEE ARTHROCENTESIS  left   POLYPECTOMY  02/15/2020   Procedure: POLYPECTOMY;  Surgeon: [name], MD;  Location: [facility] END[facility] SU[facility];  Service: Endoscopy;;   TUBAL LIGATION       OB History     Gravida  3   Para  3   Term  3   Preterm      AB      Living  3      SAB      IAB      Ectopic      Multiple      Live Births              Family History  Problem Relation Age of Onset   Stroke  Mother    Heart attack Mother    Cancer Sister    Diabetes Sister    Seizures Brother    Diabetes Brother    Heart disease Brother    Kidney disease Son    Colon cancer Neg Hx     Social History   Tobacco Use   Smoking status: Never   Smokeless tobacco: Never   Tobacco comments:    Never smoked  Vaping Use   Vaping Use: Never used  Substance Use Topics   Alcohol use: No    Alcohol/week: 0.0 standard drinks   Drug use: No    Home Medications Prior to Admission medications   Medication Sig Start Date End Date Taking? Authorizing Provider  acetaminophen (TYLENOL) 500 MG tablet Take 1,000 mg by mouth every 6 (six) hours as needed for mild pain, moderate pain or headache.     [provider]  clopidogrel (PLAVIX) 75 MG tablet TAKE 1 TABLET (75 MG TOTAL) BY MOUTH DAILY. 12/02/20   Arnoldo Lenis, MD  glucose blood (PRODIGY NO CODING BLOOD GLUC) test strip USE AS DIRECTED ONE TIME DAILY 03/11/21   [name], MD  losartan (COZAAR) 50 MG tablet Take 1 tablet (50 mg total) by mouth daily. 08/06/20   Strader, Fransisco Hertz, PA-C  Multiple Vitamins-Minerals (MULTIVITAMIN WITH MINERALS) tablet Take 1 tablet by mouth daily.    [provider]  pantoprazole (PROTONIX) 40 MG tablet TAKE 1 TABLET EVERY DAY 09/16/20   [name], MD  pravastatin (PRAVACHOL) 20 MG tablet TAKE 1 TABLET AT BEDTIME 09/16/20   [name], MD    Allergies    Norvasc [amlodipine besylate], Penicillins, Advair diskus [fluticasone-salmeterol], and Metformin and related  Review of Systems   Review of Systems  Constitutional:  Positive for diaphoresis. Negative for fever.  HENT:  Negative for ear pain and sore throat.   Eyes:  Negative for visual disturbance.  Respiratory:  Positive for shortness of breath. Negative for cough.   Cardiovascular:  Positive for chest pain and palpitations. Negative for leg swelling.  Gastrointestinal:  Positive for nausea. Negative for abdominal pain,  constipation, diarrhea and vomiting.  Genitourinary:  Negative for dysuria and hematuria.  Musculoskeletal:  Negative for back pain.  Skin:  Negative for color change and rash.  Neurological:  Negative for seizures and syncope.  All other systems reviewed and are negative.  Physical Exam Updated Vital Signs BP (!) 136/46   Pulse (!) 43   Temp 97.8 F (36.6 C) (Oral)   Resp 14   SpO2 100%   Physical Exam Vitals and nursing note reviewed.  Constitutional:      General: She is not in acute distress.    Appearance: She is well-developed.  HENT:     Head: Normocephalic and atraumatic.  Eyes:     Conjunctiva/sclera: Conjunctivae normal.  Cardiovascular:     Rate and Rhythm: Normal rate and regular rhythm.     Heart sounds: Normal heart sounds. No murmur heard. Pulmonary:     Effort: Pulmonary effort is normal. No respiratory distress.     Breath sounds: Normal breath sounds. No wheezing, rhonchi or rales.  Abdominal:     General: Bowel sounds are normal.     Palpations: Abdomen is soft.     Tenderness: There is no abdominal tenderness. There is no guarding or rebound.  Musculoskeletal:     Cervical back: Neck supple.     Right lower leg: No edema.     Left lower leg: No edema.  Skin:    General: Skin is warm and dry.  Neurological:     Mental Status: She is alert.    ED Results / Procedures / Treatments   Labs (all labs ordered are listed, but only abnormal results are displayed) Labs Reviewed  CBC - Abnormal; Notable for the following components:      Result Value   MCH 25.8 (*)    RDW 16.1 (*)    All other components within normal limits  COMPREHENSIVE METABOLIC PANEL - Abnormal; Notable for the following components:   Glucose, Bld 140 (*)    All other components within normal limits  CBG MONITORING, ED - Abnormal; Notable for the following components:   Glucose-Capillary 100 (*)    All other components within normal limits  RESP PANEL BY RT-PCR (FLU A&B,  COVID) ARPGX2  TSH  TROPONIN I (HIGH SENSITIVITY)  TROPONIN I (HIGH SENSITIVITY)    EKG EKG Interpretation  Date/Time:  Friday March 13 2021 12:52:53 EDT Ventricular Rate:  55 PR Interval:  170 QRS Duration: 145 QT Interval:  468 QTC Calculation: 419 R Axis:   -52 Text Interpretation: Sinus bradycardia Atrial premature complexes in couplets RBBB and LAFB Baseline wander in lead(s) II Since last tracing rate faster Confirmed by Noemi Chapel [phone]) on 03/13/2021 1:21:32 PM  Radiology DG Chest Port 1 View  Result Date: 03/13/2021 CLINICAL DATA:  Chest pain EXAM: PORTABLE CHEST 1 VIEW COMPARISON:  12/13/2017 FINDINGS: Cardiomegaly. Both lungs are clear. The visualized skeletal structures are unremarkable. IMPRESSION: Cardiomegaly without acute abnormality of the lungs in AP portable projection. Electronically Signed   By: Eddie Candle M.D.   On: 03/13/2021 14:21    Procedures Procedures   5:20 PM Cardiac monitoring reveals bradycardia, junctional rhythm in the 50s (Rate & rhythm), as reviewed and interpreted by me. Cardiac monitoring was ordered due to bradycardia and to monitor patient for dysrhythmia.   Medications Ordered in ED Medications - No data to display  ED Course  I have reviewed the triage vital signs and the nursing notes.  Pertinent labs & imaging results that were available during my care of the patient were reviewed by me and considered in my medical decision making (see chart for details).    MDM Rules/Calculators/A&P                          75 y/o female presents for eval of palpitations, chest pain  Reviewed/interpreted labs CBC wnl BMP unremakrable Trop neg TSH wnl COVID neg  Ekg - Sinus bradycardia Atrial premature complexes  in couplets RBBB and LAFB Baseline wander in lead(s) II Since last tracing rate faster   CXR - Cardiomegaly without acute abnormality of the lungs in AP portable projection  4:04 PM CONSULT with Dr. Harl Bowie. He will review  patients chart and call back.   5:10 PM CONSULT with Dr. Harl Bowie who evaluated the patient in the emergency department.  He does not feel that the patient symptoms are consistent with ACS and her cardiac work-up thus far with her troponins are reassuring.  He states that he sees no emergent need for a pacemaker and had a discussion with the patient who is in agreement with this.  He will set the patient up with an outpatient monitor and have her follow-up with EP.  Previous this patient and discussed plan for discharge with close follow-up with cardiology.  She voices understanding of the plan and reasons to return.  All questions answered.  Patient stable for discharge  Final Clinical Impression(s) / ED Diagnoses Final diagnoses:  Palpitations  Bradycardia    Rx / DC Orders ED Discharge Orders     None        Bishop Dublin 03/13/21 1720    Noemi Chapel, MD 03/13/21 2025

## 2021-03-13 NOTE — ED Triage Notes (Signed)
Chest pain last night , feeling weak today

## 2021-03-13 NOTE — Telephone Encounter (Signed)
Patient called and stated she was laying down and sat up and felt like she was going to pass out and was soaking wet- sugar was 139- Patient states she feels like her heart is running away with her. Advised patient to go straight to ER- call ambulance or have someone drive her- do not drive. Patient verbalized understanding and is heading to the ER.

## 2021-03-13 NOTE — Telephone Encounter (Signed)
Error

## 2021-03-13 NOTE — Consult Note (Signed)
Cardiology Consultation:   Patient ID: [name] MRN: [id number]; DOB: 07/17/1946  Admit date: 03/13/2021 Date of Consult: 03/13/2021  PCP:  [name], MD   [facility] Providers Cardiologist:  [name], MD   {    Patient Profile:   [name] is a 75 y.o. female with a hx of chest pain, hyperlipidemia, HTN who is being seen 03/13/2021 for the evaluation of chest pain and bradycardia  at the request of Dr Sabra Heck.  History of Present Illness:   [name] 75 yo female history of chest pain, hyperlipidemia, HTN presents with chest pain, nausea, weakness. She has a very long history of chest pain with caths in 2005 and 2012 showing just mild disease, nuclear stress 07/2016 and 05/2019 without ischemia.   Reports episode started last night. Awoke her from sleep feeling nauseous, weak, sweaty, with a 3-4/10 left sided discomfort. Had some lightheadedness/dizziness. Checked her blood sugar and it was 139. Had some improvement with antacid and getting up and walking around. Ate some breakfast, felt drained most of the morning. Came to ER for evaluation. Has had some dizziness but no syncope. In ER noted to be bradycardic, with tele showing sinus brady, junction rhythnm at times to the mid 40s. Stable blood pressures. Of note was noted to be bradycardic in clinic 07/2020, however rates normalized with ambulation. She reports similar pattern to her heart rates at home   Past Medical History:  Diagnosis Date   Blood clot of artery under arm ([place])    Cyst on liver   Depression    Diabetes mellitus    controlled by diet   Diverticulitis    DVT of axillary vein, acute right ([place]) 11/24/2012   Dx on Jan 28, 2012.     Fibrocystic breast disease    Hepatic cyst    Hypertension    IBS (irritable bowel syndrome)    Impaired glucose tolerance    Menopause     Past Surgical History:  Procedure Laterality Date   ABDOMINAL HYSTERECTOMY     BACK SURGERY      BREAST SURGERY  right   cyst removed   CAST APPLICATION  01/29/931   Procedure: CAST APPLICATION;  Surgeon: Carole Civil, MD;  Location: [facility] ORS;  Service: Orthopedics;  Laterality: Right;  procedure room    CHOLECYSTECTOMY     COLONOSCOPY  12/04/2004   NUR:Few small diverticula at sigmoid colon/Small cecal polyp ablated    COLONOSCOPY N/A 09/24/2014   SLF: small internal hemorrhoids   COLONOSCOPY N/A 02/15/2020   Procedure: COLONOSCOPY;  Surgeon: [name], MD;  Location: [facility];  Service: Endoscopy;  Laterality: N/A;  8:30am   ESOPHAGOGASTRODUODENOSCOPY N/A 02/02/2016   Procedure: ESOPHAGOGASTRODUODENOSCOPY (EGD);  Surgeon: Danie Binder, MD;  Location: [facility];  Service: Endoscopy;  Laterality: N/A;  930    KNEE ARTHROCENTESIS  left   POLYPECTOMY  02/15/2020   Procedure: POLYPECTOMY;  Surgeon: [name], MD;  Location: [facility];  Service: Endoscopy;;   TUBAL LIGATION         Inpatient Medications: Scheduled Meds:  Continuous Infusions:  PRN Meds:   Allergies:    Allergies  Allergen Reactions   Norvasc [Amlodipine Besylate] Other (See Comments)    Reaction:  Fatigue    Penicillins Hives and Other (See Comments)    Has patient had a PCN reaction causing immediate rash, facial/tongue/throat swelling, SOB or lightheadedness with hypotension: Yes Has patient had a  PCN reaction causing severe rash involving mucus membranes or skin necrosis: No Has patient had a PCN reaction that required hospitalization No Has patient had a PCN reaction occurring within the last 10 years: No If all of the above answers are "NO", then may proceed with Cephalosporin use.    Advair Diskus [Fluticasone-Salmeterol] Palpitations   Metformin And Related Other (See Comments)    Reaction:  GI upset     Social History:   Social History   Socioeconomic History   Marital status: Widowed    Spouse name: Not on file   Number of children: Not on file   Years of  education: Not on file   Highest education level: Not on file  Occupational History   Occupation: Retired  Tobacco Use   Smoking status: Never   Smokeless tobacco: Never   Tobacco comments:    Never smoked  Vaping Use   Vaping Use: Never used  Substance and Sexual Activity   Alcohol use: No    Alcohol/week: 0.0 standard drinks   Drug use: No   Sexual activity: Yes  Other Topics Concern   Not on file  Social History Narrative   Not on file   Social Determinants of Health   Financial Resource Strain: Not on file  Food Insecurity: Not on file  Transportation Needs: Not on file  Physical Activity: Not on file  Stress: Not on file  Social Connections: Not on file  Intimate Partner Violence: Not on file    Family History:    Family History  Problem Relation Age of Onset   Stroke Mother    Heart attack Mother    Cancer Sister    Diabetes Sister    Seizures Brother    Diabetes Brother    Heart disease Brother    Kidney disease Son    Colon cancer Neg Hx      ROS:  Please see the history of present illness.  All other ROS reviewed and negative.     Physical Exam/Data:   Vitals:   03/13/21 1415 03/13/21 1430 03/13/21 1500 03/13/21 1530  BP: (!) 139/51 (!) 140/50 (!) 137/54 (!) 133/52  Pulse: (!) 44 (!) 44 (!) 46 (!) 41  Resp: 16 14 15 11   Temp:      TempSrc:      SpO2: 100% 100% 100% 100%   No intake or output data in the 24 hours ending 03/13/21 1641 Last 3 Weights 01/22/2021 12/23/2020 09/22/2020  Weight (lbs) 232 lb 6.4 oz (No Data) 227 lb  Weight (kg) 105.416 kg (No Data) 102.967 kg     There is no height or weight on file to calculate BMI.  General:  Well nourished, well developed, in no acute distress HEENT: normal Lymph: no adenopathy Neck: no JVD Endocrine:  No thryomegaly Vascular: No carotid bruits; FA pulses 2+ bilaterally without bruits  Cardiac:  normal S1, S2; RRR; no murmur Lungs:  clear to auscultation bilaterally, no wheezing, rhonchi or  rales  Abd: soft, nontender, no hepatomegaly  Ext: no edema Musculoskeletal:  No deformities, BUE and BLE strength normal and equal Skin: warm and dry  Neuro:  CNs 2-12 intact, no focal abnormalities noted Psych:  Normal affect     Laboratory Data:  High Sensitivity Troponin:   Recent Labs  Lab 03/13/21 1335 03/13/21 1541  TROPONINIHS 4 4     Chemistry Recent Labs  Lab 03/13/21 1335  NA 138  K 3.7  CL 103  CO2 28  GLUCOSE 140*  BUN 16  CREATININE 0.82  CALCIUM 8.9  GFRNONAA >60  ANIONGAP 7    Recent Labs  Lab 03/13/21 1335  PROT 7.0  ALBUMIN 3.9  AST 20  ALT 19  ALKPHOS 75  BILITOT 0.5   Hematology Recent Labs  Lab 03/13/21 1335  WBC 7.1  RBC 5.00  HGB 12.9  HCT 41.4  MCV 82.8  MCH 25.8*  MCHC 31.2  RDW 16.1*  PLT 298   BNPNo results for input(s): BNP, PROBNP in the last 168 hours.  DDimer No results for input(s): DDIMER in the last 168 hours.   Radiology/Studies:  DG Chest Port 1 View  Result Date: 03/13/2021 CLINICAL DATA:  Chest pain EXAM: PORTABLE CHEST 1 VIEW COMPARISON:  12/13/2017 FINDINGS: Cardiomegaly. Both lungs are clear. The visualized skeletal structures are unremarkable. IMPRESSION: Cardiomegaly without acute abnormality of the lungs in AP portable projection. Electronically Signed   By: Eddie Candle M.D.   On: 03/13/2021 14:21     Assessment and Plan:   Chest pain -long history of chest pain with multiple caths and nuclear stress tests that have been benign - recent episode with signicant nausea, better with antacids.  - EKG with nonspecific Twave chagnes, partly in the setting of a retrograde p wave with junctional beats, her initial EKG in SR has normal Twaves. Hstrops are normal with out any delta change.  - her bradycardia would not cause chest pain - reports symptoms are similar to her prior chest pain symptoms over the years, no repeat ischemic testing planned at this time  2. Bradycardia - has had prior signs of  conduction disease with bifascicular block, was brady in clinic 07/2020 but rates normalized with ambulation - telemetry here shows sinus brady to 50s, jnctional rhythm to mid 40s - she is not on any av nodal agents - her bradycardia would not explain her episode of waking up at night with chest pain, nausea. Could be related to some of her fatigue at times. She denies any presyncope or syncope.  - this is not acute bradycardia, has been progressing over time.  - we discussed could consider transfer to [facility] today to undergo inpatient pacemaker evaluation, she turns this down at this time. We will arrange an outpatient 7 day zio patch and outpatient EP appointment.     For questions or updates, please contact [place] Please consult [email or web address] for contact info under    Signed, [name], MD  03/13/2021 4:41 PM

## 2021-03-13 NOTE — Discharge Instructions (Addendum)
Please follow-up with the electrophysiologist as recommended by cardiology.  Please monitor your symptoms closely and if you have any new or worsening symptoms please return to the emergency department immediately.

## 2021-03-16 ENCOUNTER — Other Ambulatory Visit: Payer: Self-pay | Admitting: Family Medicine

## 2021-03-17 ENCOUNTER — Other Ambulatory Visit: Payer: Self-pay | Admitting: Cardiology

## 2021-03-17 ENCOUNTER — Telehealth: Payer: Self-pay

## 2021-03-17 ENCOUNTER — Ambulatory Visit (INDEPENDENT_AMBULATORY_CARE_PROVIDER_SITE_OTHER): Payer: Medicare HMO

## 2021-03-17 DIAGNOSIS — R002 Palpitations: Secondary | ICD-10-CM

## 2021-03-17 NOTE — Telephone Encounter (Signed)
-----   Message from Erma Heritage, [place] sent at 03/13/2021  4:59 PM EDT ----- Regarding: Outpatient Zio Patch Placement This patient was evaluated in the ED by Dr. Harl Bowie this afternoon and needs a 7-day Zio patch per him for bradycardia. He wanted her to see Dr. Lovena Le as well and I went ahead and scheduled a visit for 8/23.   Thanks for your help!  [place],  [place]

## 2021-03-17 NOTE — Telephone Encounter (Signed)
7 day Zio monitor ordered per B. Strader, PA-C/ Dr. Harl Bowie and shipped to pt's home.

## 2021-03-20 ENCOUNTER — Ambulatory Visit (INDEPENDENT_AMBULATORY_CARE_PROVIDER_SITE_OTHER): Payer: Medicare HMO | Admitting: Podiatry

## 2021-03-20 ENCOUNTER — Other Ambulatory Visit: Payer: Self-pay

## 2021-03-20 DIAGNOSIS — M216X9 Other acquired deformities of unspecified foot: Secondary | ICD-10-CM

## 2021-03-20 DIAGNOSIS — M7662 Achilles tendinitis, left leg: Secondary | ICD-10-CM

## 2021-03-20 DIAGNOSIS — M722 Plantar fascial fibromatosis: Secondary | ICD-10-CM

## 2021-03-20 MED ORDER — DICLOFENAC SODIUM 1 % EX GEL: 2.0000 g | Freq: Four times a day (QID) | CUTANEOUS | 1 refills | Status: DC

## 2021-03-20 NOTE — Progress Notes (Signed)
  Subjective:  Patient ID: [name], female    DOB: 1946/01/02,  MRN: [id number]  Chief Complaint  Patient presents with   Follow-up    4-6 weeks tendonitis f/u   DOS: 10/01/20 Procedure: Left ankle ATFL repair/augmentation  75 y.o. female presents with the above complaint. History confirmed with patient.  Objective:  Physical Exam: mild peri-incisional tenderness. Good ankle strength but diminished range of motion of the ankle with pain palpation about posterior Achilles Incision: healed.  Assessment:   1. Achilles tendinitis of left lower extremity   2. Plantar fasciitis   3. Equinus deformity of foot     Plan:   Left ankle insufficiency, with Achilles tendonitis -Doing very well without issues from her ankle surgery but still having pain in the back of the tendon. We discussed continued balance and proprioception exercises as she does endorse a fear of falling. She is still using her cane and I advised after allowing several weeks of her exercises she can try going without the cane.  -Rx voltaren gel for the tendonitis.    Return in about 2 months (around 05/21/2021).

## 2021-03-21 ENCOUNTER — Other Ambulatory Visit: Payer: Self-pay | Admitting: Family Medicine

## 2021-03-24 ENCOUNTER — Other Ambulatory Visit: Payer: Self-pay

## 2021-03-24 ENCOUNTER — Ambulatory Visit: Payer: Medicare HMO | Admitting: Gastroenterology

## 2021-03-24 ENCOUNTER — Ambulatory Visit (INDEPENDENT_AMBULATORY_CARE_PROVIDER_SITE_OTHER): Payer: Medicare HMO

## 2021-03-24 ENCOUNTER — Encounter: Payer: Self-pay | Admitting: Gastroenterology

## 2021-03-24 ENCOUNTER — Encounter: Payer: Self-pay | Admitting: Internal Medicine

## 2021-03-24 VITALS — BP 133/66 | HR 45 | Temp 97.8°F | Ht 65.0 in | Wt 235.6 lb

## 2021-03-24 DIAGNOSIS — R002 Palpitations: Secondary | ICD-10-CM | POA: Diagnosis not present

## 2021-03-24 DIAGNOSIS — K5900 Constipation, unspecified: Secondary | ICD-10-CM

## 2021-03-24 DIAGNOSIS — K219 Gastro-esophageal reflux disease without esophagitis: Secondary | ICD-10-CM | POA: Diagnosis not present

## 2021-03-24 MED ORDER — LINACLOTIDE 145 MCG PO CAPS: 145.0000 ug | ORAL_CAPSULE | Freq: Every day | ORAL | 5 refills | Status: DC

## 2021-03-24 NOTE — Progress Notes (Signed)
Primary Care Physician: [name], MD  Primary Gastroenterologist:  [name], DO   Chief Complaint  Patient presents with   Constipation    Had some old Linzess-but doesn't help. Unable to afford copay for Linzess Has one dose of Oxycodone left from foot surgery (unsure of strength)    HPI: [name] is a 75 y.o. female here for follow-up.  Patient last seen in August 2021.  She has a history of GERD, IBS constipation, history of colon polyps.  Colonoscopy June 2021: Single 4 mm polyp in the cecum, tubular adenoma removed.  Diverticulosis in the entire examined colon.  Nonbleeding internal hemorrhoids.  Recommended no future colonoscopies unless new symptoms develop.  Previously tried and failed Colace with without MiraLAX, lactulose (bloating, inconsistent), Amitiza, multiple over-the-counter options.  Trulance would cost $2000.  Patient had ankle surgery back in February.  States that she was basically in a wheelchair for 3 months, unable to bear weight.  Had to depend on everyone to bring her food.  Notes that her diet has not been very good since then.  She is put on 7 pounds.  Feels like she is not getting adequate dietary fiber.  Not getting enough water in her diet.  Has been using pain medication 4 times daily but has weaned off.  Only has 1 more pill.  Released from physical therapy last Friday, now walking with a cane.  Hopeful that she can resume prior high-fiber diet due to increased physical activity.  Prior to surgery her bowel function had been doing pretty well with dietary measures.  Now having a lot of problems with feeling bloated and full.  Bowel movements unremarkable.  Does not go daily.  Often strains.  No melena or rectal bleeding.  States she has not had a good bowel movement in a couple weeks.  Has tried to correct all the causes abdominal cramping.  Just came from the cardiologist office, 7-day heart monitor placed for bradycardia.   States she was advised she needed a pacemaker but she does not want to have it done.  Follows up with Dr. Lovena Le in the near future.  Saw her regular cardiologist today.       Current Outpatient Medications  Medication Sig Dispense Refill   acetaminophen (TYLENOL) 500 MG tablet Take 1,000 mg by mouth every 6 (six) hours as needed for mild pain, moderate pain or headache.      clopidogrel (PLAVIX) 75 MG tablet TAKE 1 TABLET (75 MG TOTAL) BY MOUTH DAILY. 90 tablet 3   diclofenac Sodium (VOLTAREN) 1 % GEL Apply 2 g topically 4 (four) times daily. 100 g 1   glucose blood (PRODIGY NO CODING BLOOD GLUC) test strip USE AS DIRECTED ONE TIME DAILY 100 strip 12   linaclotide (LINZESS) 145 MCG CAPS capsule Take 145 mcg by mouth as needed.     losartan (COZAAR) 50 MG tablet Take 1 tablet (50 mg total) by mouth daily. 90 tablet 3   Multiple Vitamins-Minerals (MULTIVITAMIN WITH MINERALS) tablet Take 1 tablet by mouth daily.     pantoprazole (PROTONIX) 40 MG tablet TAKE 1 TABLET EVERY DAY 90 tablet 0   pravastatin (PRAVACHOL) 20 MG tablet TAKE 1 TABLET AT BEDTIME 90 tablet 1   No current facility-administered medications for this visit.    Allergies as of 03/24/2021 - Review Complete 03/24/2021  Allergen Reaction Noted   Norvasc [amlodipine besylate] Other (See Comments) 11/21/2012   Penicillins Hives and  Other (See Comments)    Advair diskus [fluticasone-salmeterol] Palpitations 11/21/2012   Metformin and related Other (See Comments) 09/28/2013    ROS:  General: Negative for anorexia, weight loss, fever, chills, fatigue, weakness. ENT: Negative for hoarseness, difficulty swallowing , nasal congestion. CV: Negative for chest pain, angina, palpitations, dyspnea on exertion, peripheral edema.  Respiratory: Negative for dyspnea at rest, dyspnea on exertion, cough, sputum, wheezing.  GI: See history of present illness. GU:  Negative for dysuria, hematuria, urinary incontinence, urinary frequency,  nocturnal urination.  Endo: Negative for unusual weight change.    Physical Examination:   BP 133/66   Pulse (!) 45   Temp 97.8 F (36.6 C) (Temporal)   Ht '5\' 5"'$  (1.651 m)   Wt 235 lb 9.6 oz (106.9 kg)   BMI 39.21 kg/m   General: Well-nourished, well-developed in no acute distress.  Eyes: No icterus. Mouth: masked Abdomen: Bowel sounds are normal, nontender, nondistended, no hepatosplenomegaly or masses, no abdominal bruits or hernia , no rebound or guarding.   Extremities: No lower extremity edema. No clubbing or deformities. Neuro: Alert and oriented x 4   Skin: Warm and dry, no jaundice.   Psych: Alert and cooperative, normal mood and affect.  Labs:  Lab Results  Component Value Date   TSH 1.838 03/13/2021   Lab Results  Component Value Date   CREATININE 0.82 03/13/2021   BUN 16 03/13/2021   NA 138 03/13/2021   K 3.7 03/13/2021   CL 103 03/13/2021   CO2 28 03/13/2021   Lab Results  Component Value Date   ALT 19 03/13/2021   AST 20 03/13/2021   ALKPHOS 75 03/13/2021   BILITOT 0.5 03/13/2021   Lab Results  Component Value Date   WBC 7.1 03/13/2021   HGB 12.9 03/13/2021   HCT 41.4 03/13/2021   MCV 82.8 03/13/2021   PLT 298 03/13/2021     Imaging Studies: DG Chest Port 1 View  Result Date: 03/13/2021 CLINICAL DATA:  Chest pain EXAM: PORTABLE CHEST 1 VIEW COMPARISON:  12/13/2017 FINDINGS: Cardiomegaly. Both lungs are clear. The visualized skeletal structures are unremarkable. IMPRESSION: Cardiomegaly without acute abnormality of the lungs in [facility] portable projection. Electronically Signed   By: Eddie Candle M.D.   On: 03/13/2021 14:21     Assessment/Plan:  Constipation: poorly managed since surgery on ankle back in February, decreased activity, change in diet, frequent pain medication.  Recently released by PT for increased activity.  No longer on pain medication.  Goal of resuming high-fiber diet, increase water intake.  Resume Linzess 145 mcg daily as  needed.  She believes her medication coverage may be better with her current plan.  We have sent prescription to the pharmacy, she will let me know if still unaffordable.  Samples of 72 mcg capsules provided today (we were out of 145 mcg) to take 1 daily as needed.  May take 2 at a time if necessary to achieve good BM.  Return to the office in 6 months.  GERD: Well-controlled on pantoprazole daily.

## 2021-03-24 NOTE — Patient Instructions (Addendum)
Prescription sent for Linzess 145 mcg. If not affordable, let me know. We only have Linzess 40mg samples, I have provided you with some today. Try taking one daily as needed. If you don't have good results, then you can take two at one time until good bowel movement, then go back to once daily.  Continue pantoprazole once daily before breakfast.  Increase dietary fiber.  Increase water consumption, try at least 100 ounces daily.  Return to the office in six months or call sooner if needed.

## 2021-03-24 NOTE — Progress Notes (Signed)
Pt is being seen today for placement of a 7 day Zio monitor.

## 2021-04-02 ENCOUNTER — Telehealth: Payer: Self-pay | Admitting: Internal Medicine

## 2021-04-02 DIAGNOSIS — R002 Palpitations: Secondary | ICD-10-CM | POA: Diagnosis not present

## 2021-04-02 NOTE — Telephone Encounter (Signed)
Spoke to [place] at [employer].  She informed me that pt would only owe $45.  Tried to call pt but had to leave her a voice message to call me back.

## 2021-04-02 NOTE — Telephone Encounter (Signed)
Spoke to pt.  She could not afford $45.  Pt asked for samples.  Provided some and left up front along with coupon savings card.

## 2021-04-02 NOTE — Telephone Encounter (Signed)
Pt called to let LSL know that her Linzess prescription at [facility] was going to cost her $640. Please advise. [phone]

## 2021-04-13 ENCOUNTER — Encounter: Payer: Self-pay | Admitting: Nutrition

## 2021-04-13 ENCOUNTER — Other Ambulatory Visit: Payer: Self-pay

## 2021-04-13 ENCOUNTER — Encounter: Payer: Medicare HMO | Attending: Family Medicine | Admitting: Nutrition

## 2021-04-13 VITALS — Ht 65.0 in | Wt 236.0 lb

## 2021-04-13 DIAGNOSIS — E1140 Type 2 diabetes mellitus with diabetic neuropathy, unspecified: Secondary | ICD-10-CM | POA: Diagnosis not present

## 2021-04-13 DIAGNOSIS — E7800 Pure hypercholesterolemia, unspecified: Secondary | ICD-10-CM | POA: Insufficient documentation

## 2021-04-13 DIAGNOSIS — I10 Essential (primary) hypertension: Secondary | ICD-10-CM | POA: Diagnosis not present

## 2021-04-13 NOTE — Progress Notes (Signed)
Medical Nutrition Therapy:  Follow up  Appt start time: 0930 end time:  1000  Assessment:  Primary concerns today: Diabetes Type 2, Obesity, Hyperlipidemia, HTN. Lives by herself. She is a widow. Lost her husband 2 years ago.  Current diet is insuffient to meet her needs. She is skipping meals and eating more processed foods. Doesn't like to cook for just herself. Wants to lose weight but limited with activity due to foot surgery. A1C has improved from 9% down to 6.7%.  Not taking any DM medications. Controlling it with diet and lifestyle.  Had surgery on her foot in Feb 2022. Just got out of wheelchair/walker. Doing PT and doing water therapy. She notes her ortho dr. Michela Pitcher her ankle is doing well with healing. Gained 8 lbs.  She is working on getting that weight off now that she is able to be more active 7 day avg 122,14 day avg 120 and 28 day avg 113 mg/dl. She is no longer on her DM Medications.  Still taking cholesterol and Losartan was reduced to 50 mg a day.   Changed made: not eating as much. Not not snacking. Doesn't have an appetite at times. Denies being depressed. Trying to get her heart rate regulated.   Wt Readings from Last 3 Encounters:  03/24/21 235 lb 9.6 oz (106.9 kg)  01/22/21 232 lb 6.4 oz (105.4 kg)  09/22/20 227 lb (103 kg)   Ht Readings from Last 3 Encounters:  03/24/21 '5\' 5"'$  (1.651 m)  09/22/20 '5\' 5"'$  (1.651 m)  08/15/20 '5\' 5"'$  (1.651 m)   There is no height or weight on file to calculate BMI. '@BMIFA'$ @ Facility age limit for growth percentiles is 20 years. Facility age limit for growth percentiles is 20 years. CMP Latest Ref Rng & Units 03/13/2021 01/13/2021 08/01/2020  Glucose 70 - 99 mg/dL 140(H) 109(H) 100(H)  BUN 8 - 23 mg/dL '16 15 16  '$ Creatinine 0.44 - 1.00 mg/dL 0.82 0.79 0.98  Sodium 135 - 145 mmol/L 138 143 143  Potassium 3.5 - 5.1 mmol/L 3.7 4.8 4.5  Chloride 98 - 111 mmol/L 103 103 105  CO2 22 - 32 mmol/L '28 24 23  '$ Calcium 8.9 - 10.3 mg/dL 8.9 10.1  9.5  Total Protein 6.5 - 8.1 g/dL 7.0 6.9 -  Total Bilirubin 0.3 - 1.2 mg/dL 0.5 0.3 -  Alkaline Phos 38 - 126 U/L 75 78 -  AST 15 - 41 U/L 20 19 -  ALT 0 - 44 U/L 19 17 -   Lab Results  Component Value Date   HGBA1C 6.7 (H) 01/13/2021    Preferred Learning Style: No preference indicated   Learning Readiness:   Ready Change in progress   MEDICATIONS:   DIETARY INTAKE: 24-hr recall:  B ( AM): Bacon, egg biscuit, Diet Soda -usually: cherrios, fruit and toast,applesauce  L)  skipped: D) PB on toast-wasn't hungry  Usual physical activity:  Estimated energy needs: 1200  calories 135  g carbohydrates 90 g protein 42 g fat  Progress Towards Goal(s):  Resolved.   Nutritional Diagnosis:  NB-1.1 Food and nutrition-related knowledge deficit As related to Diabetes Type 2.  As evidenced by A1C 9%.    Intervention:  Meal planning, portion sizes and importance of not skipping meals..  Goals  Drink 4 bottles of water per day. Work on three meals per day Increase fresh fruits, vegetables and whole grains/dried beans Don't skip meals  Teaching Method Utilized:  Visual Auditory Hands on  Handouts given during  visit include: The Plate Method  Meal Plan Card Diabetes Instructions   Barriers to learning/adherence to lifestyle change: none  Demonstrated degree of understanding via:  Teach Back   Monitoring/Evaluation:  Dietary intake, exercise, , and body weight in 6 month(s).

## 2021-04-13 NOTE — Patient Instructions (Signed)
Goals  Drink 4 bottles of water per day. Work on three meals per day Increase fresh fruits, vegetables and whole grains/dried beans Don't skip meals

## 2021-04-21 ENCOUNTER — Ambulatory Visit (INDEPENDENT_AMBULATORY_CARE_PROVIDER_SITE_OTHER): Payer: Medicare HMO | Admitting: Internal Medicine

## 2021-04-21 ENCOUNTER — Encounter: Payer: Self-pay | Admitting: Internal Medicine

## 2021-04-21 ENCOUNTER — Other Ambulatory Visit: Payer: Self-pay

## 2021-04-21 VITALS — BP 150/60 | HR 83 | Ht 64.0 in | Wt 238.2 lb

## 2021-04-21 DIAGNOSIS — I495 Sick sinus syndrome: Secondary | ICD-10-CM

## 2021-04-21 DIAGNOSIS — R002 Palpitations: Secondary | ICD-10-CM | POA: Diagnosis not present

## 2021-04-21 NOTE — Patient Instructions (Signed)
Medication Instructions:  °Your physician recommends that you continue on your current medications as directed. Please refer to the Current Medication list given to you today. ° °*If you need a refill on your cardiac medications before your next appointment, please call your pharmacy* ° ° °Lab Work: °NONE  ° °If you have labs (blood work) drawn today and your tests are completely normal, you will receive your results only by: °MyChart Message (if you have MyChart) OR °A paper copy in the mail °If you have any lab test that is abnormal or we need to change your treatment, we will call you to review the results. ° ° °Testing/Procedures: °NONE  ° ° °Follow-Up: °At [facility], you and your health needs are our priority.  As part of our continuing mission to provide you with exceptional heart care, we have created designated Provider Care Teams.  These Care Teams include your primary Cardiologist (physician) and Advanced Practice Providers (APPs -  Physician Assistants and Nurse Practitioners) who all work together to provide you with the care you need, when you need it. ° °We recommend signing up for the patient portal called "MyChart".  Sign up information is provided on this After Visit Summary.  MyChart is used to connect with patients for Virtual Visits (Telemedicine).  Patients are able to view lab/test results, encounter notes, upcoming appointments, etc.  Non-urgent messages can be sent to your provider as well.   °To learn more about what you can do with MyChart, go to [email or web address].   ° °Your next appointment:   ° As Needed  ° °The format for your next appointment:   °In Person ° °Provider:   °Gregg Taylor, MD  ° ° °Other Instructions °Thank you for choosing [facility]! °  ° ° °

## 2021-04-21 NOTE — Progress Notes (Signed)
HPI [name] is referred today by Dr. Harl Bowie for evaluation of sinus node dysfunction. She has a h/o atypical chest pain with non-obstructive CAD by cath in 2005 and 2012. She has HTN and her bp has been fairly well controlled. She has occiasional palpitations and a cardiac monitor has  been worn which demonstrates mostly nocturnal sinus pauses, and non-sustained VT. She denies a h/o syncope or near syncope.  Allergies  Allergen Reactions   Norvasc [Amlodipine Besylate] Other (See Comments)    Reaction:  Fatigue    Penicillins Hives and Other (See Comments)    Has patient had a PCN reaction causing immediate rash, facial/tongue/throat swelling, SOB or lightheadedness with hypotension: Yes Has patient had a PCN reaction causing severe rash involving mucus membranes or skin necrosis: No Has patient had a PCN reaction that required hospitalization No Has patient had a PCN reaction occurring within the last 10 years: No If all of the above answers are "NO", then may proceed with Cephalosporin use.    Advair Diskus [Fluticasone-Salmeterol] Palpitations   Metformin And Related Other (See Comments)    Reaction:  GI upset      Current Outpatient Medications  Medication Sig Dispense Refill   acetaminophen (TYLENOL) 500 MG tablet Take 1,000 mg by mouth every 6 (six) hours as needed for mild pain, moderate pain or headache.      clopidogrel (PLAVIX) 75 MG tablet TAKE 1 TABLET (75 MG TOTAL) BY MOUTH DAILY. 90 tablet 3   diclofenac Sodium (VOLTAREN) 1 % GEL Apply 2 g topically 4 (four) times daily. 100 g 1   glucose blood (PRODIGY NO CODING BLOOD GLUC) test strip USE AS DIRECTED ONE TIME DAILY 100 strip 12   linaclotide (LINZESS) 145 MCG CAPS capsule Take 1 capsule (145 mcg total) by mouth daily before breakfast. 30 capsule 5   losartan (COZAAR) 50 MG tablet Take 1 tablet (50 mg total) by mouth daily. 90 tablet 3   Multiple Vitamins-Minerals (MULTIVITAMIN WITH MINERALS) tablet Take 1  tablet by mouth daily.     pantoprazole (PROTONIX) 40 MG tablet TAKE 1 TABLET EVERY DAY 90 tablet 0   pravastatin (PRAVACHOL) 20 MG tablet TAKE 1 TABLET AT BEDTIME 90 tablet 1   No current facility-administered medications for this visit.     Past Medical History:  Diagnosis Date   Blood clot of artery under arm ([place])    Cyst on liver   Depression    Diabetes mellitus    controlled by diet   Diverticulitis    DVT of axillary vein, acute right ([place]) 11/24/2012   Dx on Jan 28, 2012.     Fibrocystic breast disease    Hepatic cyst    Hypertension    IBS (irritable bowel syndrome)    Impaired glucose tolerance    Menopause     ROS:   All systems reviewed and negative except as noted in the HPI.   Past Surgical History:  Procedure Laterality Date   ABDOMINAL HYSTERECTOMY     BACK SURGERY     BREAST SURGERY  right   cyst removed   CAST APPLICATION  [id number]   Procedure: CAST APPLICATION;  Surgeon: Carole Civil, MD;  Location: [facility];  Service: Orthopedics;  Laterality: Right;  procedure room    CHOLECYSTECTOMY     COLONOSCOPY  12/04/2004   NUR:Few small diverticula at sigmoid colon/Small cecal polyp ablated    COLONOSCOPY N/A 09/24/2014   SLF: small internal hemorrhoids  COLONOSCOPY N/A 02/15/2020   Procedure: COLONOSCOPY;  Surgeon: [name], MD;  Location: [facility];  Service: Endoscopy;  Laterality: N/A;  8:30am   ESOPHAGOGASTRODUODENOSCOPY N/A 02/02/2016   Procedure: ESOPHAGOGASTRODUODENOSCOPY (EGD);  Surgeon: Danie Binder, MD;  Location: [facility];  Service: Endoscopy;  Laterality: N/A;  930    KNEE ARTHROCENTESIS  left   POLYPECTOMY  02/15/2020   Procedure: POLYPECTOMY;  Surgeon: [name], MD;  Location: [facility];  Service: Endoscopy;;   TUBAL LIGATION       Family History  Problem Relation Age of Onset   Stroke Mother    Heart attack Mother    Cancer Sister    Diabetes Sister    Seizures Brother    Diabetes Brother    Heart  disease Brother    Kidney disease Son    Colon cancer Neg Hx      Social History   Socioeconomic History   Marital status: Widowed    Spouse name: Not on file   Number of children: Not on file   Years of education: Not on file   Highest education level: Not on file  Occupational History   Occupation: Retired  Tobacco Use   Smoking status: Never   Smokeless tobacco: Never   Tobacco comments:    Never smoked  Vaping Use   Vaping Use: Never used  Substance and Sexual Activity   Alcohol use: No    Alcohol/week: 0.0 standard drinks   Drug use: No   Sexual activity: Yes  Other Topics Concern   Not on file  Social History Narrative   Not on file   Social Determinants of Health   Financial Resource Strain: Not on file  Food Insecurity: Not on file  Transportation Needs: Not on file  Physical Activity: Not on file  Stress: Not on file  Social Connections: Not on file  Intimate Partner Violence: Not on file     BP (!) 150/60   Pulse 83   Ht '5\' 4"'$  (1.626 m)   Wt 238 lb 3.2 oz (108 kg)   SpO2 100%   BMI 40.89 kg/m   Physical Exam:  Well appearing NAD HEENT: Unremarkable Neck:  No JVD, no thyromegally Lymphatics:  No adenopathy Back:  No CVA tenderness Lungs:  Clear with no wheezes HEART:  Regular rate rhythm, no murmurs, no rubs, no clicks Abd:  soft, positive bowel sounds, no organomegally, no rebound, no guarding Ext:  2 plus pulses, no edema, no cyanosis, no clubbing Skin:  No rashes no nodules Neuro:  CN II through XII intact, motor grossly intact  EKG - sinus brady with RBBB  DEVICE  Normal device function.  See PaceArt for details.   Assess/Plan:  Sinus node dysfunction - she appears to be asymptomatic at this time and her monitor shows mostly prolonged pauses at night time.  HTN - her bp is is not elevated.  Chest pain - she denies angina.

## 2021-04-22 ENCOUNTER — Encounter: Payer: Self-pay | Admitting: Nutrition

## 2021-04-27 ENCOUNTER — Encounter: Payer: Self-pay | Admitting: Family Medicine

## 2021-04-27 ENCOUNTER — Other Ambulatory Visit: Payer: Self-pay

## 2021-04-27 ENCOUNTER — Ambulatory Visit (INDEPENDENT_AMBULATORY_CARE_PROVIDER_SITE_OTHER): Payer: Medicare HMO | Admitting: Family Medicine

## 2021-04-27 VITALS — BP 132/76 | Temp 98.1°F | Wt 237.8 lb

## 2021-04-27 DIAGNOSIS — Z1231 Encounter for screening mammogram for malignant neoplasm of breast: Secondary | ICD-10-CM

## 2021-04-27 DIAGNOSIS — Z79899 Other long term (current) drug therapy: Secondary | ICD-10-CM

## 2021-04-27 DIAGNOSIS — I10 Essential (primary) hypertension: Secondary | ICD-10-CM

## 2021-04-27 DIAGNOSIS — E1169 Type 2 diabetes mellitus with other specified complication: Secondary | ICD-10-CM | POA: Diagnosis not present

## 2021-04-27 DIAGNOSIS — E785 Hyperlipidemia, unspecified: Secondary | ICD-10-CM | POA: Diagnosis not present

## 2021-04-27 DIAGNOSIS — R001 Bradycardia, unspecified: Secondary | ICD-10-CM | POA: Diagnosis not present

## 2021-04-27 DIAGNOSIS — E1140 Type 2 diabetes mellitus with diabetic neuropathy, unspecified: Secondary | ICD-10-CM | POA: Diagnosis not present

## 2021-04-27 NOTE — Progress Notes (Signed)
   Subjective:    Patient ID: [name], female    DOB: [birth year], 75 y.o.   MRN: [id number]  HPI Pt here for follow up on HTN and DM. Blood sugar this morning 99; yesterday morning 93; Wednesday 109. No headaches or blurred vision. Recently received new glasses. Checking sugars through out the week. Pt is going to [facility] twice a week.   She finds her self at times feeling sad and lonely because of the loss of her husband Denies being depressed currently  She had problems with bradycardia problems with woozy spell and was in the ER had an evaluation there in she had further evaluation with Dr. Lovena Le currently does not want to do pacemaker  She feels she does fairly well with dietary measures and exercising  Patient currently taking Linzess for her constipation but still has times where she is constipated we did recommend adding half a capful of MiraLAX daily to see if this would help not due for colonoscopy again until June 2026 has had a colonoscopy approximately 1 year ago Review of Systems     Objective:   Physical Exam General-in no acute distress Eyes-no discharge Lungs-respiratory rate normal, CTA CV-no murmurs, slight bradycardia slightly erratic Extremities skin warm dry no edema Neuro grossly normal Behavior normal, alert Diabetic foot exam completed       Assessment & Plan:  1. Type 2 diabetes mellitus with diabetic neuropathy, without long-term current use of insulin (HCC) She states her sugars have looked under good control recently we will recheck an A1c  2. Essential hypertension Blood pressure very good today.  3. Hyperlipidemia associated with type 2 diabetes mellitus ([place]) Continue cholesterol medicine check lipid profile  4. Bradycardia Heart rate in the 50s currently patient does not want pacemaker.  She was told by Dr. Lovena Le if she starts having dizziness passing out spells or other progressive troubles that she would need a pacemaker We  will geared towards seeing the patient back in November for recheck  If she has progressive symptoms in regards to feeling down and sad or blue we will go ahead with counseling she will think about it for now  Constipation-see above if ongoing troubles referral back to gastroenterology

## 2021-04-28 ENCOUNTER — Encounter: Payer: Self-pay | Admitting: Family Medicine

## 2021-04-28 LAB — BASIC METABOLIC PANEL
BUN/Creatinine Ratio: 19 (ref 12–28)
BUN: 18 mg/dL (ref 8–27)
CO2: 26 mmol/L (ref 20–29)
Calcium: 9.6 mg/dL (ref 8.7–10.3)
Chloride: 103 mmol/L (ref 96–106)
Creatinine, Ser: 0.95 mg/dL (ref 0.57–1.00)
Glucose: 101 mg/dL — ABNORMAL HIGH (ref 65–99)
Potassium: 4.8 mmol/L (ref 3.5–5.2)
Sodium: 141 mmol/L (ref 134–144)
eGFR: 63 mL/min/{1.73_m2} (ref >59)

## 2021-04-28 LAB — HEMOGLOBIN A1C
Est. average glucose Bld gHb Est-mCnc: 148 mg/dL
Hgb A1c MFr Bld: 6.8 % — ABNORMAL HIGH (ref 4.8–5.6)

## 2021-04-28 LAB — LIPID PANEL
Chol/HDL Ratio: 2.8 ratio (ref 0.0–4.4)
Cholesterol, Total: 156 mg/dL (ref 100–199)
HDL: 56 mg/dL (ref >39)
LDL Chol Calc (NIH): 85 mg/dL (ref 0–99)
Triglycerides: 81 mg/dL (ref 0–149)
VLDL Cholesterol Cal: 15 mg/dL (ref 5–40)

## 2021-04-30 ENCOUNTER — Ambulatory Visit (HOSPITAL_COMMUNITY)
Admission: RE | Admit: 2021-04-30 | Discharge: 2021-04-30 | Disposition: A | Discharge status: Home / Self Care | Payer: Medicare HMO | Source: Ambulatory Visit | Attending: Family Medicine | Admitting: Family Medicine

## 2021-04-30 ENCOUNTER — Other Ambulatory Visit: Payer: Self-pay

## 2021-04-30 DIAGNOSIS — Z1231 Encounter for screening mammogram for malignant neoplasm of breast: Secondary | ICD-10-CM | POA: Diagnosis not present

## 2021-05-13 ENCOUNTER — Telehealth: Payer: Self-pay | Admitting: Family Medicine

## 2021-05-13 NOTE — Telephone Encounter (Signed)
Left message for patient to call back and schedule Medicare Annual Wellness Visit (AWV) in office.   If unable to come into the office for AWV,  please offer to do virtually or by telephone.  Last AWV: 03/06/2020  Please schedule at anytime with [facility]-Nurse Health Advisor.  40 minute appointment  Any questions, please contact me at [phone]

## 2021-05-25 ENCOUNTER — Other Ambulatory Visit: Payer: Self-pay

## 2021-05-25 MED ORDER — LOSARTAN POTASSIUM 50 MG PO TABS: 50.0000 mg | ORAL_TABLET | Freq: Every day | ORAL | 3 refills | Status: DC

## 2021-05-25 NOTE — Telephone Encounter (Signed)
Refilled losartan 50 mg qd #90 with RF:2 to mail order

## 2021-05-28 ENCOUNTER — Other Ambulatory Visit: Payer: Self-pay | Admitting: Student

## 2021-05-29 ENCOUNTER — Ambulatory Visit: Payer: Medicare HMO | Admitting: Podiatry

## 2021-06-02 ENCOUNTER — Other Ambulatory Visit: Payer: Self-pay

## 2021-06-02 ENCOUNTER — Ambulatory Visit (INDEPENDENT_AMBULATORY_CARE_PROVIDER_SITE_OTHER): Payer: Medicare HMO

## 2021-06-02 VITALS — BP 132/74 | HR 41 | Temp 98.4°F | Ht 64.0 in | Wt 240.6 lb

## 2021-06-02 DIAGNOSIS — K589 Irritable bowel syndrome without diarrhea: Secondary | ICD-10-CM | POA: Diagnosis not present

## 2021-06-02 DIAGNOSIS — Z0000 Encounter for general adult medical examination without abnormal findings: Secondary | ICD-10-CM

## 2021-06-02 NOTE — Progress Notes (Signed)
Subjective:   [name] is a 75 y.o. female who presents for Medicare Annual (Subsequent) preventive examination.  Review of Systems     Cardiac Risk Factors include: advanced age (>75men, >106 women);diabetes mellitus;dyslipidemia;hypertension;obesity (BMI >30kg/m2);sedentary lifestyle     Objective:    Today's Vitals   06/02/21 0825  BP: 132/74  Pulse: (!) 41  Temp: 98.4 F (36.9 C)  SpO2: 100%  Weight: 240 lb 9.6 oz (109.1 kg)  Height: 5\' 4"  (1.626 m)   Body mass index is 41.3 kg/m.  Advanced Directives 06/02/2021 12/29/2020 09/03/2020 02/15/2020 07/30/2016 07/18/2016 06/23/2016  Does Patient Have a Medical Advance Directive? No No No No No No No  Would patient like information on creating a medical advance directive? No - Patient declined No - Patient declined No - Patient declined No - Patient declined - - -  Pre-existing out of facility DNR order (yellow form or pink MOST form) - - - - - - -    Current Medications (verified) Outpatient Encounter Medications as of 06/02/2021  Medication Sig   acetaminophen (TYLENOL) 500 MG tablet Take 1,000 mg by mouth every 6 (six) hours as needed for mild pain, moderate pain or headache.    clopidogrel (PLAVIX) 75 MG tablet TAKE 1 TABLET (75 MG TOTAL) BY MOUTH DAILY.   diclofenac Sodium (VOLTAREN) 1 % GEL Apply 2 g topically 4 (four) times daily.   glucose blood (PRODIGY NO CODING BLOOD GLUC) test strip USE AS DIRECTED ONE TIME DAILY   linaclotide (LINZESS) 145 MCG CAPS capsule Take 1 capsule (145 mcg total) by mouth daily before breakfast.   losartan (COZAAR) 50 MG tablet Take 1 tablet (50 mg total) by mouth daily.   Multiple Vitamins-Minerals (MULTIVITAMIN WITH MINERALS) tablet Take 1 tablet by mouth daily.   pantoprazole (PROTONIX) 40 MG tablet TAKE 1 TABLET EVERY DAY   pravastatin (PRAVACHOL) 20 MG tablet TAKE 1 TABLET AT BEDTIME   No facility-administered encounter medications on file as of 06/02/2021.    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     Fibrocystic breast disease    Hepatic cyst    Hypertension    IBS (irritable bowel syndrome)    Impaired glucose tolerance    Menopause    Past Surgical History:  Procedure Laterality Date   ABDOMINAL HYSTERECTOMY     BACK SURGERY     BREAST SURGERY  right   cyst removed   CAST APPLICATION  2/53/6644   Procedure: CAST APPLICATION;  Surgeon: Carole Civil, MD;  Location: [facility];  Service: Orthopedics;  Laterality: Right;  procedure room    CHOLECYSTECTOMY     COLONOSCOPY  12/04/2004   NUR:Few small diverticula at sigmoid colon/Small cecal polyp ablated    COLONOSCOPY N/A 09/24/2014   SLF: small internal hemorrhoids   COLONOSCOPY N/A 02/15/2020   Procedure: COLONOSCOPY;  Surgeon: [name], MD;  Location: [facility];  Service: Endoscopy;  Laterality: N/A;  8:30am   ESOPHAGOGASTRODUODENOSCOPY N/A 02/02/2016   Procedure: ESOPHAGOGASTRODUODENOSCOPY (EGD);  Surgeon: Danie Binder, MD;  Location: [facility];  Service: Endoscopy;  Laterality: N/A;  930    KNEE ARTHROCENTESIS  left   POLYPECTOMY  02/15/2020   Procedure: POLYPECTOMY;  Surgeon: [name], MD;  Location: [facility];  Service: Endoscopy;;   TUBAL LIGATION     Family History  Problem Relation Age of Onset   Stroke Mother    Heart attack Mother    Cancer Sister    Diabetes Sister    Seizures Brother    Diabetes Brother    Heart disease Brother    Kidney disease Son    Colon cancer Neg Hx    Social History   Socioeconomic History   Marital status: Widowed    Spouse name: Not on file   Number of  children: 2   Years of education: Not on file   Highest education level: Not on file  Occupational History   Occupation: Retired  Tobacco Use   Smoking status: Never   Smokeless tobacco: Never   Tobacco comments:    Never smoked  Vaping Use   Vaping Use: Never used  Substance and Sexual Activity   Alcohol use: No    Alcohol/week: 0.0 standard drinks   Drug use: No   Sexual activity: Yes  Other Topics Concern   Not on file  Social History Narrative   1 son and 1 daughter live close by.   Social Determinants of Health   Financial Resource Strain: Low Risk    Difficulty of Paying Living Expenses: Not hard at all  Food Insecurity: No Food Insecurity   Worried About Charity fundraiser in the Last Year: Never true   [place] in the Last Year: Never true  Transportation Needs: No Transportation Needs   Lack of Transportation (Medical): No   Lack of Transportation (Non-Medical): No  Physical Activity: Insufficiently Active   Days of Exercise per Week: 2 days   Minutes of Exercise per Session: 30 min  Stress: No Stress Concern Present   Feeling of Stress : Not at all  Social Connections: Moderately Integrated   Frequency of Communication with Friends and Family: More than three times a week   Frequency of Social Gatherings with Friends and Family: More than three times a week   Attends Religious Services: More than 4 times per year   Active Member of [employer] or Organizations: Yes   Attends Archivist Meetings: More than 4 times per year   Marital Status: Widowed    Tobacco Counseling Counseling given: Not Answered Tobacco comments: Never smoked   Clinical Intake:  Pre-visit preparation completed: Yes  Pain : No/denies pain     BMI - recorded: 41.3 Nutritional Status: BMI > 30  Obese Nutritional Risks: None Diabetes: Yes  How often do you need to have someone help you when you read instructions, pamphlets, or other written materials from your  doctor or pharmacy?: 1 - Never  Diabetic?Nutrition Risk Assessment:  Has the patient had any N/V/D within the last 2 months?  Yes  Does the patient have any non-healing wounds?  No  Has the patient had any unintentional weight loss or weight gain?  Yes   Diabetes:  Is the patient diabetic?  Yes  If diabetic, was a CBG obtained today?  No  Did the patient bring in their glucometer from home?  No  How often do you monitor your CBG's? 2 times per week.   Financial Strains and Diabetes Management:  Are you having any financial strains with the device,  your supplies or your medication? No .  Does the patient want to be seen by Chronic Care Management for management of their diabetes?  No  Would the patient like to be referred to a Nutritionist or for Diabetic Management?  No   Diabetic Exams:  Diabetic Eye Exam: Completed 02/2021. Pt has been advised about the importance in completing this exam.  Diabetic Foot Exam: Completed 04/27/2021. Pt has been advised about the importance in completing this exam.    Interpreter Needed?: No  Information entered by :: MJ Brach Birdsall, LPN   Activities of Daily Living In your present state of health, do you have any difficulty performing the following activities: 06/02/2021  Hearing? N  Vision? N  Difficulty concentrating or making decisions? N  Walking or climbing stairs? Y  Dressing or bathing? N  Doing errands, shopping? N  Preparing Food and eating ? N  Using the Toilet? N  In the past six months, have you accidently leaked urine? Y  Comment Occasional  Do you have problems with loss of bowel control? N  Managing your Medications? N  Managing your Finances? N  Housekeeping or managing your Housekeeping? N  Some recent data might be hidden    Patient Care Team: [name], MD as PCP - General (Family Medicine) Branch, Alphonse Guild, MD as PCP - Cardiology (Cardiology) Eloise Harman, DO as Consulting Physician (Internal  Medicine)  Indicate any recent Medical Services you may have received from other than [facility] providers in the past year (date may be approximate).     Assessment:   This is a routine wellness examination for Ashlee.  Hearing/Vision screen Hearing Screening - Comments:: No hearing issues. Vision Screening - Comments:: Glasses. Dr. Mindi Junker in [place]. July 2022.  Dietary issues and exercise activities discussed: Current Exercise Habits: Structured exercise class, Type of exercise: Other - see comments (water aerobics at Y), Time (Minutes): 30, Frequency (Times/Week): 2, Weekly Exercise (Minutes/Week): 60, Intensity: Mild, Exercise limited by: cardiac condition(s);orthopedic condition(s)   Goals Addressed             This Visit's Progress    Exercise 3x per week (30 min per time)         Depression Screen PHQ 2/9 Scores 06/02/2021 04/27/2021 01/22/2021 12/23/2020 08/15/2020 06/18/2020 03/06/2020  PHQ - 2 Score 0 1 0 0 0 0 0  PHQ- 9 Score - - - - - - -    Fall Risk Fall Risk  06/02/2021 04/27/2021 01/22/2021 12/23/2020 09/22/2020  Falls in the past year? 0 0 0 0 0  Number falls in past yr: 0 0 0 0 -  Injury with Fall? 0 0 0 0 -  Risk for fall due to : Impaired balance/gait;Impaired mobility No Fall Risks No Fall Risks No Fall Risks -  Follow up Falls prevention discussed Falls evaluation completed Falls evaluation completed Falls evaluation completed Falls evaluation completed    [place]:  Any stairs in or around the home? Yes  If so, are there any without handrails? No  Home free of loose throw rugs in walkways, pet beds, electrical cords, etc? Yes  Adequate lighting in your home to reduce risk of falls? Yes   ASSISTIVE DEVICES UTILIZED TO PREVENT FALLS:  Life alert? No  Use of a cane, walker or w/c? Yes  Grab bars in the bathroom? Yes  Shower chair or bench in shower? No  Elevated toilet seat or a handicapped toilet? Yes  TIMED UP AND  GO:  Was the test performed? Yes .  Length of time to ambulate 10 feet: 7 sec.   Gait steady and fast without use of assistive device  Cognitive Function: Normal cognitive status assessed by direct observation by this Nurse Health Advisor. No abnormalities found.          Immunizations Immunization History  Administered Date(s) Administered   DT (Pediatric) 11/13/2013   Fluad Quad(high Dose 65+) 07/04/2020   Influenza Split 06/27/2013   Influenza,inj,Quad PF,6+ Mos 06/18/2014, 06/13/2015, 05/27/2016, 06/24/2017, 06/06/2018, 05/14/2019   Influenza-Unspecified 06/02/2021   Moderna Sars-Covid-2 Vaccination 11/09/2019, 12/06/2019   Pneumococcal Conjugate-13 07/03/2014   Pneumococcal Polysaccharide-23 06/27/2013   Tdap 11/22/2019   Zoster Recombinat (Shingrix) 02/10/2018, 08/17/2018    TDAP status: Up to date  Flu Vaccine status: Completed at today's visit  Pneumococcal vaccine status: Up to date  Covid-19 vaccine status: Information provided on how to obtain vaccines.   Qualifies for Shingles Vaccine? Yes   Zostavax completed Yes   Shingrix Completed?: Yes  Screening Tests Health Maintenance  Topic Date Due   OPHTHALMOLOGY EXAM  02/28/2019   HEMOGLOBIN A1C  10/27/2021   FOOT EXAM  04/27/2022   MAMMOGRAM  05/01/2023   COLONOSCOPY (Pts 45-90yrs Insurance coverage will need to be confirmed)  02/14/2025   TETANUS/TDAP  11/21/2029   INFLUENZA VACCINE  Completed   DEXA SCAN  Completed   Hepatitis C Screening  Completed   Zoster Vaccines- Shingrix  Completed   HPV VACCINES  Aged Out   COVID-19 Vaccine  Discontinued    Health Maintenance  Health Maintenance Due  Topic Date Due   OPHTHALMOLOGY EXAM  02/28/2019    Colorectal cancer screening: Type of screening: Colonoscopy. Completed 02/15/2020. Repeat every 5 years  Mammogram status: Completed 04/30/21. Repeat every year  Bone Density status: Completed 10/23/2018. Results reflect: Bone density results: NORMAL.  Repeat every 2 years.  Lung Cancer Screening: (Low Dose CT Chest recommended if Age 31-80 years, 30 pack-year currently smoking OR have quit w/in 15years.) does not qualify.     Additional Screening:  Hepatitis C Screening: does qualify; Completed 01/02/2018  Vision Screening: Recommended annual ophthalmology exams for early detection of glaucoma and other disorders of the eye. Is the patient up to date with their annual eye exam?  Yes  Who is the provider or what is the name of the office in which the patient attends annual eye exams? Dr. Mindi Junker in [place] If pt is not established with a provider, would they like to be referred to a provider to establish care? No .   Dental Screening: Recommended annual dental exams for proper oral hygiene  Community Resource Referral / Chronic Care Management: CRR required this visit?  No   CCM required this visit?  Yes      Plan:     I have personally reviewed and noted the following in the patient's chart:   Medical and social history Use of alcohol, tobacco or illicit drugs  Current medications and supplements including opioid prescriptions.  Functional ability and status Nutritional status Physical activity Advanced directives List of other physicians Hospitalizations, surgeries, and ER visits in previous 12 months Vitals Screenings to include cognitive, depression, and falls Referrals and appointments  In addition, I have reviewed and discussed with patient certain preventive protocols, quality metrics, and best practice recommendations. A written personalized care plan for preventive services as well as general preventive health recommendations were provided to patient.     Stanton Kidney  Valentina Lucks, LPN   38/08/[id number]   Nurse Notes: Doing well. Discussed in length diet/exercise. Pt currently seeing nutritionist. Pt would like referral to help with cost of Linzess. CCM referral made.

## 2021-06-02 NOTE — Patient Instructions (Signed)
[name] , Thank you for taking time to come for your Medicare Wellness Visit. I appreciate your ongoing commitment to your health goals. Please review the following plan we discussed and let me know if I can assist you in the future.   Screening recommendations/referrals: Colonoscopy: Done 02/15/2020 Repeat in 5 years  Mammogram: Done 04/30/2021 Repeat annually  Bone Density: Done 10/23/2018 Repeat every 2 years  Recommended yearly ophthalmology/optometry visit for glaucoma screening and checkup Recommended yearly dental visit for hygiene and checkup  Vaccinations: Influenza vaccine: Done 06/02/2021 Repeat annually  Pneumococcal vaccine: Done 06/27/2013 and 07/03/2014 Tdap vaccine: Done 11/22/2019 Shingles vaccine: Shingrix discussed. Please contact your pharmacy for coverage information.     Covid-19: Done 12/06/2019, 11/09/2019  Advanced directives: Advance directive discussed with you today. Even though you declined this today, please call our office should you change your mind, and we can give you the proper paperwork for you to fill out.   Conditions/risks identified: Aim for 30 minutes of exercise or walking each day, drink 6-8 glasses of water and eat lots of fruits and vegetables.   Next appointment: Follow up in one year for your annual wellness visit 2023   Preventive Care 65 Years and Older, Female Preventive care refers to lifestyle choices and visits with your health care provider that can promote health and wellness. What does preventive care include? A yearly physical exam. This is also called an annual well check. Dental exams once or twice a year. Routine eye exams. Ask your health care provider how often you should have your eyes checked. Personal lifestyle choices, including: Daily care of your teeth and gums. Regular physical activity. Eating a healthy diet. Avoiding tobacco and drug use. Limiting alcohol use. Practicing safe sex. Taking low-dose aspirin  every day. Taking vitamin and mineral supplements as recommended by your health care provider. What happens during an annual well check? The services and screenings done by your health care provider during your annual well check will depend on your age, overall health, lifestyle risk factors, and family history of disease. Counseling  Your health care provider may ask you questions about your: Alcohol use. Tobacco use. Drug use. Emotional well-being. Home and relationship well-being. Sexual activity. Eating habits. History of falls. Memory and ability to understand (cognition). Work and work Statistician. Reproductive health. Screening  You may have the following tests or measurements: Height, weight, and BMI. Blood pressure. Lipid and cholesterol levels. These may be checked every 5 years, or more frequently if you are over 57 years old. Skin check. Lung cancer screening. You may have this screening every year starting at age 46 if you have a 30-pack-year history of smoking and currently smoke or have quit within the past 15 years. Fecal occult blood test (FOBT) of the stool. You may have this test every year starting at age 25. Flexible sigmoidoscopy or colonoscopy. You may have a sigmoidoscopy every 5 years or a colonoscopy every 10 years starting at age 20. Hepatitis C blood test. Hepatitis B blood test. Sexually transmitted disease (STD) testing. Diabetes screening. This is done by checking your blood sugar (glucose) after you have not eaten for a while (fasting). You may have this done every 1-3 years. Bone density scan. This is done to screen for osteoporosis. You may have this done starting at age 80. Mammogram. This may be done every 1-2 years. Talk to your health care provider about how often you should have regular mammograms. Talk with your health care provider about  your test results, treatment options, and if necessary, the need for more tests. Vaccines  Your health  care provider may recommend certain vaccines, such as: Influenza vaccine. This is recommended every year. Tetanus, diphtheria, and acellular pertussis (Tdap, Td) vaccine. You may need a Td booster every 10 years. Zoster vaccine. You may need this after age 27. Pneumococcal 13-valent conjugate (PCV13) vaccine. One dose is recommended after age 71. Pneumococcal polysaccharide (PPSV23) vaccine. One dose is recommended after age 81. Talk to your health care provider about which screenings and vaccines you need and how often you need them. This information is not intended to replace advice given to you by your health care provider. Make sure you discuss any questions you have with your health care provider. Document Released: 09/12/2015 Document Revised: 05/05/2016 Document Reviewed: 06/17/2015 Elsevier Interactive Patient Education  2017 [place] Prevention in the Home Falls can cause injuries. They can happen to people of all ages. There are many things you can do to make your home safe and to help prevent falls. What can I do on the outside of my home? Regularly fix the edges of walkways and driveways and fix any cracks. Remove anything that might make you trip as you walk through a door, such as a raised step or threshold. Trim any bushes or trees on the path to your home. Use bright outdoor lighting. Clear any walking paths of anything that might make someone trip, such as rocks or tools. Regularly check to see if handrails are loose or broken. Make sure that both sides of any steps have handrails. Any raised decks and porches should have guardrails on the edges. Have any leaves, snow, or ice cleared regularly. Use sand or salt on walking paths during winter. Clean up any spills in your garage right away. This includes oil or grease spills. What can I do in the bathroom? Use night lights. Install grab bars by the toilet and in the tub and shower. Do not use towel bars as grab  bars. Use non-skid mats or decals in the tub or shower. If you need to sit down in the shower, use a plastic, non-slip stool. Keep the floor dry. Clean up any water that spills on the floor as soon as it happens. Remove soap buildup in the tub or shower regularly. Attach bath mats securely with double-sided non-slip rug tape. Do not have throw rugs and other things on the floor that can make you trip. What can I do in the bedroom? Use night lights. Make sure that you have a light by your bed that is easy to reach. Do not use any sheets or blankets that are too big for your bed. They should not hang down onto the floor. Have a firm chair that has side arms. You can use this for support while you get dressed. Do not have throw rugs and other things on the floor that can make you trip. What can I do in the kitchen? Clean up any spills right away. Avoid walking on wet floors. Keep items that you use a lot in easy-to-reach places. If you need to reach something above you, use a strong step stool that has a grab bar. Keep electrical cords out of the way. Do not use floor polish or wax that makes floors slippery. If you must use wax, use non-skid floor wax. Do not have throw rugs and other things on the floor that can make you trip. What can I do with  my stairs? Do not leave any items on the stairs. Make sure that there are handrails on both sides of the stairs and use them. Fix handrails that are broken or loose. Make sure that handrails are as long as the stairways. Check any carpeting to make sure that it is firmly attached to the stairs. Fix any carpet that is loose or worn. Avoid having throw rugs at the top or bottom of the stairs. If you do have throw rugs, attach them to the floor with carpet tape. Make sure that you have a light switch at the top of the stairs and the bottom of the stairs. If you do not have them, ask someone to add them for you. What else can I do to help prevent  falls? Wear shoes that: Do not have high heels. Have rubber bottoms. Are comfortable and fit you well. Are closed at the toe. Do not wear sandals. If you use a stepladder: Make sure that it is fully opened. Do not climb a closed stepladder. Make sure that both sides of the stepladder are locked into place. Ask someone to hold it for you, if possible. Clearly mark and make sure that you can see: Any grab bars or handrails. First and last steps. Where the edge of each step is. Use tools that help you move around (mobility aids) if they are needed. These include: Canes. Walkers. Scooters. Crutches. Turn on the lights when you go into a dark area. Replace any light bulbs as soon as they burn out. Set up your furniture so you have a clear path. Avoid moving your furniture around. If any of your floors are uneven, fix them. If there are any pets around you, be aware of where they are. Review your medicines with your doctor. Some medicines can make you feel dizzy. This can increase your chance of falling. Ask your doctor what other things that you can do to help prevent falls. This information is not intended to replace advice given to you by your health care provider. Make sure you discuss any questions you have with your health care provider. Document Released: 06/12/2009 Document Revised: 01/22/2016 Document Reviewed: 09/20/2014 Elsevier Interactive Patient Education  2017 [employer].

## 2021-06-10 ENCOUNTER — Other Ambulatory Visit: Payer: Self-pay | Admitting: Family Medicine

## 2021-06-10 ENCOUNTER — Telehealth: Payer: Self-pay | Admitting: *Deleted

## 2021-06-10 NOTE — Chronic Care Management (AMB) (Signed)
  Chronic Care Management   Note  06/10/2021 Name: [name] MRN: [id number] DOB: January 31, 1946  [name] is a 75 y.o. year old female who is a primary care patient of Luking, Elayne Snare, MD. I reached out to [name] by phone today in response to a referral sent by Ms. Dublin PCP.  [name] was given information about Chronic Care Management services today including:  CCM service includes personalized support from designated clinical staff supervised by her physician, including individualized plan of care and coordination with other care providers 24/7 contact phone numbers for assistance for urgent and routine care needs. Service will only be billed when office clinical staff spend 20 minutes or more in a month to coordinate care. Only one practitioner may furnish and bill the service in a calendar month. The patient may stop CCM services at any time (effective at the end of the month) by phone call to the office staff. The patient is responsible for co-pay (up to 20% after annual deductible is met) if co-pay is required by the individual health plan.   Patient agreed to services and verbal consent obtained.   Follow up plan: Telephone appointment with care management team member scheduled for:06/26/21  [facility]  Direct Dial: [phone]

## 2021-06-23 ENCOUNTER — Ambulatory Visit: Payer: Medicare HMO | Admitting: Podiatry

## 2021-06-23 ENCOUNTER — Other Ambulatory Visit: Payer: Self-pay

## 2021-06-23 DIAGNOSIS — B351 Tinea unguium: Secondary | ICD-10-CM

## 2021-06-23 DIAGNOSIS — E1142 Type 2 diabetes mellitus with diabetic polyneuropathy: Secondary | ICD-10-CM | POA: Diagnosis not present

## 2021-06-23 DIAGNOSIS — E1169 Type 2 diabetes mellitus with other specified complication: Secondary | ICD-10-CM | POA: Diagnosis not present

## 2021-06-23 NOTE — Progress Notes (Signed)
  Subjective:  Patient ID: [name], female    DOB: [birth year],  MRN: [id number]  No chief complaint on file.  DOS: 10/01/20 Procedure: Left ankle ATFL repair/augmentation  75 y.o. female presents with the above complaint. History confirmed with patient.  Objective:  Physical Exam: No  peri-incisional tenderness. Good ankle strength , good range of motion of the ankle no pain palpation about posterior Achilles. Absent protective sensation. Nails thickened and dystorphic. Incision: healed.  Assessment:   1. Onychomycosis of multiple toenails with type 2 diabetes mellitus and peripheral neuropathy ([place])     Plan:   Left ankle insufficiency, with Achilles tendonitis -Doing very well denies pain or issues, well healed.  Onychomycosis, DM, DPN -Nails debrided x10  Procedure: Nail Debridement Type of Debridement: manual, sharp debridement. Instrumentation: Nail nipper, rotary burr. Number of Nails: 10  Return in about 3 months (around 09/23/2021) for Diabetic Foot Care.

## 2021-06-26 ENCOUNTER — Ambulatory Visit (INDEPENDENT_AMBULATORY_CARE_PROVIDER_SITE_OTHER): Payer: Medicare HMO | Admitting: Pharmacist

## 2021-06-26 DIAGNOSIS — E1169 Type 2 diabetes mellitus with other specified complication: Secondary | ICD-10-CM

## 2021-06-26 DIAGNOSIS — E1140 Type 2 diabetes mellitus with diabetic neuropathy, unspecified: Secondary | ICD-10-CM

## 2021-06-26 DIAGNOSIS — I10 Essential (primary) hypertension: Secondary | ICD-10-CM

## 2021-06-26 NOTE — Chronic Care Management (AMB) (Signed)
Chronic Care Management Pharmacy Note  06/26/2021 Name:  [name] MRN:  [id number] DOB:  10/06/1945  Summary:  Hyperlipidemia Consider increasing dose of pravastatin to lower LDL below goal  Medication Cost: Patient reports cost concerns with [employer]. Copay is $45 per month per patient. Medication assistance evaluation complete. Based on household size and income, patient should qualify for Medicare Extra Help. Patient reports that she has applied and has been approved for this. However, if she were approved, Linzess copay would be $9.85. Patient instructed to call local SSA office to see if she has in fact been approved for Medicare Extra Help and if not to help facilitate this.  Subjective: [name] is an 75 y.o. year old female who is a primary patient of Luking, Elayne Snare, MD.  The CCM team was consulted for assistance with disease management and care coordination needs.    Engaged with patient by telephone for initial visit in response to provider referral for pharmacy case management and/or care coordination services.   Consent to Services:  The patient was given the following information about Chronic Care Management services today, agreed to services, and gave verbal consent: 1. CCM service includes personalized support from designated clinical staff supervised by the primary care provider, including individualized plan of care and coordination with other care providers 2. 24/7 contact phone numbers for assistance for urgent and routine care needs. 3. Service will only be billed when office clinical staff spend 20 minutes or more in a month to coordinate care. 4. Only one practitioner may furnish and bill the service in a calendar month. 5.The patient may stop CCM services at any time (effective at the end of the month) by phone call to the office staff. 6. The patient will be responsible for cost sharing (co-pay) of up to 20% of the service fee (after annual  deductible is met). Patient agreed to services and consent obtained.  Patient Care Team: [name], MD as PCP - General (Family Medicine) Harl Bowie Alphonse Guild, MD as PCP - Cardiology (Cardiology) Eloise Harman, DO as Consulting Physician (Internal Medicine) Beryle Lathe, [facility] (Pharmacist)  Objective:  Lab Results  Component Value Date   CREATININE 0.95 04/27/2021   CREATININE 0.82 03/13/2021   CREATININE 0.79 01/13/2021    Lab Results  Component Value Date   HGBA1C 6.8 (H) 04/27/2021   Last diabetic Eye exam:  Lab Results  Component Value Date/Time   HMDIABEYEEXA No Retinopathy 01/29/2016 12:00 AM    Last diabetic Foot exam: No results found for: HMDIABFOOTEX      Component Value Date/Time   CHOL 156 04/27/2021 1056   TRIG 81 04/27/2021 1056   HDL 56 04/27/2021 1056   CHOLHDL 2.8 04/27/2021 1056   CHOLHDL 3.4 09/26/2014 1019   VLDL 17 09/26/2014 1019   LDLCALC 85 04/27/2021 1056    Hepatic Function Latest Ref Rng & Units 03/13/2021 01/13/2021 11/22/2019  Total Protein 6.5 - 8.1 g/dL 7.0 6.9 6.9  Albumin 3.5 - 5.0 g/dL 3.9 4.1 4.3  AST 15 - 41 U/L _0 ALT 0 - 44 U/L _1 Alk Phosphatase 38 - 126 U/L 75 78 80  Total Bilirubin 0.3 - 1.2 mg/dL 0.5 0.3 0.3  Bilirubin, Direct 0.00 - 0.40 mg/dL - - 0.12    Lab Results  Component Value Date/Time   TSH 1.838 03/13/2021 01:35 PM   TSH 1.280 03/15/2019 09:41 AM   TSH 2.380 10/04/2017 09:50  AM    CBC Latest Ref Rng & Units 03/13/2021 11/22/2019 03/15/2019  WBC 4.0 - 10.5 K/uL 7.1 5.9 6.0  Hemoglobin 12.0 - 15.0 g/dL 12.9 13.0 13.1  Hematocrit 36.0 - 46.0 % 41.4 40.8 41.6  Platelets 150 - 400 K/uL 298 323 308    No results found for: VD25OH  Clinical ASCVD: No  The 10-year ASCVD risk score (Arnett DK, et al., 2019) is: 26%   Values used to calculate the score:     Age: 7 years     Sex: Female     Is Non-Hispanic African American: Yes     Diabetic: Yes     Tobacco smoker: No      Systolic Blood Pressure: 989 mmHg     Is BP treated: Yes     HDL Cholesterol: 56 mg/dL     Total Cholesterol: 156 mg/dL     Social History   Tobacco Use  Smoking Status Never  Smokeless Tobacco Never  Tobacco Comments   Never smoked   BP Readings from Last 3 Encounters:  06/02/21 132/74  04/27/21 132/76  04/21/21 (!) 150/60   Pulse Readings from Last 3 Encounters:  06/02/21 (!) 41  04/21/21 83  03/24/21 (!) 45   Wt Readings from Last 3 Encounters:  06/02/21 240 lb 9.6 oz (109.1 kg)  04/27/21 237 lb 12.8 oz (107.9 kg)  04/21/21 238 lb 3.2 oz (108 kg)    Assessment: Review of patient past medical history, allergies, medications, health status, including review of consultants reports, laboratory and other test data, was performed as part of comprehensive evaluation and provision of chronic care management services.   SDOH:  (Social Determinants of Health) assessments and interventions performed:    CCM Care Plan  Allergies  Allergen Reactions   Ibuprofen     Do not take since she is on antiplatelet   Norvasc [Amlodipine Besylate] Other (See Comments)    Reaction:  Fatigue    Penicillins Hives and Other (See Comments)    Has patient had a PCN reaction causing immediate rash, facial/tongue/throat swelling, SOB or lightheadedness with hypotension: Yes Has patient had a PCN reaction causing severe rash involving mucus membranes or skin necrosis: No Has patient had a PCN reaction that required hospitalization No Has patient had a PCN reaction occurring within the last 10 years: No If all of the above answers are "NO", then may proceed with Cephalosporin use.    Advair Diskus [Fluticasone-Salmeterol] Palpitations   Metformin And Related Other (See Comments)    Reaction:  GI upset     Medications Reviewed Today     Reviewed by Beryle Lathe, [facility] (Pharmacist) on 06/26/21 at 1042  Med List Status: <None>   Medication Order Taking? Sig Documenting Provider Last  Dose Status Informant  acetaminophen (TYLENOL) 500 MG tablet [id number] Yes Take 1,000 mg by mouth every 6 (six) hours as needed for mild pain, moderate pain or headache.  [provider] Taking Active Self  clopidogrel (PLAVIX) 75 MG tablet [id number] Yes TAKE 1 TABLET (75 MG TOTAL) BY MOUTH DAILY. Arnoldo Lenis, MD Taking Active   diclofenac Sodium (VOLTAREN) 1 % GEL [id number] Yes Apply 2 g topically 4 (four) times daily. Evelina Bucy, DPM Taking Active   glucose blood (PRODIGY NO CODING BLOOD GLUC) test strip [id number]  USE AS DIRECTED ONE TIME DAILY [name], MD  Active   linaclotide [facility]) 145 MCG CAPS capsule [id number] Yes Take 1 capsule (145 mcg total)  by mouth daily before breakfast. Mahala Menghini, PA-C Taking Active   losartan (COZAAR) 50 MG tablet [id number] Yes Take 1 tablet (50 mg total) by mouth daily. Arnoldo Lenis, MD Taking Active   Multiple Vitamins-Minerals (MULTIVITAMIN WITH MINERALS) tablet [id number] Yes Take 1 tablet by mouth daily. [provider] Taking Active Self  pantoprazole (PROTONIX) 40 MG tablet [id number] Yes TAKE 1 TABLET EVERY DAY Luking, Scott A, MD Taking Active   pravastatin (PRAVACHOL) 20 MG tablet [id number] Yes TAKE 1 TABLET AT BEDTIME [name], MD Taking Active             Patient Active Problem List   Diagnosis Date Noted   Nausea without vomiting 04/17/2020   History of adenomatous polyp of colon 01/16/2020   Fibrocystic breast disease (FCBD) 05/14/2019   Abdominal pain 01/15/2019   Diarrhea 11/09/2018   Depression, major, single episode, moderate ([place]) 10/05/2018   Hyperlipidemia associated with type 2 diabetes mellitus ([place]) 06/06/2018   Morbid obesity ([place]) 01/18/2018   Encounter for dental exam and cleaning w/o abnormal findings 01/03/2018   GERD (gastroesophageal reflux disease) 09/05/2017   Diverticulitis 05/04/2016   IBS (irritable bowel syndrome) 12/29/2015   Constipation 02/03/2015    Dyspepsia    Loss of weight    Decreased appetite 09/16/2014   Unintentional weight loss 09/16/2014   Diabetes type 2, uncontrolled 06/27/2013   DVT of axillary vein, acute right ([place]) 11/24/2012   Type 2 diabetes mellitus with diabetic neuropathy, without long-term current use of insulin ([place]) 11/15/2012   Chest pain, unspecified 05/10/2011   Bradycardia 05/10/2011   HYPERCHOLESTEROLEMIA 08/29/2008   DEPRESSION 08/29/2008   Essential hypertension 08/29/2008   PEPTIC ULCER DISEASE 08/29/2008   HIATAL HERNIA 08/29/2008   HEPATIC CYST 08/29/2008   Osteoarthritis 08/29/2008    Immunization History  Administered Date(s) Administered   DT (Pediatric) 11/13/2013   Fluad Quad(high Dose 65+) 07/04/2020   Influenza Split 06/27/2013   Influenza,inj,Quad PF,6+ Mos 06/18/2014, 06/13/2015, 05/27/2016, 06/24/2017, 06/06/2018, 05/14/2019   Influenza-Unspecified 06/02/2021   Moderna Sars-Covid-2 Vaccination 11/09/2019, 12/06/2019   Pneumococcal Conjugate-13 07/03/2014   Pneumococcal Polysaccharide-23 06/27/2013   Tdap 11/22/2019   Zoster Recombinat (Shingrix) 02/10/2018, 08/17/2018   Conditions to be addressed/monitored: HTN, HLD, and DMII  Care Plan : Medication Management  Updates made by Beryle Lathe, [name] since 06/26/2021 12:00 AM     Problem: HTN, T2DM, HLD   Priority: High  Onset Date: 06/26/2021     Goal: Disease Progression Prevention   Start Date: 06/26/2021  Expected End Date: 08/10/2021  This Visit's Progress: On track  Priority: High  Note:   Current Barriers:  Unable to independently afford treatment regimen Unable to achieve control of hyperlipidemia  Pharmacist Clinical Goal(s):  Patient will verbalize ability to afford treatment regimen achieve control of hyperlipidemia as evidenced by improved LDL through collaboration with PharmD and provider.   Interventions: 1:1 collaboration with [name], MD regarding development and update of  comprehensive plan of care as evidenced by provider attestation and co-signature Inter-disciplinary care team collaboration (see longitudinal plan of care) Comprehensive medication review performed; medication list updated in electronic medical record  Type 2 Diabetes - New goal.: Controlled; Most recent A1c at goal of <7% per ADA guidelines Current medications:  none Intolerances:  metformin (GI) On a statin: yes On aspirin 81 mg daily: no, on plavix Last microalbumin: unknown; on an ACEi/ARB: yes Continue to monitor A1c and blood glucose  Hypertension - New goal.: Blood pressure  under good control. Blood pressure is at goal of <130/80 mmHg per 2017 AHA/ACC guidelines. Current medications: losartan 50 mg by mouth once daily Intolerances:  amlodipine Taking medications as directed: yes Side effects thought to be attributed to current medication regimen: no Current home blood pressure: not discussed today Continue losartan 50 mg by mouth once daily Encourage dietary sodium restriction/DASH diet Recommend regular aerobic exercise Recommend home blood pressure monitoring to discuss at next visit Discussed need for medication compliance  Hyperlipidemia - New goal.: Uncontrolled. LDL above goal of <70 due to very high risk given 10-year risk >20% per 2020 AACE/ACE guidelines. Triglycerides at goal of <150 per 2020 AACE/ACE guidelines. Current medications: pravastatin 20 mg by mouth once daily Intolerances: none Taking medications as directed: yes Side effects thought to be attributed to current medication regimen: no Encourage dietary reduction of high fat containing foods such as butter, nuts, bacon, egg yolks, etc. Recommend regular aerobic exercise Discussed need for medication compliance Re-check lipid panel in 4-12 weeks Consider increasing dose of pravastatin to lower LDL below goal  Medication Cost: Patient reports cost concerns with [employer]. Copay is $45 per month per  patient. Medication assistance evaluation complete. Based on household size and income, patient should qualify for Medicare Extra Help. Patient reports that she has applied and has been approved for this. However, if she were approved, Linzess copay would be $9.85. Patient instructed to call local SSA office to see if she has in fact been approved for Medicare Extra Help and if not to help facilitate this.  Patient Goals/Self-Care Activities Patient will:  Take medications as prescribed Check blood pressure at least once daily, document, and provide at future appointments Collaborate with provider on medication access solutions  Follow Up Plan: Telephone follow up appointment with care management team member scheduled for: 07/17/21    Medication Assistance:  Patient instructed to make sure she has Medicare LIS  Patient's preferred pharmacy is:  [facility], [address] [place] [address] Phone: [phone] Fax: [phone]  [place], [name] - [address] [name] [address] Phone: [phone] Fax: [phone]  Follow Up:  Patient agrees to Care Plan and Follow-up.  Plan: Telephone follow up appointment with care management team member scheduled for:  07/17/21  Kennon Holter, PharmD Clinical Pharmacist [place] [phone]

## 2021-06-26 NOTE — Patient Instructions (Signed)
[name],  It was great to talk to you today!  Please call me with any questions or concerns.   Visit Information   PATIENT GOALS:   Goals Addressed             This Visit's Progress    Medication Management       Patient Goals/Self-Care Activities Patient will:  Take medications as prescribed Check blood pressure at least once daily, document, and provide at future appointments Collaborate with provider on medication access solutions        Consent to CCM Services: [name] was given information about Chronic Care Management services including:  CCM service includes personalized support from designated clinical staff supervised by her physician, including individualized plan of care and coordination with other care providers 24/7 contact phone numbers for assistance for urgent and routine care needs. Service will only be billed when office clinical staff spend 20 minutes or more in a month to coordinate care. Only one practitioner may furnish and bill the service in a calendar month. The patient may stop CCM services at any time (effective at the end of the month) by phone call to the office staff. The patient will be responsible for cost sharing (co-pay) of up to 20% of the service fee (after annual deductible is met).  Patient agreed to services and verbal consent obtained.   The patient verbalized understanding of instructions, educational materials, and care plan provided today and declined offer to receive copy of patient instructions, educational materials, and care plan.   Telephone follow up appointment with care management team member scheduled for:07/17/21  Kennon Holter, PharmD Clinical Pharmacist [place] [phone]   CLINICAL CARE PLAN: Patient Care Plan: Medication Management     Problem Identified: HTN, T2DM, HLD   Priority: High  Onset Date: 06/26/2021     Goal: Disease Progression Prevention    Start Date: 06/26/2021  Expected End Date: 08/10/2021  This Visit's Progress: On track  Priority: High  Note:   Current Barriers:  Unable to independently afford treatment regimen Unable to achieve control of hyperlipidemia  Pharmacist Clinical Goal(s):  Patient will verbalize ability to afford treatment regimen achieve control of hyperlipidemia as evidenced by improved LDL through collaboration with PharmD and provider.   Interventions: 1:1 collaboration with [name], MD regarding development and update of comprehensive plan of care as evidenced by provider attestation and co-signature Inter-disciplinary care team collaboration (see longitudinal plan of care) Comprehensive medication review performed; medication list updated in electronic medical record  Type 2 Diabetes - New goal.: Controlled; Most recent A1c at goal of <7% per ADA guidelines Current medications:  none Intolerances:  metformin (GI) On a statin: yes On aspirin 81 mg daily: no, on plavix Last microalbumin: unknown; on an ACEi/ARB: yes Continue to monitor A1c and blood glucose  Hypertension - New goal.: Blood pressure under good control. Blood pressure is at goal of <130/80 mmHg per 2017 AHA/ACC guidelines. Current medications: losartan 50 mg by mouth once daily Intolerances:  amlodipine Taking medications as directed: yes Side effects thought to be attributed to current medication regimen: no Current home blood pressure: not discussed today Continue losartan 50 mg by mouth once daily Encourage dietary sodium restriction/DASH diet Recommend regular aerobic exercise Recommend home blood pressure monitoring to discuss at next visit Discussed need for medication compliance  Hyperlipidemia - New goal.: Uncontrolled. LDL above goal of <70 due to very high risk given 10-year risk >20% per 2020 AACE/ACE guidelines.  Triglycerides at goal of <150 per 2020 AACE/ACE guidelines. Current medications: pravastatin  20 mg by mouth once daily Intolerances: none Taking medications as directed: yes Side effects thought to be attributed to current medication regimen: no Encourage dietary reduction of high fat containing foods such as butter, nuts, bacon, egg yolks, etc. Recommend regular aerobic exercise Discussed need for medication compliance Re-check lipid panel in 4-12 weeks Consider increasing dose of pravastatin to lower LDL below goal  Medication Cost: Patient reports cost concerns with [employer]. Copay is $45 per month per patient. Medication assistance evaluation complete. Based on household size and income, patient should qualify for Medicare Extra Help. Patient reports that she has applied and has been approved for this. However, if she were approved, Linzess copay would be $9.85. Patient instructed to call local SSA office to see if she has in fact been approved for Medicare Extra Help and if not to help facilitate this.  Patient Goals/Self-Care Activities Patient will:  Take medications as prescribed Check blood pressure at least once daily, document, and provide at future appointments Collaborate with provider on medication access solutions  Follow Up Plan: Telephone follow up appointment with care management team member scheduled for: 07/17/21    Based on our conversation today, please contact the local social security office to discuss applying for Medicare Extra Help (also known as low income subsidy).  Social Security Office [address] [place], [place] [address] [phone]

## 2021-06-29 DIAGNOSIS — I10 Essential (primary) hypertension: Secondary | ICD-10-CM

## 2021-06-29 DIAGNOSIS — E1169 Type 2 diabetes mellitus with other specified complication: Secondary | ICD-10-CM

## 2021-06-29 DIAGNOSIS — E1140 Type 2 diabetes mellitus with diabetic neuropathy, unspecified: Secondary | ICD-10-CM | POA: Diagnosis not present

## 2021-06-29 DIAGNOSIS — E785 Hyperlipidemia, unspecified: Secondary | ICD-10-CM

## 2021-07-17 ENCOUNTER — Ambulatory Visit (INDEPENDENT_AMBULATORY_CARE_PROVIDER_SITE_OTHER): Payer: Medicare HMO | Admitting: Pharmacist

## 2021-07-17 DIAGNOSIS — E1169 Type 2 diabetes mellitus with other specified complication: Secondary | ICD-10-CM

## 2021-07-17 DIAGNOSIS — E1140 Type 2 diabetes mellitus with diabetic neuropathy, unspecified: Secondary | ICD-10-CM

## 2021-07-17 DIAGNOSIS — I10 Essential (primary) hypertension: Secondary | ICD-10-CM

## 2021-07-17 NOTE — Chronic Care Management (AMB) (Signed)
Chronic Care Management Pharmacy Note  07/17/2021 Name:  ADALIAH HIEGEL MRN:  [id number] DOB:  01-18-1946  Summary:  Hyperlipidemia LDL above goal of <70 due to very high risk given 10-year risk >20% per 2020 AACE/ACE guidelines. Consider increasing dose of pravastatin to lower LDL below goal  Medication Cost: Patient reports cost concerns with Linzess. Copay is $45 per month per patient. Medication assistance evaluation complete. Based on household size and income, patient should qualify for Medicare Extra Help. Patient reports that she has applied and has been approved for this. However, if she were approved, Linzess copay would be $9.85. Patient called [place] office but was unable to get anyone to answer. Instructed patient to call [employer] to see if she has in fact been approved for Medicare Extra Help and if not to help facilitate this. Patient has samples from GI office in the meantime.   Subjective: MADALENA KESECKER is an 75 y.o. year old female who is a primary patient of Luking, Elayne Snare, MD.  The CCM team was consulted for assistance with disease management and care coordination needs.    Engaged with patient by telephone for follow up visit in response to provider referral for pharmacy case management and/or care coordination services.   Consent to Services:  The patient was given information about Chronic Care Management services, agreed to services, and gave verbal consent prior to initiation of services.  Please see initial visit note for detailed documentation.   Patient Care Team: [name], MD as PCP - General (Family Medicine) Harl Bowie Alphonse Guild, MD as PCP - Cardiology (Cardiology) Eloise Harman, DO as Consulting Physician (Internal Medicine) Beryle Lathe, [facility]  (Pharmacist)  Objective:  Lab Results  Component Value Date   CREATININE 0.95 04/27/2021   CREATININE 0.82 03/13/2021   CREATININE 0.79 01/13/2021    Lab Results  Component  Value Date   HGBA1C 6.8 (H) 04/27/2021   Last diabetic Eye exam:  Lab Results  Component Value Date/Time   HMDIABEYEEXA No Retinopathy 01/29/2016 12:00 AM    Last diabetic Foot exam: No results found for: HMDIABFOOTEX      Component Value Date/Time   CHOL 156 04/27/2021 1056   TRIG 81 04/27/2021 1056   HDL 56 04/27/2021 1056   CHOLHDL 2.8 04/27/2021 1056   CHOLHDL 3.4 09/26/2014 1019   VLDL 17 09/26/2014 1019   LDLCALC 85 04/27/2021 1056    Hepatic Function Latest Ref Rng & Units 03/13/2021 01/13/2021 11/22/2019  Total Protein 6.5 - 8.1 g/dL 7.0 6.9 6.9  Albumin 3.5 - 5.0 g/dL 3.9 4.1 4.3  AST 15 - 41 U/L _0 ALT 0 - 44 U/L _1 Alk Phosphatase 38 - 126 U/L 75 78 80  Total Bilirubin 0.3 - 1.2 mg/dL 0.5 0.3 0.3  Bilirubin, Direct 0.00 - 0.40 mg/dL - - 0.12    Lab Results  Component Value Date/Time   TSH 1.838 03/13/2021 01:35 PM   TSH 1.280 03/15/2019 09:41 AM   TSH 2.380 10/04/2017 09:50 AM    CBC Latest Ref Rng & Units 03/13/2021 11/22/2019 03/15/2019  WBC 4.0 - 10.5 K/uL 7.1 5.9 6.0  Hemoglobin 12.0 - 15.0 g/dL 12.9 13.0 13.1  Hematocrit 36.0 - 46.0 % 41.4 40.8 41.6  Platelets 150 - 400 K/uL 298 323 308    No results found for: VD25OH  Clinical ASCVD: No  The 10-year ASCVD risk score (Arnett DK, et al., 2019) is: 26%   Values  used to calculate the score:     Age: 75 years     Sex: Female     Is Non-Hispanic African American: Yes     Diabetic: Yes     Tobacco smoker: No     Systolic Blood Pressure: 967 mmHg     Is BP treated: Yes     HDL Cholesterol: 56 mg/dL     Total Cholesterol: 156 mg/dL    Social History   Tobacco Use  Smoking Status Never  Smokeless Tobacco Never  Tobacco Comments   Never smoked   BP Readings from Last 3 Encounters:  06/02/21 132/74  04/27/21 132/76  04/21/21 (!) 150/60   Pulse Readings from Last 3 Encounters:  06/02/21 (!) 41  04/21/21 83  03/24/21 (!) 45   Wt Readings from Last 3 Encounters:  06/02/21 240 lb  9.6 oz (109.1 kg)  04/27/21 237 lb 12.8 oz (107.9 kg)  04/21/21 238 lb 3.2 oz (108 kg)    Assessment: Review of patient past medical history, allergies, medications, health status, including review of consultants reports, laboratory and other test data, was performed as part of comprehensive evaluation and provision of chronic care management services.   SDOH:  (Social Determinants of Health) assessments and interventions performed:    CCM Care Plan  Allergies  Allergen Reactions   Ibuprofen     Do not take since she is on antiplatelet   Norvasc [Amlodipine Besylate] Other (See Comments)    Reaction:  Fatigue    Penicillins Hives and Other (See Comments)    Has patient had a PCN reaction causing immediate rash, facial/tongue/throat swelling, SOB or lightheadedness with hypotension: Yes Has patient had a PCN reaction causing severe rash involving mucus membranes or skin necrosis: No Has patient had a PCN reaction that required hospitalization No Has patient had a PCN reaction occurring within the last 10 years: No If all of the above answers are "NO", then may proceed with Cephalosporin use.    Advair Diskus [Fluticasone-Salmeterol] Palpitations   Metformin And Related Other (See Comments)    Reaction:  GI upset     Medications Reviewed Today     Reviewed by Beryle Lathe, [facility] (Pharmacist) on 07/17/21 at [place] List Status: <None>   Medication Order Taking? Sig Documenting Provider Last Dose Status Informant  acetaminophen (TYLENOL) 500 MG tablet [id number] Yes Take 1,000 mg by mouth every 6 (six) hours as needed for mild pain, moderate pain or headache.  [provider] Taking Active Self  clopidogrel (PLAVIX) 75 MG tablet [id number] Yes TAKE 1 TABLET (75 MG TOTAL) BY MOUTH DAILY. Arnoldo Lenis, MD Taking Active   diclofenac Sodium (VOLTAREN) 1 % GEL [id number] Yes Apply 2 g topically 4 (four) times daily. Evelina Bucy, DPM Taking Active   glucose  blood (PRODIGY NO CODING BLOOD GLUC) test strip [id number]  USE AS DIRECTED ONE TIME DAILY [name], MD  Active   linaclotide [facility]) 145 MCG CAPS capsule [id number] Yes Take 1 capsule (145 mcg total) by mouth daily before breakfast. Mahala Menghini, PA-C Taking Active   losartan (COZAAR) 50 MG tablet [id number] Yes Take 1 tablet (50 mg total) by mouth daily. Arnoldo Lenis, MD Taking Active   Multiple Vitamins-Minerals (MULTIVITAMIN WITH MINERALS) tablet [id number] Yes Take 1 tablet by mouth daily. [provider] Taking Active Self  pantoprazole (PROTONIX) 40 MG tablet [id number] Yes TAKE 1 TABLET EVERY DAY Luking, Elayne Snare, MD Taking Active  pravastatin (PRAVACHOL) 20 MG tablet [id number] Yes TAKE 1 TABLET AT BEDTIME [name], MD Taking Active             Patient Active Problem List   Diagnosis Date Noted   Nausea without vomiting 04/17/2020   History of adenomatous polyp of colon 01/16/2020   Fibrocystic breast disease (FCBD) 05/14/2019   Abdominal pain 01/15/2019   Diarrhea 11/09/2018   Depression, major, single episode, moderate ([place]) 10/05/2018   Hyperlipidemia associated with type 2 diabetes mellitus ([place]) 06/06/2018   Morbid obesity ([place]) 01/18/2018   Encounter for dental exam and cleaning w/o abnormal findings 01/03/2018   GERD (gastroesophageal reflux disease) 09/05/2017   Diverticulitis 05/04/2016   IBS (irritable bowel syndrome) 12/29/2015   Constipation 02/03/2015   Dyspepsia    Loss of weight    Decreased appetite 09/16/2014   Unintentional weight loss 09/16/2014   Diabetes type 2, uncontrolled 06/27/2013   DVT of axillary vein, acute right ([place]) 11/24/2012   Type 2 diabetes mellitus with diabetic neuropathy, without long-term current use of insulin ([place]) 11/15/2012   Chest pain, unspecified 05/10/2011   Bradycardia 05/10/2011   HYPERCHOLESTEROLEMIA 08/29/2008   DEPRESSION 08/29/2008   Essential hypertension 08/29/2008   PEPTIC ULCER  DISEASE 08/29/2008   HIATAL HERNIA 08/29/2008   HEPATIC CYST 08/29/2008   Osteoarthritis 08/29/2008    Immunization History  Administered Date(s) Administered   DT (Pediatric) 11/13/2013   Fluad Quad(high Dose 65+) 07/04/2020   Influenza Split 06/27/2013   Influenza,inj,Quad PF,6+ Mos 06/18/2014, 06/13/2015, 05/27/2016, 06/24/2017, 06/06/2018, 05/14/2019   Influenza-Unspecified 06/02/2021   Moderna Sars-Covid-2 Vaccination 11/09/2019, 12/06/2019   Pneumococcal Conjugate-13 07/03/2014   Pneumococcal Polysaccharide-23 06/27/2013   Tdap 11/22/2019   Zoster Recombinat (Shingrix) 02/10/2018, 08/17/2018    Conditions to be addressed/monitored: HTN, HLD, and DMII  Care Plan : Medication Management  Updates made by Beryle Lathe, [place] since 07/17/2021 12:00 AM     Problem: HTN, T2DM, HLD   Priority: High  Onset Date: 06/26/2021     Goal: Disease Progression Prevention   Start Date: 06/26/2021  Expected End Date: 08/10/2021  Recent Progress: On track  Priority: High  Note:   Current Barriers:  Unable to independently afford treatment regimen Unable to achieve control of hyperlipidemia  Pharmacist Clinical Goal(s):  Patient will verbalize ability to afford treatment regimen achieve control of hyperlipidemia as evidenced by improved LDL through collaboration with PharmD and provider.   Interventions: 1:1 collaboration with [name], MD regarding development and update of comprehensive plan of care as evidenced by provider attestation and co-signature Inter-disciplinary care team collaboration (see longitudinal plan of care) Comprehensive medication review performed; medication list updated in electronic medical record  Type 2 Diabetes - Condition stable not address at this visit: Controlled; Most recent A1c at goal of <7% per ADA guidelines Current medications:  none Intolerances: metformin (GI) On a statin: yes On aspirin 81 mg daily: no, on plavix Last  microalbumin: unknown; on an ACEi/ARB: yes Continue to monitor A1c and blood glucose  Hypertension - Condition stable not address at this visit: Blood pressure under good control. Blood pressure is at goal of <130/80 mmHg per 2017 AHA/ACC guidelines. Current medications: losartan 50 mg by mouth once daily Intolerances: amlodipine Taking medications as directed: yes Side effects thought to be attributed to current medication regimen: no Current home blood pressure: not discussed today Continue losartan 50 mg by mouth once daily Encourage dietary sodium restriction/DASH diet Recommend regular aerobic exercise Recommend home blood  pressure monitoring to discuss at next visit Discussed need for medication compliance  Hyperlipidemia - On Track Progressing:YES: Uncontrolled. LDL above goal of <70 due to very high risk given 10-year risk >20% per 2020 AACE/ACE guidelines. Triglycerides at goal of <150 per 2020 AACE/ACE guidelines. Current medications: pravastatin 20 mg by mouth once daily Intolerances: none Taking medications as directed: yes Side effects thought to be attributed to current medication regimen: no Encourage dietary reduction of high fat containing foods such as butter, nuts, bacon, egg yolks, etc. Recommend regular aerobic exercise Discussed need for medication compliance Re-check lipid panel in 4-12 weeks Consider increasing dose of pravastatin to lower LDL below goal  Medication Cost: Patient reports cost concerns with Linzess. Copay is $45 per month per patient. Medication assistance evaluation complete. Based on household size and income, patient should qualify for Medicare Extra Help. Patient reports that she has applied and has been approved for this. However, if she were approved, Linzess copay would be $9.85. Patient instructed to call [employer] to see if she has in fact been approved for Medicare Extra Help and if not to help facilitate this.  Patient Goals/Self-Care  Activities Patient will:  Take medications as prescribed Check blood pressure at least once daily, document, and provide at future appointments Collaborate with provider on medication access solutions  Follow Up Plan: Telephone follow up appointment with care management team member scheduled for: 08/14/21     Medication Assistance:  Patient should qualify for Medicare Extra Help  Patient's preferred pharmacy is:  [facility], [place] [address] Phone: [phone] Fax: [phone]  [place], [name] - [address] [name] [address] Phone: [phone] Fax: [phone] Patient agrees to Care Plan and Follow-up. Follow Up:  Patient agrees to Care Plan and Follow-up.  Plan: Telephone follow up appointment with care management team member scheduled for:  08/14/21  Kennon Holter, PharmD Clinical Pharmacist [place] [phone]

## 2021-07-17 NOTE — Patient Instructions (Signed)
[name],  It was great to talk to you today!  Please call me with any questions or concerns.  Visit Information  Thank you for taking time to visit with me today. Please don't hesitate to contact me if I can be of assistance to you before our next scheduled telephone appointment.  Telephone follow up appointment with care management team member scheduled for:08/14/21  If you need to cancel or re-schedule our visit, please call [phone] and our care guide team will be happy to assist you.  Following is a list of the goals we discussed today:  Patient Goals/Self-Care Activities Patient will:  Take medications as prescribed Check blood pressure at least once daily, document, and provide at future appointments Collaborate with provider on medication access solutions  The patient verbalized understanding of instructions, educational materials, and care plan provided today and declined offer to receive copy of patient instructions, educational materials, and care plan.   Kennon Holter, PharmD Clinical Pharmacist [place] [phone]  Based on our conversation today, please contact [place] and have them help you apply for Medicare Extra Help (also known as low income subsidy).  [facility]     [address] [name]  [address] [phone]

## 2021-07-29 ENCOUNTER — Ambulatory Visit (INDEPENDENT_AMBULATORY_CARE_PROVIDER_SITE_OTHER): Payer: Medicare HMO | Admitting: Family Medicine

## 2021-07-29 ENCOUNTER — Other Ambulatory Visit: Payer: Self-pay

## 2021-07-29 ENCOUNTER — Encounter: Payer: Self-pay | Admitting: Family Medicine

## 2021-07-29 VITALS — BP 132/80 | Temp 97.3°F | Wt 243.0 lb

## 2021-07-29 DIAGNOSIS — E1169 Type 2 diabetes mellitus with other specified complication: Secondary | ICD-10-CM

## 2021-07-29 DIAGNOSIS — I10 Essential (primary) hypertension: Secondary | ICD-10-CM | POA: Diagnosis not present

## 2021-07-29 DIAGNOSIS — E1140 Type 2 diabetes mellitus with diabetic neuropathy, unspecified: Secondary | ICD-10-CM

## 2021-07-29 DIAGNOSIS — F321 Major depressive disorder, single episode, moderate: Secondary | ICD-10-CM | POA: Diagnosis not present

## 2021-07-29 DIAGNOSIS — Z79899 Other long term (current) drug therapy: Secondary | ICD-10-CM

## 2021-07-29 DIAGNOSIS — E785 Hyperlipidemia, unspecified: Secondary | ICD-10-CM

## 2021-07-29 MED ORDER — PRAVASTATIN SODIUM 40 MG PO TABS: ORAL_TABLET | ORAL | 1 refills | Status: DC

## 2021-07-29 NOTE — Progress Notes (Signed)
   Subjective:    Patient ID: [name], female    DOB: 10-25-1945, 75 y.o.   MRN: [id number]  Diabetes She presents for her follow-up diabetic visit. There are no hypoglycemic associated symptoms. There are no diabetic associated symptoms. There are no hypoglycemic complications. There are no diabetic complications. Risk factors for coronary artery disease include hypertension. When asked about current treatments, none were reported.    Pt having issues turning head to the right. Having headaches on right side of head that radiated down to neck.  States this happened about a month ago causes her difficulty rotating her head to the right and looking up causes some pain and discomfort in the back of the head as well denies any injury  Knot on lett side of ankle after surgery;unable to see podiatry until April.  Soreness and discomfort lateral aspect of the ankle sometimes feels unstable to her  Pt received a call from someone from [facility] stating that they received information from her doctor regarding Plavix not being a blood thinner. Pt states it was not Gerald Stabs.  After further discussion with the patient unsure of what happened with this call  Review of Systems     Objective:   Physical Exam General-in no acute distress Eyes-no discharge Lungs-respiratory rate normal, CTA CV-no murmurs,RRR Extremities skin warm dry no edema Neuro grossly normal Behavior normal, alert  Neck pain and discomfort right side of the neck I doubt that this is temporal arteritis I think this is more strain in the neck I do not find any evidence of nerve root impingement I would recommend physical therapy if exercises do not help  Superior lateral aspect of the ankle some slight tenderness with inversion no instability noted    Assessment & Plan:  Morbid obesity patient is going to work hard at trying to improve her diet and improve activity and bring her weight down she will follow-up again as  planned within a few months  Neck pain and discomfort recommend stretching recommend exercises as shown if not dramatically better within the next 2 weeks referral to physical therapy no need for x-ray currently  Left ankle some tendinitis recommend cool compresses and Tylenol as needed if ongoing  troubles follow-up with podiatry  Hyperlipidemia continue current medication bump up the dose to 40 mg.  Check labs before follow-up visit in 2 months  Diabetes healthy diet cut back on starches increase exercise bring weight down check labs before next visit

## 2021-07-30 ENCOUNTER — Other Ambulatory Visit: Payer: Self-pay

## 2021-07-30 ENCOUNTER — Telehealth: Payer: Self-pay | Admitting: Family Medicine

## 2021-07-30 DIAGNOSIS — R001 Bradycardia, unspecified: Secondary | ICD-10-CM

## 2021-07-30 DIAGNOSIS — I495 Sick sinus syndrome: Secondary | ICD-10-CM

## 2021-07-30 NOTE — Telephone Encounter (Signed)
Patient was seen yesterday and would like to go ahead with referral to heart doctor .She would like to go [facility] in [place] for second opinion. Please advise

## 2021-07-30 NOTE — Telephone Encounter (Signed)
Referral to [facility] cardiology in [place] for second opinion regarding sinus node dysfunction

## 2021-07-30 NOTE — Telephone Encounter (Signed)
Patient has been informed per drs notes Referral to [facility] cardiology in [place] has been placed.

## 2021-07-30 NOTE — Telephone Encounter (Signed)
Please advise. Thank you

## 2021-08-03 ENCOUNTER — Other Ambulatory Visit: Payer: Self-pay | Admitting: Family Medicine

## 2021-08-03 ENCOUNTER — Telehealth: Payer: Self-pay | Admitting: Family Medicine

## 2021-08-03 NOTE — Telephone Encounter (Signed)
Pt contacted and verbalized understanding. Pt states she will go back to 20 mg Pravastatin at this time and call the office if follow up is needed.

## 2021-08-03 NOTE — Telephone Encounter (Signed)
Sorry that the patient is having symptoms It is unlikely that the higher dose of pravastatin is causing this-her symptoms are not what we typically see with pravastatin If she is wanting to go back to the previous dose I am not opposed but it is possible that her symptoms are related to other issues May stick with pravastatin 20 mg for now If ongoing troubles or other issues they will need to do a follow-up visit with myself or Docia Barrier

## 2021-08-03 NOTE — Addendum Note (Signed)
Addended by: Vicente Males on: 08/03/2021 08:52 AM   Modules accepted: Orders

## 2021-08-03 NOTE — Telephone Encounter (Signed)
Pt was advised to double up on Pravastatin. Pt was on 20mg  now increased to 40. Pt states she is having stomach cramps, headache and lightheadedness. Pt states she had to lie down all weekend. Pt still had some of her 20 mg left so she has been taking 2 of them. Please advise. Thank you

## 2021-08-04 ENCOUNTER — Telehealth: Payer: Self-pay | Admitting: Family Medicine

## 2021-08-04 DIAGNOSIS — E1140 Type 2 diabetes mellitus with diabetic neuropathy, unspecified: Secondary | ICD-10-CM

## 2021-08-04 NOTE — Telephone Encounter (Signed)
Please go ahead thank you may have referral

## 2021-08-04 NOTE — Telephone Encounter (Signed)
Please advise. Thank you

## 2021-08-04 NOTE — Telephone Encounter (Signed)
Pamala Hurry from [facility] needing updated referral on patient sent to her.

## 2021-08-04 NOTE — Telephone Encounter (Signed)
Referral placed and pt is aware. 

## 2021-08-13 ENCOUNTER — Ambulatory Visit: Payer: Medicare HMO | Admitting: Nutrition

## 2021-08-14 ENCOUNTER — Telehealth: Payer: Medicare HMO

## 2021-08-17 ENCOUNTER — Encounter: Payer: Self-pay | Admitting: Family Medicine

## 2021-08-17 ENCOUNTER — Ambulatory Visit: Payer: Medicare HMO | Admitting: Nutrition

## 2021-08-17 ENCOUNTER — Ambulatory Visit (INDEPENDENT_AMBULATORY_CARE_PROVIDER_SITE_OTHER): Payer: Medicare HMO | Admitting: Family Medicine

## 2021-08-17 ENCOUNTER — Other Ambulatory Visit: Payer: Self-pay

## 2021-08-17 VITALS — Temp 98.8°F | Wt 242.4 lb

## 2021-08-17 DIAGNOSIS — B349 Viral infection, unspecified: Secondary | ICD-10-CM

## 2021-08-17 MED ORDER — HYDROCODONE BIT-HOMATROP MBR 5-1.5 MG/5ML PO SOLN: ORAL | 0 refills | Status: DC

## 2021-08-17 NOTE — Progress Notes (Signed)
° °  Subjective:    Patient ID: [name], female    DOB: Sep 01, 1945, 75 y.o.   MRN: [id number]  HPI Pt having dry cough and headache. Started Saturday but worsened yesterday. No issues breathing. Pt states "feels like I when I lay down it goes through my back".   Patient presents today with respiratory illness Number of days present-2  Symptoms include-headaches sore throat not feeling good fatigue tiredness feeling rundown  Presence of worrisome signs (severe shortness of breath, lethargy, etc.) -denies wheezing difficulty breathing  Recent/current visit to urgent care or ER-none  Recent direct exposure to Covid-not that she knows of  Any current Covid testing-none   Review of Systems     Objective:   Physical Exam Gen-NAD not toxic TMS-normal bilateral T- normal no redness Chest-CTA respiratory rate normal no crackles CV RRR no murmur Skin-warm dry Neuro-grossly normal   Patient not toxic no respiratory distress     Assessment & Plan:  Viral syndrome Triple swab Rest up Warning signs discussed Follow-up on results

## 2021-08-19 ENCOUNTER — Telehealth: Payer: Self-pay

## 2021-08-19 LAB — COVID-19, FLU A+B AND RSV
Influenza A, NAA: NOT DETECTED
Influenza B, NAA: NOT DETECTED
RSV, NAA: NOT DETECTED
SARS-CoV-2, NAA: DETECTED — AB

## 2021-08-19 MED ORDER — MOLNUPIRAVIR EUA 200MG CAPSULE: 4.0000 | ORAL_CAPSULE | Freq: Two times a day (BID) | ORAL | 0 refills | Status: AC

## 2021-08-19 NOTE — Telephone Encounter (Signed)
Patient states she's picked up the antiviral medication and would like to double check if she needs to,  can she still take the cough syrup , hycodan,  which was prescribed on 08/17/21. Please advise

## 2021-08-19 NOTE — Telephone Encounter (Signed)
Patient has been informed per drs recommendations 

## 2021-08-19 NOTE — Addendum Note (Signed)
Addended by: Dairl Ponder on: 08/19/2021 10:54 AM   Modules accepted: Orders

## 2021-08-19 NOTE — Telephone Encounter (Signed)
Yes she may use the hycodan-caution drowsiness

## 2021-08-24 ENCOUNTER — Emergency Department (HOSPITAL_COMMUNITY)
Admission: EM | Admit: 2021-08-24 | Discharge: 2021-08-24 | Discharge status: Against Medical Advice | Payer: Medicare HMO | Source: Home / Self Care

## 2021-08-25 ENCOUNTER — Other Ambulatory Visit: Payer: Self-pay

## 2021-08-25 ENCOUNTER — Telehealth: Payer: Self-pay | Admitting: Family Medicine

## 2021-08-25 ENCOUNTER — Encounter: Payer: Self-pay | Admitting: Emergency Medicine

## 2021-08-25 ENCOUNTER — Ambulatory Visit
Admission: EM | Admit: 2021-08-25 | Discharge: 2021-08-25 | Disposition: A | Discharge status: Home / Self Care | Payer: Medicare HMO | Attending: Family Medicine | Admitting: Family Medicine

## 2021-08-25 DIAGNOSIS — R112 Nausea with vomiting, unspecified: Secondary | ICD-10-CM

## 2021-08-25 DIAGNOSIS — R42 Dizziness and giddiness: Secondary | ICD-10-CM

## 2021-08-25 DIAGNOSIS — R5383 Other fatigue: Secondary | ICD-10-CM

## 2021-08-25 DIAGNOSIS — R030 Elevated blood-pressure reading, without diagnosis of hypertension: Secondary | ICD-10-CM

## 2021-08-25 LAB — POCT FASTING CBG KUC MANUAL ENTRY: POCT Glucose (KUC): 112 mg/dL — AB (ref 70–99)

## 2021-08-25 MED ORDER — MECLIZINE HCL 12.5 MG PO TABS: 12.5000 mg | ORAL_TABLET | Freq: Two times a day (BID) | ORAL | 0 refills | Status: DC | PRN

## 2021-08-25 MED ORDER — ONDANSETRON 8 MG PO TBDP: 8.0000 mg | ORAL_TABLET | Freq: Three times a day (TID) | ORAL | 0 refills | Status: DC | PRN

## 2021-08-25 MED ORDER — ONDANSETRON 4 MG PO TBDP
4.0000 mg | ORAL_TABLET | Freq: Once | ORAL | Status: AC
  Administered 2021-08-25: 16:00:00 4 mg via ORAL

## 2021-08-25 MED ORDER — ONDANSETRON 4 MG PO TBDP
4.0000 mg | ORAL_TABLET | Freq: Once | ORAL | Status: AC
  Administered 2021-08-25: 15:00:00 4 mg via ORAL

## 2021-08-25 MED ORDER — SODIUM CHLORIDE 0.9 % IV BOLUS
1000.0000 mL | Freq: Once | INTRAVENOUS | Status: AC
  Administered 2021-08-25: 16:00:00 1000 mL via INTRAVENOUS

## 2021-08-25 NOTE — Telephone Encounter (Signed)
Left message to return call 

## 2021-08-25 NOTE — Discharge Instructions (Signed)
Follow up with Primary Care in next few days for re-check of symptoms. Go to ED if worsening at any time

## 2021-08-25 NOTE — ED Triage Notes (Addendum)
Patient c/o dizziness x 1 day Patient c/o emesis x 3 day.   Patient endorses feeling dizzy when "coming up from sitting or laying down".   Patient endorses emesis isn't associated with the pattern of emesis.   Patient endorses headache at times. Patient endorses inability to "keep food down".   Patient denies SOB or Chest Pain.   Patient endorses taking Linzess yesterday morning to help with "bloating".   Patient endorses being diagnosed with COVID on 12/19. Patient has taken molnupavir (last dose was on "Sunday").   Patient took [place] today.

## 2021-08-25 NOTE — Telephone Encounter (Signed)
Pt called and stated she is very sick. States she is having a hard time holding her head up and feels dizzy when she gets up. I suggested Urgent Care as Dr Nicki Reaper does not have any openings. She stated she went to the ER yesterday but left because there were too many people. Pt insistent on seeing Dr Nicki Reaper today. Told her I would have a nurse call her. [phone]

## 2021-08-27 NOTE — Telephone Encounter (Signed)
Pt contacted. Pt states that she went to Urgent care. Blood pressure elevated and pt was dehydrated. Pt was given fluids and is at home resting at this time. Pt states she feels a little better today.

## 2021-08-29 NOTE — ED Provider Notes (Signed)
[facility]    CSN: [id number] Arrival date & time: 08/25/21  1227      History   Chief Complaint Chief Complaint  Patient presents with   Dizziness   Emesis    HPI [name] is a 75 y.o. female.   Presenting today with 3 day history of N/V, occasional headache, fatigue, weakness, room spinning dizziness with laying flat. States she was diagnosed with COVID 08/17/21, completed molnupiravir and states her URI sxs improved but now GI sxs. Not tolerating any PO for past several days. Not trying anything OTC for sxs. Hx significant for DM 2, GERD, IBS, PUD on linzess, protonix. Denies CP, SOB, palpitations, syncope, fever, urinary sxs.   Past Medical History:  Diagnosis Date   Blood clot of artery under arm ([place])    Cyst on liver   Depression    Diabetes mellitus    controlled by diet   Diverticulitis    DVT of axillary vein, acute right ([place]) 11/24/2012   Dx on Jan 28, 2012.     Fibrocystic breast disease    Hepatic cyst    Hypertension    IBS (irritable bowel syndrome)    Impaired glucose tolerance    Menopause     Patient Active Problem List   Diagnosis Date Noted   Nausea without vomiting 04/17/2020   History of adenomatous polyp of colon 01/16/2020   Fibrocystic breast disease (FCBD) 05/14/2019   Abdominal pain 01/15/2019   Diarrhea 11/09/2018   Depression, major, single episode, moderate ([place]) 10/05/2018   Hyperlipidemia associated with type 2 diabetes mellitus ([place]) 06/06/2018   Morbid obesity ([place]) 01/18/2018   Encounter for dental exam and cleaning w/o abnormal findings 01/03/2018   GERD (gastroesophageal reflux disease) 09/05/2017   Diverticulitis 05/04/2016   IBS (irritable bowel syndrome) 12/29/2015   Constipation 02/03/2015   Dyspepsia    Loss of weight    Decreased appetite 09/16/2014   Unintentional weight loss 09/16/2014   Diabetes type 2, uncontrolled 06/27/2013   DVT of axillary vein, acute right ([place]) 11/24/2012    Type 2 diabetes mellitus with diabetic neuropathy, without long-term current use of insulin ([place]) 11/15/2012   Chest pain, unspecified 05/10/2011   Bradycardia 05/10/2011   HYPERCHOLESTEROLEMIA 08/29/2008   DEPRESSION 08/29/2008   Essential hypertension 08/29/2008   PEPTIC ULCER DISEASE 08/29/2008   HIATAL HERNIA 08/29/2008   HEPATIC CYST 08/29/2008   Osteoarthritis 08/29/2008    Past Surgical History:  Procedure Laterality Date   ABDOMINAL HYSTERECTOMY     BACK SURGERY     BREAST SURGERY  right   cyst removed   CAST APPLICATION  10/28/6008   Procedure: CAST APPLICATION;  Surgeon: Carole Civil, MD;  Location: [facility];  Service: Orthopedics;  Laterality: Right;  procedure room    CHOLECYSTECTOMY     COLONOSCOPY  12/04/2004   NUR:Few small diverticula at sigmoid colon/Small cecal polyp ablated    COLONOSCOPY N/A 09/24/2014   SLF: small internal hemorrhoids   COLONOSCOPY N/A 02/15/2020   Procedure: COLONOSCOPY;  Surgeon: [name], MD;  Location: [facility];  Service: Endoscopy;  Laterality: N/A;  8:30am   ESOPHAGOGASTRODUODENOSCOPY N/A 02/02/2016   Procedure: ESOPHAGOGASTRODUODENOSCOPY (EGD);  Surgeon: Danie Binder, MD;  Location: [facility];  Service: Endoscopy;  Laterality: N/A;  930    KNEE ARTHROCENTESIS  left   POLYPECTOMY  02/15/2020   Procedure: POLYPECTOMY;  Surgeon: [name], MD;  Location: [facility];  Service: Endoscopy;;  TUBAL LIGATION      OB History     Gravida  3   Para  3   Term  3   Preterm      AB      Living  3      SAB      IAB      Ectopic      Multiple      Live Births               Home Medications    Prior to Admission medications   Medication Sig Start Date End Date Taking? Authorizing Provider  acetaminophen (TYLENOL) 500 MG tablet Take 1,000 mg by mouth every 6 (six) hours as needed for mild pain, moderate pain or headache.    Yes [provider]  clopidogrel (PLAVIX) 75 MG tablet TAKE  1 TABLET (75 MG TOTAL) BY MOUTH DAILY. 12/02/20  Yes Branch, Alphonse Guild, MD  HYDROcodone bit-homatropine [facility]) 5-1.5 MG/5ML syrup 1 tsp q 6 hours prn cough home use evening only 08/17/21  Yes Luking, Elayne Snare, MD  linaclotide [facility]) 145 MCG CAPS capsule Take 1 capsule (145 mcg total) by mouth daily before breakfast. 03/24/21  Yes Mahala Menghini, PA-C  losartan (COZAAR) 50 MG tablet Take 1 tablet (50 mg total) by mouth daily. 05/25/21  Yes BranchAlphonse Guild, MD  meclizine (ANTIVERT) 12.5 MG tablet Take 1 tablet (12.5 mg total) by mouth 2 (two) times daily as needed for dizziness. May cause drowsiness 08/25/21  Yes Volney American, PA-C  Multiple Vitamins-Minerals (MULTIVITAMIN WITH MINERALS) tablet Take 1 tablet by mouth daily.   Yes [provider]  ondansetron (ZOFRAN-ODT) 8 MG disintegrating tablet Take 1 tablet (8 mg total) by mouth every 8 (eight) hours as needed for nausea or vomiting. 08/25/21  Yes Volney American, PA-C  pantoprazole (PROTONIX) 40 MG tablet TAKE 1 TABLET EVERY DAY 06/10/21  Yes [name], MD  pravastatin (PRAVACHOL) 40 MG tablet TAKE ONE TABLET PO EACH EVENING 07/29/21  Yes Luking, Scott A, MD  diclofenac Sodium (VOLTAREN) 1 % GEL Apply 2 g topically 4 (four) times daily. 03/20/21   Evelina Bucy, DPM  glucose blood (PRODIGY NO CODING BLOOD GLUC) test strip USE AS DIRECTED ONE TIME DAILY 03/11/21   [name], MD    Family History Family History  Problem Relation Age of Onset   Stroke Mother    Heart attack Mother    Cancer Sister    Diabetes Sister    Seizures Brother    Diabetes Brother    Heart disease Brother    Kidney disease Son    Colon cancer Neg Hx     Social History Social History   Tobacco Use   Smoking status: Never   Smokeless tobacco: Never   Tobacco comments:    Never smoked  Vaping Use   Vaping Use: Never used  Substance Use Topics   Alcohol use: No    Alcohol/week: 0.0 standard drinks   Drug use: No      Allergies   Ibuprofen, Norvasc [amlodipine besylate], Penicillins, Advair diskus [fluticasone-salmeterol], and Metformin and related   Review of Systems Review of Systems PER HPI  Physical Exam Triage Vital Signs ED Triage Vitals [08/25/21 1503]  Enc Vitals Group     BP (!) 197/106     Pulse Rate 61     Resp 18     Temp 98.4 F (36.9 C)  Temp Source Oral     SpO2 97 %     Weight      Height      Head Circumference      Peak Flow      Pain Score 0     Pain Loc      Pain Edu?      Excl. in [place]?    No data found.  Updated Vital Signs BP (!) 197/106 (BP Location: Right Arm)    Pulse 61    Temp 98.4 F (36.9 C) (Oral)    Resp 18    SpO2 97%   Visual Acuity Right Eye Distance:   Left Eye Distance:   Bilateral Distance:    Right Eye Near:   Left Eye Near:    Bilateral Near:     Physical Exam Vitals and nursing note reviewed.  Constitutional:      Appearance: She is not ill-appearing.  HENT:     Head: Atraumatic.     Mouth/Throat:     Mouth: Mucous membranes are moist.  Eyes:     Extraocular Movements: Extraocular movements intact.     Conjunctiva/sclera: Conjunctivae normal.  Cardiovascular:     Rate and Rhythm: Normal rate and regular rhythm.     Heart sounds: Normal heart sounds.  Pulmonary:     Effort: Pulmonary effort is normal.     Breath sounds: Normal breath sounds.  Abdominal:     General: Bowel sounds are normal. There is no distension.     Palpations: Abdomen is soft.     Tenderness: There is no abdominal tenderness. There is no guarding.  Musculoskeletal:        General: Normal range of motion.     Cervical back: Normal range of motion and neck supple.  Skin:    General: Skin is warm and dry.  Neurological:     General: No focal deficit present.     Mental Status: She is alert and oriented to person, place, and time.     Motor: No weakness.     Gait: Gait normal.  Psychiatric:        Mood and Affect: Mood normal.        Thought  Content: Thought content normal.        Judgment: Judgment normal.     UC Treatments / Results  Labs (all labs ordered are listed, but only abnormal results are displayed) Labs Reviewed  POCT FASTING CBG [facility] MANUAL ENTRY - Abnormal; Notable for the following components:      Result Value   POCT Glucose ([facility]) 112 (*)    All other components within normal limits    EKG   Radiology No results found.  Procedures Procedures (including critical care time)  Medications Ordered in UC Medications  ondansetron (ZOFRAN-ODT) disintegrating tablet 4 mg (4 mg Oral Given 08/25/21 1510)  ondansetron (ZOFRAN-ODT) disintegrating tablet 4 mg (4 mg Oral Given 08/25/21 1543)  sodium chloride 0.9 % bolus 1,000 mL (0 mLs Intravenous Stopped 08/25/21 1711)    Initial Impression / Assessment and Plan / UC Course  I have reviewed the triage vital signs and the nursing notes.  Pertinent labs & imaging results that were available during my care of the patient were reviewed by me and considered in my medical decision making (see chart for details).     Hypertensive in triage, otherwise vital signs reassuring. Orthostatic vital signs with a fairly significant change upon standing, glucose 112. Zofran 4 mg given  x 2 as first dose did not resolve nausea and vomiting during PO challenge but able to drink glass of water and eat crackers following second dose. IV fluids administered given poor PO tolerance over past few days and weakness. Antivert and zofran sent for prn use at home, [employer], push fluids. Strict ED precautions reviewed if not improving.   90 minutes spent today in direct patient care, management and education today.   Final Clinical Impressions(s) / UC Diagnoses   Final diagnoses:  Nausea and vomiting, unspecified vomiting type  Fatigue, unspecified type  Dizziness  Elevated blood pressure reading     Discharge Instructions      Follow up with Primary Care in next few days for  re-check of symptoms. Go to ED if worsening at any time    ED Prescriptions     Medication Sig Dispense Auth. Provider   ondansetron (ZOFRAN-ODT) 8 MG disintegrating tablet Take 1 tablet (8 mg total) by mouth every 8 (eight) hours as needed for nausea or vomiting. 20 tablet Volney American, [place]   meclizine (ANTIVERT) 12.5 MG tablet Take 1 tablet (12.5 mg total) by mouth 2 (two) times daily as needed for dizziness. May cause drowsiness 10 tablet Volney American, [place]      PDMP not reviewed this encounter.   Volney American, [place] 08/29/21 2102

## 2021-09-07 ENCOUNTER — Encounter: Payer: Self-pay | Admitting: Gastroenterology

## 2021-09-11 ENCOUNTER — Telehealth: Payer: Medicare HMO

## 2021-09-23 ENCOUNTER — Other Ambulatory Visit: Payer: Self-pay | Admitting: Cardiology

## 2021-09-25 ENCOUNTER — Ambulatory Visit: Payer: Medicare HMO | Admitting: Gastroenterology

## 2021-09-25 DIAGNOSIS — Z79899 Other long term (current) drug therapy: Secondary | ICD-10-CM | POA: Diagnosis not present

## 2021-09-25 DIAGNOSIS — I10 Essential (primary) hypertension: Secondary | ICD-10-CM | POA: Diagnosis not present

## 2021-09-25 DIAGNOSIS — E119 Type 2 diabetes mellitus without complications: Secondary | ICD-10-CM | POA: Diagnosis not present

## 2021-09-25 DIAGNOSIS — E1169 Type 2 diabetes mellitus with other specified complication: Secondary | ICD-10-CM | POA: Diagnosis not present

## 2021-09-25 DIAGNOSIS — E785 Hyperlipidemia, unspecified: Secondary | ICD-10-CM | POA: Diagnosis not present

## 2021-09-25 DIAGNOSIS — E1140 Type 2 diabetes mellitus with diabetic neuropathy, unspecified: Secondary | ICD-10-CM | POA: Diagnosis not present

## 2021-09-26 ENCOUNTER — Encounter: Payer: Self-pay | Admitting: Family Medicine

## 2021-09-26 LAB — LIPID PANEL
Chol/HDL Ratio: 3.1 ratio (ref 0.0–4.4)
Cholesterol, Total: 154 mg/dL (ref 100–199)
HDL: 49 mg/dL (ref >39)
LDL Chol Calc (NIH): 85 mg/dL (ref 0–99)
Triglycerides: 108 mg/dL (ref 0–149)
VLDL Cholesterol Cal: 20 mg/dL (ref 5–40)

## 2021-09-26 LAB — HEPATIC FUNCTION PANEL
ALT: 15 IU/L (ref 0–32)
AST: 18 IU/L (ref 0–40)
Albumin: 4.3 g/dL (ref 3.7–4.7)
Alkaline Phosphatase: 71 IU/L (ref 44–121)
Bilirubin Total: 0.3 mg/dL (ref 0.0–1.2)
Bilirubin, Direct: <0.1 mg/dL (ref 0.00–0.40)
Total Protein: 6.7 g/dL (ref 6.0–8.5)

## 2021-09-26 LAB — MICROALBUMIN / CREATININE URINE RATIO
Creatinine, Urine: 162.9 mg/dL
Microalb/Creat Ratio: 7 mg/g creat (ref 0–29)
Microalbumin, Urine: 10.9 ug/mL

## 2021-09-26 LAB — BASIC METABOLIC PANEL
BUN/Creatinine Ratio: 21 (ref 12–28)
BUN: 20 mg/dL (ref 8–27)
CO2: 27 mmol/L (ref 20–29)
Calcium: 9.5 mg/dL (ref 8.7–10.3)
Chloride: 103 mmol/L (ref 96–106)
Creatinine, Ser: 0.94 mg/dL (ref 0.57–1.00)
Glucose: 130 mg/dL — ABNORMAL HIGH (ref 70–99)
Potassium: 4.6 mmol/L (ref 3.5–5.2)
Sodium: 142 mmol/L (ref 134–144)
eGFR: 64 mL/min/{1.73_m2} (ref >59)

## 2021-09-26 LAB — HEMOGLOBIN A1C
Est. average glucose Bld gHb Est-mCnc: 154 mg/dL
Hgb A1c MFr Bld: 7 % — ABNORMAL HIGH (ref 4.8–5.6)

## 2021-10-05 ENCOUNTER — Ambulatory Visit (INDEPENDENT_AMBULATORY_CARE_PROVIDER_SITE_OTHER): Payer: Medicare HMO | Admitting: Pharmacist

## 2021-10-05 DIAGNOSIS — E1140 Type 2 diabetes mellitus with diabetic neuropathy, unspecified: Secondary | ICD-10-CM

## 2021-10-05 DIAGNOSIS — E1169 Type 2 diabetes mellitus with other specified complication: Secondary | ICD-10-CM

## 2021-10-05 DIAGNOSIS — I10 Essential (primary) hypertension: Secondary | ICD-10-CM

## 2021-10-05 DIAGNOSIS — E785 Hyperlipidemia, unspecified: Secondary | ICD-10-CM

## 2021-10-05 NOTE — Chronic Care Management (AMB) (Signed)
Chronic Care Management Pharmacy Note  10/05/2021 Name:  [name] MRN:  [id number] DOB:  [birth year]  Summary: Type 2 Diabetes Most recent A1c increased from 6.8% to 7.0% but remains at goal per ADA guidelines  Hyperlipidemia Despite recent increase in pravastatin dose, LDL remains unchanged and slightly above goal of <70 due to very high risk given 10-year risk >20% per 2020 AACE/ACE guidelines.  Current medications: pravastatin 40 mg by mouth once daily (increased from 20 mg daily in November 2022 per primary care provider) Encourage dietary reduction of high fat containing foods such as butter, nuts, bacon, egg yolks, etc. Consider increasing to higher intensity statin such as rosuvastatin 20 mg by mouth daily to lower LDL below goal and reduce cardiovascular risk. Patient resistant to change today. Wishes to discuss further with primary care provider at upcoming visit in 1 month.  Medication Cost: Patient no longer reports cost concerns with Linzess. She reports that she is approved for Medicare Extra Help but her copay is $45 per month. Patient reports that she is only using Linzess as needed now and has plenty of samples from her provider at this time. If cost becomes a concern again, discussed that she can have her gastrointestinal provider assist her with [employer] patient assistance program application.   Subjective: [name] is an 76 y.o. year old female who is a primary patient of Luking, Elayne Snare, MD.  The CCM team was consulted for assistance with disease management and care coordination needs.    Engaged with patient by telephone for follow up visit in response to provider referral for pharmacy case management and/or care coordination services.   Consent to Services:  The patient was given information about Chronic Care Management services, agreed to services, and gave verbal consent prior to initiation of services.  Please see initial visit note for  detailed documentation.   Patient Care Team: [name], MD as PCP - General (Family Medicine) Arnoldo Lenis, MD as PCP - Cardiology (Cardiology) Eloise Harman, DO as Consulting Physician (Internal Medicine) Beryle Lathe, [facility] (Pharmacist)  Objective:  Lab Results  Component Value Date   CREATININE 0.94 09/25/2021   CREATININE 0.95 04/27/2021   CREATININE 0.82 03/13/2021    Lab Results  Component Value Date   HGBA1C 7.0 (H) 09/25/2021   Last diabetic Eye exam:  Lab Results  Component Value Date/Time   HMDIABEYEEXA No Retinopathy 01/29/2016 12:00 AM    Last diabetic Foot exam: No results found for: HMDIABFOOTEX      Component Value Date/Time   CHOL 154 09/25/2021 0830   TRIG 108 09/25/2021 0830   HDL 49 09/25/2021 0830   CHOLHDL 3.1 09/25/2021 0830   CHOLHDL 3.4 09/26/2014 1019   VLDL 17 09/26/2014 1019   LDLCALC 85 09/25/2021 0830    Hepatic Function Latest Ref Rng & Units 09/25/2021 03/13/2021 01/13/2021  Total Protein 6.0 - 8.5 g/dL 6.7 7.0 6.9  Albumin 3.7 - 4.7 g/dL 4.3 3.9 4.1  AST 0 - 40 IU/L 18 20 19   ALT 0 - 32 IU/L 15 19 17   Alk Phosphatase 44 - 121 IU/L 71 75 78  Total Bilirubin 0.0 - 1.2 mg/dL 0.3 0.5 0.3  Bilirubin, Direct 0.00 - 0.40 mg/dL <0.10 - -    Lab Results  Component Value Date/Time   TSH 1.838 03/13/2021 01:35 PM   TSH 1.280 03/15/2019 09:41 AM   TSH 2.380 10/04/2017 09:50 AM    CBC Latest Ref Rng &  Units 03/13/2021 11/22/2019 03/15/2019  WBC 4.0 - 10.5 K/uL 7.1 5.9 6.0  Hemoglobin 12.0 - 15.0 g/dL 12.9 13.0 13.1  Hematocrit 36.0 - 46.0 % 41.4 40.8 41.6  Platelets 150 - 400 K/uL 298 323 308    No results found for: VD25OH  Clinical ASCVD: No  The 10-year ASCVD risk score (Arnett DK, et al., 2019) is: 43.1%   Values used to calculate the score:     Age: 76 years     Sex: Female     Is Non-Hispanic African American: Yes     Diabetic: Yes     Tobacco smoker: No     Systolic Blood Pressure: 762 mmHg     Is BP  treated: Yes     HDL Cholesterol: 49 mg/dL     Total Cholesterol: 154 mg/dL    Social History   Tobacco Use  Smoking Status Never  Smokeless Tobacco Never  Tobacco Comments   Never smoked   BP Readings from Last 3 Encounters:  08/25/21 (!) 197/106  07/29/21 132/80  06/02/21 132/74   Pulse Readings from Last 3 Encounters:  08/25/21 61  06/02/21 (!) 41  04/21/21 83   Wt Readings from Last 3 Encounters:  08/17/21 242 lb 6.4 oz (110 kg)  07/29/21 243 lb (110.2 kg)  06/02/21 240 lb 9.6 oz (109.1 kg)    Assessment: Review of patient past medical history, allergies, medications, health status, including review of consultants reports, laboratory and other test data, was performed as part of comprehensive evaluation and provision of chronic care management services.   SDOH:  (Social Determinants of Health) assessments and interventions performed:    CCM Care Plan  Allergies  Allergen Reactions   Ibuprofen     Do not take since she is on antiplatelet   Norvasc [Amlodipine Besylate] Other (See Comments)    Reaction:  Fatigue    Penicillins Hives and Other (See Comments)    Has patient had a PCN reaction causing immediate rash, facial/tongue/throat swelling, SOB or lightheadedness with hypotension: Yes Has patient had a PCN reaction causing severe rash involving mucus membranes or skin necrosis: No Has patient had a PCN reaction that required hospitalization No Has patient had a PCN reaction occurring within the last 10 years: No If all of the above answers are "NO", then may proceed with Cephalosporin use.    Advair Diskus [Fluticasone-Salmeterol] Palpitations   Metformin And Related Other (See Comments)    Reaction:  GI upset     Medications Reviewed Today     Reviewed by Beryle Lathe, [facility] (Pharmacist) on 10/05/21 at 1242  Med List Status: <None>   Medication Order Taking? Sig Documenting Provider Last Dose Status Informant  acetaminophen (TYLENOL) 500 MG  tablet [id number] Yes Take 1,000 mg by mouth every 6 (six) hours as needed for mild pain, moderate pain or headache.  [provider] Taking Active Self  clopidogrel (PLAVIX) 75 MG tablet [id number] Yes TAKE 1 TABLET EVERY DAY Branch, Alphonse Guild, MD Taking Active   diclofenac Sodium (VOLTAREN) 1 % GEL [id number] Yes Apply 2 g topically 4 (four) times daily. Evelina Bucy, DPM Taking Active   glucose blood (PRODIGY NO CODING BLOOD GLUC) test strip [id number] Yes USE AS DIRECTED ONE TIME DAILY [name], MD Taking Active   linaclotide [facility]) 145 MCG CAPS capsule [id number] Yes Take 1 capsule (145 mcg total) by mouth daily before breakfast. Mahala Menghini, PA-C Taking Active  Med Note Rhea Belton Oct 05, 2021 12:36 PM) Only takes as needed  losartan (COZAAR) 50 MG tablet [id number] Yes Take 1 tablet (50 mg total) by mouth daily. Arnoldo Lenis, MD Taking Active   Multiple Vitamins-Minerals (MULTIVITAMIN WITH MINERALS) tablet [id number] Yes Take 1 tablet by mouth daily. [provider] Taking Active Self  pantoprazole (PROTONIX) 40 MG tablet [id number] Yes TAKE 1 TABLET EVERY DAY Luking, Scott A, MD Taking Active   pravastatin (PRAVACHOL) 40 MG tablet [id number] Yes TAKE ONE TABLET PO EACH EVENING [name], MD Taking Active             Patient Active Problem List   Diagnosis Date Noted   Nausea without vomiting 04/17/2020   History of adenomatous polyp of colon 01/16/2020   Fibrocystic breast disease (FCBD) 05/14/2019   Abdominal pain 01/15/2019   Diarrhea 11/09/2018   Depression, major, single episode, moderate ([place]) 10/05/2018   Hyperlipidemia associated with type 2 diabetes mellitus ([place]) 06/06/2018   Morbid obesity ([place]) 01/18/2018   Encounter for dental exam and cleaning w/o abnormal findings 01/03/2018   GERD (gastroesophageal reflux disease) 09/05/2017   Diverticulitis 05/04/2016   IBS (irritable bowel syndrome) 12/29/2015    Constipation 02/03/2015   Dyspepsia    Loss of weight    Decreased appetite 09/16/2014   Unintentional weight loss 09/16/2014   Diabetes type 2, uncontrolled 06/27/2013   DVT of axillary vein, acute right ([place]) 11/24/2012   Type 2 diabetes mellitus with diabetic neuropathy, without long-term current use of insulin ([place]) 11/15/2012   Chest pain, unspecified 05/10/2011   Bradycardia 05/10/2011   HYPERCHOLESTEROLEMIA 08/29/2008   DEPRESSION 08/29/2008   Essential hypertension 08/29/2008   PEPTIC ULCER DISEASE 08/29/2008   HIATAL HERNIA 08/29/2008   HEPATIC CYST 08/29/2008   Osteoarthritis 08/29/2008    Immunization History  Administered Date(s) Administered   DT (Pediatric) 11/13/2013   Fluad Quad(high Dose 65+) 07/04/2020   Influenza Split 06/27/2013   Influenza,inj,Quad PF,6+ Mos 06/18/2014, 06/13/2015, 05/27/2016, 06/24/2017, 06/06/2018, 05/14/2019   Influenza-Unspecified 06/02/2021   Moderna Sars-Covid-2 Vaccination 11/09/2019, 12/06/2019   Pneumococcal Conjugate-13 07/03/2014   Pneumococcal Polysaccharide-23 06/27/2013   Tdap 11/22/2019   Zoster Recombinat (Shingrix) 02/10/2018, 08/17/2018    Conditions to be addressed/monitored: HTN, HLD, and DMII  Care Plan : Medication Management  Updates made by Beryle Lathe, [place] since 10/05/2021 12:00 AM     Problem: HTN, T2DM, HLD   Priority: High  Onset Date: 06/26/2021     Goal: Disease Progression Prevention   Start Date: 06/26/2021  Expected End Date: 08/10/2021  Recent Progress: On track  Priority: High  Note:   Current Barriers:  Unable to independently afford treatment regimen Unable to achieve control of hyperlipidemia  Pharmacist Clinical Goal(s):  Through collaboration with PharmD and provider, patient will: Verbalize ability to afford treatment regimen Achieve control of hyperlipidemia as evidenced by improved LDL   Interventions: 1:1 collaboration with [name], MD regarding  development and update of comprehensive plan of care as evidenced by provider attestation and co-signature Inter-disciplinary care team collaboration (see longitudinal plan of care) Comprehensive medication review performed; medication list updated in electronic medical record  Type 2 Diabetes - Condition stable not address at this visit: Controlled; Most recent A1c increased from 6.8% to 7.0% but remains at goal per ADA guidelines Current medications: none Intolerances: metformin (GI) On a statin: yes On aspirin 81 mg daily: no, on plavix Last microalbumin/creatinine ratio: 7 (09/25/21); on an  ACEi/ARB: yes Continue to monitor A1c and blood glucose  Hypertension - Condition stable not address at this visit: Blood pressure under good control. Blood pressure is at goal of <130/80 mmHg per 2017 AHA/ACC guidelines. Current medications: losartan 50 mg by mouth once daily Intolerances: amlodipine (fatigue) Taking medications as directed: yes Side effects thought to be attributed to current medication regimen: no Current home blood pressure: not discussed today Continue losartan 50 mg by mouth once daily Encourage dietary sodium restriction/DASH diet Recommend regular aerobic exercise Recommend home blood pressure monitoring to discuss at next visit Discussed need for medication compliance  Hyperlipidemia - On Track Progressing:YES: Uncontrolled. Despite recent increase in pravastatin dose, LDL remains unchanged and slightly above goal of <70 due to very high risk given 10-year risk >20% per 2020 AACE/ACE guidelines. Triglycerides at goal of <150 per 2020 AACE/ACE guidelines. Current medications: pravastatin 40 mg by mouth once daily (increased from 20 mg daily in November 2022 per primary care provider) Intolerances: none Taking medications as directed: yes Side effects thought to be attributed to current medication regimen: no Encourage dietary reduction of high fat containing foods such  as butter, nuts, bacon, egg yolks, etc. Recommend regular aerobic exercise Discussed need for medication compliance Consider increasing to higher intensity statin such as rosuvastatin 20 mg by mouth daily to lower LDL below goal and reduce cardiovascular risk. Patient resistant to change today. Wishes to discuss further with primary care provider at upcoming visit in 1 month.  Medication Cost: Patient no longer reports cost concerns with Linzess. She reports that she is approved for Medicare Extra Help but her copay is $45 per month. Patient reports that she is only using Linzess as needed now and has plenty of samples from her provider at this time. If cost becomes a concern again, discussed that she can have her gastrointestinal provider assist her with [employer] patient assistance program application.   Patient Goals/Self-Care Activities Patient will:  Take medications as prescribed Check blood pressure at least once daily, document, and provide at future appointments Collaborate with provider on medication access solutions  Follow Up Plan: Telephone follow up appointment with care management team member scheduled for: 01/04/22      Medication Assistance: None required.  Patient affirms current coverage meets needs.  Patient's preferred pharmacy is:  [facility] [place], [place] [address] Phone: [phone] Fax: [phone]  [place], [name] - [address] [name] [address] Phone: [phone] Fax: [phone]  Follow Up:  Patient agrees to Care Plan and Follow-up.  Plan: Telephone follow up appointment with care management team member scheduled for:  01/04/22  Kennon Holter, PharmD, BCACP, CPP Clinical Pharmacist Practitioner [place] [phone]

## 2021-10-05 NOTE — Patient Instructions (Signed)
[name],  It was great to talk to you today!  Please call me with any questions or concerns.  Visit Information  Following are the goals we discussed today:   Goals Addressed             This Visit's Progress    Medication Management       Patient Goals/Self-Care Activities Patient will:  Take medications as prescribed Check blood pressure at least once daily, document, and provide at future appointments Collaborate with provider on medication access solutions          Follow-up plan: Telephone follow up appointment with care management team member scheduled for:  01/04/22  The patient verbalized understanding of instructions, educational materials, and care plan provided today and agreed to receive a mailed copy of patient instructions, educational materials, and care plan.   Please call the care guide team at [phone] if you need to cancel or reschedule your appointment.   Kennon Holter, PharmD, [place], CPP Clinical Pharmacist Practitioner [place] [phone]

## 2021-10-22 ENCOUNTER — Telehealth: Payer: Self-pay

## 2021-10-22 DIAGNOSIS — E1140 Type 2 diabetes mellitus with diabetic neuropathy, unspecified: Secondary | ICD-10-CM

## 2021-10-22 MED ORDER — BLOOD GLUCOSE MONITOR KIT: PACK | 0 refills | Status: DC

## 2021-10-22 NOTE — Telephone Encounter (Signed)
Patient informed that a new prescription was sent to [facility] order. Verbalized understanding.

## 2021-10-22 NOTE — Telephone Encounter (Signed)
Pt called in stating that she needs to get a new prescription sent to [facility] for glucometer. Pt states that her current monitor is not working properly. Please advise  Cb#: [phone]

## 2021-10-26 ENCOUNTER — Telehealth: Payer: Self-pay | Admitting: Family Medicine

## 2021-10-26 NOTE — Telephone Encounter (Signed)
Patient notified- offered to move up appointment to 10/27/21 but patient stated she will stick with her 10/28/21 appointment.

## 2021-10-26 NOTE — Telephone Encounter (Signed)
It is very difficult to treat this via phone.  It is best to treat this in person because there is so many variables to figure out.  She has an appointment on 3 1

## 2021-10-26 NOTE — Telephone Encounter (Signed)
Patient is having issues with hands,feet and legs swelling /cramping has appointment on 10/28/2021. I offer appointment on 2/28 at 8:20 or 1:30 she stated couldn't get here please advise. [employer]

## 2021-10-27 DIAGNOSIS — E785 Hyperlipidemia, unspecified: Secondary | ICD-10-CM

## 2021-10-27 DIAGNOSIS — I10 Essential (primary) hypertension: Secondary | ICD-10-CM | POA: Diagnosis not present

## 2021-10-27 DIAGNOSIS — E1140 Type 2 diabetes mellitus with diabetic neuropathy, unspecified: Secondary | ICD-10-CM

## 2021-10-27 NOTE — Progress Notes (Deleted)
? ? ? ? ?Primary Care Physician: [name], MD ? ?Primary Gastroenterologist:  [name], DO ? ? ?No chief complaint on file. ? ? ?HPI: [name] is a 76 y.o. female here for follow up. Last seen in office 02/2021. She has h/o GERD, IBS constipation, h/o colon polyps. No future colonoscopies for screening/surveillance purposes given age, last colonoscopy 2021.  ? ?On linzess for constipation.  Previously tried and failed Colace with and without MiraLAX, lactulose (bloating, inconsistent), Amitiza, multiple over-the-counter options.  Trulance was going to cost her $2000.  On pantoprazole for GERD. ? ? ? ?Current Outpatient Medications  ?Medication Sig Dispense Refill  ? acetaminophen (TYLENOL) 500 MG tablet Take 1,000 mg by mouth every 6 (six) hours as needed for mild pain, moderate pain or headache.     ? blood glucose meter kit and supplies KIT Dispense based on patient and insurance preference. Use once a day as directed 1 each 0  ? clopidogrel (PLAVIX) 75 MG tablet TAKE 1 TABLET EVERY DAY 30 tablet 0  ? diclofenac Sodium (VOLTAREN) 1 % GEL Apply 2 g topically 4 (four) times daily. 100 g 1  ? glucose blood (PRODIGY NO CODING BLOOD GLUC) test strip USE AS DIRECTED ONE TIME DAILY 100 strip 12  ? linaclotide (LINZESS) 145 MCG CAPS capsule Take 1 capsule (145 mcg total) by mouth daily before breakfast. 30 capsule 5  ? losartan (COZAAR) 50 MG tablet Take 1 tablet (50 mg total) by mouth daily. 90 tablet 3  ? Multiple Vitamins-Minerals (MULTIVITAMIN WITH MINERALS) tablet Take 1 tablet by mouth daily.    ? pantoprazole (PROTONIX) 40 MG tablet TAKE 1 TABLET EVERY DAY 90 tablet 0  ? pravastatin (PRAVACHOL) 40 MG tablet TAKE ONE TABLET PO EACH EVENING 90 tablet 1  ? ?No current facility-administered medications for this visit.  ? ? ?Allergies as of 10/28/2021 - Review Complete 10/05/2021  ?Allergen Reaction Noted  ? Ibuprofen  05/19/2021  ? Norvasc [amlodipine besylate] Other (See Comments)  11/21/2012  ? Penicillins Hives and Other (See Comments)   ? Advair diskus [fluticasone-salmeterol] Palpitations 11/21/2012  ? Metformin and related Other (See Comments) 09/28/2013  ? ? ?ROS: ? ?General: Negative for anorexia, weight loss, fever, chills, fatigue, weakness. ?ENT: Negative for hoarseness, difficulty swallowing , nasal congestion. ?CV: Negative for chest pain, angina, palpitations, dyspnea on exertion, peripheral edema.  ?Respiratory: Negative for dyspnea at rest, dyspnea on exertion, cough, sputum, wheezing.  ?GI: See history of present illness. ?GU:  Negative for dysuria, hematuria, urinary incontinence, urinary frequency, nocturnal urination.  ?Endo: Negative for unusual weight change.  ?  ?Physical Examination: ? ? There were no vitals taken for this visit. ? ?General: Well-nourished, well-developed in no acute distress.  ?Eyes: No icterus. ?Mouth: Oropharyngeal mucosa moist and pink , no lesions erythema or exudate. ?Lungs: Clear to auscultation bilaterally.  ?Heart: Regular rate and rhythm, no murmurs rubs or gallops.  ?Abdomen: Bowel sounds are normal, nontender, nondistended, no hepatosplenomegaly or masses, no abdominal bruits or hernia , no rebound or guarding.   ?Extremities: No lower extremity edema. No clubbing or deformities. ?Neuro: Alert and oriented x 4   ?Skin: Warm and dry, no jaundice.   ?Psych: Alert and cooperative, normal mood and affect. ? ?Labs:  ?Lab Results  ?Component Value Date  ? ALT 15 09/25/2021  ? AST 18 09/25/2021  ? ALKPHOS 71 09/25/2021  ? BILITOT 0.3 09/25/2021  ? ?Lab Results  ?Component Value Date  ? CREATININE 0.94  09/25/2021  ? BUN 20 09/25/2021  ? NA 142 09/25/2021  ? K 4.6 09/25/2021  ? CL 103 09/25/2021  ? CO2 27 09/25/2021  ? ?Lab Results  ?Component Value Date  ? HGBA1C 7.0 (H) 09/25/2021  ? ?Lab Results  ?Component Value Date  ? WBC 7.1 03/13/2021  ? HGB 12.9 03/13/2021  ? HCT 41.4 03/13/2021  ? MCV 82.8 03/13/2021  ? PLT 298 03/13/2021  ? ?  ?Imaging  Studies: ?No results found. ? ? ?Assessment: ? ? ? ? ?Plan: ? ?

## 2021-10-28 ENCOUNTER — Telehealth: Payer: Self-pay | Admitting: Family Medicine

## 2021-10-28 ENCOUNTER — Ambulatory Visit (INDEPENDENT_AMBULATORY_CARE_PROVIDER_SITE_OTHER): Payer: Medicare HMO | Admitting: Family Medicine

## 2021-10-28 ENCOUNTER — Other Ambulatory Visit: Payer: Self-pay

## 2021-10-28 ENCOUNTER — Ambulatory Visit: Payer: Medicare HMO | Admitting: Gastroenterology

## 2021-10-28 DIAGNOSIS — I10 Essential (primary) hypertension: Secondary | ICD-10-CM

## 2021-10-28 DIAGNOSIS — R001 Bradycardia, unspecified: Secondary | ICD-10-CM | POA: Diagnosis not present

## 2021-10-28 DIAGNOSIS — R252 Cramp and spasm: Secondary | ICD-10-CM

## 2021-10-28 MED ORDER — PANTOPRAZOLE SODIUM 40 MG PO TBEC
40.0000 mg | DELAYED_RELEASE_TABLET | Freq: Every day | ORAL | 1 refills | Status: DC
Start: 2021-10-28 — End: 2022-01-14

## 2021-10-28 MED ORDER — CLOPIDOGREL BISULFATE 75 MG PO TABS: 75.0000 mg | ORAL_TABLET | Freq: Every day | ORAL | 1 refills | Status: DC

## 2021-10-28 MED ORDER — PRAVASTATIN SODIUM 20 MG PO TABS: ORAL_TABLET | ORAL | 1 refills | Status: DC

## 2021-10-28 NOTE — Progress Notes (Signed)
? ?  Subjective:  ? ? Patient ID: [name], female    DOB: 1945-10-05, 76 y.o.   MRN: [id number] ? ?Diabetes ?She presents for her follow-up diabetic visit. She has type 2 diabetes mellitus. Risk factors for coronary artery disease include diabetes mellitus, dyslipidemia, hypertension and post-menopausal. Current diabetic treatment includes diet. She is compliant with treatment all of the time.  ? ?Patient states that the increase in her pravachol is making her sick and making her hair fall out- had no problems at lower dose of 20mg  ? ?Patient also having problems with severe cramps in her hands and feet ? ?Review of Systems ? ?   ?Objective:  ? Physical Exam ?General-in no acute distress ?Eyes-no discharge ?Lungs-respiratory rate normal, CTA ?CV-no murmurs,bradycardia ?Extremities skin warm dry no edema ?Neuro grossly normal ?Behavior normal, alert ? ? ? ? ?   ?Assessment & Plan:  ?1. Essential hypertension ?Blood pressure decent control continue current measures ?- Ambulatory referral to Cardiology ?- Basic metabolic panel ? ?2. Muscle cramps ?Check lab work because of the severe muscle cramps present over the past couple weeks ?- Basic metabolic panel ?- TSH ?- T4, free ?- Magnesium ? ?3. Bradycardia ?Needs follow-up with her cardiology EKG no acute changes ?- PR ELECTROCARDIOGRAM, COMPLETE ?- Ambulatory referral to Cardiology ?- Basic metabolic panel ?- TSH ?- T4, free ?- Magnesium ? ?Patient has morbid obesity she is trying to watch her diet trying to stay active ?She is running short on her Plavix and needs refills ?Takes her cholesterol medicine regular basis ?Recent lab work look good ?

## 2021-10-28 NOTE — Telephone Encounter (Signed)
Patient made aware of outstanding balance from previous dates of service dating back to 2015-2019. Patient states she doesn't have to pay anything and has always had insurance. Requesting for bills to be resubmitted to insurance.  ? Mcarthur Rossetti Medicare Id # [id number] ? ?Please advise at [phone]. ?

## 2021-10-29 LAB — BASIC METABOLIC PANEL
BUN/Creatinine Ratio: 17 (ref 12–28)
BUN: 18 mg/dL (ref 8–27)
CO2: 20 mmol/L (ref 20–29)
Calcium: 10.1 mg/dL (ref 8.7–10.3)
Chloride: 103 mmol/L (ref 96–106)
Creatinine, Ser: 1.03 mg/dL — ABNORMAL HIGH (ref 0.57–1.00)
Sodium: 144 mmol/L (ref 134–144)
eGFR: 57 mL/min/{1.73_m2} — ABNORMAL LOW (ref >59)

## 2021-10-29 LAB — T4, FREE: Free T4: 1.2 ng/dL (ref 0.82–1.77)

## 2021-10-29 LAB — TSH: TSH: 1.77 u[IU]/mL (ref 0.450–4.500)

## 2021-10-29 LAB — MAGNESIUM: Magnesium: 2.3 mg/dL (ref 1.6–2.3)

## 2021-10-30 ENCOUNTER — Other Ambulatory Visit: Payer: Self-pay

## 2021-10-30 DIAGNOSIS — R252 Cramp and spasm: Secondary | ICD-10-CM

## 2021-11-02 DIAGNOSIS — R252 Cramp and spasm: Secondary | ICD-10-CM | POA: Diagnosis not present

## 2021-11-03 LAB — POTASSIUM: Potassium: 4.9 mmol/L (ref 3.5–5.2)

## 2021-11-04 ENCOUNTER — Encounter: Payer: Self-pay | Admitting: Family Medicine

## 2021-11-14 ENCOUNTER — Other Ambulatory Visit: Payer: Self-pay | Admitting: Cardiology

## 2021-11-26 ENCOUNTER — Telehealth: Payer: Self-pay | Admitting: *Deleted

## 2021-11-26 NOTE — Telephone Encounter (Signed)
Patient called and was made aware of my findings. She still states she doesn't owe and she isn't going to pay that she doesn't just have money laying around for a bill from 8 years ago. She states she hasn't received any statements from [place] which she hasn't due to this outstanding balance is coming from bad debt. I have advised patient that I would notate her account.  ?

## 2021-11-26 NOTE — Telephone Encounter (Signed)
After reviewing patients account it looks as if she has an outstanding balance with [place] of $66.32. This is coming from [place] 11/13/13 $35.00 07/31/2015 $10, 12/29/2015 $10.00, 02/28/2017 $ 10.00 and 10/24/17 $1.32 insurance has already paid all that they are going to pay. The remainder balance is patients responsibility. ? ?I have left patient a vm asking her to return my call.  ? ?CB# [phone] ?

## 2021-11-26 NOTE — Chronic Care Management (AMB) (Signed)
?  Care Management  ? ?Note ? ?11/26/2021 ?Name: DENNY LAVE MRN: [id number] DOB: 1946-01-15 ? ?LOREDANA MEDELLIN is a 76 y.o. year old female who is a primary care patient of Luking, Elayne Snare, MD and is actively engaged with the care management team. I reached out to [name] by phone today to assist with scheduling an initial visit with the RN Case Manager ? ?Follow up plan: ?Unsuccessful telephone outreach attempt made. A HIPAA compliant phone message was left for the patient providing contact information and requesting a return call.  ?The care management team will reach out to the patient again over the next 7 days.  ?If patient returns call to provider office, please advise to call [place] at [phone] ?Laverda Sorenson  ?Care Guide, Embedded Care Coordination ?[place]  Care Management  ?Direct Dial: [phone] ? ? ?

## 2021-12-01 ENCOUNTER — Ambulatory Visit (INDEPENDENT_AMBULATORY_CARE_PROVIDER_SITE_OTHER): Payer: Medicare HMO | Admitting: Podiatry

## 2021-12-01 ENCOUNTER — Ambulatory Visit: Payer: Medicare HMO | Admitting: Podiatry

## 2021-12-01 DIAGNOSIS — M7662 Achilles tendinitis, left leg: Secondary | ICD-10-CM

## 2021-12-01 DIAGNOSIS — E1169 Type 2 diabetes mellitus with other specified complication: Secondary | ICD-10-CM

## 2021-12-01 DIAGNOSIS — E1142 Type 2 diabetes mellitus with diabetic polyneuropathy: Secondary | ICD-10-CM

## 2021-12-01 DIAGNOSIS — B351 Tinea unguium: Secondary | ICD-10-CM | POA: Diagnosis not present

## 2021-12-01 NOTE — Patient Instructions (Signed)
Look for [employer] shoes for a supportive sandal  ?

## 2021-12-03 NOTE — Chronic Care Management (AMB) (Signed)
?  Care Management  ? ?Note ? ?12/03/2021 ?Name: [name] MRN: [id number] DOB: July 20, 1946 ? ?[name] is a 76 y.o. year old female who is a primary care patient of Luking, Elayne Snare, MD and is actively engaged with the care management team. I reached out to [name] by phone today to assist with scheduling an initial visit with the RN Case Manager ? ?Follow up plan: ?Unsuccessful telephone outreach attempt made. A HIPAA compliant phone message was left for the patient providing contact information and requesting a return call.  ?The care management team will reach out to the patient again over the next 14 days.  ?If patient returns call to provider office, please advise to call [place] at [phone]. ? ?Laverda Sorenson  ?Care Guide, Embedded Care Coordination ?[place]  Care Management  ?Direct Dial: [phone] ? ?

## 2021-12-06 ENCOUNTER — Encounter: Payer: Self-pay | Admitting: Podiatry

## 2021-12-06 NOTE — Progress Notes (Signed)
?  Subjective:  ?Patient ID: [name], female    DOB: November 02, 1945,  MRN: [id number] ? ?Chief Complaint  ?Patient presents with  ? Tendonitis  ?   Tendonitis check up;left foot  ? Nail Problem  ?  Nail trim  ? ? ?76 y.o. female presents with the above complaint. History confirmed with patient.  Her Achilles tendon is doing much better of the nails are thickened and elongated again ? ?Objective:  ?Physical Exam: ?warm, good capillary refill, no trophic changes or ulcerative lesions, normal DP and PT pulses, normal monofilament exam, and normal sensory exam. ?Left Foot: dystrophic yellowed discolored nail plates with subungual debris ?Right Foot: dystrophic yellowed discolored nail plates with subungual debris ? ?Assessment:  ? ?1. Onychomycosis of multiple toenails with type 2 diabetes mellitus and peripheral neuropathy ([place])   ?2. Achilles tendinitis of left lower extremity   ? ? ? ?Plan:  ?Patient was evaluated and treated and all questions answered. ? ?Patient educated on diabetes. Discussed proper diabetic foot care and discussed risks and complications of disease. Educated patient in depth on reasons to return to the office immediately should he/she discover anything concerning or new on the feet. All questions answered. Discussed proper shoes as well.  ? ?Discussed the etiology and treatment options for the condition in detail with the patient. Educated patient on the topical and oral treatment options for mycotic nails. Recommended debridement of the nails today. Sharp and mechanical debridement performed of all painful and mycotic nails today. Nails debrided in length and thickness using a nail nipper to level of comfort. Discussed treatment options including appropriate shoe gear. Follow up as needed for painful nails. ? ? ?Return if symptoms worsen or fail to improve.  ? ?

## 2021-12-15 NOTE — Chronic Care Management (AMB) (Signed)
?  Care Management  ? ?Note ? ?12/15/2021 ?Name: [name] MRN: [id number] DOB: Aug 11, 1946 ? ?[name] is a 76 y.o. year old female who is a primary care patient of Luking, Elayne Snare, MD and is actively engaged with the care management team. I reached out to [name] by phone today to assist with scheduling an initial visit with the RN Case Manager ? ?Follow up plan: ?A third unsuccessful telephone outreach attempt made. A HIPAA compliant phone message was left for the patient providing contact information and requesting a return call.  ?Unable to make contact on outreach attempts x 3. PCP Dr Wolfgang Phoenix notified via routed documentation in medical record. We have been unable to make contact with the patient for follow up. The care management team is available to follow up with the patient after provider conversation with the patient regarding recommendation for care management engagement and subsequent re-referral to the care management team.  ? ?Laverda Sorenson  ?Care Guide, Embedded Care Coordination ?[place]  Care Management  ?Direct Dial: [phone] ? ?

## 2022-01-04 ENCOUNTER — Telehealth: Payer: Medicare HMO

## 2022-01-13 NOTE — Progress Notes (Signed)
Cardiology Office Note    Date:  01/14/2022   ID:  [name], DOB 09/09/1945, MRN [id number]  PCP:  [name], MD  Cardiologist: [name], MD    Chief Complaint  Patient presents with   Follow-up    Overdue Visit    History of Present Illness:    [name] is a 76 y.o. female with past medical history of chest pain (mild CAD by cath in 2005 and 2012 with low-risk NST's in 07/2016 and 05/2019), HTN, HLD and history of DVT who presents to the office today for overdue follow-up.  She was last examined by Dr. Harl Bowie in 02/2021 during admission for chest pain and bradycardia. She was noted to have intermittent episodes of junctional bradycardia with heart rate in the 40's but she was ambulating without symptoms. It was recommended that she follow-up with EP as an outpatient. Her chest pain was overall felt to be atypical for a cardiac etiology and troponin values were negative with EKG showing no acute ischemic changes. She did see Dr. Lovena Le in 03/2021 and was overall asymptomatic at that time, therefore watchful waiting was recommended.  In talking with the patient today, she reports overall doing well from a cardiac perspective since her last office visit. She enjoys going out with friends to the mall and denies any recent chest pain or dyspnea on exertion with routine activities. No recent orthopnea, PND or pitting edema. She has gained weight over the past few years and says she is planning to return to the [facility] to start back exercising and is also going to focus on dietary changes. She has been under more stress lately given the death of several family members and feels like this has been contributing to dietary indiscretion.   Past Medical History:  Diagnosis Date   Blood clot of artery under arm ([place])    Cyst on liver   Depression    Diabetes mellitus    controlled by diet   Diverticulitis    DVT of axillary vein, acute right ([place]) 11/24/2012    Dx on Jan 28, 2012.     Fibrocystic breast disease    Hepatic cyst    Hypertension    IBS (irritable bowel syndrome)    Impaired glucose tolerance    Menopause     Past Surgical History:  Procedure Laterality Date   ABDOMINAL HYSTERECTOMY     BACK SURGERY     BREAST SURGERY  right   cyst removed   CAST APPLICATION  32/20/2542   Procedure: CAST APPLICATION;  Surgeon: Carole Civil, MD;  Location: [facility];  Service: Orthopedics;  Laterality: Right;  procedure room    CHOLECYSTECTOMY     COLONOSCOPY  12/04/2004   NUR:Few small diverticula at sigmoid colon/Small cecal polyp ablated    COLONOSCOPY N/A 09/24/2014   SLF: small internal hemorrhoids   COLONOSCOPY N/A 02/15/2020   Procedure: COLONOSCOPY;  Surgeon: [name], MD;  Location: [facility];  Service: Endoscopy;  Laterality: N/A;  8:30am   ESOPHAGOGASTRODUODENOSCOPY N/A 02/02/2016   Procedure: ESOPHAGOGASTRODUODENOSCOPY (EGD);  Surgeon: Danie Binder, MD;  Location: [facility];  Service: Endoscopy;  Laterality: N/A;  930    EXTERNAL FIXATION ANKLE FRACTURE     KNEE ARTHROCENTESIS  left   POLYPECTOMY  02/15/2020   Procedure: POLYPECTOMY;  Surgeon: [name], MD;  Location: [facility];  Service: Endoscopy;;   TUBAL LIGATION      Current Medications:  Outpatient Medications Prior to Visit  Medication Sig Dispense Refill   acetaminophen (TYLENOL) 500 MG tablet Take 1,000 mg by mouth every 6 (six) hours as needed for mild pain, moderate pain or headache.      blood glucose meter kit and supplies KIT Dispense based on patient and insurance preference. Use once a day as directed 1 each 0   diclofenac Sodium (VOLTAREN) 1 % GEL Apply 2 g topically 4 (four) times daily. 100 g 1   glucose blood (PRODIGY NO CODING BLOOD GLUC) test strip USE AS DIRECTED ONE TIME DAILY 100 strip 12   linaclotide (LINZESS) 145 MCG CAPS capsule Take 1 capsule (145 mcg total) by mouth daily before breakfast. 30 capsule 5    losartan (COZAAR) 50 MG tablet Take 1 tablet (50 mg total) by mouth daily. 90 tablet 3   Multiple Vitamins-Minerals (MULTIVITAMIN WITH MINERALS) tablet Take 1 tablet by mouth daily.     pantoprazole (PROTONIX) 40 MG tablet TAKE 1 TABLET EVERY DAY 90 tablet 1   pravastatin (PRAVACHOL) 20 MG tablet TAKE 1 TABLET EVERY EVENING ( DOSE DECREASE ) 90 tablet 1   clopidogrel (PLAVIX) 75 MG tablet TAKE 1 TABLET EVERY DAY (NEED MD APPOINTMENT FOR REFILLS) 90 tablet 0   No facility-administered medications prior to visit.     Allergies:   Ibuprofen, Norvasc [amlodipine besylate], Penicillins, Advair diskus [fluticasone-salmeterol], and Metformin and related   Social History   Socioeconomic History   Marital status: Widowed    Spouse name: Not on file   Number of children: 2   Years of education: Not on file   Highest education level: Not on file  Occupational History   Occupation: Retired  Tobacco Use   Smoking status: Never   Smokeless tobacco: Never   Tobacco comments:    Never smoked  Vaping Use   Vaping Use: Never used  Substance and Sexual Activity   Alcohol use: No    Alcohol/week: 0.0 standard drinks   Drug use: No   Sexual activity: Yes  Other Topics Concern   Not on file  Social History Narrative   1 son and 1 daughter live close by.   Social Determinants of Health   Financial Resource Strain: Low Risk    Difficulty of Paying Living Expenses: Not hard at all  Food Insecurity: No Food Insecurity   Worried About Charity fundraiser in the Last Year: Never true   [place] in the Last Year: Never true  Transportation Needs: No Transportation Needs   Lack of Transportation (Medical): No   Lack of Transportation (Non-Medical): No  Physical Activity: Insufficiently Active   Days of Exercise per Week: 2 days   Minutes of Exercise per Session: 30 min  Stress: No Stress Concern Present   Feeling of Stress : Not at all  Social Connections: Moderately Integrated    Frequency of Communication with Friends and Family: More than three times a week   Frequency of Social Gatherings with Friends and Family: More than three times a week   Attends Religious Services: More than 4 times per year   Active Member of [employer] or Organizations: Yes   Attends Archivist Meetings: More than 4 times per year   Marital Status: Widowed     Family History:  The patient's family history includes Cancer in her sister; Diabetes in her brother and sister; Heart attack in her mother; Heart disease in her brother; Kidney disease in her son; Seizures  in her brother; Stroke in her mother.   Review of Systems:    Please see the history of present illness.     All other systems reviewed and are otherwise negative except as noted above.   Physical Exam:    VS:  BP 134/64   Pulse 67   Ht 5' 4"  (1.626 m)   Wt 246 lb 6.4 oz (111.8 kg)   SpO2 99%   BMI 42.29 kg/m    General: Pleasant obese female appearing in no acute distress. Head: Normocephalic, atraumatic. Neck: No carotid bruits. JVD not elevated.  Lungs: Respirations regular and unlabored, without wheezes or rales.  Heart: Regular rate and rhythm. No S3 or S4.  No murmur, no rubs, or gallops appreciated. Abdomen: Appears non-distended. No obvious abdominal masses. Msk:  Strength and tone appear normal for age. No obvious joint deformities or effusions. Extremities: No clubbing or cyanosis. No pitting edema.  Distal pedal pulses are 2+ bilaterally. Neuro: Alert and oriented X 3. Moves all extremities spontaneously. No focal deficits noted. Psych:  Responds to questions appropriately with a normal affect. Skin: No rashes or lesions noted  Wt Readings from Last 3 Encounters:  01/14/22 246 lb 6.4 oz (111.8 kg)  08/17/21 242 lb 6.4 oz (110 kg)  07/29/21 243 lb (110.2 kg)     Studies/Labs Reviewed:   EKG:  EKG is ordered today.  The ekg ordered today demonstrates NSR, HR 67 with PAC's and RBBB and LAFB.    Recent Labs: 03/13/2021: Hemoglobin 12.9; Platelets 298 09/25/2021: ALT 15 10/28/2021: BUN 18; Creatinine, Ser 1.03; Magnesium 2.3; Sodium 144; TSH 1.770 11/02/2021: Potassium 4.9   Lipid Panel    Component Value Date/Time   CHOL 154 09/25/2021 0830   TRIG 108 09/25/2021 0830   HDL 49 09/25/2021 0830   CHOLHDL 3.1 09/25/2021 0830   CHOLHDL 3.4 09/26/2014 1019   VLDL 17 09/26/2014 1019   LDLCALC 85 09/25/2021 0830    Additional studies/ records that were reviewed today include:   NST: 05/2019 There was no ST segment deviation noted during stress. RBBB seen throughout study. The study is normal. No myocardial ischemia or scar. This is a low risk study. Nuclear stress EF: 73%.  Event Monitor: 04/2021 7 day monitor Occasional supraventricular ectopy in the form of isolated PACs, rare couplets and triplets. Rare SVT episodes up to 18 beats Rare ventricular ectopy in the form of PVCs and couplets. Isolated accelerated idioventricular rhythm 12 beats 14 sinus pauses occurred, longest lasting 4.3 seconds. Pauses were nocturnal. No symptoms reported     Patch Wear Time:  6 days and 20 hours (2022-07-26T10:59:28-0400 to 2022-08-02T07:34:34-0400)   Patient had a min HR of 31 bpm, max HR of 169 bpm, and avg HR of 55 bpm. Predominant underlying rhythm was Sinus Rhythm. Bundle Branch Block/IVCD was present. 1 run of Ventricular Tachycardia occurred lasting 12 beats with a max rate of 126 bpm (avg 103  bpm). 8 Supraventricular Tachycardia runs occurred, the run with the fastest interval lasting 8 beats with a max rate of 169 bpm, the longest lasting 18 beats with an avg rate of 94 bpm. 14 Pauses occurred, the longest lasting 4.3 secs (14 bpm).  Junctional Rhythm was present. Isolated SVEs were occasional (2.9%, 15250), SVE Couplets were rare (<1.0%, 1071), and SVE Triplets were rare (<1.0%, 51). Isolated VEs were rare (<1.0%), VE Couplets were rare (<1.0%), and no VE Triplets were  present.  Assessment:    1. History of chest pain  2. Bradycardia   3. Essential hypertension   4. Hyperlipidemia LDL goal <70      Plan:   In order of problems listed above:  1. History of Chest Pain - She underwent work-up in the past with mild CAD by cath in 2005 and 2012 with low-risk NST's in 07/2016 and 05/2019. She remains active at baseline and denies any recent anginal symptoms. Continue with risk factor modification. She is on Plavix 75 mg daily (given prior intolerances to ASA) along with Pravastatin 20 mg daily. She is no longer on beta-blocker therapy due to bradycardia.  2.  Bradycardia - Noted during prior admission and monitor in 03/2021 showed predominantly normal sinus rhythm with 14 pauses with the longest being 4.3 seconds but pauses were nocturnal. EP recommended watchful waiting at that time. She denies any recent dizziness or presyncope. Continue to avoid AV nodal blocking agents given her baseline conduction delay on EKG tracings.   3. HTN - Her blood pressure was initially recorded at 162/58, rechecked and improved to 134/64. Continue current medical therapy with Losartan 50 mg daily. She has been consuming bacon daily for the past week and is planning to focus on dietary changes. Recommended reduction in sodium intake.  4. HLD - Followed by her PCP. FLP in 08/2021 showed total cholesterol 154, triglycerides 108, HDL 49 and LDL 85.  She remains on Pravastatin 20 mg daily as she was intolerant to higher-intensity statin therapy.   Medication Adjustments/Labs and Tests Ordered: Current medicines are reviewed at length with the patient today.  Concerns regarding medicines are outlined above.  Medication changes, Labs and Tests ordered today are listed in the Patient Instructions below. Patient Instructions  Medication Instructions:  Your physician recommends that you continue on your current medications as directed. Please refer to the Current Medication list  given to you today.   Labwork: None today  Testing/Procedures: None today  Follow-Up: 6 months  Any Other Special Instructions Will Be Listed Below (If Applicable).  If you need a refill on your cardiac medications before your next appointment, please call your pharmacy.    Signed, Erma Heritage, PA-C  01/14/2022 4:31 PM    [facility] [address] [place], [place] [address] Phone: [phone] Fax: [phone]

## 2022-01-14 ENCOUNTER — Encounter: Payer: Self-pay | Admitting: Student

## 2022-01-14 ENCOUNTER — Ambulatory Visit (INDEPENDENT_AMBULATORY_CARE_PROVIDER_SITE_OTHER): Payer: Medicare HMO | Admitting: Student

## 2022-01-14 ENCOUNTER — Other Ambulatory Visit: Payer: Self-pay | Admitting: Family Medicine

## 2022-01-14 VITALS — BP 134/64 | HR 67 | Ht 64.0 in | Wt 246.4 lb

## 2022-01-14 DIAGNOSIS — E785 Hyperlipidemia, unspecified: Secondary | ICD-10-CM | POA: Diagnosis not present

## 2022-01-14 DIAGNOSIS — R001 Bradycardia, unspecified: Secondary | ICD-10-CM | POA: Diagnosis not present

## 2022-01-14 DIAGNOSIS — I10 Essential (primary) hypertension: Secondary | ICD-10-CM

## 2022-01-14 DIAGNOSIS — Z87898 Personal history of other specified conditions: Secondary | ICD-10-CM

## 2022-01-14 MED ORDER — CLOPIDOGREL BISULFATE 75 MG PO TABS: ORAL_TABLET | ORAL | 3 refills | Status: DC

## 2022-01-14 NOTE — Patient Instructions (Signed)
Medication Instructions:  Your physician recommends that you continue on your current medications as directed. Please refer to the Current Medication list given to you today.   Labwork: None today  Testing/Procedures: None today  Follow-Up: 6 months  Any Other Special Instructions Will Be Listed Below (If Applicable).  If you need a refill on your cardiac medications before your next appointment, please call your pharmacy.  

## 2022-02-15 ENCOUNTER — Ambulatory Visit (HOSPITAL_COMMUNITY)
Admission: RE | Admit: 2022-02-15 | Discharge: 2022-02-15 | Disposition: A | Discharge status: Home / Self Care | Payer: Medicare HMO | Source: Ambulatory Visit | Attending: Family Medicine | Admitting: Family Medicine

## 2022-02-15 ENCOUNTER — Ambulatory Visit (INDEPENDENT_AMBULATORY_CARE_PROVIDER_SITE_OTHER): Payer: Medicare HMO | Admitting: Family Medicine

## 2022-02-15 VITALS — BP 130/78 | HR 63 | Temp 98.2°F

## 2022-02-15 DIAGNOSIS — S93402A Sprain of unspecified ligament of left ankle, initial encounter: Secondary | ICD-10-CM

## 2022-02-15 DIAGNOSIS — M25572 Pain in left ankle and joints of left foot: Secondary | ICD-10-CM | POA: Diagnosis not present

## 2022-02-15 NOTE — Progress Notes (Signed)
   Subjective:    Patient ID: [name], female    DOB: 02-Nov-1945, 76 y.o.   MRN: [id number]  HPI  Patient left ankle is has swelling and pain. She had surgery on left ankle about a year ago. Stepped out of a [place] [employer] felt her ankle pop complaining of pain discomfort with walking swelling up  Relates that same ankle that she had surgery on Review of Systems     Objective:   Physical Exam  Calf normal foot normal lateral malleolus and posterior malleolus tender swollen increased pain with inversion      Assessment & Plan:  Ankle sprain X-ray to rule out fracture Tylenol as needed Voltaren gel Follow-up if progressive troubles Await the x-ray

## 2022-02-16 ENCOUNTER — Telehealth: Payer: Self-pay

## 2022-02-16 NOTE — Telephone Encounter (Signed)
X-ray no fracture Shows some swelling due to the sprain Do gentle range of motion exercises that we talked about If not dramatically better within 2 weeks to notify us-if persistent trouble we can do physical therapy. Should gradually get better over the next 2 weeks Call if any issues

## 2022-02-16 NOTE — Telephone Encounter (Signed)
Caller name:Jamell Maricela Bo   On DPR? :Yes  Call back [phone]  Provider they see: Luk[place]   Reason for call:Pt is calling about x-ray from yesterday

## 2022-02-16 NOTE — Telephone Encounter (Signed)
Patient has been informed per the following ,   X-ray no fracture Shows some swelling due to the sprain Do gentle range of motion exercises that we talked about If not dramatically better within 2 weeks to notify us-if persistent trouble we can do physical therapy. Should gradually get better over the next 2 weeks Call if any issues

## 2022-03-05 ENCOUNTER — Ambulatory Visit: Payer: Medicare HMO | Admitting: Family Medicine

## 2022-03-10 ENCOUNTER — Ambulatory Visit (INDEPENDENT_AMBULATORY_CARE_PROVIDER_SITE_OTHER): Payer: Medicare HMO | Admitting: Family Medicine

## 2022-03-10 VITALS — BP 110/60 | HR 50 | Temp 97.3°F | Ht 64.0 in | Wt 246.2 lb

## 2022-03-10 DIAGNOSIS — E785 Hyperlipidemia, unspecified: Secondary | ICD-10-CM | POA: Diagnosis not present

## 2022-03-10 DIAGNOSIS — I952 Hypotension due to drugs: Secondary | ICD-10-CM | POA: Diagnosis not present

## 2022-03-10 DIAGNOSIS — I10 Essential (primary) hypertension: Secondary | ICD-10-CM | POA: Diagnosis not present

## 2022-03-10 DIAGNOSIS — M1712 Unilateral primary osteoarthritis, left knee: Secondary | ICD-10-CM

## 2022-03-10 DIAGNOSIS — K5900 Constipation, unspecified: Secondary | ICD-10-CM

## 2022-03-10 DIAGNOSIS — I495 Sick sinus syndrome: Secondary | ICD-10-CM | POA: Diagnosis not present

## 2022-03-10 DIAGNOSIS — Z13820 Encounter for screening for osteoporosis: Secondary | ICD-10-CM | POA: Diagnosis not present

## 2022-03-10 DIAGNOSIS — E1140 Type 2 diabetes mellitus with diabetic neuropathy, unspecified: Secondary | ICD-10-CM

## 2022-03-10 DIAGNOSIS — E1169 Type 2 diabetes mellitus with other specified complication: Secondary | ICD-10-CM

## 2022-03-10 MED ORDER — LOSARTAN POTASSIUM 25 MG PO TABS: 25.0000 mg | ORAL_TABLET | Freq: Every day | ORAL | 3 refills | Status: DC

## 2022-03-10 NOTE — Progress Notes (Signed)
   Subjective:    Patient ID: [name], female    DOB: [birth year], 76 y.o.   MRN: [id number]  HPI  Patient here for 3 month follow up. She says her blood sugar was 131 yesterday morning. We did discuss healthy eating Discussed monitoring her sugars closely Moods overall are doing okay  Patient says insurance will not pay for the Linzess Essential hypertension - Plan: Hemoglobin Q6P, Basic Metabolic Panel, Lipid Profile, Hepatic function panel  Type 2 diabetes mellitus with diabetic neuropathy, without long-term current use of insulin (HCC) - Plan: Hemoglobin Y1P, Basic Metabolic Panel, Lipid Profile, Hepatic function panel  Hyperlipidemia associated with type 2 diabetes mellitus ([place]) - Plan: Hemoglobin J0D, Basic Metabolic Panel, Lipid Profile, Hepatic function panel  Hypotension due to drugs - Plan: Hemoglobin T2I, Basic Metabolic Panel, Lipid Profile, Hepatic function panel  Primary osteoarthritis of left knee - Plan: Hemoglobin Z1I, Basic Metabolic Panel, Lipid Profile, Hepatic function panel  Constipation, unspecified constipation type - Plan: Hemoglobin W5Y, Basic Metabolic Panel, Lipid Profile, Hepatic function panel  Screening for osteoporosis - Plan: DG Bone Density  Review of Systems     Objective:   Physical Exam General-in no acute distress Eyes-no discharge Lungs-respiratory rate normal, CTA CV-no murmurs,RRR Extremities skin warm dry no edema Neuro grossly normal Behavior normal, alert  She does have right knee osteoarthritis left knee has medial aspect osteoarthritis it is giving her some troubles does not lock or give way but at times it feels weak to her       Assessment & Plan:   1. Essential hypertension Blood pressure good control currently watch diet, reduce her losartan from 50 mg to 25 mg because of low blood pressure-it did drop when she stood up if she has any ongoing troubles follow-up - Hemoglobin K9X - Basic Metabolic Panel -  Lipid Profile - Hepatic function panel  2. Type 2 diabetes mellitus with diabetic neuropathy, without long-term current use of insulin ([place]) Check lab work in the near future.  Watch diet closely stay physically active. - Hemoglobin I3J - Basic Metabolic Panel - Lipid Profile - Hepatic function panel  3. Hyperlipidemia associated with type 2 diabetes mellitus (HCC) Continue statin.  Watch diet stay active - Hemoglobin A2N - Basic Metabolic Panel - Lipid Profile - Hepatic function panel  4. Hypotension due to drugs This was related to her blood pressure medicine reducing should avoid the problem.  When she first came into the office blood pressure 94/60 - Hemoglobin K5L - Basic Metabolic Panel - Lipid Profile - Hepatic function panel  5. Primary osteoarthritis of left knee I recommend Voltaren gel if it keeps giving her trouble will refer to orthopedics for an injection do not feel the patient needs any type of surgery currently - Hemoglobin Z7Q - Basic Metabolic Panel - Lipid Profile - Hepatic function panel  6. Constipation, unspecified constipation type Unable to afford Linzess-we talked about the OTC measures to try to help her with her bowel movements - Hemoglobin B3A - Basic Metabolic Panel - Lipid Profile - Hepatic function panel  7. Screening for osteoporosis Bone density recommended - DG Bone Density Follow-up in approximately 4 months sooner if any problems  Patient does have a sinus node dysfunction heart rhythm regular at times irregular at others previous EKGs reviewed follow-up with cardiology as planned  Morbid obesity portion control regular physical activity was discussed in detail

## 2022-03-11 LAB — LIPID PANEL
Chol/HDL Ratio: 3 ratio (ref 0.0–4.4)
Cholesterol, Total: 152 mg/dL (ref 100–199)
HDL: 50 mg/dL (ref >39)
LDL Chol Calc (NIH): 83 mg/dL (ref 0–99)
Triglycerides: 104 mg/dL (ref 0–149)
VLDL Cholesterol Cal: 19 mg/dL (ref 5–40)

## 2022-03-11 LAB — BASIC METABOLIC PANEL
BUN/Creatinine Ratio: 20 (ref 12–28)
BUN: 20 mg/dL (ref 8–27)
CO2: 24 mmol/L (ref 20–29)
Calcium: 9.8 mg/dL (ref 8.7–10.3)
Chloride: 104 mmol/L (ref 96–106)
Creatinine, Ser: 0.99 mg/dL (ref 0.57–1.00)
Glucose: 141 mg/dL — ABNORMAL HIGH (ref 70–99)
Potassium: 5 mmol/L (ref 3.5–5.2)
Sodium: 143 mmol/L (ref 134–144)
eGFR: 60 mL/min/{1.73_m2} (ref >59)

## 2022-03-11 LAB — HEPATIC FUNCTION PANEL
ALT: 17 IU/L (ref 0–32)
AST: 19 IU/L (ref 0–40)
Albumin: 4.2 g/dL (ref 3.8–4.8)
Alkaline Phosphatase: 81 IU/L (ref 44–121)
Bilirubin Total: 0.4 mg/dL (ref 0.0–1.2)
Bilirubin, Direct: 0.1 mg/dL (ref 0.00–0.40)
Total Protein: 6.9 g/dL (ref 6.0–8.5)

## 2022-03-11 LAB — HEMOGLOBIN A1C
Est. average glucose Bld gHb Est-mCnc: 166 mg/dL
Hgb A1c MFr Bld: 7.4 % — ABNORMAL HIGH (ref 4.8–5.6)

## 2022-03-12 ENCOUNTER — Other Ambulatory Visit: Payer: Self-pay

## 2022-03-12 DIAGNOSIS — I10 Essential (primary) hypertension: Secondary | ICD-10-CM

## 2022-03-12 DIAGNOSIS — E1140 Type 2 diabetes mellitus with diabetic neuropathy, unspecified: Secondary | ICD-10-CM

## 2022-03-15 DIAGNOSIS — E119 Type 2 diabetes mellitus without complications: Secondary | ICD-10-CM | POA: Diagnosis not present

## 2022-03-15 DIAGNOSIS — Z0100 Encounter for examination of eyes and vision without abnormal findings: Secondary | ICD-10-CM | POA: Diagnosis not present

## 2022-03-15 DIAGNOSIS — H524 Presbyopia: Secondary | ICD-10-CM | POA: Diagnosis not present

## 2022-03-23 ENCOUNTER — Encounter: Payer: Self-pay | Admitting: Family Medicine

## 2022-03-23 ENCOUNTER — Ambulatory Visit (HOSPITAL_COMMUNITY)
Admission: RE | Admit: 2022-03-23 | Discharge: 2022-03-23 | Disposition: A | Discharge status: Home / Self Care | Payer: Medicare HMO | Source: Ambulatory Visit | Attending: Family Medicine | Admitting: Family Medicine

## 2022-03-23 DIAGNOSIS — Z13820 Encounter for screening for osteoporosis: Secondary | ICD-10-CM | POA: Diagnosis not present

## 2022-03-23 DIAGNOSIS — Z780 Asymptomatic menopausal state: Secondary | ICD-10-CM | POA: Insufficient documentation

## 2022-03-30 ENCOUNTER — Other Ambulatory Visit (HOSPITAL_COMMUNITY): Payer: Self-pay | Admitting: Family Medicine

## 2022-04-01 ENCOUNTER — Ambulatory Visit (INDEPENDENT_AMBULATORY_CARE_PROVIDER_SITE_OTHER): Payer: Medicare HMO | Admitting: Family Medicine

## 2022-04-01 ENCOUNTER — Encounter: Payer: Self-pay | Admitting: Family Medicine

## 2022-04-01 VITALS — BP 122/82 | Wt 247.2 lb

## 2022-04-01 DIAGNOSIS — N644 Mastodynia: Secondary | ICD-10-CM | POA: Diagnosis not present

## 2022-04-01 DIAGNOSIS — K5900 Constipation, unspecified: Secondary | ICD-10-CM

## 2022-04-01 NOTE — Progress Notes (Signed)
   Subjective:    Patient ID: [name], female    DOB: 06-19-1946, 76 y.o.   MRN: [id number]  HPI Pt arrives with left breast pain. Pt states this has been going on for a while. Pt states when the air hits the breast, its hurt. Pt states about 40 years ago she had a cyst removed from right breast.    Pt also reports issues with constipation. Pt having lower abdominal pain. Insurance doesn't want to pay for Linzess. Pt states when she eats, the pain increases.   Patient relates that her co-pay is fairly high at $170 for Linzess this makes it very difficult for her.  She is trying to look for other insurance that might cover it better.  I did let her know that it is not unusual for insurance companies to have a high co-pay for high technology medicine.  When she was using MiraLAX she was not utilizing a large amount.  Review of Systems     Objective:   Physical Exam General-in no acute distress Eyes-no discharge Lungs-respiratory rate normal, CTA CV-no murmurs,RRR Extremities skin warm dry no edema Neuro grossly normal Behavior normal, alert        Assessment & Plan:  1. Constipation, unspecified constipation type I recommend MiraLAX capful every single day in 8 ounces give [place] feedback within a couple weeks how that is doing the goal is to get a soft bowel movement on a more regular basis  2. Breast pain, left Diagnostic mammogram left side she is due for her regular mammogram as well  Has standard follow-up visit in the fall Diagnostic mammo ordered I do not find any evidence of breast cancer on physical exam

## 2022-04-01 NOTE — Patient Instructions (Signed)
Hi [name]  We will work hard setting you up for diagnostic mammogram.  This is to make sure there is not something more serious going on causing your breast pain we will let you know the results.  As for the constipation I recommend a capful of MiraLAX in 8 ounces of water on a daily basis if you do not see enough softening of your bowel movements over the next 2 weeks please let us know and we can try adjusting the dose.  Also glad that you are exercising please keep that up  We will see you at your visit in the fall [facility]-Dr. Nicki Reaper

## 2022-04-06 ENCOUNTER — Other Ambulatory Visit: Payer: Self-pay | Admitting: Family Medicine

## 2022-05-04 ENCOUNTER — Ambulatory Visit (HOSPITAL_COMMUNITY)
Admission: RE | Admit: 2022-05-04 | Discharge: 2022-05-04 | Disposition: A | Discharge status: Home / Self Care | Payer: Medicare HMO | Source: Ambulatory Visit | Attending: Family Medicine | Admitting: Family Medicine

## 2022-05-04 ENCOUNTER — Encounter (HOSPITAL_COMMUNITY): Payer: Self-pay

## 2022-05-04 ENCOUNTER — Ambulatory Visit (HOSPITAL_COMMUNITY): Admission: RE | Admit: 2022-05-04 | Payer: Medicare HMO | Source: Ambulatory Visit

## 2022-05-04 DIAGNOSIS — N644 Mastodynia: Secondary | ICD-10-CM | POA: Insufficient documentation

## 2022-06-30 ENCOUNTER — Encounter: Payer: Self-pay | Admitting: Student

## 2022-06-30 ENCOUNTER — Ambulatory Visit: Payer: Medicare HMO | Attending: Student | Admitting: Student

## 2022-06-30 VITALS — BP 132/74 | HR 67 | Ht 64.0 in | Wt 245.8 lb

## 2022-06-30 DIAGNOSIS — I10 Essential (primary) hypertension: Secondary | ICD-10-CM

## 2022-06-30 DIAGNOSIS — Z87898 Personal history of other specified conditions: Secondary | ICD-10-CM

## 2022-06-30 DIAGNOSIS — E785 Hyperlipidemia, unspecified: Secondary | ICD-10-CM

## 2022-06-30 DIAGNOSIS — R001 Bradycardia, unspecified: Secondary | ICD-10-CM

## 2022-06-30 NOTE — Progress Notes (Signed)
Cardiology Office Note    Date:  06/30/2022   ID:  [name], DOB Jan 11, 1946, MRN [id number]  PCP:  [name], MD  Cardiologist: [name], MD    Chief Complaint  Patient presents with   Follow-up    6 month visit    History of Present Illness:    [name] is a 76 y.o. female past medical history of chest pain (mild CAD by cath in 2005 and 2012 with low-risk NST's in 07/2016 and 05/2019), baseline bradycardia, HTN, HLD and history of DVT who presents to the office today for 97-monthfollow-up.  She was last examined by myself in 12/2021 and denied any recent anginal symptoms at that time. [facility]. She was continued on her current cardiac medications including Plavix 75 mg daily (intolerant to ASA) and Pravastatin 20 mg daily. She was no longer on beta-blocker therapy given prior pauses by her event monitor (previously evaluated by EP with watchful waiting recommended given no symptoms).  In talking with the patient today, she reports she has been under increased stress over the past few months as her son was in the hospital and started on dialysis and her niece also recently passed away. Prior to this[facility] and was feeling well with this. Since she stopped exercising, she has noticed more fatigue and is hopeful to return to the gym next week. She denies any recent exertional chest pain or progressive dyspnea. She does have fibrotic breast tissue which is worse with temperature changes. Reports recent mammograms were reassuring. She denies any recent orthopnea, PND or pitting edema.   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 mild CAD by cath in 2005 and 2012 b. low-risk NST's in 07/2016 and 05/2019   Cyst on liver   Depression    Diabetes mellitus    controlled by  diet   Diverticulitis    DV[place], [place] right [place]   Dx on Jan 28, 2012.     Fibrocystic breast disease    Hepatic cyst    Hypertension    IBS (irritable bowel syndrome)    Impaired glucose tolerance    Menopause     Past Surgical History:  Procedure Laterality Date   ABDOMINAL HYSTERECTOMY     BACK SURGERY     BREAST SURGERY  right   cyst removed   CAST APPLICATION  020/81/3887  Procedure: CAST APPLICATION;  Surgeon: SCarole Civil MD;  Location: [facility];  Service: Orthopedics;  Laterality: Right;  procedure room    CHOLECYSTECTOMY     COLONOSCOPY  12/04/2004   NUR:Few small diverticula at sigmoid colon/Small cecal polyp ablated    COLONOSCOPY N/A 09/24/2014   SLF: small internal hemorrhoids   COLONOSCOPY N/A 02/15/2020   Procedure: COLONOSCOPY;  Surgeon: RDaneil Dolin MD;  [facility];  Service: Endoscopy;  Laterality: N/A;  8:30am   ESOPHAGOGASTRODUODENOSCOPY N/A 02/02/2016   Procedure: ESOPHAGOGASTRODUODENOSCOPY (EGD);  Surgeon: SDanie Binder MD;  [facility];  Service: Endoscopy;  Laterality: N/A;  930    EXTERNAL FIXATION ANKLE FRACTURE     KNEE ARTHROCENTESIS  left   POLYPECTOMY  02/15/2020   Procedure: POLYPECTOMY;  Surgeon: RDaneil Dolin MD;  [facility];  Service: Endoscopy;;   TUBAL LIGATION      Current Medications: Outpatient Medications Prior to Visit  Medication Sig Dispense Refill   acetaminophen (TYLENOL) 500 MG tablet Take 1,000 mg by mouth every 6 (six) hours as needed for mild pain, moderate pain or headache.      blood glucose meter kit and supplies KIT Dispense based on patient and insurance preference. Use once a day as directed 1 each 0   clopidogrel (PLAVIX) 75 MG tablet TAKE 1 TABLET EVERY DAY 90 tablet 3   diclofenac Sodium (VOLTAREN) 1 % GEL Apply 2 g topically 4 (four) times daily. 100 g 1   linaclotide (LINZESS) 145 MCG CAPS capsule Take 1 capsule (145 mcg total) by mouth  daily before breakfast. 30 capsule 5   losartan (COZAAR) 25 MG tablet Take 1 tablet (25 mg total) by mouth daily. 90 tablet 3   Multiple Vitamins-Minerals (MULTIVITAMIN WITH MINERALS) tablet Take 1 tablet by mouth daily.     pantoprazole (PROTONIX) 40 MG tablet TAKE 1 TABLET EVERY DAY 90 tablet 1   pravastatin (PRAVACHOL) 20 MG tablet TAKE 1 TABLET EVERY EVENING ( DOSE DECREASE ) 90 tablet 1   PRODIGY NO CODING BLOOD GLUC test strip USE AS DIRECTED ONE TIME DAILY 100 strip 12   clopidogrel (PLAVIX) 75 MG tablet Take by mouth.     No facility-administered medications prior to visit.     Allergies:   Atorvastatin, Ibuprofen, Norvasc [amlodipine besylate], Penicillins, Advair diskus [fluticasone-salmeterol], and Metformin and related   Social History   Socioeconomic History   Marital status: Widowed    Spouse name: Not on file   Number of children: 2   Years of education: Not on file   Highest education level: Not on file  Occupational History   Occupation: Retired  Tobacco Use   Smoking status: Never   Smokeless tobacco: Never   Tobacco comments:    Never smoked  Vaping Use   Vaping Use: Never used  Substance and Sexual Activity   Alcohol use: No    Alcohol/week: 0.0 standard drinks of alcohol   Drug use: No   Sexual activity: Yes  Other Topics Concern   Not on file  Social History Narrative   1 son and 1 daughter live close by.   Social Determinants of Health   Financial Resource Strain: Low Risk  (06/02/2021)   Overall Financial Resource Strain (CARDIA)    Difficulty of Paying Living Expenses: Not hard at all  Food Insecurity: No Food Insecurity (06/02/2021)   Hunger Vital Sign    Worried About Running Out of Food in the Last Year: Never true    Ran Out of Food in the Last Year: Never true  Transportation Needs: No Transportation Needs (06/02/2021)   PRAPARE - Hydrologist (Medical): No    Lack of Transportation (Non-Medical): No  Physical  Activity: Insufficiently Active (06/02/2021)   Exercise Vital Sign    Days of Exercise per Week: 2 days    Minutes of Exercise per Session: 30 min  Stress: No Stress Concern Present (06/02/2021)   [place]    Feeling of Stress : Not at all  Social Connections: Moderately Integrated (06/02/2021)   Social Connection and Isolation Panel [NHANES]    Frequency of Communication with Friends and Family: More than three times a week    Frequency of Social Gatherings with Friends and  Family: More than three times a week    Attends Religious Services: More than 4 times per year    Active Member of Clubs or Organizations: Yes    Attends Archivist Meetings: More than 4 times per year    Marital Status: Widowed     Family History:  The patient's family history includes Cancer in her sister; Diabetes in her brother and sister; Heart attack in her mother; Heart disease in her brother; Kidney disease in her son; Seizures in her brother; Stroke in her mother.   Review of Systems:    Please see the history of present illness.     All other systems reviewed and are otherwise negative except as noted above.   Physical Exam:    VS:  BP 132/74   Pulse 67   Ht _0  (1.626 m)   Wt 245 lb 12.8 oz (111.5 kg)   SpO2 97%   BMI 42.19 kg/m    General: Pleasant female appearing in no acute distress. Head: Normocephalic, atraumatic. Neck: No carotid bruits. JVD not elevated.  Lungs: Respirations regular and unlabored, without wheezes or rales.  Heart: Regular rate and rhythm. No S3 or S4.  No murmur, no rubs, or gallops appreciated. Abdomen: Appears non-distended. No obvious abdominal masses. Msk:  Strength and tone appear normal for age. No obvious joint deformities or effusions. Extremities: No clubbing or cyanosis. No pitting edema.  Distal pedal pulses are 2+ bilaterally. Neuro: Alert and oriented X 3. Moves all extremities  spontaneously. No focal deficits noted. Psych:  Responds to questions appropriately with a normal affect. Skin: No rashes or lesions noted  Wt Readings from Last 3 Encounters:  06/30/22 245 lb 12.8 oz (111.5 kg)  04/01/22 247 lb 3.2 oz (112.1 kg)  03/10/22 246 lb 3.2 oz (111.7 kg)     Studies/Labs Reviewed:   EKG:  EKG is not ordered today.   Recent Labs: 10/28/2021: Magnesium 2.3; TSH 1.770 03/10/2022: ALT 17; BUN 20; Creatinine, Ser 0.99; Potassium 5.0; Sodium 143   Lipid Panel    Component Value Date/Time   CHOL 152 03/10/2022 1038   TRIG 104 03/10/2022 1038   HDL 50 03/10/2022 1038   CHOLHDL 3.0 03/10/2022 1038   CHOLHDL 3.4 09/26/2014 1019   VLDL 17 09/26/2014 1019   LDLCALC 83 03/10/2022 1038    Additional studies/ records that were reviewed today include:   NST: 05/2019 There was no ST segment deviation noted during stress. The study is normal. No ischemia or infarction. This is a low risk study. Nuclear stress EF: 75%.   Event Monitor: 04/2021 7 day monitor Occasional supraventricular ectopy in the form of isolated PACs, rare couplets and triplets. Rare SVT episodes up to 18 beats Rare ventricular ectopy in the form of PVCs and couplets. Isolated accelerated idioventricular rhythm 12 beats 14 sinus pauses occurred, longest lasting 4.3 seconds. Pauses were nocturnal. No symptoms reported    Assessment:    1. History of chest pain   2. Essential hypertension   3. Hyperlipidemia LDL goal <70   4. Bradycardia      Plan:   In order of problems listed above:  1. History of Chest Pain/CAD - She had mild coronary artery disease by prior catheterizations in 2005 and 2012 with most recent ischemic evaluation being a low-risk NST in 05/2019. She has pain along her breasts in the setting of fibrotic tissue but denies any recent anginal symptoms. - Continue current medical therapy with Plavix 75  mg daily (previously intolerant to ASA), Losartan 25 mg daily and  Pravastatin 20 mg daily. No longer on beta-blocker therapy due to bradycardia.  2. HTN - Her BP was initially recorded at 140/70, rechecked and at 132/74. Continue current medical therapy with Losartan 25 mg daily. She does plan to start exercising again which should also help with her readings.  3. HLD - FLP in 02/2022 showed total cholesterol 152, triglycerides 104, HDL 50 and LDL 83. She remains on Pravastatin 20 mg daily as she previously had myalgias with higher dosing and had GI issues with higher-intensity statins.   4. Bradycardia - Heart rate is in the 60's today and she denies any recent symptoms. She was previously evaluated by EP with watchful waiting recommended given no symptoms. Continue to avoid AV nodal blocking agents.   Medication Adjustments/Labs and Tests Ordered: Current medicines are reviewed at length with the patient today.  Concerns regarding medicines are outlined above.  Medication changes, Labs and Tests ordered today are listed in the Patient Instructions below. Patient Instructions  Medication Instructions:  Your physician recommends that you continue on your current medications as directed. Please refer to the Current Medication list given to you today.  *If you need a refill on your cardiac medications before your next appointment, please call your pharmacy*   Lab Work: NONE   If you have labs (blood work) drawn today and your tests are completely normal, you will receive your results only by: [place] (if you have MyChart) OR A paper copy in the mail If you have any lab test that is abnormal or we need to change your treatment, we will call you to review the results.   Testing/Procedures: NONE    Follow-Up: At [facility], you and your health needs are our priority.  As part of our continuing mission to provide you with exceptional heart care, we have created designated Provider Care Teams.  These Care Teams include your primary  Cardiologist (physician) and Advanced Practice Providers (APPs -  Physician Assistants and Nurse Practitioners) who all work together to provide you with the care you need, when you need it.  We recommend signing up for the patient portal called "MyChart".  Sign up information is provided on this After Visit Summary.  MyChart is used to connect with patients for Virtual Visits (Telemedicine).  Patients are able to view lab/test results, encounter notes, upcoming appointments, etc.  Non-urgent messages can be sent to your provider as well.   To learn more about what you can do with MyChart, go to [email or web address].    Your next appointment:   6 month(s)  The format for your next appointment:   In Person  Provider:   [name], MD or Bernerd Pho, PA-C    Other Instructions Thank you for choosing [facility]!    Important Information About Sugar         Signed, Erma Heritage, PA-C  06/30/2022 2:48 PM    [place] [address] [place],  [address] Phone: [phone] Fax: [phone]

## 2022-06-30 NOTE — Patient Instructions (Signed)
Medication Instructions:  Your physician recommends that you continue on your current medications as directed. Please refer to the Current Medication list given to you today.  *If you need a refill on your cardiac medications before your next appointment, please call your pharmacy*   Lab Work: NONE   If you have labs (blood work) drawn today and your tests are completely normal, you will receive your results only by: [place] (if you have MyChart) OR A paper copy in the mail If you have any lab test that is abnormal or we need to change your treatment, we will call you to review the results.   Testing/Procedures: NONE    Follow-Up: At [facility], you and your health needs are our priority.  As part of our continuing mission to provide you with exceptional heart care, we have created designated Provider Care Teams.  These Care Teams include your primary Cardiologist (physician) and Advanced Practice Providers (APPs -  Physician Assistants and Nurse Practitioners) who all work together to provide you with the care you need, when you need it.  We recommend signing up for the patient portal called "MyChart".  Sign up information is provided on this After Visit Summary.  MyChart is used to connect with patients for Virtual Visits (Telemedicine).  Patients are able to view lab/test results, encounter notes, upcoming appointments, etc.  Non-urgent messages can be sent to your provider as well.   To learn more about what you can do with MyChart, go to [email or web address].    Your next appointment:   6 month(s)  The format for your next appointment:   In Person  Provider:   [name], MD or Bernerd Pho, PA-C    Other Instructions Thank you for choosing [facility]!    Important Information About Sugar

## 2022-07-05 ENCOUNTER — Other Ambulatory Visit: Payer: Self-pay | Admitting: Family Medicine

## 2022-07-05 NOTE — Progress Notes (Unsigned)
GI Office Note    Referring Provider: [name], MD Primary Care Physician:  [name], MD  Primary Gastroenterologist: [name], DO   Chief Complaint   No chief complaint on file.   History of Present Illness   [name] is a 76 y.o. female presenting today for ***stomach pain. Last seen in 02/2021. H/o GERD, IBS-C, h/o colon polyps.   Colonoscopy June 2021: Single 4 mm polyp in the cecum, tubular adenoma removed.  Diverticulosis in the entire examined colon.  Nonbleeding internal hemorrhoids.  Recommended no future colonoscopies unless new symptoms develop.   Previously tried and failed Colace with without MiraLAX, lactulose (bloating, inconsistent), Amitiza, multiple over-the-counter options.  Trulance would cost $2000.           Medications   Current Outpatient Medications  Medication Sig Dispense Refill   acetaminophen (TYLENOL) 500 MG tablet Take 1,000 mg by mouth every 6 (six) hours as needed for mild pain, moderate pain or headache.      blood glucose meter kit and supplies KIT Dispense based on patient and insurance preference. Use once a day as directed 1 each 0   clopidogrel (PLAVIX) 75 MG tablet TAKE 1 TABLET EVERY DAY 90 tablet 3   diclofenac Sodium (VOLTAREN) 1 % GEL Apply 2 g topically 4 (four) times daily. 100 g 1   linaclotide (LINZESS) 145 MCG CAPS capsule Take 1 capsule (145 mcg total) by mouth daily before breakfast. 30 capsule 5   losartan (COZAAR) 25 MG tablet Take 1 tablet (25 mg total) by mouth daily. 90 tablet 3   Multiple Vitamins-Minerals (MULTIVITAMIN WITH MINERALS) tablet Take 1 tablet by mouth daily.     pantoprazole (PROTONIX) 40 MG tablet TAKE 1 TABLET EVERY DAY 90 tablet 1   pravastatin (PRAVACHOL) 20 MG tablet TAKE 1 TABLET EVERY EVENING ( DOSE DECREASE ) 90 tablet 1   PRODIGY NO CODING BLOOD GLUC test strip USE AS DIRECTED ONE TIME DAILY 100 strip 12   No current facility-administered medications  for this visit.    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 mild CAD by cath in 2005 and 2012 b. low-risk NST's in 07/2016 and 05/2019   Cyst on liver   Depression    Diabetes mellitus    controlled by diet   Diverticulitis    DVT of axillary vein, acute right ([place]) 11/24/2012   Dx on Jan 28, 2012.     Fibrocystic breast disease    Hepatic cyst    Hypertension    IBS (irritable bowel syndrome)    Impaired glucose tolerance    Menopause     Past Surgical History   Past Surgical History:  Procedure Laterality Date   ABDOMINAL HYSTERECTOMY     BACK SURGERY     BREAST SURGERY  right   cyst removed   CAST APPLICATION  90/30/0923   Procedure: CAST APPLICATION;  Surgeon: Carole Civil, MD;  Location: [facility];  Service:  Orthopedics;  Laterality: Right;  procedure room    CHOLECYSTECTOMY     COLONOSCOPY  12/04/2004   NUR:Few small diverticula at sigmoid colon/Small cecal polyp ablated    COLONOSCOPY N/A 09/24/2014   SLF: small internal hemorrhoids   COLONOSCOPY N/A 02/15/2020   Procedure: COLONOSCOPY;  Surgeon: [name], MD;  [facility];  Service: Endoscopy;  Laterality: N/A;  8:30am   ESOPHAGOGASTRODUODENOSCOPY N/A 02/02/2016   Procedure: ESOPHAGOGASTRODUODENOSCOPY (EGD);  Surgeon: Danie Binder, MD;  [facility];  Service: Endoscopy;  Laterality: N/A;  930    EXTERNAL FIXATION ANKLE FRACTURE     KNEE ARTHROCENTESIS  left   POLYPECTOMY  02/15/2020   Procedure: POLYPECTOMY;   Surgeon: [name], MD;  [facility];  Service: Endoscopy;;   TUBAL LIGATION      Past Family History   Family History  Problem Relation Age of Onset   Stroke Mother    Heart attack Mother    Cancer Sister    Diabetes Sister    Seizures Brother    Diabetes Brother    Heart disease Brother    Kidney disease Son    Colon cancer Neg Hx     Past Social History   Social History   Socioeconomic History   Marital status: Widowed    Spouse name: Not on file   Number of children: 2   Years of education: Not on file   Highest education level: Not on file  Occupational History   Occupation: Retired  Tobacco Use   Smoking status: Never   Smokeless tobacco: Never   Tobacco comments:    Never smoked  Vaping Use   Vaping Use: Never used  Substance and Sexual Activity   Alcohol use: No    Alcohol/week: 0.0 standard drinks of alcohol   Drug use: No   Sexual activity: Yes  Other Topics Concern   Not on file  Social History Narrative   1 son and 1 daughter live close by.   Social Determinants of Health   Financial Resource Strain: Low Risk  (06/02/2021)   Overall Financial Resource Strain (CARDIA)    Difficulty of Paying Living Expenses: Not hard at all  Food Insecurity: No Food Insecurity (06/02/2021)   Hunger Vital Sign    Worried About Running Out of Food in the Last Year: Never true    Ran Out of Food in the Last Year: Never true  Transportation Needs: No Transportation Needs (06/02/2021)   PRAPARE - Hydrologist (Medical): No    Lack of Transportation (Non-Medical): No  Physical Activity: Insufficiently Active (06/02/2021)   Exercise Vital Sign    Days of Exercise per Week: 2 days    Minutes of Exercise per Session: 30 min  Stress: No Stress Concern Present (06/02/2021)   [place]    Feeling of Stress : Not at all  Social Connections: Moderately Integrated  (06/02/2021)   Social Connection and Isolation Panel [NHANES]    Frequency of Communication with Friends and Family: More than three times a week    Frequency of Social Gatherings with Friends and Family: More than three times a week    Attends Religious Services: More than 4 times per year    Active Member of [employer] or Organizations: Yes    Attends Archivist Meetings: More than 4 times per year    Marital Status: Widowed  Intimate Partner Violence: Not At Risk (06/02/2021)   Humiliation, Afraid, Rape, and Kick questionnaire    Fear of Current or Ex-Partner: No    Emotionally Abused: No    Physically Abused: No    Sexually Abused: No    Review of Systems   General: Negative for anorexia, weight loss, fever, chills, fatigue, weakness. ENT: Negative for hoarseness, difficulty swallowing , nasal congestion. CV: Negative for chest pain, angina, palpitations, dyspnea on exertion, peripheral edema.  Respiratory: Negative for dyspnea at rest, dyspnea on exertion, cough, sputum, wheezing.  GI: See history of present illness. GU:  Negative for dysuria, hematuria, urinary incontinence, urinary frequency, nocturnal urination.  Endo: Negative for unusual weight change.     Physical Exam   There were no vitals taken for this visit.   General: Well-nourished, well-developed in no acute distress.  Eyes: No icterus. Mouth: Oropharyngeal mucosa moist and pink , no lesions erythema or exudate. Lungs: Clear to auscultation bilaterally.  Heart: Regular rate and rhythm, no murmurs rubs or gallops.  Abdomen: Bowel sounds are normal, nontender, nondistended, no hepatosplenomegaly or masses,  no abdominal bruits or hernia , no rebound or guarding.  Rectal: ***  Extremities: No lower extremity edema. No clubbing or deformities. Neuro: Alert and oriented x 4   Skin: Warm and dry, no jaundice.   Psych: Alert and cooperative, normal mood and affect.  Labs   Lab Results  Component Value Date    CREATININE 0.99 03/10/2022   BUN 20 03/10/2022   NA 143 03/10/2022   K 5.0 03/10/2022   CL 104 03/10/2022   CO2 24 03/10/2022   Lab Results  Component Value Date   ALT 17 03/10/2022   AST 19 03/10/2022   ALKPHOS 81 03/10/2022   BILITOT 0.4 03/10/2022   Lab Results  Component Value Date   WBC 7.1 03/13/2021   HGB 12.9 03/13/2021   HCT 41.4 03/13/2021   MCV 82.8 03/13/2021   PLT 298 03/13/2021   Lab Results  Component Value Date   HGBA1C 7.4 (H) 03/10/2022    Imaging Studies   No results found.  Assessment       PLAN   ***   Laureen Ochs. Bobby Rumpf, [place], [facility]

## 2022-07-06 ENCOUNTER — Encounter: Payer: Self-pay | Admitting: Gastroenterology

## 2022-07-06 ENCOUNTER — Ambulatory Visit (INDEPENDENT_AMBULATORY_CARE_PROVIDER_SITE_OTHER): Payer: Medicare HMO | Admitting: Gastroenterology

## 2022-07-06 ENCOUNTER — Ambulatory Visit: Payer: Medicare HMO | Admitting: Gastroenterology

## 2022-07-06 VITALS — BP 147/70 | HR 62 | Temp 97.7°F | Ht 64.0 in | Wt 247.2 lb

## 2022-07-06 DIAGNOSIS — K5900 Constipation, unspecified: Secondary | ICD-10-CM

## 2022-07-06 DIAGNOSIS — K219 Gastro-esophageal reflux disease without esophagitis: Secondary | ICD-10-CM | POA: Diagnosis not present

## 2022-07-06 MED ORDER — LINACLOTIDE 145 MCG PO CAPS: 145.0000 ug | ORAL_CAPSULE | Freq: Every day | ORAL | 5 refills | Status: DC

## 2022-07-06 NOTE — Patient Instructions (Signed)
Samples of Linzess provided. Take one daily until you feel like you have a good results, then you can take one daily as needed. Add Benefiber 2 teaspoons daily for 2 weeks, then increase to 2 teaspoons twice daily. Call if your bowel movements don't improve or you still have abdominal pain. Return to the office in 3 months.   Constipation, Adult Constipation is when a person has fewer than three bowel movements in a week, has difficulty having a bowel movement, or has stools (feces) that are dry, hard, or larger than normal. Constipation may be caused by an underlying condition. It may become worse with age if a person takes certain medicines and does not take in enough fluids. Follow these instructions at home: Eating and drinking  Eat foods that have a lot of fiber, such as beans, whole grains, and fresh fruits and vegetables. Limit foods that are low in fiber and high in fat and processed sugars, such as fried or sweet foods. These include french fries, hamburgers, cookies, candies, and soda. Drink enough fluid to keep your urine pale yellow. General instructions Exercise regularly or as told by your health care provider. Try to do 150 minutes of moderate exercise each week. Use the bathroom when you have the urge to go. Do not hold it in. Take over-the-counter and prescription medicines only as told by your health care provider. This includes any fiber supplements. During bowel movements: Practice deep breathing while relaxing the lower abdomen. Practice pelvic floor relaxation. Watch your condition for any changes. Let your health care provider know about them. Keep all follow-up visits as told by your health care provider. This is important. Contact a health care provider if: You have pain that gets worse. You have a fever. You do not have a bowel movement after 4 days. You vomit. You are not hungry or you lose weight. You are bleeding from the opening between the buttocks  (anus). You have thin, pencil-like stools. Get help right away if: You have a fever and your symptoms suddenly get worse. You leak stool or have blood in your stool. Your abdomen is bloated. You have severe pain in your abdomen. You feel dizzy or you faint. Summary Constipation is when a person has fewer than three bowel movements in a week, has difficulty having a bowel movement, or has stools (feces) that are dry, hard, or larger than normal. Eat foods that have a lot of fiber, such as beans, whole grains, and fresh fruits and vegetables. Drink enough fluid to keep your urine pale yellow. Take over-the-counter and prescription medicines only as told by your health care provider. This includes any fiber supplements. This information is not intended to replace advice given to you by your health care provider. Make sure you discuss any questions you have with your health care provider. Document Revised: 07/04/2019 Document Reviewed: 07/04/2019 Elsevier Patient Education  [place].

## 2022-07-07 ENCOUNTER — Ambulatory Visit: Payer: Medicare HMO | Admitting: Gastroenterology

## 2022-07-12 ENCOUNTER — Encounter: Payer: Self-pay | Admitting: Family Medicine

## 2022-07-12 ENCOUNTER — Ambulatory Visit (INDEPENDENT_AMBULATORY_CARE_PROVIDER_SITE_OTHER): Payer: Medicare HMO | Admitting: Family Medicine

## 2022-07-12 VITALS — BP 130/72 | HR 39 | Temp 98.1°F | Ht 64.0 in | Wt 244.0 lb

## 2022-07-12 DIAGNOSIS — E785 Hyperlipidemia, unspecified: Secondary | ICD-10-CM | POA: Diagnosis not present

## 2022-07-12 DIAGNOSIS — F439 Reaction to severe stress, unspecified: Secondary | ICD-10-CM | POA: Diagnosis not present

## 2022-07-12 DIAGNOSIS — E1169 Type 2 diabetes mellitus with other specified complication: Secondary | ICD-10-CM

## 2022-07-12 DIAGNOSIS — Z23 Encounter for immunization: Secondary | ICD-10-CM

## 2022-07-12 DIAGNOSIS — I10 Essential (primary) hypertension: Secondary | ICD-10-CM

## 2022-07-12 DIAGNOSIS — E1140 Type 2 diabetes mellitus with diabetic neuropathy, unspecified: Secondary | ICD-10-CM

## 2022-07-12 NOTE — Progress Notes (Signed)
   Subjective:    Patient ID: [name], female    DOB: 06-28-1946, 76 y.o.   MRN: [id number]  Hypertension This is a chronic problem. Treatments tried: losartan.   Patient for blood pressure check up.  The patient does have hypertension.   Patient relates dietary measures try to minimize salt The importance of healthy diet and activity were discussed Patient relates compliance  The patient was seen today as part of a comprehensive diabetic check up. Patient has diabetes Patient relates good compliance with taking the medication. We discussed their diet and exercise activities  We also discussed the importance of notifying us if any excessively high glucoses or low sugars.   stress   Review of Systems     Objective:   Physical Exam General-in no acute distress Eyes-no discharge Lungs-respiratory rate normal, CTA CV-no murmurs,RRR Extremities skin warm dry no edema Neuro grossly normal Behavior normal, alert        Assessment & Plan:  1. Essential hypertension Blood pressure good control continue current measures it would be healthy for the patient to fit in more walking more exercise go to the [employer] she is looking at cutting back on her responsibilities at church she does have a 74 year old son who depends on her for dialysis and care associated with that he lives with her - Hemoglobin M5H - Basic Metabolic Panel - Microalbumin/Creatinine Ratio, Urine  2. Type 2 diabetes mellitus with diabetic neuropathy, without long-term current use of insulin (HCC) Minimize starches in the diet stay physically active check lab work await results - Hemoglobin Q4O - Basic Metabolic Panel - Microalbumin/Creatinine Ratio, Urine  3. Hyperlipidemia associated with type 2 diabetes mellitus (HCC) Continue statin previous lipid look good healthier diet recommended regular activity - Hemoglobin N6E - Basic Metabolic Panel - Microalbumin/Creatinine Ratio, Urine  4. Flu  vaccine need Flu shot today - Flu Vaccine QUAD High Dose(Fluad)  5. Stress Minimize stress by cutting back on church responsibilities  6. Morbid obesity ([place]) Portion control regular physical activity healthy eating fit in time to go to the [facility] 3 days/week

## 2022-07-13 LAB — MICROALBUMIN / CREATININE URINE RATIO
Creatinine, Urine: 158.8 mg/dL
Microalb/Creat Ratio: 12 mg/g creat (ref 0–29)
Microalbumin, Urine: 19 ug/mL

## 2022-07-13 LAB — HEMOGLOBIN A1C
Est. average glucose Bld gHb Est-mCnc: 169 mg/dL
Hgb A1c MFr Bld: 7.5 % — ABNORMAL HIGH (ref 4.8–5.6)

## 2022-07-13 LAB — BASIC METABOLIC PANEL
BUN/Creatinine Ratio: 21 (ref 12–28)
BUN: 20 mg/dL (ref 8–27)
CO2: 26 mmol/L (ref 20–29)
Calcium: 9.6 mg/dL (ref 8.7–10.3)
Chloride: 100 mmol/L (ref 96–106)
Creatinine, Ser: 0.95 mg/dL (ref 0.57–1.00)
Glucose: 108 mg/dL — ABNORMAL HIGH (ref 70–99)
Potassium: 4.5 mmol/L (ref 3.5–5.2)
Sodium: 140 mmol/L (ref 134–144)
eGFR: 63 mL/min/{1.73_m2} (ref >59)

## 2022-07-14 ENCOUNTER — Encounter: Payer: Self-pay | Admitting: Family Medicine

## 2022-07-14 NOTE — Progress Notes (Signed)
Please mail to the patient 

## 2022-07-19 ENCOUNTER — Telehealth: Payer: Self-pay | Admitting: Family Medicine

## 2022-07-19 NOTE — Telephone Encounter (Signed)
Patient would like results of recent labs

## 2022-07-27 NOTE — Telephone Encounter (Signed)
Pt contacted and verbalized understanding.  

## 2022-07-29 ENCOUNTER — Ambulatory Visit (INDEPENDENT_AMBULATORY_CARE_PROVIDER_SITE_OTHER): Payer: Medicare HMO | Admitting: Family Medicine

## 2022-07-29 VITALS — BP 110/68 | HR 46 | Temp 97.7°F | Ht 64.0 in | Wt 244.0 lb

## 2022-07-29 DIAGNOSIS — I495 Sick sinus syndrome: Secondary | ICD-10-CM

## 2022-07-29 DIAGNOSIS — I10 Essential (primary) hypertension: Secondary | ICD-10-CM | POA: Diagnosis not present

## 2022-07-29 NOTE — Progress Notes (Signed)
   Subjective:    Patient ID: [name], female    DOB: October 17, 1945, 76 y.o.   MRN: [id number]  HPI  Headaches going on for 2 weeks pain located top of head sown to neck  Mid back to low back pain due to lifting repeatedly  Right knee and left ankle pain - hx of surgery  She relates significant headaches on the right side of the head and parietal region sometimes temporal region sore at times denies high fever chills Is under a lot of stress having to take care of family members frequently Also gets fatigued and tired Patient does have a history of sinus node dysfunction. Review of Systems     Objective:   Physical Exam  Her heart rate is slow in the upper 40s lungs are clear extremities no edema skin warm dry      Assessment & Plan:  EKG shows sinus node dysfunction this is the same as it has been no need to make any changes currently We also discussed what warning signs to watch for regarding bradycardia  Frequent headaches check sedimentation rate make sure this is not temporal arteritis  We also did discuss trying to manage stress to see if that will make things better as well

## 2022-07-30 LAB — SEDIMENTATION RATE: Sed Rate: 27 mm/hr (ref 0–40)

## 2022-07-30 LAB — C-REACTIVE PROTEIN: CRP: 4 mg/L (ref 0–10)

## 2022-08-09 ENCOUNTER — Other Ambulatory Visit: Payer: Self-pay | Admitting: Cardiology

## 2022-08-10 DIAGNOSIS — H401132 Primary open-angle glaucoma, bilateral, moderate stage: Secondary | ICD-10-CM | POA: Diagnosis not present

## 2022-08-17 ENCOUNTER — Telehealth: Payer: Self-pay | Admitting: Family Medicine

## 2022-08-17 NOTE — Telephone Encounter (Signed)
Left message for patient to call back and schedule Medicare Annual Wellness Visit (AWV) in office.   If unable to come into the office for AWV,  please offer to do virtually or by telephone.  Last AWV: 06/02/2021   Please schedule at anytime with [facility] Advisor.  30 minute appointment for Virtual or phone  45 minute appointment for in office or Initial virtual/phone  Any questions, please contact me at [phone]   Thank you,   [place] for [place] [facility] ??[phone] or ??[phone]

## 2022-10-11 ENCOUNTER — Ambulatory Visit: Payer: Medicare HMO | Admitting: Gastroenterology

## 2022-10-11 NOTE — Progress Notes (Unsigned)
GI Office Note    Referring Provider: [name], MD Primary Care Physician:  [name], MD  Primary Gastroenterologist: [name], DO   Chief Complaint   No chief complaint on file.   History of Present Illness   [name] is a 77 y.o. female presenting today for follow up of constipation, abdominal pain, GERD, IBS-C. Last seen in 06/2022.   Tried and failed Colace, Miralax, lactulose, senokot, Amitiza, numerous OTC options. Trulance $2000. Could not afford $45/month copay for Linzess although it did owrk.      EGD June 2017:Normal esophagus. Gastritis. Biopsied, reactive gastropathy no H. pylori. Normal examined duodenum. Biopsied, normal duodenal biopsies.   Colonoscopy June 2021: Single 4 mm polyp in the cecum, tubular adenoma removed. Diverticulosis in the entire examined colon.  Nonbleeding internal hemorrhoids. Recommended no future colonoscopies unless new symptoms develop.    Medications   Current Outpatient Medications  Medication Sig Dispense Refill   acetaminophen (TYLENOL) 500 MG tablet Take 1,000 mg by mouth every 6 (six) hours as needed for mild pain, moderate pain or headache.      Blood Glucose Monitoring Suppl (PRODIGY AUTOCODE BLOOD GLUCOSE) w/Device KIT USE AS DIRECTED 1 kit 10   clopidogrel (PLAVIX) 75 MG tablet TAKE 1 TABLET EVERY DAY (NEED MD APPOINTMENT FOR REFILLS) 90 tablet 3   diclofenac Sodium (VOLTAREN) 1 % GEL Apply 2 g topically 4 (four) times daily. 100 g 1   linaclotide (LINZESS) 145 MCG CAPS capsule Take 1 capsule (145 mcg total) by mouth daily before breakfast. 30 capsule 5   losartan (COZAAR) 25 MG tablet Take 1 tablet (25 mg total) by mouth daily. 90 tablet 3   Multiple Vitamins-Minerals (MULTIVITAMIN WITH MINERALS) tablet Take 1 tablet by mouth daily.     pantoprazole (PROTONIX) 40 MG tablet TAKE 1 TABLET EVERY DAY 90 tablet 1   pravastatin (PRAVACHOL) 20 MG tablet TAKE 1 TABLET EVERY EVENING ( DOSE  DECREASE ) 90 tablet 1   PRODIGY NO CODING BLOOD GLUC test strip USE AS DIRECTED ONE TIME DAILY 100 strip 12   No current facility-administered medications for this visit.    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 mild CAD by cath in 2005 and 2012 b. low-risk NST's in 07/2016 and 05/2019   Cyst on liver   Depression    Diabetes mellitus    controlled by diet   Diverticulitis    DVT of axillary vein, acute right ([place]) 11/24/2012   Dx on Jan 28, 2012.     Fibrocystic breast disease    Hepatic cyst    Hypertension    IBS (irritable bowel syndrome)    Impaired glucose tolerance    Menopause     Past Surgical History   Past Surgical History:  Procedure Laterality Date   ABDOMINAL HYSTERECTOMY     BACK SURGERY     BREAST SURGERY  right   cyst removed   CAST APPLICATION  123456  Procedure: CAST APPLICATION;  Surgeon: Carole Civil, MD;  Location: [facility];  Service: Orthopedics;  Laterality: Right;  procedure room    CHOLECYSTECTOMY     COLONOSCOPY  12/04/2004   NUR:Few small diverticula at sigmoid colon/Small cecal polyp ablated    COLONOSCOPY N/A 09/24/2014   SLF: small internal hemorrhoids   COLONOSCOPY N/A 02/15/2020   Procedure: COLONOSCOPY;  Surgeon: [name], MD;  Location: [facility];  Service: Endoscopy;  Laterality: N/A;  8:30am   ESOPHAGOGASTRODUODENOSCOPY N/A 02/02/2016   Procedure: ESOPHAGOGASTRODUODENOSCOPY (EGD);  Surgeon: Danie Binder, MD;  Location: [facility];  Service:  Endoscopy;  Laterality: N/A;  930    EXTERNAL FIXATION ANKLE FRACTURE     KNEE ARTHROCENTESIS  left   POLYPECTOMY  02/15/2020   Procedure: POLYPECTOMY;  Surgeon: [name], MD;  Location: [facility];  Service: Endoscopy;;   TUBAL LIGATION      Past Family History   Family History  Problem Relation Age of Onset   Stroke Mother    Heart attack Mother    Cancer Sister    Diabetes Sister    Seizures Brother    Diabetes Brother    Heart disease Brother    Kidney disease Son    Colon cancer Neg Hx     Past Social History   Social History   Socioeconomic History   Marital status: Widowed    Spouse name: Not on file   Number of children: 2   Years of education: Not on file   Highest education level: Not on file  Occupational History   Occupation: Retired  Tobacco Use   Smoking status: Never   Smokeless tobacco: Never   Tobacco comments:    Never smoked  Vaping Use   Vaping Use: Never used  Substance and Sexual Activity   Alcohol use: No    Alcohol/week: 0.0 standard drinks of alcohol   Drug use: No   Sexual activity: Yes  Other Topics Concern   Not on file  Social History Narrative   1 son and 1 daughter live close by.   Social Determinants of Health   Financial Resource Strain: Low Risk  (06/02/2021)   Overall Financial Resource Strain (CARDIA)    Difficulty of Paying Living Expenses: Not hard at all  Food Insecurity: No Food Insecurity (06/02/2021)   Hunger Vital Sign    Worried About Running Out of Food in the Last Year: Never true    Ran Out of Food in the Last Year: Never true  Transportation Needs: No Transportation Needs (06/02/2021)   PRAPARE - Hydrologist (Medical): No    Lack of Transportation (Non-Medical): No  Physical Activity: Insufficiently Active (06/02/2021)   Exercise Vital Sign    Days of Exercise per Week: 2 days    Minutes of Exercise per Session: 30 min  Stress: No Stress Concern Present (06/02/2021)    [place]    Feeling of Stress : Not at all  Social Connections: Moderately Integrated (06/02/2021)   Social Connection and Isolation Panel [NHANES]    Frequency of Communication with Friends and Family: More than three times a week    Frequency of Social Gatherings with Friends and Family: More than three times a week    Attends Religious Services: More than 4 times per year    Active Member of [employer] or Organizations: Yes    Attends Club or  Organization Meetings: More than 4 times per year    Marital Status: Widowed  Intimate Partner Violence: Not At Risk (06/02/2021)   Humiliation, Afraid, Rape, and Kick questionnaire    Fear of Current or Ex-Partner: No    Emotionally Abused: No    Physically Abused: No    Sexually Abused: No    Review of Systems   General: Negative for anorexia, weight loss, fever, chills, fatigue, weakness. ENT: Negative for hoarseness, difficulty swallowing , nasal congestion. CV: Negative for chest pain, angina, palpitations, dyspnea on exertion, peripheral edema.  Respiratory: Negative for dyspnea at rest, dyspnea on exertion, cough, sputum, wheezing.  GI: See history of present illness. GU:  Negative for dysuria, hematuria, urinary incontinence, urinary frequency, nocturnal urination.  Endo: Negative for unusual weight change.     Physical Exam   There were no vitals taken for this visit.   General: Well-nourished, well-developed in no acute distress.  Eyes: No icterus. Mouth: Oropharyngeal mucosa moist and pink , no lesions erythema or exudate. Lungs: Clear to auscultation bilaterally.  Heart: Regular rate and rhythm, no murmurs rubs or gallops.  Abdomen: Bowel sounds are normal, nontender, nondistended, no hepatosplenomegaly or masses,  no abdominal bruits or hernia , no rebound or guarding.  Rectal: ***  Extremities: No lower extremity edema. No clubbing or deformities. Neuro:  Alert and oriented x 4   Skin: Warm and dry, no jaundice.   Psych: Alert and cooperative, normal mood and affect.  Labs   *** Imaging Studies   No results found.  Assessment       PLAN   ***   Laureen Ochs. Bobby Rumpf, [place], [facility]

## 2022-10-12 ENCOUNTER — Telehealth: Payer: Self-pay

## 2022-10-12 ENCOUNTER — Ambulatory Visit (HOSPITAL_COMMUNITY)
Admission: RE | Admit: 2022-10-12 | Discharge: 2022-10-12 | Disposition: A | Discharge status: Home / Self Care | Payer: Medicare HMO | Source: Ambulatory Visit | Attending: Gastroenterology | Admitting: Gastroenterology

## 2022-10-12 ENCOUNTER — Telehealth: Payer: Self-pay | Admitting: *Deleted

## 2022-10-12 ENCOUNTER — Encounter: Payer: Self-pay | Admitting: Gastroenterology

## 2022-10-12 ENCOUNTER — Other Ambulatory Visit: Payer: Self-pay | Admitting: Gastroenterology

## 2022-10-12 ENCOUNTER — Ambulatory Visit (INDEPENDENT_AMBULATORY_CARE_PROVIDER_SITE_OTHER): Payer: Medicare HMO | Admitting: Gastroenterology

## 2022-10-12 ENCOUNTER — Other Ambulatory Visit (HOSPITAL_COMMUNITY)
Admission: RE | Admit: 2022-10-12 | Discharge: 2022-10-12 | Disposition: A | Discharge status: Home / Self Care | Payer: Medicare HMO | Source: Ambulatory Visit | Attending: Gastroenterology | Admitting: Gastroenterology

## 2022-10-12 VITALS — BP 153/83 | HR 69 | Temp 97.5°F | Ht 64.0 in | Wt 243.8 lb

## 2022-10-12 DIAGNOSIS — K7689 Other specified diseases of liver: Secondary | ICD-10-CM | POA: Diagnosis not present

## 2022-10-12 DIAGNOSIS — N289 Disorder of kidney and ureter, unspecified: Secondary | ICD-10-CM | POA: Diagnosis not present

## 2022-10-12 DIAGNOSIS — K5904 Chronic idiopathic constipation: Secondary | ICD-10-CM | POA: Diagnosis not present

## 2022-10-12 DIAGNOSIS — R1030 Lower abdominal pain, unspecified: Secondary | ICD-10-CM | POA: Diagnosis not present

## 2022-10-12 DIAGNOSIS — R1032 Left lower quadrant pain: Secondary | ICD-10-CM | POA: Diagnosis not present

## 2022-10-12 LAB — CBC WITH DIFFERENTIAL/PLATELET
Abs Immature Granulocytes: 0.01 10*3/uL (ref 0.00–0.07)
Basophils Absolute: 0 10*3/uL (ref 0.0–0.1)
Basophils Relative: 0 %
Eosinophils Absolute: 0.1 10*3/uL (ref 0.0–0.5)
Eosinophils Relative: 2 %
HCT: 40.6 % (ref 36.0–46.0)
Hemoglobin: 12.8 g/dL (ref 12.0–15.0)
Immature Granulocytes: 0 %
Lymphocytes Relative: 48 %
Lymphs Abs: 2.9 10*3/uL (ref 0.7–4.0)
MCH: 25.2 pg — ABNORMAL LOW (ref 26.0–34.0)
MCHC: 31.5 g/dL (ref 30.0–36.0)
MCV: 79.9 fL — ABNORMAL LOW (ref 80.0–100.0)
Monocytes Absolute: 0.5 10*3/uL (ref 0.1–1.0)
Monocytes Relative: 8 %
Neutro Abs: 2.5 10*3/uL (ref 1.7–7.7)
Neutrophils Relative %: 42 %
Platelets: 364 10*3/uL (ref 150–400)
RBC: 5.08 MIL/uL (ref 3.87–5.11)
RDW: 16.5 % — ABNORMAL HIGH (ref 11.5–15.5)
WBC: 6 10*3/uL (ref 4.0–10.5)
nRBC: 0 % (ref 0.0–0.2)

## 2022-10-12 LAB — COMPREHENSIVE METABOLIC PANEL
ALT: 19 U/L (ref 0–44)
AST: 20 U/L (ref 15–41)
Albumin: 3.9 g/dL (ref 3.5–5.0)
Alkaline Phosphatase: 70 U/L (ref 38–126)
Anion gap: 10 (ref 5–15)
BUN: 16 mg/dL (ref 8–23)
CO2: 25 mmol/L (ref 22–32)
Calcium: 9.1 mg/dL (ref 8.9–10.3)
Chloride: 103 mmol/L (ref 98–111)
Creatinine, Ser: 0.88 mg/dL (ref 0.44–1.00)
GFR, Estimated: >60 mL/min (ref >60)
Glucose, Bld: 116 mg/dL — ABNORMAL HIGH (ref 70–99)
Potassium: 4.1 mmol/L (ref 3.5–5.1)
Sodium: 138 mmol/L (ref 135–145)
Total Bilirubin: 0.4 mg/dL (ref 0.3–1.2)
Total Protein: 7.4 g/dL (ref 6.5–8.1)

## 2022-10-12 LAB — LIPASE, BLOOD: Lipase: 31 U/L (ref 11–51)

## 2022-10-12 MED ORDER — METRONIDAZOLE 500 MG PO TABS: 500.0000 mg | ORAL_TABLET | Freq: Three times a day (TID) | ORAL | 0 refills | Status: AC

## 2022-10-12 MED ORDER — IOHEXOL 300 MG/ML  SOLN
100.0000 mL | Freq: Once | INTRAMUSCULAR | Status: AC | PRN
  Administered 2022-10-12: 100 mL via INTRAVENOUS

## 2022-10-12 MED ORDER — CIPROFLOXACIN HCL 500 MG PO TABS: 500.0000 mg | ORAL_TABLET | Freq: Two times a day (BID) | ORAL | 0 refills | Status: AC

## 2022-10-12 NOTE — Telephone Encounter (Signed)
[facility] Radiology called with a call report on Stat CT. Results are in the chart.

## 2022-10-12 NOTE — Patient Instructions (Addendum)
Please go to [facility] lab for lab work today. CT scan of your abdomen as scheduled.  For now switch to a clear liquid diet until you are feeling better.  For fever, worsening abdominal pain, or inability to eat/drink, please go to the ER.  Samples of Linzess provided, you can take one daily as needed for constipation. We will also work on finding alternative for constipation once we complete your CT scan and labs.  Her blood pressure remains elevated today, likely due to your pain. Please reach out to your PCP if your blood pressure remains elevated more than 140/90. You can recheck at home or at pharmacy, or come by our office or PCP office for recheck.   It was a pleasure to see you today. I want to create trusting relationships with patients and provide genuine, compassionate, and quality care. I truly value your feedback, so please be on the lookout for a survey regarding your visit with me today. I appreciate your time in completing this!

## 2022-10-12 NOTE — Telephone Encounter (Signed)
SEE RESULT NOTE 

## 2022-10-12 NOTE — Telephone Encounter (Signed)
[employer] PA: approval # [id number], DOS: 10/12/22-11/11/22  Pt is scheduled to have CT A/P today 10/12/22 at [facility]. Pt has been informed when done with labs to go to have CT done. Verbalized understanding.

## 2022-10-14 ENCOUNTER — Ambulatory Visit (INDEPENDENT_AMBULATORY_CARE_PROVIDER_SITE_OTHER): Payer: Medicare HMO | Admitting: Family Medicine

## 2022-10-14 VITALS — BP 171/62 | Wt 238.2 lb

## 2022-10-14 DIAGNOSIS — E785 Hyperlipidemia, unspecified: Secondary | ICD-10-CM | POA: Diagnosis not present

## 2022-10-14 DIAGNOSIS — K7689 Other specified diseases of liver: Secondary | ICD-10-CM | POA: Diagnosis not present

## 2022-10-14 DIAGNOSIS — K5792 Diverticulitis of intestine, part unspecified, without perforation or abscess without bleeding: Secondary | ICD-10-CM | POA: Diagnosis not present

## 2022-10-14 DIAGNOSIS — K8689 Other specified diseases of pancreas: Secondary | ICD-10-CM

## 2022-10-14 DIAGNOSIS — N2889 Other specified disorders of kidney and ureter: Secondary | ICD-10-CM

## 2022-10-14 DIAGNOSIS — E1169 Type 2 diabetes mellitus with other specified complication: Secondary | ICD-10-CM | POA: Diagnosis not present

## 2022-10-14 DIAGNOSIS — I700 Atherosclerosis of aorta: Secondary | ICD-10-CM | POA: Diagnosis not present

## 2022-10-14 NOTE — Progress Notes (Signed)
   Subjective:    Patient ID: ILYA ESS, female    DOB: 1945-12-23, 77 y.o.   MRN: [id number]  HPI Patient arrives today to discuss CT scan. Patient states she is having swelling in her stomach.  Patient presents today having significant abdominal pain discomfort more so on the left side and left lower abdomen.  She states very tender she is currently taking Cipro and metronidazole but she states it is making her nauseous she wonders if she needs to keep taking it she denies fever chills vomiting denies bloody stools or diarrhea issues She has lost few pounds but she states it is mainly because she has been on a liquid diet the past week She also had a CAT scan that had multiple findings for which she really does not seem to understand the results and she would like to have them explained  Diverticulitis  Hyperlipidemia associated with type 2 diabetes mellitus ([place]), Chronic  Morbid obesity ([place]), Chronic   Review of Systems     Objective:   Physical Exam General-in no acute distress Eyes-no discharge Lungs-respiratory rate normal, CTA CV-no murmurs,RRR Extremities skin warm dry no edema Neuro grossly normal Behavior normal, alert Tenderness abdomen more so in the left lower abdomen       Assessment & Plan:  1. Diverticulitis She was encouraged to finish out the antibiotics over the course the next 10 days if it becomes unbearable to let us know if fevers let us know also if bloody or mucousy stools immediately follow-up  2. Hyperlipidemia associated with type 2 diabetes mellitus (HCC) Continue statin.  Healthy diet recommended.  Will monitor A1c closely May need to go on medications unfortunately cannot afford GLP-1's currently also not a good candidate for this due to frequent nausea  3. Morbid obesity ([place]) Portion control regular physical activity.  4. Liver cyst Liver cyst is noted we will be doing a follow-up MRI of the abdomen within 6 months  5.  Nodule of kidney Follow-up MRI of the abdomen with contrast this will help determine if this is becoming a growth concerning for cancer or if it is benign  6. Pancreatic calcification MRI as indicated by CAT scan.  Please see radiologist report.  Patient not having any pancreatic plan  7. Aortic atherosclerosis (HCC) Statin, seen on CAT scan, healthy diet

## 2022-10-15 NOTE — Progress Notes (Signed)
10/15/22- message placed in reminder file

## 2022-10-18 ENCOUNTER — Other Ambulatory Visit: Payer: Self-pay | Admitting: Family Medicine

## 2022-10-18 ENCOUNTER — Telehealth: Payer: Self-pay

## 2022-10-18 ENCOUNTER — Telehealth: Payer: Self-pay | Admitting: Gastroenterology

## 2022-10-18 NOTE — Telephone Encounter (Signed)
Pt called stating that the antibiotics are making her nauseous all the time and she is having a lot of cramping. Pt wants to know if there is something else that she can take. Please advise.

## 2022-10-18 NOTE — Telephone Encounter (Signed)
Noted  

## 2022-10-18 NOTE — Telephone Encounter (Signed)
Discussed with PCP, he will plan to follow both the kidney and pancreatic lesions.   Please disregard previous comment to NIC. Thanks Manuela Schwartz. I sent it late last week and it has not been NIC'd yet.

## 2022-10-18 NOTE — Telephone Encounter (Signed)
Called and spoke to patient. She overall feels like her abdominal pain is better. Now feels like antibiotics are causing abdominal cramping, seems to be the cipro. Over the weekend, stopped taking cipro with the flagyl and started alternating. That has helped some. When she takes flagyl she has more stools, some cramping. Cipro makes her feel dizzy and she will lay down with it. She is staying hydrated. Having 4 stools a day, used to be loose but now more formed. No melena, brbpr. No fever. Currently consuming soft diet.   Her sister is at home with hospice, family called in for last hours. She wants to be present but finds the antibiotics causing cramping/dizziness. Advised she could hold dose to be with sister and once return home, she can resume.   Encouraged her to try and at least complete 7 days of abx. She did not want antiemetic. She is to monitor for worsening symptoms. And let us know if not feeling better. She will finish out antibiotics last dose tomorrow morning to complete 7 days.  FYI Tammy

## 2022-10-19 NOTE — Telephone Encounter (Signed)
Don't NIC for imaging. PCP will be taking care of it.

## 2022-11-09 DIAGNOSIS — H401134 Primary open-angle glaucoma, bilateral, indeterminate stage: Secondary | ICD-10-CM | POA: Diagnosis not present

## 2022-11-10 ENCOUNTER — Ambulatory Visit: Payer: Medicare HMO | Admitting: Family Medicine

## 2022-11-19 ENCOUNTER — Ambulatory Visit (INDEPENDENT_AMBULATORY_CARE_PROVIDER_SITE_OTHER): Payer: Medicare HMO | Admitting: Family Medicine

## 2022-11-19 ENCOUNTER — Encounter: Payer: Self-pay | Admitting: Family Medicine

## 2022-11-19 VITALS — BP 122/76 | Ht 64.0 in | Wt 240.8 lb

## 2022-11-19 DIAGNOSIS — G8929 Other chronic pain: Secondary | ICD-10-CM

## 2022-11-19 DIAGNOSIS — E1169 Type 2 diabetes mellitus with other specified complication: Secondary | ICD-10-CM

## 2022-11-19 DIAGNOSIS — E1140 Type 2 diabetes mellitus with diabetic neuropathy, unspecified: Secondary | ICD-10-CM

## 2022-11-19 DIAGNOSIS — M25571 Pain in right ankle and joints of right foot: Secondary | ICD-10-CM

## 2022-11-19 DIAGNOSIS — M25572 Pain in left ankle and joints of left foot: Secondary | ICD-10-CM

## 2022-11-19 DIAGNOSIS — M25511 Pain in right shoulder: Secondary | ICD-10-CM

## 2022-11-19 DIAGNOSIS — E785 Hyperlipidemia, unspecified: Secondary | ICD-10-CM

## 2022-11-19 MED ORDER — CLOPIDOGREL BISULFATE 75 MG PO TABS: ORAL_TABLET | ORAL | 3 refills | Status: DC

## 2022-11-19 NOTE — Progress Notes (Signed)
   Subjective:    Patient ID: [name], female    DOB: 18-Jul-1946, 77 y.o.   MRN: [id number]  Hypertension This is a chronic problem. The current episode started more than 1 year ago.   Type 2 diabetes mellitus with diabetic neuropathy, without long-term current use of insulin ([place]) - Plan: Hemoglobin A1c  Hyperlipidemia associated with type 2 diabetes mellitus ([place]) - Plan: Lipid panel  Acute pain of right shoulder  Chronic pain of both ankles  Patient needs refill of Plavix  Patient states she has pain in both feet at ankles- also has callus on foot Relates a lot of pain in the ankles hurts with certain movements.  Has calluses on the foot along with bunion Patient with pain in right arm intermittent right arm pain in the shoulder hurts with certain movements.  Was lifting of her sister who is dying of disease but passed on. The patient was seen today as part of a comprehensive diabetic check up. Patient has diabetes Patient relates good compliance with taking the medication. We discussed their diet and exercise activities  We also discussed the importance of notifying us if any excessively high glucoses or low sugars.    Patient here for follow-up regarding cholesterol.    Patient relates taking medication on a regular basis Denies problems with medication Importance of dietary measures discussed Regular lab work regarding lipid and liver was checked and if needing additional labs was appropriately ordered   Review of Systems     Objective:   Physical Exam  General-in no acute distress Eyes-no discharge Lungs-respiratory rate normal, CTA CV-no murmurs,RRR Extremities skin warm dry no edema Neuro grossly normal Behavior normal, alert Bunion on the foot callus on the foot Has bilateral ankle discomfort but no obvious fracture or problems seen Shoulder pain discomfort with limited range of motion on the right    Assessment & Plan:  1. Type 2 diabetes  mellitus with diabetic neuropathy, without long-term current use of insulin ([place]) The patient was seen today as part of a comprehensive visit for diabetes. The importance of keeping her A1c at or below 7 range was discussed.  Discussed diet, activity, and medication compliance Emphasized healthy eating primarily with vegetables fruits and if utilizing meats lean meats such as chicken or fish grilled baked broiled Avoid sugary drinks Minimize and avoid processed foods Fit in regular physical activity preferably 25 to 30 minutes 4 times per week Standard follow-up visit recommended.  Patient aware lack of control and follow-up increases risk of diabetic complications. Regular follow-up visits Yearly ophthalmology Yearly foot exam  - Hemoglobin A1c  2. Hyperlipidemia associated with type 2 diabetes mellitus (HCC) Hyperlipidemia-importance of diet, weight control, activity, compliance with medications discussed.   Recent labs reviewed.   Any additional labs or refills ordered.   Importance of keeping under good control discussed. Regular follow-up visits discussed  - Lipid panel  3. Acute pain of right shoulder Recommend range of motion exercises printed out for offered to orthopedics she defers currently if not doing better in the next 3 to 4 weeks she will notify us and we will help set her up with Ortho  4. Chronic pain of both ankles Chronic ankle pain bilateral consistent with pain related to her weight and also orthopedic issues probable arthritis referral to Ortho if ongoing troubles recommend water therapy  Follow-up by July or August

## 2022-11-25 ENCOUNTER — Ambulatory Visit (INDEPENDENT_AMBULATORY_CARE_PROVIDER_SITE_OTHER): Payer: Medicare HMO

## 2022-11-25 ENCOUNTER — Ambulatory Visit: Payer: Medicare HMO | Admitting: Podiatry

## 2022-11-25 DIAGNOSIS — M25372 Other instability, left ankle: Secondary | ICD-10-CM | POA: Diagnosis not present

## 2022-11-25 DIAGNOSIS — M7752 Other enthesopathy of left foot: Secondary | ICD-10-CM | POA: Diagnosis not present

## 2022-11-25 DIAGNOSIS — R52 Pain, unspecified: Secondary | ICD-10-CM | POA: Diagnosis not present

## 2022-11-25 DIAGNOSIS — M778 Other enthesopathies, not elsewhere classified: Secondary | ICD-10-CM | POA: Diagnosis not present

## 2022-11-25 MED ORDER — DICLOFENAC SODIUM 1 % EX GEL: 2.0000 g | Freq: Four times a day (QID) | CUTANEOUS | 2 refills | Status: AC

## 2022-11-25 NOTE — Progress Notes (Signed)
Subjective:   Patient ID: [name], female   DOB: 77 y.o.   MRN: [id number]   HPI Chief Complaint  Patient presents with   Foot Problem    Patient has had surgery on the left foot, lateral side, pain and swelling, has sharp pain, pain in the right foot is located at the ball of foot and mid foot    77 year old female presents for above concerns.  She had surgery 09/2020 and the pain is getting worse.  On the left side she gets discomfort to the outside aspect of her foot and ankle that she has swelling.  She states that it feels a little weak.  Just pain and discomfort to the top of her right foot.  No recent injuries.  Her symptoms seem to be getting worse over time.  No redness or warmth associated with swelling.   Review of Systems  All other systems reviewed and are negative.  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 mild CAD by cath in 2005 and 2012 b. low-risk NST's in 07/2016 and 05/2019   Cyst on liver   Depression    Diabetes mellitus    controlled by diet   Diverticulitis    DVT of axillary vein, acute right ([place]) 11/24/2012   Dx on Jan 28, 2012.     Fibrocystic breast disease    Hepatic cyst    Hypertension    IBS (irritable bowel syndrome)    Impaired glucose tolerance    Menopause     Past Surgical History:  Procedure Laterality Date   ABDOMINAL HYSTERECTOMY     BACK SURGERY     BREAST SURGERY  right   cyst removed   CAST APPLICATION  123456   Procedure: CAST APPLICATION;  Surgeon: Carole Civil, MD;  Location: [facility];  Service: Orthopedics;  Laterality: Right;  procedure room    CHOLECYSTECTOMY     COLONOSCOPY  12/04/2004   NUR:Few small diverticula at sigmoid colon/Small cecal polyp ablated    COLONOSCOPY N/A 09/24/2014   SLF: small internal hemorrhoids   COLONOSCOPY N/A 02/15/2020   Procedure: COLONOSCOPY;  Surgeon: [name], MD;  [facility];  Service:  Endoscopy;  Laterality: N/A;  8:30am   ESOPHAGOGASTRODUODENOSCOPY N/A 02/02/2016   Procedure: ESOPHAGOGASTRODUODENOSCOPY (EGD);  Surgeon: Danie Binder, MD;  [facility];  Service: Endoscopy;  Laterality: N/A;  930    EXTERNAL FIXATION ANKLE FRACTURE     KNEE ARTHROCENTESIS  left   POLYPECTOMY  02/15/2020   Procedure: POLYPECTOMY;  Surgeon: [name], MD;  [facility];  Service: Endoscopy;;   TUBAL LIGATION       Current Outpatient Medications:    diclofenac Sodium (VOLTAREN) 1 % GEL, Apply 2 g topically 4 (four) times daily. Rub into affected area of foot 2 to 4 times daily, Disp: 100 g, Rfl: 2   acetaminophen (TYLENOL) 500 MG tablet, Take 1,000 mg by mouth every 6 (six) hours as needed for mild pain, moderate pain or headache. , Disp: , Rfl:    Blood Glucose Monitoring Suppl (PRODIGY AUTOCODE BLOOD GLUCOSE) w/Device KIT, USE AS DIRECTED, Disp: 1 kit, Rfl: 10   clopidogrel (PLAVIX) 75 MG tablet, TAKE 1 TABLET EVERY DAY, Disp: 90 tablet, Rfl: 3   diclofenac Sodium (VOLTAREN) 1 % GEL, Apply 2 g topically 4 (four) times daily., Disp: 100 g,  Rfl: 1   latanoprost (XALATAN) 0.005 % ophthalmic solution, 1 drop at bedtime., Disp: , Rfl:    linaclotide (LINZESS) 145 MCG CAPS capsule, Take 1 capsule (145 mcg total) by mouth daily before breakfast., Disp: 30 capsule, Rfl: 5   losartan (COZAAR) 25 MG tablet, Take 1 tablet (25 mg total) by mouth daily., Disp: 90 tablet, Rfl: 3   Multiple Vitamins-Minerals (MULTIVITAMIN WITH MINERALS) tablet, Take 1 tablet by mouth daily., Disp: , Rfl:    pantoprazole (PROTONIX) 40 MG tablet, TAKE 1 TABLET EVERY DAY, Disp: 90 tablet, Rfl: 1   pravastatin (PRAVACHOL) 20 MG tablet, TAKE 1 TABLET EVERY EVENING ( DOSE DECREASE ), Disp: 90 tablet, Rfl: 1   PRODIGY NO CODING BLOOD GLUC test strip, USE AS DIRECTED ONE TIME DAILY, Disp: 100 strip, Rfl: 12  Allergies  Allergen Reactions   Atorvastatin     GI Issues   Ibuprofen     Do not take  since she is on antiplatelet   Norvasc [Amlodipine Besylate] Other (See Comments)    Reaction:  Fatigue    Penicillins Hives and Other (See Comments)    Has patient had a PCN reaction causing immediate rash, facial/tongue/throat swelling, SOB or lightheadedness with hypotension: Yes Has patient had a PCN reaction causing severe rash involving mucus membranes or skin necrosis: No Has patient had a PCN reaction that required hospitalization No Has patient had a PCN reaction occurring within the last 10 years: No If all of the above answers are "NO", then may proceed with Cephalosporin use.    Advair Diskus [Fluticasone-Salmeterol] Palpitations   Metformin And Related Other (See Comments)    Reaction:  GI upset           Objective:  Physical Exam  General: AAO x3, NAD  Dermatological: Incision from prior surgery is well-healed.  There is no open lesions.  Vascular: Dorsalis Pedis artery and Posterior Tibial artery pedal pulses are 2/4 bilateral with immedate capillary fill time. . There is no pain with calf compression, swelling, warmth, erythema.   Neruologic: Grossly intact via light touch bilateral.   Musculoskeletal: There is tenderness palpation on the left side along the course of the peroneal tendon as well as the lateral ankle complex into the sinus tarsi there is localized edema to this area.  No erythema or warmth associated with this.  No significant pain to dorsal from left side.  On the right foot there is tenderness on the dorsal aspect midfoot.  Flexor, extensor tendons appear to be intact.  MMT 5/5.  Gait: Unassisted, Nonantalgic.       Assessment:   77 year old female concern for recurrent tear lateral ankle complex, peroneal tendon; right foot capsulitis     Plan:  -Treatment options discussed including all alternatives, risks, and complications -Etiology of symptoms were discussed -X-rays were obtained and reviewed with the patient.  3 views of bilateral  feet were obtained.  No evidence of acute fracture.  Decreased calcaneal inclination angle.  Calcaneal spurring present.  Arthritic changes present of the right foot along midfoot. -We had a long discussion about the various treatment options.  I discussed an MRI, immobilization however after discussion she would hold off on this. We will start physical therapy to include aquatic therapy if able.  Brace as needed.  Continue with supportive shoe gear. -Voltaren gel -No improvement MRI  Trula Slade DPM     Aquatic therpay - in [place]

## 2022-11-29 NOTE — Progress Notes (Unsigned)
GI Office Note    Referring Provider: [name], MD Primary Care Physician:  [name], MD  Primary Gastroenterologist: [name], DO   Chief Complaint   No chief complaint on file.   History of Present Illness   [name] is a 77 y.o. female presenting today for follow up diverticulitis. She also has h/o GERD, IBS-C. Last seen in 09/2022.   Tried and failed Colace, Miralax, lactulose, senokot, Amitiza, numerous OTC options. Trulance $2000. Could not afford $45/month copay for Linzess although it did work.   CT [id number] with uncomplicated sigmoid diverticulitis. She has left kidney lesion and pancreatic lesions that will need follow up MRI in six months, personally discussed with PCP who advised they will monitor.    CT A/P with contrast 09/2022: IMPRESSION: 1. Findings compatible with acute uncomplicated sigmoid diverticulitis. 2. New indeterminate lesion of the lower pole of the left kidney measuring 9 mm. Recommend follow-up contrast-enhanced abdominal MRI in 6 months. 3. 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 Recommend attention on follow-up MRI. 4. Large simple appearing cyst of the right hepatic lobe measuring up to 14 cm, unchanged when compared with the prior exam. 5.  Aortic Atherosclerosis (ICD10-I70.0).      EGD June 2017:Normal esophagus. Gastritis. Biopsied, reactive gastropathy no H. pylori. Normal examined duodenum. Biopsied, normal duodenal biopsies.   Colonoscopy June 2021: Single 4 mm polyp in the cecum, tubular adenoma removed. Diverticulosis in the entire examined colon.  Nonbleeding internal hemorrhoids. Recommended no future colonoscopies unless new symptoms develop.      Medications   Current Outpatient Medications  Medication Sig Dispense Refill   acetaminophen (TYLENOL) 500 MG tablet Take 1,000 mg by mouth every 6 (six) hours as  needed for mild pain, moderate pain or headache.      Blood Glucose Monitoring Suppl (PRODIGY AUTOCODE BLOOD GLUCOSE) w/Device KIT USE AS DIRECTED 1 kit 10   clopidogrel (PLAVIX) 75 MG tablet TAKE 1 TABLET EVERY DAY 90 tablet 3   diclofenac Sodium (VOLTAREN) 1 % GEL Apply 2 g topically 4 (four) times daily. 100 g 1   diclofenac Sodium (VOLTAREN) 1 % GEL Apply 2 g topically 4 (four) times daily. Rub into affected area of foot 2 to 4 times daily 100 g 2   latanoprost (XALATAN) 0.005 % ophthalmic solution 1 drop at bedtime.     linaclotide (LINZESS) 145 MCG CAPS capsule Take 1 capsule (145 mcg total) by mouth daily before breakfast. 30 capsule 5   losartan (COZAAR) 25 MG tablet Take 1 tablet (25 mg total) by mouth daily. 90 tablet 3   Multiple Vitamins-Minerals (MULTIVITAMIN WITH MINERALS) tablet Take 1 tablet by mouth daily.     pantoprazole (PROTONIX) 40 MG tablet TAKE 1 TABLET EVERY DAY 90 tablet 1   pravastatin (PRAVACHOL) 20 MG tablet TAKE 1 TABLET EVERY EVENING ( DOSE DECREASE ) 90 tablet 1   PRODIGY NO CODING BLOOD GLUC test strip USE AS DIRECTED ONE TIME DAILY 100 strip 12   No current facility-administered medications for this visit.    Allergies   Allergies as of 11/30/2022 - Review Complete 11/19/2022  Allergen Reaction Noted   Atorvastatin  06/30/2022   Ibuprofen  05/19/2021   Norvasc [amlodipine besylate] Other (See Comments) 11/21/2012   Penicillins Hives and Other (See Comments)    Advair diskus [fluticasone-salmeterol] Palpitations 11/21/2012   Metformin and related Other (See Comments)  09/28/2013     Past Medical History   Past Medical History:  Diagnosis Date   Blood clot of artery under arm ([place])    Chest pain    a. mild CAD by cath in 2005 and 2012 b. low-risk NST's in 07/2016 and 05/2019   Cyst on liver   Depression    Diabetes mellitus    controlled by diet   Diverticulitis    DVT of axillary vein, acute right ([place]) 11/24/2012   Dx on Jan 28, 2012.      Fibrocystic breast disease    Hepatic cyst    Hypertension    IBS (irritable bowel syndrome)    Impaired glucose tolerance    Menopause     Past Surgical History   Past Surgical History:  Procedure Laterality Date   ABDOMINAL HYSTERECTOMY     BACK SURGERY     BREAST SURGERY  right   cyst removed   CAST APPLICATION  123456   Procedure: CAST APPLICATION;  Surgeon: Carole Civil, MD;  Location: [facility];  Service: Orthopedics;  Laterality: Right;  procedure room    CHOLECYSTECTOMY     COLONOSCOPY  12/04/2004   NUR:Few small diverticula at sigmoid colon/Small cecal polyp ablated    COLONOSCOPY N/A 09/24/2014   SLF: small internal hemorrhoids   COLONOSCOPY N/A 02/15/2020   Procedure: COLONOSCOPY;  Surgeon: [name], MD;  [facility];  Service: Endoscopy;  Laterality: N/A;  8:30am   ESOPHAGOGASTRODUODENOSCOPY N/A 02/02/2016   Procedure: ESOPHAGOGASTRODUODENOSCOPY (EGD);  Surgeon: Danie Binder, MD;  [facility];  Service: Endoscopy;  Laterality: N/A;  930    EXTERNAL FIXATION ANKLE FRACTURE     KNEE ARTHROCENTESIS  left   POLYPECTOMY  02/15/2020   Procedure: POLYPECTOMY;  Surgeon: [name], MD;  [facility];  Service: Endoscopy;;   TUBAL LIGATION      Past Family History   Family History  Problem Relation Age of Onset   Stroke Mother    Heart attack Mother    Cancer Sister    Diabetes Sister    Seizures Brother    Diabetes Brother    Heart disease Brother    Kidney disease Son    Colon cancer Neg Hx     Past Social History   Social History   Socioeconomic History   Marital status: Widowed    Spouse name: Not on file   Number of children: 2   Years of education: Not on file   Highest education level: Not on file  Occupational History   Occupation: Retired  Tobacco Use   Smoking status: Never   Smokeless tobacco: Never   Tobacco comments:    Never smoked  Vaping Use   Vaping Use: Never used   Substance and Sexual Activity   Alcohol use: No    Alcohol/week: 0.0 standard drinks of alcohol   Drug use: No   Sexual activity: Yes  Other Topics Concern   Not on file  Social History Narrative   1 son and 1 daughter live close by.   Social Determinants of Health   Financial Resource Strain: Low Risk  (06/02/2021)   Overall Financial Resource Strain (CARDIA)    Difficulty of Paying Living Expenses: Not hard at all  Food Insecurity: No Food Insecurity (06/02/2021)   Hunger Vital Sign    Worried About Running Out of Food in the Last Year: Never true    Ran Out of Food  in the Last Year: Never true  Transportation Needs: No Transportation Needs (06/02/2021)   PRAPARE - Hydrologist (Medical): No    Lack of Transportation (Non-Medical): No  Physical Activity: Insufficiently Active (06/02/2021)   Exercise Vital Sign    Days of Exercise per Week: 2 days    Minutes of Exercise per Session: 30 min  Stress: No Stress Concern Present (06/02/2021)   [place]    Feeling of Stress : Not at all  Social Connections: Moderately Integrated (06/02/2021)   Social Connection and Isolation Panel [NHANES]    Frequency of Communication with Friends and Family: More than three times a week    Frequency of Social Gatherings with Friends and Family: More than three times a week    Attends Religious Services: More than 4 times per year    Active Member of [employer] or Organizations: Yes    Attends Archivist Meetings: More than 4 times per year    Marital Status: Widowed  Intimate Partner Violence: Not At Risk (06/02/2021)   Humiliation, Afraid, Rape, and Kick questionnaire    Fear of Current or Ex-Partner: No    Emotionally Abused: No    Physically Abused: No    Sexually Abused: No    Review of Systems   General: Negative for anorexia, weight loss, fever, chills, fatigue, weakness. ENT: Negative  for hoarseness, difficulty swallowing , nasal congestion. CV: Negative for chest pain, angina, palpitations, dyspnea on exertion, peripheral edema.  Respiratory: Negative for dyspnea at rest, dyspnea on exertion, cough, sputum, wheezing.  GI: See history of present illness. GU:  Negative for dysuria, hematuria, urinary incontinence, urinary frequency, nocturnal urination.  Endo: Negative for unusual weight change.     Physical Exam   There were no vitals taken for this visit.   General: Well-nourished, well-developed in no acute distress.  Eyes: No icterus. Mouth: Oropharyngeal mucosa moist and pink , no lesions erythema or exudate. Lungs: Clear to auscultation bilaterally.  Heart: Regular rate and rhythm, no murmurs rubs or gallops.  Abdomen: Bowel sounds are normal, nontender, nondistended, no hepatosplenomegaly or masses,  no abdominal bruits or hernia , no rebound or guarding.  Rectal: ***  Extremities: No lower extremity edema. No clubbing or deformities. Neuro: Alert and oriented x 4   Skin: Warm and dry, no jaundice.   Psych: Alert and cooperative, normal mood and affect.  Labs   Lab Results  Component Value Date   CREATININE 0.88 10/12/2022   BUN 16 10/12/2022   NA 138 10/12/2022   K 4.1 10/12/2022   CL 103 10/12/2022   CO2 25 10/12/2022   Lab Results  Component Value Date   WBC 6.0 10/12/2022   HGB 12.8 10/12/2022   HCT 40.6 10/12/2022   MCV 79.9 (L) 10/12/2022   PLT 364 10/12/2022   Lab Results  Component Value Date   LIPASE 31 10/12/2022      Imaging Studies   No results found.  Assessment       PLAN   ***   Laureen Ochs. Bobby Rumpf, [place], [facility]

## 2022-11-30 ENCOUNTER — Ambulatory Visit: Payer: Medicare HMO | Admitting: Gastroenterology

## 2022-11-30 ENCOUNTER — Encounter: Payer: Self-pay | Admitting: Gastroenterology

## 2022-11-30 ENCOUNTER — Telehealth: Payer: Self-pay | Admitting: Gastroenterology

## 2022-11-30 VITALS — BP 139/66 | HR 70 | Temp 96.9°F | Ht 64.0 in | Wt 244.2 lb

## 2022-11-30 DIAGNOSIS — E785 Hyperlipidemia, unspecified: Secondary | ICD-10-CM | POA: Diagnosis not present

## 2022-11-30 DIAGNOSIS — K5904 Chronic idiopathic constipation: Secondary | ICD-10-CM | POA: Diagnosis not present

## 2022-11-30 DIAGNOSIS — R933 Abnormal findings on diagnostic imaging of other parts of digestive tract: Secondary | ICD-10-CM | POA: Diagnosis not present

## 2022-11-30 DIAGNOSIS — E1140 Type 2 diabetes mellitus with diabetic neuropathy, unspecified: Secondary | ICD-10-CM | POA: Diagnosis not present

## 2022-11-30 DIAGNOSIS — E1169 Type 2 diabetes mellitus with other specified complication: Secondary | ICD-10-CM | POA: Diagnosis not present

## 2022-11-30 MED ORDER — LINACLOTIDE 145 MCG PO CAPS: 145.0000 ug | ORAL_CAPSULE | Freq: Every day | ORAL | 3 refills | Status: DC

## 2022-11-30 NOTE — Patient Instructions (Signed)
RX for [employer] sent to [facility]. Samples provided today. Take once daily on empty stomach for constipation. We will reach out to cardiology regarding holding Plavix prior to upcoming colonoscopy. Once we hear from them we will call to schedule.

## 2022-11-30 NOTE — Telephone Encounter (Signed)
Patient needs to be scheduled for colonoscopy for abnormal sigmoid colon on CT. ASA 3. Dr. Abbey Chatters.  -->Please get approval to hold plavix for 5 days before. She reports that cardiology ordered for the plavix but sometimes her PCP writes the prescription.   Prep instructions: -hold plavix 5 days before. -linzess 145 mcg daily for 5 days before. She can request samples if needed. I sent in rx today.

## 2022-12-01 ENCOUNTER — Telehealth: Payer: Self-pay | Admitting: *Deleted

## 2022-12-01 LAB — LIPID PANEL
Chol/HDL Ratio: 3.2 ratio (ref 0.0–4.4)
Cholesterol, Total: 152 mg/dL (ref 100–199)
HDL: 48 mg/dL (ref >39)
LDL Chol Calc (NIH): 86 mg/dL (ref 0–99)
Triglycerides: 96 mg/dL (ref 0–149)
VLDL Cholesterol Cal: 18 mg/dL (ref 5–40)

## 2022-12-01 LAB — HEMOGLOBIN A1C
Est. average glucose Bld gHb Est-mCnc: 166 mg/dL
Hgb A1c MFr Bld: 7.4 % — ABNORMAL HIGH (ref 4.8–5.6)

## 2022-12-01 NOTE — Telephone Encounter (Signed)
  Request for patient to stop medication prior to procedure or is needing cleareance  12/01/22  [name] [birth year]  What type of surgery is being performed? Colonoscopy  When is surgery scheduled? TBD  What type of clearance is required (medical or pharmacy to hold medication or both? medication  Are there any medications that need to be held prior to surgery and how long? Plavix x 5 days  Name of physician performing surgery?  [facility]: [phone] Fax: [phone]  Anethesia type (none, local, MAC, general)? MAC

## 2022-12-01 NOTE — Telephone Encounter (Signed)
Clearance sent 

## 2022-12-01 NOTE — Telephone Encounter (Signed)
   Patient Name: [name]  DOB: 02/28/1946 MRN: [id number]  Primary Cardiologist: [name], MD  Chart reviewed as part of pre-operative protocol coverage.   Patient is currently on Plavix due to intolerance to aspirin and secondary prevention.  Per protocol patient can hold Plavix 5 days prior to scheduled procedure and should restart once deemed safest by requesting provider postprocedure.   Mable Fill, Marissa Nestle, NP 12/01/2022, 7:58 AM

## 2022-12-03 DIAGNOSIS — M62572 Muscle wasting and atrophy, not elsewhere classified, left ankle and foot: Secondary | ICD-10-CM | POA: Diagnosis not present

## 2022-12-03 DIAGNOSIS — R2681 Unsteadiness on feet: Secondary | ICD-10-CM | POA: Diagnosis not present

## 2022-12-03 DIAGNOSIS — M25572 Pain in left ankle and joints of left foot: Secondary | ICD-10-CM | POA: Diagnosis not present

## 2022-12-03 DIAGNOSIS — M25672 Stiffness of left ankle, not elsewhere classified: Secondary | ICD-10-CM | POA: Diagnosis not present

## 2022-12-06 DIAGNOSIS — R2681 Unsteadiness on feet: Secondary | ICD-10-CM | POA: Diagnosis not present

## 2022-12-06 DIAGNOSIS — M25572 Pain in left ankle and joints of left foot: Secondary | ICD-10-CM | POA: Diagnosis not present

## 2022-12-06 DIAGNOSIS — M25672 Stiffness of left ankle, not elsewhere classified: Secondary | ICD-10-CM | POA: Diagnosis not present

## 2022-12-06 DIAGNOSIS — M62572 Muscle wasting and atrophy, not elsewhere classified, left ankle and foot: Secondary | ICD-10-CM | POA: Diagnosis not present

## 2022-12-07 ENCOUNTER — Telehealth: Payer: Self-pay

## 2022-12-07 NOTE — Telephone Encounter (Signed)
So unfortunately this is where it becomes confusing Her A1c is elevated she needs to be on medication According to my message with the result notes she was not able to tolerate metformin stating that it caused GI upset We recommended a medicine called Januvia-we were using Januvia because she stated that she could not tolerate metformin Now this patient is requesting metformin-so as you can see I wonder if there is a communication issue? Please verify with the patient which way does she want [place] to go?

## 2022-12-07 NOTE — Telephone Encounter (Signed)
Patient is requesting metformin prescription to be sent to [facility] pharmacy per most recent lab result notes, please send to [facility].

## 2022-12-08 DIAGNOSIS — M62572 Muscle wasting and atrophy, not elsewhere classified, left ankle and foot: Secondary | ICD-10-CM | POA: Diagnosis not present

## 2022-12-08 DIAGNOSIS — M25572 Pain in left ankle and joints of left foot: Secondary | ICD-10-CM | POA: Diagnosis not present

## 2022-12-08 DIAGNOSIS — M25672 Stiffness of left ankle, not elsewhere classified: Secondary | ICD-10-CM | POA: Diagnosis not present

## 2022-12-08 DIAGNOSIS — R2681 Unsteadiness on feet: Secondary | ICD-10-CM | POA: Diagnosis not present

## 2022-12-08 NOTE — Telephone Encounter (Signed)
I clarified the medication being requested with patient and she states metformin is the one that makes her sick, and would like to try and send in Januvia to [facility].

## 2022-12-09 ENCOUNTER — Telehealth: Payer: Self-pay | Admitting: Family Medicine

## 2022-12-09 ENCOUNTER — Other Ambulatory Visit: Payer: Self-pay | Admitting: Family Medicine

## 2022-12-09 DIAGNOSIS — E1140 Type 2 diabetes mellitus with diabetic neuropathy, unspecified: Secondary | ICD-10-CM

## 2022-12-09 DIAGNOSIS — E1169 Type 2 diabetes mellitus with other specified complication: Secondary | ICD-10-CM

## 2022-12-09 MED ORDER — SITAGLIPTIN PHOSPHATE 50 MG PO TABS: 50.0000 mg | ORAL_TABLET | Freq: Every day | ORAL | 1 refills | Status: DC

## 2022-12-09 NOTE — Telephone Encounter (Signed)
Nurses Additional message-have patient do A1c, metabolic 7, lipid before her next visit a few days before her visit would be fine she has a visit in August thank you

## 2022-12-09 NOTE — Telephone Encounter (Signed)
Nurses-FYI-Januvia was sent in to [facility]

## 2022-12-09 NOTE — Telephone Encounter (Signed)
Recommend Januvia 50 mg 1 daily #90 with 1 refill

## 2022-12-10 ENCOUNTER — Other Ambulatory Visit: Payer: Self-pay

## 2022-12-10 ENCOUNTER — Other Ambulatory Visit: Payer: Self-pay | Admitting: Family Medicine

## 2022-12-10 NOTE — Telephone Encounter (Signed)
Clearance sent to provider.

## 2022-12-10 NOTE — Telephone Encounter (Signed)
Patient is aware per drs notes and recommendations.

## 2022-12-11 NOTE — Telephone Encounter (Signed)
Ok to schedule, per orders on telephone note 11/30/22.

## 2022-12-13 ENCOUNTER — Telehealth: Payer: Self-pay | Admitting: Family Medicine

## 2022-12-13 DIAGNOSIS — R2681 Unsteadiness on feet: Secondary | ICD-10-CM | POA: Diagnosis not present

## 2022-12-13 DIAGNOSIS — M25672 Stiffness of left ankle, not elsewhere classified: Secondary | ICD-10-CM | POA: Diagnosis not present

## 2022-12-13 DIAGNOSIS — M25572 Pain in left ankle and joints of left foot: Secondary | ICD-10-CM | POA: Diagnosis not present

## 2022-12-13 DIAGNOSIS — M62572 Muscle wasting and atrophy, not elsewhere classified, left ankle and foot: Secondary | ICD-10-CM | POA: Diagnosis not present

## 2022-12-13 MED ORDER — PRODIGY NO CODING BLOOD GLUC VI STRP: ORAL_STRIP | 1 refills | Status: DC

## 2022-12-13 NOTE — Telephone Encounter (Signed)
Received via fax Rx request: Prescription sent electronically to pharmacy  

## 2022-12-13 NOTE — Telephone Encounter (Signed)
Prescription Request  12/13/2022  LOV: 11/19/2022  What is the name of the medication or equipment? PRODIGY NO CODING BLOOD GLUC test strip   Have you contacted your pharmacy to request a refill? Yes   Which pharmacy would you like this sent to?  [facility] - [place], [place] - [address] [place] [address] Phone: [phone] Fax: [phone]    Patient notified that their request is being sent to the clinical staff for review and that they should receive a response within 2 business days.   Please advise at [facility] [phone]

## 2022-12-14 ENCOUNTER — Encounter: Payer: Self-pay | Admitting: *Deleted

## 2022-12-14 ENCOUNTER — Telehealth: Payer: Self-pay | Admitting: Family Medicine

## 2022-12-14 ENCOUNTER — Other Ambulatory Visit: Payer: Self-pay | Admitting: *Deleted

## 2022-12-14 MED ORDER — NA SULFATE-K SULFATE-MG SULF 17.5-3.13-1.6 GM/177ML PO SOLN: ORAL | 0 refills | Status: DC

## 2022-12-14 NOTE — Telephone Encounter (Signed)
Refill  on Prodigy twist top 28G lancet sent to [facility]

## 2022-12-14 NOTE — Telephone Encounter (Signed)
Left detailed message per DPR on Home answering machine to get lab work a few days before appointment with Dr Lorin Picket informed patient to call office with any concerns or questions.

## 2022-12-14 NOTE — Telephone Encounter (Signed)
Pt has been scheduled for 01/10/23. Instructions mailed and prep sent to the pharmacy 

## 2022-12-16 MED ORDER — PRODIGY TWIST TOP LANCETS 28G MISC: 5 refills | Status: DC

## 2022-12-16 NOTE — Telephone Encounter (Signed)
Script sent to pharmacy today.,

## 2022-12-20 DIAGNOSIS — M62572 Muscle wasting and atrophy, not elsewhere classified, left ankle and foot: Secondary | ICD-10-CM | POA: Diagnosis not present

## 2022-12-20 DIAGNOSIS — R2681 Unsteadiness on feet: Secondary | ICD-10-CM | POA: Diagnosis not present

## 2022-12-20 DIAGNOSIS — M25672 Stiffness of left ankle, not elsewhere classified: Secondary | ICD-10-CM | POA: Diagnosis not present

## 2022-12-20 DIAGNOSIS — M25572 Pain in left ankle and joints of left foot: Secondary | ICD-10-CM | POA: Diagnosis not present

## 2022-12-23 ENCOUNTER — Other Ambulatory Visit: Payer: Self-pay | Admitting: Family Medicine

## 2022-12-23 DIAGNOSIS — R2681 Unsteadiness on feet: Secondary | ICD-10-CM | POA: Diagnosis not present

## 2022-12-23 DIAGNOSIS — M62572 Muscle wasting and atrophy, not elsewhere classified, left ankle and foot: Secondary | ICD-10-CM | POA: Diagnosis not present

## 2022-12-23 DIAGNOSIS — M25572 Pain in left ankle and joints of left foot: Secondary | ICD-10-CM | POA: Diagnosis not present

## 2022-12-23 DIAGNOSIS — M25672 Stiffness of left ankle, not elsewhere classified: Secondary | ICD-10-CM | POA: Diagnosis not present

## 2022-12-27 DIAGNOSIS — M25672 Stiffness of left ankle, not elsewhere classified: Secondary | ICD-10-CM | POA: Diagnosis not present

## 2022-12-27 DIAGNOSIS — M25572 Pain in left ankle and joints of left foot: Secondary | ICD-10-CM | POA: Diagnosis not present

## 2022-12-27 DIAGNOSIS — R2681 Unsteadiness on feet: Secondary | ICD-10-CM | POA: Diagnosis not present

## 2022-12-27 DIAGNOSIS — M62572 Muscle wasting and atrophy, not elsewhere classified, left ankle and foot: Secondary | ICD-10-CM | POA: Diagnosis not present

## 2022-12-29 ENCOUNTER — Encounter: Payer: Self-pay | Admitting: Student

## 2022-12-29 ENCOUNTER — Ambulatory Visit: Payer: Medicare HMO | Attending: Student | Admitting: Student

## 2022-12-29 VITALS — BP 138/74 | HR 50 | Ht 65.0 in | Wt 246.0 lb

## 2022-12-29 DIAGNOSIS — E785 Hyperlipidemia, unspecified: Secondary | ICD-10-CM

## 2022-12-29 DIAGNOSIS — Z87898 Personal history of other specified conditions: Secondary | ICD-10-CM

## 2022-12-29 DIAGNOSIS — I2510 Atherosclerotic heart disease of native coronary artery without angina pectoris: Secondary | ICD-10-CM | POA: Diagnosis not present

## 2022-12-29 DIAGNOSIS — I10 Essential (primary) hypertension: Secondary | ICD-10-CM

## 2022-12-29 DIAGNOSIS — R001 Bradycardia, unspecified: Secondary | ICD-10-CM | POA: Diagnosis not present

## 2022-12-29 NOTE — Progress Notes (Signed)
Cardiology Office Note    Date:  12/29/2022  ID:  [name], DOB June 06, 1946, MRN [id number] Cardiologist: Dina Rich, MD    History of Present Illness:    [name] is a 77 y.o. female with past medical history of chest pain (mild CAD by caths in 2005 and 2012 with low-risk NST's in 07/2016 and 05/2019), baseline bradycardia, HTN, HLD and history of DVT who presents to the office today for 37-month follow-up.  She was examined by myself in 06/2022 and reported being under increased stress at that time and recently stopped exercising but was planning to resume going to the [facility] for routine exercise. She did report discomfort along her breast in the setting of fibrotic tissue but denied any specific anginal symptoms. Was continued on Plavix (previously intolerant to ASA), Losartan and Pravastatin. She was not on beta-blocker therapy due to bradycardia.  In talking with the patient today, she reports her biggest issue has been continued pain along her left ankle/foot and she is being followed by Podiatry. Also participating in PT at least twice a week. She has been less active with all this going on due to her pain. She is having shortness of breath with activity which she feels is due to deconditioning and says this has been a gradual change over the past few months. No associated exertional chest pain or palpitations. She does have fibrotic breast tissue and experiences some discomfort along her breasts at times but this has been occurring for several years. No recent orthopnea or PND. She does experience swelling along her left leg when she has been wearing her ankle brace but this resolves with removal of her brace. She has been consuming fast food more routinely as she has difficulty standing to cook.  Studies Reviewed:   EKG: EKG is not ordered today.  NST: 05/2019 There was no ST segment deviation noted during stress. RBBB seen throughout study. The study is  normal. No myocardial ischemia or scar. This is a low risk study. Nuclear stress EF: 73%.  Event Monitor: 04/2021 7 day monitor Occasional supraventricular ectopy in the form of isolated PACs, rare couplets and triplets. Rare SVT episodes up to 18 beats Rare ventricular ectopy in the form of PVCs and couplets. Isolated accelerated idioventricular rhythm 12 beats 14 sinus pauses occurred, longest lasting 4.3 seconds. Pauses were nocturnal. No symptoms reported     Patch Wear Time:  6 days and 20 hours (2022-07-26T10:59:28-0400 to 2022-08-02T07:34:34-0400)   Patient had a min HR of 31 bpm, max HR of 169 bpm, and avg HR of 55 bpm. Predominant underlying rhythm was Sinus Rhythm. Bundle Branch Block/IVCD was present. 1 run of Ventricular Tachycardia occurred lasting 12 beats with a max rate of 126 bpm (avg 103  bpm). 8 Supraventricular Tachycardia runs occurred, the run with the fastest interval lasting 8 beats with a max rate of 169 bpm, the longest lasting 18 beats with an avg rate of 94 bpm. 14 Pauses occurred, the longest lasting 4.3 secs (14 bpm).  Junctional Rhythm was present. Isolated SVEs were occasional (2.9%, 15250), SVE Couplets were rare (<1.0%, 1071), and SVE Triplets were rare (<1.0%, 51). Isolated VEs were rare (<1.0%), VE Couplets were rare (<1.0%), and no VE Triplets were present.   Physical Exam:   VS:  BP 138/74   Pulse (!) 50   Ht 5\' 5"  (1.651 m)   Wt 246 lb (111.6 kg)   SpO2 98%   BMI 40.94 kg/m  Wt Readings from Last 3 Encounters:  12/29/22 246 lb (111.6 kg)  11/30/22 244 lb 3.2 oz (110.8 kg)  11/19/22 240 lb 12.8 oz (109.2 kg)     GEN: Pleasant female appearing in no acute distress NECK: No JVD; No carotid bruits CARDIAC: RRR, no murmurs, rubs, gallops RESPIRATORY:  Clear to auscultation without rales, wheezing or rhonchi  ABDOMEN: Appears non-distended. No obvious abdominal masses. EXTREMITIES: No clubbing or cyanosis. No pitting edema.  Distal pedal  pulses are 2+ bilaterally.   Assessment and Plan:   1. History of Chest Pain/CAD - She had mild CAD by prior catheterizations with most recent being in 2012 and she did have a low risk NST in 05/2019.  - She has fibrotic breast tissue with associated discomfort with this but denies any exertional chest pain along her pectoral region. She reports dyspnea on exertion which has been occurring for several months as well but no acute changes in this. We reviewed the possibility of a follow-up echocardiogram but she wishes to hold off on this given the stability of her symptoms. I encouraged her to make [place] aware in the interim if she wishes to pursue this. - Continue with risk factor modification. She remains on Plavix 75 mg daily (previous intolerant to ASA) and Pravastatin 20 mg daily. She is not on a beta-blocker due to bradycardia. She was previously cleared by our Preop Clinic to hold Plavix for 5 days prior to her upcoming colonoscopy.  2. HTN - Her BP was initially recorded at 150/78, rechecked and improved to 138/74. Will continue current medical therapy with Losartan 25 mg daily. Also recommended reduction in her sodium intake.  3. HLD - FLP in 11/2022 showed total cholesterol 152, triglycerides 96, HDL 48 and LDL 86. She remains on Pravastatin 20 mg daily as she was previously intolerant to higher intensity statin therapy.  4. Bradycardia - Previously evaluated by EP and was not felt to require PPM placement in the past. Continue to avoid AV nodal blocking agents.   Signed, Ellsworth Lennox, PA-C

## 2022-12-29 NOTE — Patient Instructions (Signed)
Medication Instructions:  Your physician recommends that you continue on your current medications as directed. Please refer to the Current Medication list given to you today.  Hold Plavix 5 days prior to colonoscopy   *If you need a refill on your cardiac medications before your next appointment, please call your pharmacy*   Lab Work: NONE   If you have labs (blood work) drawn today and your tests are completely normal, you will receive your results only by: MyChart Message (if you have MyChart) OR A paper copy in the mail If you have any lab test that is abnormal or we need to change your treatment, we will call you to review the results.   Testing/Procedures: NONE    Follow-Up: At [facility], you and your health needs are our priority.  As part of our continuing mission to provide you with exceptional heart care, we have created designated Provider Care Teams.  These Care Teams include your primary Cardiologist (physician) and Advanced Practice Providers (APPs -  Physician Assistants and Nurse Practitioners) who all work together to provide you with the care you need, when you need it.  We recommend signing up for the patient portal called "MyChart".  Sign up information is provided on this After Visit Summary.  MyChart is used to connect with patients for Virtual Visits (Telemedicine).  Patients are able to view lab/test results, encounter notes, upcoming appointments, etc.  Non-urgent messages can be sent to your provider as well.   To learn more about what you can do with MyChart, go to [email or web address].    Your next appointment:   6 month(s)  Provider:   You may see Dina Rich, MD or one of the following Advanced Practice Providers on your designated Care Team:   Randall An, PA-C  Jacolyn Reedy, [place]     Other Instructions Thank you for choosing [facility]!

## 2022-12-30 DIAGNOSIS — M62572 Muscle wasting and atrophy, not elsewhere classified, left ankle and foot: Secondary | ICD-10-CM | POA: Diagnosis not present

## 2022-12-30 DIAGNOSIS — M25672 Stiffness of left ankle, not elsewhere classified: Secondary | ICD-10-CM | POA: Diagnosis not present

## 2022-12-30 DIAGNOSIS — M25572 Pain in left ankle and joints of left foot: Secondary | ICD-10-CM | POA: Diagnosis not present

## 2022-12-30 DIAGNOSIS — R2681 Unsteadiness on feet: Secondary | ICD-10-CM | POA: Diagnosis not present

## 2023-01-03 DIAGNOSIS — R2681 Unsteadiness on feet: Secondary | ICD-10-CM | POA: Diagnosis not present

## 2023-01-03 DIAGNOSIS — M25672 Stiffness of left ankle, not elsewhere classified: Secondary | ICD-10-CM | POA: Diagnosis not present

## 2023-01-03 DIAGNOSIS — M62572 Muscle wasting and atrophy, not elsewhere classified, left ankle and foot: Secondary | ICD-10-CM | POA: Diagnosis not present

## 2023-01-03 DIAGNOSIS — M25572 Pain in left ankle and joints of left foot: Secondary | ICD-10-CM | POA: Diagnosis not present

## 2023-01-05 DIAGNOSIS — M25572 Pain in left ankle and joints of left foot: Secondary | ICD-10-CM | POA: Diagnosis not present

## 2023-01-05 DIAGNOSIS — R2681 Unsteadiness on feet: Secondary | ICD-10-CM | POA: Diagnosis not present

## 2023-01-05 DIAGNOSIS — M25672 Stiffness of left ankle, not elsewhere classified: Secondary | ICD-10-CM | POA: Diagnosis not present

## 2023-01-05 DIAGNOSIS — M62572 Muscle wasting and atrophy, not elsewhere classified, left ankle and foot: Secondary | ICD-10-CM | POA: Diagnosis not present

## 2023-01-07 ENCOUNTER — Other Ambulatory Visit (HOSPITAL_COMMUNITY): Payer: Medicare HMO

## 2023-01-10 DIAGNOSIS — M62572 Muscle wasting and atrophy, not elsewhere classified, left ankle and foot: Secondary | ICD-10-CM | POA: Diagnosis not present

## 2023-01-10 DIAGNOSIS — M25572 Pain in left ankle and joints of left foot: Secondary | ICD-10-CM | POA: Diagnosis not present

## 2023-01-10 DIAGNOSIS — M25672 Stiffness of left ankle, not elsewhere classified: Secondary | ICD-10-CM | POA: Diagnosis not present

## 2023-01-10 DIAGNOSIS — R2681 Unsteadiness on feet: Secondary | ICD-10-CM | POA: Diagnosis not present

## 2023-01-10 NOTE — Patient Instructions (Signed)
[name]  01/10/2023     @PREFPERIOPPHARMACY @   Your procedure is scheduled on  01/17/2023.   Report to [facility] at  0815  A.M.   Call this number if you have problems the morning of surgery:  [phone]  If you experience any cold or flu symptoms such as cough, fever, chills, shortness of breath, etc. between now and your scheduled surgery, please notify us at the above number.   Remember:  Follow the diet and prep instructions given to you by the office.      Your last dose of januvia should be on 01/15/2023.     Your last dose of plavix should be on 01/11/2023.          DO NOT take any medications for diabetes the morning of your procedure.     Take these medicines the morning of surgery with A SIP OF WATER                                          pantoprazole.    Do not wear jewelry, make-up or nail polish.  Do not wear lotions, powders, or perfumes, or deodorant.  Do not shave 48 hours prior to surgery.  Men may shave face and neck.  Do not bring valuables to the hospital.  [facility] is not responsible for any belongings or valuables.  Contacts, dentures or bridgework may not be worn into surgery.  Leave your suitcase in the car.  After surgery it may be brought to your room.  For patients admitted to the hospital, discharge time will be determined by your treatment team.  Patients discharged the day of surgery will not be allowed to drive home and must have someone with them for 24 hours.    Special instructions:   DO NOT smoke tobacco or vape for 24 hours before your procedure.  Please read over the following fact sheets that you were given. Anesthesia Post-op Instructions and Care and Recovery After Surgery      Colonoscopy, Adult, Care After The following information offers guidance on how to care for yourself after your procedure. Your health care provider may also give you more specific instructions. If you have problems or  questions, contact your health care provider. What can I expect after the procedure? After the procedure, it is common to have: A small amount of blood in your stool for 24 hours after the procedure. Some gas. Mild cramping or bloating of your abdomen. Follow these instructions at home: Eating and drinking  Drink enough fluid to keep your urine pale yellow. Follow instructions from your health care provider about eating or drinking restrictions. Resume your normal diet as told by your health care provider. Avoid heavy or fried foods that are hard to digest. Activity Rest as told by your health care provider. Avoid sitting for a long time without moving. Get up to take short walks every 1-2 hours. This is important to improve blood flow and breathing. Ask for help if you feel weak or unsteady. Return to your normal activities as told by your health care provider. Ask your health care provider what activities are safe for you. Managing cramping and bloating  Try walking around when you have cramps or feel bloated. If directed, apply heat to your abdomen as told by your health care provider. Use the  heat source that your health care provider recommends, such as a moist heat pack or a heating pad. Place a towel between your skin and the heat source. Leave the heat on for 20-30 minutes. Remove the heat if your skin turns bright red. This is especially important if you are unable to feel pain, heat, or cold. You have a greater risk of getting burned. General instructions If you were given a sedative during the procedure, it can affect you for several hours. Do not drive or operate machinery until your health care provider says that it is safe. For the first 24 hours after the procedure: Do not sign important documents. Do not drink alcohol. Do your regular daily activities at a slower pace than normal. Eat soft foods that are easy to digest. Take over-the-counter and prescription medicines  only as told by your health care provider. Keep all follow-up visits. This is important. Contact a health care provider if: You have blood in your stool 2-3 days after the procedure. Get help right away if: You have more than a small spotting of blood in your stool. You have large blood clots in your stool. You have swelling of your abdomen. You have nausea or vomiting. You have a fever. You have increasing pain in your abdomen that is not relieved with medicine. These symptoms may be an emergency. Get help right away. Call 911. Do not wait to see if the symptoms will go away. Do not drive yourself to the hospital. Summary After the procedure, it is common to have a small amount of blood in your stool. You may also have mild cramping and bloating of your abdomen. If you were given a sedative during the procedure, it can affect you for several hours. Do not drive or operate machinery until your health care provider says that it is safe. Get help right away if you have a lot of blood in your stool, nausea or vomiting, a fever, or increased pain in your abdomen. This information is not intended to replace advice given to you by your health care provider. Make sure you discuss any questions you have with your health care provider. Document Revised: 04/08/2021 Document Reviewed: 04/08/2021 Elsevier Patient Education  2023 Else[employer] Inc. Monitored Anesthesia Care, Care After The following information offers guidance on how to care for yourself after your procedure. Your health care provider may also give you more specific instructions. If you have problems or questions, contact your health care provider. What can I expect after the procedure? After the procedure, it is common to have: Tiredness. Little or no memory about what happened during or after the procedure. Impaired judgment when it comes to making decisions. Nausea or vomiting. Some trouble with balance. Follow these instructions at  home: For the time period you were told by your health care provider:  Rest. Do not participate in activities where you could fall or become injured. Do not drive or use machinery. Do not drink alcohol. Do not take sleeping pills or medicines that cause drowsiness. Do not make important decisions or sign legal documents. Do not take care of children on your own. Medicines Take over-the-counter and prescription medicines only as told by your health care provider. If you were prescribed antibiotics, take them as told by your health care provider. Do not stop using the antibiotic even if you start to feel better. Eating and drinking Follow instructions from your health care provider about what you may eat and drink. Drink enough fluid to  keep your urine pale yellow. If you vomit: Drink clear fluids slowly and in small amounts as you are able. Clear fluids include water, ice chips, low-calorie sports drinks, and fruit juice that has water added to it (diluted fruit juice). Eat light and bland foods in small amounts as you are able. These foods include bananas, applesauce, rice, lean meats, toast, and crackers. General instructions  Have a responsible adult stay with you for the time you are told. It is important to have someone help care for you until you are awake and alert. If you have sleep apnea, surgery and some medicines can increase your risk for breathing problems. Follow instructions from your health care provider about wearing your sleep device: When you are sleeping. This includes during daytime naps. While taking prescription pain medicines, sleeping medicines, or medicines that make you drowsy. Do not use any products that contain nicotine or tobacco. These products include cigarettes, chewing tobacco, and vaping devices, such as e-cigarettes. If you need help quitting, ask your health care provider. Contact a health care provider if: You feel nauseous or vomit every time you eat  or drink. You feel light-headed. You are still sleepy or having trouble with balance after 24 hours. You get a rash. You have a fever. You have redness or swelling around the IV site. Get help right away if: You have trouble breathing. You have new confusion after you get home. These symptoms may be an emergency. Get help right away. Call 911. Do not wait to see if the symptoms will go away. Do not drive yourself to the hospital. This information is not intended to replace advice given to you by your health care provider. Make sure you discuss any questions you have with your health care provider. Document Revised: 01/11/2022 Document Reviewed: 01/11/2022 Elsevier Patient Education  2023 [employer].

## 2023-01-12 ENCOUNTER — Encounter (HOSPITAL_COMMUNITY): Payer: Self-pay

## 2023-01-12 ENCOUNTER — Encounter (HOSPITAL_COMMUNITY)
Admission: RE | Admit: 2023-01-12 | Discharge: 2023-01-12 | Disposition: A | Discharge status: Home / Self Care | Payer: Medicare HMO | Source: Ambulatory Visit | Attending: Internal Medicine | Admitting: Internal Medicine

## 2023-01-12 DIAGNOSIS — E1140 Type 2 diabetes mellitus with diabetic neuropathy, unspecified: Secondary | ICD-10-CM | POA: Diagnosis not present

## 2023-01-12 DIAGNOSIS — Z01812 Encounter for preprocedural laboratory examination: Secondary | ICD-10-CM | POA: Insufficient documentation

## 2023-01-12 HISTORY — DX: Tachycardia, unspecified: R00.0

## 2023-01-12 HISTORY — DX: Cardiac arrhythmia, unspecified: I49.9

## 2023-01-12 LAB — BASIC METABOLIC PANEL
Anion gap: 6 (ref 5–15)
BUN: 19 mg/dL (ref 8–23)
CO2: 28 mmol/L (ref 22–32)
Calcium: 9 mg/dL (ref 8.9–10.3)
Chloride: 105 mmol/L (ref 98–111)
Creatinine, Ser: 0.94 mg/dL (ref 0.44–1.00)
GFR, Estimated: >60 mL/min (ref >60)
Glucose, Bld: 125 mg/dL — ABNORMAL HIGH (ref 70–99)
Potassium: 4.2 mmol/L (ref 3.5–5.1)
Sodium: 139 mmol/L (ref 135–145)

## 2023-01-13 ENCOUNTER — Ambulatory Visit: Payer: Medicare HMO | Admitting: Podiatry

## 2023-01-13 DIAGNOSIS — R609 Edema, unspecified: Secondary | ICD-10-CM

## 2023-01-13 DIAGNOSIS — M79674 Pain in right toe(s): Secondary | ICD-10-CM | POA: Diagnosis not present

## 2023-01-13 DIAGNOSIS — M7752 Other enthesopathy of left foot: Secondary | ICD-10-CM

## 2023-01-13 DIAGNOSIS — M65271 Calcific tendinitis, right ankle and foot: Secondary | ICD-10-CM

## 2023-01-13 DIAGNOSIS — M7750 Other enthesopathy of unspecified foot: Secondary | ICD-10-CM

## 2023-01-13 DIAGNOSIS — M778 Other enthesopathies, not elsewhere classified: Secondary | ICD-10-CM

## 2023-01-13 DIAGNOSIS — M7751 Other enthesopathy of right foot: Secondary | ICD-10-CM | POA: Diagnosis not present

## 2023-01-13 DIAGNOSIS — B351 Tinea unguium: Secondary | ICD-10-CM

## 2023-01-13 DIAGNOSIS — M79675 Pain in left toe(s): Secondary | ICD-10-CM | POA: Diagnosis not present

## 2023-01-13 NOTE — Progress Notes (Signed)
Subjective: Chief Complaint  Patient presents with   Foot Pain    Rm 13 Follow uo left foot pain. Pt states improvement until she goes to therapy. Pt requesting an injection and a compression stocking recommended by PT.   Nail Problem    Rfc bilateral naii trim requested by pt.    77 year old female presents the above concerns.  She is requesting steroid injection.  Overall she is feeling better she is having discomfort and swelling he should proceed with an injection as well as compression sock.  Also she needs her nails trimmed to the thickened elongated she has difficulty trimming herself and are causing pain.  Objective: AAO x3, NAD DP/PT pulses palpable bilaterally, CRT less than 3 seconds Nails are hypertrophic, dystrophic, brittle, discolored, elongated 10. No surrounding redness or drainage. Tenderness nails 1-5 bilaterally. No open lesions or pre-ulcerative lesions are identified today. Tenderness palpation along the sinus tarsi left ankle there is localized edema however there is no erythema or warmth associated.  There is no area pinpoint tenderness.  She also gets tenderness in the second interspace.  Not able to palpate any area of neuroma.  No area pinpoint tenderness.  Flexor, extensor tendons appear to be intact.  MMT 5/5. No pain with calf compression, swelling, warmth, erythema  Assessment: 77 year old female with symptomatic onychosis, capsulitis left ankle, tenderness right.  Plan: -All treatment options discussed with the patient including all alternatives, risks, complications.  -Sharply debrided the toenails x 10 without any complications or bleeding. -She was proceed with a steroid injection to the left sinus tarsi as well as the right second interspace.  Skin was prepared, alcohol, mixture of 1 cc Kenalog 10, 0.5 cc Marcaine plain, 0.5 mL's of lidocaine plain was infiltrated into the sinus tarsi without complications.  Postinjection care was discussed.  Tolerated  well. -Compression anklet dispensed -Patient encouraged to call the office with any questions, concerns, change in symptoms.

## 2023-01-17 ENCOUNTER — Encounter (HOSPITAL_COMMUNITY): Payer: Self-pay

## 2023-01-17 ENCOUNTER — Ambulatory Visit (HOSPITAL_BASED_OUTPATIENT_CLINIC_OR_DEPARTMENT_OTHER): Payer: Medicare HMO | Admitting: Anesthesiology

## 2023-01-17 ENCOUNTER — Encounter (HOSPITAL_COMMUNITY)
Admission: RE | Disposition: A | Discharge status: Home / Self Care | Payer: Self-pay | Source: Home / Self Care | Attending: Internal Medicine

## 2023-01-17 ENCOUNTER — Ambulatory Visit (HOSPITAL_COMMUNITY)
Admission: RE | Admit: 2023-01-17 | Discharge: 2023-01-17 | Disposition: A | Discharge status: Home / Self Care | Payer: Medicare HMO | Attending: Internal Medicine | Admitting: Internal Medicine

## 2023-01-17 ENCOUNTER — Ambulatory Visit (HOSPITAL_COMMUNITY): Payer: Medicare HMO | Admitting: Anesthesiology

## 2023-01-17 DIAGNOSIS — K635 Polyp of colon: Secondary | ICD-10-CM | POA: Diagnosis not present

## 2023-01-17 DIAGNOSIS — K648 Other hemorrhoids: Secondary | ICD-10-CM

## 2023-01-17 DIAGNOSIS — I10 Essential (primary) hypertension: Secondary | ICD-10-CM | POA: Diagnosis not present

## 2023-01-17 DIAGNOSIS — K5730 Diverticulosis of large intestine without perforation or abscess without bleeding: Secondary | ICD-10-CM | POA: Diagnosis not present

## 2023-01-17 DIAGNOSIS — Z7984 Long term (current) use of oral hypoglycemic drugs: Secondary | ICD-10-CM | POA: Insufficient documentation

## 2023-01-17 DIAGNOSIS — M1990 Unspecified osteoarthritis, unspecified site: Secondary | ICD-10-CM | POA: Diagnosis not present

## 2023-01-17 DIAGNOSIS — E1151 Type 2 diabetes mellitus with diabetic peripheral angiopathy without gangrene: Secondary | ICD-10-CM | POA: Insufficient documentation

## 2023-01-17 DIAGNOSIS — F32A Depression, unspecified: Secondary | ICD-10-CM | POA: Insufficient documentation

## 2023-01-17 DIAGNOSIS — D126 Benign neoplasm of colon, unspecified: Secondary | ICD-10-CM | POA: Diagnosis not present

## 2023-01-17 DIAGNOSIS — Z8711 Personal history of peptic ulcer disease: Secondary | ICD-10-CM | POA: Diagnosis not present

## 2023-01-17 DIAGNOSIS — K5732 Diverticulitis of large intestine without perforation or abscess without bleeding: Secondary | ICD-10-CM

## 2023-01-17 DIAGNOSIS — K219 Gastro-esophageal reflux disease without esophagitis: Secondary | ICD-10-CM | POA: Insufficient documentation

## 2023-01-17 DIAGNOSIS — E119 Type 2 diabetes mellitus without complications: Secondary | ICD-10-CM | POA: Diagnosis not present

## 2023-01-17 DIAGNOSIS — D122 Benign neoplasm of ascending colon: Secondary | ICD-10-CM | POA: Diagnosis not present

## 2023-01-17 DIAGNOSIS — R933 Abnormal findings on diagnostic imaging of other parts of digestive tract: Secondary | ICD-10-CM

## 2023-01-17 HISTORY — PX: POLYPECTOMY: SHX5525

## 2023-01-17 HISTORY — PX: COLONOSCOPY WITH PROPOFOL: SHX5780

## 2023-01-17 LAB — GLUCOSE, CAPILLARY: Glucose-Capillary: 105 mg/dL — ABNORMAL HIGH (ref 70–99)

## 2023-01-17 SURGERY — COLONOSCOPY WITH PROPOFOL
Anesthesia: General

## 2023-01-17 MED ORDER — LACTATED RINGERS IV SOLN: INTRAVENOUS | Status: DC

## 2023-01-17 MED ORDER — GLYCOPYRROLATE 0.2 MG/ML IJ SOLN
INTRAMUSCULAR | Status: DC | PRN
  Administered 2023-01-17: .2 mg via INTRAVENOUS

## 2023-01-17 MED ORDER — STERILE WATER FOR IRRIGATION IR SOLN
Status: DC | PRN
  Administered 2023-01-17: 100 mL

## 2023-01-17 MED ORDER — LIDOCAINE HCL (CARDIAC) PF 100 MG/5ML IV SOSY
PREFILLED_SYRINGE | INTRAVENOUS | Status: DC | PRN
  Administered 2023-01-17: 60 mg via INTRATRACHEAL

## 2023-01-17 MED ORDER — LACTATED RINGERS IV SOLN: INTRAVENOUS | Status: DC | PRN

## 2023-01-17 MED ORDER — PROPOFOL 500 MG/50ML IV EMUL
INTRAVENOUS | Status: DC | PRN
  Administered 2023-01-17: 150 ug/kg/min via INTRAVENOUS

## 2023-01-17 MED ORDER — PROPOFOL 10 MG/ML IV BOLUS
INTRAVENOUS | Status: DC | PRN
  Administered 2023-01-17: 80 mg via INTRAVENOUS

## 2023-01-17 NOTE — Discharge Instructions (Addendum)
  Colonoscopy Discharge Instructions  Read the instructions outlined below and refer to this sheet in the next few weeks. These discharge instructions provide you with general information on caring for yourself after you leave the hospital. Your doctor may also give you specific instructions. While your treatment has been planned according to the most current medical practices available, unavoidable complications occasionally occur.   ACTIVITY You may resume your regular activity, but move at a slower pace for the next 24 hours.  Take frequent rest periods for the next 24 hours.  Walking will help get rid of the air and reduce the bloated feeling in your belly (abdomen).  No driving for 24 hours (because of the medicine (anesthesia) used during the test).   Do not sign any important legal documents or operate any machinery for 24 hours (because of the anesthesia used during the test).  NUTRITION Drink plenty of fluids.  You may resume your normal diet as instructed by your doctor.  Begin with a light meal and progress to your normal diet. Heavy or fried foods are harder to digest and may make you feel sick to your stomach (nauseated).  Avoid alcoholic beverages for 24 hours or as instructed.  MEDICATIONS You may resume your normal medications unless your doctor tells you otherwise.  WHAT YOU CAN EXPECT TODAY Some feelings of bloating in the abdomen.  Passage of more gas than usual.  Spotting of blood in your stool or on the toilet paper.  IF YOU HAD POLYPS REMOVED DURING THE COLONOSCOPY: No aspirin products for 7 days or as instructed.  No alcohol for 7 days or as instructed.  Eat a soft diet for the next 24 hours.  FINDING OUT THE RESULTS OF YOUR TEST Not all test results are available during your visit. If your test results are not back during the visit, make an appointment with your caregiver to find out the results. Do not assume everything is normal if you have not heard from your  caregiver or the medical facility. It is important for you to follow up on all of your test results.  SEEK IMMEDIATE MEDICAL ATTENTION IF: You have more than a spotting of blood in your stool.  Your belly is swollen (abdominal distention).  You are nauseated or vomiting.  You have a temperature over 101.  You have abdominal pain or discomfort that is severe or gets worse throughout the day.   Unfortunately, your colon was not adequately prepped today for colonoscopy.  I did not see any evidence of colon cancer or large polyps, but certainly could have missed smaller polyps due to poor visualization.  Would consider repeat colonoscopy in 6 months with a different colon prep.  I did remove 1 small polyp.  Await pathology results, my office will contact you.  You also have diverticulosis and internal hemorrhoids. I would recommend increasing fiber in your diet or adding OTC Benefiber/Metamucil. Be sure to drink at least 4 to 6 glasses of water daily. Follow-up with GI in 2-3 months    I hope you have a great rest of your week!  Hennie Duos. Marletta Lor, D.O. Gastroenterology and Hepatology [facility]

## 2023-01-17 NOTE — Anesthesia Preprocedure Evaluation (Signed)
Anesthesia Evaluation  Patient identified by MRN, date of birth, ID band Patient awake    Reviewed: Allergy & Precautions, H&P , NPO status , Patient's Chart, lab work & pertinent test results  Airway Mallampati: II  TM Distance: >3 FB Neck ROM: Full    Dental  (+) Missing, Dental Advisory Given   Pulmonary neg pulmonary ROS   Pulmonary exam normal breath sounds clear to auscultation       Cardiovascular hypertension, Pt. on medications + Peripheral Vascular Disease  Normal cardiovascular exam+ dysrhythmias  Rhythm:Regular Rate:Normal     Neuro/Psych  PSYCHIATRIC DISORDERS  Depression     Neuromuscular disease    GI/Hepatic Neg liver ROS, PUD,GERD  Medicated and Controlled,,  Endo/Other  diabetes, Well Controlled, Type 2, Oral Hypoglycemic Agents    Renal/GU negative Renal ROS  negative genitourinary   Musculoskeletal  (+) Arthritis , Osteoarthritis,    Abdominal   Peds negative pediatric ROS (+)  Hematology negative hematology ROS (+)   Anesthesia Other Findings   Reproductive/Obstetrics negative OB ROS                             Anesthesia Physical Anesthesia Plan  ASA: 3  Anesthesia Plan: General   Post-op Pain Management: Minimal or no pain anticipated   Induction: Intravenous  PONV Risk Score and Plan: Treatment may vary due to age or medical condition and Propofol infusion  Airway Management Planned: Natural Airway and Nasal Cannula  Additional Equipment:   Intra-op Plan:   Post-operative Plan:   Informed Consent: I have reviewed the patients History and Physical, chart, labs and discussed the procedure including the risks, benefits and alternatives for the proposed anesthesia with the patient or authorized representative who has indicated his/her understanding and acceptance.     Dental advisory given  Plan Discussed with: CRNA and Surgeon  Anesthesia Plan  Comments:        Anesthesia Quick Evaluation

## 2023-01-17 NOTE — Transfer of Care (Signed)
Immediate Anesthesia Transfer of Care Note  Patient: [name]  Procedure(s) Performed: COLONOSCOPY WITH PROPOFOL POLYPECTOMY  Patient Location: PACU and Endoscopy Unit  Anesthesia Type:General  Level of Consciousness: drowsy  Airway & Oxygen Therapy: Patient Spontanous Breathing  Post-op Assessment: Report given to RN and Post -op Vital signs reviewed and stable  Post vital signs: Reviewed and stable  Last Vitals:  Vitals Value Taken Time  BP 100/37 01/17/23 1044  Temp 37 C 01/17/23 1044  Pulse 61 01/17/23 1044  Resp 22 01/17/23 1044  SpO2 96 % 01/17/23 1044    Last Pain:  Vitals:   01/17/23 1025  TempSrc:   PainSc: 0-No pain         Complications: No notable events documented.

## 2023-01-17 NOTE — Anesthesia Postprocedure Evaluation (Signed)
Anesthesia Post Note  Patient: [name]  Procedure(s) Performed: COLONOSCOPY WITH PROPOFOL POLYPECTOMY  Patient location during evaluation: Phase II Anesthesia Type: General Level of consciousness: awake and alert and oriented Pain management: pain level controlled Vital Signs Assessment: post-procedure vital signs reviewed and stable Respiratory status: spontaneous breathing, nonlabored ventilation and respiratory function stable Cardiovascular status: blood pressure returned to baseline and stable Postop Assessment: no apparent nausea or vomiting Anesthetic complications: no  No notable events documented.   Last Vitals:  Vitals:   01/17/23 1044 01/17/23 1049  BP: (!) 100/37 (!) 112/50  Pulse: 61   Resp: (!) 22   Temp: 37 C   SpO2: 96%     Last Pain:  Vitals:   01/17/23 1049  TempSrc:   PainSc: 0-No pain                 Jonathen Rathman C Bobi Daudelin

## 2023-01-17 NOTE — H&P (Signed)
Primary Care Physician:  Babs Sciara, MD Primary Gastroenterologist:  Dr. Marletta Lor  Pre-Procedure History & Physical: HPI:  [name] is a 77 y.o. female is here for a colonoscopy to be performed for follow-up of diverticulitis.  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 mild CAD by cath in 2005 and 2012 b. low-risk NST's in 07/2016 and 05/2019   Cyst on liver   Depression    Diabetes mellitus    controlled by diet   Diverticulitis    DVT of axillary vein, acute right (HCC) 11/24/2012   Dx on Jan 28, 2012.     Dysrhythmia    Fibrocystic breast disease    Hepatic cyst    Hypertension    IBS (irritable bowel syndrome)    Impaired glucose tolerance    Menopause    Tachycardia     Past Surgical History:  Procedure Laterality Date   ABDOMINAL HYSTERECTOMY     BACK SURGERY     blood clot Right 2014   arm   BREAST SURGERY  right   cyst removed   CARDIAC CATHETERIZATION  2010   with stent   CAST APPLICATION  02/14/2012   Procedure: CAST APPLICATION;  Surgeon: Vickki Hearing, MD;  Location: [facility];  Service: Orthopedics;  Laterality: Right;  procedure room    CHOLECYSTECTOMY     COLONOSCOPY  12/04/2004   NUR:Few small diverticula at sigmoid colon/Small cecal polyp ablated    COLONOSCOPY N/A 09/24/2014   SLF: small internal hemorrhoids   COLONOSCOPY N/A 02/15/2020   Procedure: COLONOSCOPY;  Surgeon: Corbin Ade, MD;  Location: [facility];  Service: Endoscopy;  Laterality: N/A;  8:30am   ESOPHAGOGASTRODUODENOSCOPY N/A 02/02/2016   Procedure: ESOPHAGOGASTRODUODENOSCOPY (EGD);  Surgeon: West Bali, MD;  Location: [facility];  Service: Endoscopy;  Laterality: N/A;  930    EXTERNAL FIXATION ANKLE FRACTURE     KNEE ARTHROCENTESIS  left   POLYPECTOMY  02/15/2020   Procedure: POLYPECTOMY;  Surgeon: Corbin Ade, MD;  Location: [facility];  Service: Endoscopy;;   TUBAL LIGATION       Prior to Admission medications   Medication Sig Start Date End Date Taking? Authorizing Provider  acetaminophen (TYLENOL) 500 MG tablet Take 1,000 mg by mouth every 6 (six) hours as needed for mild pain, moderate pain or headache.    Yes [provider]  Blood Glucose Monitoring Suppl (PRODIGY AUTOCODE BLOOD GLUCOSE) w/Device KIT USE AS DIRECTED 07/06/22  Yes Luking, Jonna Coup, MD  clopidogrel (PLAVIX) 75 MG tablet TAKE 1 TABLET EVERY DAY 11/19/22  Yes Luking, Scott A, MD  diclofenac Sodium (VOLTAREN) 1 % GEL Apply 2 g topically 4 (four) times daily. 03/20/21  Yes Park Liter, DPM  diclofenac Sodium (VOLTAREN) 1 % GEL Apply 2 g topically 4 (four) times daily. Rub into affected area of foot 2 to 4 times daily 11/25/22  Yes Vivi Barrack, DPM  glucose blood (PRODIGY NO CODING BLOOD GLUC) test strip Use as instructed 12/13/22  Yes Cook, Jayce G, DO  latanoprost (XALATAN) 0.005 % ophthalmic solution 1 drop at bedtime. 09/20/22  Yes [provider]  linaclotide (LINZESS) 145 MCG CAPS capsule Take 1 capsule (145 mcg total) by mouth daily. On empty stomach 11/30/22  Yes Tiffany Kocher, PA-C  losartan (COZAAR) 25 MG tablet TAKE 1 TABLET EVERY DAY 12/23/22  Yes Luking,  Jonna Coup, MD  Multiple Vitamins-Minerals (MULTIVITAMIN WITH MINERALS) tablet Take 1 tablet by mouth daily.   Yes [provider]  Na Sulfate-K Sulfate-Mg Sulf 17.5-3.13-1.6 GM/177ML SOLN As directed 12/14/22  Yes Cledis Sohn K, DO  pantoprazole (PROTONIX) 40 MG tablet TAKE 1 TABLET EVERY DAY 10/19/22  Yes Luking, Scott A, MD  pravastatin (PRAVACHOL) 20 MG tablet TAKE 1 TABLET EVERY EVENING ( DOSE DECREASE ) 12/13/22  Yes Babs Sciara, MD  Prodigy Twist Top Lancets 28G MISC Use as directed 12/16/22  Yes Luking, Scott A, MD  sitaGLIPtin (JANUVIA) 50 MG tablet Take 1 tablet (50 mg total) by mouth daily. 12/09/22  Yes Babs Sciara, MD    Allergies as of 12/14/2022 - Review Complete 11/30/2022  Allergen  Reaction Noted   Atorvastatin  06/30/2022   Ibuprofen  05/19/2021   Norvasc [amlodipine besylate] Other (See Comments) 11/21/2012   Penicillins Hives and Other (See Comments)    Advair diskus [fluticasone-salmeterol] Palpitations 11/21/2012   Metformin and related Other (See Comments) 09/28/2013    Family History  Problem Relation Age of Onset   Stroke Mother    Heart attack Mother    Cancer Sister    Diabetes Sister    Seizures Brother    Diabetes Brother    Heart disease Brother    Kidney disease Son    Colon cancer Neg Hx     Social History   Socioeconomic History   Marital status: Widowed    Spouse name: Not on file   Number of children: 2   Years of education: Not on file   Highest education level: Not on file  Occupational History   Occupation: Retired  Tobacco Use   Smoking status: Never   Smokeless tobacco: Never   Tobacco comments:    Never smoked  Vaping Use   Vaping Use: Never used  Substance and Sexual Activity   Alcohol use: No    Alcohol/week: 0.0 standard drinks of alcohol   Drug use: No   Sexual activity: Yes  Other Topics Concern   Not on file  Social History Narrative   1 son and 1 daughter live close by.   Social Determinants of Health   Financial Resource Strain: Low Risk  (06/02/2021)   Overall Financial Resource Strain (CARDIA)    Difficulty of Paying Living Expenses: Not hard at all  Food Insecurity: No Food Insecurity (06/02/2021)   Hunger Vital Sign    Worried About Running Out of Food in the Last Year: Never true    Ran Out of Food in the Last Year: Never true  Transportation Needs: No Transportation Needs (06/02/2021)   PRAPARE - Administrator, Civil Service (Medical): No    Lack of Transportation (Non-Medical): No  Physical Activity: Insufficiently Active (06/02/2021)   Exercise Vital Sign    Days of Exercise per Week: 2 days    Minutes of Exercise per Session: 30 min  Stress: No Stress Concern Present (06/02/2021)    [employer] of Occupational Health - Occupational Stress Questionnaire    Feeling of Stress : Not at all  Social Connections: Moderately Integrated (06/02/2021)   Social Connection and Isolation Panel [NHANES]    Frequency of Communication with Friends and Family: More than three times a week    Frequency of Social Gatherings with Friends and Family: More than three times a week    Attends Religious Services: More than 4 times per year    Active Member of  Clubs or Organizations: Yes    Attends Banker Meetings: More than 4 times per year    Marital Status: Widowed  Intimate Partner Violence: Not At Risk (06/02/2021)   Humiliation, Afraid, Rape, and Kick questionnaire    Fear of Current or Ex-Partner: No    Emotionally Abused: No    Physically Abused: No    Sexually Abused: No    Review of Systems: See HPI, otherwise negative ROS  Physical Exam: Vital signs in last 24 hours: Temp:  [97.6 F (36.4 C)] 97.6 F (36.4 C) (05/20 0944) Pulse Rate:  [49] 49 (05/20 0944) Resp:  [18] 18 (05/20 0944) BP: (172)/(43) 172/43 (05/20 0944) SpO2:  [100 %] 100 % (05/20 0944) Weight:  [108.9 kg] 108.9 kg (05/20 0944)   General:   Alert,  Well-developed, well-nourished, pleasant and cooperative in NAD Head:  Normocephalic and atraumatic. Eyes:  Sclera clear, no icterus.   Conjunctiva pink. Ears:  Normal auditory acuity. Nose:  No deformity, discharge,  or lesions. Msk:  Symmetrical without gross deformities. Normal posture. Extremities:  Without clubbing or edema. Neurologic:  Alert and  oriented x4;  grossly normal neurologically. Skin:  Intact without significant lesions or rashes. Psych:  Alert and cooperative. Normal mood and affect.  Impression/Plan: [name] is here for a colonoscopy to be performed for follow-up of diverticulitis.  The risks of the procedure including infection, bleed, or perforation as well as benefits, limitations, alternatives and  imponderables have been reviewed with the patient. Questions have been answered. All parties agreeable.

## 2023-01-17 NOTE — Op Note (Signed)
[facility] Patient Name: [name] Procedure Date: 01/17/2023 10:20 AM MRN: [id number] Date of Birth: 03/28/1946 Attending MD: Hennie Duos. Marletta Lor , [place], [id number] CSN: [id number] Age: 77 Admit Type: Outpatient Procedure:                Colonoscopy Indications:              Follow-up of diverticulitis Providers:                Hennie Duos. Marletta Lor, DO, Buel Ream. Thomasena Edis RN, RN,                            Lennice Sites Technician, Technician Referring MD:              Medicines:                See the Anesthesia note for documentation of the                            administered medications Complications:            No immediate complications. Estimated Blood Loss:     Estimated blood loss was minimal. Procedure:                Pre-Anesthesia Assessment:                           - The anesthesia plan was to use monitored                            anesthesia care (MAC).                           After obtaining informed consent, the colonoscope                            was passed under direct vision. Throughout the                            procedure, the patient's blood pressure, pulse, and                            oxygen saturations were monitored continuously. The                            PCF-HQ190L ([id number]) scope was introduced through                            the anus and advanced to the the cecum, identified                            by appendiceal orifice and ileocecal valve. The                            colonoscopy was performed without difficulty. The                            patient tolerated the procedure  well. The quality                            of the bowel preparation was evaluated using the                            BBPS [facility] Bowel Preparation Scale) with scores                            of: Right Colon = 1 (portion of mucosa seen, but                            other areas not well seen due to staining, residual                             stool and/or opaque liquid), Transverse Colon = 2                            (minor amount of residual staining, small fragments                            of stool and/or opaque liquid, but mucosa seen                            well) and Left Colon = 2 (minor amount of residual                            staining, small fragments of stool and/or opaque                            liquid, but mucosa seen well). The total BBPS score                            equals 5. The quality of the bowel preparation was                            inadequate. Scope In: 10:28:04 AM Scope Out: 10:40:07 AM Scope Withdrawal Time: 0 hours 8 minutes 12 seconds  Total Procedure Duration: 0 hours 12 minutes 3 seconds  Findings:      Non-bleeding internal hemorrhoids were found during endoscopy.      Many large-mouthed and small-mouthed diverticula were found in the       sigmoid colon, descending colon and transverse colon.      A 5 mm polyp was found in the ascending colon. The polyp was sessile.       The polyp was removed with a cold snare. Resection and retrieval were       complete.      Extensive amounts of semi-solid stool was found in the proximal       transverse colon, in the ascending colon and in the cecum, precluding       visualization. Lavage of the area was performed using copious amounts of       sterile water, resulting in incomplete clearance with continued poor  visualization. Impression:               - Preparation of the colon was inadequate.                           - Non-bleeding internal hemorrhoids.                           - Diverticulosis in the sigmoid colon, in the                            descending colon and in the transverse colon.                           - One 5 mm polyp in the ascending colon, removed                            with a cold snare. Resected and retrieved.                           - Stool in the proximal transverse colon, in the                             ascending colon and in the cecum. Moderate Sedation:      Per Anesthesia Care Recommendation:           - Patient has a contact number available for                            emergencies. The signs and symptoms of potential                            delayed complications were discussed with the                            patient. Return to normal activities tomorrow.                            Written discharge instructions were provided to the                            patient.                           - Resume previous diet.                           - Continue present medications.                           - Await pathology results.                           - Consider repeat colonoscopy in 6 months because  the bowel preparation was poor.                           - Return to GI clinic in 3 months. Procedure Code(s):        --- Professional ---                           [id number], Colonoscopy, flexible; with removal of                            tumor(s), polyp(s), or other lesion(s) by snare                            technique Diagnosis Code(s):        --- Professional ---                           K64.8, Other hemorrhoids                           D12.2, Benign neoplasm of ascending colon                           K57.32, Diverticulitis of large intestine without                            perforation or abscess without bleeding                           K57.30, Diverticulosis of large intestine without                            perforation or abscess without bleeding CPT copyright 2022 American Medical Association. All rights reserved. The codes documented in this report are preliminary and upon coder review may  be revised to meet current compliance requirements. Hennie Duos. Marletta Lor, DO Hennie Duos. Marletta Lor, DO 01/17/2023 10:44:41 AM This report has been signed electronically. Number of Addenda: 0

## 2023-01-19 LAB — SURGICAL PATHOLOGY

## 2023-01-20 ENCOUNTER — Encounter (HOSPITAL_COMMUNITY): Payer: Self-pay | Admitting: Internal Medicine

## 2023-01-31 DIAGNOSIS — M25572 Pain in left ankle and joints of left foot: Secondary | ICD-10-CM | POA: Diagnosis not present

## 2023-01-31 DIAGNOSIS — M62572 Muscle wasting and atrophy, not elsewhere classified, left ankle and foot: Secondary | ICD-10-CM | POA: Diagnosis not present

## 2023-01-31 DIAGNOSIS — M25672 Stiffness of left ankle, not elsewhere classified: Secondary | ICD-10-CM | POA: Diagnosis not present

## 2023-01-31 DIAGNOSIS — R2681 Unsteadiness on feet: Secondary | ICD-10-CM | POA: Diagnosis not present

## 2023-02-08 DIAGNOSIS — H4010X2 Unspecified open-angle glaucoma, moderate stage: Secondary | ICD-10-CM | POA: Diagnosis not present

## 2023-02-10 ENCOUNTER — Encounter: Payer: Self-pay | Admitting: Internal Medicine

## 2023-02-10 DIAGNOSIS — M25572 Pain in left ankle and joints of left foot: Secondary | ICD-10-CM | POA: Diagnosis not present

## 2023-02-10 DIAGNOSIS — M62572 Muscle wasting and atrophy, not elsewhere classified, left ankle and foot: Secondary | ICD-10-CM | POA: Diagnosis not present

## 2023-02-10 DIAGNOSIS — M25672 Stiffness of left ankle, not elsewhere classified: Secondary | ICD-10-CM | POA: Diagnosis not present

## 2023-02-10 DIAGNOSIS — R2681 Unsteadiness on feet: Secondary | ICD-10-CM | POA: Diagnosis not present

## 2023-02-16 ENCOUNTER — Other Ambulatory Visit: Payer: Self-pay | Admitting: Family Medicine

## 2023-02-20 ENCOUNTER — Other Ambulatory Visit: Payer: Self-pay | Admitting: Family Medicine

## 2023-02-21 DIAGNOSIS — S60011A Contusion of right thumb without damage to nail, initial encounter: Secondary | ICD-10-CM | POA: Diagnosis not present

## 2023-02-21 DIAGNOSIS — M79644 Pain in right finger(s): Secondary | ICD-10-CM | POA: Diagnosis not present

## 2023-02-24 ENCOUNTER — Telehealth: Payer: Self-pay | Admitting: Podiatry

## 2023-02-24 NOTE — Telephone Encounter (Signed)
Pt called and was scheduled to see Dr Ardelle Anton on 7.11 but is not able to make appt due to daughter is working and cannot bring pt. Pt requested appt to be changed to September or October. I r/s for 9.30

## 2023-02-28 ENCOUNTER — Other Ambulatory Visit: Payer: Self-pay | Admitting: *Deleted

## 2023-02-28 DIAGNOSIS — N2889 Other specified disorders of kidney and ureter: Secondary | ICD-10-CM

## 2023-02-28 DIAGNOSIS — K7689 Other specified diseases of liver: Secondary | ICD-10-CM

## 2023-02-28 DIAGNOSIS — K8689 Other specified diseases of pancreas: Secondary | ICD-10-CM

## 2023-03-08 ENCOUNTER — Other Ambulatory Visit: Payer: Self-pay | Admitting: Family Medicine

## 2023-03-08 ENCOUNTER — Ambulatory Visit (HOSPITAL_COMMUNITY)
Admission: RE | Admit: 2023-03-08 | Discharge: 2023-03-08 | Disposition: A | Discharge status: Home / Self Care | Payer: Medicare HMO | Source: Ambulatory Visit | Attending: Family Medicine | Admitting: Family Medicine

## 2023-03-08 DIAGNOSIS — Z9049 Acquired absence of other specified parts of digestive tract: Secondary | ICD-10-CM | POA: Diagnosis not present

## 2023-03-08 DIAGNOSIS — K760 Fatty (change of) liver, not elsewhere classified: Secondary | ICD-10-CM | POA: Diagnosis not present

## 2023-03-08 DIAGNOSIS — K8689 Other specified diseases of pancreas: Secondary | ICD-10-CM | POA: Insufficient documentation

## 2023-03-08 DIAGNOSIS — K7689 Other specified diseases of liver: Secondary | ICD-10-CM | POA: Diagnosis not present

## 2023-03-08 DIAGNOSIS — I700 Atherosclerosis of aorta: Secondary | ICD-10-CM | POA: Diagnosis not present

## 2023-03-08 DIAGNOSIS — N2889 Other specified disorders of kidney and ureter: Secondary | ICD-10-CM | POA: Insufficient documentation

## 2023-03-08 DIAGNOSIS — R932 Abnormal findings on diagnostic imaging of liver and biliary tract: Secondary | ICD-10-CM | POA: Diagnosis not present

## 2023-03-08 MED ORDER — GADOBUTROL 1 MMOL/ML IV SOLN
10.0000 mL | Freq: Once | INTRAVENOUS | Status: AC | PRN
  Administered 2023-03-08: 10 mL via INTRAVENOUS

## 2023-03-10 ENCOUNTER — Ambulatory Visit: Payer: Medicare HMO | Admitting: Podiatry

## 2023-03-11 DIAGNOSIS — E1140 Type 2 diabetes mellitus with diabetic neuropathy, unspecified: Secondary | ICD-10-CM | POA: Diagnosis not present

## 2023-03-11 DIAGNOSIS — E1169 Type 2 diabetes mellitus with other specified complication: Secondary | ICD-10-CM | POA: Diagnosis not present

## 2023-03-11 DIAGNOSIS — E785 Hyperlipidemia, unspecified: Secondary | ICD-10-CM | POA: Diagnosis not present

## 2023-03-12 ENCOUNTER — Encounter: Payer: Self-pay | Admitting: Family Medicine

## 2023-03-12 LAB — BASIC METABOLIC PANEL (7)
BUN/Creatinine Ratio: 15 (ref 12–28)
BUN: 15 mg/dL (ref 8–27)
CO2: 25 mmol/L (ref 20–29)
Chloride: 104 mmol/L (ref 96–106)
Creatinine, Ser: 0.98 mg/dL (ref 0.57–1.00)
Glucose: 116 mg/dL — ABNORMAL HIGH (ref 70–99)
Potassium: 4.7 mmol/L (ref 3.5–5.2)
Sodium: 141 mmol/L (ref 134–144)
eGFR: 60 mL/min/{1.73_m2} (ref >59)

## 2023-03-12 LAB — LIPID PANEL
Chol/HDL Ratio: 2.8 ratio (ref 0.0–4.4)
Cholesterol, Total: 137 mg/dL (ref 100–199)
HDL: 49 mg/dL (ref >39)
LDL Chol Calc (NIH): 73 mg/dL (ref 0–99)
Triglycerides: 74 mg/dL (ref 0–149)
VLDL Cholesterol Cal: 15 mg/dL (ref 5–40)

## 2023-03-12 LAB — HEMOGLOBIN A1C
Est. average glucose Bld gHb Est-mCnc: 157 mg/dL
Hgb A1c MFr Bld: 7.1 % — ABNORMAL HIGH (ref 4.8–5.6)

## 2023-03-12 NOTE — Progress Notes (Signed)
Please mail to patient

## 2023-03-14 ENCOUNTER — Other Ambulatory Visit: Payer: Self-pay | Admitting: Family Medicine

## 2023-03-17 ENCOUNTER — Telehealth: Payer: Self-pay

## 2023-03-17 NOTE — Telephone Encounter (Signed)
Called Pt to discuss results, no answer left Vm.

## 2023-03-23 ENCOUNTER — Other Ambulatory Visit: Payer: Self-pay

## 2023-03-23 ENCOUNTER — Encounter (HOSPITAL_COMMUNITY): Payer: Self-pay | Admitting: Emergency Medicine

## 2023-03-23 ENCOUNTER — Emergency Department (HOSPITAL_COMMUNITY): Payer: Medicare HMO

## 2023-03-23 ENCOUNTER — Emergency Department (HOSPITAL_COMMUNITY)
Admission: EM | Admit: 2023-03-23 | Discharge: 2023-03-23 | Disposition: A | Discharge status: Home / Self Care | Payer: Medicare HMO | Attending: Emergency Medicine | Admitting: Emergency Medicine

## 2023-03-23 DIAGNOSIS — B9789 Other viral agents as the cause of diseases classified elsewhere: Secondary | ICD-10-CM | POA: Diagnosis not present

## 2023-03-23 DIAGNOSIS — R079 Chest pain, unspecified: Secondary | ICD-10-CM | POA: Diagnosis not present

## 2023-03-23 DIAGNOSIS — I10 Essential (primary) hypertension: Secondary | ICD-10-CM | POA: Diagnosis not present

## 2023-03-23 DIAGNOSIS — J069 Acute upper respiratory infection, unspecified: Secondary | ICD-10-CM | POA: Insufficient documentation

## 2023-03-23 DIAGNOSIS — Z79899 Other long term (current) drug therapy: Secondary | ICD-10-CM | POA: Diagnosis not present

## 2023-03-23 DIAGNOSIS — R059 Cough, unspecified: Secondary | ICD-10-CM | POA: Diagnosis present

## 2023-03-23 DIAGNOSIS — Z7984 Long term (current) use of oral hypoglycemic drugs: Secondary | ICD-10-CM | POA: Diagnosis not present

## 2023-03-23 DIAGNOSIS — R0789 Other chest pain: Secondary | ICD-10-CM | POA: Diagnosis not present

## 2023-03-23 DIAGNOSIS — E119 Type 2 diabetes mellitus without complications: Secondary | ICD-10-CM | POA: Insufficient documentation

## 2023-03-23 LAB — CBC
HCT: 41.5 % (ref 36.0–46.0)
Hemoglobin: 13.3 g/dL (ref 12.0–15.0)
MCH: 25.3 pg — ABNORMAL LOW (ref 26.0–34.0)
MCHC: 32 g/dL (ref 30.0–36.0)
MCV: 78.9 fL — ABNORMAL LOW (ref 80.0–100.0)
Platelets: 304 10*3/uL (ref 150–400)
RBC: 5.26 MIL/uL — ABNORMAL HIGH (ref 3.87–5.11)
RDW: 16.8 % — ABNORMAL HIGH (ref 11.5–15.5)
WBC: 5.6 10*3/uL (ref 4.0–10.5)
nRBC: 0 % (ref 0.0–0.2)

## 2023-03-23 LAB — BASIC METABOLIC PANEL
Anion gap: 6 (ref 5–15)
BUN: 12 mg/dL (ref 8–23)
CO2: 27 mmol/L (ref 22–32)
Calcium: 9.4 mg/dL (ref 8.9–10.3)
Chloride: 104 mmol/L (ref 98–111)
Creatinine, Ser: 0.91 mg/dL (ref 0.44–1.00)
GFR, Estimated: >60 mL/min (ref >60)
Glucose, Bld: 123 mg/dL — ABNORMAL HIGH (ref 70–99)
Potassium: 4 mmol/L (ref 3.5–5.1)
Sodium: 137 mmol/L (ref 135–145)

## 2023-03-23 MED ORDER — BENZONATATE 100 MG PO CAPS: 100.0000 mg | ORAL_CAPSULE | Freq: Three times a day (TID) | ORAL | 0 refills | Status: DC

## 2023-03-23 MED ORDER — GUAIFENESIN ER 1200 MG PO TB12: 1.0000 | ORAL_TABLET | Freq: Two times a day (BID) | ORAL | 0 refills | Status: DC

## 2023-03-23 NOTE — ED Provider Notes (Signed)
[place] EMERGENCY DEPARTMENT AT [facility] Provider Note   CSN: [id number] Arrival date & time: 03/23/23  4098     History  Chief Complaint  Patient presents with   Cough    [name] is a 77 y.o. female.   Cough    Patient has a history of hypertension diverticulitis, diabetes irritable bowel syndrome DVT, depression, chest pain.  Patient states she started having symptoms on Monday.  She has noticed a rattling in her chest when she is coughing.  She is not having any fevers.  She is not having any shortness of breath.  She does have some mild discomfort in her chest with coughing.  She has not noticed any leg swelling.  No vomiting or diarrhea.  Home Medications Prior to Admission medications   Medication Sig Start Date End Date Taking? Authorizing Provider  benzonatate (TESSALON) 100 MG capsule Take 1 capsule (100 mg total) by mouth every 8 (eight) hours. 03/23/23  Yes Linwood Dibbles, MD  Guaifenesin 1200 MG TB12 Take 1 tablet (1,200 mg total) by mouth 2 (two) times daily at 10 AM and 5 PM. 03/23/23  Yes Linwood Dibbles, MD  acetaminophen (TYLENOL) 500 MG tablet Take 1,000 mg by mouth every 6 (six) hours as needed for mild pain, moderate pain or headache.     [provider]  Blood Glucose Monitoring Suppl (PRODIGY AUTOCODE BLOOD GLUCOSE) w/Device KIT USE AS DIRECTED 02/16/23   Babs Sciara, MD  clopidogrel (PLAVIX) 75 MG tablet TAKE 1 TABLET EVERY DAY 11/19/22   Babs Sciara, MD  diclofenac Sodium (VOLTAREN) 1 % GEL Apply 2 g topically 4 (four) times daily. 03/20/21   Park Liter, DPM  diclofenac Sodium (VOLTAREN) 1 % GEL Apply 2 g topically 4 (four) times daily. Rub into affected area of foot 2 to 4 times daily 11/25/22   Vivi Barrack, DPM  glucose blood (PRODIGY NO CODING BLOOD GLUC) test strip Use as instructed 12/13/22   Tommie Sams, DO  latanoprost (XALATAN) 0.005 % ophthalmic solution 1 drop at bedtime. 09/20/22   [provider]  linaclotide Karlene Einstein) 145 MCG CAPS capsule Take 1 capsule (145 mcg total) by mouth daily. On empty stomach 11/30/22   Tiffany Kocher, PA-C  losartan (COZAAR) 25 MG tablet TAKE 1 TABLET EVERY DAY 12/23/22   Babs Sciara, MD  Multiple Vitamins-Minerals (MULTIVITAMIN WITH MINERALS) tablet Take 1 tablet by mouth daily.    [provider]  pantoprazole (PROTONIX) 40 MG tablet TAKE 1 TABLET EVERY DAY 03/14/23   Babs Sciara, MD  pravastatin (PRAVACHOL) 20 MG tablet TAKE 1 TABLET EVERY EVENING ( DOSE DECREASE ) 02/21/23   Babs Sciara, MD  Prodigy Twist Top Lancets 28G MISC Use as directed 12/16/22   Babs Sciara, MD  sitaGLIPtin (JANUVIA) 50 MG tablet Take 1 tablet (50 mg total) by mouth daily. 12/09/22   Babs Sciara, MD      Allergies    Atorvastatin, Ibuprofen, Norvasc [amlodipine besylate], Penicillins, Advair diskus [fluticasone-salmeterol], and Metformin and related    Review of Systems   Review of Systems  Respiratory:  Positive for cough.     Physical Exam Updated Vital Signs BP (!) 160/41   Pulse 69   Temp 98.4 F (36.9 C) (Oral)   Resp (!) 23   SpO2 95%  Physical Exam Vitals and nursing note reviewed.  Constitutional:      General: She is not in acute  distress.    Appearance: She is well-developed.  HENT:     Head: Normocephalic and atraumatic.     Right Ear: External ear normal.     Left Ear: External ear normal.  Eyes:     General: No scleral icterus.       Right eye: No discharge.        Left eye: No discharge.     Conjunctiva/sclera: Conjunctivae normal.  Neck:     Trachea: No tracheal deviation.  Cardiovascular:     Rate and Rhythm: Normal rate. Rhythm irregular.  Pulmonary:     Effort: Pulmonary effort is normal. No respiratory distress.     Breath sounds: Normal breath sounds. No stridor. No wheezing or rales.  Abdominal:     General: Bowel sounds are normal. There is no distension.     Palpations: Abdomen is soft.     Tenderness:  There is no abdominal tenderness. There is no guarding or rebound.  Musculoskeletal:        General: No tenderness or deformity.     Cervical back: Neck supple.  Skin:    General: Skin is warm and dry.     Findings: No rash.  Neurological:     General: No focal deficit present.     Mental Status: She is alert.     Cranial Nerves: No cranial nerve deficit, dysarthria or facial asymmetry.     Sensory: No sensory deficit.     Motor: No abnormal muscle tone or seizure activity.     Coordination: Coordination normal.  Psychiatric:        Mood and Affect: Mood normal.     ED Results / Procedures / Treatments   Labs (all labs ordered are listed, but only abnormal results are displayed) Labs Reviewed  CBC - Abnormal; Notable for the following components:      Result Value   RBC 5.26 (*)    MCV 78.9 (*)    MCH 25.3 (*)    RDW 16.8 (*)    All other components within normal limits  BASIC METABOLIC PANEL - Abnormal; Notable for the following components:   Glucose, Bld 123 (*)    All other components within normal limits    EKG EKG Interpretation Date/Time:  Wednesday March 23 2023 09:55:53 EDT Ventricular Rate:  42 PR Interval:    QRS Duration:  136 QT Interval:  400 QTC Calculation: 335 R Axis:   44  Text Interpretation: atrial fibrillation Paired ventricular premature complexes Right bundle branch block   Confirmed by Linwood Dibbles [phone]) on 03/23/2023 10:03:27 AM  Radiology DG Chest 2 View  Result Date: 03/23/2023 CLINICAL DATA:  cp EXAM: CHEST - 2 VIEW COMPARISON:  03/13/2021. FINDINGS: Bilateral lung fields are clear. Bilateral costophrenic angles are clear. Normal cardio-mediastinal silhouette. No acute osseous abnormalities. The soft tissues are within normal limits. IMPRESSION: No active cardiopulmonary disease. Electronically Signed   By: Jules Schick M.D.   On: 03/23/2023 11:16    Procedures Procedures    Medications Ordered in ED Medications - No data to  display  ED Course/ Medical Decision Making/ A&P Clinical Course as of 03/23/23 1152  Wed Mar 23, 2023  1046 cBC metabolic panel unremarkable [JK]  1127 Cxr without acute findings [JK]    Clinical Course User Index [JK] Linwood Dibbles, MD  Medical Decision Making Problems Addressed: Viral URI with cough: acute illness or injury  Amount and/or Complexity of Data Reviewed Labs: ordered. Decision-making details documented in ED Course. Radiology: ordered and independent interpretation performed.  Risk OTC drugs. Prescription drug management.   Patient presented to ED with complaints of cough and congestion.  She also had some chest discomfort with coughing.  Patient's EKG does not show any ischemic changes.  Presentation more suggestive of infection.  Patient not having any respiratory difficulty.  Low suspicion for PE.  X-ray does not show evidence of pneumonia or pneumothorax.  Most likely viral etiology.  Patient does not have an oxygen requirement and she is breathing easily.  Will discharge home with symptomatic treatment.  Recommend outpatient follow-up with PCP.  Warning signs and precautions discussed.       Final Clinical Impression(s) / ED Diagnoses Final diagnoses:  Viral URI with cough    Rx / DC Orders ED Discharge Orders          Ordered    benzonatate (TESSALON) 100 MG capsule  Every 8 hours        03/23/23 1147    Guaifenesin 1200 MG TB12  2 times daily        03/23/23 1147              Linwood Dibbles, MD 03/23/23 1152

## 2023-03-23 NOTE — ED Triage Notes (Signed)
Pt reports cough that started on Monday. Denies fevers. Pt also reports generalized chest pain that started after the cough.

## 2023-03-23 NOTE — Discharge Instructions (Addendum)
Take the medications as prescribed to help with your cough and congestion.  Follow-up with your doctor to be rechecked if the symptoms do not resolve in the next week.  Return to the ER for worsening symptoms including fever pain , shortness of breath or other concerns

## 2023-03-28 ENCOUNTER — Emergency Department (HOSPITAL_COMMUNITY)
Admission: EM | Admit: 2023-03-28 | Discharge: 2023-03-28 | Disposition: A | Discharge status: Home / Self Care | Payer: Medicare HMO | Attending: Emergency Medicine | Admitting: Emergency Medicine

## 2023-03-28 ENCOUNTER — Other Ambulatory Visit: Payer: Self-pay

## 2023-03-28 ENCOUNTER — Encounter (HOSPITAL_COMMUNITY): Payer: Self-pay

## 2023-03-28 ENCOUNTER — Emergency Department (HOSPITAL_COMMUNITY): Payer: Medicare HMO

## 2023-03-28 DIAGNOSIS — K5730 Diverticulosis of large intestine without perforation or abscess without bleeding: Secondary | ICD-10-CM | POA: Diagnosis not present

## 2023-03-28 DIAGNOSIS — K7689 Other specified diseases of liver: Secondary | ICD-10-CM | POA: Diagnosis not present

## 2023-03-28 DIAGNOSIS — R059 Cough, unspecified: Secondary | ICD-10-CM | POA: Diagnosis not present

## 2023-03-28 DIAGNOSIS — I10 Essential (primary) hypertension: Secondary | ICD-10-CM | POA: Diagnosis not present

## 2023-03-28 DIAGNOSIS — R1031 Right lower quadrant pain: Secondary | ICD-10-CM | POA: Diagnosis not present

## 2023-03-28 DIAGNOSIS — Z79899 Other long term (current) drug therapy: Secondary | ICD-10-CM | POA: Insufficient documentation

## 2023-03-28 DIAGNOSIS — E119 Type 2 diabetes mellitus without complications: Secondary | ICD-10-CM | POA: Diagnosis not present

## 2023-03-28 DIAGNOSIS — I700 Atherosclerosis of aorta: Secondary | ICD-10-CM | POA: Diagnosis not present

## 2023-03-28 LAB — URINALYSIS, ROUTINE W REFLEX MICROSCOPIC
Bilirubin Urine: NEGATIVE
Glucose, UA: NEGATIVE mg/dL
Hgb urine dipstick: NEGATIVE
Ketones, ur: NEGATIVE mg/dL
Leukocytes,Ua: NEGATIVE
Nitrite: NEGATIVE
Protein, ur: NEGATIVE mg/dL
Specific Gravity, Urine: 1.009 (ref 1.005–1.030)
pH: 6 (ref 5.0–8.0)

## 2023-03-28 LAB — COMPREHENSIVE METABOLIC PANEL
ALT: 20 U/L (ref 0–44)
AST: 20 U/L (ref 15–41)
Albumin: 3.9 g/dL (ref 3.5–5.0)
Alkaline Phosphatase: 71 U/L (ref 38–126)
Anion gap: 6 (ref 5–15)
BUN: 18 mg/dL (ref 8–23)
CO2: 24 mmol/L (ref 22–32)
Calcium: 8.9 mg/dL (ref 8.9–10.3)
Chloride: 103 mmol/L (ref 98–111)
Creatinine, Ser: 0.98 mg/dL (ref 0.44–1.00)
GFR, Estimated: >60 mL/min (ref >60)
Glucose, Bld: 132 mg/dL — ABNORMAL HIGH (ref 70–99)
Potassium: 4.4 mmol/L (ref 3.5–5.1)
Sodium: 133 mmol/L — ABNORMAL LOW (ref 135–145)
Total Bilirubin: 0.6 mg/dL (ref 0.3–1.2)
Total Protein: 7.6 g/dL (ref 6.5–8.1)

## 2023-03-28 LAB — CBC WITH DIFFERENTIAL/PLATELET
Abs Immature Granulocytes: 0.01 10*3/uL (ref 0.00–0.07)
Basophils Absolute: 0 10*3/uL (ref 0.0–0.1)
Basophils Relative: 0 %
Eosinophils Absolute: 0.1 10*3/uL (ref 0.0–0.5)
Eosinophils Relative: 2 %
HCT: 43.3 % (ref 36.0–46.0)
Hemoglobin: 13.7 g/dL (ref 12.0–15.0)
Immature Granulocytes: 0 %
Lymphocytes Relative: 40 %
Lymphs Abs: 1.9 10*3/uL (ref 0.7–4.0)
MCH: 25.1 pg — ABNORMAL LOW (ref 26.0–34.0)
MCHC: 31.6 g/dL (ref 30.0–36.0)
MCV: 79.3 fL — ABNORMAL LOW (ref 80.0–100.0)
Monocytes Absolute: 0.4 10*3/uL (ref 0.1–1.0)
Monocytes Relative: 9 %
Neutro Abs: 2.3 10*3/uL (ref 1.7–7.7)
Neutrophils Relative %: 49 %
Platelets: 302 10*3/uL (ref 150–400)
RBC: 5.46 MIL/uL — ABNORMAL HIGH (ref 3.87–5.11)
RDW: 16.8 % — ABNORMAL HIGH (ref 11.5–15.5)
WBC: 4.7 10*3/uL (ref 4.0–10.5)
nRBC: 0 % (ref 0.0–0.2)

## 2023-03-28 LAB — LIPASE, BLOOD: Lipase: 36 U/L (ref 11–51)

## 2023-03-28 MED ORDER — ONDANSETRON HCL 4 MG/2ML IJ SOLN
4.0000 mg | Freq: Once | INTRAMUSCULAR | Status: AC
  Administered 2023-03-28: 4 mg via INTRAVENOUS
  Filled 2023-03-28: qty 2

## 2023-03-28 MED ORDER — IOHEXOL 300 MG/ML  SOLN
100.0000 mL | Freq: Once | INTRAMUSCULAR | Status: AC | PRN
  Administered 2023-03-28: 100 mL via INTRAVENOUS

## 2023-03-28 MED ORDER — METHOCARBAMOL 500 MG PO TABS
500.0000 mg | ORAL_TABLET | Freq: Once | ORAL | Status: AC
  Administered 2023-03-28: 500 mg via ORAL
  Filled 2023-03-28: qty 1

## 2023-03-28 MED ORDER — METHOCARBAMOL 500 MG PO TABS: 500.0000 mg | ORAL_TABLET | Freq: Three times a day (TID) | ORAL | 0 refills | Status: DC

## 2023-03-28 MED ORDER — FENTANYL CITRATE PF 50 MCG/ML IJ SOSY
50.0000 ug | PREFILLED_SYRINGE | Freq: Once | INTRAMUSCULAR | Status: AC
  Administered 2023-03-28: 50 ug via INTRAVENOUS
  Filled 2023-03-28: qty 1

## 2023-03-28 MED ORDER — HYDROMORPHONE HCL 1 MG/ML IJ SOLN
0.5000 mg | Freq: Once | INTRAMUSCULAR | Status: AC
  Administered 2023-03-28: 0.5 mg via INTRAVENOUS
  Filled 2023-03-28: qty 0.5

## 2023-03-28 NOTE — ED Provider Notes (Signed)
[place] EMERGENCY DEPARTMENT AT [facility] Provider Note   CSN: [id number] Arrival date & time: 03/28/23  5621     History {Add pertinent medical, surgical, social history, OB history to HPI:1} Chief Complaint  Patient presents with   Flank Pain    [name] is a 77 y.o. female.   Flank Pain Associated symptoms include abdominal pain. Pertinent negatives include no chest pain, no headaches and no shortness of breath.        [name] is a 77 y.o. female with past medical history of type 2 diabetes, peptic ulcer disease, hiatal hernia, hypertension, who presents to the Emergency Department complaining of sudden onset right lower quadrant pain this morning.  Pain began upon waking.  Pain was initially lateral right lower abdomen flank pain has radiated to right lower quadrant.  Describes pain as sharp and constant.  Associated with nausea, no vomiting or diarrhea.  She denies any dysuria symptoms or history of kidney stone.  She was seen here 4 days ago for cough, states her cough is improving but still present.  Denies any chest pain or shortness of breath fever or chills.  Prior surgical history includes cholecystectomy and complete hysterectomy.   Home Medications Prior to Admission medications   Medication Sig Start Date End Date Taking? Authorizing Provider  acetaminophen (TYLENOL) 500 MG tablet Take 1,000 mg by mouth every 6 (six) hours as needed for mild pain, moderate pain or headache.     [provider]  benzonatate (TESSALON) 100 MG capsule Take 1 capsule (100 mg total) by mouth every 8 (eight) hours. 03/23/23   Linwood Dibbles, MD  Blood Glucose Monitoring Suppl (PRODIGY AUTOCODE BLOOD GLUCOSE) w/Device KIT USE AS DIRECTED 02/16/23   Babs Sciara, MD  clopidogrel (PLAVIX) 75 MG tablet TAKE 1 TABLET EVERY DAY 11/19/22   Babs Sciara, MD  diclofenac Sodium (VOLTAREN) 1 % GEL Apply 2 g topically 4 (four) times daily. 03/20/21    Park Liter, DPM  diclofenac Sodium (VOLTAREN) 1 % GEL Apply 2 g topically 4 (four) times daily. Rub into affected area of foot 2 to 4 times daily 11/25/22   Vivi Barrack, DPM  glucose blood (PRODIGY NO CODING BLOOD GLUC) test strip Use as instructed 12/13/22   Tommie Sams, DO  Guaifenesin 1200 MG TB12 Take 1 tablet (1,200 mg total) by mouth 2 (two) times daily at 10 AM and 5 PM. 03/23/23   Linwood Dibbles, MD  latanoprost (XALATAN) 0.005 % ophthalmic solution 1 drop at bedtime. 09/20/22   [provider]  linaclotide Karlene Einstein) 145 MCG CAPS capsule Take 1 capsule (145 mcg total) by mouth daily. On empty stomach 11/30/22   Tiffany Kocher, PA-C  losartan (COZAAR) 25 MG tablet TAKE 1 TABLET EVERY DAY 12/23/22   Babs Sciara, MD  Multiple Vitamins-Minerals (MULTIVITAMIN WITH MINERALS) tablet Take 1 tablet by mouth daily.    [provider]  pantoprazole (PROTONIX) 40 MG tablet TAKE 1 TABLET EVERY DAY 03/14/23   Babs Sciara, MD  pravastatin (PRAVACHOL) 20 MG tablet TAKE 1 TABLET EVERY EVENING ( DOSE DECREASE ) 02/21/23   Babs Sciara, MD  Prodigy Twist Top Lancets 28G MISC Use as directed 12/16/22   Babs Sciara, MD  sitaGLIPtin (JANUVIA) 50 MG tablet Take 1 tablet (50 mg total) by mouth daily. 12/09/22   Babs Sciara, MD      Allergies    Atorvastatin, Ibuprofen, Norvasc [amlodipine  besylate], Penicillins, Advair diskus [fluticasone-salmeterol], and Metformin and related    Review of Systems   Review of Systems  Constitutional:  Negative for appetite change, chills and fever.  HENT:  Negative for trouble swallowing.   Respiratory:  Positive for cough. Negative for shortness of breath.   Cardiovascular:  Negative for chest pain.  Gastrointestinal:  Positive for abdominal pain and nausea. Negative for constipation, diarrhea and vomiting.  Genitourinary:  Positive for flank pain. Negative for difficulty urinating, dysuria, hematuria and pelvic pain.  Musculoskeletal:   Negative for back pain.  Skin:  Negative for rash.  Neurological:  Negative for dizziness, weakness and headaches.    Physical Exam Updated Vital Signs BP (!) 205/70 (BP Location: Left Arm)   Pulse (!) 59   Temp 97.8 F (36.6 C) (Oral)   Resp 19   Ht 5\' 5"  (1.651 m)   Wt 109.5 kg   SpO2 99%   BMI 40.17 kg/m  Physical Exam Vitals and nursing note reviewed.  Constitutional:      General: She is not in acute distress.    Appearance: Normal appearance. She is not toxic-appearing.     Comments: Patient is uncomfortable appearing  HENT:     Mouth/Throat:     Mouth: Mucous membranes are moist.  Cardiovascular:     Rate and Rhythm: Normal rate and regular rhythm.     Pulses: Normal pulses.  Pulmonary:     Effort: Pulmonary effort is normal.  Abdominal:     General: There is no distension.     Palpations: Abdomen is soft.     Tenderness: There is abdominal tenderness. There is no guarding.     Comments: Pain with palpation right lower quadrant.  Mild rebound tenderness also noted.  Abdomen is soft.  Musculoskeletal:     Right lower leg: No edema.     Left lower leg: No edema.  Skin:    General: Skin is warm.     Capillary Refill: Capillary refill takes less than 2 seconds.  Neurological:     General: No focal deficit present.     Mental Status: She is alert.     Sensory: No sensory deficit.     Motor: No weakness.     ED Results / Procedures / Treatments   Labs (all labs ordered are listed, but only abnormal results are displayed) Labs Reviewed  CBC WITH DIFFERENTIAL/PLATELET  COMPREHENSIVE METABOLIC PANEL  LIPASE, BLOOD  URINALYSIS, ROUTINE W REFLEX MICROSCOPIC    EKG EKG Interpretation Date/Time:  Monday March 28 2023 09:35:44 EDT Ventricular Rate:  54 PR Interval:  163 QRS Duration:  144 QT Interval:  493 QTC Calculation: 417 R Axis:   -50  Text Interpretation: Sinus rhythm Supraventricular bigeminy RBBB and LAFB Confirmed by Meridee Score [phone]) on  03/28/2023 9:47:42 AM  Radiology No results found.  Procedures Procedures  {Document cardiac monitor, telemetry assessment procedure when appropriate:1}  Medications Ordered in ED Medications  ondansetron (ZOFRAN) injection 4 mg (has no administration in time range)  fentaNYL (SUBLIMAZE) injection 50 mcg (has no administration in time range)    ED Course/ Medical Decision Making/ A&P   {   Click here for ABCD2, HEART and other calculatorsREFRESH Note before signing :1}                          Medical Decision Making Patient here with sudden onset right lower quadrant pain.  Pain initially of the right  lateral flank abdomen area has now radiated to the right lower quadrant.  Patient describes pain as sharp and constant with associated nausea.  No vomiting or fever.  Prior surgical history includes cholecystectomy and complete abdominal hysterectomy.   Differential at this time would include but not limited to acute appendicitis, kidney stone, UTI, musculoskeletal pain.  SBO, diverticulitis also considered given patient's history but felt less likely given location of her symptoms. She has continued cough, but endorses improvement.  PNA also considered but felt less likely  Amount and/or Complexity of Data Reviewed Labs: ordered. Radiology: ordered.  Risk Prescription drug management.     {Document critical care time when appropriate:1} {Document review of labs and clinical decision tools ie heart score, Chads2Vasc2 etc:1}  {Document your independent review of radiology images, and any outside records:1} {Document your discussion with family members, caretakers, and with consultants:1} {Document social determinants of health affecting pt's care:1} {Document your decision making why or why not admission, treatments were needed:1} Final Clinical Impression(s) / ED Diagnoses Final diagnoses:  None    Rx / DC Orders ED Discharge Orders     None

## 2023-03-28 NOTE — Discharge Instructions (Signed)
Your blood work, urine test and CT scan today were all reassuring.  I recommend that you alternate ice and heat to your right side.  You may take the muscle relaxer along with Tylenol for pain.  The muscle relaxer can cause drowsiness, do not operate machinery or drive while taking this medication.  Please keep your upcoming appointment with your primary care provider.  Return emergency department for any new or worsening symptoms.

## 2023-03-28 NOTE — ED Triage Notes (Signed)
Pt is complaining of right side pain that has been going on since this morning. Pt states she was here Wednesday and had a diagnoses of URI. Pt denies any urinary symptoms.

## 2023-03-30 ENCOUNTER — Encounter: Payer: Self-pay | Admitting: Internal Medicine

## 2023-03-30 ENCOUNTER — Other Ambulatory Visit: Payer: Self-pay | Admitting: *Deleted

## 2023-03-30 ENCOUNTER — Ambulatory Visit (INDEPENDENT_AMBULATORY_CARE_PROVIDER_SITE_OTHER): Payer: Medicare HMO | Admitting: Internal Medicine

## 2023-03-30 VITALS — BP 135/50 | HR 46 | Temp 98.7°F | Ht 65.0 in | Wt 240.0 lb

## 2023-03-30 DIAGNOSIS — K7689 Other specified diseases of liver: Secondary | ICD-10-CM | POA: Diagnosis not present

## 2023-03-30 DIAGNOSIS — K5792 Diverticulitis of intestine, part unspecified, without perforation or abscess without bleeding: Secondary | ICD-10-CM

## 2023-03-30 DIAGNOSIS — R1011 Right upper quadrant pain: Secondary | ICD-10-CM

## 2023-03-30 DIAGNOSIS — K581 Irritable bowel syndrome with constipation: Secondary | ICD-10-CM

## 2023-03-30 NOTE — Patient Instructions (Signed)
For your constipation, I will give you samples of Linzess 290 mcg.  Let us know next week how you are doing and if improved, I will send in formal prescription.  In regards to right-sided abdominal pain, you do have a very large cyst of your liver.  I will refer you to hepatobiliary surgeon to discuss potential treatment options for this.  We will need to repeat your colonoscopy at some point though we will hold off for now, follow-up in 6 to 8 weeks.  It was very nice seeing you again today Dr. Marletta Lor

## 2023-03-30 NOTE — Progress Notes (Signed)
Referring Provider: Babs Sciara, MD Primary Care Physician:  Babs Sciara, MD Primary GI:  Dr. Marletta Lor  Chief Complaint  Patient presents with   Constipation    Post procedure follow up. Has concerns about constipation. Taking linzess. Usually has BM daily but has not had one in last 2 -3 days.     HPI:   [name] is a 77 y.o. female who presents to clinic today for follow-up visit.  History of IBS constipation predominant. Tried and failed Colace, Miralax, lactulose, senokot, Amitiza, numerous OTC options. Trulance $2000. Currently taking Linzess 145 mcg   CT 09/2022 with uncomplicated sigmoid diverticulitis. She has left kidney lesion and pancreatic lesions that will need follow up MRI in six months, personally discussed with PCP who advised they will monitor.   Follow-up colonoscopy 01/17/2023 showed internal hemorrhoids, significant diverticulosis in the sigmoid, descending, transverse colon's.  1 polyp removed from the ascending colon.  Poor prep with recommended 3-7-month recall.  Presented to [facility], ER 03/28/2023 for abdominal pain.  CT abdomen pelvis with contrast which I personally reviewed without evidence of diverticulitis.  2 liver cysts stable in size.  Today, states her constipation is not well-controlled.  Taking Linzess 145 mcg daily without having bowel movements.  Also complaining of significant abdominal pain, primarily right upper quadrant that radiates around her back.  Mild to moderate, constant.  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 mild CAD by cath in 2005 and 2012 b. low-risk NST's in 07/2016 and 05/2019   Cyst on liver   Depression    Diabetes mellitus    controlled by diet   Diverticulitis    DVT of axillary vein, acute right (HCC) 11/24/2012   Dx on Jan 28, 2012.     Dysrhythmia    Fibrocystic breast disease    Hepatic cyst    Hypertension    IBS (irritable  bowel syndrome)    Impaired glucose tolerance    Menopause    Tachycardia     Past Surgical History:  Procedure Laterality Date   ABDOMINAL HYSTERECTOMY     BACK SURGERY     blood clot Right 2014   arm   BREAST SURGERY  right   cyst removed   CARDIAC CATHETERIZATION  2010   with stent   CAST APPLICATION  02/14/2012   Procedure: CAST APPLICATION;  Surgeon: Vickki Hearing, MD;  Location: [facility];  Service: Orthopedics;  Laterality: Right;  procedure room    CHOLECYSTECTOMY     COLONOSCOPY  12/04/2004   NUR:Few small diverticula at sigmoid colon/Small cecal polyp ablated    COLONOSCOPY N/A 09/24/2014   SLF: small internal hemorrhoids   COLONOSCOPY N/A 02/15/2020   Procedure: COLONOSCOPY;  Surgeon: Corbin Ade, MD;  Location: [facility];  Service: Endoscopy;  Laterality: N/A;  8:30am   COLONOSCOPY WITH PROPOFOL N/A 01/17/2023   Procedure: COLONOSCOPY WITH PROPOFOL;  Surgeon: Lanelle Bal, DO;  Location: [facility];  Service: Endoscopy;  Laterality: N/A;  1:00 pm, ASA 3   ESOPHAGOGASTRODUODENOSCOPY N/A 02/02/2016   Procedure: ESOPHAGOGASTRODUODENOSCOPY (EGD);  Surgeon: West Bali, MD;  Location: [facility];  Service: Endoscopy;  Laterality: N/A;  930    EXTERNAL FIXATION ANKLE FRACTURE     KNEE ARTHROCENTESIS  left   POLYPECTOMY  02/15/2020   Procedure: POLYPECTOMY;  Surgeon: Corbin Ade, MD;  Location: [facility];  Service: Endoscopy;;   POLYPECTOMY  01/17/2023   Procedure: POLYPECTOMY;  Surgeon: Lanelle Bal, DO;  Location: [facility];  Service: Endoscopy;;   TUBAL LIGATION      Current Outpatient Medications  Medication Sig Dispense Refill   acetaminophen (TYLENOL) 500 MG tablet Take 1,000 mg by mouth every 6 (six) hours as needed for mild pain, moderate pain or headache.      benzonatate (TESSALON) 100 MG capsule Take 1 capsule (100 mg total) by mouth every 8 (eight) hours. 21 capsule 0   Blood Glucose Monitoring Suppl (PRODIGY AUTOCODE  BLOOD GLUCOSE) w/Device KIT USE AS DIRECTED 1 kit 3   clopidogrel (PLAVIX) 75 MG tablet TAKE 1 TABLET EVERY DAY 90 tablet 3   diclofenac Sodium (VOLTAREN) 1 % GEL Apply 2 g topically 4 (four) times daily. Rub into affected area of foot 2 to 4 times daily 100 g 2   glucose blood (PRODIGY NO CODING BLOOD GLUC) test strip Use as instructed 100 strip 1   Guaifenesin 1200 MG TB12 Take 1 tablet (1,200 mg total) by mouth 2 (two) times daily at 10 AM and 5 PM. 14 tablet 0   latanoprost (XALATAN) 0.005 % ophthalmic solution 1 drop at bedtime.     linaclotide (LINZESS) 145 MCG CAPS capsule Take 1 capsule (145 mcg total) by mouth daily. On empty stomach (Patient taking differently: Take 145 mcg by mouth every other day. On empty stomach) 90 capsule 3   losartan (COZAAR) 25 MG tablet TAKE 1 TABLET EVERY DAY 90 tablet 3   methocarbamol (ROBAXIN) 500 MG tablet Take 1 tablet (500 mg total) by mouth 3 (three) times daily. May cause drowsiness 21 tablet 0   pantoprazole (PROTONIX) 40 MG tablet TAKE 1 TABLET EVERY DAY 90 tablet 3   pravastatin (PRAVACHOL) 20 MG tablet TAKE 1 TABLET EVERY EVENING ( DOSE DECREASE ) (Patient taking differently: Take 20 mg by mouth every evening.) 90 tablet 1   Prodigy Twist Top Lancets 28G MISC Use as directed 100 each 5   sitaGLIPtin (JANUVIA) 50 MG tablet Take 1 tablet (50 mg total) by mouth daily. 90 tablet 1   No current facility-administered medications for this visit.    Allergies as of 03/30/2023 - Review Complete 03/30/2023  Allergen Reaction Noted   Atorvastatin  06/30/2022   Ibuprofen  05/19/2021   Norvasc [amlodipine besylate] Other (See Comments) 11/21/2012   Penicillins Hives and Other (See Comments)    Advair diskus [fluticasone-salmeterol] Palpitations 11/21/2012   Metformin and related Other (See Comments) 09/28/2013    Family History  Problem Relation Age of Onset   Stroke Mother    Heart attack Mother    Cancer Sister    Diabetes Sister    Seizures  Brother    Diabetes Brother    Heart disease Brother    Kidney disease Son    Colon cancer Neg Hx     Social History   Socioeconomic History   Marital status: Widowed    Spouse name: Not on file   Number of children: 2   Years of education: Not on file   Highest education level: Not on file  Occupational History   Occupation: Retired  Tobacco Use   Smoking status: Never   Smokeless tobacco: Never   Tobacco comments:    Never smoked  Vaping Use   Vaping status: Never Used  Substance and Sexual Activity   Alcohol use: No    Alcohol/week: 0.0  standard drinks of alcohol   Drug use: No   Sexual activity: Yes  Other Topics Concern   Not on file  Social History Narrative   1 son and 1 daughter live close by.   Social Determinants of Health   Financial Resource Strain: Low Risk  (06/02/2021)   Overall Financial Resource Strain (CARDIA)    Difficulty of Paying Living Expenses: Not hard at all  Food Insecurity: No Food Insecurity (06/02/2021)   Hunger Vital Sign    Worried About Running Out of Food in the Last Year: Never true    Ran Out of Food in the Last Year: Never true  Transportation Needs: No Transportation Needs (06/02/2021)   PRAPARE - Administrator, Civil Service (Medical): No    Lack of Transportation (Non-Medical): No  Physical Activity: Insufficiently Active (06/02/2021)   Exercise Vital Sign    Days of Exercise per Week: 2 days    Minutes of Exercise per Session: 30 min  Stress: No Stress Concern Present (06/02/2021)   [employer] of Occupational Health - Occupational Stress Questionnaire    Feeling of Stress : Not at all  Social Connections: Moderately Integrated (06/02/2021)   Social Connection and Isolation Panel [NHANES]    Frequency of Communication with Friends and Family: More than three times a week    Frequency of Social Gatherings with Friends and Family: More than three times a week    Attends Religious Services: More than 4 times  per year    Active Member of [employer] or Organizations: Yes    Attends Banker Meetings: More than 4 times per year    Marital Status: Widowed    Subjective: Review of Systems  Constitutional:  Negative for chills and fever.  HENT:  Negative for congestion and hearing loss.   Eyes:  Negative for blurred vision and double vision.  Respiratory:  Negative for cough and shortness of breath.   Cardiovascular:  Negative for chest pain and palpitations.  Gastrointestinal:  Positive for abdominal pain and constipation. Negative for blood in stool, diarrhea, heartburn, melena and vomiting.  Genitourinary:  Negative for dysuria and urgency.  Musculoskeletal:  Negative for joint pain and myalgias.  Skin:  Negative for itching and rash.  Neurological:  Negative for dizziness and headaches.  Psychiatric/Behavioral:  Negative for depression. The patient is not nervous/anxious.      Objective: BP (!) 135/50 (BP Location: Left Arm, Patient Position: Sitting, Cuff Size: Large)   Pulse (!) 46   Temp 98.7 F (37.1 C) (Oral)   Ht 5\' 5"  (1.651 m)   Wt 240 lb (108.9 kg)   BMI 39.94 kg/m  Physical Exam Constitutional:      Appearance: Normal appearance.  HENT:     Head: Normocephalic and atraumatic.  Eyes:     Extraocular Movements: Extraocular movements intact.     Conjunctiva/sclera: Conjunctivae normal.  Cardiovascular:     Rate and Rhythm: Normal rate and regular rhythm.  Pulmonary:     Effort: Pulmonary effort is normal.     Breath sounds: Normal breath sounds.  Abdominal:     General: Bowel sounds are normal.     Palpations: Abdomen is soft.  Musculoskeletal:        General: No swelling. Normal range of motion.     Cervical back: Normal range of motion and neck supple.  Skin:    General: Skin is warm and dry.     Coloration: Skin is not jaundiced.  Neurological:     General: No focal deficit present.     Mental Status: She is alert and oriented to person, place, and  time.  Psychiatric:        Mood and Affect: Mood normal.        Behavior: Behavior normal.      Assessment/Plan:  1.  Irritable bowel syndrome with constipation-Tried and failed Colace, Miralax, lactulose, senokot, Amitiza, numerous OTC options. Trulance $2000. not well-controlled on Linzess 145 mcg, samples of 290 mcg given today.  Call with update next week and I will send in formal prescription if improved.  2.  Right upper quadrant pain-possibly this is coming from her large liver cyst.  Showed and discussed CT imaging with her today.  Will refer to hepatobiliary to discuss potential treatment options if any.  Appreciate their help immensely.  3.  Diverticulitis-history of sigmoid diverticulitis, poor prep on follow-up colonoscopy.  Will need to reschedule though patient would like to hold off today.  Discuss further on follow-up visit in 6 to 8 weeks.   03/30/2023 10:49 AM   Disclaimer: This note was dictated with voice recognition software. Similar sounding words can inadvertently be transcribed and may not be corrected upon review.

## 2023-03-31 ENCOUNTER — Ambulatory Visit (INDEPENDENT_AMBULATORY_CARE_PROVIDER_SITE_OTHER): Payer: Medicare HMO | Admitting: Family Medicine

## 2023-03-31 VITALS — BP 140/70 | HR 58 | Temp 97.3°F | Ht 65.0 in | Wt 240.0 lb

## 2023-03-31 DIAGNOSIS — E785 Hyperlipidemia, unspecified: Secondary | ICD-10-CM

## 2023-03-31 DIAGNOSIS — K7689 Other specified diseases of liver: Secondary | ICD-10-CM | POA: Diagnosis not present

## 2023-03-31 DIAGNOSIS — I10 Essential (primary) hypertension: Secondary | ICD-10-CM

## 2023-03-31 DIAGNOSIS — E1169 Type 2 diabetes mellitus with other specified complication: Secondary | ICD-10-CM | POA: Diagnosis not present

## 2023-03-31 DIAGNOSIS — E1140 Type 2 diabetes mellitus with diabetic neuropathy, unspecified: Secondary | ICD-10-CM

## 2023-03-31 NOTE — Progress Notes (Signed)
   Subjective:    Patient ID: [name], female    DOB: 1946/08/07, 77 y.o.   MRN: [id number]  HPI 5 month follow up- patient has a list of detailed questions in regards to her liver and its complications- has seen Dr Marletta Lor- possible to refer to hepatologist needed  Patient has been to the ER x 2 in the past few days Patient was seen with diverticulitis Currently on antibiotics Feels somewhat nauseated with the antibiotics Patient also very worried about the liver cyst This is actually dating back well into the mid 2010s but over the course of the past year it has grown to 14 cm.  Gastroenterology is concerned that this is getting closer to the point where rupture could occur and they would like to get a consultation with a specialist Patient is fearful she is going to die from this this   Review of Systems     Objective:   Physical Exam  General-in no acute distress Eyes-no discharge Lungs-respiratory rate normal, CTA CV-no murmurs,RRR Extremities skin warm dry no edema Neuro grossly normal Behavior normal, alert       Assessment & Plan:  1. Essential hypertension Pressure overall good control continue current measures healthy diet  2. Liver cyst Liver cyst is now up to 14 cm she also had significant pain and discomfort with a recent illness in that region gastroenterology is help setting her up with liver specialist with [facility] in [place] Patient very worried about her possibilities of having serious outcomes.  Try to give her as much reassurance as possible.  3. Type 2 diabetes mellitus with diabetic neuropathy, without long-term current use of insulin (HCC) Most recent A1c 7.1 continue current measures healthy diet no low sugars  4. Hyperlipidemia associated with type 2 diabetes mellitus (HCC) Lipid profile acceptable continue current measures  5. Morbid obesity (HCC) Portion control regular physical activity  Follow-up again  within 4 months

## 2023-04-01 ENCOUNTER — Telehealth: Payer: Self-pay | Admitting: Internal Medicine

## 2023-04-04 ENCOUNTER — Telehealth: Payer: Self-pay

## 2023-04-04 NOTE — Telephone Encounter (Signed)
Transition Care Management Follow-up Telephone Call Date of discharge and from where: 03/28/2023 [facility] How have you been since you were released from the hospital? Patient stated she is feeling a little better. Any questions or concerns? No  Items Reviewed: Did the pt receive and understand the discharge instructions provided? Yes  Medications obtained and verified? Yes  Other?  Mailing information for utility assistance for [facility]. Any new allergies since your discharge? No  Dietary orders reviewed? Yes Do you have support at home? Yes   Follow up appointments reviewed:  PCP Hospital f/u appt confirmed? Yes  Scheduled to see Babs Sciara, MD on 03/31/2023 @ [facility] Medicine. Specialist Hospital f/u appt confirmed? Yes  Scheduled to see Lanelle Bal, DO on 03/30/2023 @ [facility] at [facility]. Are transportation arrangements needed?  Mailing information about [employer] transportation to home address. If their condition worsens, is the pt aware to call PCP or go to the Emergency Dept.? Yes Was the patient provided with contact information for the PCP's office or ED? Yes Was to pt encouraged to call back with questions or concerns? Yes   [facility]  [facility] Population Health Community Resource Care Guide   [email or web address]  ?? [phone]   Website: [email or web address]  [email or web address]

## 2023-04-22 DIAGNOSIS — K7689 Other specified diseases of liver: Secondary | ICD-10-CM | POA: Diagnosis not present

## 2023-04-22 DIAGNOSIS — E1140 Type 2 diabetes mellitus with diabetic neuropathy, unspecified: Secondary | ICD-10-CM | POA: Diagnosis not present

## 2023-04-22 DIAGNOSIS — Z7902 Long term (current) use of antithrombotics/antiplatelets: Secondary | ICD-10-CM | POA: Diagnosis not present

## 2023-04-22 DIAGNOSIS — K760 Fatty (change of) liver, not elsewhere classified: Secondary | ICD-10-CM | POA: Diagnosis not present

## 2023-04-25 ENCOUNTER — Encounter (HOSPITAL_COMMUNITY): Payer: Self-pay | Admitting: General Surgery

## 2023-04-25 ENCOUNTER — Encounter: Payer: Self-pay | Admitting: General Practice

## 2023-04-25 ENCOUNTER — Other Ambulatory Visit (HOSPITAL_COMMUNITY): Payer: Self-pay | Admitting: General Surgery

## 2023-04-25 ENCOUNTER — Telehealth: Payer: Self-pay | Admitting: Family Medicine

## 2023-04-25 DIAGNOSIS — K7689 Other specified diseases of liver: Secondary | ICD-10-CM

## 2023-04-25 NOTE — Telephone Encounter (Signed)
Nurses Please touch base with patient She has a cyst aspiration coming up of the liver cyst that she is aware of  In order for the specialist to do this she would have to hold off on the Plavix for 5 days before the procedure and then may resume the Plavix 1 day after the procedure We need her permission to come off of the Plavix Theoretic risk while off of Plavix is very low But we need patient's permission that we can stop her Plavix 5 days before the procedure  Please get the permission and document accordingly.  Obviously if the patient does not get permission please let me know that as well Once I receive the feedback I will notify the technician she can stop Plavix 5 days prior  Caroleen Hamman, NT  Babs Sciara, MD Good Afternoon Patient is scheduled for a cyst aspiration on 9/16 and will need to hold her plavix for 5 days prior to procedure.  Can we have your permission to hold this medication?  Thanks [employer]

## 2023-04-25 NOTE — Progress Notes (Signed)
Roanna Banning, MD  Irish Lack, MD; Almond Lint, MD; Jearld Lesch Cc: Soyla Dryer, Tiffany  @Dr  Byerly, we'll take care of her. Thanks for the referral. @Tiffany  / Seraphim Affinito. Pls schedule at [facility] with Me or Yama. Soonest available, Pls.

## 2023-04-25 NOTE — Progress Notes (Signed)
Kathleen Sciara, MD  [name], NT We are reaching out to the patient to confirm her willingness to stop Plavix-obviously she has to be off Plavix in order to do the procedure but we want to make sure to get her consent as well-once we have this I will send you a message back hopefully within the next 1 to 2 days thank you       Previous Messages    ----- Message ----- From: [name], NT Sent: 04/25/2023   2:22 PM EDT To: Kathleen Sciara, MD Subject: Blood Thinner Hold                            Good Afternoon Patient is scheduled for a cyst aspiration on 9/16 and will need to hold her plavix for 5 days prior to procedure.  Can we have your permission to hold this medication?  Thanks [employer]

## 2023-04-25 NOTE — Progress Notes (Signed)
Berdine Dance, MD  Almond Lint, MD; Georgeann Oppenheim for US aspiration of large liver cyst  She can just be scheduled at [facility] or [facility].  No need for formal clinic visit  Thanks Beryle Beams

## 2023-04-27 NOTE — Telephone Encounter (Signed)
Called pt to ask for permission to stop Plavix for 5 days prior to procedure, pt did not answer, left Vm for callback

## 2023-04-29 ENCOUNTER — Telehealth: Payer: Self-pay

## 2023-04-29 NOTE — Telephone Encounter (Signed)
Dr Marletta Lor,  Documentation from [facility]---- [facility] on your desk for review.

## 2023-05-02 NOTE — Telephone Encounter (Signed)
Did have a conversation with her.  She is a little bit nervous about having this procedure done but understands that this is a safer way to take care of this issue She will give [place] a call 1 to 2 days after the procedure and we will have that message passed on to me and I will give her a call back

## 2023-05-02 NOTE — Telephone Encounter (Signed)
I told the patient it would be fine to stop Plavix 6 days ahead of the procedure and that the specialist would let her know when she can resume the Plavix

## 2023-05-03 ENCOUNTER — Encounter: Payer: Self-pay | Admitting: General Practice

## 2023-05-03 NOTE — Progress Notes (Unsigned)
Kathleen Sciara, MD  Caroleen Hamman, NT I did have a conversation with the patient-she is willing to go ahead and hold the medicine.  I told her 6 days prior to the procedure to stop the Plavix.  I told her the specialist would let her know when she could resume the Plavix after the procedure thank you       Previous Messages    ----- Message ----- From: Caroleen Hamman, NT Sent: 04/25/2023   2:22 PM EDT To: Kathleen Sciara, MD Subject: Blood Thinner Hold                            Good Afternoon Patient is scheduled for a cyst aspiration on 9/16 and will need to hold her plavix for 5 days prior to procedure.  Can we have your permission to hold this medication?  Thanks [employer]

## 2023-05-11 ENCOUNTER — Encounter: Payer: Self-pay | Admitting: Gastroenterology

## 2023-05-11 ENCOUNTER — Ambulatory Visit: Payer: Medicare HMO | Admitting: Gastroenterology

## 2023-05-11 VITALS — BP 154/59 | HR 42 | Temp 97.9°F | Ht 64.0 in | Wt 246.0 lb

## 2023-05-11 DIAGNOSIS — K5900 Constipation, unspecified: Secondary | ICD-10-CM | POA: Diagnosis not present

## 2023-05-11 DIAGNOSIS — K7689 Other specified diseases of liver: Secondary | ICD-10-CM

## 2023-05-11 DIAGNOSIS — K219 Gastro-esophageal reflux disease without esophagitis: Secondary | ICD-10-CM | POA: Diagnosis not present

## 2023-05-11 DIAGNOSIS — R1011 Right upper quadrant pain: Secondary | ICD-10-CM

## 2023-05-11 MED ORDER — LINACLOTIDE 290 MCG PO CAPS: 290.0000 ug | ORAL_CAPSULE | Freq: Every day | ORAL | 3 refills | Status: DC
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
## 2023-05-11 NOTE — Patient Instructions (Signed)
Continue Tylenol for pain management (take as per package directions). Continue pantoprazole 40 mg daily before breakfast. New prescription provided for Linzess 290 mcg daily. Please reach out to [place] after you complete liver cyst drainage to let us know if abdominal pain improves. We will plan for updated colonoscopy after workup/management of your abdominal pain has been addressed.

## 2023-05-13 ENCOUNTER — Other Ambulatory Visit (HOSPITAL_COMMUNITY): Payer: Self-pay | Admitting: Student

## 2023-05-13 DIAGNOSIS — K7689 Other specified diseases of liver: Secondary | ICD-10-CM

## 2023-05-16 ENCOUNTER — Ambulatory Visit (HOSPITAL_COMMUNITY)
Admission: RE | Admit: 2023-05-16 | Discharge: 2023-05-16 | Disposition: A | Discharge status: Home / Self Care | Payer: Medicare HMO | Source: Ambulatory Visit | Attending: Interventional Radiology

## 2023-05-16 ENCOUNTER — Other Ambulatory Visit (HOSPITAL_COMMUNITY): Payer: Self-pay | Admitting: General Surgery

## 2023-05-16 ENCOUNTER — Ambulatory Visit (HOSPITAL_COMMUNITY)
Admission: RE | Admit: 2023-05-16 | Discharge: 2023-05-16 | Disposition: A | Discharge status: Home / Self Care | Payer: Medicare HMO | Source: Ambulatory Visit | Attending: General Surgery | Admitting: General Surgery

## 2023-05-16 ENCOUNTER — Ambulatory Visit (HOSPITAL_COMMUNITY): Payer: Medicare HMO

## 2023-05-16 DIAGNOSIS — Z9889 Other specified postprocedural states: Secondary | ICD-10-CM | POA: Diagnosis not present

## 2023-05-16 DIAGNOSIS — D181 Lymphangioma, any site: Secondary | ICD-10-CM | POA: Diagnosis not present

## 2023-05-16 DIAGNOSIS — K7689 Other specified diseases of liver: Secondary | ICD-10-CM

## 2023-05-16 LAB — CBC
HCT: 43.2 % (ref 36.0–46.0)
Hemoglobin: 13.2 g/dL (ref 12.0–15.0)
MCH: 24.3 pg — ABNORMAL LOW (ref 26.0–34.0)
MCHC: 30.6 g/dL (ref 30.0–36.0)
MCV: 79.6 fL — ABNORMAL LOW (ref 80.0–100.0)
Platelets: 301 10*3/uL (ref 150–400)
RBC: 5.43 MIL/uL — ABNORMAL HIGH (ref 3.87–5.11)
RDW: 16.7 % — ABNORMAL HIGH (ref 11.5–15.5)
WBC: 6.6 10*3/uL (ref 4.0–10.5)
nRBC: 0 % (ref 0.0–0.2)

## 2023-05-16 LAB — PROTIME-INR
INR: 1 (ref 0.8–1.2)
Prothrombin Time: 13.4 s (ref 11.4–15.2)

## 2023-05-16 LAB — GLUCOSE, CAPILLARY: Glucose-Capillary: 131 mg/dL — ABNORMAL HIGH (ref 70–99)

## 2023-05-16 MED ORDER — MIDAZOLAM HCL 2 MG/2ML IJ SOLN
INTRAMUSCULAR | Status: AC
  Filled 2023-05-16: qty 4

## 2023-05-16 MED ORDER — SODIUM CHLORIDE 0.9 % IV SOLN: INTRAVENOUS | Status: DC

## 2023-05-16 MED ORDER — HYDROCODONE-ACETAMINOPHEN 5-325 MG PO TABS
1.0000 | ORAL_TABLET | Freq: Once | ORAL | Status: AC
  Administered 2023-05-16: 1 via ORAL
  Filled 2023-05-16: qty 1

## 2023-05-16 MED ORDER — FENTANYL CITRATE (PF) 100 MCG/2ML IJ SOLN
INTRAMUSCULAR | Status: AC
  Filled 2023-05-16: qty 4

## 2023-05-16 MED ORDER — FENTANYL CITRATE (PF) 100 MCG/2ML IJ SOLN
INTRAMUSCULAR | Status: AC | PRN
  Administered 2023-05-16 (×3): 25 ug via INTRAVENOUS
  Administered 2023-05-16 (×2): 50 ug via INTRAVENOUS
  Administered 2023-05-16: 25 ug via INTRAVENOUS

## 2023-05-16 MED ORDER — HYDROMORPHONE HCL 1 MG/ML IJ SOLN
INTRAMUSCULAR | Status: AC | PRN
  Administered 2023-05-16: .5 mg via INTRAVENOUS

## 2023-05-16 MED ORDER — STERILE WATER FOR INJECTION IJ SOLN
200.0000 mg | INTRAVENOUS | Status: AC
  Administered 2023-05-16: 200 mg
  Filled 2023-05-16: qty 200

## 2023-05-16 MED ORDER — HYDROMORPHONE HCL 1 MG/ML IJ SOLN
INTRAMUSCULAR | Status: AC
  Filled 2023-05-16: qty 1

## 2023-05-16 MED ORDER — ONDANSETRON HCL 4 MG/2ML IJ SOLN
INTRAMUSCULAR | Status: AC
  Filled 2023-05-16: qty 2

## 2023-05-16 MED ORDER — MIDAZOLAM HCL 2 MG/2ML IJ SOLN
INTRAMUSCULAR | Status: AC | PRN
  Administered 2023-05-16: .5 mg via INTRAVENOUS
  Administered 2023-05-16 (×2): 1 mg via INTRAVENOUS
  Administered 2023-05-16 (×3): .5 mg via INTRAVENOUS

## 2023-05-16 MED ORDER — ONDANSETRON HCL 4 MG/2ML IJ SOLN
INTRAMUSCULAR | Status: AC | PRN
  Administered 2023-05-16: 4 mg via INTRAVENOUS

## 2023-05-16 MED ORDER — HYDRALAZINE HCL 20 MG/ML IJ SOLN
INTRAMUSCULAR | Status: AC
  Filled 2023-05-16: qty 1

## 2023-05-16 NOTE — Sedation Documentation (Signed)
Chest x-ray completed in ultrasound

## 2023-05-16 NOTE — Sedation Documentation (Signed)
Dr. Milford Cage aspirated Doxycycline due to patient not being able to tolerate pain

## 2023-05-16 NOTE — H&P (Signed)
Chief Complaint: Patient was seen in consultation today for hepatic cyst aspiration   Referring Physician(s): GNFAOZ,HYQMV  Supervising Physician: Roanna Banning  Patient Status: [facility] - Out-pt  History of Present Illness: [name] is a 77 y.o. 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 She is referred and scheduled for aspiration, possible sclerotherapy.  PMHx, meds, labs, imaging, allergies reviewed. Has held Plavix X 5 days as instructed Feels well, no recent fevers, chills, illness. Has been NPO today as directed.   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 mild CAD by cath in 2005 and 2012 b. low-risk NST's in 07/2016 and 05/2019   Cyst on liver   Depression    Diabetes mellitus    controlled by diet   Diverticulitis    DVT of axillary vein, acute right (HCC) 11/24/2012   Dx on Jan 28, 2012.     Dysrhythmia    Fibrocystic breast disease    Hepatic cyst    Hypertension    IBS (irritable bowel syndrome)    Impaired glucose tolerance    Menopause    Tachycardia     Past Surgical History:  Procedure Laterality Date   ABDOMINAL HYSTERECTOMY     BACK SURGERY     blood clot Right 2014   arm   BREAST SURGERY  right   cyst removed   CARDIAC CATHETERIZATION  2010   with stent   CAST APPLICATION  02/14/2012   Procedure: CAST APPLICATION;  Surgeon: Vickki Hearing, MD;  Location: [facility];  Service: Orthopedics;  Laterality: Right;  procedure room    CHOLECYSTECTOMY     COLONOSCOPY  12/04/2004   NUR:Few small diverticula at sigmoid colon/Small cecal polyp ablated    COLONOSCOPY N/A 09/24/2014   SLF: small internal hemorrhoids   COLONOSCOPY N/A 02/15/2020   Procedure: COLONOSCOPY;  Surgeon: Kathleen Ade, MD;  Location: [facility];  Service: Endoscopy;  Laterality: N/A;  8:30am   COLONOSCOPY WITH PROPOFOL N/A 01/17/2023   Procedure: COLONOSCOPY WITH  PROPOFOL;  Surgeon: Lanelle Bal, DO;  Location: [facility];  Service: Endoscopy;  Laterality: N/A;  1:00 pm, ASA 3   ESOPHAGOGASTRODUODENOSCOPY N/A 02/02/2016   Procedure: ESOPHAGOGASTRODUODENOSCOPY (EGD);  Surgeon: West Bali, MD;  Location: [facility];  Service: Endoscopy;  Laterality: N/A;  930    EXTERNAL FIXATION ANKLE FRACTURE     KNEE ARTHROCENTESIS  left   POLYPECTOMY  02/15/2020   Procedure: POLYPECTOMY;  Surgeon: Kathleen Ade, MD;  Location: [facility];  Service: Endoscopy;;   POLYPECTOMY  01/17/2023   Procedure: POLYPECTOMY;  Surgeon: Lanelle Bal, DO;  Location: [facility];  Service: Endoscopy;;   TUBAL LIGATION      Allergies: Atorvastatin, Ibuprofen, Norvasc [amlodipine besylate], Penicillins, Advair diskus [fluticasone-salmeterol], and Metformin and related  Medications: Prior to Admission medications   Medication Sig Start Date End Date Taking? Authorizing Provider  acetaminophen (TYLENOL) 500 MG tablet Take 1,000 mg by mouth every 6 (six) hours as needed for mild pain, moderate pain or headache.    Yes [provider]  Cholecalciferol (VITAMIN D-3) 125 MCG (5000 UT) TABS Take 5,000 Units by mouth daily.   Yes [provider]  latanoprost (XALATAN) 0.005 % ophthalmic solution 1 drop at bedtime. 09/20/22  Yes [provider]  linaclotide (LINZESS) 290 MCG CAPS capsule Take 1  capsule (290 mcg total) by mouth daily before breakfast. 05/11/23  Yes Tiffany Kocher, PA-C  losartan (COZAAR) 25 MG tablet TAKE 1 TABLET EVERY DAY 12/23/22  Yes Babs Sciara, MD  pantoprazole (PROTONIX) 40 MG tablet TAKE 1 TABLET EVERY DAY 03/14/23  Yes Luking, Jonna Coup, MD  pravastatin (PRAVACHOL) 20 MG tablet TAKE 1 TABLET EVERY EVENING ( DOSE DECREASE ) Patient taking differently: Take 20 mg by mouth every evening. 02/21/23  Yes Luking, Scott A, MD  sitaGLIPtin (JANUVIA) 50 MG tablet Take 1 tablet (50 mg total) by mouth daily. 12/09/22  Yes Babs Sciara, MD  clopidogrel (PLAVIX) 75 MG tablet TAKE 1 TABLET EVERY DAY 11/19/22   Babs Sciara, MD  diclofenac Sodium (VOLTAREN) 1 % GEL Apply 2 g topically 4 (four) times daily. Rub into affected area of foot 2 to 4 times daily 11/25/22   Vivi Barrack, DPM     Family History  Problem Relation Age of Onset   Stroke Mother    Heart attack Mother    Cancer Sister    Diabetes Sister    Seizures Brother    Diabetes Brother    Heart disease Brother    Kidney disease Son    Colon cancer Neg Hx     Social History   Socioeconomic History   Marital status: Widowed    Spouse name: Not on file   Number of children: 2   Years of education: Not on file   Highest education level: Not on file  Occupational History   Occupation: Retired  Tobacco Use   Smoking status: Never   Smokeless tobacco: Never   Tobacco comments:    Never smoked  Vaping Use   Vaping status: Never Used  Substance and Sexual Activity   Alcohol use: No    Alcohol/week: 0.0 standard drinks of alcohol   Drug use: No   Sexual activity: Yes  Other Topics Concern   Not on file  Social History Narrative   1 son and 1 daughter live close by.   Social Determinants of Health   Financial Resource Strain: Low Risk  (06/02/2021)   Overall Financial Resource Strain (CARDIA)    Difficulty of Paying Living Expenses: Not hard at all  Food Insecurity: No Food Insecurity (06/02/2021)   Hunger Vital Sign    Worried About Running Out of Food in the Last Year: Never true    Ran Out of Food in the Last Year: Never true  Transportation Needs: No Transportation Needs (04/04/2023)   PRAPARE - Administrator, Civil Service (Medical): No    Lack of Transportation (Non-Medical): No  Physical Activity: Insufficiently Active (06/02/2021)   Exercise Vital Sign    Days of Exercise per Week: 2 days    Minutes of Exercise per Session: 30 min  Stress: No Stress Concern Present (06/02/2021)   [employer] of  Occupational Health - Occupational Stress Questionnaire    Feeling of Stress : Not at all  Social Connections: Moderately Integrated (06/02/2021)   Social Connection and Isolation Panel [NHANES]    Frequency of Communication with Friends and Family: More than three times a week    Frequency of Social Gatherings with Friends and Family: More than three times a week    Attends Religious Services: More than 4 times per year    Active Member of [employer] or Organizations: Yes    Attends Banker Meetings: More than 4 times per year  Marital Status: Widowed    Review of Systems: A 12 point ROS discussed and pertinent positives are indicated in the HPI above.  All other systems are negative.  Review of Systems  Vital Signs: BP (!) 153/70   Pulse (!) 43   Temp 98.2 F (36.8 C) (Oral)   Resp 18   Ht 5\' 4"  (1.626 m)   Wt 230 lb (104.3 kg)   SpO2 99%   BMI 39.48 kg/m   Physical Exam Constitutional:      Appearance: She is not ill-appearing.  HENT:     Mouth/Throat:     Mouth: Mucous membranes are moist.     Pharynx: Oropharynx is clear.  Cardiovascular:     Rate and Rhythm: Normal rate and regular rhythm.     Heart sounds: Normal heart sounds.  Pulmonary:     Effort: Pulmonary effort is normal. No respiratory distress.     Breath sounds: Normal breath sounds.  Skin:    General: Skin is warm and dry.  Neurological:     General: No focal deficit present.     Mental Status: She is alert and oriented to person, place, and time.  Psychiatric:        Mood and Affect: Mood normal.        Thought Content: Thought content normal.     Imaging: No results found.  Labs:  CBC: Recent Labs    10/12/22 1108 03/23/23 1022 03/28/23 0951 05/16/23 0654  WBC 6.0 5.6 4.7 6.6  HGB 12.8 13.3 13.7 13.2  HCT 40.6 41.5 43.3 43.2  PLT 364 304 302 301    COAGS: Recent Labs    05/16/23 0654  INR 1.0    BMP: Recent Labs    10/12/22 1108 01/12/23 1105 03/11/23 0904  03/23/23 1022 03/28/23 0951  NA 138 139 141 137 133*  K 4.1 4.2 4.7 4.0 4.4  CL 103 105 104 104 103  CO2 25 28 25 27 24   GLUCOSE 116* 125* 116* 123* 132*  BUN 16 19 15 12 18   CALCIUM 9.1 9.0  --  9.4 8.9  CREATININE 0.88 0.94 0.98 0.91 0.98  GFRNONAA >60 >60  --  >60 >60    LIVER FUNCTION TESTS: Recent Labs    10/12/22 1108 03/28/23 0951  BILITOT 0.4 0.6  AST 20 20  ALT 19 20  ALKPHOS 70 71  PROT 7.4 7.6  ALBUMIN 3.9 3.9     Assessment and Plan: Hepatic cyst Plan for aspiration and possible sclerotherapy. Labs reviewed. Risks and benefits of hepatic cyst aspiration was discussed with the patient and/or patient's family including, but not limited to bleeding, infection, damage to adjacent structures or low yield requiring additional tests.  All of the questions were answered and there is agreement to proceed.  Consent signed and in chart.    Electronically Signed: Brayton El, PA-C 05/16/2023, 7:39 AM   I spent a total of 20 minutes in face to face in clinical consultation, greater than 50% of which was counseling/coordinating care for hepatic cyst aspiration

## 2023-05-17 ENCOUNTER — Telehealth: Payer: Self-pay | Admitting: Family Medicine

## 2023-05-17 LAB — CYTOLOGY - NON PAP

## 2023-05-17 NOTE — Telephone Encounter (Signed)
Patient was told call after having procedure done today. That you would call her later on today

## 2023-05-17 NOTE — Telephone Encounter (Signed)
Front-I spoke with patient-please set her up for a follow-up from her liver cyst drainage next week with me preferably Monday or Tuesday thank you  I did talk with the patient she denies any severe pain just having soreness where they did the procedure no bleeding from it. She asked when she could restart her medications she can restart her Plavix on Thursday otherwise her other medicines can restart tomorrow She said that the hospital told her heart rate was slow but previous 2 EKGs her heart rate was in the 50s so I do not think this is a new finding I do not feel it is due to her losartan

## 2023-05-18 ENCOUNTER — Other Ambulatory Visit: Payer: Self-pay | Admitting: Family Medicine

## 2023-05-23 ENCOUNTER — Ambulatory Visit: Payer: Medicare HMO | Admitting: Family Medicine

## 2023-05-23 VITALS — BP 138/60 | HR 66 | Temp 98.2°F | Ht 64.0 in | Wt 242.0 lb

## 2023-05-23 DIAGNOSIS — Z23 Encounter for immunization: Secondary | ICD-10-CM | POA: Diagnosis not present

## 2023-05-23 DIAGNOSIS — K7689 Other specified diseases of liver: Secondary | ICD-10-CM | POA: Diagnosis not present

## 2023-05-23 NOTE — Progress Notes (Signed)
Subjective:    Patient ID: [name], female    DOB: 1946/03/05, 77 y.o.   MRN: [id number]  HPI  Post liver procedure check up monday a week ago  Fluid was drained . Experiencing tenderness in R ABD area, no fever or chills, no drainage noted from site. Pt stated could not tolerate post proc drainage bag , and reported low heart rate 41  Her heart rate is good today blood pressure good today lungs clear Review of Systems     Objective:   Physical Exam  Abdomen is soft there is no guarding or rebound there is some mild tenderness right upper quadrant positive bowel sound lungs clear heart regular no edema of any significant level in the legs      Assessment & Plan:   Liver cyst-tolerated procedure well Apparently they were not able to do the sclerosing is much as they had hoped for Recommend a follow-up ultrasound more than likely 3 months but will touch base with gastroenterology  Patient already has a follow-up appointment in December  Message was sent to gastroenterology

## 2023-05-27 ENCOUNTER — Telehealth: Payer: Self-pay | Admitting: Family Medicine

## 2023-05-27 DIAGNOSIS — K7689 Other specified diseases of liver: Secondary | ICD-10-CM

## 2023-05-27 NOTE — Telephone Encounter (Signed)
Nurses Please reach out to the patient She previously had a liver cyst which was drained by interventional radiology.  Please let patient know that I did communicate with Tana Coast, PA with gastroenterology.  She recommends repeat ultrasound in 3 months time of the liver cyst Please set up ultrasound of the liver due to liver cyst thank you

## 2023-05-30 ENCOUNTER — Ambulatory Visit (INDEPENDENT_AMBULATORY_CARE_PROVIDER_SITE_OTHER): Payer: Medicare HMO

## 2023-05-30 ENCOUNTER — Ambulatory Visit: Payer: Medicare HMO | Admitting: Podiatry

## 2023-05-30 DIAGNOSIS — Z7901 Long term (current) use of anticoagulants: Secondary | ICD-10-CM

## 2023-05-30 DIAGNOSIS — M778 Other enthesopathies, not elsewhere classified: Secondary | ICD-10-CM | POA: Diagnosis not present

## 2023-05-30 DIAGNOSIS — B351 Tinea unguium: Secondary | ICD-10-CM

## 2023-05-30 DIAGNOSIS — M79675 Pain in left toe(s): Secondary | ICD-10-CM

## 2023-05-30 DIAGNOSIS — M79674 Pain in right toe(s): Secondary | ICD-10-CM

## 2023-05-30 NOTE — Addendum Note (Signed)
Addended by: Elizbeth Squires on: 05/30/2023 04:14 PM   Modules accepted: Orders

## 2023-05-30 NOTE — Telephone Encounter (Signed)
Called and left message for patient to call office. Nurse Note*She previously had a liver cyst which was drained by interventional radiology.  Please let patient know that I did communicate with Tana Coast, PA with gastroenterology.  She recommends repeat ultrasound in 3 months time of the liver cyst Please set up ultrasound of the liver due to liver cyst thank you put in reminder file.

## 2023-05-30 NOTE — Progress Notes (Unsigned)
Subjective: Chief Complaint  Patient presents with   Nail Problem    Nail/ callus care    77 year old female presents as a above concerns.  She states that she struggles with staying on top of the right foot 1 week ago.  No injuries that she reports or any changes in activities or shoes when the symptoms started.  Does not radiate.  She is due for lipids been doing well and no issues.  Just needs the nails trimmed there is a thick elongated she cannot do them herself and are causing discomfort.  No open lesions.  No other concerns.  Objective: AAO x3, NAD DP/PT pulses palpable bilaterally, CRT less than 3 seconds There is tenderness palpation on the dorsal aspect of the right midfoot mostly at first, second metatarsal cuneiform joint on the midfoot.  There is no edema, erythema.  Flexor, extensor tendons were to be intact.  MMT 5/5.  There is no open lesions. Nails are hypertrophic, dystrophic, brittle, discolored, elongated 10. No surrounding redness or drainage. Tenderness nails 1-5 bilaterally. No open lesions or pre-ulcerative lesions are identified today. No pain with calf compression, swelling, warmth, erythema  Assessment: 77 year old female capsulitis right foot, symptomatic onychomycosis  Plan: -All treatment options discussed with the patient including all alternatives, risks, complications.  -X-rays were obtained reviewed of the right foot.  3 views of the foot were obtained.  There is no evidence of acute fracture noted today.  Bunion is evident.  Mild narrowing along the second metatarsocuneiform joint noted on the oblique view. -Of the right foot we discussed possible steroid injection but we held off on this today.  Discussed Voltaren gel to apply topically.  Discussed re-lacing her shoes to avoid any pressure. -Sharply debrided the nails x 10 without any complications or bleeding. -Patient encouraged to call the office with any questions, concerns, change in symptoms.    Vivi Barrack DPM

## 2023-05-31 ENCOUNTER — Telehealth: Payer: Self-pay

## 2023-05-31 NOTE — Telephone Encounter (Signed)
Patient called and patient advised per Dr Lorin Picket She previously had a liver cyst which was drained by interventional radiology.  Please let patient know that I did communicate with Tana Coast, PA with gastroenterology.  She recommends repeat ultrasound in 3 months time of the liver cyst Please set up ultrasound of the liver due to liver cyst thank you put in reminder file. Patient verbalized understanding.

## 2023-06-28 ENCOUNTER — Encounter: Payer: Self-pay | Admitting: Student

## 2023-06-28 ENCOUNTER — Other Ambulatory Visit: Payer: Self-pay | Admitting: Student

## 2023-06-28 ENCOUNTER — Ambulatory Visit: Payer: Medicare HMO | Attending: Student | Admitting: Student

## 2023-06-28 ENCOUNTER — Ambulatory Visit: Payer: Medicare HMO

## 2023-06-28 VITALS — BP 142/78 | HR 52 | Ht 65.0 in | Wt 248.0 lb

## 2023-06-28 DIAGNOSIS — R001 Bradycardia, unspecified: Secondary | ICD-10-CM

## 2023-06-28 DIAGNOSIS — E785 Hyperlipidemia, unspecified: Secondary | ICD-10-CM | POA: Diagnosis not present

## 2023-06-28 DIAGNOSIS — I10 Essential (primary) hypertension: Secondary | ICD-10-CM

## 2023-06-28 DIAGNOSIS — Z87898 Personal history of other specified conditions: Secondary | ICD-10-CM | POA: Diagnosis not present

## 2023-06-28 DIAGNOSIS — R140 Abdominal distension (gaseous): Secondary | ICD-10-CM

## 2023-06-28 MED ORDER — FUROSEMIDE 20 MG PO TABS: 20.0000 mg | ORAL_TABLET | Freq: Every day | ORAL | 0 refills | Status: DC | PRN

## 2023-06-28 NOTE — Progress Notes (Signed)
Cardiology Office Note    Date:  06/28/2023  ID:  [name], DOB Jul 01, 1946, MRN [id number] Cardiologist: Dina Rich, MD    History of Present Illness:    [name] is a 77 y.o. female with past medical history of chest pain (mild CAD by caths in 2005 and 2012 with low-risk NST's in 07/2016 and 05/2019), baseline bradycardia, HTN, HLD and history of DVT who presents to the office today for 16-month follow-up.  She was examined by myself in 12/2022 and reported left ankle/foot pain which was being followed by Podiatry and she was participating in PT. Reported being less active given this and had experienced dyspnea on exertion but denied any exertional chest pain or palpitations. The option of a follow-up echocardiogram was reviewed but she wished to hold off on that at the time given stability of her symptoms. She was continued on her current medical therapy with Plavix 75 mg daily (previously intolerant to ASA), Pravastatin 20 mg daily and Losartan 25 mg daily. Was not on a beta-blocker due to baseline bradycardia.  In talking with the patient today, she reports undergoing biopsy of a liver cyst last month and has experienced some abdominal swelling since. However, she also reports consuming more fast food and takeout as her family members have been bringing her food during this timeframe. She denies any associated lower extremity edema. Breathing has been stable with no specific dyspnea on exertion, orthopnea or PND. No recent chest pain or palpitations. She denies any dizziness or presyncope.  Studies Reviewed:   EKG: EKG is ordered today and demonstrates:   EKG Interpretation Date/Time:  Tuesday June 28 2023 13:02:44 EDT Ventricular Rate:  51 PR Interval:  192 QRS Duration:  130 QT Interval:  442 QTC Calculation: 407 R Axis:   -36  Text Interpretation: Sinus bradycardia with Premature supraventricular complexes with junctional escape beats. 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 RBBB seen throughout study. The study is normal. No myocardial ischemia or scar. This is a low risk study. Nuclear stress EF: 73%.    Event Monitor: 03/2021 7 day monitor Occasional supraventricular ectopy in the form of isolated PACs, rare couplets and triplets. Rare SVT episodes up to 18 beats Rare ventricular ectopy in the form of PVCs and couplets. Isolated accelerated idioventricular rhythm 12 beats 14 sinus pauses occurred, longest lasting 4.3 seconds. Pauses were nocturnal. No symptoms reported     Patch Wear Time:  6 days and 20 hours (2022-07-26T10:59:28-0400 to 2022-08-02T07:34:34-0400)   Patient had a min HR of 31 bpm, max HR of 169 bpm, and avg HR of 55 bpm. Predominant underlying rhythm was Sinus Rhythm. Bundle Branch Block/IVCD was present. 1 run of Ventricular Tachycardia occurred lasting 12 beats with a max rate of 126 bpm (avg 103  bpm). 8 Supraventricular Tachycardia runs occurred, the run with the fastest interval lasting 8 beats with a max rate of 169 bpm, the longest lasting 18 beats with an avg rate of 94 bpm. 14 Pauses occurred, the longest lasting 4.3 secs (14 bpm).  Junctional Rhythm was present. Isolated SVEs were occasional (2.9%, 15250), SVE Couplets were rare (<1.0%, 1071), and SVE Triplets were rare (<1.0%, 51). Isolated VEs were rare (<1.0%), VE Couplets were rare (<1.0%), and no VE Triplets were present.  Risk Assessment/Calculations:     HYPERTENSION  CONTROL Vitals:   06/28/23 1246 06/28/23 1256 06/28/23 1325  BP: (!) 158/62 (!) 162/60 (!) 142/78    The patient's blood pressure is elevated above target today.  In order to address the patient's elevated BP: Blood pressure will be monitored at home to determine if medication changes need to be made.; A new  medication was prescribed today. (Diet changes given sodium intake.)     Physical Exam:   VS:  BP (!) 142/78   Pulse (!) 52   Ht 5\' 5"  (1.651 m)   Wt 248 lb (112.5 kg)   SpO2 98%   BMI 41.27 kg/m    Wt Readings from Last 3 Encounters:  06/28/23 248 lb (112.5 kg)  05/23/23 242 lb (109.8 kg)  05/16/23 230 lb (104.3 kg)     GEN: Well nourished, well developed female appearing in no acute distress NECK: No JVD; No carotid bruits CARDIAC: RRR, no murmurs, rubs, gallops RESPIRATORY:  Clear to auscultation without rales, wheezing or rhonchi  ABDOMEN: Appears non-distended. No obvious abdominal masses. Site of biopsy without erythema or drainage.  EXTREMITIES: No clubbing or cyanosis. No pitting edema.  Distal pedal pulses are 2+ bilaterally.   Assessment and Plan:   1. History of Chest Pain - Prior catheterization showed mild nonobstructive disease and most recent ischemic evaluation was a low-risk NST in 05/2019. She denies any recent chest pain. Continue with risk factor modification.  Continue statin therapy and she is on Plavix given prior intolerances to ASA.  2. HTN/Abdominal Distension - Blood pressure is elevated at 142/78 during today's visit. She has been consuming a very high sodium diet given family members have been brining take-out to her. Will provide with Rx for Lasix to take as needed for weight gain and edema. Will provide 20 tablets and I told her to make [place] aware if she is having take this more than 2-3 times per week as we would need to obtain follow-up labs (BMET). Creatinine was stable at 0.98 in 02/2023. I also encouraged her to follow-up with her surgeon if abdominal distention does not improve given her recent surgery.   - Will continue Losartan 25 mg daily for BP control.  If this remains elevated over time, could further titrate to 50 mg daily.  3. HLD - LDL was at 73 when checked in 02/2023. Continue current medical therapy with Pravastatin 20 mg  daily.  4. 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 EKG today does show these but also noted to have junctional escape beats. Was previously evaluated Dr. Ladona Ridgel in 03/2021 with watchful waiting recommended given her her prolonged pauses typically occurred at night. Reviewed with Dr. Diona Browner (DOD) and will arrange for a 7-day Zio patch to assess for any recurrence and refer back to EP based off results.  Continue to avoid AV nodal blocking agents.  Signed, Ellsworth Lennox, PA-C

## 2023-06-28 NOTE — Patient Instructions (Addendum)
Medication Instructions:  Your physician has recommended you make the following change in your medication:  Start furosemide 20 mg daily as needed for swelling Continue all other medications as prescribed  Labwork: none  Testing/Procedures: 7 Day ZIO XT  Follow-Up: Your physician recommends that you schedule a follow-up appointment in: 2-3 months  Any Other Special Instructions Will Be Listed Below (If Applicable).  If you need a refill on your cardiac medications before your next appointment, please call your pharmacy.

## 2023-07-04 ENCOUNTER — Ambulatory Visit (INDEPENDENT_AMBULATORY_CARE_PROVIDER_SITE_OTHER): Payer: Medicare HMO | Admitting: Family Medicine

## 2023-07-04 VITALS — BP 138/86 | HR 48 | Temp 97.7°F | Ht 65.0 in | Wt 248.0 lb

## 2023-07-04 DIAGNOSIS — R0789 Other chest pain: Secondary | ICD-10-CM

## 2023-07-04 DIAGNOSIS — N644 Mastodynia: Secondary | ICD-10-CM | POA: Diagnosis not present

## 2023-07-04 DIAGNOSIS — K7689 Other specified diseases of liver: Secondary | ICD-10-CM | POA: Diagnosis not present

## 2023-07-04 DIAGNOSIS — I10 Essential (primary) hypertension: Secondary | ICD-10-CM

## 2023-07-04 NOTE — Progress Notes (Signed)
   Subjective:    Patient ID: [name], female    DOB: 1945-12-26, 77 y.o.   MRN: [id number]  Discussed the use of AI scribe software for clinical note transcription with the patient, who gave verbal consent to proceed.  History of Present Illness   The patient presents with a new onset of right-sided chest pain, described as a dull, nagging discomfort that starts under the breast and radiates to the back. The pain is intermittent, occurring every other day, and is sometimes severe. The patient has been managing the pain with Tylenol, which provides some relief. The pain is particularly noticeable at night when lying down. The patient also reports a new onset of breast pain under the right arm, which started the morning of the consultation.  The patient has a history of liver cysts, which were drained approximately two months prior to the consultation. The patient reports a feeling of pressure in the area of the liver, distinct from the sharp chest pain. The patient is also currently wearing a cardiac monitor due to episodes of tachycardia.  The patient's activities include vacuuming and other household chores, which they speculate may be contributing to the chest pain.         Review of Systems     Objective:    Physical Exam   VITALS: BP- 148/82 CARDIOVASCULAR: Heart rhythm regular. BREAST: Tenderness in the right breast and underarm. No signs of infection, no palpable masses. ABDOMEN: Abdominal tenderness upon palpation. Tenderness in the right upper quadrant. SKIN: No abnormalities noted.     Breast exam no mass felt.  Subjective tenderness in the left breast Also some tenderness along the right chest wall mid axilla on the right side but no breast tenderness Also some upper abdominal tenderness in the right upper quadrant      Assessment & Plan:  Assessment and Plan    Right-sided chest pain Dull, intermittent pain radiating from the front to the back, possibly  musculoskeletal in nature. -Tylenol as needed for pain relief. -Use warm compresses as needed.  Breast pain New onset pain in the right breast and underarm, no signs of infection on examination. -Order diagnostic mammogram. -Recheck in 6-8 weeks if mammogram is reassuring.  Liver cysts History of liver cyst drainage in September, currently experiencing pressure-like discomfort in the area. -Order liver ultrasound to assess cyst size. -Notify patient to report any changes or worsening symptoms.  Cardiac monitoring Patient currently wearing an event monitor due to episodes of rapid heart rate. -Continue monitoring as directed by cardiology. -Interpretation of monitor results pending.      1. Breast pain, left See discussion above - MM 3D DIAGNOSTIC MAMMOGRAM UNILATERAL LEFT BREAST  2. Liver cyst Ultrasound indicated - US Abdomen Limited RUQ (LIVER/GB); Future  3. Essential hypertension Blood pressure better on recheck  4. Musculoskeletal chest pain Tylenol warm compresses follow-up if ongoing troubles

## 2023-07-05 ENCOUNTER — Telehealth: Payer: Self-pay | Admitting: Cardiology

## 2023-07-05 ENCOUNTER — Other Ambulatory Visit (HOSPITAL_COMMUNITY): Payer: Self-pay | Admitting: Family Medicine

## 2023-07-05 DIAGNOSIS — N644 Mastodynia: Secondary | ICD-10-CM

## 2023-07-05 NOTE — Telephone Encounter (Signed)
Patient states today her event monitor is supposed to be removed, but she has been having cramping in her hands/fingers and she is unable to take it off. She would like to know if she can go by the [facility] office to have someone assist her with removing it.

## 2023-07-05 NOTE — Telephone Encounter (Signed)
Patient will stop by at 4 pm for monitor removal

## 2023-07-08 ENCOUNTER — Telehealth: Payer: Self-pay | Admitting: Gastroenterology

## 2023-07-08 DIAGNOSIS — R001 Bradycardia, unspecified: Secondary | ICD-10-CM | POA: Diagnosis not present

## 2023-07-08 NOTE — Telephone Encounter (Signed)
Please remind patient to make appt when she is ready to pursue colonoscopy, needs repeat due to poor bowel prep.

## 2023-07-11 NOTE — Telephone Encounter (Signed)
Pt was made aware and verbalized understanding. Pt stated she will call once she finds out about her liver.

## 2023-07-12 ENCOUNTER — Ambulatory Visit (HOSPITAL_COMMUNITY)
Admission: RE | Admit: 2023-07-12 | Discharge: 2023-07-12 | Disposition: A | Discharge status: Home / Self Care | Payer: Medicare HMO | Source: Ambulatory Visit | Attending: Family Medicine | Admitting: Family Medicine

## 2023-07-12 ENCOUNTER — Telehealth: Payer: Self-pay | Admitting: Family Medicine

## 2023-07-12 DIAGNOSIS — R92323 Mammographic fibroglandular density, bilateral breasts: Secondary | ICD-10-CM | POA: Diagnosis not present

## 2023-07-12 DIAGNOSIS — N644 Mastodynia: Secondary | ICD-10-CM

## 2023-07-12 NOTE — Telephone Encounter (Signed)
Patient notified and verbalized understanding. 

## 2023-07-12 NOTE — Telephone Encounter (Signed)
Please let the patient know that her mammogram did not show any tumors or growths I would recommend follow-up in December as planned No further test indicated currently Patient was made aware of the results of her test by the radiologist

## 2023-07-14 ENCOUNTER — Ambulatory Visit (HOSPITAL_COMMUNITY)
Admission: RE | Admit: 2023-07-14 | Discharge: 2023-07-14 | Disposition: A | Discharge status: Home / Self Care | Payer: Medicare HMO | Source: Ambulatory Visit | Attending: Family Medicine | Admitting: Family Medicine

## 2023-07-14 DIAGNOSIS — K7689 Other specified diseases of liver: Secondary | ICD-10-CM | POA: Diagnosis not present

## 2023-07-14 DIAGNOSIS — Z9049 Acquired absence of other specified parts of digestive tract: Secondary | ICD-10-CM | POA: Diagnosis not present

## 2023-07-20 ENCOUNTER — Other Ambulatory Visit: Payer: Self-pay | Admitting: Family Medicine

## 2023-07-21 NOTE — Telephone Encounter (Signed)
error 

## 2023-07-22 ENCOUNTER — Telehealth: Payer: Self-pay

## 2023-07-22 NOTE — Telephone Encounter (Signed)
Left message for patient to return the call for additional details and recommendations.  Regarding ultrasound test results.

## 2023-07-25 ENCOUNTER — Ambulatory Visit: Payer: Self-pay | Admitting: Family Medicine

## 2023-07-25 NOTE — Telephone Encounter (Signed)
  Chief Complaint: Patient called in for US abdomen results; cough Symptoms: cough, clear sputum Frequency: "on and off for a few days" Pertinent Negatives: Patient denies fever, runny nose, chest pain, wheezing, SOB. Disposition: [] ED /[] Urgent Care (no appt availability in office) / [] Appointment(In office/virtual)/ []  [facility]/ [x] Home Care/ [] Refused Recommended Disposition /[] [facility]/ []  Follow-up with PCP Additional Notes: Patient called back for her US abdomen results. Reviewed them with patient and she verbalizes understanding. Confirmed 12-4 appt with patient. Pt began coughing during conversation and requested home care instructions. Reviewed care advice with patient and she is agreeable to home care.  Copied from CRM [id number]. Topic: Clinical - Lab/Test Results >> Jul 25, 2023  8:35 AM Adelina Mings wrote: Reason for CRM: patient is requesting results Reason for Disposition  Cough  Answer Assessment - Initial Assessment Questions 1. REASON FOR CALL or QUESTION: "What is your reason for calling today?" or "How can I best help you?" or "What question do you have that I can help answer?"     Imaging results from [place] and asking if results are back on mammogram.  Answer Assessment - Initial Assessment Questions 1. ONSET: "When did the cough begin?"      "Its been a few days" 2. SEVERITY: "How bad is the cough today?"      "I feel fine" 3. SPUTUM: "Describe the color of your sputum" (none, dry cough; clear, white, yellow, green)     Clear phlegm  4. HEMOPTYSIS: "Are you coughing up any blood?" If so ask: "How much?" (flecks, streaks, tablespoons, etc.)     No  5. DIFFICULTY BREATHING: "Are you having difficulty breathing?" If Yes, ask: "How bad is it?" (e.g., mild, moderate, severe)    - MILD: No SOB at rest, mild SOB with walking, speaks normally in sentences, can lie down, no retractions, pulse < 100.    - MODERATE: SOB at rest, SOB with minimal exertion  and prefers to sit, cannot lie down flat, speaks in phrases, mild retractions, audible wheezing, pulse 100-120.    - SEVERE: Very SOB at rest, speaks in single words, struggling to breathe, sitting hunched forward, retractions, pulse > 120      No  6. FEVER: "Do you have a fever?" If Yes, ask: "What is your temperature, how was it measured, and when did it start?"     Denies fevers  7. CARDIAC HISTORY: "Do you have any history of heart disease?" (e.g., heart attack, congestive heart failure)      No, "but I have an irregular heart beat" and states she has seen a cardiologist in the past for it.  8. LUNG HISTORY: "Do you have any history of lung disease?"  (e.g., pulmonary embolus, asthma, emphysema)     No  9. PE RISK FACTORS: "Do you have a history of blood clots?" (or: recent major surgery, recent prolonged travel, bedridden)     "I'm on blood thinner, plavix. In my right arm hx of blood clots"  10. OTHER SYMPTOMS: "Do you have any other symptoms?" (e.g., runny nose, wheezing, chest pain)      Denies wheezing, chest pain, runny nose, fevers.  12. TRAVEL: "Have you traveled out of the country in the last month?" (e.g., travel history, exposures)       No.  Protocols used: Information Only Call - No Triage-A-AH, Cough - Acute Productive-A-AH

## 2023-07-27 ENCOUNTER — Telehealth (INDEPENDENT_AMBULATORY_CARE_PROVIDER_SITE_OTHER): Payer: Self-pay | Admitting: Internal Medicine

## 2023-07-27 NOTE — Telephone Encounter (Signed)
yes

## 2023-07-27 NOTE — Telephone Encounter (Signed)
Pt had colonoscopy on 5/20/204 and recommended to repeat in 6 months. Does she need OV prior to scheduling?

## 2023-08-01 ENCOUNTER — Encounter: Payer: Self-pay | Admitting: Internal Medicine

## 2023-08-02 ENCOUNTER — Other Ambulatory Visit (HOSPITAL_COMMUNITY): Payer: Medicare HMO

## 2023-08-02 ENCOUNTER — Encounter (HOSPITAL_COMMUNITY): Payer: Medicare HMO

## 2023-08-03 ENCOUNTER — Ambulatory Visit (INDEPENDENT_AMBULATORY_CARE_PROVIDER_SITE_OTHER): Payer: Medicare HMO | Admitting: Family Medicine

## 2023-08-03 VITALS — BP 124/72 | HR 43 | Temp 98.8°F | Ht 65.0 in | Wt 245.2 lb

## 2023-08-03 DIAGNOSIS — E1169 Type 2 diabetes mellitus with other specified complication: Secondary | ICD-10-CM

## 2023-08-03 DIAGNOSIS — Z79899 Other long term (current) drug therapy: Secondary | ICD-10-CM | POA: Diagnosis not present

## 2023-08-03 DIAGNOSIS — K7689 Other specified diseases of liver: Secondary | ICD-10-CM

## 2023-08-03 DIAGNOSIS — E785 Hyperlipidemia, unspecified: Secondary | ICD-10-CM

## 2023-08-03 DIAGNOSIS — T2012XA Burn of first degree of lip(s), initial encounter: Secondary | ICD-10-CM | POA: Diagnosis not present

## 2023-08-03 DIAGNOSIS — I10 Essential (primary) hypertension: Secondary | ICD-10-CM | POA: Diagnosis not present

## 2023-08-03 DIAGNOSIS — E1140 Type 2 diabetes mellitus with diabetic neuropathy, unspecified: Secondary | ICD-10-CM | POA: Diagnosis not present

## 2023-08-03 NOTE — Progress Notes (Signed)
Subjective:    Patient ID: [name], female    DOB: [birth year], 77 y.o.   MRN: [id number]  Discussed the use of AI scribe software for clinical note transcription with the patient, who gave verbal consent to proceed.  History of Present Illness   The patient, with a history of liver cysts and fibrocystic breast changes, presents with a three-week history of cough and phlegm production, particularly at night. She reports occasional wheezing, especially in the mornings. She denies fever, body aches, and headaches. She has been self-managing with Mucinex and humidifiers.  The patient also reports intermittent right upper abdominal pain, described as a soreness, which is exacerbated by lifting heavy objects. This pain is associated with a known liver cyst, which has been previously drained but remains present.  In addition, the patient experienced a recent burn to the lip due to consuming a hot beverage, causing discomfort and visible changes.  The patient's ongoing management includes blood pressure medication, cholesterol medication, and a diabetes medication, all of which she reports taking regularly. She has been advised to undergo routine blood work for diabetes management.  The patient also has a history of fibrocystic breast changes, which are being monitored with regular screening mammograms. No nodules or growths have been identified on recent ultrasound.  The patient's lifestyle includes cooking and hosting family events, which she reports as physically demanding but enjoyable. She has been consuming Pedialyte and electrolyte drinks, which she believes help prevent weakness, although she has been advised to discontinue these.         Review of Systems     Objective:    Physical Exam   VITALS: BP- 124/72 CHEST: lungs clear to auscultation CARDIOVASCULAR: normal heart sounds EXTREMITIES: No edema           Assessment & Plan:  Assessment and Plan    Upper  Respiratory Infection Persistent cough and phlegm for three weeks, worse at night. No fever, body aches, or headaches. Wheezing noted in the mornings. Lungs clear on examination. -Continue Mucinex as needed. -Use humidifier and keep environment cool at night. -If symptoms persist beyond the first week of January, patient to notify clinic.  Thermal Burn to Lip Patient sustained a burn to the lip from hot tea. -Apply Blistex for symptomatic relief.  Fibrocystic Breast Changes No nodules or growths identified on recent ultrasound. No recommendation for biopsy. -Monitor for worsening symptoms and notify clinic if changes occur. -Continue with yearly screening mammograms.  Liver Cyst Patient reports intermittent pain, exacerbated by certain movements or lifting. Cyst size has decreased but is still present. -Consult with gastroenterologist regarding future management and timing of next ultrasound. -Tolerate discomfort as long as possible before considering repeat drainage procedure.  Hypertension and Hyperlipidemia Patient reports adherence to blood pressure and cholesterol medications. -Continue current medications.  Diabetes Patient reports adherence to diabetes medication. Last blood work done in July. -Order labs for diabetes management, to be done this month or in January.  General Health Maintenance -Order urine specimen to check protein in kidneys. If unable to provide specimen at lab, obtain at next clinic visit. -Schedule follow-up visit in four months.     1. Essential hypertension Blood pressure good control check metabolic 7 continue medication - Basic Metabolic Panel  2. Type 2 diabetes mellitus with diabetic neuropathy, without long-term current use of insulin (HCC) Continue Januvia check lab work - Hemoglobin A1c - Microalbumin/Creatinine Ratio, Urine  3. Hyperlipidemia associated with type 2 diabetes mellitus (HCC) Continue  statin check lab work - Lipid  Panel  4. Liver cyst More than likely will need a follow-up liver ultrasound again.  3 to 6 months will touch base with gastroenterology patient relates some mild symptoms but not severe enough to go through additional drainage at this time - Hepatic Function Panel  5. High risk medication use Liver function test ordered - Hepatic Function Panel  6. Superficial burn of lip, initial encounter Vaseline as needed  Breast pain only occasional ultrasound looks good mammogram recommended yearly basis follow-up sooner if any problems  Follow-up 4 months

## 2023-08-05 ENCOUNTER — Encounter: Payer: Self-pay | Admitting: Family Medicine

## 2023-08-05 LAB — LIPID PANEL
Chol/HDL Ratio: 3 {ratio} (ref 0.0–4.4)
Cholesterol, Total: 143 mg/dL (ref 100–199)
HDL: 47 mg/dL (ref >39)
LDL Chol Calc (NIH): 79 mg/dL (ref 0–99)
Triglycerides: 93 mg/dL (ref 0–149)
VLDL Cholesterol Cal: 17 mg/dL (ref 5–40)

## 2023-08-05 LAB — BASIC METABOLIC PANEL
BUN/Creatinine Ratio: 18 (ref 12–28)
BUN: 15 mg/dL (ref 8–27)
CO2: 24 mmol/L (ref 20–29)
Calcium: 9.2 mg/dL (ref 8.7–10.3)
Chloride: 106 mmol/L (ref 96–106)
Creatinine, Ser: 0.85 mg/dL (ref 0.57–1.00)
Glucose: 109 mg/dL — ABNORMAL HIGH (ref 70–99)
Potassium: 4.3 mmol/L (ref 3.5–5.2)
Sodium: 144 mmol/L (ref 134–144)
eGFR: 71 mL/min/{1.73_m2} (ref >59)

## 2023-08-05 LAB — MICROALBUMIN / CREATININE URINE RATIO
Creatinine, Urine: 254.1 mg/dL
Microalb/Creat Ratio: 17 mg/g{creat} (ref 0–29)
Microalbumin, Urine: 43.8 ug/mL

## 2023-08-05 LAB — HEPATIC FUNCTION PANEL
ALT: 16 [IU]/L (ref 0–32)
AST: 18 [IU]/L (ref 0–40)
Albumin: 4.2 g/dL (ref 3.8–4.8)
Alkaline Phosphatase: 81 [IU]/L (ref 44–121)
Bilirubin Total: 0.3 mg/dL (ref 0.0–1.2)
Bilirubin, Direct: 0.13 mg/dL (ref 0.00–0.40)
Total Protein: 6.9 g/dL (ref 6.0–8.5)

## 2023-08-05 LAB — HEMOGLOBIN A1C
Est. average glucose Bld gHb Est-mCnc: 163 mg/dL
Hgb A1c MFr Bld: 7.3 % — ABNORMAL HIGH (ref 4.8–5.6)

## 2023-08-15 DIAGNOSIS — H524 Presbyopia: Secondary | ICD-10-CM | POA: Diagnosis not present

## 2023-08-15 DIAGNOSIS — Z0100 Encounter for examination of eyes and vision without abnormal findings: Secondary | ICD-10-CM | POA: Diagnosis not present

## 2023-08-15 LAB — HM DIABETES EYE EXAM

## 2023-08-25 ENCOUNTER — Other Ambulatory Visit: Payer: Self-pay | Admitting: Family Medicine

## 2023-09-05 ENCOUNTER — Ambulatory Visit: Payer: Medicare HMO | Admitting: Gastroenterology

## 2023-09-05 NOTE — Progress Notes (Deleted)
 GI Office Note    Referring Provider: Alphonsa Glendia LABOR, MD Primary Care Physician:  Alphonsa Glendia LABOR, MD  Primary Gastroenterologist: Carlin POUR. Cindie, DO   Chief Complaint   No chief complaint on file.   History of Present Illness   [name] is a 79 y.o. female presenting today      H/o IBS-C, diverticulitis,large 14.6X10.2 cm subcapsular cyst of liver dome s/p drainage due to abdominal pain presented to schedule colonoscopy, due to prior poor bowel prep.   Received suprep and Linzess  145mcg daily for five days prior to bowel prep.  CT abdomen pelvis with contrast March 28, 2023: IMPRESSION: 1. No acute findings within the abdomen or pelvis. No bowel obstruction or evidence of bowel wall inflammation. No evidence of acute solid organ abnormality. No renal or ureteral calculi. Appendix appears normal. 2. Extensive diverticulosis of the descending and sigmoid colon but no evidence of acute diverticulitis. 3. Status post cholecystectomy. No bile duct dilatation. 4.  Large subcapsular cyst of the upper right liver appears stable in size, smaller cyst within the left liver lobe also appears stable.   MRI abdomen with and without contrast March 08, 2023: IMPRESSION: 1. Benign, nonenhancing hemorrhagic or proteinaceous cyst arising from the posterior inferior pole of the left kidney, measuring 1.0 x 0.7 cm and corresponding to lesion identified by prior CT. No further follow-up or characterization is required for this benign Bosniak category II cyst. 2. Low-attenuation lesions described by prior CT correspond to small, benign fatty interdigitations of the pancreatic tail. No further follow-up or characterization is required. 3. Unchanged large, subcapsular cyst of the liver dome with intrinsically T1 hyperintense hemorrhagic or proteinaceous contents measuring 10.2 cm at 14.6 cm. Although definitively benign, this may be symptomatic due to large size and mass  effect, and may post some risk of hemorrhage or rupture. 4. Hepatic steatosis. 5. Pancolonic diverticulosis.   Colonoscopy May 2024: -Prep of the colon was inadequate -Nonbleeding internal hemorrhoids -Diverticulosis in the sigmoid colon, descending colon, transverse colon -one 5 mm polyp in the ascending colon removed -Stool in the proximal transverse colon, ascending colon, cecum -Colonoscopy in 6 months due to poor bowel prep   Medications   Current Outpatient Medications  Medication Sig Dispense Refill   acetaminophen  (TYLENOL ) 500 MG tablet Take 1,000 mg by mouth every 6 (six) hours as needed for mild pain, moderate pain or headache.      Cholecalciferol (VITAMIN D-3) 125 MCG (5000 UT) TABS Take 5,000 Units by mouth daily.     clopidogrel  (PLAVIX ) 75 MG tablet TAKE 1 TABLET EVERY DAY 90 tablet 3   diclofenac  Sodium (VOLTAREN ) 1 % GEL Apply 2 g topically 4 (four) times daily. Rub into affected area of foot 2 to 4 times daily 100 g 2   furosemide  (LASIX ) 20 MG tablet Take 1 tablet (20 mg total) by mouth daily as needed (swelling). 20 tablet 0   JANUVIA  50 MG tablet TAKE 1 TABLET (50 MG TOTAL) BY MOUTH DAILY. 90 tablet 3   latanoprost (XALATAN) 0.005 % ophthalmic solution Place 1 drop into both eyes at bedtime.     linaclotide  (LINZESS ) 290 MCG CAPS capsule Take 1 capsule (290 mcg total) by mouth daily before breakfast. 90 capsule 3   losartan  (COZAAR ) 25 MG tablet TAKE 1 TABLET EVERY DAY 90 tablet 3   pantoprazole  (PROTONIX ) 40 MG tablet TAKE 1 TABLET EVERY DAY 90 tablet 3   pravastatin  (PRAVACHOL ) 20 MG tablet TAKE  1 TABLET EVERY EVENING ( DOSE DECREASE ) 90 tablet 3   PRODIGY NO CODING BLOOD GLUC test strip TEST BLOOD SUGAR AS INSTRUCTED 100 strip 3   No current facility-administered medications for this visit.    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 mild CAD by cath in 2005 and 2012 b. low-risk NST's in 07/2016 and 05/2019   Cyst on liver   Depression    Diabetes mellitus    controlled by diet   Diverticulitis    DVT of axillary vein, acute right (HCC) 11/24/2012   Dx on Jan 28, 2012.     Dysrhythmia    Fibrocystic breast disease    Hepatic cyst    Hypertension    IBS (irritable bowel syndrome)    Impaired glucose tolerance    Menopause    Tachycardia     Past Surgical History   Past Surgical History:  Procedure Laterality Date   ABDOMINAL HYSTERECTOMY     BACK SURGERY     blood clot Right 2014   arm   BREAST SURGERY  right   cyst removed   CARDIAC CATHETERIZATION  2010   with stent   CAST APPLICATION  02/14/2012   Procedure: CAST APPLICATION;  Surgeon: Taft FORBES Minerva, MD;  Location: [facility];  Service: Orthopedics;  Laterality: Right;  procedure room    CHOLECYSTECTOMY     COLONOSCOPY  12/04/2004   NUR:Few small diverticula at sigmoid colon/Small cecal polyp ablated    COLONOSCOPY N/A 09/24/2014   SLF: small internal hemorrhoids   COLONOSCOPY N/A 02/15/2020   Procedure: COLONOSCOPY;  Surgeon: Shaaron Lamar HERO, MD;  [facility]: [facility];  Service: Endoscopy;  Laterality: N/A;  8:30am   COLONOSCOPY WITH PROPOFOL  N/A 01/17/2023   Procedure: COLONOSCOPY WITH PROPOFOL ;  Surgeon: Cindie Carlin POUR, DO;  [facility];  Service: Endoscopy;  Laterality: N/A;  1:00 pm, ASA 3   ESOPHAGOGASTRODUODENOSCOPY N/A 02/02/2016   Procedure: ESOPHAGOGASTRODUODENOSCOPY (EGD);  Surgeon: Margo LITTIE Haddock, MD;  [facility]: [facility];  Service: Endoscopy;  Laterality: N/A;  930     EXTERNAL FIXATION ANKLE FRACTURE     KNEE ARTHROCENTESIS  left   POLYPECTOMY  02/15/2020   Procedure: POLYPECTOMY;  Surgeon: Shaaron Lamar HERO, MD;  [facility]: [facility];  Service: Endoscopy;;   POLYPECTOMY  01/17/2023   Procedure: POLYPECTOMY;  Surgeon: Cindie Carlin POUR, DO;  [facility];  Service: Endoscopy;;   TUBAL LIGATION      Past Family History   Family History  Problem Relation Age of Onset   Stroke Mother    Heart attack Mother    Cancer Sister    Diabetes Sister    Seizures Brother    Diabetes Brother    Heart disease Brother    Kidney disease Son    Colon cancer Neg Hx     Past Social History   Social History   Socioeconomic History   Marital status: Widowed    Spouse name:  Not on file   Number of children: 2   Years of education: Not on file   Highest education level: Not on file  Occupational History   Occupation: Retired  Tobacco Use   Smoking status: Never   Smokeless tobacco: Never   Tobacco comments:    Never smoked  Vaping Use   Vaping status: Never Used  Substance and Sexual Activity   Alcohol use: No    Alcohol/week: 0.0 standard drinks of alcohol   Drug use: No   Sexual activity: Yes  Other Topics Concern   Not on file  Social History Narrative   1 son and 1 daughter live close by.   Social Drivers of Corporate Investment Banker Strain: Low Risk  (06/02/2021)   Overall Financial Resource Strain (CARDIA)    Difficulty of Paying Living Expenses: Not hard at all  Food Insecurity: No Food Insecurity (06/02/2021)   Hunger Vital Sign    Worried About Running Out of Food in the Last Year: Never true    Ran Out of Food in the Last Year: Never true  Transportation Needs: No Transportation Needs (04/04/2023)   PRAPARE - Administrator, Civil Service (Medical): No    Lack of Transportation (Non-Medical): No  Physical Activity: Insufficiently Active (06/02/2021)   Exercise Vital Sign    Days of Exercise per Week: 2  days    Minutes of Exercise per Session: 30 min  Stress: No Stress Concern Present (06/02/2021)   [employer] of Occupational Health - Occupational Stress Questionnaire    Feeling of Stress : Not at all  Social Connections: Moderately Integrated (06/02/2021)   Social Connection and Isolation Panel [NHANES]    Frequency of Communication with Friends and Family: More than three times a week    Frequency of Social Gatherings with Friends and Family: More than three times a week    Attends Religious Services: More than 4 times per year    Active Member of [employer] or Organizations: Yes    Attends Banker Meetings: More than 4 times per year    Marital Status: Widowed  Intimate Partner Violence: Not At Risk (06/02/2021)   Humiliation, Afraid, Rape, and Kick questionnaire    Fear of Current or Ex-Partner: No    Emotionally Abused: No    Physically Abused: No    Sexually Abused: No    Review of Systems   General: Negative for anorexia, weight loss, fever, chills, fatigue, weakness. ENT: Negative for hoarseness, difficulty swallowing , nasal congestion. CV: Negative for chest pain, angina, palpitations, dyspnea on exertion, peripheral edema.  Respiratory: Negative for dyspnea at rest, dyspnea on exertion, cough, sputum, wheezing.  GI: See history of present illness. GU:  Negative for dysuria, hematuria, urinary incontinence, urinary frequency, nocturnal urination.  Endo: Negative for unusual weight change.     Physical Exam   There were no vitals taken for this visit.   General: Well-nourished, well-developed in no acute distress.  Eyes: No icterus. Mouth: Oropharyngeal mucosa moist and pink , no lesions erythema or exudate. Lungs: Clear to auscultation bilaterally.  Heart: Regular rate and rhythm, no murmurs rubs or gallops.  Abdomen: Bowel sounds are normal, nontender, nondistended, no hepatosplenomegaly or masses,  no abdominal bruits or hernia , no rebound or  guarding.  Rectal: ***  Extremities: No lower extremity edema. No clubbing or deformities. Neuro: Alert and oriented x 4   Skin: Warm and dry, no jaundice.   Psych: Alert and  cooperative, normal mood and affect.  Labs   *** Imaging Studies   No results found.  Assessment       PLAN   ***   Sonny RAMAN. Ezzard, MHS, PA-C [facility]

## 2023-09-18 NOTE — Progress Notes (Unsigned)
GI Office Note    Referring Provider: Babs Sciara, MD Primary Care Physician:  Babs Sciara, MD  Primary Gastroenterologist:  Chief Complaint   No chief complaint on file.   History of Present Illness   [name] is a 78 y.o. female presenting today      H/o IBS-C, diverticulitis,large 14.6X10.2 cm subcapsular cyst of liver dome s/p drainage due to abdominal pain presented to schedule colonoscopy, due to prior poor bowel prep.    Received suprep and Linzess daily for five days prior to bowel prep.   CT abdomen pelvis with contrast March 28, 2023: IMPRESSION: 1. No acute findings within the abdomen or pelvis. No bowel obstruction or evidence of bowel wall inflammation. No evidence of acute solid organ abnormality. No renal or ureteral calculi. Appendix appears normal. 2. Extensive diverticulosis of the descending and sigmoid colon but no evidence of acute diverticulitis. 3. Status post cholecystectomy. No bile duct dilatation. 4.  Large subcapsular cyst of the upper right liver appears stable in size, smaller cyst within the left liver lobe also appears stable.   MRI abdomen with and without contrast March 08, 2023: IMPRESSION: 1. Benign, nonenhancing hemorrhagic or proteinaceous cyst arising from the posterior inferior pole of the left kidney, measuring 1.0 x 0.7 cm and corresponding to lesion identified by prior CT. No further follow-up or characterization is required for this benign Bosniak category II cyst. 2. Low-attenuation lesions described by prior CT correspond to small, benign fatty interdigitations of the pancreatic tail. No further follow-up or characterization is required. 3. Unchanged large, subcapsular cyst of the liver dome with intrinsically T1 hyperintense hemorrhagic or proteinaceous contents measuring 10.2 cm at 14.6 cm. Although definitively benign, this may be symptomatic due to large size and mass effect, and may post  some risk of hemorrhage or rupture. 4. Hepatic steatosis. 5. Pancolonic diverticulosis.   Colonoscopy May 2024: -Prep of the colon was inadequate -Nonbleeding internal hemorrhoids -Diverticulosis in the sigmoid colon, descending colon, transverse colon -one 5 mm polyp in the ascending colon removed -Stool in the proximal transverse colon, ascending colon, cecum -Colonoscopy in 6 months due to poor bowel prep     Medications   Current Outpatient Medications  Medication Sig Dispense Refill   acetaminophen (TYLENOL) 500 MG tablet Take 1,000 mg by mouth every 6 (six) hours as needed for mild pain, moderate pain or headache.      Cholecalciferol (VITAMIN D-3) 125 MCG (5000 UT) TABS Take 5,000 Units by mouth daily.     clopidogrel (PLAVIX) 75 MG tablet TAKE 1 TABLET EVERY DAY 90 tablet 3   diclofenac Sodium (VOLTAREN) 1 % GEL Apply 2 g topically 4 (four) times daily. Rub into affected area of foot 2 to 4 times daily 100 g 2   furosemide (LASIX) 20 MG tablet Take 1 tablet (20 mg total) by mouth daily as needed (swelling). 20 tablet 0   JANUVIA 50 MG tablet TAKE 1 TABLET (50 MG TOTAL) BY MOUTH DAILY. 90 tablet 3   latanoprost (XALATAN) 0.005 % ophthalmic solution Place 1 drop into both eyes at bedtime.     linaclotide (LINZESS) 290 MCG CAPS capsule Take 1 capsule (290 mcg total) by mouth daily before breakfast. 90 capsule 3   losartan (COZAAR) 25 MG tablet TAKE 1 TABLET EVERY DAY 90 tablet 3   pantoprazole (PROTONIX) 40 MG tablet TAKE 1 TABLET EVERY DAY 90 tablet 3   pravastatin (PRAVACHOL) 20 MG tablet TAKE 1  TABLET EVERY EVENING ( DOSE DECREASE ) 90 tablet 3   PRODIGY NO CODING BLOOD GLUC test strip TEST BLOOD SUGAR AS INSTRUCTED 100 strip 3   No current facility-administered medications for this visit.    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 mild CAD by cath in 2005 and 2012 b. low-risk NST's in 07/2016 and 05/2019   Cyst on liver   Depression    Diabetes mellitus    controlled by diet   Diverticulitis    DVT of axillary vein, acute right (HCC) 11/24/2012   Dx on Jan 28, 2012.     Dysrhythmia    Fibrocystic breast disease    Hepatic cyst    Hypertension    IBS (irritable bowel syndrome)    Impaired glucose tolerance    Menopause    Tachycardia     Past Surgical History   Past Surgical History:  Procedure Laterality Date   ABDOMINAL HYSTERECTOMY     BACK SURGERY     blood clot Right 2014   arm   BREAST SURGERY  right   cyst removed   CARDIAC CATHETERIZATION  2010   with stent   CAST APPLICATION  02/14/2012   Procedure: CAST APPLICATION;  Surgeon: Vickki Hearing, MD;  Location: [facility];  Service: Orthopedics;  Laterality: Right;  procedure room    CHOLECYSTECTOMY     COLONOSCOPY  12/04/2004   NUR:Few small diverticula at sigmoid colon/Small cecal polyp ablated    COLONOSCOPY N/A 09/24/2014   SLF: small internal hemorrhoids   COLONOSCOPY N/A 02/15/2020   Procedure: COLONOSCOPY;  Surgeon: Corbin Ade, MD;  [facility];  Service: Endoscopy;  Laterality: N/A;  8:30am   COLONOSCOPY WITH PROPOFOL N/A 01/17/2023   Procedure: COLONOSCOPY WITH PROPOFOL;  Surgeon: Lanelle Bal, DO;  [facility];  Service: Endoscopy;  Laterality: N/A;  1:00 pm, ASA 3   ESOPHAGOGASTRODUODENOSCOPY N/A 02/02/2016   Procedure: ESOPHAGOGASTRODUODENOSCOPY (EGD);  Surgeon: West Bali, MD;  [facility];  Service: Endoscopy;  Laterality: N/A;  930    EXTERNAL FIXATION  ANKLE FRACTURE     KNEE ARTHROCENTESIS  left   POLYPECTOMY  02/15/2020   Procedure: POLYPECTOMY;  Surgeon: Corbin Ade, MD;  [facility];  Service: Endoscopy;;   POLYPECTOMY  01/17/2023   Procedure: POLYPECTOMY;  Surgeon: Lanelle Bal, DO;  [facility];  Service: Endoscopy;;   TUBAL LIGATION      Past Family History   Family History  Problem Relation Age of Onset   Stroke Mother    Heart attack Mother    Cancer Sister    Diabetes Sister    Seizures Brother    Diabetes Brother    Heart disease Brother    Kidney disease Son    Colon cancer Neg Hx     Past Social History   Social History   Socioeconomic History   Marital status: Widowed    Spouse name: Not  on file   Number of children: 2   Years of education: Not on file   Highest education level: Not on file  Occupational History   Occupation: Retired  Tobacco Use   Smoking status: Never   Smokeless tobacco: Never   Tobacco comments:    Never smoked  Vaping Use   Vaping status: Never Used  Substance and Sexual Activity   Alcohol use: No    Alcohol/week: 0.0 standard drinks of alcohol   Drug use: No   Sexual activity: Yes  Other Topics Concern   Not on file  Social History Narrative   1 son and 1 daughter live close by.   Social Drivers of Corporate investment banker Strain: Low Risk  (06/02/2021)   Overall Financial Resource Strain (CARDIA)    Difficulty of Paying Living Expenses: Not hard at all  Food Insecurity: No Food Insecurity (06/02/2021)   Hunger Vital Sign    Worried About Running Out of Food in the Last Year: Never true    Ran Out of Food in the Last Year: Never true  Transportation Needs: No Transportation Needs (04/04/2023)   PRAPARE - Administrator, Civil Service (Medical): No    Lack of Transportation (Non-Medical): No  Physical Activity: Insufficiently Active (06/02/2021)   Exercise Vital Sign    Days of Exercise per Week: 2 days    Minutes of  Exercise per Session: 30 min  Stress: No Stress Concern Present (06/02/2021)   [employer] of Occupational Health - Occupational Stress Questionnaire    Feeling of Stress : Not at all  Social Connections: Moderately Integrated (06/02/2021)   Social Connection and Isolation Panel [NHANES]    Frequency of Communication with Friends and Family: More than three times a week    Frequency of Social Gatherings with Friends and Family: More than three times a week    Attends Religious Services: More than 4 times per year    Active Member of [employer] or Organizations: Yes    Attends Banker Meetings: More than 4 times per year    Marital Status: Widowed  Intimate Partner Violence: Not At Risk (06/02/2021)   Humiliation, Afraid, Rape, and Kick questionnaire    Fear of Current or Ex-Partner: No    Emotionally Abused: No    Physically Abused: No    Sexually Abused: No    Review of Systems   General: Negative for anorexia, weight loss, fever, chills, fatigue, weakness. ENT: Negative for hoarseness, difficulty swallowing , nasal congestion. CV: Negative for chest pain, angina, palpitations, dyspnea on exertion, peripheral edema.  Respiratory: Negative for dyspnea at rest, dyspnea on exertion, cough, sputum, wheezing.  GI: See history of present illness. GU:  Negative for dysuria, hematuria, urinary incontinence, urinary frequency, nocturnal urination.  Endo: Negative for unusual weight change.     Physical Exam   There were no vitals taken for this visit.   General: Well-nourished, well-developed in no acute distress.  Eyes: No icterus. Mouth: Oropharyngeal mucosa moist and pink , no lesions erythema or exudate. Lungs: Clear to auscultation bilaterally.  Heart: Regular rate and rhythm, no murmurs rubs or gallops.  Abdomen: Bowel sounds are normal, nontender, nondistended, no hepatosplenomegaly or masses,  no abdominal bruits or hernia , no rebound or guarding.  Rectal: ***   Extremities: No lower extremity edema. No clubbing or deformities. Neuro: Alert and oriented x 4   Skin: Warm and dry, no jaundice.   Psych: Alert and cooperative,  normal mood and affect.  Labs   *** Imaging Studies   No results found.  Assessment       PLAN   ***   Leanna Battles. Melvyn Neth, MHS, PA-C [facility]

## 2023-09-19 ENCOUNTER — Encounter: Payer: Self-pay | Admitting: Gastroenterology

## 2023-09-19 ENCOUNTER — Ambulatory Visit (INDEPENDENT_AMBULATORY_CARE_PROVIDER_SITE_OTHER): Payer: Medicare HMO | Admitting: Gastroenterology

## 2023-09-19 VITALS — BP 139/75 | HR 67 | Temp 97.5°F | Ht 64.0 in | Wt 249.2 lb

## 2023-09-19 DIAGNOSIS — Z860101 Personal history of adenomatous and serrated colon polyps: Secondary | ICD-10-CM

## 2023-09-19 DIAGNOSIS — R1011 Right upper quadrant pain: Secondary | ICD-10-CM

## 2023-09-19 DIAGNOSIS — K7689 Other specified diseases of liver: Secondary | ICD-10-CM

## 2023-09-19 DIAGNOSIS — K219 Gastro-esophageal reflux disease without esophagitis: Secondary | ICD-10-CM | POA: Diagnosis not present

## 2023-09-19 DIAGNOSIS — K581 Irritable bowel syndrome with constipation: Secondary | ICD-10-CM

## 2023-09-19 DIAGNOSIS — R933 Abnormal findings on diagnostic imaging of other parts of digestive tract: Secondary | ICD-10-CM

## 2023-09-19 NOTE — Patient Instructions (Addendum)
Colonoscopy to be scheduled in the near future. We will plan for right upper quadrant ultrasound to reassess large liver cyst in April 2025. Continue Linzess daily for constipation. Please reach out if any questions or concerns.

## 2023-09-20 ENCOUNTER — Encounter: Payer: Self-pay | Admitting: *Deleted

## 2023-09-20 ENCOUNTER — Telehealth: Payer: Self-pay | Admitting: *Deleted

## 2023-09-20 ENCOUNTER — Other Ambulatory Visit: Payer: Self-pay | Admitting: *Deleted

## 2023-09-20 MED ORDER — PEG 3350-KCL-NA BICARB-NACL 420 G PO SOLR: 4000.0000 mL | Freq: Once | ORAL | 0 refills | Status: AC

## 2023-09-20 NOTE — Telephone Encounter (Signed)
[employer] PA: Approved Authorization #[id number]  Tracking #[id number] Dates of service 10/10/2023 - 01/06/2024

## 2023-09-24 ENCOUNTER — Other Ambulatory Visit: Payer: Self-pay | Admitting: Family Medicine

## 2023-09-24 ENCOUNTER — Other Ambulatory Visit: Payer: Self-pay | Admitting: Student

## 2023-09-26 ENCOUNTER — Other Ambulatory Visit: Payer: Self-pay

## 2023-09-26 DIAGNOSIS — I10 Essential (primary) hypertension: Secondary | ICD-10-CM

## 2023-09-26 MED ORDER — LOSARTAN POTASSIUM 25 MG PO TABS
25.0000 mg | ORAL_TABLET | Freq: Every day | ORAL | 3 refills | Status: DC
Start: 2023-09-26 — End: 2023-12-02

## 2023-10-05 ENCOUNTER — Ambulatory Visit: Payer: Medicare HMO | Admitting: Cardiology

## 2023-10-07 ENCOUNTER — Encounter (HOSPITAL_COMMUNITY): Payer: Self-pay

## 2023-10-07 ENCOUNTER — Encounter (HOSPITAL_COMMUNITY)
Admission: RE | Admit: 2023-10-07 | Discharge: 2023-10-07 | Disposition: A | Discharge status: Home / Self Care | Payer: Medicare HMO | Source: Ambulatory Visit | Attending: Internal Medicine | Admitting: Internal Medicine

## 2023-10-07 HISTORY — DX: Bronchitis, not specified as acute or chronic: J40

## 2023-10-10 ENCOUNTER — Ambulatory Visit (HOSPITAL_COMMUNITY): Payer: Medicare HMO | Admitting: Anesthesiology

## 2023-10-10 ENCOUNTER — Ambulatory Visit (HOSPITAL_COMMUNITY)
Admission: RE | Admit: 2023-10-10 | Discharge: 2023-10-10 | Disposition: A | Discharge status: Home / Self Care | Payer: Medicare HMO | Attending: Internal Medicine | Admitting: Internal Medicine

## 2023-10-10 ENCOUNTER — Encounter (HOSPITAL_COMMUNITY)
Admission: RE | Disposition: A | Discharge status: Home / Self Care | Payer: Self-pay | Source: Home / Self Care | Attending: Internal Medicine

## 2023-10-10 ENCOUNTER — Encounter (HOSPITAL_COMMUNITY): Payer: Self-pay | Admitting: Internal Medicine

## 2023-10-10 ENCOUNTER — Other Ambulatory Visit: Payer: Self-pay

## 2023-10-10 DIAGNOSIS — E1151 Type 2 diabetes mellitus with diabetic peripheral angiopathy without gangrene: Secondary | ICD-10-CM | POA: Insufficient documentation

## 2023-10-10 DIAGNOSIS — K2970 Gastritis, unspecified, without bleeding: Secondary | ICD-10-CM | POA: Insufficient documentation

## 2023-10-10 DIAGNOSIS — D124 Benign neoplasm of descending colon: Secondary | ICD-10-CM | POA: Insufficient documentation

## 2023-10-10 DIAGNOSIS — Z79899 Other long term (current) drug therapy: Secondary | ICD-10-CM | POA: Insufficient documentation

## 2023-10-10 DIAGNOSIS — I10 Essential (primary) hypertension: Secondary | ICD-10-CM | POA: Insufficient documentation

## 2023-10-10 DIAGNOSIS — Z791 Long term (current) use of non-steroidal anti-inflammatories (NSAID): Secondary | ICD-10-CM | POA: Insufficient documentation

## 2023-10-10 DIAGNOSIS — K760 Fatty (change of) liver, not elsewhere classified: Secondary | ICD-10-CM | POA: Insufficient documentation

## 2023-10-10 DIAGNOSIS — K279 Peptic ulcer, site unspecified, unspecified as acute or chronic, without hemorrhage or perforation: Secondary | ICD-10-CM | POA: Insufficient documentation

## 2023-10-10 DIAGNOSIS — M1990 Unspecified osteoarthritis, unspecified site: Secondary | ICD-10-CM | POA: Diagnosis not present

## 2023-10-10 DIAGNOSIS — Z86718 Personal history of other venous thrombosis and embolism: Secondary | ICD-10-CM | POA: Insufficient documentation

## 2023-10-10 DIAGNOSIS — Z860100 Personal history of colon polyps, unspecified: Secondary | ICD-10-CM | POA: Diagnosis not present

## 2023-10-10 DIAGNOSIS — K7689 Other specified diseases of liver: Secondary | ICD-10-CM | POA: Diagnosis not present

## 2023-10-10 DIAGNOSIS — Z7902 Long term (current) use of antithrombotics/antiplatelets: Secondary | ICD-10-CM | POA: Diagnosis not present

## 2023-10-10 DIAGNOSIS — D123 Benign neoplasm of transverse colon: Secondary | ICD-10-CM | POA: Diagnosis not present

## 2023-10-10 DIAGNOSIS — K648 Other hemorrhoids: Secondary | ICD-10-CM | POA: Insufficient documentation

## 2023-10-10 DIAGNOSIS — Z1211 Encounter for screening for malignant neoplasm of colon: Secondary | ICD-10-CM

## 2023-10-10 DIAGNOSIS — E66813 Obesity, class 3: Secondary | ICD-10-CM | POA: Diagnosis not present

## 2023-10-10 DIAGNOSIS — Z7984 Long term (current) use of oral hypoglycemic drugs: Secondary | ICD-10-CM | POA: Diagnosis not present

## 2023-10-10 DIAGNOSIS — K5730 Diverticulosis of large intestine without perforation or abscess without bleeding: Secondary | ICD-10-CM | POA: Insufficient documentation

## 2023-10-10 DIAGNOSIS — Z6841 Body Mass Index (BMI) 40.0 and over, adult: Secondary | ICD-10-CM | POA: Diagnosis not present

## 2023-10-10 DIAGNOSIS — Z833 Family history of diabetes mellitus: Secondary | ICD-10-CM | POA: Diagnosis not present

## 2023-10-10 DIAGNOSIS — K219 Gastro-esophageal reflux disease without esophagitis: Secondary | ICD-10-CM | POA: Insufficient documentation

## 2023-10-10 DIAGNOSIS — K635 Polyp of colon: Secondary | ICD-10-CM | POA: Diagnosis not present

## 2023-10-10 DIAGNOSIS — E119 Type 2 diabetes mellitus without complications: Secondary | ICD-10-CM | POA: Diagnosis not present

## 2023-10-10 HISTORY — PX: POLYPECTOMY: SHX5525

## 2023-10-10 HISTORY — PX: COLONOSCOPY WITH PROPOFOL: SHX5780

## 2023-10-10 LAB — GLUCOSE, CAPILLARY: Glucose-Capillary: 105 mg/dL — ABNORMAL HIGH (ref 70–99)

## 2023-10-10 SURGERY — COLONOSCOPY WITH PROPOFOL
Anesthesia: General

## 2023-10-10 MED ORDER — PROPOFOL 10 MG/ML IV BOLUS
INTRAVENOUS | Status: DC | PRN
  Administered 2023-10-10: 50 mg via INTRAVENOUS
  Administered 2023-10-10: 20 mg via INTRAVENOUS

## 2023-10-10 MED ORDER — PROPOFOL 500 MG/50ML IV EMUL
INTRAVENOUS | Status: DC | PRN
  Administered 2023-10-10: 200 ug/kg/min via INTRAVENOUS

## 2023-10-10 MED ORDER — STERILE WATER FOR IRRIGATION IR SOLN
Status: DC | PRN
  Administered 2023-10-10: 60 mL

## 2023-10-10 MED ORDER — EPHEDRINE SULFATE-NACL 50-0.9 MG/10ML-% IV SOSY
PREFILLED_SYRINGE | INTRAVENOUS | Status: DC | PRN
  Administered 2023-10-10 (×2): 10 mg via INTRAVENOUS

## 2023-10-10 MED ORDER — LACTATED RINGERS IV SOLN: INTRAVENOUS | Status: DC | PRN

## 2023-10-10 NOTE — Anesthesia Postprocedure Evaluation (Signed)
 Anesthesia Post Note  Patient: [name]  Procedure(s) Performed: COLONOSCOPY WITH PROPOFOL  POLYPECTOMY  Patient location during evaluation: PACU Anesthesia Type: General Level of consciousness: awake and alert Pain management: pain level controlled Vital Signs Assessment: post-procedure vital signs reviewed and stable Respiratory status: spontaneous breathing, nonlabored ventilation, respiratory function stable and patient connected to nasal cannula oxygen  Cardiovascular status: blood pressure returned to baseline and stable Postop Assessment: no apparent nausea or vomiting Anesthetic complications: no   No notable events documented.   Last Vitals:  Vitals:   10/10/23 1109 10/10/23 1244  BP: (!) 144/74 (!) 131/54  Pulse: 66 96  Resp: 19 (!) 26  Temp: 36.9 C 36.5 C  SpO2: 96% 100%    Last Pain:  Vitals:   10/10/23 1244  TempSrc: Oral  PainSc: 0-No pain                 Freddi Jaeger

## 2023-10-10 NOTE — Op Note (Signed)
 [facility] Patient Name: [name] Procedure Date: 10/10/2023 11:32 AM MRN: [id number] Date of Birth: 05-28-1946 Attending MD: Rolando Cliche. Mordechai April , [place], [id number] CSN: [id number] Age: 78 Admit Type: Outpatient Procedure:                Colonoscopy Indications:              High risk colon cancer surveillance: Personal                            history of colonic polyps Providers:                Rolando Cliche. Mordechai April, DO, Vonna Guardian, Theola Fitch Referring MD:              Medicines:                See the Anesthesia note for documentation of the                            administered medications Complications:            No immediate complications. Estimated Blood Loss:     Estimated blood loss was minimal. Procedure:                Pre-Anesthesia Assessment:                           - The anesthesia plan was to use monitored                            anesthesia care (MAC).                           After obtaining informed consent, the colonoscope                            was passed under direct vision. Throughout the                            procedure, the patient's blood pressure, pulse, and                            oxygen  saturations were monitored continuously. The                            [id number]-HQ190L ([id number]) scope was introduced through                            the anus and advanced to the the cecum, identified                            by appendiceal orifice and ileocecal valve. The                            colonoscopy was performed without difficulty. The                            patient tolerated  the procedure well. The quality                            of the bowel preparation was evaluated using the                            BBPS [facility] Bowel Preparation Scale) with scores                            of: Right Colon = 2 (minor amount of residual                            staining, small fragments of stool and/or opaque                             liquid, but mucosa seen well), Transverse Colon = 2                            (minor amount of residual staining, small fragments                            of stool and/or opaque liquid, but mucosa seen                            well) and Left Colon = 2 (minor amount of residual                            staining, small fragments of stool and/or opaque                            liquid, but mucosa seen well). The total BBPS score                            equals 6. The quality of the bowel preparation was                            good. Scope In: 12:19:20 PM Scope Out: 12:37:47 PM Scope Withdrawal Time: 0 hours 15 minutes 22 seconds  Total Procedure Duration: 0 hours 18 minutes 27 seconds  Findings:      Non-bleeding internal hemorrhoids were found.      Many large-mouthed and small-mouthed diverticula were found in the       sigmoid colon, descending colon and transverse colon.      Three sessile polyps were found in the descending colon and transverse       colon. The polyps were 4 to 6 mm in size. These polyps were removed with       a cold snare. Resection and retrieval were complete.      The exam was otherwise without abnormality. Impression:               - Non-bleeding internal hemorrhoids.                           - Diverticulosis in the sigmoid colon,  in the                            descending colon and in the transverse colon.                           - Three 4 to 6 mm polyps in the descending colon                            and in the transverse colon, removed with a cold                            snare. Resected and retrieved.                           - The examination was otherwise normal. Moderate Sedation:      Per Anesthesia Care Recommendation:           - Patient has a contact number available for                            emergencies. The signs and symptoms of potential                            delayed complications were discussed with the                             patient. Return to normal activities tomorrow.                            Written discharge instructions were provided to the                            patient.                           - Resume previous diet.                           - Continue present medications.                           - Await pathology results.                           - Repeat colonoscopy in 5 years for surveillance.                           - Return to GI clinic in 3 months. Procedure Code(s):        --- Professional ---                           [phone], Colonoscopy, flexible; with removal of                            tumor(s), polyp(s), or other lesion(s) by snare  technique Diagnosis Code(s):        --- Professional ---                           Z86.010, Personal history of colonic polyps                           K64.8, Other hemorrhoids                           D12.4, Benign neoplasm of descending colon                           D12.3, Benign neoplasm of transverse colon (hepatic                            flexure or splenic flexure)                           K57.30, Diverticulosis of large intestine without                            perforation or abscess without bleeding CPT copyright 2022 American Medical Association. All rights reserved. The codes documented in this report are preliminary and upon coder review may  be revised to meet current compliance requirements. Rolando Cliche. Mordechai April, DO Rolando Cliche. Mordechai April, DO 10/10/2023 12:40:41 PM This report has been signed electronically. Number of Addenda: 0

## 2023-10-10 NOTE — Discharge Instructions (Addendum)
  Colonoscopy Discharge Instructions  Read the instructions outlined below and refer to this sheet in the next few weeks. These discharge instructions provide you with general information on caring for yourself after you leave the hospital. Your doctor may also give you specific instructions. While your treatment has been planned according to the most current medical practices available, unavoidable complications occasionally occur.   ACTIVITY You may resume your regular activity, but move at a slower pace for the next 24 hours.  Take frequent rest periods for the next 24 hours.  Walking will help get rid of the air and reduce the bloated feeling in your belly (abdomen).  No driving for 24 hours (because of the medicine (anesthesia) used during the test).   Do not sign any important legal documents or operate any machinery for 24 hours (because of the anesthesia used during the test).  NUTRITION Drink plenty of fluids.  You may resume your normal diet as instructed by your doctor.  Begin with a light meal and progress to your normal diet. Heavy or fried foods are harder to digest and may make you feel sick to your stomach (nauseated).  Avoid alcoholic beverages for 24 hours or as instructed.  MEDICATIONS You may resume your normal medications unless your doctor tells you otherwise.  WHAT YOU CAN EXPECT TODAY Some feelings of bloating in the abdomen.  Passage of more gas than usual.  Spotting of blood in your stool or on the toilet paper.  IF YOU HAD POLYPS REMOVED DURING THE COLONOSCOPY: No aspirin products for 7 days or as instructed.  No alcohol for 7 days or as instructed.  Eat a soft diet for the next 24 hours.  FINDING OUT THE RESULTS OF YOUR TEST Not all test results are available during your visit. If your test results are not back during the visit, make an appointment with your caregiver to find out the results. Do not assume everything is normal if you have not heard from your  caregiver or the medical facility. It is important for you to follow up on all of your test results.  SEEK IMMEDIATE MEDICAL ATTENTION IF: You have more than a spotting of blood in your stool.  Your belly is swollen (abdominal distention).  You are nauseated or vomiting.  You have a temperature over 101.  You have abdominal pain or discomfort that is severe or gets worse throughout the day.   Your colonoscopy revealed 3 polyp(s) which I removed successfully. Await pathology results, my office will contact you. I recommend repeating colonoscopy in 5 years for surveillance purposes.   You also have diverticulosis and internal hemorrhoids. I would recommend increasing fiber in your diet or adding OTC Benefiber/Metamucil. Be sure to drink at least 4 to 6 glasses of water daily. Follow-up with GI in 3-4 months   I hope you have a great rest of your week!  [name], D.O. Gastroenterology and Hepatology [facility]

## 2023-10-10 NOTE — Anesthesia Preprocedure Evaluation (Signed)
 Anesthesia Evaluation  Patient identified by MRN, date of birth, ID band Patient awake    Reviewed: Allergy & Precautions, H&P , NPO status , Patient's Chart, lab work & pertinent test results  Airway Mallampati: II  TM Distance: >3 FB Neck ROM: Full    Dental no notable dental hx.    Pulmonary neg pulmonary ROS   Pulmonary exam normal breath sounds clear to auscultation       Cardiovascular hypertension, + Peripheral Vascular Disease  Normal cardiovascular exam+ dysrhythmias  Rhythm:Regular Rate:Normal     Neuro/Psych  PSYCHIATRIC DISORDERS  Depression     Neuromuscular disease    GI/Hepatic Neg liver ROS, PUD,GERD  Controlled,,  Endo/Other  diabetes  Class 3 obesity  Renal/GU negative Renal ROS  negative genitourinary   Musculoskeletal  (+) Arthritis ,    Abdominal   Peds negative pediatric ROS (+)  Hematology negative hematology ROS (+)   Anesthesia Other Findings   Reproductive/Obstetrics negative OB ROS                             Anesthesia Physical Anesthesia Plan  ASA: 3  Anesthesia Plan: General   Post-op Pain Management:    Induction: Intravenous  PONV Risk Score and Plan: Ondansetron   Airway Management Planned: Simple Face Mask  Additional Equipment:   Intra-op Plan:   Post-operative Plan:   Informed Consent: I have reviewed the patients History and Physical, chart, labs and discussed the procedure including the risks, benefits and alternatives for the proposed anesthesia with the patient or authorized representative who has indicated his/her understanding and acceptance.       Plan Discussed with: CRNA  Anesthesia Plan Comments:        Anesthesia Quick Evaluation

## 2023-10-10 NOTE — Interval H&P Note (Signed)
 History and Physical Interval Note:  10/10/2023 11:29 AM  [name]  has presented today for surgery, with the diagnosis of history of polyps.  The various methods of treatment have been discussed with the patient and family. After consideration of risks, benefits and other options for treatment, the patient has consented to  Procedure(s) with comments: COLONOSCOPY WITH PROPOFOL  (N/A) - 1:00 pm, asa 3 as a surgical intervention.  The patient's history has been reviewed, patient examined, no change in status, stable for surgery.  I have reviewed the patient's chart and labs.  Questions were answered to the patient's satisfaction.     [name]

## 2023-10-10 NOTE — Transfer of Care (Signed)
 Immediate Anesthesia Transfer of Care Note  Patient: [name]  Procedure(s) Performed: COLONOSCOPY WITH PROPOFOL  POLYPECTOMY  Patient Location: Short Stay  Anesthesia Type:General  Level of Consciousness: awake, alert , oriented, and patient cooperative  Airway & Oxygen  Therapy: Patient Spontanous Breathing  Post-op Assessment: Report given to RN, Post -op Vital signs reviewed and stable, and Patient moving all extremities X 4  Post vital signs: Reviewed and stable  Last Vitals:  Vitals Value Taken Time  BP 131/54 10/10/23 1244  Temp 36.5 C 10/10/23 1244  Pulse 96 10/10/23 1244  Resp 26 10/10/23 1244  SpO2 100 % 10/10/23 1244    Last Pain:  Vitals:   10/10/23 1244  TempSrc: Oral  PainSc: 0-No pain         Complications: No notable events documented.

## 2023-10-11 ENCOUNTER — Encounter (HOSPITAL_COMMUNITY): Payer: Self-pay | Admitting: Internal Medicine

## 2023-10-11 LAB — SURGICAL PATHOLOGY

## 2023-10-24 ENCOUNTER — Telehealth: Payer: Self-pay

## 2023-10-24 NOTE — Telephone Encounter (Signed)
 Patient dropped off document DMV, to be filled out by provider. Patient requested to send it back via Call Patient to pick up within 7-days. Document is located in providers tray at front office.Please advise at Mobile [phone] (mobile)

## 2023-10-26 NOTE — Telephone Encounter (Signed)
This was completed thank you 

## 2023-11-17 ENCOUNTER — Ambulatory Visit: Payer: Self-pay

## 2023-11-17 NOTE — Telephone Encounter (Signed)
 Copied from CRM [id number]. Topic: Clinical - Red Word Triage >> Nov 17, 2023  1:27 PM Elle L wrote: Red Word that prompted transfer to Nurse Triage: The patient states that her arm is swelling up to her shoulder and has a knot in it and it is painful. She has had a blood clot there before.   Chief Complaint: Upper right arm swelling Symptoms: pain, swelling Frequency: a couple of days Pertinent Negatives: Patient denies injury, fever, chest pain, difficulty breathing. Disposition: [] ED /[] Urgent Care (no appt availability in office) / [] Appointment(In office/virtual)/ []  [place] [facility]/ [] Home Care/ [x] Refused Recommended Disposition /[] [place] [facility]/ []  Follow-up with PCP Additional Notes: Patient called and advised Pt states a knot above her elbow that is moveable. Patient denies any injuries, fever, chest pain, or difficulty breathing. Patient states it hurts when she lifts her arm. Patient states that she has had a blood clot in this arm in the past. Patient on blood thinners. Patient states that this area is painful a 6 out of 10. Patient is advised that with severe swelling and pain, along with a history of a blood clot in this arm--it is recommended at this time that she goes to the Emergency Room for further evaluation.  Patient states that she was just calling to make an appointment with her PCP Dr Lilyan Punt.  She states that if she goes to the ER she will have to wait a long time. At this time, this RN called the CAL for the patient's PCP office Patient is upset with not being able to set up a office visit at her PCP office. CAL is advised of this triage RN's finding and recommendation of E.D. disposition at this time. Patient is transferred over to the PCP office line at this time to speak with them.     Reason for Disposition  SEVERE arm swelling (e.g., all of arm looks swollen)  Answer Assessment - Initial Assessment Questions 1. ONSET: "When did the  swelling start?" (e.g., minutes, hours, days)     A couple of days 2. LOCATION: "What part of the arm is swollen?"  "Are both arms swollen or just one arm?"     Right arm--from the elbow up 3. SEVERITY: "How bad is the swelling?" (e.g., localized; mild, moderate, severe)   - LOCALIZED: Small area of puffiness or swelling on just one arm   - JOINT SWELLING: Swelling of one joint   - MILD: Puffiness or swelling of hand   - MODERATE: Puffiness or swollen feeling of entire arm    - SEVERE: All of arm looks swollen; pitting edema     severe 4. REDNESS: "Does the swelling look red or infected?"     No 5. PAIN: "Is the swelling painful to touch?" If Yes, ask: "How painful is it?"   (Scale 1-10; mild, moderate or severe)     6 6. FEVER: "Do you have a fever?" If Yes, ask: "What is it, how was it measured, and when did it start?"      No 7. CAUSE: "What do you think is causing the arm swelling?"     unsure 8. MEDICAL HISTORY: "Do you have a history of heart failure, kidney disease, liver failure, or cancer?"     "Not to my knowledge" 9. RECURRENT SYMPTOM: "Have you had arm swelling before?" If Yes, ask: "When was the last time?" "What happened that time?"     Hx of blood clot 10. OTHER SYMPTOMS: "Do  you have any other symptoms?" (e.g., chest pain, difficulty breathing)       No  Protocols used: Arm Swelling and Edema-A-AH

## 2023-11-17 NOTE — Telephone Encounter (Signed)
 Patient offered appointment today at PCP office and declined stating she had another appointment today and scheduled office visit tomorrow am with [facility]

## 2023-11-18 ENCOUNTER — Ambulatory Visit (INDEPENDENT_AMBULATORY_CARE_PROVIDER_SITE_OTHER): Admitting: Physician Assistant

## 2023-11-18 ENCOUNTER — Encounter: Payer: Self-pay | Admitting: Physician Assistant

## 2023-11-18 ENCOUNTER — Ambulatory Visit (HOSPITAL_COMMUNITY)
Admission: RE | Admit: 2023-11-18 | Discharge: 2023-11-18 | Disposition: A | Discharge status: Home / Self Care | Source: Ambulatory Visit | Attending: Physician Assistant | Admitting: Physician Assistant

## 2023-11-18 VITALS — BP 138/58 | HR 62 | Temp 97.4°F | Ht 64.0 in | Wt 246.0 lb

## 2023-11-18 DIAGNOSIS — R2231 Localized swelling, mass and lump, right upper limb: Secondary | ICD-10-CM | POA: Diagnosis not present

## 2023-11-18 DIAGNOSIS — R0989 Other specified symptoms and signs involving the circulatory and respiratory systems: Secondary | ICD-10-CM | POA: Insufficient documentation

## 2023-11-18 DIAGNOSIS — M7989 Other specified soft tissue disorders: Secondary | ICD-10-CM | POA: Diagnosis not present

## 2023-11-18 NOTE — Assessment & Plan Note (Addendum)
 Patient with new onset right upper arm pain.  Due to history of clot and new onset of symptoms in addition to pain and swelling will rule out DVT with ultrasound today.  Discussed with patient and supervising physician the difference between superficial versus deep clots today and information provided on both.  Patient voices understanding. Venous duplex US scheduled for today.  Normal duplex US, no signs of DVT or superficial thrombophlebitis. Incidental 1.4cm cyst like lesion appreciated on right posterior arm. Symptoms likely musculoskeletal in nature. Patient advised tylenol as needed for pain. She has follow up previously scheduled with Dr. Lorin Picket in April. She should return for worsening symptoms.

## 2023-11-18 NOTE — Progress Notes (Signed)
 Acute Office Visit  Subjective:     Patient ID: [name], female    DOB: [birth year], 78 y.o.   MRN: [id number]   Patient presents today with new onset of right arm pain that began in the last 2 to 3 days.  She reports sitting in her chair at home and she continued on her arm began bothering her.  She reports right arm pain and swelling.  She denies fevers, redness, or warmth around the area of concern.  Patient reports distant history of clot in the same arm.  She is unsure if this was a deep or superficial clot.  Patient is currently on Plavix.     Review of Systems  Constitutional:  Negative for fever and malaise/fatigue.  Respiratory:  Negative for shortness of breath.   Cardiovascular:  Negative for chest pain.  Musculoskeletal:  Negative for falls.       Right arm pain         Objective:     BP (!) 138/58   Pulse 62   Temp (!) 97.4 F (36.3 C)   Ht 5\' 4"  (1.626 m)   Wt 246 lb (111.6 kg)   SpO2 98%   BMI 42.23 kg/m   Physical Exam Constitutional:      Appearance: Normal appearance. She is obese.  HENT:     Head: Normocephalic.     Mouth/Throat:     Mouth: Mucous membranes are moist.     Pharynx: Oropharynx is clear.  Eyes:     Extraocular Movements: Extraocular movements intact.     Conjunctiva/sclera: Conjunctivae normal.  Cardiovascular:     Rate and Rhythm: Normal rate and regular rhythm.     Heart sounds: Normal heart sounds. No murmur heard. Pulmonary:     Effort: Pulmonary effort is normal.     Breath sounds: Normal breath sounds.  Musculoskeletal:     Right upper arm: Swelling and tenderness present. No bony tenderness.     Left upper arm: Normal. No swelling.     Comments: Medial swelling and tenderness to right upper arm. No surrounding erythema or warmth.  Skin:    General: Skin is warm and dry.  Neurological:     General: No focal deficit present.     Mental Status: She is alert and oriented to person, place, and time.   Psychiatric:        Mood and Affect: Mood normal.        Behavior: Behavior normal.     No results found for any visits on 11/18/23.      Assessment & Plan:  Suspected venous thromboembolism Assessment & Plan: Patient with new onset right upper arm pain.  Due to history of clot and new onset of symptoms in addition to pain and swelling will rule out DVT with ultrasound today.  Discussed with patient and supervising physician the difference between superficial versus deep clots today and information provided on both.  Patient voices understanding. Venous duplex US scheduled for today.  Normal duplex US, no signs of DVT or superficial thrombophlebitis. Incidental 1.4cm cyst like lesion appreciated on right posterior arm. Symptoms likely musculoskeletal in nature. Patient advised tylenol as needed for pain. She has follow up previously scheduled with Dr. Lorin Picket in April. She should return for worsening symptoms.    Orders: -     US Venous Img Upper Uni Right(DVT)    Return for previously scheduled appointment or if symptoms worsen or fail to improve.  Toni Amend  Jaydn Moscato, PA-C

## 2023-11-30 ENCOUNTER — Encounter: Payer: Self-pay | Admitting: Gastroenterology

## 2023-11-30 ENCOUNTER — Other Ambulatory Visit: Payer: Self-pay | Admitting: Family Medicine

## 2023-12-02 ENCOUNTER — Encounter: Payer: Self-pay | Admitting: Family Medicine

## 2023-12-02 ENCOUNTER — Ambulatory Visit (INDEPENDENT_AMBULATORY_CARE_PROVIDER_SITE_OTHER): Payer: Medicare HMO | Admitting: Family Medicine

## 2023-12-02 VITALS — BP 132/76 | HR 47 | Temp 97.7°F | Ht 64.0 in | Wt 247.0 lb

## 2023-12-02 DIAGNOSIS — E1140 Type 2 diabetes mellitus with diabetic neuropathy, unspecified: Secondary | ICD-10-CM | POA: Diagnosis not present

## 2023-12-02 DIAGNOSIS — F439 Reaction to severe stress, unspecified: Secondary | ICD-10-CM

## 2023-12-02 DIAGNOSIS — I10 Essential (primary) hypertension: Secondary | ICD-10-CM

## 2023-12-02 DIAGNOSIS — R600 Localized edema: Secondary | ICD-10-CM | POA: Diagnosis not present

## 2023-12-02 DIAGNOSIS — R252 Cramp and spasm: Secondary | ICD-10-CM | POA: Diagnosis not present

## 2023-12-02 DIAGNOSIS — K7689 Other specified diseases of liver: Secondary | ICD-10-CM | POA: Diagnosis not present

## 2023-12-02 DIAGNOSIS — Z79899 Other long term (current) drug therapy: Secondary | ICD-10-CM

## 2023-12-02 DIAGNOSIS — E1169 Type 2 diabetes mellitus with other specified complication: Secondary | ICD-10-CM | POA: Diagnosis not present

## 2023-12-02 DIAGNOSIS — E785 Hyperlipidemia, unspecified: Secondary | ICD-10-CM

## 2023-12-02 MED ORDER — PANTOPRAZOLE SODIUM 40 MG PO TBEC: 40.0000 mg | DELAYED_RELEASE_TABLET | Freq: Every day | ORAL | 3 refills | Status: AC

## 2023-12-02 MED ORDER — PRAVASTATIN SODIUM 20 MG PO TABS: ORAL_TABLET | ORAL | 3 refills | Status: DC

## 2023-12-02 MED ORDER — SITAGLIPTIN PHOSPHATE 50 MG PO TABS: 50.0000 mg | ORAL_TABLET | Freq: Every day | ORAL | 3 refills | Status: AC

## 2023-12-02 MED ORDER — LOSARTAN POTASSIUM 25 MG PO TABS: 25.0000 mg | ORAL_TABLET | Freq: Every day | ORAL | 3 refills | Status: DC

## 2023-12-02 NOTE — Progress Notes (Signed)
 Subjective:    Patient ID: [name], female    DOB: 1946/06/22, 78 y.o.   MRN: [id number]  HPI  6 month follow up for diabetes and htn   C/o nervousness and discomfort in middle of her abdomen  Recently seen for r arm cyst  C/o headaches back of head, l ear sharp pains intermittent   Foot and leg cramps Discussed the use of AI scribe software for clinical note transcription with the patient, who gave verbal consent to proceed.  History of Present Illness   [name] is a 78 year old female who presents with stress and anxiety related to caregiving responsibilities.  She experiences significant stress and anxiety, particularly related to her caregiving responsibilities for her son who is on dialysis and a friend undergoing breast cancer treatment. She describes herself as a 'nervous, nervous, nervous person'. She copes by sitting in the parking lot,or watching movies. Her caregiving responsibilities are extensive, as her son undergoes dialysis on Mondays, Wednesdays, and Fridays, and visits the doctor's office on Tuesdays and Thursdays, leaving only Saturdays and Sundays free. On dialysis days, her son is often very tired and sleeps upon returning home, which adds to her stress. She also feels anxious when her friend drives fast.  Her appetite has been affected, often skipping breakfast and eating minimally due to stress and seeing blood from her son's dialysis-related surgery. She has attempted fasting to lose weight but feels she is gaining instead. She is aware of the importance of including protein in her diet but struggles with maintaining a balanced diet due to her busy schedule.  She experiences muscle cramps, particularly in her legs and toes, and sometimes in her arms. These cramps occur both during activity and at rest, including at night. She engages in walking but has not been doing specific stretches for the cramps.  She reports having headaches,  sometimes sharp pains in her ear, and occasional pain at the back of her head. She experiences some neck stiffness but does not have significant issues with neck movement. Her sleep is disrupted, often waking at 3 AM and not returning to sleep, which she attributes to her caregiving duties and her son's restlessness at night.  She is currently taking several medications, including Plavix, Linzess, and medications for blood pressure, cholesterol, and diabetes. She is diligent with her medication regimen and has recently had refills called in. She also mentions having had a colonoscopy that showed a polyp, with a follow-up scheduled in five years.      Review of Systems     Objective:   Physical Exam  General-in no acute distress Eyes-no discharge Lungs-respiratory rate normal, CTA CV-no murmurs,RRR Extremities skin warm dry no edema Neuro grossly normal Behavior normal, alert Right biceps region is 16 inches left side 14 inches Recent ultrasound did not show blood clot Axilla ultrasound from November did not show any lymphadenopathy Will monitor for now      Assessment & Plan:  1. Essential hypertension HTN- patient seen for follow-up regarding HTN.   Diet, medication compliance, appropriate labs and refills were completed.   Importance of keeping blood pressure under good control to lessen the risk of complications discussed Regular follow-up visits discussed  - losartan (COZAAR) 25 MG tablet; Take 1 tablet (25 mg total) by mouth daily.  Dispense: 90 tablet; Refill: 3 - Basic Metabolic Panel - Microalbumin/Creatinine Ratio, Urine  2. Type 2 diabetes mellitus with diabetic neuropathy, without long-term current use of insulin (  HCC) (Primary) The patient was seen today as part of a comprehensive visit for diabetes. The importance of keeping her A1c at or below 7 range was discussed.  Discussed diet, activity, and medication compliance Emphasized healthy eating primarily with  vegetables fruits and if utilizing meats lean meats such as chicken or fish grilled baked broiled Avoid sugary drinks Minimize and avoid processed foods Fit in regular physical activity preferably 25 to 30 minutes 4 times per week Standard follow-up visit recommended.  Patient aware lack of control and follow-up increases risk of diabetic complications. Regular follow-up visits Yearly ophthalmology Yearly foot exam  - Basic Metabolic Panel - Hemoglobin A1c - Microalbumin/Creatinine Ratio, Urine  3. Liver cyst Will check ultrasound in May this is 6 months from previous - US Abdomen Limited RUQ (LIVER/GB)  4. Hyperlipidemia associated with type 2 diabetes mellitus (HCC) Check labs before next visit goal is to have LDL below 70 continue current meds - Lipid Panel  5. Morbid obesity (HCC) Portion control regular physical activity May or may not be a good candidate for GLP-1's depending on what the A1c shows - Sedimentation Rate - Basic Metabolic Panel  6. Muscle cramp Stretches shown check electrolytes - Sedimentation Rate - C-reactive protein  7. Edema of right upper arm No sign of blood clot  8. High risk medication use Labs ordered - Hepatic Function Panel  9. Stress Counseling recommended - Ambulatory referral to Psychiatry  This follow-up in June

## 2023-12-05 ENCOUNTER — Other Ambulatory Visit: Payer: Self-pay

## 2023-12-05 DIAGNOSIS — F439 Reaction to severe stress, unspecified: Secondary | ICD-10-CM

## 2023-12-13 ENCOUNTER — Other Ambulatory Visit: Payer: Self-pay | Admitting: *Deleted

## 2023-12-13 ENCOUNTER — Telehealth: Payer: Self-pay | Admitting: *Deleted

## 2023-12-13 DIAGNOSIS — E1169 Type 2 diabetes mellitus with other specified complication: Secondary | ICD-10-CM | POA: Diagnosis not present

## 2023-12-13 DIAGNOSIS — R252 Cramp and spasm: Secondary | ICD-10-CM | POA: Diagnosis not present

## 2023-12-13 DIAGNOSIS — E1140 Type 2 diabetes mellitus with diabetic neuropathy, unspecified: Secondary | ICD-10-CM | POA: Diagnosis not present

## 2023-12-13 DIAGNOSIS — Z79899 Other long term (current) drug therapy: Secondary | ICD-10-CM | POA: Diagnosis not present

## 2023-12-13 DIAGNOSIS — K7689 Other specified diseases of liver: Secondary | ICD-10-CM

## 2023-12-13 DIAGNOSIS — E785 Hyperlipidemia, unspecified: Secondary | ICD-10-CM | POA: Diagnosis not present

## 2023-12-13 DIAGNOSIS — I10 Essential (primary) hypertension: Secondary | ICD-10-CM | POA: Diagnosis not present

## 2023-12-13 NOTE — Telephone Encounter (Signed)
 Pt left vm that she received a letter saying to call to schedule US .  Left vm on home and mobile that RUQ US  has been scheduled for Thursday 12/22/23, arrive at 10:15 am at [facility] to check in, NPO after midnight. Any questions to call office back

## 2023-12-15 ENCOUNTER — Encounter: Payer: Self-pay | Admitting: Family Medicine

## 2023-12-15 LAB — HEMOGLOBIN A1C
Est. average glucose Bld gHb Est-mCnc: 157 mg/dL
Hgb A1c MFr Bld: 7.1 % — ABNORMAL HIGH (ref 4.8–5.6)

## 2023-12-15 LAB — HEPATIC FUNCTION PANEL
ALT: 15 IU/L (ref 0–32)
AST: 20 IU/L (ref 0–40)
Albumin: 4.5 g/dL (ref 3.8–4.8)
Alkaline Phosphatase: 97 IU/L (ref 44–121)
Bilirubin Total: 0.3 mg/dL (ref 0.0–1.2)
Bilirubin, Direct: 0.13 mg/dL (ref 0.00–0.40)
Total Protein: 7.4 g/dL (ref 6.0–8.5)

## 2023-12-15 LAB — BASIC METABOLIC PANEL WITH GFR
BUN/Creatinine Ratio: 16 (ref 12–28)
BUN: 16 mg/dL (ref 8–27)
CO2: 24 mmol/L (ref 20–29)
Calcium: 10.1 mg/dL (ref 8.7–10.3)
Chloride: 104 mmol/L (ref 96–106)
Creatinine, Ser: 0.99 mg/dL (ref 0.57–1.00)
Glucose: 112 mg/dL — ABNORMAL HIGH (ref 70–99)
Potassium: 5 mmol/L (ref 3.5–5.2)
Sodium: 145 mmol/L — ABNORMAL HIGH (ref 134–144)
eGFR: 59 mL/min/{1.73_m2} — ABNORMAL LOW (ref >59)

## 2023-12-15 LAB — LIPID PANEL
Chol/HDL Ratio: 2.8 ratio (ref 0.0–4.4)
Cholesterol, Total: 146 mg/dL (ref 100–199)
HDL: 53 mg/dL (ref >39)
LDL Chol Calc (NIH): 75 mg/dL (ref 0–99)
Triglycerides: 97 mg/dL (ref 0–149)
VLDL Cholesterol Cal: 18 mg/dL (ref 5–40)

## 2023-12-15 LAB — MICROALBUMIN / CREATININE URINE RATIO
Creatinine, Urine: 136.8 mg/dL
Microalb/Creat Ratio: 10 mg/g{creat} (ref 0–29)
Microalbumin, Urine: 13 ug/mL

## 2023-12-15 LAB — SEDIMENTATION RATE: Sed Rate: 10 mm/h (ref 0–40)

## 2023-12-15 LAB — C-REACTIVE PROTEIN: CRP: 4 mg/L (ref 0–10)

## 2023-12-15 NOTE — Progress Notes (Signed)
Please mail to patient thank you ?

## 2023-12-22 ENCOUNTER — Other Ambulatory Visit: Payer: Self-pay | Admitting: Family Medicine

## 2023-12-22 ENCOUNTER — Ambulatory Visit (HOSPITAL_COMMUNITY)
Admission: RE | Admit: 2023-12-22 | Discharge: 2023-12-22 | Disposition: A | Discharge status: Home / Self Care | Source: Ambulatory Visit | Attending: Gastroenterology | Admitting: Gastroenterology

## 2023-12-22 DIAGNOSIS — Q446 Cystic disease of liver: Secondary | ICD-10-CM | POA: Diagnosis not present

## 2023-12-22 DIAGNOSIS — K7689 Other specified diseases of liver: Secondary | ICD-10-CM | POA: Diagnosis not present

## 2023-12-22 DIAGNOSIS — K760 Fatty (change of) liver, not elsewhere classified: Secondary | ICD-10-CM | POA: Diagnosis not present

## 2023-12-22 DIAGNOSIS — R160 Hepatomegaly, not elsewhere classified: Secondary | ICD-10-CM | POA: Diagnosis not present

## 2023-12-22 NOTE — Telephone Encounter (Signed)
 Copied from CRM [id number]. Topic: Clinical - Medication Refill >> Dec 22, 2023  3:25 PM Ivette P wrote: Most Recent Primary Care Visit:  Provider: Charlotta Cook A  Department: [facility]  Visit Type: OFFICE VISIT  Date: 12/02/2023  Medication: clopidogrel  (PLAVIX ) 75 MG tablet  Has the patient contacted their pharmacy? Yes (Agent: If no, request that the patient contact the pharmacy for the refill. If patient does not wish to contact the pharmacy document the reason why and proceed with request.) (Agent: If yes, when and what did the pharmacy advise?)  Is this the correct pharmacy for this prescription? Yes If no, delete pharmacy and type the correct one.  This is the patient's preferred pharmacy:  [facility] - [place], [place] - [address] [place] [address] Phone: [phone] Fax: [phone]   Has the prescription been filled recently? No, 09/26/2023  Is the patient out of the medication? Yes, very few left   Has the patient been seen for an appointment in the last year OR does the patient have an upcoming appointment? Yes, 02/22/24- 9AM  Can we respond through MyChart? No  Agent: Please be advised that Rx refills may take up to 3 business days. We ask that you follow-up with your pharmacy.

## 2023-12-26 ENCOUNTER — Encounter: Payer: Self-pay | Admitting: *Deleted

## 2023-12-27 ENCOUNTER — Ambulatory Visit: Payer: Medicare HMO | Attending: Cardiology | Admitting: Cardiology

## 2023-12-27 ENCOUNTER — Encounter: Payer: Self-pay | Admitting: Cardiology

## 2023-12-27 VITALS — BP 134/78 | HR 54 | Ht 64.0 in | Wt 250.0 lb

## 2023-12-27 DIAGNOSIS — E782 Mixed hyperlipidemia: Secondary | ICD-10-CM

## 2023-12-27 DIAGNOSIS — Z87898 Personal history of other specified conditions: Secondary | ICD-10-CM

## 2023-12-27 DIAGNOSIS — I10 Essential (primary) hypertension: Secondary | ICD-10-CM | POA: Diagnosis not present

## 2023-12-27 DIAGNOSIS — I2510 Atherosclerotic heart disease of native coronary artery without angina pectoris: Secondary | ICD-10-CM | POA: Diagnosis not present

## 2023-12-27 NOTE — Progress Notes (Signed)
 Clinical Summary Ms. Penington is a 78 y.o.female seen today for follow up of the following medical problems.   1. Chest pain  - long history of chest pain - caths in 2005 and 2012 with just mild disease.  - nuclear stress 07/2016 without ischemia   she reports asked not to take aspirin  in the past by GI, has been on plavix  as alternative given mild CAD by prior cath and multiple risk factors        - no recent chest pains.       2. Hyperlipidemia - stomach troubles on lipitor, tolerating pravastatin .    - 10/2019 TC 119 TG 89 HDL 47 LDL 55 - 11/2023 TC 161 TG 97 HDL 53 LDL 75   3. HTN - compliant with meds  4. Bradycardia - 07/2023 monitor: occasional PACs, 6 runs SVT longest 16 beats, frequent PVCs 5.1% burden. Isolated 3 sec pause early AM hours. Avg HR 63 - no lightheadedness or dizziness.    5. Liver cysts - followed by GI  6. LE edema - reports some ankle swelling, reports some abdominal distension - denies SOB/DOE.  - recent ankle surgery   SH: oldest son has CKD IV, pending dialysis. 84 yo sister she helps take care of.  Past Medical History:  Diagnosis Date   Blood clot of artery under arm (HCC) 2017   Bronchitis    Chest pain    a. mild CAD by cath in 2005 and 2012 b. low-risk NST's in 07/2016 and 05/2019   Cyst on liver   Depression    Diabetes mellitus    controlled by diet   Diverticulitis    DVT of axillary vein, acute right (HCC) 11/24/2012   Dx on Jan 28, 2012.     Dysrhythmia    Fibrocystic breast disease    Hepatic cyst    Hypertension    IBS (irritable bowel syndrome)    Impaired glucose tolerance    Menopause    Tachycardia      Allergies  Allergen Reactions   Atorvastatin     GI Issues   Ibuprofen      Do not take since she is on antiplatelet   Norvasc [Amlodipine Besylate] Other (See Comments)    Reaction:  Fatigue    Penicillins Hives and Other (See Comments)    Has patient had a PCN reaction causing immediate rash,  facial/tongue/throat swelling, SOB or lightheadedness with hypotension: Yes Has patient had a PCN reaction causing severe rash involving mucus membranes or skin necrosis: No Has patient had a PCN reaction that required hospitalization No Has patient had a PCN reaction occurring within the last 10 years: No If all of the above answers are "NO", then may proceed with Cephalosporin use.    Advair Diskus [Fluticasone-Salmeterol] Palpitations   Metformin  And Related Other (See Comments)    Reaction:  GI upset      Current Outpatient Medications  Medication Sig Dispense Refill   acetaminophen  (TYLENOL ) 500 MG tablet Take 1,000 mg by mouth every 6 (six) hours as needed for mild pain, moderate pain or headache.      Blood Glucose Monitoring Suppl (PRODIGY AUTOCODE BLOOD GLUCOSE) w/Device KIT USE AS DIRECTED 1 kit 0   Cholecalciferol (VITAMIN D-3) 125 MCG (5000 UT) TABS Take 5,000 Units by mouth daily.     clopidogrel  (PLAVIX ) 75 MG tablet TAKE 1 TABLET EVERY DAY (NEED MD APPOINTMENT FOR REFILLS) 90 tablet 3   diclofenac  Sodium (VOLTAREN )  1 % GEL Apply 2 g topically 4 (four) times daily. Rub into affected area of foot 2 to 4 times daily 100 g 2   latanoprost (XALATAN) 0.005 % ophthalmic solution Place 1 drop into both eyes at bedtime.     linaclotide  (LINZESS ) 290 MCG CAPS capsule Take 1 capsule (290 mcg total) by mouth daily before breakfast. 90 capsule 3   losartan  (COZAAR ) 25 MG tablet Take 1 tablet (25 mg total) by mouth daily. 90 tablet 3   pantoprazole  (PROTONIX ) 40 MG tablet Take 1 tablet (40 mg total) by mouth daily. 90 tablet 3   pravastatin  (PRAVACHOL ) 20 MG tablet TAKE 1 TABLET EVERY EVENING ( DOSE DECREASE ) 90 tablet 3   PRODIGY NO CODING BLOOD GLUC test strip TEST BLOOD SUGAR AS INSTRUCTED 100 strip 3   sitaGLIPtin  (JANUVIA ) 50 MG tablet Take 1 tablet (50 mg total) by mouth daily. 90 tablet 3   No current facility-administered medications for this visit.     Past Surgical History:   Procedure Laterality Date   ABDOMINAL HYSTERECTOMY     BACK SURGERY     blood clot Right 2014   arm   BREAST SURGERY  right   cyst removed   CARDIAC CATHETERIZATION  2010   with stent   CAST APPLICATION  02/14/2012   Procedure: CAST APPLICATION;  Surgeon: Darrin Emerald, MD;  Location: [facility];  Service: Orthopedics;  Laterality: Right;  procedure room    CHOLECYSTECTOMY     COLONOSCOPY  12/04/2004   NUR:Few small diverticula at sigmoid colon/Small cecal polyp ablated    COLONOSCOPY N/A 09/24/2014   SLF: small internal hemorrhoids   COLONOSCOPY N/A 02/15/2020   Procedure: COLONOSCOPY;  Surgeon: Suzette Espy, MD;  Location: [facility];  Service: Endoscopy;  Laterality: N/A;  8:30am   COLONOSCOPY WITH PROPOFOL  N/A 01/17/2023   Procedure: COLONOSCOPY WITH PROPOFOL ;  Surgeon: Vinetta Greening, DO;  Location: [facility];  Service: Endoscopy;  Laterality: N/A;  1:00 pm, ASA 3   COLONOSCOPY WITH PROPOFOL  N/A 10/10/2023   Procedure: COLONOSCOPY WITH PROPOFOL ;  Surgeon: Vinetta Greening, DO;  Location: [facility];  Service: Endoscopy;  Laterality: N/A;  1:00 pm, asa 3   ESOPHAGOGASTRODUODENOSCOPY N/A 02/02/2016   Procedure: ESOPHAGOGASTRODUODENOSCOPY (EGD);  Surgeon: Alyce Jubilee, MD;  Location: [facility];  Service: Endoscopy;  Laterality: N/A;  930    EXTERNAL FIXATION ANKLE FRACTURE     KNEE ARTHROCENTESIS  left   POLYPECTOMY  02/15/2020   Procedure: POLYPECTOMY;  Surgeon: Suzette Espy, MD;  Location: [facility];  Service: Endoscopy;;   POLYPECTOMY  01/17/2023   Procedure: POLYPECTOMY;  Surgeon: Vinetta Greening, DO;  Location: [facility];  Service: Endoscopy;;   POLYPECTOMY  10/10/2023   Procedure: POLYPECTOMY;  Surgeon: Vinetta Greening, DO;  Location: [facility];  Service: Endoscopy;;   TUBAL LIGATION       Allergies  Allergen Reactions   Atorvastatin     GI Issues   Ibuprofen      Do not take since she is on antiplatelet   Norvasc [Amlodipine  Besylate] Other (See Comments)    Reaction:  Fatigue    Penicillins Hives and Other (See Comments)    Has patient had a PCN reaction causing immediate rash, facial/tongue/throat swelling, SOB or lightheadedness with hypotension: Yes Has patient had a PCN reaction causing severe rash involving mucus membranes or skin necrosis: No Has patient had a PCN reaction that  required hospitalization No Has patient had a PCN reaction occurring within the last 10 years: No If all of the above answers are "NO", then may proceed with Cephalosporin use.    Advair Diskus [Fluticasone-Salmeterol] Palpitations   Metformin  And Related Other (See Comments)    Reaction:  GI upset       Family History  Problem Relation Age of Onset   Stroke Mother    Heart attack Mother    Cancer Sister    Diabetes Sister    Seizures Brother    Diabetes Brother    Heart disease Brother    Kidney disease Son    Colon cancer Neg Hx      Social History Ms. Gaydon reports that she has never smoked. She has never used smokeless tobacco. Ms. Sickles reports no history of alcohol use.    Physical Examination Today's Vitals   12/27/23 0829  BP: 134/78  Pulse: (!) 54  SpO2: 98%  Weight: 250 lb (113.4 kg)  Height: 5\' 4"  (1.626 m)   Body mass index is 42.91 kg/m.  Gen: resting comfortably, no acute distress HEENT: no scleral icterus, pupils equal round and reactive, no palptable cervical adenopathy,  CV: RRR, no mrg, no jvd Resp: Clear to auscultation bilaterally GI: abdomen is soft, non-tender, non-distended, normal bowel sounds, no hepatosplenomegaly MSK: extremities are warm, no edema.  Skin: warm, no rash Neuro:  no focal deficits Psych: appropriate affect   Diagnostic Studies  07/2016 Nuclear stress test There was no ST segment deviation noted during stress. The study is normal. No ischemia or infarction. This is a low risk study. Nuclear stress EF: 75%.     05/2011 cath ANGIOGRAPHIC  FINDINGS: 1. The left main coronary artery was a short segment and had no     disease. 2. The left anterior descending was a large vessel that coursed to the     apex and gave off a moderate-sized diagonal Shalana Jardin.  The midportion     of the left anterior descending artery had a mild 20% stenosis.     The diagonal Doneen Ollinger was free of any disease. 3. Circumflex artery is a moderate-sized vessel that gives off a     moderate-sized bifurcating obtuse marginal Alfio Loescher.  This vessel is     free of any disease. 4. The right coronary artery is a moderate-sized dominant vessel with     no evidence of disease. 5. Left ventricular angiogram was performed in the RAO projection and     it showed normal left ventricular systolic function with ejection     fraction of 65-70%.   IMPRESSION: 1. Mild nonobstructive coronary artery disease. 2. Normal left ventricular systolic function. 3. Noncardiac chest pain.     06/2004 cath Left main coronary artery was normal.    The left anterior descending artery was normal.    Circumflex coronary artery is normal.    Right coronary artery was somewhat small but dominant.  It was normal.    Right ventriculography:  The right ventriculography showed hyperdynamic LV  function, ejection fraction of 65%.  There was no gradient across the aortic  valve and no MR.  The aortic pressure was 129/66, LV pressure is 130/90.    IMPRESSION:  The patient has no evidence of significant coronary artery  disease on RAO ventriculogram.  The ascending aorta and descending thoracic  aorta looked fine with no evidence of dissection.  Her chest pain would  appear to be noncardiac in etiology.  She will be discharged later today to  follow up with her primary care M.D.    The patient tolerated the procedure well.   Assessment and Plan   1. Chest pain/mild CAD - long history, multiple negative workups in the past as outlined above -no recent symptoms, continue to  monitor -she reports asked not to take aspirin  in the past by GI, has been on plavix  as alternative given mild CAD by prior cath and multiple risk factors    2. Hyperlipidemia - did not tolerate high dose statin, tolerating pravastatin  - LDL essentially at goal, continue current meds   3. HTN - bp is at goal, continue current meds   F/u 1 year     Laurann Pollock, M.D.

## 2023-12-27 NOTE — Patient Instructions (Signed)

## 2023-12-28 ENCOUNTER — Other Ambulatory Visit: Payer: Self-pay | Admitting: *Deleted

## 2023-12-28 DIAGNOSIS — K7689 Other specified diseases of liver: Secondary | ICD-10-CM

## 2023-12-31 ENCOUNTER — Ambulatory Visit (HOSPITAL_COMMUNITY)
Admission: RE | Admit: 2023-12-31 | Discharge: 2023-12-31 | Disposition: A | Discharge status: Home / Self Care | Source: Ambulatory Visit | Attending: Gastroenterology | Admitting: Gastroenterology

## 2023-12-31 DIAGNOSIS — K7689 Other specified diseases of liver: Secondary | ICD-10-CM | POA: Diagnosis not present

## 2023-12-31 DIAGNOSIS — K760 Fatty (change of) liver, not elsewhere classified: Secondary | ICD-10-CM | POA: Diagnosis not present

## 2023-12-31 DIAGNOSIS — R188 Other ascites: Secondary | ICD-10-CM | POA: Diagnosis not present

## 2023-12-31 DIAGNOSIS — Z9049 Acquired absence of other specified parts of digestive tract: Secondary | ICD-10-CM | POA: Diagnosis not present

## 2023-12-31 MED ORDER — GADOBUTROL 1 MMOL/ML IV SOLN
10.0000 mL | Freq: Once | INTRAVENOUS | Status: AC | PRN
  Administered 2023-12-31: 10 mL via INTRAVENOUS

## 2024-01-02 NOTE — Progress Notes (Signed)
 Dr. Mordechai April ok'yd pt to be put in a new pt slot - called and left her a message to call the office to get scheduled.

## 2024-01-04 ENCOUNTER — Encounter: Payer: Self-pay | Admitting: Family Medicine

## 2024-01-04 ENCOUNTER — Ambulatory Visit (INDEPENDENT_AMBULATORY_CARE_PROVIDER_SITE_OTHER): Admitting: Family Medicine

## 2024-01-04 VITALS — BP 158/94 | HR 60 | Temp 98.4°F | Ht 64.0 in | Wt 247.0 lb

## 2024-01-04 DIAGNOSIS — I10 Essential (primary) hypertension: Secondary | ICD-10-CM | POA: Diagnosis not present

## 2024-01-04 DIAGNOSIS — K7689 Other specified diseases of liver: Secondary | ICD-10-CM

## 2024-01-04 MED ORDER — LOSARTAN POTASSIUM 50 MG PO TABS: ORAL_TABLET | ORAL | 1 refills | Status: DC

## 2024-01-04 NOTE — Progress Notes (Unsigned)
   Subjective:    Patient ID: [name], female    DOB: [birth year], 78 y.o.   MRN: [id number]  HPI  MRI recently, Cyst on liver has grown. Would like referral   Patient relates a lot of right upper quadrant soreness tenderness just not feeling good She has a cyst that is reoccurring She states she has not had any vomiting or diarrhea She relates her energy level is subpar feels fatigued tired No appetite She finds herself emotionally distressed about this    Review of Systems     Objective:   Physical Exam Lungs clear heart regular abdomen right upper quadrant subjective tenderness noted no mass felt extremities no edema       Assessment & Plan:   Liver cyst Will work to set her up with specialist at [facility] per patient request More than likely they will try various measures to try to make this cyst smaller.  Not sure if they can truly do any type of curative surgery Patient was counseled regarding what to watch for  Referral initiated to the gastroenterology team at [facility] that way the hepatologist can decide what would be the best thing for her cyst

## 2024-01-05 ENCOUNTER — Other Ambulatory Visit (HOSPITAL_COMMUNITY)

## 2024-01-05 ENCOUNTER — Telehealth: Payer: Self-pay | Admitting: Family Medicine

## 2024-01-05 ENCOUNTER — Other Ambulatory Visit: Payer: Self-pay

## 2024-01-05 DIAGNOSIS — K7689 Other specified diseases of liver: Secondary | ICD-10-CM

## 2024-01-05 NOTE — Telephone Encounter (Signed)
 Nurses Please do referral [facility] liver specialist Dr. Philemon Braver with [facility] gastroenterology Patient has a large liver cyst that is causing pain and discomfort She would like to have this evaluated to see if they can handle this or if possible refer her to one of the specialist within their system who could do surgery She would prefer [facility] If this is not if this facility is not covered under her insurance please let us  know and we can go with a facility that is She previously had surgery at [facility] for this but does not prefer to go back there So therefore I recommend [facility] Thanks-Dr. Geralyn Knee

## 2024-01-05 NOTE — Telephone Encounter (Signed)
 Referral placed.

## 2024-01-05 NOTE — Progress Notes (Signed)
 Pt.notified

## 2024-01-12 ENCOUNTER — Ambulatory Visit: Payer: Self-pay

## 2024-01-24 ENCOUNTER — Ambulatory Visit (INDEPENDENT_AMBULATORY_CARE_PROVIDER_SITE_OTHER): Admitting: Nurse Practitioner

## 2024-01-24 ENCOUNTER — Ambulatory Visit: Payer: Self-pay

## 2024-01-24 ENCOUNTER — Encounter: Payer: Self-pay | Admitting: Nurse Practitioner

## 2024-01-24 VITALS — BP 142/78 | HR 79 | Temp 98.1°F | Ht 64.0 in | Wt 245.8 lb

## 2024-01-24 DIAGNOSIS — K7689 Other specified diseases of liver: Secondary | ICD-10-CM | POA: Diagnosis not present

## 2024-01-24 DIAGNOSIS — R1011 Right upper quadrant pain: Secondary | ICD-10-CM | POA: Diagnosis not present

## 2024-01-24 MED ORDER — HYDROCODONE-ACETAMINOPHEN 5-325 MG PO TABS: ORAL_TABLET | ORAL | 0 refills | Status: DC

## 2024-01-24 NOTE — Progress Notes (Signed)
 Subjective:    Patient ID: [name], female    DOB: [birth year], 78 y.o.   MRN: [id number]  HPI Presents for complaints of significant abdominal pain that occurred all day yesterday.  Describes as a stabbing pain in the right upper abdominal area.  States she was fine when she went to church 2 days ago.  Symptoms were bad enough that she stayed at home and was too exhausted to do anything.  Today the pain is more of an ache.  No fever.  Nausea but no vomiting.  Has had a flareup of her constipation, has started back taking her Linzess  yesterday which she takes as needed.  Minimal relief with Tylenol  yesterday for the pain.  Patient has a cyst on her liver, has had a workup.  A referral has been placed to [facility], patient has not received word about appointment.  States she called this morning but could not get anyone on the phone.  Taking fluids well.  Voiding normal limit.  No rash in the area of the pain.  Patient has been very anxious about the cyst after talking to the local GI specialist office who advised her about the dangers of the cyst rupturing.  Has had the cyst drained before by [facility] surgery.  Notes she had a cholecystectomy years ago.   Review of Systems  Constitutional:  Negative for fever.  Respiratory:  Negative for cough, chest tightness and shortness of breath.   Cardiovascular:  Negative for chest pain.  Gastrointestinal:  Positive for abdominal pain, constipation and nausea. Negative for diarrhea and vomiting.  Genitourinary:  Negative for dysuria, frequency and urgency.  Skin:  Negative for rash.      01/24/2024   10:23 AM  Depression screen PHQ 2/9  Decreased Interest 0  Down, Depressed, Hopeless 0  PHQ - 2 Score 0  Altered sleeping 0  Tired, decreased energy 0  Change in appetite 0  Feeling bad or failure about yourself  0  Trouble concentrating 0  Moving slowly or fidgety/restless 0  Suicidal thoughts 0  PHQ-9 Score 0       01/24/2024   10:23 AM 01/04/2024   10:50 AM 12/02/2023    9:15 AM 11/18/2023   10:34 AM  GAD 7 : Generalized Anxiety Score  Nervous, Anxious, on Edge 0 0 1 1  Control/stop worrying 0 0 1 1  Worry too much - different things 1 2 1 1   Trouble relaxing 1 0 1 1  Restless 0 0 0 0  Easily annoyed or irritable 0 0 0 1  Afraid - awful might happen 0 0 0 0  Total GAD 7 Score 2 2 4 5   Anxiety Difficulty Not difficult at all Somewhat difficult Not difficult at all Not difficult at all         Objective:   Physical Exam NAD.  Alert, oriented.  Lungs clear.  Heart regular rate rhythm.  Abdomen soft nondistended with hypoactive bowel sounds x 4.  Distinct tenderness noted in the right upper quadrant of the abdomen.  No rebound or guarding.  No obvious masses. Today's Vitals   01/24/24 1010  BP: (!) 142/78  Pulse: 79  Temp: 98.1 F (36.7 C)  SpO2: 99%  Weight: 245 lb 12.8 oz (111.5 kg)  Height: 5\' 4"  (1.626 m)   Body mass index is 42.19 kg/m.        Assessment & Plan:   Problem List Items Addressed This Visit  Digestive   Hepatic cyst - Primary   Relevant Orders   CBC with Differential/Platelet   Hepatic function panel   US  Abdomen Complete     Other   RUQ pain   Relevant Orders   CBC with Differential/Platelet   Hepatic function panel   Lipase   Consulted with Dr. Geralyn Knee before the patient's visit. With the sudden onset of significant pain, or repeat ultrasound to remeasure the cyst. Patient to continue to contact [facility] to schedule her appointment.  If any problems regarding the referral or if they cannot see her for a long period of time, she is to contact our office. Lab work ordered. Warning signs reviewed.  Call back if new or worsening symptoms.

## 2024-01-24 NOTE — Telephone Encounter (Signed)
 Copied from CRM [id number]. Topic: Referral - Status >> Jan 24, 2024  1:49 PM Zipporah Him wrote: Reason for CRM: Patient calling in to let Michelle Aid know her upcoming appointment is at  [facility] Aug 19 @ 1:30PM, Dr Stephani Ege.

## 2024-01-24 NOTE — Patient Instructions (Signed)
 [facility] - Dr. King Penning [address], [place] [id number] [place] [id number] [phone]

## 2024-01-24 NOTE — Telephone Encounter (Signed)
 Copied from CRM [id number]. Topic: Clinical - Red Word Triage >> Jan 24, 2024  8:07 AM [place] A wrote: [facility] that prompted transfer to Nurse Triage: Patient has a Cyst in her stomach and is having great deal of pain. Initially patient called to check referall status Agent read to hear 5-15 message as pending   Chief Complaint: Abdominal Pain Symptoms: pain, nausea Frequency: started yesterday Pertinent Negatives: Patient denies vomiting, diarrhea,  Disposition: [] ED /[] Urgent Care (no appt availability in office) / [] Appointment(In office/virtual)/ []  [place] [facility]/ [] Home Care/ [x] Refused Recommended Disposition /[] [place] [facility]/ []  Follow-up with PCP Additional Notes: Patient called and advised that she has a cyst on her liver and she was told that it could rupture and she was supposed to get a referral to [facility]. 4 out of 10 pain. Patient was waiting on a referral to see a specialist about a cyst on her liver.  Patient denies vomiting or diarrhea. Patient is frustrated with not knowing what hospital to go to and because she hasn't heard back about the referral. Patient is advised that the recommendation at this time is for her to go to the Emergency Room. Patient asked which Emergency Room because she states she is supposed to go to a certain hospital. She states that the last time she wen to the local ER she was sent home. This RN called the CAL to further assist the patient at this time. Patient connected to the Office who were going to take care of scheduling her in the office if she didn't want to go to to the ER at this time.    Reason for Disposition  [1] Pain lasts > 10 minutes AND [2] age > 50  Protocols used: Abdominal Pain - Upper-A-AH

## 2024-01-25 ENCOUNTER — Ambulatory Visit: Payer: Self-pay

## 2024-01-25 LAB — CBC WITH DIFFERENTIAL/PLATELET
Basophils Absolute: 0 10*3/uL (ref 0.0–0.2)
Basos: 1 %
EOS (ABSOLUTE): 0.2 10*3/uL (ref 0.0–0.4)
Eos: 3 %
Hematocrit: 44.6 % (ref 34.0–46.6)
Hemoglobin: 13.8 g/dL (ref 11.1–15.9)
Immature Grans (Abs): 0 10*3/uL (ref 0.0–0.1)
Immature Granulocytes: 0 %
Lymphocytes Absolute: 3.1 10*3/uL (ref 0.7–3.1)
Lymphs: 50 %
MCH: 24.3 pg — ABNORMAL LOW (ref 26.6–33.0)
MCHC: 30.9 g/dL — ABNORMAL LOW (ref 31.5–35.7)
MCV: 79 fL (ref 79–97)
Monocytes Absolute: 0.5 10*3/uL (ref 0.1–0.9)
Monocytes: 8 %
Neutrophils Absolute: 2.3 10*3/uL (ref 1.4–7.0)
Neutrophils: 38 %
Platelets: 353 10*3/uL (ref 150–450)
RBC: 5.68 x10E6/uL — ABNORMAL HIGH (ref 3.77–5.28)
RDW: 15.3 % (ref 11.7–15.4)
WBC: 6 10*3/uL (ref 3.4–10.8)

## 2024-01-25 LAB — HEPATIC FUNCTION PANEL
ALT: 17 IU/L (ref 0–32)
AST: 23 IU/L (ref 0–40)
Albumin: 4.4 g/dL (ref 3.8–4.8)
Alkaline Phosphatase: 92 IU/L (ref 44–121)
Bilirubin Total: 0.4 mg/dL (ref 0.0–1.2)
Bilirubin, Direct: 0.14 mg/dL (ref 0.00–0.40)
Total Protein: 7 g/dL (ref 6.0–8.5)

## 2024-01-25 LAB — LIPASE: Lipase: 37 U/L (ref 14–85)

## 2024-01-25 NOTE — Telephone Encounter (Signed)
 Nurses-this patient was seen by Orelia Binet yesterday. Orelia Binet ordered lab work which came back looking good-please make sure patient is aware the lab work looked good.  Orelia Binet also ordered a ultrasound.  If possible please make this ultrasound stat.   Please let her know that we can call to see if we can get her in sooner but it is unlikely they will be able to see her this week I do not have any availabilities for today Trying to find out what is she requesting? Please find out from the patient what is different regarding her issue from today compared to yesterday?

## 2024-01-25 NOTE — Telephone Encounter (Signed)
  Chief Complaint: upper abdominal pain Symptoms: pain Frequency: constant Pertinent Negatives: Patient denies new symptoms Disposition: [] ED /[] Urgent Care (no appt availability in office) / [] Appointment(In office/virtual)/ []  [place] [facility]/ [] Home Care/ [] Refused Recommended Disposition /[]  Mobile Bus/ [x]  Follow-up with PCP Additional Notes:  Evaluated on 01/24/24 for upper abdominal pain, known hepatic cyst, pain is worsening. Calling today as advised because she is unable to get in with [facility] until 04/17/24. Wants to get in this week. Would like recommendations.    Copied from CRM [id number]. Topic: Clinical - Red Word Triage >> Jan 25, 2024 12:19 PM Marissa P wrote: Red Word that prompted transfer to Nurse Triage: Patient still having lots of pain, cyst is on her breast right side and needs to be seen please Reason for Disposition  [1] Pain lasts > 10 minutes AND [2] age > 50  Protocols used: Abdominal Pain - Upper-A-AH

## 2024-01-26 ENCOUNTER — Ambulatory Visit (HOSPITAL_COMMUNITY)
Admission: RE | Admit: 2024-01-26 | Discharge: 2024-01-26 | Disposition: A | Discharge status: Home / Self Care | Source: Ambulatory Visit | Attending: Nurse Practitioner | Admitting: Nurse Practitioner

## 2024-01-26 ENCOUNTER — Encounter: Payer: Self-pay | Admitting: Internal Medicine

## 2024-01-26 ENCOUNTER — Other Ambulatory Visit: Payer: Self-pay

## 2024-01-26 DIAGNOSIS — K7689 Other specified diseases of liver: Secondary | ICD-10-CM

## 2024-01-26 DIAGNOSIS — K760 Fatty (change of) liver, not elsewhere classified: Secondary | ICD-10-CM | POA: Diagnosis not present

## 2024-01-26 DIAGNOSIS — R109 Unspecified abdominal pain: Secondary | ICD-10-CM | POA: Diagnosis not present

## 2024-01-26 NOTE — Telephone Encounter (Signed)
--  Copied from CRM [id number]. Topic: Clinical - Lab/Test Results >> Jan 26, 2024  3:13 PM Phil Braun wrote: Reason for CRM: pt was told to call the office today at 3 for ultrasiound results. It appeared that the results are not ready. Please call and advise when ready.

## 2024-01-26 NOTE — Telephone Encounter (Signed)
 Ultrasound has been changed to stat and scheduled for today as soon as possible today. For abd pain in ruq area.

## 2024-01-27 ENCOUNTER — Ambulatory Visit: Payer: Self-pay | Admitting: Family Medicine

## 2024-01-27 NOTE — Telephone Encounter (Signed)
 Pt has been informed per provider results and recommendations, pt verbalizes understanding.

## 2024-01-27 NOTE — Telephone Encounter (Signed)
 Nurses-ultrasound actually shows that the cyst is a little bit smaller compared to the MRI (but this could be a variation based on technique) In other words the take-home message is the cyst is not ruptured.  It has not gotten any larger.  It is stable.  I would not recommend going to the ER.  Please give her some reassurance. We will put a call into gastroenterology at [facility] to see if they would be willing to see her sooner.  I should be able to hear something from them within the next 1-3 business days then be able to get back with Sydna Evangelist regarding the findings and recommendations from gastroenterology thank you

## 2024-01-31 ENCOUNTER — Ambulatory Visit (INDEPENDENT_AMBULATORY_CARE_PROVIDER_SITE_OTHER): Admitting: Psychiatry

## 2024-01-31 ENCOUNTER — Encounter (HOSPITAL_COMMUNITY): Payer: Self-pay | Admitting: Psychiatry

## 2024-01-31 DIAGNOSIS — F411 Generalized anxiety disorder: Secondary | ICD-10-CM
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
## 2024-02-10 ENCOUNTER — Ambulatory Visit: Payer: Self-pay | Admitting: Family Medicine

## 2024-02-10 ENCOUNTER — Telehealth: Payer: Self-pay | Admitting: *Deleted

## 2024-02-10 NOTE — Telephone Encounter (Signed)
 I have tried to get a hold of the gastroenterology people at [facility] I have left messages we will try again Orelia Binet prescribed hydrocodone  for severe pain.  Please talk with patient and see how things are going.

## 2024-02-10 NOTE — Telephone Encounter (Signed)
 Copied from CRM [id number]. Topic: Clinical - Medical Advice >> Feb 10, 2024  9:17 AM [name] wrote: Reason for CRM: Patient states she's tired and exhausted of the pain she feels from the cyst on her liver. She is waiting to see when she can get into [facility]. Please call patient as advise. Patient refused speaking with nurse triage in regard to pain sh is having. States she has meds just needs relief. CAL advised they would follow up with Dr Fairy Homer.

## 2024-02-11 DIAGNOSIS — K7689 Other specified diseases of liver: Secondary | ICD-10-CM | POA: Diagnosis not present

## 2024-02-11 DIAGNOSIS — R1011 Right upper quadrant pain: Secondary | ICD-10-CM | POA: Diagnosis not present

## 2024-02-11 DIAGNOSIS — I119 Hypertensive heart disease without heart failure: Secondary | ICD-10-CM | POA: Diagnosis not present

## 2024-02-11 DIAGNOSIS — R109 Unspecified abdominal pain: Secondary | ICD-10-CM | POA: Diagnosis not present

## 2024-02-11 DIAGNOSIS — R160 Hepatomegaly, not elsewhere classified: Secondary | ICD-10-CM | POA: Diagnosis not present

## 2024-02-11 DIAGNOSIS — I2510 Atherosclerotic heart disease of native coronary artery without angina pectoris: Secondary | ICD-10-CM | POA: Diagnosis not present

## 2024-02-11 DIAGNOSIS — E119 Type 2 diabetes mellitus without complications: Secondary | ICD-10-CM | POA: Diagnosis not present

## 2024-02-11 DIAGNOSIS — R079 Chest pain, unspecified: Secondary | ICD-10-CM | POA: Diagnosis not present

## 2024-02-11 DIAGNOSIS — E785 Hyperlipidemia, unspecified: Secondary | ICD-10-CM | POA: Diagnosis not present

## 2024-02-11 DIAGNOSIS — I10 Essential (primary) hypertension: Secondary | ICD-10-CM | POA: Diagnosis not present

## 2024-02-11 DIAGNOSIS — Z6841 Body Mass Index (BMI) 40.0 and over, adult: Secondary | ICD-10-CM | POA: Diagnosis not present

## 2024-02-11 DIAGNOSIS — K219 Gastro-esophageal reflux disease without esophagitis: Secondary | ICD-10-CM | POA: Diagnosis not present

## 2024-02-11 DIAGNOSIS — M791 Myalgia, unspecified site: Secondary | ICD-10-CM | POA: Diagnosis not present

## 2024-02-13 ENCOUNTER — Other Ambulatory Visit: Payer: Self-pay | Admitting: Family Medicine

## 2024-02-14 NOTE — Telephone Encounter (Signed)
 Patient stated she is currently in the hospital at [facility] and scheduled for surgery in the morning. Patient stated she got sick on Saturday and went to [facility] ER and was admitted- she actually has 2 cysts and one is over 17mm and pressing on her ribs causing the pain- she said she is available by cell if you would like to speak with her

## 2024-02-14 NOTE — Telephone Encounter (Signed)
 Nurses-I spoke with patient-she is within [facility] will have a interventional drainage of the cyst later on Wednesday possibly goes home Wednesday or Thursday I told her that our office will call her on Friday-please flag this chart accordingly to handle this on Friday  Please call her on her cell phone on Friday see how her procedure went and go ahead and set her up with a open slot visit with myself next week if she is able to come to the office if not you can schedule a couple weeks down the road

## 2024-02-14 NOTE — Telephone Encounter (Signed)
 I was able to speak with the patient by phone Please see other telephone message

## 2024-02-20 ENCOUNTER — Other Ambulatory Visit: Payer: Self-pay | Admitting: Family Medicine

## 2024-02-22 ENCOUNTER — Encounter: Payer: Self-pay | Admitting: Family Medicine

## 2024-02-22 ENCOUNTER — Ambulatory Visit (INDEPENDENT_AMBULATORY_CARE_PROVIDER_SITE_OTHER): Payer: Medicare HMO | Admitting: Family Medicine

## 2024-02-22 VITALS — BP 138/76 | HR 41 | Temp 97.7°F | Ht 64.0 in | Wt 244.0 lb

## 2024-02-22 DIAGNOSIS — R1011 Right upper quadrant pain: Secondary | ICD-10-CM | POA: Diagnosis not present

## 2024-02-22 DIAGNOSIS — E1140 Type 2 diabetes mellitus with diabetic neuropathy, unspecified: Secondary | ICD-10-CM

## 2024-02-22 DIAGNOSIS — K7689 Other specified diseases of liver: Secondary | ICD-10-CM | POA: Diagnosis not present

## 2024-02-22 NOTE — Telephone Encounter (Signed)
 Patient seen in office for hospital follow up 02/22/24

## 2024-02-22 NOTE — Progress Notes (Signed)
   Subjective:    Patient ID: [name], female    DOB: February 11, 1946, 78 y.o.   MRN: [id number]  HPI Hospital follow up - liver procedure done x 2 wed and fri last week Cysts fluid drained  Elevated bp , low heart rate Pain no fever  Needing dressing changes ,has appt w/ surgeon in August -  Initially this was a physical but patient is here today for hospital follow-up from a liver cyst which was drained via interventional radiology via [facility] Records were reviewed Patient complains of a lot of stiffness pain discomfort increased pain with movement denies high fever chills sweats denies any purulent drainage Has underlying history of morbid obesity hypertension and type 2 diabetes Review of Systems     Objective:   Physical Exam General-in no acute distress Eyes-no discharge Lungs-respiratory rate normal, CTA CV-no murmurs,RRR Extremities skin warm dry no edema Neuro grossly normal Behavior normal, alert Soreness in the right upper quadrant no sign of any abscess where the percutaneous catheter went in there is no sign of any drainage or infection  Dressing was changed by the end of this week she can stop the dressing In addition to that she can do general range of motion exercises and gradually get back into the swing of things and hopefully over the next 7 to 10 days get away from being on pain medicine     Assessment & Plan:  1. RUQ pain (Primary) Ultrasound not indicated currently will follow-up again in a few weeks Lab work ordered await the results of this If high fevers purulent drainage or increased swelling follow-up immediately here or ER - CBC with Differential - COMPLETE METABOLIC PANEL WITH eGFR - Lipase - Comprehensive metabolic panel  2. Hepatic cyst I am hopeful that the most recent procedure will keep this under good control but obviously if things get worse follow-up ultrasound necessary  3. Type 2 diabetes mellitus with diabetic  neuropathy, without long-term current use of insulin  (HCC) Previous A1c look good not time for the lab work at this time

## 2024-02-23 ENCOUNTER — Ambulatory Visit: Payer: Self-pay | Admitting: Family Medicine

## 2024-02-23 LAB — CBC WITH DIFFERENTIAL/PLATELET
Basophils Absolute: 0 10*3/uL (ref 0.0–0.2)
Basos: 1 %
EOS (ABSOLUTE): 0.2 10*3/uL (ref 0.0–0.4)
Eos: 3 %
Hematocrit: 44 % (ref 34.0–46.6)
Hemoglobin: 13.2 g/dL (ref 11.1–15.9)
Immature Grans (Abs): 0 10*3/uL (ref 0.0–0.1)
Immature Granulocytes: 0 %
Lymphocytes Absolute: 2.8 10*3/uL (ref 0.7–3.1)
Lymphs: 43 %
MCH: 24.4 pg — ABNORMAL LOW (ref 26.6–33.0)
MCHC: 30 g/dL — ABNORMAL LOW (ref 31.5–35.7)
MCV: 82 fL (ref 79–97)
Monocytes Absolute: 0.5 10*3/uL (ref 0.1–0.9)
Monocytes: 7 %
Neutrophils Absolute: 3 10*3/uL (ref 1.4–7.0)
Neutrophils: 46 %
Platelets: 436 10*3/uL (ref 150–450)
RBC: 5.4 x10E6/uL — ABNORMAL HIGH (ref 3.77–5.28)
RDW: 15.6 % — ABNORMAL HIGH (ref 11.7–15.4)
WBC: 6.5 10*3/uL (ref 3.4–10.8)

## 2024-02-23 LAB — COMPREHENSIVE METABOLIC PANEL WITH GFR
ALT: 26 IU/L (ref 0–32)
AST: 16 IU/L (ref 0–40)
Albumin: 4.4 g/dL (ref 3.8–4.8)
Alkaline Phosphatase: 88 IU/L (ref 44–121)
BUN/Creatinine Ratio: 16 (ref 12–28)
BUN: 15 mg/dL (ref 8–27)
Bilirubin Total: 0.2 mg/dL (ref 0.0–1.2)
CO2: 22 mmol/L (ref 20–29)
Calcium: 9.9 mg/dL (ref 8.7–10.3)
Chloride: 102 mmol/L (ref 96–106)
Creatinine, Ser: 0.94 mg/dL (ref 0.57–1.00)
Globulin, Total: 2.8 g/dL (ref 1.5–4.5)
Glucose: 131 mg/dL — ABNORMAL HIGH (ref 70–99)
Potassium: 4.9 mmol/L (ref 3.5–5.2)
Sodium: 141 mmol/L (ref 134–144)
Total Protein: 7.2 g/dL (ref 6.0–8.5)
eGFR: 63 mL/min/{1.73_m2} (ref >59)

## 2024-02-23 LAB — LIPASE: Lipase: 37 U/L (ref 14–85)

## 2024-03-14 ENCOUNTER — Ambulatory Visit: Admitting: Family Medicine

## 2024-03-14 VITALS — BP 128/70 | HR 47 | Temp 98.2°F | Ht 64.0 in | Wt 244.2 lb

## 2024-03-14 DIAGNOSIS — R1011 Right upper quadrant pain: Secondary | ICD-10-CM

## 2024-03-14 DIAGNOSIS — R252 Cramp and spasm: Secondary | ICD-10-CM | POA: Diagnosis not present

## 2024-03-14 DIAGNOSIS — K7689 Other specified diseases of liver: Secondary | ICD-10-CM | POA: Diagnosis not present

## 2024-03-14 NOTE — Progress Notes (Signed)
   Subjective:    Patient ID: KLAIRE COURT, female    DOB: Apr 14, 1946, 78 y.o.   MRN: [id number]  HPI Pt comes in today for 3 week follow up. PT is still having pain in on the R side along with cramps in lower abdomen and hands. Insition site causing irritation. Medications and allergies reviewed. Patient had a large cyst this was drained by interventional radiology [facility] they do not do a follow-up visit until mid August she relates a lot of pain discomfort nausea and not feeling like she wants to eat  This been present over the past couple weeks no fevers Discussed the use of AI scribe software for clinical note transcription with the patient, who gave verbal consent to proceed.  History of Present Illness   MAKINZE JANI is a 78 year old female who presents with persistent pain and decreased appetite.  She experiences persistent pain, particularly when putting on her bra, which radiates from the front to the side. The pain is described as 'hurts' and is present most of the time.  She has a significant decrease in appetite, stating she does not feel like eating and experiences nausea when attempting to eat. Her usual diet includes Glucerna, Cheerios, and occasionally peanut butter, but she has not been consuming her typical meals like eggs for breakfast or chicken for dinner.  Her son recently underwent back surgery and is currently on dialysis, which has added stress to her situation. She is unable to care for him due to her own health issues and mobility limitations, as she requires a driver to get around.  She experiences cramps in her groin area after spending a day in a wheelchair at the hospital, which she attributes to increased physical activity beyond her usual capacity.  She notes difficulty sleeping, feeling overwhelmed, and lacking energy to cook or perform daily activities. Her daughter assists with meals, but she declines food due to nausea.       Review of  Systems     Objective:   Physical Exam General-in no acute distress Eyes-no discharge Lungs-respiratory rate normal, CTA CV-no murmurs,RRR Extremities skin warm dry no edema Neuro grossly normal Behavior normal, alert  Mild tenderness right upper quadrant no guarding or rebound      Assessment & Plan:   Right-sided abdominal pain Chronic pain likely related to a liver cyst, previously drained. Concern about cyst size and persistence. - Order blood work for liver enzymes, electrolytes, and infection-fighting cell count. - Schedule ultrasound to evaluate liver cyst. - Consult specialists if cyst size is concerning.  Nausea and decreased appetite Nausea and decreased appetite leading to inadequate nutrition. Relies on Glucerna for nutrition. - Prescribe antiemetic medication. - Encourage small, frequent meals: cereals, oatmeal, eggs for breakfast; peanut butter for lunch; chicken with vegetables for dinner.  Muscle cramps Cramps likely due to decreased activity and recent exertion in a wheelchair, not an underlying condition. - Advise improving nutritional intake to alleviate cramps.  Follow-up Follow-up necessary to evaluate test results and guide management. - Review blood work and ultrasound results. - Schedule follow-up appointment based on test results.

## 2024-03-15 ENCOUNTER — Ambulatory Visit: Payer: Self-pay | Admitting: Family Medicine

## 2024-03-15 ENCOUNTER — Ambulatory Visit (HOSPITAL_COMMUNITY): Admitting: Psychiatry

## 2024-03-15 ENCOUNTER — Ambulatory Visit (INDEPENDENT_AMBULATORY_CARE_PROVIDER_SITE_OTHER): Admitting: Psychiatry

## 2024-03-15 DIAGNOSIS — F411 Generalized anxiety disorder: Secondary | ICD-10-CM

## 2024-03-15 LAB — CBC
Hematocrit: 46.7 % — ABNORMAL HIGH (ref 34.0–46.6)
Hemoglobin: 13.7 g/dL (ref 11.1–15.9)
MCH: 24 pg — ABNORMAL LOW (ref 26.6–33.0)
MCHC: 29.3 g/dL — ABNORMAL LOW (ref 31.5–35.7)
MCV: 82 fL (ref 79–97)
Platelets: 334 x10E3/uL (ref 150–450)
RBC: 5.72 x10E6/uL — ABNORMAL HIGH (ref 3.77–5.28)
RDW: 16.5 % — ABNORMAL HIGH (ref 11.7–15.4)
WBC: 5.8 x10E3/uL (ref 3.4–10.8)

## 2024-03-15 LAB — HEPATIC FUNCTION PANEL
ALT: 18 IU/L (ref 0–32)
AST: 21 IU/L (ref 0–40)
Albumin: 4.2 g/dL (ref 3.8–4.8)
Alkaline Phosphatase: 90 IU/L (ref 44–121)
Bilirubin Total: 0.4 mg/dL (ref 0.0–1.2)
Bilirubin, Direct: 0.16 mg/dL (ref 0.00–0.40)
Total Protein: 6.8 g/dL (ref 6.0–8.5)

## 2024-03-15 LAB — LIPASE: Lipase: 46 U/L (ref 14–85)
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
## 2024-03-23 ENCOUNTER — Ambulatory Visit (HOSPITAL_COMMUNITY)
Admission: RE | Admit: 2024-03-23 | Discharge: 2024-03-23 | Disposition: A | Discharge status: Home / Self Care | Source: Ambulatory Visit | Attending: Family Medicine | Admitting: Family Medicine

## 2024-03-23 DIAGNOSIS — R1011 Right upper quadrant pain: Secondary | ICD-10-CM | POA: Insufficient documentation

## 2024-03-23 DIAGNOSIS — K7689 Other specified diseases of liver: Secondary | ICD-10-CM | POA: Diagnosis not present

## 2024-03-23 DIAGNOSIS — Z9049 Acquired absence of other specified parts of digestive tract: Secondary | ICD-10-CM | POA: Diagnosis not present

## 2024-03-23 DIAGNOSIS — K760 Fatty (change of) liver, not elsewhere classified: Secondary | ICD-10-CM | POA: Diagnosis not present

## 2024-03-24 ENCOUNTER — Other Ambulatory Visit: Payer: Self-pay | Admitting: Gastroenterology

## 2024-03-26 ENCOUNTER — Telehealth: Payer: Self-pay | Admitting: *Deleted

## 2024-03-26 NOTE — Telephone Encounter (Signed)
 Copied from CRM [id number]. Topic: General - Other >> Mar 26, 2024  2:56 PM Antwanette L wrote: Reason for CRM: Pt is calling to give Dr. Alphonsa the name of her hepatologist (liver doctor) which is Dr. Vena Deutsch-Link. The office  is located at [address], [place], [place] [address] and the phone number is [phone]

## 2024-04-05 ENCOUNTER — Ambulatory Visit (HOSPITAL_COMMUNITY): Admitting: Psychiatry

## 2024-04-17 ENCOUNTER — Ambulatory Visit: Payer: Self-pay

## 2024-04-17 DIAGNOSIS — R109 Unspecified abdominal pain: Secondary | ICD-10-CM | POA: Diagnosis not present

## 2024-04-17 NOTE — Telephone Encounter (Signed)
  FYI Only or Action Required?: Action required by provider: update on patient condition.  Patient was last seen in primary care on 03/14/2024 by Alphonsa Glendia LABOR, MD.  Called Nurse Triage reporting low blood pressure.  Symptoms began today.  Interventions attempted: Nothing.  Symptoms are: gradually improving.  Triage Disposition: See Physician Within 24 Hours  Patient/caregiver understands and will follow disposition?: Yes Summary: Pt returning Call    Pt returning Leonce Corn R, LPN call for blood pressure and any symptoms.Please call pt at [phone].         Reason for Disposition  [1] Systolic BP 90-110 AND [2] taking blood pressure medications AND [3] NOT feeling weak or lightheaded  Answer Assessment - Initial Assessment Questions Patient was calling to return call to Dr. Steven office  1. BLOOD PRESSURE: What is your blood pressure? Did you take at least two measurements 5 minutes apart?     97/39 2. ONSET: When did you take your blood pressure?     Blood pressure taking this morning at GI office 4. HISTORY: Do you have a history of low blood pressure? What is your blood pressure normally?     History of hypertension, on medication 5. MEDICINES: Are you taking any medicines for blood pressure? If Yes, ask: Have they been changed recently?     No recent changes 6. PULSE RATE: Do you know what your pulse rate is?      45 this morning at GI office 7. OTHER SYMPTOMS: Have you been sick recently? Have you had a recent injury?     Feels weak after taking blood pressure medicine and lays down stating that she did not know her blood pressure was that low 8. PREGNANCY: Is there any chance you are pregnant? When was your last menstrual period?     N/A  Protocols used: Blood Pressure - Low-A-AH

## 2024-04-17 NOTE — Telephone Encounter (Signed)
 FYI Only or Action Required?: FYI only for provider.  Patient was last seen in primary care on 03/14/2024 by Alphonsa Glendia LABOR, MD.  Called Nurse Triage reporting Low Blood Pressure .  Symptoms began today.  Interventions attempted: Nothing.  Symptoms are: unchanged.  Triage Disposition: See PCP When Office is Open (Within 3 Days)  Patient/caregiver understands and will follow disposition?: Yes         Copied from CRM #[id number]. Topic: Clinical - Red Word Triage >> Apr 17, 2024  4:00 PM Rosaria BRAVO wrote: Red Word that prompted transfer to Nurse Triage: BP 97/39       Reason for Disposition  Diastolic BP < 50 mmHg  Answer Assessment - Initial Assessment Questions 1. BLOOD PRESSURE: What is your blood pressure? Did you take at least two measurements 5 minutes apart?     97/39 2. ONSET: When did you take your blood pressure?     Today at a gastroenterologist appointment  3. HOW: How did you take your blood pressure? (e.g., visiting nurse, automatic home BP monitor)     Taken at an office  4. HISTORY: Do you have a history of low blood pressure? What is your blood pressure normally?     No 5. MEDICINES: Are you taking any medicines for blood pressure? If Yes, ask: Have they been changed recently?     Takes medication for blood pressure  6. PULSE RATE: Do you know what your pulse rate is?      45 7. OTHER SYMPTOMS: Have you been sick recently? Have you had a recent injury?     No symptoms with her low blood pressure  Protocols used: Blood Pressure - Low-A-AH

## 2024-04-17 NOTE — Telephone Encounter (Signed)
 Left a message for a return call and provide bp reading and any symptoms

## 2024-04-18 NOTE — Telephone Encounter (Signed)
 Did speak with provider regarding BP readings and recommendations, tried calling patient and left a message to inform per provider may stop losartan  50 mg and if needs to be seen today we can bring her in at 4 pm or if she is ok to wait until tomorrow she may go ahead and stop losartan  medication.

## 2024-04-19 ENCOUNTER — Ambulatory Visit (INDEPENDENT_AMBULATORY_CARE_PROVIDER_SITE_OTHER): Admitting: Family Medicine

## 2024-04-19 ENCOUNTER — Ambulatory Visit (HOSPITAL_COMMUNITY): Admitting: Psychiatry

## 2024-04-19 VITALS — BP 148/84 | HR 50 | Ht 64.0 in | Wt 251.2 lb

## 2024-04-19 DIAGNOSIS — R001 Bradycardia, unspecified: Secondary | ICD-10-CM | POA: Diagnosis not present

## 2024-04-19 DIAGNOSIS — I10 Essential (primary) hypertension: Secondary | ICD-10-CM | POA: Diagnosis not present

## 2024-04-19 DIAGNOSIS — K7689 Other specified diseases of liver: Secondary | ICD-10-CM

## 2024-04-19 MED ORDER — AMLODIPINE BESYLATE 2.5 MG PO TABS: 2.5000 mg | ORAL_TABLET | Freq: Every day | ORAL | 1 refills | Status: DC

## 2024-04-19 MED ORDER — AMLODIPINE BESYLATE 2.5 MG PO TABS: 2.5000 mg | ORAL_TABLET | Freq: Every day | ORAL | 3 refills | Status: DC

## 2024-04-19 NOTE — Telephone Encounter (Signed)
 Was seen today.

## 2024-04-19 NOTE — Telephone Encounter (Signed)
 Patient has cyst, GI at [facility] planning on doing surgical procedure they are lining this up Patient was instructed to stay off of Plavix  7 days ahead of the procedure

## 2024-04-19 NOTE — Progress Notes (Signed)
    04/19/2024   11:16 AM 03/14/2024   10:13 AM 02/22/2024    9:11 AM 01/31/2024    9:18 AM 01/24/2024   10:23 AM  Depression screen PHQ 2/9  Decreased Interest 0 0 0  0  Down, Depressed, Hopeless 0 0 0  0  PHQ - 2 Score 0 0 0  0  Altered sleeping 2 2 0  0  Tired, decreased energy 3 2 1   0  Change in appetite 0 0 2  0  Feeling bad or failure about yourself  0 0 0  0  Trouble concentrating 0 0 0  0  Moving slowly or fidgety/restless 0 0 0  0  Suicidal thoughts 0 0 0  0  PHQ-9 Score 5 4 3   0  Difficult doing work/chores  Not difficult at all Not difficult at all       Information is confidential and restricted. Go to Review Flowsheets to unlock data.  She relates a lot of fatigue tiredness low energy in addition to this poor appetite nausea right upper quadrant discomfort She also relates she will be having a procedure coming up in the near future opened up the roof of the cyst in the liver to help this out She relates compliance with her other medicines She also states at times she feels like she just does not have enough energy to keep walking Her blood pressure was low she stopped taking the medicine upon our direction and so we can check her blood pressure today is mildly elevated  General-in no acute distress Eyes-no discharge Lungs-respiratory rate normal, CTA CV-no murmurs, mild bradycardia Extremities skin warm dry no edema Neuro grossly normal Behavior normal, alert   1. Bradycardia (Primary) I do not feel she needs to be on any medications for this currently she relates a lot of fatigue and tiredness but I believe that that is multifactorial - EKG 12-Lead  2. 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

## 2024-04-22 ENCOUNTER — Telehealth: Payer: Self-pay | Admitting: Family Medicine

## 2024-04-22 NOTE — Telephone Encounter (Signed)
 Hi Dorn  I saw this patient recently with complaints of fatigue feeling like she might pass out or get out with physical activity  We did complete a EKG, I did not do any additional blood work  I was able to look over her EKG it does show bradycardia but there is no pathologic issues to the rhythm She is not passing out She does have some T wave inversions in the lateral leads V3, V4, V5 which is different than her EKG from July but similar to her EKG from October  She is not having any chest tightness pressure or pain but states she just feels very weak and does not have much energy.  Unfortunately this patient is quite complex-so her symptoms may be multifactorial deconditioning, underlying health issues, stress  My main question Does she need any type of Myoview  or echo given she relates fatigue and shortness of breath with activity. If she needs referral back to your services I am more than willing to do that as well.  I appreciate your input regarding how to proceed forward  Thank you for your help-Makenize Messman

## 2024-04-23 ENCOUNTER — Other Ambulatory Visit: Payer: Self-pay

## 2024-05-03 ENCOUNTER — Other Ambulatory Visit: Payer: Self-pay | Admitting: Family Medicine

## 2024-05-03 ENCOUNTER — Telehealth: Payer: Self-pay | Admitting: Family Medicine

## 2024-05-03 ENCOUNTER — Ambulatory Visit (HOSPITAL_COMMUNITY): Admitting: Psychiatry

## 2024-05-03 MED ORDER — LIDOCAINE 5 % EX PTCH
1.0000 | MEDICATED_PATCH | CUTANEOUS | 0 refills | Status: DC
Start: 2024-05-03 — End: 2024-05-28

## 2024-05-03 NOTE — Telephone Encounter (Signed)
 Prescription was sent She utilizes this for pain associated with the cyst in her liver Certainly at times may even use it for her back pain is best find out [place] or may not be covered by insurance but prescription was sent anyways per her request

## 2024-05-03 NOTE — Telephone Encounter (Signed)
 Refill on lidocaine  5% topical patch   [facility]

## 2024-05-09 NOTE — Telephone Encounter (Signed)
Provider sent in medication.

## 2024-05-09 NOTE — Telephone Encounter (Signed)
 Please close provider has sent in prescription

## 2024-05-16 DIAGNOSIS — Z6841 Body Mass Index (BMI) 40.0 and over, adult: Secondary | ICD-10-CM | POA: Diagnosis not present

## 2024-05-16 DIAGNOSIS — E785 Hyperlipidemia, unspecified: Secondary | ICD-10-CM | POA: Diagnosis not present

## 2024-05-16 DIAGNOSIS — I10 Essential (primary) hypertension: Secondary | ICD-10-CM | POA: Diagnosis not present

## 2024-05-16 DIAGNOSIS — E1169 Type 2 diabetes mellitus with other specified complication: Secondary | ICD-10-CM | POA: Diagnosis not present

## 2024-05-16 DIAGNOSIS — R001 Bradycardia, unspecified: Secondary | ICD-10-CM | POA: Diagnosis not present

## 2024-05-16 DIAGNOSIS — K7689 Other specified diseases of liver: Secondary | ICD-10-CM | POA: Diagnosis not present

## 2024-05-17 ENCOUNTER — Ambulatory Visit (HOSPITAL_COMMUNITY): Admitting: Psychiatry

## 2024-05-23 DIAGNOSIS — E1169 Type 2 diabetes mellitus with other specified complication: Secondary | ICD-10-CM | POA: Diagnosis not present

## 2024-05-23 DIAGNOSIS — R001 Bradycardia, unspecified: Secondary | ICD-10-CM | POA: Diagnosis not present

## 2024-05-23 DIAGNOSIS — K7689 Other specified diseases of liver: Secondary | ICD-10-CM | POA: Diagnosis not present

## 2024-05-23 DIAGNOSIS — Z01810 Encounter for preprocedural cardiovascular examination: Secondary | ICD-10-CM | POA: Diagnosis not present

## 2024-05-23 DIAGNOSIS — I10 Essential (primary) hypertension: Secondary | ICD-10-CM | POA: Diagnosis not present

## 2024-05-23 DIAGNOSIS — E785 Hyperlipidemia, unspecified: Secondary | ICD-10-CM | POA: Diagnosis not present

## 2024-05-24 ENCOUNTER — Encounter: Payer: Self-pay | Admitting: Transplant Surgery

## 2024-05-24 ENCOUNTER — Telehealth: Payer: Self-pay | Admitting: Cardiology

## 2024-05-24 NOTE — Telephone Encounter (Signed)
 error

## 2024-05-24 NOTE — Telephone Encounter (Signed)
 Dr. Alvan,  Ms. Kuhlmann is requesting preoperative cardiac evaluation for laparoscopic fenestration of a hepatic cyst.  Procedure has not yet been scheduled.   She was seen by you in clinic last on 12/27/2023.  She remained stable from a cardiac standpoint.  Her PMH includes mild CAD, hyperlipidemia, hypertension, and she underwent cardiac catheterization in 2005 which showed no significant evidence of coronary artery disease.  EF was normal.  She did not tolerate aspirin .  Stress testing 12/17 showed no ischemia and low risk.  EF was 75%.   May her Plavix  be held prior to her procedure?  Thank you for your help.  Please direct your response to CVD IV preop pool.  Josefa HERO. Emre Stock NP-C     05/24/2024, 1:47 PM [facility] [address] [place], [place] [address] Office [phone]

## 2024-05-24 NOTE — Telephone Encounter (Signed)
   Pre-operative Risk Assessment    Patient Name: [name]  DOB: 06-02-1946 MRN: [id number]      Request for Surgical Clearance    Procedure:  laparoscopic fenestration of a hepatic cyst  Date of Surgery:  Clearance TBD                                 Surgeon:  Dr. Leanna Gore Surgeon's Group or Practice Name:  [facility] Phone number: [phone] Fax number:  [phone]   Type of Clearance Requested:   - Medical  - Pharmacy:  Hold Clopidogrel  (Plavix )     Type of Anesthesia:  Not Indicated   Additional requests/questions:    [name] [name] [name] [name]   05/24/2024, 1:03 PM

## 2024-05-26 ENCOUNTER — Other Ambulatory Visit: Payer: Self-pay | Admitting: Family Medicine

## 2024-05-28 NOTE — Telephone Encounter (Signed)
   Name: [name]  DOB: 1946/02/03  MRN: [id number]  Primary Cardiologist: Alvan Carrier, MD Last OV with Dr. Alvan on 12/27/23  Preoperative team, please contact this patient and set up a phone call appointment for further preoperative risk assessment. Please obtain consent and complete medication review. Thank you for your help.  I confirm that guidance regarding antiplatelet and oral anticoagulation therapy has been completed and, if necessary, noted below.  Per Dr. Alvan, [facility] to hold Plavix  as needed.  Patient may hold Plavix  for 5 days prior to procedure, please resume Plavix  as soon as safe to do so from a bleeding standpoint.  I also confirmed the patient resides in the state of [place] . As per [facility] Medical Board telemedicine laws, the patient must reside in the state in which the provider is licensed.  Jahnavi Muratore D Damel Querry, NP 05/28/2024, 9:55 AM [facility]

## 2024-05-28 NOTE — Telephone Encounter (Signed)
 S/w the pt about preop appt needed. Pt asked if she could be seen in the office instead. I said that is fine. Pt has been scheduled this Friday with Dr. Alvan 06/01/24 @ 9 am.    I will update all parties involved.

## 2024-06-01 ENCOUNTER — Ambulatory Visit: Attending: Cardiology | Admitting: Cardiology

## 2024-06-01 ENCOUNTER — Encounter: Payer: Self-pay | Admitting: Cardiology

## 2024-06-01 VITALS — BP 124/80 | HR 41 | Ht 64.0 in | Wt 251.2 lb

## 2024-06-01 DIAGNOSIS — R001 Bradycardia, unspecified: Secondary | ICD-10-CM | POA: Diagnosis not present

## 2024-06-01 DIAGNOSIS — I2510 Atherosclerotic heart disease of native coronary artery without angina pectoris: Secondary | ICD-10-CM

## 2024-06-01 DIAGNOSIS — Z87898 Personal history of other specified conditions: Secondary | ICD-10-CM | POA: Diagnosis not present

## 2024-06-01 DIAGNOSIS — I10 Essential (primary) hypertension: Secondary | ICD-10-CM | POA: Diagnosis not present

## 2024-06-01 DIAGNOSIS — E782 Mixed hyperlipidemia: Secondary | ICD-10-CM | POA: Diagnosis not present

## 2024-06-01 NOTE — Telephone Encounter (Signed)
   Patient Name: [name]  DOB: 12-22-1945 MRN: [id number]  Primary Cardiologist: Alvan Carrier, MD  Chart reviewed as part of pre-operative protocol coverage. Given past medical history and time since last visit, based on ACC/AHA guidelines, ZANYLA KLEBBA is at acceptable risk for the planned procedure without further cardiovascular testing.   Ok to proceed with surgery from cardiac standpoint. Can hold plavix  5 days prior, resume day after. If neccesary could wait additional time after procedure to resume   JINNY Alvan MD  The patient was advised that if she develops new symptoms prior to surgery to contact our office to arrange for a follow-up visit, and she verbalized understanding.  I will route this recommendation to the requesting party via Epic fax function and remove from pre-op pool.  Please call with questions.  Lamarr Satterfield, NP 06/01/2024, 9:22 AM

## 2024-06-01 NOTE — Progress Notes (Signed)
 Clinical Summary [name] is a 78 y.o.female seen today for follow up of the following medical problems.    1. Chest pain  - long history of chest pain - caths in 2005 and 2012 with just mild disease.  - nuclear stress 07/2016 without ischemia   she reports asked not to take aspirin  in the past by GI, has been on plavix  as alternative given mild CAD by prior cath and multiple risk factors    - stress test ordered by [facility] for clearance for liver cyst surgery  04/2024 [facility] stress PET: no ischemia  - no recent chest pain. Some fatigue with activities but denies specific SOB or DOE.        2. Hyperlipidemia - stomach troubles on lipitor, tolerating pravastatin .    - 10/2019 TC 119 TG 89 HDL 47 LDL 55 - 11/2023 TC 853 TG 97 HDL 53 LDL 75   3. HTN - compliant with meds   4. Chronic sinus Bradycardia - 07/2023 monitor: occasional PACs, 6 runs SVT longest 16 beats, frequent PVCs 5.1% burden. Isolated 3 sec pause early AM hours. Avg HR 63  - denies lightheadedness, no dizziness.      5. Liver cysts - followed by GI   6. LE edema - reports some ankle swelling, reports some abdominal distension - denies SOB/DOE.  - recent ankle surgery  7.Preoperative evaluation - considering laprascopic fenestration of hepatic cyst. - needs to hold plavix       SH: oldest son has CKD IV, pending dialysis. 50 yo sister she helps take care of.  Past Medical History:  Diagnosis Date   Blood clot of artery under arm (HCC) 2017   Bronchitis    Chest pain    a. mild CAD by cath in 2005 and 2012 b. low-risk NST's in 07/2016 and 05/2019   Cyst on liver   Depression    Diabetes mellitus    controlled by diet   Diverticulitis    DVT of axillary vein, acute right (HCC) 11/24/2012   Dx on Jan 28, 2012.     Dysrhythmia    Fibrocystic breast disease    Hepatic cyst    Hypertension    IBS (irritable bowel syndrome)    Impaired glucose tolerance    Menopause    Tachycardia       Allergies  Allergen Reactions   Atorvastatin     GI Issues   Ibuprofen      Do not take since she is on antiplatelet   Penicillins Hives and Other (See Comments)    Has patient had a PCN reaction causing immediate rash, facial/tongue/throat swelling, SOB or lightheadedness with hypotension: Yes Has patient had a PCN reaction causing severe rash involving mucus membranes or skin necrosis: No Has patient had a PCN reaction that required hospitalization No Has patient had a PCN reaction occurring within the last 10 years: No If all of the above answers are NO, then may proceed with Cephalosporin use.    Advair Diskus [Fluticasone-Salmeterol] Palpitations   Metformin  And Related Other (See Comments)    Reaction:  GI upset      Current Outpatient Medications  Medication Sig Dispense Refill   acetaminophen  (TYLENOL ) 500 MG tablet Take 1,000 mg by mouth every 6 (six) hours as needed for mild pain, moderate pain or headache.      amLODipine  (NORVASC ) 2.5 MG tablet Take 1 tablet (2.5 mg total) by mouth daily. 90 tablet 1   amLODipine  (NORVASC )  2.5 MG tablet Take 1 tablet (2.5 mg total) by mouth daily. 30 tablet 3   Blood Glucose Monitoring Suppl (PRODIGY AUTOCODE BLOOD GLUCOSE) w/Device KIT USE AS DIRECTED 1 kit 3   Cholecalciferol (VITAMIN D-3) 125 MCG (5000 UT) TABS Take 5,000 Units by mouth daily.     clopidogrel  (PLAVIX ) 75 MG tablet TAKE 1 TABLET EVERY DAY (NEED MD APPOINTMENT FOR REFILLS) 90 tablet 3   diclofenac  Sodium (VOLTAREN ) 1 % GEL Apply 2 g topically 4 (four) times daily. Rub into affected area of foot 2 to 4 times daily 100 g 2   HYDROcodone -acetaminophen  (NORCO/VICODIN) 5-325 MG tablet Take one tab po q 4-6 hours prn severe pain; drowsiness precautions 20 tablet 0   latanoprost (XALATAN) 0.005 % ophthalmic solution Place 1 drop into both eyes at bedtime.     lidocaine  (LIDODERM ) 5 % PLACE 1 PATCH ONTO THE SKIN DAILY. REMOVE AND DISCARD PATCH WITHIN 12 HOURS OR AS  DIRECTED BY MD 30 patch 11   LINZESS  290 MCG CAPS capsule TAKE 1 CAPSULE EVERY DAY BEFORE BREAKFAST 90 capsule 3   losartan  (COZAAR ) 50 MG tablet 1 qd 90 tablet 1   oxyCODONE  (OXY IR/ROXICODONE ) 5 MG immediate release tablet Take 5 mg by mouth every 4 (four) hours as needed.     pantoprazole  (PROTONIX ) 40 MG tablet Take 1 tablet (40 mg total) by mouth daily. 90 tablet 3   pravastatin  (PRAVACHOL ) 20 MG tablet TAKE 1 TABLET EVERY EVENING ( DOSE DECREASE ) 90 tablet 3   PRODIGY NO CODING BLOOD GLUC test strip TEST BLOOD SUGAR AS INSTRUCTED 100 strip 3   Prodigy Twist Top Lancets 28G MISC USE AS DIRECTED 100 each 3   sitaGLIPtin  (JANUVIA ) 50 MG tablet Take 1 tablet (50 mg total) by mouth daily. 90 tablet 3   No current facility-administered medications for this visit.     Past Surgical History:  Procedure Laterality Date   ABDOMINAL HYSTERECTOMY     BACK SURGERY     blood clot Right 2014   arm   BREAST SURGERY  right   cyst removed   CARDIAC CATHETERIZATION  2010   with stent   CAST APPLICATION  02/14/2012   Procedure: CAST APPLICATION;  Surgeon: Taft FORBES Minerva, MD;  Location: [facility];  Service: Orthopedics;  Laterality: Right;  procedure room    CHOLECYSTECTOMY     COLONOSCOPY  12/04/2004   NUR:Few small diverticula at sigmoid colon/Small cecal polyp ablated    COLONOSCOPY N/A 09/24/2014   SLF: small internal hemorrhoids   COLONOSCOPY N/A 02/15/2020   Procedure: COLONOSCOPY;  Surgeon: Shaaron Lamar HERO, MD;  Location: [facility];  Service: Endoscopy;  Laterality: N/A;  8:30am   COLONOSCOPY WITH PROPOFOL  N/A 01/17/2023   Procedure: COLONOSCOPY WITH PROPOFOL ;  Surgeon: Cindie Carlin POUR, DO;  Location: [facility];  Service: Endoscopy;  Laterality: N/A;  1:00 pm, ASA 3   COLONOSCOPY WITH PROPOFOL  N/A 10/10/2023   Procedure: COLONOSCOPY WITH PROPOFOL ;  Surgeon: Cindie Carlin POUR, DO;  Location: [facility];  Service: Endoscopy;  Laterality: N/A;  1:00 pm, asa 3    ESOPHAGOGASTRODUODENOSCOPY N/A 02/02/2016   Procedure: ESOPHAGOGASTRODUODENOSCOPY (EGD);  Surgeon: Margo LITTIE Haddock, MD;  Location: [facility];  Service: Endoscopy;  Laterality: N/A;  930    EXTERNAL FIXATION ANKLE FRACTURE     KNEE ARTHROCENTESIS  left   POLYPECTOMY  02/15/2020   Procedure: POLYPECTOMY;  Surgeon: Shaaron Lamar HERO, MD;  Location: [facility];  Service:  Endoscopy;;   POLYPECTOMY  01/17/2023   Procedure: POLYPECTOMY;  Surgeon: Cindie Carlin POUR, DO;  [facility]: [facility];  Service: Endoscopy;;   POLYPECTOMY  10/10/2023   Procedure: POLYPECTOMY;  Surgeon: Cindie Carlin POUR, DO;  Location: [facility];  Service: Endoscopy;;   TUBAL LIGATION       Allergies  Allergen Reactions   Atorvastatin     GI Issues   Ibuprofen      Do not take since she is on antiplatelet   Penicillins Hives and Other (See Comments)    Has patient had a PCN reaction causing immediate rash, facial/tongue/throat swelling, SOB or lightheadedness with hypotension: Yes Has patient had a PCN reaction causing severe rash involving mucus membranes or skin necrosis: No Has patient had a PCN reaction that required hospitalization No Has patient had a PCN reaction occurring within the last 10 years: No If all of the above answers are NO, then may proceed with Cephalosporin use.    Advair Diskus [Fluticasone-Salmeterol] Palpitations   Metformin  And Related Other (See Comments)    Reaction:  GI upset       Family History  Problem Relation Age of Onset   Stroke Mother    Heart attack Mother    Cancer Sister    Diabetes Sister    Seizures Brother    Diabetes Brother    Heart disease Brother    Kidney disease Son    Colon cancer Neg Hx      Social History Ms. Mccauley reports that she has never smoked. She has never used smokeless tobacco. Ms. Tashiro reports no history of alcohol use.     Physical Examination Today's Vitals   06/01/24 0840  BP: 124/80  Pulse: (!) 41  SpO2:  97%  Weight: 251 lb 3.2 oz (113.9 kg)  Height: 5' 4 (1.626 m)   Body mass index is 43.12 kg/m.  Gen: resting comfortably, no acute distress HEENT: no scleral icterus, pupils equal round and reactive, no palptable cervical adenopathy,  CV: regular, brady, no mrg, no jvd Resp: Clear to auscultation bilaterally GI: abdomen is soft, non-tender, non-distended, normal bowel sounds, no hepatosplenomegaly MSK: extremities are warm, no edema.  Skin: warm, no rash Neuro:  no focal deficits Psych: appropriate affect   Diagnostic Studies 07/2016 Nuclear stress test There was no ST segment deviation noted during stress. The study is normal. No ischemia or infarction. This is a low risk study. Nuclear stress EF: 75%.     05/2011 cath ANGIOGRAPHIC FINDINGS: 1. The left main coronary artery was a short segment and had no     disease. 2. The left anterior descending was a large vessel that coursed to the     apex and gave off a moderate-sized diagonal Samiyah Stupka.  The midportion     of the left anterior descending artery had a mild 20% stenosis.     The diagonal Shirline Kendle was free of any disease. 3. Circumflex artery is a moderate-sized vessel that gives off a     moderate-sized bifurcating obtuse marginal Toma Arts.  This vessel is     free of any disease. 4. The right coronary artery is a moderate-sized dominant vessel with     no evidence of disease. 5. Left ventricular angiogram was performed in the RAO projection and     it showed normal left ventricular systolic function with ejection     fraction of 65-70%.   IMPRESSION: 1. Mild nonobstructive coronary artery disease. 2. Normal left ventricular systolic  function. 3. Noncardiac chest pain.     06/2004 cath Left main coronary artery was normal.    The left anterior descending artery was normal.    Circumflex coronary artery is normal.    Right coronary artery was somewhat small but dominant.  It was normal.    Right  ventriculography:  The right ventriculography showed hyperdynamic LV  function, ejection fraction of 65%.  There was no gradient across the aortic  valve and no MR.  The aortic pressure was 129/66, LV pressure is 130/90.    IMPRESSION:  The patient has no evidence of significant coronary artery  disease on RAO ventriculogram.  The ascending aorta and descending thoracic  aorta looked fine with no evidence of dissection.  Her chest pain would  appear to be noncardiac in etiology.  She will be discharged later today to  follow up with her primary care M.D.    The patient tolerated the procedure well.    Assessment and Plan  1. Chest pain/mild CAD - long history, multiple negative workups in the past as outlined above - no recent symptoms - most recentlty had stress PET at [facility] for preop risk stratification that showed no ischemia -she reports asked not to take aspirin  in the past by GI, has been on plavix  as alternative given mild CAD by prior cath and multiple risk factors  - continue to monitor   2. Hyperlipidemia - did not tolerate high dose statin, tolerating pravastatin  - lipids at goal, continue current meds   3. HTN - bp at goal, continue current meds  4. Chronic sinus bradycardia - montor last year average HRs were in the 60 - seen by EP for sinus node dysfunction, remains asymptomatic - at this time ongoing monitoring   5.Preoperative evaluation - plans for liver cyst surgery - recent stress PET at [facility] was benign - ok to proceed with surgery from cardiac standpoint - can hold plavix  5 days prior, resume day after procedure. If neccesary could resume few days after procedure as well.    F/u 6 months   Dorn PHEBE Ross, M.D.

## 2024-06-01 NOTE — Telephone Encounter (Signed)
 Ok to proceed with surgery from cardiac standpoint. Can hold plavix  5 days prior, resume day after. If neccesary could wait additional time after procedure to resume  JINNY Ross MD

## 2024-06-01 NOTE — Patient Instructions (Signed)

## 2024-06-05 ENCOUNTER — Ambulatory Visit (HOSPITAL_COMMUNITY): Admitting: Psychiatry

## 2024-06-07 DIAGNOSIS — K639 Disease of intestine, unspecified: Secondary | ICD-10-CM | POA: Diagnosis not present

## 2024-06-07 DIAGNOSIS — K7689 Other specified diseases of liver: Secondary | ICD-10-CM | POA: Diagnosis not present

## 2024-06-07 NOTE — Telephone Encounter (Signed)
 Nurse had already sent in medication

## 2024-06-11 DIAGNOSIS — Z01818 Encounter for other preprocedural examination: Secondary | ICD-10-CM | POA: Diagnosis not present

## 2024-06-11 DIAGNOSIS — K7689 Other specified diseases of liver: Secondary | ICD-10-CM | POA: Diagnosis not present

## 2024-06-14 ENCOUNTER — Telehealth: Payer: Self-pay | Admitting: *Deleted

## 2024-06-14 NOTE — Anesthesia Preprocedure Evaluation (Signed)
 Procedure Information:  Date/Time: 06/15/24 0730   Procedures:      MARSUPIALIZATION CYST/ABSCESS LIVER (Midline)     LAPAROSCOPY, ABDOMEN, PERITONEUM, & OMENTUM, DIAGNOSTIC, W/WO  COLLECTION SPECIMEN(S) BY BRUSHING OR WASHING (Midline)   Anesthesia type: General   Diagnosis: Liver cyst [K76.89]   Pre-op diagnosis: Liver cyst [K76.89]   Location: [facility] OR 24 / OR [facility]   Surgeons: Meade Leanna Miller, MD; Trinidad Annitta Hasten, MD     Anesthesia Evaluation     No history of anesthetic complications   Airway  Mallampati: II Neck ROM: full Mouth Opening: normal   Dental - normal exam     Pulmonary   (-) COPD, asthma  Cardiovascular  Exercise tolerance: good (+) hypertension, CAD ,  (-) past MI   Neuro/Psych   (-) seizures, CVA  GI/Hepatic/Renal   (+) GERDComments: Hepatic cyst   Endo/Other   (+) diabetes mellitus, morbid obesity   Abdominal   OB/GYN      HEENT - negative ROS                      Anesthesia Plan  ASA 3   NPO Appropriate? Yes  Anesthetic:  General  Standard lines and monitors. Additional lines and monitors: Second PIV Type of induction: IV Airway: endotracheal tube                                        Difficult Airway: Evaluate airway in OR   Post Procedure Pain Management:IV analgesics and oral pain medication.    Anesthesia plan and risk discussed with patient; informed consent obtained.  Use of blood products discussed with patient, whom consented to blood products.    Patient has not received blood products within the last 30 days. Not Pregnant  Plan discussed with anesthesiologist and resident.    Relevant Problems  ANESTHESIA  (+) Essential hypertension  (+) GERD (gastroesophageal reflux disease)  (+) Liver cyst

## 2024-06-14 NOTE — Telephone Encounter (Signed)
 Copied from CRM #[id number]. Topic: Appointments - Scheduling Inquiry for Clinic >> Jun 14, 2024  1:22 PM Harlene ORN wrote: Reason for CRM:  Wanted to let the PCP that she is scheduled for the  liver surgery at 7:30 am tomorrow. Requesting to speak to [place].

## 2024-06-15 DIAGNOSIS — E669 Obesity, unspecified: Secondary | ICD-10-CM | POA: Diagnosis not present

## 2024-06-15 DIAGNOSIS — I2510 Atherosclerotic heart disease of native coronary artery without angina pectoris: Secondary | ICD-10-CM | POA: Diagnosis not present

## 2024-06-15 DIAGNOSIS — G8918 Other acute postprocedural pain: Secondary | ICD-10-CM | POA: Diagnosis not present

## 2024-06-15 DIAGNOSIS — K750 Abscess of liver: Secondary | ICD-10-CM | POA: Diagnosis not present

## 2024-06-15 DIAGNOSIS — E785 Hyperlipidemia, unspecified: Secondary | ICD-10-CM | POA: Diagnosis not present

## 2024-06-15 DIAGNOSIS — K7689 Other specified diseases of liver: Secondary | ICD-10-CM | POA: Diagnosis not present

## 2024-06-15 DIAGNOSIS — K219 Gastro-esophageal reflux disease without esophagitis: Secondary | ICD-10-CM | POA: Diagnosis not present

## 2024-06-15 DIAGNOSIS — E119 Type 2 diabetes mellitus without complications: Secondary | ICD-10-CM | POA: Diagnosis not present

## 2024-06-15 DIAGNOSIS — I10 Essential (primary) hypertension: Secondary | ICD-10-CM | POA: Diagnosis not present

## 2024-06-15 DIAGNOSIS — K660 Peritoneal adhesions (postprocedural) (postinfection): Secondary | ICD-10-CM | POA: Diagnosis not present

## 2024-06-15 DIAGNOSIS — Z6841 Body Mass Index (BMI) 40.0 and over, adult: Secondary | ICD-10-CM | POA: Diagnosis not present

## 2024-06-15 NOTE — Care Plan (Signed)
 A&O x4, VSS, on room air  No complaints of pain this shift  On clear liquids diet, tolerating well. No n/v this shift.  Ambulated to bathroom and back to bed to try to void but did not pass TOV since foley pulled this AM in OR. On call resident notified, no new orders at this time. Handoff given to night shift RN about monitoring UOP.  Bedside commode at bedside, no BM or passing flatus just yet.  Shift Summary Pain was managed with multiple medications in the [facility] department, resulting in a marked reduction in pain scores.  Pressure injury prevention and anti-embolism devices were maintained, with skin and wound assessments remaining stable.  Ambulation and self-feeding were achieved, supporting functional recovery.  Correctional insulin  was administered for elevated blood glucose levels.  Overall, the patient remained safe and free from falls, with consistent use of safety interventions.   Absence of Hospital-Acquired Illness or Injury: Skin and wound assessments for the surgical site and IV sites remained clean, dry, and intact throughout the shift, with no odor or margin changes noted; pressure injury prevention measures and anti-embolism devices were consistently utilized and maintained.   Optimal Functional Ability: Ambulation to the bathroom and back to bed was achieved, and hourly visual checks documented periods of wakefulness and rest in bed.   Improved Oral Intake: Oral intake increased over the shift, with small but progressive amounts consumed.   Optimal Pain Control and Function: Pain scores decreased from 10 to 0 after administration of HYDROmorphone , fentaNYL , and oxyCODONE  in the [facility] department, with the patient later noted asleep and comfortable.   Absence of Fall and Fall-Related Injury: Fall reduction interventions, including low bed position, nonskid footwear, and hourly checks, were maintained throughout the shift, with no fall events  documented.

## 2024-06-15 NOTE — Unmapped External Note (Signed)
 Operative Note (CSN: [id number])   Date of Surgery: 06/15/2024  Pre-op Diagnosis: Liver cyst [K76.89]  Post-op Diagnosis: same   [Note: Revisions to procedures should be made in chart - see Procedures tab.]  Midline - MARSUPIALIZATION CYST/ABSCESS LIVER Midline - LAPAROSCOPY, ABDOMEN, PERITONEUM, & OMENTUM, DIAGNOSTIC, W/WO COLLECTION SPECIMEN(S) BY BRUSHING OR WASHING  Performing Service: Transplant Surgeons and Role: Panel 1:    * Meade Leanna Miller, MD - Primary Panel 2:    * Trinidad Annitta Hasten, MD - Primary  Assistant: None  Anesthesia: General  Estimated Blood Loss: 2 mL  Complications: None  Specimens:  ID Type Source Tests Collected by Time Destination  1 : Cyst Wall Tissue Liver SURGICAL PATHOLOGY [facility] Meade Leanna Miller, MD 06/15/2024 0915     Findings: Uneventful laparoscopic entry of the abdomen  Procedure: Patient was taken to the operating room, was kept in a supine position. The anesthesia team administered general anesthesia. A Foley catheter was put in. She shaved and prepped and draped in a standard position.  We first made a small LUQ incision and introduced the Veress needle into the peritoneal cavity followed by a confirmatory saline drop test. The abdomen was then insufflated to . A 5mm port was placed on the supraumbilical region using optiview technique. The Veress entry site was visualized without injury and additional 5mm ports were placed in the subxhoid region, and two under the right costal margin. The case was then turned over to the transplant team for their portion.  Belvie JAYSON Daring, MD  Date: 06/15/2024  Time: 10:17 AM

## 2024-06-15 NOTE — Care Plan (Signed)
 Spoke with daughter at bedside. She will be available to take her mother home; hopefully by Sunday.

## 2024-06-15 NOTE — Anesthesia Procedure Notes (Signed)
 Peripheral Nerve Blocks  Date/Time: 06/15/2024 10:00 AM  Block Type:  Transversus Abdominus Plane (TAP) Patient Location:  OR  Indication for Block:  at surgeon's request, post-op pain management    Pain Location:  Abdominal Current Pain?:  No Pain Score:  0/10 Pre-Block Motor Deficit:  None Pre-Block Sensory Deficit:  None  Staff:    Performed by:  Resident   Anesthesiologist:  Roxana Toribio Charleston, MD   Resident/CRNA:  Acie Carolyn CROME, MD  Preanesthetic Checklist:    patient identified, IV checked, site marked, risks and benefits discussed, surgical consent, monitors and equipment checked, pre-op evaluation, timeout performed and anesthesia consent given    Peripheral Nerve Block:    Patient Position:  Supine, arms(s) at side   Prep:  Skin prepped with 2% chlorhexidine, cap and mask   Monitoring:  Cardiac monitor, continuous pulse oximetry, heart rate and non-invasive blood pressure   Laterality:  Bilateral     Local Infiltration:  Local infiltration with lidocaine   Needle:    Needle Type:  Combined needle/nerve stimulator, insulated, short-bevel and [place]   Needle Gauge:  21 G   Needle Length:  4 in   Block Technique:  Ultrasound guidance   Ultrasound guidance: ultrasound guided          Ultrasound image captured, confirmed and saved          Sterile gel and sterile probe covers were used    Assessment:    Injection Assessment:  Local visualized surrounding nerve on ultrasound, no paresthesia on injection and negative aspiration for heme   Paresthesia Pain:  None   Heart Rate Change: No block heart rate change     Slow Fractionated Injection: Block slow fractionated injection     Patient Tolerance: Well tolerated     Number of attempts:  1

## 2024-06-16 NOTE — Care Plan (Signed)
 Shift Summary Pt A+O x4, vital signs WDL except systolic blood pressure ranging from 170s-80s and diastolic 70-40s, MD made aware  Fall precautions remained in place, bed locked and in lowest position, pt worked with PT/OT today, walked with PT standby assist w/ walker. Pain at the surgical site was managed with PRN oxyCODONE  and non-pharmacologic interventions, with patient reporting improvement and declining further intervention by afternoon.  Pt tolerating regular diet with no carbonation w no c/o n/v  Pt voiding spontaeously via bedside commode, has not passed BM, reported feeling bloated and requested PRN laxatives early this evening.  Patient participated in PT and therapeutic activities, reporting pain as tolerable and modifying activity as needed. Wounds C/D/I, JP draining light yellow/orangish bile drainage, JP dressing site C/D/I Overall, patient remained engaged in care and interventions, with stable skin and wound assessments and no new hospital-acquired complications noted during the shift. Other Shift Notes:  -Pt received bed bath today   Absence of Hospital-Acquired Illness or Injury: No new hospital-acquired injuries were documented, and skin remained intact where visualized throughout the shift; frequent repositioning and use of pressure reduction devices were maintained. IV sites and surgical lap sites were clean, dry, and intact with no interventions needed.  Optimal Comfort and Wellbeing: Comfort interventions included frequent repositioning, pressure reduction, and pain management; patient reported feeling better by afternoon and was agreeable to therapeutic activities and PT.  Absence of Infection Signs and Symptoms: Temperature remained within normal range and wound/IV sites were clean and dry; CBC and morphology review noted abnormalities, but no conclusions can be drawn from these results alone.  Optimal Pain Control and Function: Pain at the surgical site fluctuated, with  patient reporting improvement after interventions and rating pain as 2/10 at rest and 6/10 after ambulation; oxyCODONE  was administered PRN and patient declined further intervention, stating pain was manageable.  Absence of Fall and Fall-Related Injury: Fall reduction program and safety interventions were consistently maintained, including use of nonskid footwear, low bed, and assistive devices; no falls or injuries occurred during the shift.

## 2024-06-16 NOTE — Consults (Signed)
 PHYSICAL THERAPY Evaluation (06/16/24 1425)      Patient Name:  [name]      Medical Record Number: [id number]  Date of Birth: Dec 05, 1945 Sex: Female      Post-Discharge Physical Therapy Recommendations: PT Post Acute Discharge Recommendations: 3x weekly  Equipment Recommendation PT DME Recommendations: Rolling walker        Treatment Diagnosis: Abnormalities of gait and mobility, Unsteadiness on feet (pain)     ASSESSMENT Problem List: Impaired balance, Pain, Decreased endurance, Decreased mobility, Gait deviation, Fall risk    Assessment : Per chart review, [name] is a 78 y.o. 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 Patient is seen status post laparoscopic fenestration of the liver cyst with excision of the wall/marsupialization on 10/17. Patient reports prior level of function independent using no assistive device. Patient performed all functional mobility using rolling walker with SBA. Patient with no  instability or loss of balance noted. Patient hemodynamically stable and asymptomatic on room air. Patient presents to PT below her self-reported functional baseline, primarily limited by pain. Based on personal factors, review of body system, and current clinical presentation, patient is considered a moderate complexity evaluation. Patient would continue to benefit from skilled PT services while inpatient to address above impairments and progress functional mobility as tolerated for return to prior level of function. Recommend 3x post-acute discharge at this time to maximize independence with functional mobility. Patient with no questions or concerns regarding discharge home.    Today's Interventions: PT EVAL. Pt  Edu: PT role/POC, vitals monitoring, pursed lip breathing for pain management, discharge recommendations, rehab mobility team, importance of continued mobility while inpatient to prevent deconditioning; Ther Act: graded functional mobility to include bed mobility, sit<>stand tranfers, and ambulation   Activity Tolerance: Tolerated treatment well    PLAN Planned Frequency of Treatment: Plan of Care Initiated: 06/16/24 1-2x per day Weekly Frequency: 3-4 days per week Planned Treatment Duration: 06/30/24   Planned Interventions: Education (Patient/Family/Caregiver), Gait training, Home exercise program, Neuromuscular re-education, Self-care / Home Management training, Therapeutic Exercise, Therapeutic Activity   Goals:  Patient and Family Goals: Walk   SHORT GOAL #1: Patient will perform all functional transfers with mod I, LRAD              Time Frame : 2 weeks SHORT GOAL #2: Patient will ambulate 345ft with mod I, LRAD             Time Frame : 2 weeks SHORT GOAL #3: Patient will ascend/descend 2 steps with modified independence using bilateral handrails             Time Frame : 2 weeks  Long Term Goal #1: Patient will score 24/24 on AMPAC for return to prior level of function Time Frame: 4 weeks   Prognosis:  Good Positive Indicators: PLOF, age, motivation Barriers to Discharge: Pain   SUBJECTIVE Communication Preference: Verbal    Patient reports: RN cleared patient for PT. Patient found sitting in bedside chair, agreeable to session. No visitors present in the room. Pain Comments: Patient endorsing pain in abdomen at incision site, rating 2/10 at rest and 6/10 after ambulation. RN notified and aware. Modified activity per patient tolerance.  Prior Functional Status: Patient reports prior level of function independent using no  assistive device. Patient lives at home with her son who goes to dialysis and recently had a back surgery one month ago. Patient states that her daughter  lives close by and will be stopping in to check on her both before and after work. Patient drives and is retired. Patient denies any recent falls. Patient enjoys singing in the choir at church. Living Situation Living Environment: House Lives With: Son Home Living: One level home, Stairs to enter with rails, Walk-in shower, Comfort height commode seat, Grab bars in shower, Shower chair with back Rail placement (outside): Bilateral rails in reach Number of Stairs to Enter (outside): 1 Caregiver Identified?: Yes (Children) Caregiver Availability: 24 hours (Son lives with that patient (home most of the time); Daughter lives close by (works during the day)) Caregiver Ability: Supervision Caregiver Identified?: Yes (Children)  Equipment available at home: Straight cane, Reacher (shower chair with back; toilet tongs)     Past Medical History[1]        Social History   Tobacco Use  . Smoking status: Never  . Smokeless tobacco: Not on file  Substance Use Topics  . Alcohol use: Not on file     Past Surgical History[2]         Family History[3]   Allergies: Amoxicillin, Norvasc  [amlodipine ], Fluticasone propion-salmeterol, and Penicillins     Objective Findings Precautions / Restrictions Precautions: Falls precautions, Other precautions (post op abdominal) Weight Bearing Status: Non-applicable Required Braces or Orthoses: Non-applicable   Medical Tests / Procedures: Reviewed in patient chart prior to session. Equipment / Environment: Vascular access (PIV, TLC, Port-a-cath, PICC), JP drain(s)   Vitals/Orthostatics : VSS throughout session on room air. Patient denies any dizziness or shortness of breath with functional mobility. NAD.   Cognition: WFL Orientation: Oriented x4 Visual/Perception: Wears Glasses/Contacts all the time (Patient denies any changes in vision to include blurry vision or double vision. 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 Patient denies any numbness or tingling on date of evaluation.) Posture: WFL  Sitting Balance comments: Patient able to statically sit edge of bed with no back or upper extremity support with feet flat on the ground with supervision for safety. Patient with no instability or loss of balance noted.  Standing Balance comments: Patient able to statically stand using rolling walker and SBA for safety. Patient with no instability or loss of balance noted.    Bed Mobility comments: Patient found sitting in bedside chair upon therapist entering the room. Patient performed sit to supine with head of bed slightly elevated without using bedrails with increased time secondary to pain and SBA for safety. Therapist educated patient on log roll for comfort, however, patient stating that her hurts and would prefer to pivot into bed.   Transfer comments: Patient performed sit<>stand from low bedside chair height x3 using rolling walker and SBA for safety. Patient demonstrating good eccentric control with stand to sit transfer.    Skilled Treatment Performed: Patient ambulated approximately 134ft using rolling walker and SBA for safety. Patient demonstrated step through gait pattern with decreased step length bilaterally and decreased gait speed. Patient with  no instability or loss of balance noted. Patient endorsing increased pain and mild fatigue after short bout of ambulation.   Stairs: Not tested.    Wheelchair Mobility: Not applicable.   Endurance: Patient with decreased endurance below her functional  baseline.  Patient at end of session: All needs in reach, In bed, Lines intact, Notified Nurse  Physical Therapy Session Duration PT Individual [mins]: 40    AM-PAC-6 click Help currently need turning over In bed?: A Little - Minimal/Contact Guard Assist/Supervision Help currently needed sitting down/standing up from chair with arms? : A Little - Minimal/Contact Guard Assist/Supervision Help currently needed moving from supine to sitting on edge of bed?: A Little - Minimal/Contact Guard Assist/Supervision Help currently needed moving to and from bed from wheelchair?: A Little - Minimal/Contact Guard Assist/Supervision Help currently needed walking in a hospital room?: A Little - Minimal/Contact Guard Assist/Supervision Help currently needed climbing 3-5 steps with railing?: A Little - Minimal/Contact Guard Assist/Supervision  Basic Mobility Score 6 click: 18  6 click Score (in points): % of Functional Impairment, Limitation, Restriction 6: 100% impaired, limited, restricted 7-8: At least 80%, but less than 100% impaired, limited restricted 9-13: At least 60%, but less than 80% impaired, limited restricted 14-19: At least 40%, but less than 60% impaired, limited restricted 20-22: At least 20%, but less than 40% impaired, limited restricted 23: At least 1%, but less than 20% impaired, limited restricted 24: 0% impaired, limited restricted  'AM-PAC' forms are Copyright protected by The Trustees of [facility]     I attest that I have reviewed the above information. Signed: Harlene Macario Drones, PT Filed 06/16/2024        [1] Past Medical History: Diagnosis Date  . Clotting disorder (HHS-HCC) DVT  . Diabetes mellitus (CMS-HCC)   . Hyperlipidemia associated with type 2 diabetes mellitus (CMS-HCC) 06/06/2018  . Hypertension   . Liver cyst   . Obesity (BMI 30-39.9)   [2] Past Surgical History: Procedure Laterality Date  . CARDIAC CATHETERIZATION  5 or 6 years ago per  patient  [3] History reviewed. No pertinent family history.

## 2024-06-16 NOTE — Care Plan (Signed)
 Shift Summary BP was elevated early in the shift, with MD notified and antihypertensive administered, followed by a downward trend in BP readings.  Pain was managed with PRN oxyCODONE , and the patient was later observed sleeping after intervention.  Fall prevention and safety interventions were maintained without incident.  Family visited during the shift, and anti-embolism devices remained in use.  The patient required maximum assistance for mobility and remained stable with no new hospital-acquired conditions documented. Patient passed trial of void with consistent good volume.  Absence of Hospital-Acquired Illness or Injury: No new hospital-acquired conditions were documented, and anti-embolism devices remained in use and on throughout the shift; clindamycin  was stopped shortly after administration, and heparin was given as ordered.  Optimal Comfort and Wellbeing: Acute surgical pain was reported as intermittent and aching, with a pain score of 7 at 1:25 AM; oxyCODONE  was administered PRN, and the patient was later noted to be sleeping after intervention.  Readiness for Transition of Care: Unplanned readmission score increased slightly during the shift, and maximum assistance continued to be required for mobility; no changes in cognition or psychosocial status were noted.  Rounds/Family Conference: Family/significant other visited during the shift.  Absence of Fall and Fall-Related Injury: Fall prevention interventions, including nonskid footwear, frequent toileting, hourly checks, and a fall reduction program, were consistently maintained throughout the shift; no falls or injuries were documented.

## 2024-06-17 NOTE — Nursing Note (Signed)
 [facility] Care Referral Clinical Care Management phone [phone] fax [phone]  Demographics Patient Name: [name] Date of Birth: Dec 04, 1945 Gender: Female [facility] Medical Record #: [id number] Billing Address: [address] [place] [address] Destination Address: Mailing Address:  [address] [place] [address]  Anticipated Discharge Date: 06/17/2024 Medicare homebound criteria met? Yes Known concerns about discharge environment? No Right to Choice document & provider list(s) provided and discussed with patient/family? Yes Teachable caregiver identified: Caregiver: Extended Emergency Contact Information Primary Emergency Contact: Corbett,Felicia Home Phone: [phone] Relation: Daughter Secondary Emergency Contact: Ramsey,Loretta Home Phone: [phone] Relation: Relative Additional Information: None Language Barrier (if yes, identify language): No  This Document is a Referral Only Additional information, including specific home care orders and instructions, will be provided in the electronic discharge summary.   Clinical Condition []  See attached medical records Admitting Diagnosis:  Liver cyst [K76.89] Past Medical History:   has a past medical history of Clotting disorder (HHS-HCC) (DVT), Diabetes mellitus (CMS-HCC), Hyperlipidemia associated with type 2 diabetes mellitus (CMS-HCC) (06/06/2018), Hypertension, Liver cyst, and Obesity (BMI 30-39.9). Past Surgical History:   has a past surgical history that includes Cardiac catheterization (5 or 6 years ago per patient).   Name of MD signing discharge orders and contact info: Chirag GORMAN Gore Primary Care Physician: Alphonsa Glendia LABOR  Services Requested [x]  New Referral []  Resumption of Care  []  This is patient at risk for readmission  Physical Therapy: Please evaluate and treat. NOTE: Weightbearing: full weightbearing Occupational  Therapy: Please evaluate and treat  Additional Order Information:   None  Requested Start of Care [facility]):  06/19/2024  CCM Case Manager to electronically sign referral when complete.  Allena Duster, RN

## 2024-06-17 NOTE — Consults (Signed)
 Care Management Initial Transition Planning Assessment            General Care Manager / Social Worker assessed the patient by : Telephone conversation with patient Orientation Level: Oriented X4 Functional level prior to admission: Partially Assisted (shower chair with back) Who provides care at home?: N/A (Patient is the caregiver for her 76 yo son on HD) Reason for referral: Discharge Planning  Type of Residence: Mailing Address:  [address] [place] [address] Contacts: Accompanied by: Family member Patient Phone Number: [phone]       Medical Provider(s): Alphonsa Hamilton A Reason for Admission: Admitting Diagnosis:  Liver cyst [K76.89] Past Medical History:   has a past medical history of Clotting disorder (HHS-HCC) (DVT), Diabetes mellitus (CMS-HCC), Hyperlipidemia associated with type 2 diabetes mellitus (CMS-HCC) (06/06/2018), Hypertension, Liver cyst, and Obesity (BMI 30-39.9). Past Surgical History:   has a past surgical history that includes Cardiac catheterization (5 or 6 years ago per patient).  Previous admit date: 02/14/2024  Primary Insurance- Payor: HUMANA MEDICARE ADV / Plan: HUMANA GOLD PLUS HMO / Product Type: *No Product type* /  Secondary Insurance - None Prescription Coverage - Yes Preferred Pharmacy - [facility] - [place], [place] - [address] [facility] - [place], [place] - [address]  Transportation home: Private vehicle  Contact/Decision Maker Extended Emergency Contact Information Primary Emergency Contact: Corbett,Felicia Home Phone: [phone] Relation: Daughter Secondary Emergency Contact: Ramsey,Loretta Home Phone: [phone] Relation: Relative  Legal Next of Kin / Guardian / POA / Advance Directives    HCDM (patient stated preference): Corbett,Felicia - Daughter - [phone]  Advance Directive  (Medical Treatment) Does patient have an advance directive covering medical treatment?: Patient does not have advance directive covering medical treatment.  Health Care Decision Maker [HCDM] (Medical & Mental Health Treatment) Healthcare Decision Maker: HCDM documented in the HCDM/Contact Info section.  Readmission Information  Have you been hospitalized in the last 30 days?: No  Patient Information Lives with: Children, Other (Comment) (daughter Cruz resides 5 minutes away and provides support)  Type of Residence: Private residence   Location/Detail: [facility] 1 step for entry witl bilateral rails  Responsibilities/Dependents at home?: Yes (Describe) (20 yo son on HD)  Home Care services in place prior to admission?: No  Equipment Currently Used at Home: shower chair   Currently receiving outpatient dialysis?: No   Financial Information   Need for financial assistance?: No   Social Drivers of Health Social Drivers of Health   Food Insecurity: No Food Insecurity (02/15/2024)   Hunger Vital Sign   . Worried About Programme researcher, broadcasting/film/video in the Last Year: Never true   . Ran Out of Food in the Last Year: Never true  Tobacco Use: Unknown (06/16/2024)   Patient History   . Smoking Tobacco Use: Never   . Smokeless Tobacco Use: Unknown   . Passive Exposure: Not on file  Transportation Needs: No Transportation Needs (02/15/2024)   PRAPARE - Transportation   . Lack of Transportation (Medical): No   . Lack of Transportation (Non-Medical): No  Alcohol Use: Not on file  Housing: Low Risk  (02/15/2024)   Housing   . Within the past 12 months, have you ever stayed: outside, in a car, in a tent, in an overnight shelter, or temporarily in someone else's home (i.e. couch-surfing)?: No   . Are you worried about losing your housing?: No  Physical Activity: Insufficiently Active (06/02/2021)   Received from [facility]   Exercise Vital Sign   . On average, how many days per week do you engage in  moderate to strenuous exercise (like a brisk walk)?: 2 days   . On average, how many minutes do you engage in exercise at this level?: 30 min  Utilities: Not At Risk (04/04/2023)   Received from [facility] Utilities   . Threatened with loss of utilities: No  Stress: No Stress Concern Present (06/02/2021)   Received from [facility] - Occupational Stress Questionnaire   . Feeling of Stress : Not at all  Interpersonal Safety: Patient Unable To Answer (05/16/2024)   Interpersonal Safety   . Unsafe Where You Currently Live: Patient unable to answer   . Physically Hurt by Anyone: Patient unable to answer   . Abused by Anyone: Patient unable to answer  Substance Use: Not on file (07/10/2023)  Intimate Partner Violence: Not At Risk (04/17/2024)   Humiliation, Afraid, Rape, and Kick questionnaire   . Fear of Current or Ex-Partner: No   . Emotionally Abused: No   . Physically Abused: No   . Sexually Abused: No  Social Connections: Moderately Integrated (06/02/2021)   Received from [facility]   Social Connection and Isolation Panel   . In a typical week, how many times do you talk on the phone with family, friends, or neighbors?: More than three times a week   . How often do you get together with friends or relatives?: More than three times a week   . How often do you attend church or religious services?: More than 4 times per year   . Do you belong to any clubs or organizations such as church groups, unions, fraternal or athletic groups, or school groups?: Yes   . How often do you attend meetings of the clubs or organizations you belong to?: More than 4 times per year   . Are you married, widowed, divorced, separated, never married, or living with a partner?: Widowed  Physicist, medical Strain: Low Risk  (02/15/2024)   Overall Financial Resource Strain (CARDIA)   . Difficulty of Paying Living Expenses: Not very hard  Health Literacy: Not on file   Internet Connectivity: Not on file   Complex Discharge Information  Is patient identified as a difficult/complex discharge?: No  Discharge Needs Assessment Concerns to be Addressed: coping/stress  Clinical Risk Factors: > 65  Barriers to taking medications: No  Prior overnight hospital stay or ED visit in last 90 days: No  Equipment Needed After Discharge: walker, rolling  Discharge Facility/Level of Care Needs:  Home with home health, and RW.  Readmission Risk of Unplanned Readmission Score: UNPLANNED READMISSION SCORE: 10.29% Predictive Model Details        10% (Low)  Factor Value   Calculated 06/17/2024 08:04 34% Number of active inpatient medication orders 30   [facility] Risk of Unplanned Readmission Model 15% ECG/EKG order present in last 6 months    11% Imaging order present in last 6 months    10% Age 28    9% Number of ED visits in last six months 1    8% Number of hospitalizations in last year 1    6% Charlson Comorbidity Index 3    3% Current length of stay 2.092 days    3% Future appointment scheduled    2% Active ulcer inpatient medication order present  Readmitted Within the Last 30 Days? (No if blank)  Patient at risk for readmission?: No  Discharge Plan Screen findings are: Discharge planning needs identified or anticipated (Comment).  Expected Discharge Date: 06/17/2024  Expected Transfer from Critical Care: N/A   Quality data for continuing care services shared with patient and/or representative?: Yes Patient and/or family were provided with choice of facilities / services that are available and appropriate to meet post hospital care needs?: Yes  List choices in order highest to lowest preferred, if applicable. : [facility]  Initial Assessment complete?: Yes

## 2024-06-17 NOTE — Care Plan (Signed)
 Shift Summary - VSS, Ax04 - pt's pain managed with scheduled and prn pain medication. - Adequate UOP during shift; 1 BM during shift. - Pt's BG ranged from 125-134 during shift; no correctional insulin  given. - AVS printed and discharge instructions explained to pt and family member prior to discharge. Meds delivered to bedside. - PIVS removed by RN prior to discharge. - Transport placed for pt discharge to lobby. OxyCODONE  was administered PRN for increased pain, followed by methocarbamol  later in the shift.  Lactated Ringers  infusion was stopped as ordered.  Pain score increased during the shift, prompting medication administration.  Oral intake and meal consumption decreased by the end of the shift.  Patient remained safe and free from falls, with all safety interventions maintained.   Absence of Infection Signs and Symptoms: Temperature remained stable and within normal limits, surgical wound and IV sites were consistently clean, dry, and intact, and no odor was noted at the incision site throughout the shift. Drain output and appearance remained unchanged, and no emesis occurred.   Improved Oral Intake: Oral intake was highest early in the shift but decreased later, with a notable drop in percent of meals eaten by the end of the shift. Patient maintained ability to feed self throughout.   Optimal Pain Control and Function: Pain score increased from 3 to 8 during the shift, and oxyCODONE  and methocarbamol  were administered for pain management. Patient declined intervention earlier in the shift.   Optimal Wound Healing: Surgical incision remained clean, dry, and open to air with attached margins and no odor; topical skin adhesive was intact and no dressing was required. Drain site and IV sites were also clean, dry, and intact.   Absence of Fall and Fall-Related Injury: Fall reduction program and safety interventions were maintained, nonskid footwear was consistently used, and hourly visual  checks confirmed patient was awake and in bed or chair. No falls or injuries occurred.

## 2024-06-18 ENCOUNTER — Telehealth: Payer: Self-pay | Admitting: *Deleted

## 2024-06-19 DIAGNOSIS — I2510 Atherosclerotic heart disease of native coronary artery without angina pectoris: Secondary | ICD-10-CM | POA: Diagnosis not present

## 2024-06-19 DIAGNOSIS — E1169 Type 2 diabetes mellitus with other specified complication: Secondary | ICD-10-CM | POA: Diagnosis not present

## 2024-06-19 DIAGNOSIS — K7689 Other specified diseases of liver: Secondary | ICD-10-CM | POA: Diagnosis not present

## 2024-06-19 DIAGNOSIS — K750 Abscess of liver: Secondary | ICD-10-CM | POA: Diagnosis not present

## 2024-06-19 DIAGNOSIS — E785 Hyperlipidemia, unspecified: Secondary | ICD-10-CM | POA: Diagnosis not present

## 2024-06-19 DIAGNOSIS — E1140 Type 2 diabetes mellitus with diabetic neuropathy, unspecified: Secondary | ICD-10-CM | POA: Diagnosis not present

## 2024-06-19 DIAGNOSIS — I10 Essential (primary) hypertension: Secondary | ICD-10-CM | POA: Diagnosis not present

## 2024-06-19 DIAGNOSIS — K760 Fatty (change of) liver, not elsewhere classified: Secondary | ICD-10-CM | POA: Diagnosis not present

## 2024-06-19 DIAGNOSIS — K219 Gastro-esophageal reflux disease without esophagitis: Secondary | ICD-10-CM | POA: Diagnosis not present

## 2024-06-19 NOTE — Transitions of Care (Post Inpatient/ED Visit) (Signed)
 06/19/2024  Name: [name] MRN: [id number] DOB: 1946/08/23  Today's TOC FU Call Status: Today's TOC FU Call Status:: Successful TOC FU Call Completed TOC FU Call Complete Date: 06/18/24 Patient's Name and Date of Birth confirmed.  Transition Care Management Follow-up Telephone Call Date of Discharge: 06/17/24 Discharge Facility: Other [facility]) Name of Other (Non[facility]) Discharge Facility: [facility] Type of Discharge: Inpatient Admission Primary Inpatient Discharge Diagnosis:: Liver cyst How have you been since you were released from the hospital?: Better Any questions or concerns?: Yes Patient Questions/Concerns:: Questions regarding f/u appt with PCP Patient Questions/Concerns Addressed: Other: (care guide scheduled appt)  Items Reviewed: Did you receive and understand the discharge instructions provided?: Yes Medications obtained,verified, and reconciled?: Yes (Medications Reviewed) Any new allergies since your discharge?: No Dietary orders reviewed?: No Do you have support at home?: Yes People in Home [RPT]: child(ren), adult Name of Support/Comfort Primary Source: Son and daughter felicia  Medications Reviewed Today: Medications Reviewed Today     Reviewed by Kennieth Cathlean DEL, RN (Case Manager) on 06/19/24 at 1627  Med List Status: <None>   Medication Order Taking? Sig Documenting Provider Last Dose Status Informant  acetaminophen  (TYLENOL ) 500 MG tablet [id number] Yes Take 1,000 mg by mouth every 6 (six) hours as needed for mild pain, moderate pain or headache.  [provider]  Active Self, Pharmacy Records  amLODipine  (NORVASC ) 2.5 MG tablet [id number] Yes Take 1 tablet (2.5 mg total) by mouth daily. Alphonsa Glendia LABOR, MD  Active   Blood Glucose Monitoring Suppl (PRODIGY AUTOCODE BLOOD GLUCOSE) w/Device KIT [id number] Yes USE AS DIRECTED Alphonsa Glendia LABOR, MD  Active   Cholecalciferol (VITAMIN D-3) 125 MCG (5000 UT) TABS [id number]  Yes Take 5,000 Units by mouth daily. [provider]  Active   clopidogrel  (PLAVIX ) 75 MG tablet [id number] Yes TAKE 1 TABLET EVERY DAY (NEED MD APPOINTMENT FOR REFILLS) Alvan Dorn FALCON, MD  Active   diclofenac  Sodium (VOLTAREN ) 1 % GEL [id number] Yes Apply 2 g topically 4 (four) times daily. Rub into affected area of foot 2 to 4 times daily Gershon Donnice SAUNDERS, DPM  Active Self, Pharmacy Records  HYDROcodone -acetaminophen  (NORCO/VICODIN) 5-325 MG tablet [id number]  Take one tab po q 4-6 hours prn severe pain; drowsiness precautions  Patient not taking: Reported on 06/19/2024   Hoskins, Carolyn C, NP  Active   latanoprost (XALATAN) 0.005 % ophthalmic solution [id number] Yes Place 1 drop into both eyes at bedtime. [provider]  Active Self, Pharmacy Records  lidocaine  (LIDODERM ) 5 % [id number] Yes PLACE 1 PATCH ONTO THE SKIN DAILY. REMOVE AND DISCARD PATCH WITHIN 12 HOURS OR AS DIRECTED BY MD Alphonsa Glendia LABOR, MD  Active   LINZESS  290 MCG CAPS capsule [id number] Yes TAKE 1 CAPSULE EVERY DAY BEFORE BREAKFAST Ezzard Sonny GORMAN, PA-C  Active   oxyCODONE  (OXY IR/ROXICODONE ) 5 MG immediate release tablet [id number] Yes Take 5 mg by mouth every 4 (four) hours as needed. [provider]  Active   pantoprazole  (PROTONIX ) 40 MG tablet [id number] Yes Take 1 tablet (40 mg total) by mouth daily. Alphonsa Glendia LABOR, MD  Active   pravastatin  (PRAVACHOL ) 20 MG tablet [id number] Yes TAKE 1 TABLET EVERY EVENING ( DOSE DECREASE ) Alphonsa Glendia LABOR, MD  Active   PRODIGY NO CODING BLOOD GLUC test strip [id number] Yes TEST BLOOD SUGAR AS INSTRUCTED Alphonsa Glendia LABOR, MD  Active   Prodigy Twist Top Lancets 28G MISC [id number] Yes USE AS DIRECTED Alphonsa Glendia LABOR, MD  Active   sitaGLIPtin  (JANUVIA ) 50 MG tablet [id number] Yes Take 1 tablet (50 mg total) by mouth daily. Alphonsa Glendia LABOR, MD  Active             Home Care and Equipment/Supplies: Were Home Health Services Ordered?: Yes Name of Home Health  Agency:: [facility] home care referral Has Agency set up a time to come to your home?: No EMR reviewed for Home Health Orders: Orders present/patient has not received call (refer to CM for follow-up) (they have texted patient but have not set up time they are coming) Any new equipment or medical supplies ordered?: Yes Name of Medical supply agency?: [facility] home care referral Were you able to get the equipment/medical supplies?: Yes (walker) Do you have any questions related to the use of the equipment/supplies?: No  Functional Questionnaire: Do you need assistance with bathing/showering or dressing?: Yes Do you need assistance with meal preparation?: Yes Do you need assistance with eating?: No Do you have difficulty maintaining continence: No Do you have difficulty managing or taking your medications?: No  Follow up appointments reviewed: PCP Follow-up appointment confirmed?: Yes Date of PCP follow-up appointment?: 06/28/24 Follow-up Provider: Dr Glendia [facility] Follow-up appointment confirmed?: Yes Date of Specialist follow-up appointment?: 06/27/24 Follow-Up Specialty Provider:: Dr Meade Do you need transportation to your follow-up appointment?: No Do you understand care options if your condition(s) worsen?: Yes-patient verbalized understanding  SDOH Interventions Today    Flowsheet Row Most Recent Value  SDOH Interventions   Food Insecurity Interventions Intervention Not Indicated  Housing Interventions Intervention Not Indicated  Transportation Interventions Intervention Not Indicated  Utilities Interventions Intervention Not Indicated    Goals Addressed             This Visit's Progress    VBCI Transitions of Care (TOC) Care Plan       Problems:  Recent Hospitalization for treatment of Liver cyst Knowledge Deficit Related to Liver cyst  Goal:  Over the next 30 days, the patient will not experience hospital readmission  Interventions:  Transitions of  Care: Doctor Visits  - discussed the importance of doctor visits Referral to Longitudinal Nurse Case Manager for Ongoing follow-up  Patient Self Care Activities:  Attend all scheduled provider appointments Call pharmacy for medication refills 3-7 days in advance of running out of medications Call provider office for new concerns or questions  Notify RN Care Manager of [facility] call rescheduling needs Participate in Transition of Care Program/Attend TOC scheduled calls Take medications as prescribed    Plan:  An initial telephone outreach has been scheduled for: [id number] Next PCP appointment scheduled for: [id number] Telephone follow up appointment with care management team member scheduled for: Mliss Creed [id number] 1:00       Discussed and offered 30 day TOC program.  Patient  accepted.  The patient has been provided with contact information for the care management team and has been advised to call with any health -related questions or concerns.  The patient verbalized understanding with current plan of care.  The patient is directed to their insurance card regarding availability of benefits coverage   Cathlean Headland BSN RN [facility] Health Care Management Coordinator [email or web address] Direct Dial: [phone]  Fax: [phone] Website: [email or web address]

## 2024-06-20 ENCOUNTER — Telehealth: Payer: Self-pay

## 2024-06-20 DIAGNOSIS — G8929 Other chronic pain: Secondary | ICD-10-CM

## 2024-06-20 NOTE — Telephone Encounter (Signed)
 Patient wants this through [place] and also wants it delivered to her home.

## 2024-06-20 NOTE — Telephone Encounter (Signed)
--   called and left a message for call back- where would she like the orders for bedside commode to be faxed?    Copied from CRM [id number]. Topic: Clinical - Order For Equipment >> Jun 19, 2024  3:33 PM [place] MATSU wrote: Reason for CRM:  [facility] nurse called in requesting if we can place order for bedside commode as her toliet is too low after patient just had surgery 06/15/24.   May you please assist.

## 2024-06-21 DIAGNOSIS — K219 Gastro-esophageal reflux disease without esophagitis: Secondary | ICD-10-CM | POA: Diagnosis not present

## 2024-06-21 DIAGNOSIS — K7689 Other specified diseases of liver: Secondary | ICD-10-CM | POA: Diagnosis not present

## 2024-06-21 DIAGNOSIS — K760 Fatty (change of) liver, not elsewhere classified: Secondary | ICD-10-CM | POA: Diagnosis not present

## 2024-06-21 DIAGNOSIS — K750 Abscess of liver: Secondary | ICD-10-CM | POA: Diagnosis not present

## 2024-06-21 DIAGNOSIS — E785 Hyperlipidemia, unspecified: Secondary | ICD-10-CM | POA: Diagnosis not present

## 2024-06-21 DIAGNOSIS — E1140 Type 2 diabetes mellitus with diabetic neuropathy, unspecified: Secondary | ICD-10-CM | POA: Diagnosis not present

## 2024-06-21 DIAGNOSIS — E1169 Type 2 diabetes mellitus with other specified complication: Secondary | ICD-10-CM | POA: Diagnosis not present

## 2024-06-21 DIAGNOSIS — I2510 Atherosclerotic heart disease of native coronary artery without angina pectoris: Secondary | ICD-10-CM | POA: Diagnosis not present

## 2024-06-21 DIAGNOSIS — I10 Essential (primary) hypertension: Secondary | ICD-10-CM | POA: Diagnosis not present

## 2024-06-21 NOTE — Telephone Encounter (Signed)
 Note from surgeon was sent to us  for our review-we reviewed over this patient has upcoming appointment

## 2024-06-22 DIAGNOSIS — K7689 Other specified diseases of liver: Secondary | ICD-10-CM | POA: Diagnosis not present

## 2024-06-27 ENCOUNTER — Ambulatory Visit: Admitting: Family Medicine

## 2024-06-27 DIAGNOSIS — K7689 Other specified diseases of liver: Secondary | ICD-10-CM | POA: Diagnosis not present

## 2024-06-27 NOTE — Addendum Note (Signed)
 Addended by: Kasheem Toner M on: 06/27/2024 09:13 AM   Modules accepted: Orders

## 2024-06-27 NOTE — Telephone Encounter (Signed)
 Order placed

## 2024-06-28 ENCOUNTER — Encounter: Payer: Self-pay | Admitting: *Deleted

## 2024-06-28 ENCOUNTER — Ambulatory Visit (INDEPENDENT_AMBULATORY_CARE_PROVIDER_SITE_OTHER): Admitting: Family Medicine

## 2024-06-28 ENCOUNTER — Telehealth: Payer: Self-pay | Admitting: *Deleted

## 2024-06-28 VITALS — BP 144/68 | HR 89 | Temp 97.3°F | Ht 64.0 in | Wt 242.0 lb

## 2024-06-28 DIAGNOSIS — E1169 Type 2 diabetes mellitus with other specified complication: Secondary | ICD-10-CM

## 2024-06-28 DIAGNOSIS — R1011 Right upper quadrant pain: Secondary | ICD-10-CM | POA: Diagnosis not present

## 2024-06-28 DIAGNOSIS — R519 Headache, unspecified: Secondary | ICD-10-CM | POA: Diagnosis not present

## 2024-06-28 DIAGNOSIS — Z23 Encounter for immunization: Secondary | ICD-10-CM | POA: Diagnosis not present

## 2024-06-28 DIAGNOSIS — R002 Palpitations: Secondary | ICD-10-CM

## 2024-06-28 DIAGNOSIS — E1140 Type 2 diabetes mellitus with diabetic neuropathy, unspecified: Secondary | ICD-10-CM | POA: Diagnosis not present

## 2024-06-28 DIAGNOSIS — D509 Iron deficiency anemia, unspecified: Secondary | ICD-10-CM

## 2024-06-28 DIAGNOSIS — E785 Hyperlipidemia, unspecified: Secondary | ICD-10-CM

## 2024-06-28 DIAGNOSIS — I10 Essential (primary) hypertension: Secondary | ICD-10-CM | POA: Diagnosis not present

## 2024-06-28 NOTE — Progress Notes (Signed)
 Subjective:    Patient ID: [name], female    DOB: 1946/01/13, 78 y.o.   MRN: [id number]  HPI  Hospital follow up for hepatic cyst surgery - soreness, no drainage  EKG rec per provider due to low heart rate and elevated bp  Headache has been going on for a couple of days - located right side head down towards neck  Order for bedside commode-having severe pain difficult to walk with a walker cannot safely get into her bathroom  Discussed the use of AI scribe software for clinical note transcription with the patient, who gave verbal consent to proceed.  History of Present Illness   [name] is a 78 year old female who presents with post-operative symptoms following liver cyst surgery.  For the first four days post-surgery, she experienced frequent bowel movements occurring every time she urinated. Currently, she finds it straining to have bowel movements, although they remain soft. She experiences nausea with food intake, particularly with meats and greens, and feels full all the time. She reports being told she was a little dehydrated and tries to drink enough fluids.  She is taking oxycodone  5 mg every four hours for pain, but it is not effective. She supplements with Tylenol  and has about five oxycodone  pills remaining. She is also on her regular medications, including those for blood pressure.  She experiences headaches, described as a knot-like sensation on the right side of her head, sometimes affecting both sides. She notes a sensation of swelling in the area.  She is undergoing physical therapy at home twice a week and finds mobility challenging, requiring assistance from her son and daughter. She is awaiting a bedside commode to assist with mobility issues.      Review of Systems     Objective:   Physical Exam  General-in no acute distress Eyes-no discharge Lungs-respiratory rate normal, CTA CV-no murmurs, regular rhythm but occasional  palpitations and occasionally heart slows down bradycardic therefore will do EKG Extremities skin warm dry no edema Neuro grossly normal Behavior normal, alert Tender right upper quadrant  Status post liver cyst marsupialization Postoperative status following laparoscopic liver cyst marsupialization with complete removal of the cyst sac. 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 - Continue physical therapy at home twice a week. - Monitor for persistent or worsening symptoms that may require further imaging.  Postoperative pain Postoperative pain managed with oxycodone  5 mg every four hours, supplemented with Tylenol . Pain is significant, especially with movement, exacerbated by bathroom use. - Continue oxycodone  as prescribed, supplemented with Tylenol  as needed. - Arrange for a bedside commode to assist with mobility and reduce pain during bathroom use.  Type 2 diabetes mellitus Diabetes management is ongoing. Blood work will be repeated to assess current status, including glucose levels. - Repeat blood work to assess diabetes control.  Essential hypertension Blood pressure is currently elevated. Recent heart rate was low at 45 bpm, but has normalized to the 80s. Antihypertensive medication may need adjustment based on current readings and symptoms. - Monitor blood pressure closely. - Consider adjusting antihypertensive medication based on blood pressure readings and symptoms.  Headache Headache localized to the back of the head, possibly muscular in origin.  Nutritional intake post-surgery Nutritional intake is inadequate post-surgery, contributing to fatigue and potential dehydration. - Encourage increased intake of balanced diet including vegetables, fruits, and proteins. - Ensure adequate hydration to prevent dehydration.      Assessment & Plan:  Patient having severe pain related  to her liver surgery Has difficult time standing and walking using a  walker Patient's bathroom at home is very narrow and does not allow her to use the walker It is a safety hazard It is recommended for her to use a bedside commode until she is ambulating better More than likely will need a bedside commode for 4 to 8 weeks  1. Essential hypertension (Primary) Blood pressure good control EKG does show occasional extra beat and some bradycardia but other parts normal sinus rhythm - EKG 12-Lead  2. Type 2 diabetes mellitus with diabetic neuropathy, without long-term current use of insulin  (HCC) A1c previously under decent control recheck labs in the near future healthy diet continue current medication - Hemoglobin A1c - Basic Metabolic Panel (BMET)  3. Hyperlipidemia associated with type 2 diabetes mellitus (HCC) Continue current medication check labs goal is LDL below 70 if possible - Lipid Profile  4. Morbid obesity (HCC) Portion control regular physical activity  5. RUQ pain Recent surgery-would benefit from utilizing bedside commode should gradually improve if severe pain over the next several months may need to repeat ultrasound patient was encouraged to try to stay away from oxycodone  but if she does need more she is to contact us  we would give sparing amounts  6. Primary hypertension Decent control for given recent surgery and immobility - Basic Metabolic Panel (BMET)  7. Acute nonintractable headache, unspecified headache type I doubt that this is temporal arteritis but we will check a sed rate  8. Iron deficiency anemia, unspecified iron deficiency anemia type Patient does have microcytic changes consistent with iron deficiency check lab work - Fe+TIBC+Fer - CBC with Differential/Platelet  9. Nonintractable headache, unspecified chronicity pattern, unspecified headache type Check lab as stated above if sed rate within reason no need for further workup - Sed Rate (ESR)  10. Immunization due Flu vaccine today - Flu vaccine HIGH DOSE  PF(Fluzone Trivalent)  11. Palpitation No need for medications currently - EKG 12-Lead  Follow-up within the next 2-1/2 months

## 2024-06-29 ENCOUNTER — Telehealth: Payer: Self-pay | Admitting: *Deleted

## 2024-06-29 LAB — BASIC METABOLIC PANEL WITH GFR
BUN/Creatinine Ratio: 14 (ref 12–28)
BUN: 13 mg/dL (ref 8–27)
CO2: 23 mmol/L (ref 20–29)
Calcium: 10 mg/dL (ref 8.7–10.3)
Chloride: 101 mmol/L (ref 96–106)
Creatinine, Ser: 0.92 mg/dL (ref 0.57–1.00)
Glucose: 105 mg/dL — ABNORMAL HIGH (ref 70–99)
Potassium: 4.8 mmol/L (ref 3.5–5.2)
Sodium: 141 mmol/L (ref 134–144)
eGFR: 65 mL/min/1.73 (ref >59)

## 2024-06-29 LAB — IRON,TIBC AND FERRITIN PANEL
Ferritin: 100 ng/mL (ref 15–150)
Iron Saturation: 8 % — CL (ref 15–55)
Iron: 30 ug/dL (ref 27–139)
Total Iron Binding Capacity: 383 ug/dL (ref 250–450)
UIBC: 353 ug/dL (ref 118–369)

## 2024-06-29 LAB — CBC WITH DIFFERENTIAL/PLATELET
Basophils Absolute: 0 x10E3/uL (ref 0.0–0.2)
Basos: 1 %
EOS (ABSOLUTE): 0.1 x10E3/uL (ref 0.0–0.4)
Eos: 2 %
Hematocrit: 43.9 % (ref 34.0–46.6)
Hemoglobin: 13 g/dL (ref 11.1–15.9)
Immature Grans (Abs): 0 x10E3/uL (ref 0.0–0.1)
Immature Granulocytes: 0 %
Lymphocytes Absolute: 2.4 x10E3/uL (ref 0.7–3.1)
Lymphs: 31 %
MCH: 23.8 pg — ABNORMAL LOW (ref 26.6–33.0)
MCHC: 29.6 g/dL — ABNORMAL LOW (ref 31.5–35.7)
MCV: 80 fL (ref 79–97)
Monocytes Absolute: 0.6 x10E3/uL (ref 0.1–0.9)
Monocytes: 7 %
Neutrophils Absolute: 4.8 x10E3/uL (ref 1.4–7.0)
Neutrophils: 59 %
Platelets: 511 x10E3/uL — ABNORMAL HIGH (ref 150–450)
RBC: 5.47 x10E6/uL — ABNORMAL HIGH (ref 3.77–5.28)
RDW: 15.4 % (ref 11.7–15.4)
WBC: 7.9 x10E3/uL (ref 3.4–10.8)

## 2024-06-29 LAB — SEDIMENTATION RATE: Sed Rate: 38 mm/h (ref 0–40)

## 2024-06-29 LAB — LIPID PANEL
Chol/HDL Ratio: 3.1 ratio (ref 0.0–4.4)
Cholesterol, Total: 151 mg/dL (ref 100–199)
HDL: 49 mg/dL (ref >39)
LDL Chol Calc (NIH): 85 mg/dL (ref 0–99)
Triglycerides: 88 mg/dL (ref 0–149)
VLDL Cholesterol Cal: 17 mg/dL (ref 5–40)

## 2024-06-29 NOTE — Patient Instructions (Signed)
 Visit Information  Thank you for taking time to visit with me today. Please don't hesitate to contact me if I can be of assistance to you before our next scheduled telephone appointment.  Following are the goals we discussed today:   Goals Addressed             This Visit's Progress    VBCI Transitions of Care (TOC) Care Plan       Problems:  Recent Hospitalization for treatment of Liver cyst Knowledge Deficit Related to Liver cyst Pt saw primary care provider 06/28/24, BSC has been ordered and in process,  home health is working with pt  Goal:  Over the next 30 days, the patient will not experience hospital readmission  Interventions:  Transitions of Care: Doctor Visits  - discussed the importance of doctor visits Post discharge activity limitations prescribed by provider reviewed Post-op wound/incision care reviewed with patient/caregiver Reviewed Signs and symptoms of infection Reviewed carbohydrate modified diet, importance of good blood sugar control Pain assessment completed  Patient Self Care Activities:  Attend all scheduled provider appointments Call pharmacy for medication refills 3-7 days in advance of running out of medications Call provider office for new concerns or questions  Notify RN Care Manager of TOC call rescheduling needs Participate in Transition of Care Program/Attend TOC scheduled calls Take medications as prescribed   Please report any signs of infection to your surgeon  Plan:  Telephone follow up appointment with care management team member scheduled for: 07/05/24 @ 1030 am         Our next appointment is by telephone on 07/05/24 @ 1030 am  Please call the care guide team at [phone] if you need to cancel or reschedule your appointment.   If you are experiencing a Mental Health or Behavioral Health Crisis or need someone to talk to, please call the Suicide and Crisis Lifeline: 988 call the [place]  National Suicide Prevention Lifeline:  8063574398 or TTY: (254) 091-9718 TTY 631-522-2295) to talk to a trained counselor call 1-800-273-TALK (toll free, 24 hour hotline) go to [facility] [address] [place] [phone]) call 911   Care plan and visit instructions communicated with the patient verbally today. The patient DECLINED to receive copy of care plan and patient instructions in any format.   Telephone follow up appointment with care management team member scheduled for: 07/05/24 @ 1030 am  Mliss Creed [facility], BSN RN Care Manager/ Transition of Care [place]/ [facility] [phone]

## 2024-06-29 NOTE — Patient Outreach (Signed)
 Transition of Care week 2  Visit Note  06/29/2024  Name: [name] MRN: [id number]          DOB: 06-04-1946  Situation: Patient enrolled in [facility] 30-day program. Visit completed with patient by telephone.   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 mild CAD by cath in 2005 and 2012 b. low-risk NST's in 07/2016 and 05/2019   Cyst on liver   Depression    Diabetes mellitus    controlled by diet   Diverticulitis    DVT of axillary vein, acute right (HCC) 11/24/2012   Dx on Jan 28, 2012.     Dysrhythmia    Fibrocystic breast disease    Hepatic cyst    Hypertension    IBS (irritable bowel syndrome)    Impaired glucose tolerance    Menopause    Tachycardia     Assessment: Patient Reported Symptoms: Cognitive Cognitive Status: No symptoms reported, Able to follow simple commands, Alert and oriented to person, place, and time, Normal speech and language skills      Neurological Neurological Review of Symptoms: No symptoms reported    HEENT HEENT Symptoms Reported: No symptoms reported      Cardiovascular Cardiovascular Symptoms Reported: No symptoms reported    Respiratory Respiratory Symptoms Reported: No symptoms reported    Endocrine Endocrine Symptoms Reported: No symptoms reported Is patient diabetic?: Yes Is patient checking blood sugars at home?: Yes List most recent blood sugar readings, include date and time of day: FBS 112 today Endocrine Self-Management Outcome: 4 (good)  Gastrointestinal Gastrointestinal Symptoms Reported: No symptoms reported      Genitourinary Genitourinary Symptoms Reported: No symptoms reported    Integumentary Integumentary Symptoms Reported: Incision Additional Integumentary Details: pt states incision to RUQ looks good denies any signs/ symptoms of infection Skin Management  Strategies: Adequate rest Skin Self-Management Outcome: 4 (good) Skin Comment: reviewed signs/ symptoms of infection, reportable signs /symptoms  Musculoskeletal Musculoskelatal Symptoms Reviewed: Limited mobility Additional Musculoskeletal Details: uses walker, pt states BSC has been ordered and in process so she can easily and more safely sit on the commode Musculoskeletal Management Strategies: Adequate rest Musculoskeletal Self-Management Outcome: 4 (good) Musculoskeletal Comment: reviewed safety precautions      Psychosocial Psychosocial Symptoms Reported: No symptoms reported         There were no vitals filed for this visit.  Medications Reviewed Today     Reviewed by Aura Mliss LABOR, RN (Registered Nurse) on 06/29/24 at [phone]  Med List Status: <None>   Medication Order Taking? Sig Documenting Provider Last Dose Status Informant  acetaminophen  (TYLENOL ) 500 MG tablet [id number]  Take 1,000 mg by mouth every 6 (six) hours as needed for mild pain, moderate pain or headache.  [provider]  Active Self, Pharmacy Records  amLODipine  (NORVASC ) 2.5 MG tablet [id number]  Take 1 tablet (2.5 mg total) by mouth daily. Alphonsa Glendia LABOR, MD  Active   Blood Glucose Monitoring Suppl (PRODIGY AUTOCODE BLOOD GLUCOSE) w/Device KIT [id number]  USE AS DIRECTED Alphonsa Glendia LABOR, MD  Active   Cholecalciferol (VITAMIN D-3) 125 MCG (5000 UT) TABS [id number]  Take 5,000 Units by mouth daily. [provider]  Active   clopidogrel  (PLAVIX ) 75 MG tablet [id number]  TAKE 1 TABLET EVERY DAY (NEED MD APPOINTMENT FOR REFILLS) Alvan Carrier  F, MD  Active   diclofenac  Sodium (VOLTAREN ) 1 % GEL [id number]  Apply 2 g topically 4 (four) times daily. Rub into affected area of foot 2 to 4 times daily Gershon Donnice SAUNDERS, DPM  Active Self, Pharmacy Records  HYDROcodone -acetaminophen  (NORCO/VICODIN) 5-325 MG tablet [id number]  Take one tab po q 4-6 hours prn severe pain; drowsiness precautions  Patient not  taking: Reported on 06/19/2024   Hoskins, Carolyn C, NP  Active   latanoprost (XALATAN) 0.005 % ophthalmic solution [id number]  Place 1 drop into both eyes at bedtime. [provider]  Active Self, Pharmacy Records  lidocaine  (LIDODERM ) 5 % [id number]  PLACE 1 PATCH ONTO THE SKIN DAILY. REMOVE AND DISCARD PATCH WITHIN 12 HOURS OR AS DIRECTED BY MD Alphonsa Glendia LABOR, MD  Active   LINZESS  290 MCG CAPS capsule [id number]  TAKE 1 CAPSULE EVERY DAY BEFORE BREAKFAST Ezzard Sonny RAMAN, PA-C  Active   oxyCODONE  (OXY IR/ROXICODONE ) 5 MG immediate release tablet [id number]  Take 5 mg by mouth every 4 (four) hours as needed. [provider]  Active   pantoprazole  (PROTONIX ) 40 MG tablet [id number]  Take 1 tablet (40 mg total) by mouth daily. Alphonsa Glendia LABOR, MD  Active   pravastatin  (PRAVACHOL ) 20 MG tablet [id number]  TAKE 1 TABLET EVERY EVENING ( DOSE DECREASE ) Alphonsa Glendia LABOR, MD  Active   PRODIGY NO CODING BLOOD GLUC test strip [id number]  TEST BLOOD SUGAR AS INSTRUCTED Alphonsa Glendia LABOR, MD  Active   Prodigy Twist Top Lancets 28G MISC [id number]  USE AS DIRECTED Alphonsa Glendia LABOR, MD  Active   sitaGLIPtin  (JANUVIA ) 50 MG tablet [id number]  Take 1 tablet (50 mg total) by mouth daily. Alphonsa Glendia LABOR, MD  Active             Goals Addressed             This Visit's Progress    VBCI Transitions of Care (TOC) Care Plan       Problems:  Recent Hospitalization for treatment of Liver cyst Knowledge Deficit Related to Liver cyst Pt saw primary care provider 06/28/24, BSC has been ordered and in process,  home health is working with pt  Goal:  Over the next 30 days, the patient will not experience hospital readmission  Interventions:  Transitions of Care: Doctor Visits  - discussed the importance of doctor visits Post discharge activity limitations prescribed by provider reviewed Post-op wound/incision care reviewed with patient/caregiver Reviewed Signs and symptoms of infection Reviewed  carbohydrate modified diet, importance of good blood sugar control Pain assessment completed  Patient Self Care Activities:  Attend all scheduled provider appointments Call pharmacy for medication refills 3-7 days in advance of running out of medications Call provider office for new concerns or questions  Notify RN Care Manager of TOC call rescheduling needs Participate in Transition of Care Program/Attend TOC scheduled calls Take medications as prescribed   Please report any signs of infection to your surgeon  Plan:  Telephone follow up appointment with care management team member scheduled for: 07/05/24 @ 1030 am         Recommendation:   PCP Follow-up  Follow Up Plan:   Telephone follow-up 07/05/24 @ 1030 am  Mliss Creed [facility], BSN RN Care Manager/ Transition of Care [place]/ [facility] [phone]

## 2024-07-01 ENCOUNTER — Ambulatory Visit: Payer: Self-pay | Admitting: Family Medicine

## 2024-07-01 NOTE — Telephone Encounter (Signed)
 A prescription was signed off on and given to nursing staff

## 2024-07-02 ENCOUNTER — Other Ambulatory Visit: Payer: Self-pay

## 2024-07-02 DIAGNOSIS — D509 Iron deficiency anemia, unspecified: Secondary | ICD-10-CM

## 2024-07-03 ENCOUNTER — Telehealth: Payer: Self-pay

## 2024-07-05 ENCOUNTER — Other Ambulatory Visit: Payer: Self-pay | Admitting: *Deleted

## 2024-07-05 NOTE — Patient Outreach (Signed)
 Transition of Care week 3  Visit Note  07/05/2024  Name: [name] MRN: [id number]          DOB: [birth year]  Situation: Patient enrolled in [facility] 30-day program. Visit completed with patient by telephone.   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 mild CAD by cath in 2005 and 2012 b. low-risk NST's in 07/2016 and 05/2019   Cyst on liver   Depression    Diabetes mellitus    controlled by diet   Diverticulitis    DVT of axillary vein, acute right (HCC) 11/24/2012   Dx on Jan 28, 2012.     Dysrhythmia    Fibrocystic breast disease    Hepatic cyst    Hypertension    IBS (irritable bowel syndrome)    Impaired glucose tolerance    Menopause    Tachycardia     Assessment: Patient Reported Symptoms: Cognitive Cognitive Status: No symptoms reported, Able to follow simple commands, Alert and oriented to person, place, and time, Normal speech and language skills      Neurological Neurological Review of Symptoms: No symptoms reported    HEENT HEENT Symptoms Reported: No symptoms reported      Cardiovascular Cardiovascular Symptoms Reported: Fatigue Does patient have uncontrolled Hypertension?: No Cardiovascular Comment: pt states her iron is low from recent bloodwork, will be going to cancer center 12/5 for possible iron infusion  Respiratory Respiratory Symptoms Reported: No symptoms reported    Endocrine Endocrine Symptoms Reported: No symptoms reported Is patient diabetic?: Yes Is patient checking blood sugars at home?: Yes List most recent blood sugar readings, include date and time of day: pt has not checked CBG recently Endocrine Self-Management Outcome: 4 (good)  Gastrointestinal Gastrointestinal Symptoms Reported: No symptoms reported      Genitourinary Genitourinary Symptoms Reported: No symptoms reported     Integumentary Integumentary Symptoms Reported: Incision Additional Integumentary Details: pt reports incision to RUQ is wnl, denies any signs/ symptoms of infection Skin Management Strategies: Adequate rest Skin Self-Management Outcome: 4 (good) Skin Comment: reinforced signs/ symptoms of infection, reportable signs/ symptoms  Musculoskeletal Musculoskelatal Symptoms Reviewed: Limited mobility Additional Musculoskeletal Details: uses walker, pt states BSC is still in process Musculoskeletal Management Strategies: Adequate rest Musculoskeletal Self-Management Outcome: 4 (good) Musculoskeletal Comment: reinforced safety precautions, importance of working with PT      Psychosocial Psychosocial Symptoms Reported: No symptoms reported         There were no vitals filed for this visit.  Medications Reviewed Today     Reviewed by Aura Mliss LABOR, RN (Registered Nurse) on 07/05/24 at 1028  Med List Status: <None>   Medication Order Taking? Sig Documenting Provider Last Dose Status Informant  acetaminophen  (TYLENOL ) 500 MG tablet [id number]  Take 1,000 mg by mouth every 6 (six) hours as needed for mild pain, moderate pain or headache.  [provider]  Active Self, Pharmacy Records  amLODipine  (NORVASC ) 2.5 MG tablet [id number]  Take 1 tablet (2.5 mg total) by mouth daily. Alphonsa Glendia LABOR, MD  Active   Blood Glucose Monitoring Suppl (PRODIGY AUTOCODE BLOOD GLUCOSE) w/Device KIT [id number]  USE AS DIRECTED Alphonsa Glendia LABOR, MD  Active   Cholecalciferol (VITAMIN D-3) 125 MCG (5000 UT) TABS [id number]  Take 5,000 Units by mouth daily. [provider]  Active  clopidogrel  (PLAVIX ) 75 MG tablet [id number]  TAKE 1 TABLET EVERY DAY (NEED MD APPOINTMENT FOR REFILLS) Alvan Dorn FALCON, MD  Active   diclofenac  Sodium (VOLTAREN ) 1 % GEL [id number]  Apply 2 g topically 4 (four) times daily. Rub into affected area of foot 2 to 4 times daily Gershon Donnice SAUNDERS, DPM  Active Self, Pharmacy Records   HYDROcodone -acetaminophen  (NORCO/VICODIN) 5-325 MG tablet [id number]  Take one tab po q 4-6 hours prn severe pain; drowsiness precautions  Patient not taking: Reported on 06/19/2024   Hoskins, Carolyn C, NP  Active   latanoprost (XALATAN) 0.005 % ophthalmic solution [id number]  Place 1 drop into both eyes at bedtime. [provider]  Active Self, Pharmacy Records  lidocaine  (LIDODERM ) 5 % [id number]  PLACE 1 PATCH ONTO THE SKIN DAILY. REMOVE AND DISCARD PATCH WITHIN 12 HOURS OR AS DIRECTED BY MD Alphonsa Glendia LABOR, MD  Active   LINZESS  290 MCG CAPS capsule [id number]  TAKE 1 CAPSULE EVERY DAY BEFORE BREAKFAST Ezzard Sonny RAMAN, PA-C  Active   oxyCODONE  (OXY IR/ROXICODONE ) 5 MG immediate release tablet [id number]  Take 5 mg by mouth every 4 (four) hours as needed. [provider]  Active   pantoprazole  (PROTONIX ) 40 MG tablet [id number]  Take 1 tablet (40 mg total) by mouth daily. Alphonsa Glendia LABOR, MD  Active   pravastatin  (PRAVACHOL ) 20 MG tablet [id number]  TAKE 1 TABLET EVERY EVENING ( DOSE DECREASE ) Alphonsa Glendia LABOR, MD  Active   PRODIGY NO CODING BLOOD GLUC test strip [id number]  TEST BLOOD SUGAR AS INSTRUCTED Alphonsa Glendia LABOR, MD  Active   Prodigy Twist Top Lancets 28G MISC [id number]  USE AS DIRECTED Alphonsa Glendia LABOR, MD  Active   sitaGLIPtin  (JANUVIA ) 50 MG tablet [id number]  Take 1 tablet (50 mg total) by mouth daily. Alphonsa Glendia LABOR, MD  Active             Goals Addressed             This Visit's Progress    VBCI Transitions of Care (TOC) Care Plan       Problems:  Recent Hospitalization for treatment of Liver cyst Knowledge Deficit Related to Liver cyst Pt saw primary care provider 06/28/24, [facility] has been ordered and in process,  home health is working with pt 07/05/24- pt reports doing well except for being tired, I have low iron and will be getting an infusion at cancer center on 08/03/24   pt states nurse at primary care provider sent referral for [facility] but pt has not heard  anything, home health PT continues working with pt  Goal:  Over the next 30 days, the patient will not experience hospital readmission  Interventions:  Transitions of Care: Doctor Visits  - discussed the importance of doctor visits Post discharge activity limitations prescribed by provider reviewed Post-op wound/incision care reviewed with patient/caregiver Reviewed Signs and symptoms of infection Reinforced carbohydrate modified diet, importance of good blood sugar control Pain assessment completed In basket message sent to Laurann Mace LPN at primary care provider requesting follow up on referral for [facility] and which DME provider order was sent to  Patient Self Care Activities:  Attend all scheduled provider appointments Call pharmacy for medication refills 3-7 days in advance of running out of medications Call provider office for new concerns or questions  Notify RN Care Manager of TOC call rescheduling needs Participate in Transition of Care Program/Attend TOC scheduled calls Take medications as prescribed   Please report  any signs of infection to your surgeon  Plan:  Telephone follow up appointment with care management team member scheduled for: 07/12/24 @ 1030 am         Recommendation:   PCP Follow-up  Follow Up Plan:   Telephone follow-up 07/12/24 @ 1030 am  Mliss Creed [facility], BSN RN Care Manager/ Transition of Care [place]/ [facility] [phone]

## 2024-07-05 NOTE — Patient Instructions (Signed)
 Visit Information  Thank you for taking time to visit with me today. Please don't hesitate to contact me if I can be of assistance to you before our next scheduled telephone appointment.  Our next appointment is by telephone on 07/12/24 @ 1030 am  Following is a copy of your care plan:   Goals Addressed             This Visit's Progress    VBCI Transitions of Care (TOC) Care Plan       Problems:  Recent Hospitalization for treatment of Liver cyst Knowledge Deficit Related to Liver cyst Pt saw primary care provider 06/28/24, [facility] has been ordered and in process,  home health is working with pt 07/05/24- pt reports doing well except for being tired, I have low iron and will be getting an infusion at cancer center on 08/03/24   pt states nurse at primary care provider sent referral for [facility] but pt has not heard anything, home health PT continues working with pt  Goal:  Over the next 30 days, the patient will not experience hospital readmission  Interventions:  Transitions of Care: Doctor Visits  - discussed the importance of doctor visits Post discharge activity limitations prescribed by provider reviewed Post-op wound/incision care reviewed with patient/caregiver Reviewed Signs and symptoms of infection Reinforced carbohydrate modified diet, importance of good blood sugar control Pain assessment completed In basket message sent to Laurann Mace LPN at primary care provider requesting follow up on referral for [facility] and which DME provider order was sent to  Patient Self Care Activities:  Attend all scheduled provider appointments Call pharmacy for medication refills 3-7 days in advance of running out of medications Call provider office for new concerns or questions  Notify RN Care Manager of [facility] call rescheduling needs Participate in Transition of Care Program/Attend TOC scheduled calls Take medications as prescribed   Please report any signs of infection to your surgeon  Plan:   Telephone follow up appointment with care management team member scheduled for: 07/12/24 @ 1030 am        Care plan and visit instructions communicated with the patient verbally today. The patient DECLINED to receive copy of care plan and patient instructions in any format.   Telephone follow up appointment with care management team member scheduled for:  07/12/24 @ 1030 am  Please call the care guide team at [phone] if you need to cancel or reschedule your appointment.   Please call the Suicide and Crisis Lifeline: 988 call the [place]  National Suicide Prevention Lifeline: 548-347-4494 or TTY: 684-079-0406 TTY (901)040-1223) to talk to a trained counselor call 1-800-273-TALK (toll free, 24 hour hotline) go to [facility] [address] [place] [phone]) call 911 if you are experiencing a Mental Health or Behavioral Health Crisis or need someone to talk to.  Mliss Creed [facility], BSN RN Care Manager/ Transition of Care [place]/ [facility] [phone]

## 2024-07-12 ENCOUNTER — Other Ambulatory Visit: Payer: Self-pay | Admitting: *Deleted

## 2024-07-12 NOTE — Patient Outreach (Signed)
 Transition of Care week 4  Visit Note  07/12/2024  Name: [name] MRN: [id number]          DOB: 21-Dec-1945  Situation: Patient enrolled in [facility] 30-day program. Visit completed with patient by telephone  Background: Discharge Date and Diagnosis: 06/17/24, Liver cyst   Past Medical History:  Diagnosis Date   Blood clot of artery under arm (HCC) 2017   Bronchitis    Chest pain    a. mild CAD by cath in 2005 and 2012 b. low-risk NST's in 07/2016 and 05/2019   Cyst on liver   Depression    Diabetes mellitus    controlled by diet   Diverticulitis    DVT of axillary vein, acute right (HCC) 11/24/2012   Dx on Jan 28, 2012.     Dysrhythmia    Fibrocystic breast disease    Hepatic cyst    Hypertension    IBS (irritable bowel syndrome)    Impaired glucose tolerance    Menopause    Tachycardia     Assessment: Patient Reported Symptoms: Cognitive Cognitive Status: No symptoms reported, Alert and oriented to person, place, and time, Able to follow simple commands, Normal speech and language skills      Neurological Neurological Review of Symptoms: No symptoms reported    HEENT HEENT Symptoms Reported: No symptoms reported      Cardiovascular Cardiovascular Symptoms Reported: Fatigue Does patient have uncontrolled Hypertension?: No Cardiovascular Management Strategies: Adequate rest Cardiovascular Self-Management Outcome: 3 (uncertain)  Respiratory Respiratory Symptoms Reported: No symptoms reported    Endocrine Endocrine Symptoms Reported: No symptoms reported Is patient diabetic?: Yes Is patient checking blood sugars at home?: Yes List most recent blood sugar readings, include date and time of day: pt checks CBG only on occasion, does not have recent reading to report Endocrine Self-Management Outcome: 4 (good)  Gastrointestinal Gastrointestinal Symptoms Reported: No symptoms reported      Genitourinary Genitourinary Symptoms Reported: No symptoms  reported    Integumentary Integumentary Symptoms Reported: Incision Additional Integumentary Details: incision to RUQ wnl per pt report Skin Management Strategies: Adequate rest Skin Self-Management Outcome: 4 (good) Skin Comment: reviewed signs symptoms of infection  Musculoskeletal Musculoskelatal Symptoms Reviewed: Limited mobility Additional Musculoskeletal Details: pt states she is now using cane, BSC was delivered on 07/09/24 Musculoskeletal Management Strategies: Adequate rest Musculoskeletal Self-Management Outcome: 4 (good) Musculoskeletal Comment: reviewed safety precautions, importance of working with PT      Psychosocial Psychosocial Symptoms Reported: No symptoms reported         Vitals:   07/12/24 1053  BP: 130/69   Pain Scale: 0-10 Pain Score: 8  Pain Type: Other (Comment) (all over, sore from physical therapy) Pain Location:  (generalized) Pain Orientation: Other (Comment) (all over sore muscles) Pain Descriptors / Indicators: Aching, Sore Pain Onset: On-going Patients Stated Pain Goal: 3 Pain Intervention(s): Medication (See eMAR), Relaxation, Rest Multiple Pain Sites: No  Medications Reviewed Today     Reviewed by Aura Mliss LABOR, RN (Registered Nurse) on 07/12/24 at 1053  Med List Status: <None>   Medication Order Taking? Sig Documenting Provider Last Dose Status Informant  acetaminophen  (TYLENOL ) 500 MG tablet [id number]  Take 1,000 mg by mouth every 6 (six) hours as needed for mild pain, moderate pain or headache.  [provider]  Active Self, Pharmacy Records  amLODipine  (NORVASC ) 2.5 MG tablet [id number]  Take 1 tablet (2.5 mg total) by mouth daily. Alphonsa Glendia LABOR, MD  Active   Blood Glucose Monitoring  Suppl (PRODIGY AUTOCODE BLOOD GLUCOSE) w/Device KIT [id number]  USE AS DIRECTED Alphonsa Glendia LABOR, MD  Active   Cholecalciferol (VITAMIN D-3) 125 MCG (5000 UT) TABS [id number]  Take 5,000 Units by mouth daily. [provider]  Active    clopidogrel  (PLAVIX ) 75 MG tablet [id number]  TAKE 1 TABLET EVERY DAY (NEED MD APPOINTMENT FOR REFILLS) Alvan Dorn FALCON, MD  Active   diclofenac  Sodium (VOLTAREN ) 1 % GEL [id number]  Apply 2 g topically 4 (four) times daily. Rub into affected area of foot 2 to 4 times daily Gershon Donnice SAUNDERS, DPM  Active Self, Pharmacy Records  HYDROcodone -acetaminophen  (NORCO/VICODIN) 5-325 MG tablet [id number]  Take one tab po q 4-6 hours prn severe pain; drowsiness precautions  Patient not taking: Reported on 06/19/2024   Hoskins, Carolyn C, NP  Active   latanoprost (XALATAN) 0.005 % ophthalmic solution [id number]  Place 1 drop into both eyes at bedtime. [provider]  Active Self, Pharmacy Records  lidocaine  (LIDODERM ) 5 % [id number]  PLACE 1 PATCH ONTO THE SKIN DAILY. REMOVE AND DISCARD PATCH WITHIN 12 HOURS OR AS DIRECTED BY MD Alphonsa Glendia LABOR, MD  Active   LINZESS  290 MCG CAPS capsule [id number]  TAKE 1 CAPSULE EVERY DAY BEFORE BREAKFAST Ezzard Sonny RAMAN, PA-C  Active   oxyCODONE  (OXY IR/ROXICODONE ) 5 MG immediate release tablet [id number]  Take 5 mg by mouth every 4 (four) hours as needed. [provider]  Active   pantoprazole  (PROTONIX ) 40 MG tablet [id number]  Take 1 tablet (40 mg total) by mouth daily. Alphonsa Glendia LABOR, MD  Active   pravastatin  (PRAVACHOL ) 20 MG tablet [id number]  TAKE 1 TABLET EVERY EVENING ( DOSE DECREASE ) Alphonsa Glendia LABOR, MD  Active   PRODIGY NO CODING BLOOD GLUC test strip [id number]  TEST BLOOD SUGAR AS INSTRUCTED Alphonsa Glendia LABOR, MD  Active   Prodigy Twist Top Lancets 28G MISC [id number]  USE AS DIRECTED Alphonsa Glendia LABOR, MD  Active   sitaGLIPtin  (JANUVIA ) 50 MG tablet [id number]  Take 1 tablet (50 mg total) by mouth daily. Alphonsa Glendia LABOR, MD  Active             Goals Addressed             This Visit's Progress    VBCI Transitions of Care (TOC) Care Plan       Problems:  Recent Hospitalization for treatment of Liver cyst Knowledge Deficit Related to Liver  cyst Pt saw primary care provider 06/28/24, BSC has been ordered and in process,  home health is working with pt 07/05/24- pt reports doing well except for being tired, I have low iron and will be getting an infusion at cancer center on 08/03/24   pt states nurse at primary care provider sent referral for [facility] but pt has not heard anything, home health PT continues working with pt 07/12/24- pt reports she worked really hard with PT yesterday and is sore today, BSC was delivered on 07/09/24, pt is now using a cane  Goal:  Over the next 30 days, the patient will not experience hospital readmission  Interventions:  Transitions of Care: Doctor Visits  - discussed the importance of doctor visits Post discharge activity limitations prescribed by provider reviewed Post-op wound/incision care reviewed with patient/caregiver Reviewed Signs and symptoms of infection Reviewed carbohydrate modified diet, importance of good blood sugar control Pain assessment completed  Patient Self Care Activities:  Attend all scheduled provider appointments Call pharmacy for medication refills 3-7 days in  advance of running out of medications Call provider office for new concerns or questions  Notify RN Care Manager of TOC call rescheduling needs Participate in Transition of Care Program/Attend TOC scheduled calls Take medications as prescribed   Please report any signs of infection to your surgeon Continue to work with physical therapist, complete prescribed exercises  Plan:  Telephone follow up appointment with care management team member scheduled for: 07/19/24 @ 215 pm         Recommendation:   PCP Follow-up  Follow Up Plan:   Telephone follow-up 07/19/24 @ 215 pm  Mliss Creed [facility], BSN RN Care Manager/ Transition of Care [place]/ [facility] Population Health [phone]

## 2024-07-12 NOTE — Patient Instructions (Signed)
 Visit Information  Thank you for taking time to visit with me today. Please don't hesitate to contact me if I can be of assistance to you before our next scheduled telephone appointment.  Our next appointment is by telephone on 07/19/24 @ 215 pm  Following is a copy of your care plan:   Goals Addressed             This Visit's Progress    VBCI Transitions of Care (TOC) Care Plan       Problems:  Recent Hospitalization for treatment of Liver cyst Knowledge Deficit Related to Liver cyst Pt saw primary care provider 06/28/24, [facility] has been ordered and in process,  home health is working with pt 07/05/24- pt reports doing well except for being tired, I have low iron and will be getting an infusion at cancer center on 08/03/24   pt states nurse at primary care provider sent referral for [facility] but pt has not heard anything, home health PT continues working with pt 07/12/24- pt reports she worked really hard with PT yesterday and is sore today, BSC was delivered on 07/09/24, pt is now using a cane  Goal:  Over the next 30 days, the patient will not experience hospital readmission  Interventions:  Transitions of Care: Doctor Visits  - discussed the importance of doctor visits Post discharge activity limitations prescribed by provider reviewed Post-op wound/incision care reviewed with patient/caregiver Reviewed Signs and symptoms of infection Reviewed carbohydrate modified diet, importance of good blood sugar control Pain assessment completed  Patient Self Care Activities:  Attend all scheduled provider appointments Call pharmacy for medication refills 3-7 days in advance of running out of medications Call provider office for new concerns or questions  Notify RN Care Manager of TOC call rescheduling needs Participate in Transition of Care Program/Attend TOC scheduled calls Take medications as prescribed   Please report any signs of infection to your surgeon Continue to work with  physical therapist, complete prescribed exercises  Plan:  Telephone follow up appointment with care management team member scheduled for: 07/19/24 @ 215 pm        Care plan and visit instructions communicated with the patient verbally today. The patient DECLINED to receive copy of care plan and patient instructions in any format.   Telephone follow up appointment with care management team member scheduled for: 07/19/24 @ 215 pm  Please call the care guide team at [phone] if you need to cancel or reschedule your appointment.   Please call the Suicide and Crisis Lifeline: 988 call the [place]  National Suicide Prevention Lifeline: 708-034-7977 or TTY: (289) 001-3540 TTY (302) 765-3432) to talk to a trained counselor call 1-800-273-TALK (toll free, 24 hour hotline) go to [facility] [address] [place] [phone]) call 911 if you are experiencing a Mental Health or Behavioral Health Crisis or need someone to talk to.  Mliss Creed [facility], BSN RN Care Manager/ Transition of Care [place]/ [facility] [phone]

## 2024-07-15 ENCOUNTER — Other Ambulatory Visit: Payer: Self-pay | Admitting: Family Medicine

## 2024-07-17 ENCOUNTER — Encounter (HOSPITAL_COMMUNITY): Payer: Self-pay | Admitting: *Deleted

## 2024-07-17 ENCOUNTER — Emergency Department (HOSPITAL_COMMUNITY)
Admission: EM | Admit: 2024-07-17 | Discharge: 2024-07-17 | Disposition: A | Discharge status: Home / Self Care | Attending: Emergency Medicine | Admitting: Emergency Medicine

## 2024-07-17 ENCOUNTER — Other Ambulatory Visit: Payer: Self-pay

## 2024-07-17 DIAGNOSIS — K750 Abscess of liver: Secondary | ICD-10-CM | POA: Diagnosis not present

## 2024-07-17 DIAGNOSIS — R55 Syncope and collapse: Secondary | ICD-10-CM | POA: Diagnosis not present

## 2024-07-17 DIAGNOSIS — K7689 Other specified diseases of liver: Secondary | ICD-10-CM | POA: Diagnosis not present

## 2024-07-17 DIAGNOSIS — E1140 Type 2 diabetes mellitus with diabetic neuropathy, unspecified: Secondary | ICD-10-CM | POA: Diagnosis not present

## 2024-07-17 DIAGNOSIS — K760 Fatty (change of) liver, not elsewhere classified: Secondary | ICD-10-CM | POA: Diagnosis not present

## 2024-07-17 DIAGNOSIS — I10 Essential (primary) hypertension: Secondary | ICD-10-CM | POA: Diagnosis not present

## 2024-07-17 DIAGNOSIS — E1169 Type 2 diabetes mellitus with other specified complication: Secondary | ICD-10-CM | POA: Diagnosis not present

## 2024-07-17 DIAGNOSIS — K219 Gastro-esophageal reflux disease without esophagitis: Secondary | ICD-10-CM | POA: Diagnosis not present

## 2024-07-17 DIAGNOSIS — E785 Hyperlipidemia, unspecified: Secondary | ICD-10-CM | POA: Diagnosis not present

## 2024-07-17 DIAGNOSIS — I2510 Atherosclerotic heart disease of native coronary artery without angina pectoris: Secondary | ICD-10-CM | POA: Diagnosis not present

## 2024-07-17 LAB — URINALYSIS, ROUTINE W REFLEX MICROSCOPIC
Bilirubin Urine: NEGATIVE
Glucose, UA: NEGATIVE mg/dL
Hgb urine dipstick: NEGATIVE
Ketones, ur: NEGATIVE mg/dL
Leukocytes,Ua: NEGATIVE
Nitrite: NEGATIVE
Protein, ur: NEGATIVE mg/dL
Specific Gravity, Urine: 1.006 (ref 1.005–1.030)
pH: 5 (ref 5.0–8.0)

## 2024-07-17 LAB — CBC
HCT: 43.1 % (ref 36.0–46.0)
Hemoglobin: 13.4 g/dL (ref 12.0–15.0)
MCH: 24.1 pg — ABNORMAL LOW (ref 26.0–34.0)
MCHC: 31.1 g/dL (ref 30.0–36.0)
MCV: 77.4 fL — ABNORMAL LOW (ref 80.0–100.0)
Platelets: 380 K/uL (ref 150–400)
RBC: 5.57 MIL/uL — ABNORMAL HIGH (ref 3.87–5.11)
RDW: 17 % — ABNORMAL HIGH (ref 11.5–15.5)
WBC: 6.6 K/uL (ref 4.0–10.5)
nRBC: 0 % (ref 0.0–0.2)

## 2024-07-17 LAB — COMPREHENSIVE METABOLIC PANEL WITH GFR
ALT: 21 U/L (ref 0–44)
AST: 22 U/L (ref 15–41)
Albumin: 4.4 g/dL (ref 3.5–5.0)
Alkaline Phosphatase: 91 U/L (ref 38–126)
Anion gap: 13 (ref 5–15)
BUN: 16 mg/dL (ref 8–23)
CO2: 25 mmol/L (ref 22–32)
Calcium: 10.1 mg/dL (ref 8.9–10.3)
Chloride: 101 mmol/L (ref 98–111)
Creatinine, Ser: 0.83 mg/dL (ref 0.44–1.00)
GFR, Estimated: >60 mL/min (ref >60)
Glucose, Bld: 128 mg/dL — ABNORMAL HIGH (ref 70–99)
Potassium: 4.3 mmol/L (ref 3.5–5.1)
Sodium: 139 mmol/L (ref 135–145)
Total Bilirubin: 0.3 mg/dL (ref 0.0–1.2)
Total Protein: 7.7 g/dL (ref 6.5–8.1)

## 2024-07-17 LAB — CBG MONITORING, ED: Glucose-Capillary: 124 mg/dL — ABNORMAL HIGH (ref 70–99)

## 2024-07-17 MED ORDER — SODIUM CHLORIDE 0.9 % IV BOLUS
500.0000 mL | Freq: Once | INTRAVENOUS | Status: AC
  Administered 2024-07-17: 500 mL via INTRAVENOUS

## 2024-07-17 NOTE — ED Triage Notes (Signed)
 Pt's family reports pt passed out in the house, ems came to the house and family states the ems refused to transport pt to the hospital.  Pt c/o severe fatigue. Pt has HH nurse and was there when pt passed out. Pt recent surgery on liver.

## 2024-07-17 NOTE — Discharge Instructions (Signed)
Follow-up with your doctor as planned 

## 2024-07-17 NOTE — ED Provider Notes (Signed)
 [place] EMERGENCY DEPARTMENT AT [facility] Provider Note   CSN: [id number] Arrival date & time: 07/17/24  1706     Patient presents with: Loss of Consciousness   [name] is a 78 y.o. female.  {Add pertinent medical, surgical, social history, OB history to YEP:67052} Patient states that he became weak and passed out.  She has been feeling fatigued lately.  She has a history of diabetes and recent liver procedure   Loss of Consciousness      Prior to Admission medications   Medication Sig Start Date End Date Taking? Authorizing Provider  acetaminophen  (TYLENOL ) 500 MG tablet Take 1,000 mg by mouth every 6 (six) hours as needed for mild pain, moderate pain or headache.    Yes [provider]  amLODipine  (NORVASC ) 2.5 MG tablet Take 1 tablet (2.5 mg total) by mouth daily. 04/19/24  Yes Luking, Glendia LABOR, MD  Cholecalciferol (VITAMIN D-3) 125 MCG (5000 UT) TABS Take 5,000 Units by mouth daily.   Yes [provider]  clopidogrel  (PLAVIX ) 75 MG tablet TAKE 1 TABLET EVERY DAY (NEED MD APPOINTMENT FOR REFILLS) Patient taking differently: Take 75 mg by mouth every evening. TAKE 1 TABLET EVERY DAY (NEED MD APPOINTMENT FOR REFILLS) 09/26/23  Yes Branch, Dorn FALCON, MD  diclofenac  Sodium (VOLTAREN ) 1 % GEL Apply 2 g topically 4 (four) times daily. Rub into affected area of foot 2 to 4 times daily Patient taking differently: Apply 2 g topically 4 (four) times daily as needed (pain). Rub into affected area of foot 2 to 4 times daily 11/25/22  Yes Gershon Donnice SAUNDERS, DPM  latanoprost (XALATAN) 0.005 % ophthalmic solution Place 1 drop into both eyes at bedtime. 09/20/22  Yes [provider]  LINZESS  290 MCG CAPS capsule TAKE 1 CAPSULE EVERY DAY BEFORE BREAKFAST 03/26/24  Yes Ezzard Sonny GORMAN, PA-C  oxyCODONE  (OXY IR/ROXICODONE ) 5 MG immediate release tablet Take 5 mg by mouth every 4 (four) hours as needed for severe pain (pain score 7-10). 02/18/24   Yes [provider]  pantoprazole  (PROTONIX ) 40 MG tablet Take 1 tablet (40 mg total) by mouth daily. 12/02/23  Yes Luking, Glendia LABOR, MD  pravastatin  (PRAVACHOL ) 20 MG tablet TAKE 1 TABLET EVERY EVENING ( DOSE DECREASE ) 12/02/23  Yes Luking, Glendia LABOR, MD  sitaGLIPtin  (JANUVIA ) 50 MG tablet Take 1 tablet (50 mg total) by mouth daily. 12/02/23  Yes Alphonsa Glendia LABOR, MD  Blood Glucose Monitoring Suppl (PRODIGY AUTOCODE BLOOD GLUCOSE) w/Device KIT USE AS DIRECTED 02/21/24   Alphonsa Glendia LABOR, MD  PRODIGY NO CODING BLOOD GLUC test strip TEST BLOOD SUGAR AS INSTRUCTED 07/16/24   Alphonsa Glendia LABOR, MD  Prodigy Twist Top Lancets 28G MISC USE AS DIRECTED 02/13/24   Alphonsa Glendia LABOR, MD    Allergies: Ibuprofen , Advair diskus [fluticasone-salmeterol], Atorvastatin, Metformin  and related, and Penicillins    Review of Systems  Cardiovascular:  Positive for syncope.    Updated Vital Signs BP (!) 160/55   Pulse (!) 54   Temp 97.7 F (36.5 C)   Resp 16   Ht 5' 4 (1.626 m)   Wt 90.7 kg   SpO2 99%   BMI 34.33 kg/m   Physical Exam  (all labs ordered are listed, but only abnormal results are displayed) Labs Reviewed  COMPREHENSIVE METABOLIC PANEL WITH GFR - Abnormal; Notable for the following components:      Result Value   Glucose, Bld 128 (*)    All other components within  normal limits  CBC - Abnormal; Notable for the following components:   RBC 5.57 (*)    MCV 77.4 (*)    MCH 24.1 (*)    RDW 17.0 (*)    All other components within normal limits  URINALYSIS, ROUTINE W REFLEX MICROSCOPIC - Abnormal; Notable for the following components:   Color, Urine STRAW (*)    All other components within normal limits  CBG MONITORING, ED - Abnormal; Notable for the following components:   Glucose-Capillary 124 (*)    All other components within normal limits    EKG: EKG Interpretation Date/Time:  Tuesday July 17 2024 17:39:08 EST Ventricular Rate:  77 PR Interval:  172 QRS Duration:  141 QT  Interval:  422 QTC Calculation: 478 R Axis:   -73  Text Interpretation: Sinus rhythm RBBB and LAFB Confirmed by Suzette Pac [phone]) on 07/17/2024 6:01:15 PM  Radiology: No results found.  {Document cardiac monitor, telemetry assessment procedure when appropriate:32947} Procedures   Medications Ordered in the ED  sodium chloride  0.9 % bolus 500 mL (500 mLs Intravenous New Bag/Given 07/17/24 1922)      {Click here for ABCD2, HEART and other calculators REFRESH Note before signing:1}                              Medical Decision Making Amount and/or Complexity of Data Reviewed Labs: ordered.   Syncope and dehydration  {Document critical care time when appropriate  Document review of labs and clinical decision tools ie CHADS2VASC2, etc  Document your independent review of radiology images and any outside records  Document your discussion with family members, caretakers and with consultants  Document social determinants of health affecting pt's care  Document your decision making why or why not admission, treatments were needed:32947:::1}   Final diagnoses:  Syncope and collapse    ED Discharge Orders     None

## 2024-07-18 ENCOUNTER — Telehealth: Payer: Self-pay

## 2024-07-18 NOTE — Telephone Encounter (Signed)
 Patient has an appointment next week with Dr. Alphonsa 07/25/24.

## 2024-07-18 NOTE — Telephone Encounter (Signed)
 I would recommend a follow-up office visit with myself or one of the other providers as availability exists for this week if possible next week if not

## 2024-07-18 NOTE — Telephone Encounter (Signed)
 Copied from CRM [id number]. Topic: General - Other >> Jul 18, 2024  9:10 AM Carlyon D wrote: Reason for CRM: Arland pleasant the therapist with home health [facility] in [place], had to call 911 for pt yesterday, EMT made pt walk miss donna told them she can not walk and is so weak they did not listen to miss Arland,  EMT made still proceeded to not listen to miss Arland pt then got up tried walking and collapsed to the floor. Miss Arland stated EMT did not even check pt out did not do vitals did nothing pt cousin came and drove pt to ER wants to file reports on EMT company as they neglected to listen and care for pt correctly.   Big change in status of Pt  Weakness  Diagnosed with Anemia  Dizzy/ lightheaded   Miss Arland with [facility] home [facility] wants to make Dr. Alphonsa aware of this situation  Call back # for miss Arland [phone] ( secured line)

## 2024-07-19 ENCOUNTER — Ambulatory Visit: Payer: Self-pay

## 2024-07-19 ENCOUNTER — Other Ambulatory Visit: Payer: Self-pay | Admitting: *Deleted

## 2024-07-19 NOTE — Telephone Encounter (Signed)
 FYI Only or Action Required?: Action required by provider: request for appointment, clinical question for provider, and update on patient condition.  Patient was last seen in primary care on 06/28/2024 by Alphonsa Glendia LABOR, MD.  Called Nurse Triage reporting Weakness.  Symptoms began several days ago.  Interventions attempted: Rest, hydration, or home remedies and Other: ER evaluation.  Symptoms are: unchanged.  Triage Disposition: See Physician Within 24 Hours  Patient/caregiver understands and will follow disposition?: No, refuses disposition    Copied from CRM #[id number]. Topic: Clinical - Red Word Triage >> Jul 19, 2024  9:15 AM Darshell M wrote: Red Word that prompted transfer to Nurse Triage: Patient fell and was treated at the ER but did not receive imaging studies at the ER (See CRMs from 08/13/2024.) DX with dehydration and given fluids. Home Health suggest she have an iron injection.  Patient still experiencing weakness, no appetite. Not thriving. Patient says she recently had surgery on her liver and is experiencing pain in the area following fall. She has ER follow up 11/26 and iron injection 12/5. Reason for Disposition  [1] MODERATE weakness (e.g., interferes with work, school, normal activities) AND [2] persists > 3 days  Answer Assessment - Initial Assessment Questions Additional info:  Iron injection on Dec 5th, she feels she needs this sooner due to weakness.  Tuesday had syncope and evaluated in ER. Follow up appointment with pcp is scheduled on 07/25/24, she would like a sooner visit. This clinical research associate checked appointment and none are available with pcp sooner than 07/25/24, patient requesting work in visit for hospital follow up. Her main concern is she would like iron infusion before 08-03-24 and seeking pcp assistance. She declines offer of appointment with an alternate provider.   1. DESCRIPTION: Describe how you are feeling.     weakness 2. SEVERITY: How bad is it?  Can  you stand and walk?     Moderate 3. ONSET: When did these symptoms begin? (e.g., hours, days, weeks, months)     4 days 4. CAUSE: What do you think is causing the weakness or fatigue? (e.g., not drinking enough fluids, medical problem, trouble sleeping)     Dehydration and low iron 5. NEW MEDICINES:  Have you started on any new medicines recently? (e.g., opioid pain medicines, benzodiazepines, muscle relaxants, antidepressants, antihistamines, neuroleptics, beta blockers)      6. OTHER SYMPTOMS: Do you have any other symptoms? (e.g., chest pain, fever, cough, SOB, vomiting, diarrhea, bleeding, other areas of pain)     Poor appetite 7. PREGNANCY: Is there any chance you are pregnant? When was your last menstrual period?  Protocols used: Weakness (Generalized) and Fatigue-A-AH

## 2024-07-19 NOTE — Telephone Encounter (Signed)
 Spoke with patient and advised that Dr Glendia is not back in the office till 07/25/24 and she has an appointment scheduled with him on that day. Offered patient earlier office visit with one of the other providers in the office multiple times but patient declined and stated she wanted to keep her appointment with Dr Glendia. Patient advised to call back if she changes her mind and we can mover her appointment up with a provider in the office or go back to ER if worse.

## 2024-07-19 NOTE — Patient Outreach (Signed)
 Transition of Care week 5  Visit Note  07/19/2024  Name: [name] MRN: [id number]          DOB: Feb 03, 1946  Situation: Patient enrolled in [facility] 30-day program. Visit completed with patient by telephone.   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 mild CAD by cath in 2005 and 2012 b. low-risk NST's in 07/2016 and 05/2019   Cyst on liver   Depression    Diabetes mellitus    controlled by diet   Diverticulitis    DVT of axillary vein, acute right (HCC) 11/24/2012   Dx on Jan 28, 2012.     Dysrhythmia    Fibrocystic breast disease    Hepatic cyst    Hypertension    IBS (irritable bowel syndrome)    Impaired glucose tolerance    Menopause    Tachycardia     Assessment: Patient Reported Symptoms: Cognitive Cognitive Status: No symptoms reported, Alert and oriented to person, place, and time, Able to follow simple commands, Normal speech and language skills      Neurological Neurological Review of Symptoms: No symptoms reported    HEENT HEENT Symptoms Reported: No symptoms reported      Cardiovascular Cardiovascular Symptoms Reported: Fatigue Does patient have uncontrolled Hypertension?: No Cardiovascular Management Strategies: Adequate rest Cardiovascular Self-Management Outcome: 3 (uncertain) Cardiovascular Comment: pt states iron is low from bloodwork, will be going to cancer center 12/5 for possible iron infusion  Respiratory Respiratory Symptoms Reported: No symptoms reported    Endocrine Endocrine Symptoms Reported: No symptoms reported Is patient diabetic?: Yes Is patient checking blood sugars at home?: Yes List most recent blood sugar readings, include date and time of day: pt checks only on occasion, does not have reading to report Endocrine Self-Management Outcome: 4 (good)  Gastrointestinal  Gastrointestinal Symptoms Reported: No symptoms reported      Genitourinary Genitourinary Symptoms Reported: No symptoms reported    Integumentary Integumentary Symptoms Reported: Incision Additional Integumentary Details: incision to RUQ wnl per pt Skin Management Strategies: Adequate rest Skin Self-Management Outcome: 4 (good) Skin Comment: reinforced signs /symptoms of infection  Musculoskeletal Musculoskelatal Symptoms Reviewed: Limited mobility Additional Musculoskeletal Details: pt using cane Musculoskeletal Management Strategies: Adequate rest Musculoskeletal Self-Management Outcome: 4 (good) Musculoskeletal Comment: reinforced safety precautions, importance of working with PT      Psychosocial Psychosocial Symptoms Reported: No symptoms reported         There were no vitals filed for this visit. Pain Scale: 0-10 Pain Score: 0-No pain  Medications Reviewed Today     Reviewed by Aura Mliss LABOR, RN (Registered Nurse) on 07/19/24 at 1447  Med List Status: <None>   Medication Order Taking? Sig Documenting Provider Last Dose Status Informant  acetaminophen  (TYLENOL ) 500 MG tablet [id number] No Take 1,000 mg by mouth every 6 (six) hours as needed for mild pain, moderate pain or headache.  [provider] 07/17/2024 Morning Active Self, Pharmacy Records  amLODipine  (NORVASC ) 2.5 MG tablet [id number] No Take 1 tablet (2.5 mg total) by mouth daily. Alphonsa Glendia LABOR, MD 07/17/2024 Morning Active Self, Pharmacy Records  Blood Glucose Monitoring Suppl (PRODIGY AUTOCODE BLOOD GLUCOSE) w/Device KIT [id number]  USE AS DIRECTED Alphonsa Glendia LABOR, MD  Active Self, Pharmacy Records  Cholecalciferol (VITAMIN D-3) 125 MCG (5000 UT) TABS [id number] No Take 5,000 Units  by mouth daily. [provider] 07/17/2024 Morning Active Self, Pharmacy Records  clopidogrel  (PLAVIX ) 75 MG tablet [id number] No TAKE 1 TABLET EVERY DAY (NEED MD APPOINTMENT FOR REFILLS)  Patient taking differently:  Take 75 mg by mouth every evening. TAKE 1 TABLET EVERY DAY (NEED MD APPOINTMENT FOR REFILLS)   Alvan Dorn FALCON, MD 07/16/2024  9:45 PM Active Self, Pharmacy Records  diclofenac  Sodium (VOLTAREN ) 1 % GEL [id number] No Apply 2 g topically 4 (four) times daily. Rub into affected area of foot 2 to 4 times daily  Patient taking differently: Apply 2 g topically 4 (four) times daily as needed (pain). Rub into affected area of foot 2 to 4 times daily   Gershon Donnice SAUNDERS, DPM Past Month Active Self, Pharmacy Records  latanoprost (XALATAN) 0.005 % ophthalmic solution [id number] No Place 1 drop into both eyes at bedtime. [provider] 07/16/2024 Bedtime Active Self, Pharmacy Records  LINZESS  290 MCG CAPS capsule [id number] No TAKE 1 CAPSULE EVERY DAY BEFORE BREAKFAST Ezzard Sonny RAMAN, PA-C 07/17/2024 Morning Active Self, Pharmacy Records  oxyCODONE  (OXY IR/ROXICODONE ) 5 MG immediate release tablet [id number] No Take 5 mg by mouth every 4 (four) hours as needed for severe pain (pain score 7-10). [provider] Past Week Active Self, Pharmacy Records  pantoprazole  (PROTONIX ) 40 MG tablet [id number] No Take 1 tablet (40 mg total) by mouth daily. Alphonsa Glendia LABOR, MD 07/17/2024 Morning Active Self, Pharmacy Records  pravastatin  (PRAVACHOL ) 20 MG tablet [id number] No TAKE 1 TABLET EVERY EVENING ( DOSE DECREASE ) Alphonsa Glendia LABOR, MD 07/16/2024 Bedtime Active Self, Pharmacy Records  PRODIGY NO CODING BLOOD GLUC test strip [id number]  TEST BLOOD SUGAR AS INSTRUCTED Alphonsa Glendia LABOR, MD  Active Self, Pharmacy Records  Prodigy Twist Top Lancets 28G [place] [id number]  USE AS DIRECTED Alphonsa Glendia LABOR, MD  Active Self, Pharmacy Records  sitaGLIPtin  (JANUVIA ) 50 MG tablet [id number] No Take 1 tablet (50 mg total) by mouth daily. Alphonsa Glendia LABOR, MD 07/17/2024 Morning Active Self, Pharmacy Records  Med List Note (Ward, Angelica, CPhT 07/17/24 1815): Send long term meds to [facility] and send short term meds to [facility]            Goals Addressed             This Visit's Progress    COMPLETED: VBCI Transitions of Care (TOC) Care Plan       Problems:  Recent Hospitalization for treatment of Liver cyst Knowledge Deficit Related to Liver cyst Pt saw primary care provider 06/28/24, [facility] has been ordered and in process,  home health is working with pt 07/05/24- pt reports doing well except for being tired, I have low iron and will be getting an infusion at cancer center on 08/03/24   pt states nurse at primary care provider sent referral for [facility] but pt has not heard anything, home health PT continues working with pt 07/12/24- pt reports she worked really hard with PT yesterday and is sore today, BSC was delivered on 07/09/24, pt is now using a cane 07/19/24-pt reports she continues working with home health PT, continues using cane, pt states went to ED recently because I passed out, I was dehydrated  pt states she received IV fluids and does feel better, she is trying to drink adequate fluids throughout the day, pt states she is to follow up with surgeon at [facility] tomorrow 11/21 and primary care provider on 11/26  Goal:  Over the next 30 days, the patient  will not experience hospital readmission  Interventions:  Transitions of Care: Doctor Visits  - discussed the importance of doctor visits Post discharge activity limitations prescribed by provider reviewed Post-op wound/incision care reviewed with patient/caregiver Reviewed Signs and symptoms of infection Reinforced carbohydrate modified diet, importance of good blood sugar control Pain assessment completed Reviewed upcoming scheduled appointments Reviewed energy conservation, alternating activity with rest Instructed pt to drink adequate fluids, preferably water   Reviewed plan of care with pt including case closure  Patient Self Care Activities:  Attend all scheduled provider appointments Call pharmacy for medication refills 3-7 days in  advance of running out of medications Call provider office for new concerns or questions  Notify RN Care Manager of TOC call rescheduling needs Participate in Transition of Care Program/Attend TOC scheduled calls Take medications as prescribed   Please report any signs of infection to your surgeon Continue to work with physical therapist, complete prescribed exercises Case closure  Plan:  TOC case closure        Recommendation:   PCP Follow-up  Follow Up Plan:   Closing From:  Transitions of Care Program  Mliss Creed [facility], BSN RN Care Manager/ Transition of Care [place]/ [facility] [phone]

## 2024-07-19 NOTE — Patient Instructions (Signed)
 Visit Information  Thank you for taking time to visit with me today. Please don't hesitate to contact me if I can be of assistance to you before our next scheduled telephone appointment.  Our next appointment is no further scheduled appointments.    Following is a copy of your care plan:   Goals Addressed             This Visit's Progress    COMPLETED: VBCI Transitions of Care (TOC) Care Plan       Problems:  Recent Hospitalization for treatment of Liver cyst Knowledge Deficit Related to Liver cyst Pt saw primary care provider 06/28/24, [facility] has been ordered and in process,  home health is working with pt 07/05/24- pt reports doing well except for being tired, I have low iron and will be getting an infusion at cancer center on 08/03/24   pt states nurse at primary care provider sent referral for [facility] but pt has not heard anything, home health PT continues working with pt 07/12/24- pt reports she worked really hard with PT yesterday and is sore today, BSC was delivered on 07/09/24, pt is now using a cane 07/19/24-pt reports she continues working with home health PT, continues using cane, pt states went to ED recently because I passed out, I was dehydrated  pt states she received IV fluids and does feel better, she is trying to drink adequate fluids throughout the day, pt states she is to follow up with surgeon at [facility] tomorrow 11/21 and primary care provider on 11/26  Goal:  Over the next 30 days, the patient will not experience hospital readmission  Interventions:  Transitions of Care: Doctor Visits  - discussed the importance of doctor visits Post discharge activity limitations prescribed by provider reviewed Post-op wound/incision care reviewed with patient/caregiver Reviewed Signs and symptoms of infection Reinforced carbohydrate modified diet, importance of good blood sugar control Pain assessment completed Reviewed upcoming scheduled appointments Reviewed energy conservation,  alternating activity with rest Instructed pt to drink adequate fluids, preferably water   Reviewed plan of care with pt including case closure  Patient Self Care Activities:  Attend all scheduled provider appointments Call pharmacy for medication refills 3-7 days in advance of running out of medications Call provider office for new concerns or questions  Notify RN Care Manager of TOC call rescheduling needs Participate in Transition of Care Program/Attend TOC scheduled calls Take medications as prescribed   Please report any signs of infection to your surgeon Continue to work with physical therapist, complete prescribed exercises Case closure  Plan:  TOC case closure        Care plan and visit instructions communicated with the patient verbally today. The patient DECLINED to receive copy of care plan and patient instructions in any format.   No further follow up required: case closure  Please call the care guide team at [phone] if you need to cancel or reschedule your appointment.   Please call the Suicide and Crisis Lifeline: 988 call the [place]  National Suicide Prevention Lifeline: 937-273-9286 or TTY: 212-725-4912 TTY 5672585258) to talk to a trained counselor call 1-800-273-TALK (toll free, 24 hour hotline) go to [facility] [address], [place] [phone]) call 911 if you are experiencing a Mental Health or Behavioral Health Crisis or need someone to talk to.  Mliss Creed [facility], BSN RN Care Manager/ Transition of Care [place]/ [facility] [phone]

## 2024-07-20 ENCOUNTER — Other Ambulatory Visit: Payer: Self-pay | Admitting: Family Medicine

## 2024-07-20 DIAGNOSIS — R55 Syncope and collapse: Secondary | ICD-10-CM | POA: Diagnosis not present

## 2024-07-20 DIAGNOSIS — Z79899 Other long term (current) drug therapy: Secondary | ICD-10-CM | POA: Diagnosis not present

## 2024-07-20 DIAGNOSIS — Z86718 Personal history of other venous thrombosis and embolism: Secondary | ICD-10-CM | POA: Diagnosis not present

## 2024-07-20 DIAGNOSIS — R1011 Right upper quadrant pain: Secondary | ICD-10-CM | POA: Diagnosis not present

## 2024-07-20 DIAGNOSIS — I10 Essential (primary) hypertension: Secondary | ICD-10-CM | POA: Diagnosis not present

## 2024-07-20 DIAGNOSIS — Z880 Allergy status to penicillin: Secondary | ICD-10-CM | POA: Diagnosis not present

## 2024-07-20 DIAGNOSIS — K7689 Other specified diseases of liver: Secondary | ICD-10-CM | POA: Diagnosis not present

## 2024-07-20 DIAGNOSIS — E119 Type 2 diabetes mellitus without complications: Secondary | ICD-10-CM | POA: Diagnosis not present

## 2024-07-20 NOTE — Discharge Summary (Signed)
 ------------------------------------------------------------------------------- Attestation signed by Javier Pugh, MD at 07/24/24 1112 This patient was seen with the resident physician.   I have personally seen and examined the patient. This note attests to my responsibilities on this day as the attending surgeon. I am responsible for the surgical care of the patient. The resident has already seen the patient and documented his/her findings on the note below. I have reviewed the note. In addition, the flow sheet has been thoroughly reviewed for all the labs including chemistries, radiology, microbiology, and others. (If applicable) I have actively reviewed, managed, and/or adjusted the patient's medications   Total amount of time spent with the patient was 35 minutes. >50% of total time involved counseling or coordination of care. The time was spent performing some or all of the following activities during the 24-hour calendar day of the patient appointment:   1. Preparing to see the patient and reviewing tests and/or documents from outside facilities  2. Obtaining/performing and reviewing the history and physical examination 3. Counseling and educating the patient/family/caregiver 4. Ordering appropriate medications, tests, and procedures 5. Referring and communicating with other health care professionals 6. Documenting clinical information in the EHR 7. Independently interpreting results and communicating results to the patient/family/caregiver 8. Care coordination   Pugh Javier, MD  Abdominal Transplant & Hepatobiliary Surgery   -------------------------------------------------------------------------------   Discharge Summary  Admit date: 07/20/2024  Discharge date and time: 07/22/2024  Discharge to:  Home  Discharge Service: Surg [facility])  Discharge Attending Physician: Leanna Cristi Gore, MD  Discharge  Diagnoses: Fall  Secondary Diagnosis: Active  Problems:   * No active hospital problems. * Resolved Problems:   * No resolved hospital problems. *   OR Procedures:  none   Ancillary Procedures: no procedures  Discharge Day Services: The patient was seen and examined by the Transplant Surgery team on the day of discharge. Vital signs and laboratory values were stable and deemed appropriate for discharge. Surgical wounds were examined and found to be clean, dry, and intact. Discharge plan was discussed, instructions for home care were given, and all questions answered.  Subjective  No acute events overnight. Pain Controlled. No fever or chills.  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  Her mild RUQ pain/tenderness is most likely musculoskeletal in nature as described by the patient and worsens with particular movements.  Pain well-managed with p.o. Tylenol .  Objective  Patient Vitals for the past 8 hrs:  BP Temp Temp src Pulse Resp SpO2  07/22/24 0816 151/62 36.4 C (97.5 F) Oral 70 16 97 %  07/22/24 0808 151/62 -- -- -- -- --   I/O this shift: In: -  Out: 300 [Urine:300]  General Appearance:   No acute distress Lungs:                Clear to auscultation bilaterally Heart:                           Regular rate and rhythm Abdomen:                Soft, mild RUQ tenderness, non-distended Extremities:              Warm and well perfused  Incision: clean/dry/intact, well-healed  Hospital Course:  [name] is a 78 y.o. 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 Being  seen in clinic 07/20/2024 following a fall at home with new severe RUQ abd pain. Labs are stable in clinic on 07/20/24, will plan to admit to obs bed for CT A/P with contrast. Labs are stable today in clinic.  CT A/P showing postsurgical change not concerning for any acute abdominal process. Echocardiogram  obtained for patient's reported syncopal episode was within normal limits, with LVEF of 70%, mild to moderately increased LV wall thickness, and normal RV size and function. She will follow up in clinic as scheduled.  Condition at Discharge: Improved Discharge Medications:    Medication List    ASK your doctor about these medications   . acetaminophen  500 MG tablet; Commonly known as: TYLENOL  . amlodipine  2.5 MG tablet; Commonly known as: NORVASC  . BIOTIN ORAL . cholecalciferol (vitamin D3-125 mcg (5,000 unit)) 125 mcg (5,000 unit)  tablet . clopidogrel  75 mg tablet; Commonly known as: PLAVIX  . latanoprost 0.005 % ophthalmic solution; Commonly known as: XALATAN . LINZESS  290 mcg capsule; Generic drug: linaclotide  . oxyCODONE  5 MG immediate release tablet; Commonly known as: ROXICODONE ;  Take 1 tablet (5 mg total) by mouth every eight (8) hours as needed for  pain for up to 5 days.; Ask about: Should I take this medication? . pantoprazole  40 MG tablet; Commonly known as: Protonix  . pravastatin  20 MG tablet; Commonly known as: PRAVACHOL  . PROBIOTIC AND ACIDOPHILUS 300-250 million cell-mg Cap; Generic drug:  Lactobac comb 3-FOS-pantethine . PRODIGY NO CODING Strp; Generic drug: blood sugar diagnostic . SITagliptin  phosphate 50 MG tablet; Commonly known as: JANUVIA  . VOLTAREN  ARTHRITIS PAIN 1 % gel; Generic drug: diclofenac  sodium    Pending Test Results:   Discharge Instructions: Activity: see below  Diet: resume  Other Instructions: Other Instructions     Discharge instructions     Discharge Instructions: Activity: Do not lift > 10-15 lbs for 1st 6 weeks, then gradually increase to 25 lbs over the following 6 weeks. Resume heavy lifting only after being cleared to do so at follow-up appointment in Transplant Surgery clinic. This is to prevent an incisional hernia from occurring.   Diet: Resume  Other Instructions: follow up in clinic as scheduled   Your Post-Coordinator is  Neville Finder. Contact your coordinator or the Transplant Surgery Office [phone]) during business hours or call the hospital operator and ask for the transplant surgery resident on call or the endocrinology fellow on call [phone]) after business hours for:  - fever >100.5 degrees F by mouth, any fever with shaking chills, or other signs or symptoms of infection  - uncontrolled nausea, vomiting, or diarrhea; inability to have a bowel movement for > 3 days.  - any problem that prevents taking medications as scheduled.  - pain uncontrolled with prescribed medication or new pain or tenderness at the surgical site  - sudden weight gain or increase in blood pressure (greater than 140/85)  - shortness of breath, chest pain / discomfort  - new or increasing jaundice  - urinary symptoms including pain / difficulty / burning or tea-colored urine  - any other new or concerning symptoms  - questions regarding your medications or continuing care   Patient may shower, but should not immerse wounds in bath or pool for 2-3 weeks. Wash the surgical site with mild soap and water , but do not scrub vigorously.  You may dress wounds with dry gauze and tape to avoid soilage.  Do not drive or operate heavy machinery prior to MD clearance, or at any time while taking  narcotics.  Inspect surgical sites at least twice daily, contact Coordinator for spreading redness, purulent discharge, or increasing bleeding or drainage, or for separation of wounds.    Hepatobiliary Coordinator: Neville Finder- phone: [phone] fax: [phone]      Labs and Other Follow-ups after Discharge: Follow Up instructions and Outpatient Referrals    Discharge instructions       Future Appointments:

## 2024-07-21 DIAGNOSIS — I517 Cardiomegaly: Secondary | ICD-10-CM | POA: Diagnosis not present

## 2024-07-21 DIAGNOSIS — Z86718 Personal history of other venous thrombosis and embolism: Secondary | ICD-10-CM | POA: Diagnosis not present

## 2024-07-21 DIAGNOSIS — K7689 Other specified diseases of liver: Secondary | ICD-10-CM | POA: Diagnosis not present

## 2024-07-21 DIAGNOSIS — Z79899 Other long term (current) drug therapy: Secondary | ICD-10-CM | POA: Diagnosis not present

## 2024-07-21 DIAGNOSIS — R55 Syncope and collapse: Secondary | ICD-10-CM | POA: Diagnosis not present

## 2024-07-21 DIAGNOSIS — R1011 Right upper quadrant pain: Secondary | ICD-10-CM | POA: Diagnosis not present

## 2024-07-21 DIAGNOSIS — Z880 Allergy status to penicillin: Secondary | ICD-10-CM | POA: Diagnosis not present

## 2024-07-21 DIAGNOSIS — E119 Type 2 diabetes mellitus without complications: Secondary | ICD-10-CM | POA: Diagnosis not present

## 2024-07-21 DIAGNOSIS — I10 Essential (primary) hypertension: Secondary | ICD-10-CM | POA: Diagnosis not present

## 2024-07-22 DIAGNOSIS — Z880 Allergy status to penicillin: Secondary | ICD-10-CM | POA: Diagnosis not present

## 2024-07-22 DIAGNOSIS — K7689 Other specified diseases of liver: Secondary | ICD-10-CM | POA: Diagnosis not present

## 2024-07-22 DIAGNOSIS — I10 Essential (primary) hypertension: Secondary | ICD-10-CM | POA: Diagnosis not present

## 2024-07-22 DIAGNOSIS — Z86718 Personal history of other venous thrombosis and embolism: Secondary | ICD-10-CM | POA: Diagnosis not present

## 2024-07-22 DIAGNOSIS — Z79899 Other long term (current) drug therapy: Secondary | ICD-10-CM | POA: Diagnosis not present

## 2024-07-22 DIAGNOSIS — E119 Type 2 diabetes mellitus without complications: Secondary | ICD-10-CM | POA: Diagnosis not present

## 2024-07-22 DIAGNOSIS — R55 Syncope and collapse: Secondary | ICD-10-CM | POA: Diagnosis not present

## 2024-07-22 DIAGNOSIS — R1011 Right upper quadrant pain: Secondary | ICD-10-CM | POA: Diagnosis not present

## 2024-07-23 ENCOUNTER — Telehealth: Payer: Self-pay

## 2024-07-23 DIAGNOSIS — I10 Essential (primary) hypertension: Secondary | ICD-10-CM | POA: Diagnosis not present

## 2024-07-23 DIAGNOSIS — K760 Fatty (change of) liver, not elsewhere classified: Secondary | ICD-10-CM | POA: Diagnosis not present

## 2024-07-23 DIAGNOSIS — K7689 Other specified diseases of liver: Secondary | ICD-10-CM | POA: Diagnosis not present

## 2024-07-23 DIAGNOSIS — E1169 Type 2 diabetes mellitus with other specified complication: Secondary | ICD-10-CM | POA: Diagnosis not present

## 2024-07-23 DIAGNOSIS — K750 Abscess of liver: Secondary | ICD-10-CM | POA: Diagnosis not present

## 2024-07-23 DIAGNOSIS — E1140 Type 2 diabetes mellitus with diabetic neuropathy, unspecified: Secondary | ICD-10-CM | POA: Diagnosis not present

## 2024-07-23 DIAGNOSIS — K219 Gastro-esophageal reflux disease without esophagitis: Secondary | ICD-10-CM | POA: Diagnosis not present

## 2024-07-23 DIAGNOSIS — I2510 Atherosclerotic heart disease of native coronary artery without angina pectoris: Secondary | ICD-10-CM | POA: Diagnosis not present

## 2024-07-23 DIAGNOSIS — E785 Hyperlipidemia, unspecified: Secondary | ICD-10-CM | POA: Diagnosis not present

## 2024-07-23 NOTE — Transitions of Care (Post Inpatient/ED Visit) (Signed)
   07/23/2024  Name: [name] MRN: [id number] DOB: April 05, 1946  Today's TOC FU Call Status: Today's TOC FU Call Status:: Unsuccessful Call (1st Attempt) Unsuccessful Call (1st Attempt) Date: 07/23/24  Attempted to reach the patient regarding the most recent Inpatient/ED visit.  Follow Up Plan: Additional outreach attempts will be made to reach the patient to complete the Transitions of Care (Post Inpatient/ED visit) call.   Signature  Charmaine Bloodgood, LPN [facility] Health Advisor [place] [facility] You Are. We Are. One [facility] Direct Dial [phone]

## 2024-07-23 NOTE — Telephone Encounter (Signed)
 Patient is returning Courtney's call. Number for Charmaine is not correct.

## 2024-07-25 ENCOUNTER — Encounter: Payer: Self-pay | Admitting: Family Medicine

## 2024-07-25 ENCOUNTER — Ambulatory Visit: Admitting: Family Medicine

## 2024-07-25 VITALS — BP 143/78 | HR 84 | Temp 97.7°F | Ht 64.0 in | Wt 241.0 lb

## 2024-07-25 DIAGNOSIS — R110 Nausea: Secondary | ICD-10-CM | POA: Diagnosis not present

## 2024-07-25 DIAGNOSIS — R1011 Right upper quadrant pain: Secondary | ICD-10-CM

## 2024-07-25 DIAGNOSIS — R6881 Early satiety: Secondary | ICD-10-CM

## 2024-07-25 MED ORDER — ONDANSETRON 8 MG PO TBDP: 8.0000 mg | ORAL_TABLET | Freq: Three times a day (TID) | ORAL | 1 refills | Status: AC | PRN

## 2024-07-25 MED ORDER — HYDROCODONE-ACETAMINOPHEN 5-325 MG PO TABS: 1.0000 | ORAL_TABLET | Freq: Four times a day (QID) | ORAL | 0 refills | Status: AC | PRN

## 2024-07-25 NOTE — Progress Notes (Addendum)
 "  Acute Office Visit  Subjective:     Patient ID: [name], female    DOB: 06-05-1946, 78 y.o.   MRN: [id number]  HPI Patient is in today for a follow up after hospitalization for a fall and severe RUQ pain, and was admitted at [facility] from 07/20/24-07/22/24. Viewed notes from 07/20/24 encounter. Began having early satiety, diarrhea, loss of appetite after being in the hospital for a few days, and it has not improved since returning home. Was taking oxycodone  in the hospital. Is only able to eat soup, and eats once per day. Reports nausea with meals and no appetite. Reports that she was given a prescription of oxycodone  to take for pain at home, and has been taking every 6 hours, but not exceeding two pills per day, and it has not improved pain. Has been taking tylenol  500 mg for pain, and reports that it does help. Had low iron  in hospital labs, and plans to get iron  infusion at [facility] 08/03/24.   Review of Systems  Constitutional:  Positive for malaise/fatigue. Negative for fever.       Positive for loss of appetite.   Respiratory:  Negative for cough, shortness of breath and wheezing.   Cardiovascular:  Negative for chest pain and leg swelling.  Gastrointestinal:  Positive for abdominal pain, diarrhea and vomiting.       Positive for early satiety.       Objective:    Today's Vitals   07/25/24 1521  BP: (!) 143/78  Pulse: 84  Temp: 97.7 F (36.5 C)  SpO2: 100%  Weight: 241 lb (109.3 kg)  Height: 5' 4 (1.626 m)   Body mass index is 41.37 kg/m.   Physical Exam Vitals and nursing note reviewed.  Constitutional:      General: She is not in acute distress.    Appearance: Normal appearance. She is not ill-appearing.  Cardiovascular:     Rate and Rhythm: Normal rate and regular rhythm.     Heart sounds: Normal heart sounds. No murmur heard. Pulmonary:     Effort: Pulmonary effort is normal. No respiratory distress.     Breath sounds: Normal breath sounds. No  wheezing.  Abdominal:     Palpations: Abdomen is soft.     Tenderness: There is abdominal tenderness in the right upper quadrant.     Comments: No obvious masses or abnormalities palpated on exam.   Neurological:     Mental Status: She is alert.  Psychiatric:        Mood and Affect: Mood normal.        Behavior: Behavior normal.        Thought Content: Thought content normal.        Judgment: Judgment normal.   Viewed previous note from [facility] on 07/20/24.     Assessment & Plan:  1. RUQ pain (Primary) -Discussed with patient about stopping oxycodone  due to ineffectiveness. Patient had scans during hospitalization. Have low suspicion for anything acute. Encouraged patient to take tylenol  as needed for pain, and reserve hydrocodone  when pain is unbearable.  -Educated patient to watch for drowsiness with hydrocodone , and no driving while on the medication.  - HYDROcodone -acetaminophen  (NORCO/VICODIN) 5-325 MG tablet; Take 1 tablet by mouth every 6 (six) hours as needed for up to 5 days.  Dispense: 20 tablet; Refill: 0  2. Early satiety -Advised patient to prioritize eating small, frequent meals that are balanced with carbohydrates and protein. Due to being hospitalized and pain medication,  I think she is having decreased gastric motility. Encouraged to increase food intake as tolerated.   3. Nausea without vomiting  - ondansetron  (ZOFRAN -ODT) 8 MG disintegrating tablet; Take 1 tablet (8 mg total) by mouth every 8 (eight) hours as needed for nausea or vomiting.  Dispense: 15 tablet; Refill: 1   Return in about 2 weeks (around 08/08/2024).  Damien KATHEE Pringle, FNP  Addendum:  Patient was seen at [facility] on 07/25/2024 under the supervision of Glendia Fielding, MD. Originally entered in error.   Thanks, Damien Pringle, FNP-BC   "

## 2024-07-25 NOTE — Progress Notes (Signed)
   Subjective:    Patient ID: [name], female    DOB: 1946/08/25, 78 y.o.   MRN: [id number]  HPI Hospital follow up For elevated BP and low HR- did CT scan, Us  of the heart, labs , showed low iron  Pt was dropped by EMS when she passed out and was bruised in her surgical areas - pt c/o ABD pain , early satiety, vomiting, diarrhea   Review of Systems     Objective:   Physical Exam General-in no acute distress Eyes-no discharge Lungs-respiratory rate normal, CTA CV-no murmurs,RRR Extremities skin warm dry no edema Neuro grossly normal Behavior normal, alert        Assessment & Plan:  1. RUQ pain (Primary) Recent fall contributed to her aches and discomfort in her hip region left side as well as right upper quadrant CAT scan did not show any hematoma more likely scar tissue No need for any type of major interventions other than gentle range of motion and movement should gradually get better over the next 4 to 6 weeks follow-up sooner if problems  Stop oxycodone  I feel it is causing too many side effects with this patient recommend hydrocodone  when necessary caution drowsiness use and frequently - HYDROcodone -acetaminophen  (NORCO/VICODIN) 5-325 MG tablet; Take 1 tablet by mouth every 6 (six) hours as needed for up to 5 days.  Dispense: 20 tablet; Refill: 0  2. Early satiety Encouraged her to eat small meals multiple times per day mixing up carbohydrates as well as proteins  3. Nausea without vomiting Zofran  for nausea healthy diet - ondansetron  (ZOFRAN -ODT) 8 MG disintegrating tablet; Take 1 tablet (8 mg total) by mouth every 8 (eight) hours as needed for nausea or vomiting.  Dispense: 15 tablet; Refill: 1 If worsening trouble let us  know otherwise follow-up several weeks and do a regular follow-up visit with myself in several months

## 2024-07-30 DIAGNOSIS — K219 Gastro-esophageal reflux disease without esophagitis: Secondary | ICD-10-CM | POA: Diagnosis not present

## 2024-07-30 DIAGNOSIS — E1169 Type 2 diabetes mellitus with other specified complication: Secondary | ICD-10-CM | POA: Diagnosis not present

## 2024-07-30 DIAGNOSIS — E1140 Type 2 diabetes mellitus with diabetic neuropathy, unspecified: Secondary | ICD-10-CM | POA: Diagnosis not present

## 2024-07-30 DIAGNOSIS — K760 Fatty (change of) liver, not elsewhere classified: Secondary | ICD-10-CM | POA: Diagnosis not present

## 2024-07-30 DIAGNOSIS — I10 Essential (primary) hypertension: Secondary | ICD-10-CM | POA: Diagnosis not present

## 2024-07-30 DIAGNOSIS — K7689 Other specified diseases of liver: Secondary | ICD-10-CM | POA: Diagnosis not present

## 2024-07-30 DIAGNOSIS — K750 Abscess of liver: Secondary | ICD-10-CM | POA: Diagnosis not present

## 2024-07-30 DIAGNOSIS — E785 Hyperlipidemia, unspecified: Secondary | ICD-10-CM | POA: Diagnosis not present

## 2024-08-03 ENCOUNTER — Inpatient Hospital Stay: Admitting: Hematology

## 2024-08-03 ENCOUNTER — Inpatient Hospital Stay

## 2024-08-12 NOTE — Progress Notes (Unsigned)
 [facility]  INITIAL CONSULT NOTE  Patient Care Team: Alphonsa Glendia LABOR, MD as PCP - General (Family Medicine) Alvan, Dorn FALCON, MD as PCP - Cardiology (Cardiology) Cindie Carlin POUR, DO as Consulting Physician (Internal Medicine)  Hematological/Oncological History Prior iron  studies: 03/15/2014: Iron  63, TIBC 388 12/26/2014: Iron  50, saturation 13% (L), TIBC 400 06/28/2024: Iron  30, Ferritin 100, iron  saturation 8% (L), TIBC 383, Hgb 13.0, MCV 80, Plt 511 08/13/2024: Establish care with [facility]  CHIEF COMPLAINTS/PURPOSE OF CONSULTATION:  Iron  deficiency without anemia  HISTORY OF PRESENTING ILLNESS:  [name] 78 y.o. female with medical history significant for hypertension, irritable bowel syndrome and diabetes presents to the hematology clinic for evaluation for iron  deficiency without anemia.  She is unaccompanied for this visit.  On exam today, [name] reports that she has ongoing fatigue which does impact her ADLs.  She reports her appetite is fair but weight has been stable for the last few weeks.  She does have intermittent episodes of fatigue and was recently prescribed Zofran  that she plans to start in the near future.  She denies any vomiting or abdominal pain.  She reports having postprandial bowel movements within 10 to 15 minutes after eating.  She reports the stools are initially formed but then turned watery.  She denies easy bruising or overt signs of bleeding such as hematochezia, melena, hematuria, nosebleeding or gum bleeding.  She reports her diet is normal but does not eat a lot of meat.  She reports having intermittent episodes of dizziness but no syncopal episodes.  She does have occasional headaches that resolve on its own.  She denies fevers, chills, sweats, shortness of breath, chest pain, cough. Rest of the 10 point ROS as below.  MEDICAL HISTORY:  Past Medical History:  Diagnosis Date   Blood clot of artery under arm  (HCC) 2017   Bronchitis    Chest pain    a. mild CAD by cath in 2005 and 2012 b. low-risk NST's in 07/2016 and 05/2019   Cyst on liver   Depression    Diabetes mellitus    controlled by diet   Diverticulitis    DVT of axillary vein, acute right (HCC) 11/24/2012   Dx on Jan 28, 2012.     Dysrhythmia    Fibrocystic breast disease    Hepatic cyst    Hypertension    IBS (irritable bowel syndrome)    Impaired glucose tolerance    Menopause    Tachycardia     SURGICAL HISTORY: Past Surgical History:  Procedure Laterality Date   ABDOMINAL HYSTERECTOMY     BACK SURGERY     blood clot Right 2014   arm   BREAST SURGERY  right   cyst removed   CARDIAC CATHETERIZATION  2010   with stent   CAST APPLICATION  02/14/2012   Procedure: CAST APPLICATION;  Surgeon: Taft FORBES Minerva, MD;  Location: [facility];  Service: Orthopedics;  Laterality: Right;  procedure room    CHOLECYSTECTOMY     COLONOSCOPY  12/04/2004   NUR:Few small diverticula at sigmoid colon/Small cecal polyp ablated    COLONOSCOPY N/A 09/24/2014   SLF: small internal hemorrhoids   COLONOSCOPY N/A 02/15/2020   Procedure: COLONOSCOPY;  Surgeon: Shaaron Lamar HERO, MD;  Location: [facility];  Service: Endoscopy;  Laterality: N/A;  8:30am   COLONOSCOPY WITH PROPOFOL  N/A 01/17/2023   Procedure: COLONOSCOPY WITH PROPOFOL ;  Surgeon: Cindie Carlin POUR, DO;  Location: [facility];  Service: Endoscopy;  Laterality: N/A;  1:00 pm, ASA 3   COLONOSCOPY WITH PROPOFOL  N/A 10/10/2023   Procedure: COLONOSCOPY WITH PROPOFOL ;  Surgeon: Cindie Carlin POUR, DO;  Location: [facility];  Service: Endoscopy;  Laterality: N/A;  1:00 pm, asa 3   ESOPHAGOGASTRODUODENOSCOPY N/A 02/02/2016   Procedure: ESOPHAGOGASTRODUODENOSCOPY (EGD);  Surgeon: Margo LITTIE Haddock, MD;  Location: [facility];  Service: Endoscopy;  Laterality: N/A;  930    EXTERNAL FIXATION ANKLE FRACTURE     KNEE ARTHROCENTESIS  left   POLYPECTOMY  02/15/2020   Procedure:  POLYPECTOMY;  Surgeon: Shaaron Lamar HERO, MD;  Location: [facility];  Service: Endoscopy;;   POLYPECTOMY  01/17/2023   Procedure: POLYPECTOMY;  Surgeon: Cindie Carlin POUR, DO;  Location: [facility];  Service: Endoscopy;;   POLYPECTOMY  10/10/2023   Procedure: POLYPECTOMY;  Surgeon: Cindie Carlin POUR, DO;  Location: [facility];  Service: Endoscopy;;   TUBAL LIGATION      SOCIAL HISTORY: Social History   Socioeconomic History   Marital status: Widowed    Spouse name: Not on file   Number of children: 2   Years of education: Not on file   Highest education level: Not on file  Occupational History   Occupation: Retired  Tobacco Use   Smoking status: Never   Smokeless tobacco: Never   Tobacco comments:    Never smoked  Vaping Use   Vaping status: Never Used  Substance and Sexual Activity   Alcohol use: No    Alcohol/week: 0.0 standard drinks of alcohol   Drug use: No   Sexual activity: Not Currently  Other Topics Concern   Not on file  Social History Narrative   1 son and 1 daughter live close by.   Social Drivers of Health   Tobacco Use: Low Risk (08/13/2024)   Patient History    Smoking Tobacco Use: Never    Smokeless Tobacco Use: Never    Passive Exposure: Not on file  Financial Resource Strain: Low Risk (07/21/2024)   Received from [facility]   Overall Financial Resource Strain (CARDIA)    How hard is it for you to pay for the very basics like food, housing, medical care, and heating?: Not hard at all  Food Insecurity: No Food Insecurity (08/13/2024)   Epic    Worried About Radiation Protection Practitioner of Food in the Last Year: Never true    Ran Out of Food in the Last Year: Never true  Transportation Needs: No Transportation Needs (08/13/2024)   Epic    Lack of Transportation (Medical): No    Lack of Transportation (Non-Medical): No  Physical Activity: Not on file  Stress: Not on file  Social Connections: Not on file  Intimate Partner Violence: Not At Risk  (08/13/2024)   Epic    Fear of Current or Ex-Partner: No    Emotionally Abused: No    Physically Abused: No    Sexually Abused: No  Depression (PHQ2-9): Medium Risk (08/13/2024)   Depression (PHQ2-9)    PHQ-2 Score: 7  Alcohol Screen: Not on file  Housing: Low Risk (08/13/2024)   Epic    Unable to Pay for Housing in the Last Year: No    Number of Times Moved in the Last Year: 0    Homeless in the Last Year: No  Utilities: Not At Risk (08/13/2024)   Epic    Threatened with loss of utilities: No  Health Literacy: Not on file    FAMILY  HISTORY: Family History  Problem Relation Age of Onset   Stroke Mother    Heart attack Mother    Breast cancer Sister    Diabetes Sister    Seizures Brother    Diabetes Brother    Heart disease Brother    Kidney disease Son    Colon cancer Neg Hx     ALLERGIES:  is allergic to ibuprofen , advair diskus [fluticasone-salmeterol], atorvastatin, metformin  and related, and penicillins.  MEDICATIONS:  Current Outpatient Medications  Medication Sig Dispense Refill   acetaminophen  (TYLENOL ) 500 MG tablet Take 1,000 mg by mouth every 6 (six) hours as needed for mild pain, moderate pain or headache.      amLODipine  (NORVASC ) 2.5 MG tablet Take 1 tablet (2.5 mg total) by mouth daily. 30 tablet 3   Blood Glucose Monitoring Suppl (PRODIGY AUTOCODE BLOOD GLUCOSE) w/Device KIT USE AS DIRECTED 1 kit 3   Cholecalciferol (VITAMIN D-3) 125 MCG (5000 UT) TABS Take 5,000 Units by mouth daily.     clopidogrel  (PLAVIX ) 75 MG tablet TAKE 1 TABLET EVERY DAY (NEED MD APPOINTMENT FOR REFILLS) (Patient taking differently: Take 75 mg by mouth every evening. TAKE 1 TABLET EVERY DAY (NEED MD APPOINTMENT FOR REFILLS)) 90 tablet 3   diclofenac  Sodium (VOLTAREN ) 1 % GEL Apply 2 g topically 4 (four) times daily. Rub into affected area of foot 2 to 4 times daily (Patient taking differently: Apply 2 g topically 4 (four) times daily as needed (pain). Rub into affected area of foot  2 to 4 times daily) 100 g 2   latanoprost (XALATAN) 0.005 % ophthalmic solution Place 1 drop into both eyes at bedtime.     lidocaine  (LIDODERM ) 5 % 1 patch daily. Using as needed     LINZESS  290 MCG CAPS capsule TAKE 1 CAPSULE EVERY DAY BEFORE BREAKFAST 90 capsule 3   ondansetron  (ZOFRAN -ODT) 8 MG disintegrating tablet Take 1 tablet (8 mg total) by mouth every 8 (eight) hours as needed for nausea or vomiting. 15 tablet 1   pantoprazole  (PROTONIX ) 40 MG tablet Take 1 tablet (40 mg total) by mouth daily. 90 tablet 3   pravastatin  (PRAVACHOL ) 20 MG tablet TAKE 1 TABLET EVERY EVENING ( DOSE DECREASE ) 90 tablet 3   PRODIGY NO CODING BLOOD GLUC test strip TEST BLOOD SUGAR AS INSTRUCTED 100 strip 3   Prodigy Twist Top Lancets 28G MISC USE AS DIRECTED 100 each 3   sitaGLIPtin  (JANUVIA ) 50 MG tablet Take 1 tablet (50 mg total) by mouth daily. 90 tablet 3   No current facility-administered medications for this visit.    REVIEW OF SYSTEMS:   Constitutional: ( - ) fevers, ( - )  chills , ( - ) night sweats Eyes: ( - ) blurriness of vision, ( - ) double vision, ( - ) watery eyes Ears, nose, mouth, throat, and face: ( - ) mucositis, ( - ) sore throat Respiratory: ( - ) cough, ( - ) dyspnea, ( - ) wheezes Cardiovascular: ( - ) palpitation, ( - ) chest discomfort, ( - ) lower extremity swelling Gastrointestinal:  ( - ) nausea, ( - ) heartburn, ( - ) change in bowel habits Skin: ( - ) abnormal skin rashes Lymphatics: ( - ) new lymphadenopathy, ( - ) easy bruising Neurological: ( - ) numbness, ( - ) tingling, ( - ) new weaknesses Behavioral/Psych: ( - ) mood change, ( - ) new changes  All other systems were reviewed with the patient and are negative.  PHYSICAL EXAMINATION: ECOG PERFORMANCE STATUS: 1 - Symptomatic but completely ambulatory  Vitals:   08/13/24 1119  BP: (!) 140/49  Pulse: (!) 55  Resp: 18  Temp: (!) 97.2 F (36.2 C)  SpO2: 100%   Filed Weights   08/13/24 1119  Weight: 241 lb  (109.3 kg)    GENERAL: well appearing female in NAD  SKIN: skin color, texture, turgor are normal, no rashes or significant lesions EYES: sclera clear. Conjunctival pallor noted.  OROPHARYNX: no exudate, no erythema; lips, buccal mucosa, and tongue normal  NECK: supple, non-tender LUNGS: clear to auscultation and percussion with normal breathing effort HEART: regular rate & rhythm and no murmurs and no lower extremity edema Musculoskeletal: no cyanosis of digits and no clubbing  PSYCH: alert & oriented x 3, fluent speech NEURO: no focal motor/sensory deficits  LABORATORY DATA:  I have reviewed the data as listed    Latest Ref Rng & Units 07/17/2024    5:55 PM 06/28/2024   11:41 AM 03/14/2024   11:05 AM  CBC  WBC 4.0 - 10.5 K/uL 6.6  7.9  5.8   Hemoglobin 12.0 - 15.0 g/dL 86.5  86.9  86.2   Hematocrit 36.0 - 46.0 % 43.1  43.9  46.7   Platelets 150 - 400 K/uL 380  511  334        Latest Ref Rng & Units 07/17/2024    5:55 PM 06/28/2024   11:41 AM 03/14/2024   11:05 AM  CMP  Glucose 70 - 99 mg/dL 871  894    BUN 8 - 23 mg/dL 16  13    Creatinine 9.55 - 1.00 mg/dL 9.16  9.07    Sodium 864 - 145 mmol/L 139  141    Potassium 3.5 - 5.1 mmol/L 4.3  4.8    Chloride 98 - 111 mmol/L 101  101    CO2 22 - 32 mmol/L 25  23    Calcium 8.9 - 10.3 mg/dL 89.8  89.9    Total Protein 6.5 - 8.1 g/dL 7.7   6.8   Total Bilirubin 0.0 - 1.2 mg/dL 0.3   0.4   Alkaline Phos 38 - 126 U/L 91   90   AST 15 - 41 U/L 22   21   ALT 0 - 44 U/L 21   18     ASSESSMENT & PLAN AURIANA SCALIA is a 78 y.o. female who presents to the clinic for evaluation of iron  deficiency without anemia.   #Iron  deficiency without anemia: --Etiology unknown but could be related to malabsorption with patient's ongoing GI issues. Will request follow up with GI to further evaluate. --Most recent colonoscopy from Feb 2025 showed no evidence of bleeding --Labs today to check CBC, CMP, retic panel, ferritin, iron  and  TIBC levels --Will avoid PO iron  therapy due to question of malabsorption --Plan for IV venofer  200 mg weekly x 3 doses --RTC in 8 weeks with labs and APP visit.   Orders Placed This Encounter  Procedures   Iron  and TIBC ([facility] ONLY)    Standing Status:   Future    Expected Date:   08/13/2024    Expiration Date:   11/11/2024   Ferritin    Standing Status:   Future    Expected Date:   08/13/2024    Expiration Date:   11/11/2024   CBC with Differential    Standing Status:   Future    Expected Date:   08/13/2024  Expiration Date:   11/11/2024   Comprehensive metabolic panel    Standing Status:   Future    Expected Date:   08/13/2024    Expiration Date:   11/11/2024   Retic Panel    Standing Status:   Future    Expected Date:   08/13/2024    Expiration Date:   11/11/2024    All questions were answered. The patient knows to call the clinic with any problems, questions or concerns.  I have spent a total of 60 minutes minutes of face-to-face and non-face-to-face time, preparing to see the patient, obtaining and/or reviewing separately obtained history, performing a medically appropriate examination, counseling and educating the patient, ordering medications/tests/procedures, referring and communicating with other health care professionals, documenting clinical information in the electronic health record, independently interpreting results and communicating results to the patient, and care coordination.   Johnston Police, PA-C Department of Hematology/Oncology [facility] at [facility]

## 2024-08-13 ENCOUNTER — Inpatient Hospital Stay

## 2024-08-13 ENCOUNTER — Ambulatory Visit

## 2024-08-13 ENCOUNTER — Inpatient Hospital Stay: Attending: Physician Assistant | Admitting: Physician Assistant

## 2024-08-13 ENCOUNTER — Encounter: Payer: Self-pay | Admitting: Physician Assistant

## 2024-08-13 VITALS — BP 127/61 | HR 77 | Temp 97.5°F | Ht 64.0 in | Wt 241.4 lb

## 2024-08-13 VITALS — BP 140/49 | HR 55 | Temp 97.2°F | Resp 18 | Ht 64.0 in | Wt 241.0 lb

## 2024-08-13 DIAGNOSIS — Z803 Family history of malignant neoplasm of breast: Secondary | ICD-10-CM | POA: Insufficient documentation

## 2024-08-13 DIAGNOSIS — Z6841 Body Mass Index (BMI) 40.0 and over, adult: Secondary | ICD-10-CM

## 2024-08-13 DIAGNOSIS — E611 Iron deficiency: Secondary | ICD-10-CM | POA: Insufficient documentation

## 2024-08-13 DIAGNOSIS — R1011 Right upper quadrant pain: Secondary | ICD-10-CM | POA: Diagnosis not present

## 2024-08-13 DIAGNOSIS — Z8249 Family history of ischemic heart disease and other diseases of the circulatory system: Secondary | ICD-10-CM | POA: Insufficient documentation

## 2024-08-13 DIAGNOSIS — Z79899 Other long term (current) drug therapy: Secondary | ICD-10-CM | POA: Insufficient documentation

## 2024-08-13 DIAGNOSIS — Z90710 Acquired absence of both cervix and uterus: Secondary | ICD-10-CM | POA: Insufficient documentation

## 2024-08-13 DIAGNOSIS — D509 Iron deficiency anemia, unspecified: Secondary | ICD-10-CM

## 2024-08-13 DIAGNOSIS — K7689 Other specified diseases of liver: Secondary | ICD-10-CM | POA: Insufficient documentation

## 2024-08-13 DIAGNOSIS — K589 Irritable bowel syndrome without diarrhea: Secondary | ICD-10-CM | POA: Diagnosis not present

## 2024-08-13 DIAGNOSIS — E119 Type 2 diabetes mellitus without complications: Secondary | ICD-10-CM | POA: Diagnosis not present

## 2024-08-13 DIAGNOSIS — Z880 Allergy status to penicillin: Secondary | ICD-10-CM | POA: Insufficient documentation

## 2024-08-13 DIAGNOSIS — Z886 Allergy status to analgesic agent status: Secondary | ICD-10-CM | POA: Insufficient documentation

## 2024-08-13 DIAGNOSIS — Z86718 Personal history of other venous thrombosis and embolism: Secondary | ICD-10-CM | POA: Insufficient documentation

## 2024-08-13 DIAGNOSIS — Z780 Asymptomatic menopausal state: Secondary | ICD-10-CM | POA: Diagnosis not present

## 2024-08-13 DIAGNOSIS — I10 Essential (primary) hypertension: Secondary | ICD-10-CM | POA: Insufficient documentation

## 2024-08-13 DIAGNOSIS — R519 Headache, unspecified: Secondary | ICD-10-CM | POA: Diagnosis not present

## 2024-08-13 DIAGNOSIS — Z860100 Personal history of colon polyps, unspecified: Secondary | ICD-10-CM | POA: Diagnosis not present

## 2024-08-13 DIAGNOSIS — Z8719 Personal history of other diseases of the digestive system: Secondary | ICD-10-CM | POA: Diagnosis not present

## 2024-08-13 DIAGNOSIS — R42 Dizziness and giddiness: Secondary | ICD-10-CM | POA: Insufficient documentation

## 2024-08-13 DIAGNOSIS — Z823 Family history of stroke: Secondary | ICD-10-CM | POA: Insufficient documentation

## 2024-08-13 DIAGNOSIS — Z9049 Acquired absence of other specified parts of digestive tract: Secondary | ICD-10-CM | POA: Diagnosis not present

## 2024-08-13 DIAGNOSIS — Z833 Family history of diabetes mellitus: Secondary | ICD-10-CM | POA: Insufficient documentation

## 2024-08-13 DIAGNOSIS — R5383 Other fatigue: Secondary | ICD-10-CM | POA: Insufficient documentation

## 2024-08-13 LAB — IRON AND TIBC
Iron: 62 ug/dL (ref 28–170)
Saturation Ratios: 15 % (ref 10.4–31.8)
TIBC: 406 ug/dL (ref 250–450)
UIBC: 344 ug/dL

## 2024-08-13 LAB — CBC WITH DIFFERENTIAL/PLATELET
Abs Immature Granulocytes: 0.02 K/uL (ref 0.00–0.07)
Basophils Absolute: 0 K/uL (ref 0.0–0.1)
Basophils Relative: 1 %
Eosinophils Absolute: 0.2 K/uL (ref 0.0–0.5)
Eosinophils Relative: 2 %
HCT: 43.7 % (ref 36.0–46.0)
Hemoglobin: 13.5 g/dL (ref 12.0–15.0)
Immature Granulocytes: 0 %
Lymphocytes Relative: 40 %
Lymphs Abs: 3.1 K/uL (ref 0.7–4.0)
MCH: 24.3 pg — ABNORMAL LOW (ref 26.0–34.0)
MCHC: 30.9 g/dL (ref 30.0–36.0)
MCV: 78.7 fL — ABNORMAL LOW (ref 80.0–100.0)
Monocytes Absolute: 0.6 K/uL (ref 0.1–1.0)
Monocytes Relative: 8 %
Neutro Abs: 3.8 K/uL (ref 1.7–7.7)
Neutrophils Relative %: 49 %
Platelets: 357 K/uL (ref 150–400)
RBC: 5.55 MIL/uL — ABNORMAL HIGH (ref 3.87–5.11)
RDW: 18.2 % — ABNORMAL HIGH (ref 11.5–15.5)
WBC: 7.7 K/uL (ref 4.0–10.5)
nRBC: 0 % (ref 0.0–0.2)

## 2024-08-13 LAB — FERRITIN: Ferritin: 49 ng/mL (ref 11–307)

## 2024-08-13 LAB — COMPREHENSIVE METABOLIC PANEL WITH GFR
ALT: 17 U/L (ref 0–44)
AST: 21 U/L (ref 15–41)
Albumin: 4.6 g/dL (ref 3.5–5.0)
Alkaline Phosphatase: 81 U/L (ref 38–126)
Anion gap: 12 (ref 5–15)
BUN: 22 mg/dL (ref 8–23)
CO2: 25 mmol/L (ref 22–32)
Calcium: 9.7 mg/dL (ref 8.9–10.3)
Chloride: 107 mmol/L (ref 98–111)
Creatinine, Ser: 0.86 mg/dL (ref 0.44–1.00)
GFR, Estimated: >60 mL/min (ref >60)
Glucose, Bld: 128 mg/dL — ABNORMAL HIGH (ref 70–99)
Potassium: 4.3 mmol/L (ref 3.5–5.1)
Sodium: 144 mmol/L (ref 135–145)
Total Bilirubin: 0.3 mg/dL (ref 0.0–1.2)
Total Protein: 7.5 g/dL (ref 6.5–8.1)

## 2024-08-13 LAB — RETIC PANEL
Immature Retic Fract: 15.6 % (ref 2.3–15.9)
RBC.: 5.52 MIL/uL — ABNORMAL HIGH (ref 3.87–5.11)
Retic Count, Absolute: 115.9 K/uL (ref 19.0–186.0)
Retic Ct Pct: 2.1 % (ref 0.4–3.1)
Reticulocyte Hemoglobin: 25.8 pg — ABNORMAL LOW (ref >27.9)

## 2024-08-13 NOTE — Progress Notes (Signed)
 Acute Office Visit  Subjective:     Patient ID: [name], female    DOB: 1946-06-21, 77 y.o.   MRN: [id number]  Chief Complaint  Patient presents with   Follow-up    Patient is here for her second follow up from surgery. She states she is better but still in some stomach pain. She has an infusion after this appointment    HPI Patient is in today for a follow-up after her last appointment on 07/25/2024. Is still experiencing some RUQ pain while walking or doing activities, but reports it has improved since the last visit. Describes the pain as sharp, and is under her right ribcage. Has stopped the oxycodone  completely, and did not pick up the hydrocodone . Has been taking tylenol  as needed for pain. Patient reports that she followed up with surgeon since last visit, and said that it may take up to 12 weeks to heal and she is experiencing pain from scar tissue. Appetite and nausea has improved since last visit. Says that she is not able to eat as much as she was eating before, but is tolerating meals better and able to eat more solid foods. Reports that diarrhea has improved as well, and is having more normal bowel movements. Does have episodes of diarrhea occasionally, but is not as frequent as before.  She is to see hematology today, and is getting an iron  infusion. Reports that she is feeling very fatigued, and hopes that it helps with this.   Review of Systems  Constitutional:  Positive for fatigue. Negative for appetite change, chills, fever and unexpected weight change.  Respiratory:  Negative for cough, chest tightness, shortness of breath and wheezing.   Cardiovascular:  Negative for chest pain and palpitations.  Gastrointestinal:  Positive for abdominal pain. Negative for constipation, diarrhea, nausea and vomiting.       Objective:    Today's Vitals   08/13/24 0912  BP: 127/61  Pulse: 77  Temp: (!) 97.5 F (36.4 C)  SpO2: 98%  Weight: 241 lb 6 oz (109.5 kg)   Height: 5' 4 (1.626 m)   Body mass index is 41.43 kg/m.   Physical Exam Constitutional:      General: She is not in acute distress.    Appearance: Normal appearance. She is not ill-appearing.  Cardiovascular:     Rate and Rhythm: Normal rate and regular rhythm.     Heart sounds: Normal heart sounds, S1 normal and S2 normal. No murmur heard. Pulmonary:     Effort: Pulmonary effort is normal. No respiratory distress.     Breath sounds: Normal breath sounds. No wheezing.  Abdominal:     General: A surgical scar is present. There is no distension.     Tenderness: There is abdominal tenderness in the right upper quadrant. There is no guarding.     Comments: No obvious masses or abnormalities palpated on exam.   Neurological:     Mental Status: She is alert.  Psychiatric:        Mood and Affect: Mood normal.        Behavior: Behavior normal.        Thought Content: Thought content normal.        Judgment: Judgment normal.    Assessment & Plan:  1. RUQ pain (Primary) -Discussed treatment plan with patient. I do agree with her surgeon as I think she is experiencing pain from scar tissue. Talked with patient and informed her that she did have major surgery,  and this will take some time to heal. Patient asked if she will need another ultrasound, and I do not warrant further imaging at this time. As long as she is improving as far as appetite or nausea, and pain is improving I am okay with conservative management at this time.  -Educated patient about signs of infection, and advised her that if she experiences increased nausea, abdominal pain, fever, or vomiting to get seek medical attention immediately.  -Advised patient to continue taking tylenol  as needed for pain.  -Spoke with Dr. Glendia about patient, and he agrees with plan.   2. Iron  deficiency anemia, unspecified iron  deficiency anemia type -Patient to follow up with hematology for management of this at this time. Advised patient  to get iron  infusion today and should start seeing improvement in fatigue. Patient will follow-up if she has any concerns.   Return in about 4 months (around 12/12/2024), or if symptoms worsen or fail to improve.  Damien KATHEE Pringle, FNP

## 2024-08-14 ENCOUNTER — Inpatient Hospital Stay

## 2024-08-14 ENCOUNTER — Encounter: Payer: Self-pay | Admitting: Physician Assistant

## 2024-08-14 VITALS — BP 141/53 | HR 56 | Temp 97.4°F | Resp 18

## 2024-08-14 DIAGNOSIS — E611 Iron deficiency: Secondary | ICD-10-CM | POA: Diagnosis not present

## 2024-08-14 MED ORDER — SODIUM CHLORIDE 0.9 % IV SOLN: INTRAVENOUS | Status: DC

## 2024-08-14 MED ORDER — FAMOTIDINE 20 MG PO TABS
20.0000 mg | ORAL_TABLET | Freq: Once | ORAL | Status: AC
  Administered 2024-08-14: 14:00:00 20 mg via ORAL
  Filled 2024-08-14: qty 1

## 2024-08-14 MED ORDER — IRON SUCROSE 200 MG IVPB - SIMPLE MED
200.0000 mg | Freq: Once | Status: AC
  Administered 2024-08-14: 15:00:00 200 mg via INTRAVENOUS
  Filled 2024-08-14: qty 10

## 2024-08-14 MED ORDER — ACETAMINOPHEN 325 MG PO TABS
650.0000 mg | ORAL_TABLET | Freq: Once | ORAL | Status: AC
  Administered 2024-08-14: 14:00:00 650 mg via ORAL
  Filled 2024-08-14: qty 2

## 2024-08-14 MED ORDER — CETIRIZINE HCL 10 MG PO TABS
10.0000 mg | ORAL_TABLET | Freq: Once | ORAL | Status: AC
  Administered 2024-08-14: 14:00:00 10 mg via ORAL
  Filled 2024-08-14: qty 1

## 2024-08-14 NOTE — Telephone Encounter (Signed)
-----   Message from Johnston ONEIDA Police sent at 08/13/2024 12:39 PM EST ----- Can you request a follow up with GI (Dr. Cindie) due to iron  deficiency and issues with frequent post prandial BM/diarrhea.   Thanks, Johnston

## 2024-08-14 NOTE — Telephone Encounter (Signed)
 Patient contacted and recommended follow up with Dr Cindie.  States that she will reach out to office today to make an appointment.

## 2024-08-14 NOTE — Progress Notes (Signed)
 Patient tolerated iron infusion with no complaints voiced.  Peripheral IV site clean and dry with good blood return noted before and after infusion.  Band aid applied.  VSS with discharge and left in satisfactory condition with no s/s of distress noted.

## 2024-08-14 NOTE — Patient Instructions (Signed)

## 2024-08-17 NOTE — Telephone Encounter (Signed)
 error

## 2024-08-21 ENCOUNTER — Inpatient Hospital Stay

## 2024-08-21 VITALS — BP 163/47 | HR 69 | Temp 96.9°F | Resp 20

## 2024-08-21 DIAGNOSIS — E611 Iron deficiency: Secondary | ICD-10-CM

## 2024-08-21 MED ORDER — SODIUM CHLORIDE 0.9 % IV SOLN: INTRAVENOUS | Status: DC

## 2024-08-21 MED ORDER — IRON SUCROSE 200 MG IVPB - SIMPLE MED
200.0000 mg | Freq: Once | Status: AC
  Administered 2024-08-21: 200 mg via INTRAVENOUS
  Filled 2024-08-21: qty 110

## 2024-08-21 MED ORDER — CETIRIZINE HCL 10 MG PO TABS
10.0000 mg | ORAL_TABLET | Freq: Once | ORAL | Status: AC
  Administered 2024-08-21: 10 mg via ORAL
  Filled 2024-08-21: qty 1

## 2024-08-21 MED ORDER — FAMOTIDINE 20 MG PO TABS
20.0000 mg | ORAL_TABLET | Freq: Once | ORAL | Status: AC
  Administered 2024-08-21: 20 mg via ORAL
  Filled 2024-08-21: qty 1

## 2024-08-21 MED ORDER — ACETAMINOPHEN 325 MG PO TABS
650.0000 mg | ORAL_TABLET | Freq: Once | ORAL | Status: AC
  Administered 2024-08-21: 650 mg via ORAL
  Filled 2024-08-21: qty 2

## 2024-08-21 NOTE — Patient Instructions (Signed)

## 2024-08-21 NOTE — Progress Notes (Signed)
 Patient presents today for iron  infusion.  Patient is in satisfactory condition with no new complaints voiced.  Vital signs are stable.  We will proceed with infusion per provider orders.    Peripheral IV started with good blood return pre and post infusion.  Treatment given today per MD orders. Tolerated infusion without adverse affects. Vital signs stable. No complaints at this time. Discharged from clinic ambulatory with cane in stable condition. Alert and oriented x 3. F/U with [facility] as scheduled.

## 2024-08-28 ENCOUNTER — Inpatient Hospital Stay

## 2024-08-28 VITALS — BP 145/51 | HR 42 | Temp 96.8°F | Resp 16

## 2024-08-28 DIAGNOSIS — E611 Iron deficiency: Secondary | ICD-10-CM

## 2024-08-28 MED ORDER — IRON SUCROSE 200 MG IVPB - SIMPLE MED
200.0000 mg | Freq: Once | Status: AC
  Administered 2024-08-28: 200 mg via INTRAVENOUS
  Filled 2024-08-28: qty 200

## 2024-08-28 MED ORDER — FAMOTIDINE 20 MG PO TABS
20.0000 mg | ORAL_TABLET | Freq: Once | ORAL | Status: AC
  Administered 2024-08-28: 20 mg via ORAL
  Filled 2024-08-28: qty 1

## 2024-08-28 MED ORDER — ACETAMINOPHEN 325 MG PO TABS
650.0000 mg | ORAL_TABLET | Freq: Once | ORAL | Status: AC
  Administered 2024-08-28: 650 mg via ORAL
  Filled 2024-08-28: qty 2

## 2024-08-28 MED ORDER — CETIRIZINE HCL 10 MG PO TABS
10.0000 mg | ORAL_TABLET | Freq: Once | ORAL | Status: AC
  Administered 2024-08-28: 10 mg via ORAL
  Filled 2024-08-28: qty 1

## 2024-08-28 MED ORDER — SODIUM CHLORIDE 0.9 % IV SOLN: INTRAVENOUS | Status: DC

## 2024-08-28 NOTE — Progress Notes (Signed)
Iron infusion given per orders. Patient tolerated it well without problems. Vitals stable and discharged home from clinic ambulatory. Follow up as scheduled.  

## 2024-08-28 NOTE — Patient Instructions (Signed)

## 2024-08-28 NOTE — Progress Notes (Signed)
 Patient presents today for Venofer  200 mg iron  infusion. Vital signs stable. Patient denies any side effects related to her last iron  infusion. No concerns or questions noted today.

## 2024-08-30 ENCOUNTER — Encounter: Payer: Self-pay | Admitting: Gastroenterology

## 2024-09-10 ENCOUNTER — Other Ambulatory Visit: Payer: Self-pay | Admitting: Cardiology

## 2024-09-11 ENCOUNTER — Other Ambulatory Visit: Payer: Self-pay | Admitting: Family Medicine

## 2024-10-02 ENCOUNTER — Other Ambulatory Visit: Payer: Self-pay

## 2024-10-02 ENCOUNTER — Inpatient Hospital Stay: Attending: Physician Assistant

## 2024-10-02 DIAGNOSIS — E611 Iron deficiency: Secondary | ICD-10-CM

## 2024-10-09 ENCOUNTER — Inpatient Hospital Stay: Admitting: Oncology

## 2024-11-08 ENCOUNTER — Inpatient Hospital Stay: Attending: Physician Assistant

## 2024-11-15 ENCOUNTER — Inpatient Hospital Stay: Admitting: Oncology

## 2024-12-13 ENCOUNTER — Ambulatory Visit: Admitting: Family Medicine
